# Patient Record
Sex: Male | Born: 1935 | Race: White | Hispanic: No | Marital: Married | Smoking: Current every day smoker
Health system: Southern US, Community
[De-identification: ages and names within clinical notes are randomized; demographics above are authoritative.]

## PROBLEM LIST (undated history)

## (undated) DIAGNOSIS — I219 Acute myocardial infarction, unspecified: Secondary | ICD-10-CM

## (undated) DIAGNOSIS — E039 Hypothyroidism, unspecified: Secondary | ICD-10-CM

## (undated) DIAGNOSIS — M549 Dorsalgia, unspecified: Secondary | ICD-10-CM

## (undated) DIAGNOSIS — A419 Sepsis, unspecified organism: Secondary | ICD-10-CM

## (undated) DIAGNOSIS — M779 Enthesopathy, unspecified: Secondary | ICD-10-CM

## (undated) DIAGNOSIS — G8929 Other chronic pain: Secondary | ICD-10-CM

## (undated) DIAGNOSIS — K219 Gastro-esophageal reflux disease without esophagitis: Secondary | ICD-10-CM

## (undated) DIAGNOSIS — L8990 Pressure ulcer of unspecified site, unspecified stage: Secondary | ICD-10-CM

## (undated) DIAGNOSIS — R1310 Dysphagia, unspecified: Secondary | ICD-10-CM

## (undated) DIAGNOSIS — M109 Gout, unspecified: Secondary | ICD-10-CM

## (undated) DIAGNOSIS — M898X9 Other specified disorders of bone, unspecified site: Secondary | ICD-10-CM

## (undated) DIAGNOSIS — F32A Depression, unspecified: Secondary | ICD-10-CM

## (undated) DIAGNOSIS — E119 Type 2 diabetes mellitus without complications: Secondary | ICD-10-CM

## (undated) DIAGNOSIS — R7989 Other specified abnormal findings of blood chemistry: Secondary | ICD-10-CM

## (undated) DIAGNOSIS — J189 Pneumonia, unspecified organism: Secondary | ICD-10-CM

## (undated) DIAGNOSIS — N179 Acute kidney failure, unspecified: Secondary | ICD-10-CM

## (undated) DIAGNOSIS — I10 Essential (primary) hypertension: Secondary | ICD-10-CM

## (undated) DIAGNOSIS — M21372 Foot drop, left foot: Secondary | ICD-10-CM

## (undated) DIAGNOSIS — G4700 Insomnia, unspecified: Secondary | ICD-10-CM

## (undated) DIAGNOSIS — D126 Benign neoplasm of colon, unspecified: Secondary | ICD-10-CM

## (undated) DIAGNOSIS — R296 Repeated falls: Secondary | ICD-10-CM

## (undated) DIAGNOSIS — R3129 Other microscopic hematuria: Secondary | ICD-10-CM

## (undated) DIAGNOSIS — G9340 Encephalopathy, unspecified: Secondary | ICD-10-CM

## (undated) DIAGNOSIS — F329 Major depressive disorder, single episode, unspecified: Secondary | ICD-10-CM

## (undated) DIAGNOSIS — B342 Coronavirus infection, unspecified: Secondary | ICD-10-CM

## (undated) DIAGNOSIS — E079 Disorder of thyroid, unspecified: Secondary | ICD-10-CM

## (undated) DIAGNOSIS — F419 Anxiety disorder, unspecified: Secondary | ICD-10-CM

## (undated) HISTORY — DX: Pressure ulcer of unspecified site, unspecified stage: L89.90

## (undated) HISTORY — PX: BACK SURGERY: SHX140

## (undated) HISTORY — PX: APPENDECTOMY: SHX54

## (undated) HISTORY — DX: Pneumonia, unspecified organism: J18.9

## (undated) HISTORY — DX: Encephalopathy, unspecified: G93.40

## (undated) HISTORY — DX: Acute kidney failure, unspecified: N17.9

## (undated) HISTORY — PX: CHOLECYSTECTOMY: SHX55

## (undated) HISTORY — DX: Sepsis, unspecified organism: A41.9

## (undated) HISTORY — PX: EYE SURGERY: SHX253

---

## 1997-09-09 ENCOUNTER — Encounter: Admission: RE | Admit: 1997-09-09 | Discharge: 1997-09-09 | Payer: Self-pay | Admitting: *Deleted

## 2003-11-11 ENCOUNTER — Ambulatory Visit: Payer: Self-pay | Admitting: Pain Medicine

## 2003-12-07 ENCOUNTER — Ambulatory Visit: Payer: Self-pay | Admitting: Physician Assistant

## 2004-01-07 ENCOUNTER — Ambulatory Visit: Payer: Self-pay | Admitting: Physician Assistant

## 2004-02-04 ENCOUNTER — Ambulatory Visit: Payer: Self-pay | Admitting: Physician Assistant

## 2004-03-08 ENCOUNTER — Ambulatory Visit: Payer: Self-pay | Admitting: Physician Assistant

## 2004-04-11 ENCOUNTER — Ambulatory Visit: Payer: Self-pay | Admitting: Physician Assistant

## 2004-05-09 ENCOUNTER — Ambulatory Visit: Payer: Self-pay | Admitting: Physician Assistant

## 2004-06-09 ENCOUNTER — Ambulatory Visit: Payer: Self-pay | Admitting: Physician Assistant

## 2004-07-07 ENCOUNTER — Ambulatory Visit: Payer: Self-pay | Admitting: Physician Assistant

## 2004-08-05 ENCOUNTER — Ambulatory Visit: Payer: Self-pay | Admitting: Physician Assistant

## 2004-09-06 ENCOUNTER — Ambulatory Visit: Payer: Self-pay | Admitting: Physician Assistant

## 2004-10-06 ENCOUNTER — Ambulatory Visit: Payer: Self-pay | Admitting: Physician Assistant

## 2004-11-08 ENCOUNTER — Ambulatory Visit: Payer: Self-pay | Admitting: Physician Assistant

## 2004-12-08 ENCOUNTER — Ambulatory Visit: Payer: Self-pay | Admitting: Physician Assistant

## 2005-01-02 ENCOUNTER — Ambulatory Visit: Payer: Self-pay | Admitting: Physician Assistant

## 2005-01-26 ENCOUNTER — Ambulatory Visit: Payer: Self-pay | Admitting: Physician Assistant

## 2005-03-07 ENCOUNTER — Ambulatory Visit: Payer: Self-pay | Admitting: Physician Assistant

## 2005-04-03 ENCOUNTER — Ambulatory Visit: Payer: Self-pay | Admitting: Physician Assistant

## 2005-05-02 ENCOUNTER — Ambulatory Visit: Payer: Self-pay | Admitting: Physician Assistant

## 2005-06-05 ENCOUNTER — Ambulatory Visit: Payer: Self-pay | Admitting: Physician Assistant

## 2005-07-04 ENCOUNTER — Ambulatory Visit: Payer: Self-pay | Admitting: Physician Assistant

## 2005-08-03 ENCOUNTER — Ambulatory Visit: Payer: Self-pay | Admitting: Physician Assistant

## 2005-08-31 ENCOUNTER — Ambulatory Visit: Payer: Self-pay | Admitting: Physician Assistant

## 2005-09-28 ENCOUNTER — Ambulatory Visit: Payer: Self-pay | Admitting: Physician Assistant

## 2005-10-30 ENCOUNTER — Ambulatory Visit: Payer: Self-pay | Admitting: Physician Assistant

## 2005-11-28 ENCOUNTER — Ambulatory Visit: Payer: Self-pay | Admitting: Physician Assistant

## 2005-12-25 ENCOUNTER — Ambulatory Visit: Payer: Self-pay | Admitting: Physician Assistant

## 2006-01-24 ENCOUNTER — Ambulatory Visit: Payer: Self-pay | Admitting: Physician Assistant

## 2006-02-27 ENCOUNTER — Ambulatory Visit: Payer: Self-pay | Admitting: Physician Assistant

## 2006-03-29 ENCOUNTER — Ambulatory Visit: Payer: Self-pay | Admitting: Physician Assistant

## 2006-04-26 ENCOUNTER — Ambulatory Visit: Payer: Self-pay | Admitting: Physician Assistant

## 2006-05-25 ENCOUNTER — Ambulatory Visit: Payer: Self-pay | Admitting: Internal Medicine

## 2006-05-25 ENCOUNTER — Other Ambulatory Visit: Payer: Self-pay

## 2006-05-28 ENCOUNTER — Ambulatory Visit: Payer: Self-pay | Admitting: Physician Assistant

## 2006-05-30 ENCOUNTER — Ambulatory Visit: Payer: Self-pay | Admitting: Unknown Physician Specialty

## 2006-06-28 ENCOUNTER — Ambulatory Visit: Payer: Self-pay | Admitting: Physician Assistant

## 2006-07-26 ENCOUNTER — Ambulatory Visit: Payer: Self-pay | Admitting: Physician Assistant

## 2006-08-27 ENCOUNTER — Ambulatory Visit: Payer: Self-pay | Admitting: Physician Assistant

## 2006-09-24 ENCOUNTER — Ambulatory Visit: Payer: Self-pay | Admitting: Physician Assistant

## 2006-10-17 ENCOUNTER — Ambulatory Visit: Payer: Self-pay | Admitting: Physician Assistant

## 2006-11-26 ENCOUNTER — Ambulatory Visit: Payer: Self-pay | Admitting: Physician Assistant

## 2006-12-24 ENCOUNTER — Ambulatory Visit: Payer: Self-pay | Admitting: Physician Assistant

## 2007-01-22 ENCOUNTER — Ambulatory Visit: Payer: Self-pay | Admitting: Physician Assistant

## 2007-04-23 ENCOUNTER — Ambulatory Visit: Payer: Self-pay | Admitting: Physician Assistant

## 2007-07-23 ENCOUNTER — Ambulatory Visit: Payer: Self-pay | Admitting: Physician Assistant

## 2007-10-22 ENCOUNTER — Ambulatory Visit: Payer: Self-pay | Admitting: Physician Assistant

## 2008-01-16 ENCOUNTER — Ambulatory Visit: Payer: Self-pay | Admitting: Physician Assistant

## 2008-04-15 ENCOUNTER — Ambulatory Visit: Payer: Self-pay | Admitting: Physician Assistant

## 2008-07-15 ENCOUNTER — Ambulatory Visit: Payer: Self-pay | Admitting: Physician Assistant

## 2008-09-07 ENCOUNTER — Emergency Department: Payer: Self-pay | Admitting: Internal Medicine

## 2008-10-13 ENCOUNTER — Ambulatory Visit: Payer: Self-pay | Admitting: Physician Assistant

## 2008-11-16 ENCOUNTER — Ambulatory Visit: Payer: Self-pay | Admitting: Pain Medicine

## 2008-11-25 ENCOUNTER — Ambulatory Visit: Payer: Self-pay | Admitting: Internal Medicine

## 2009-02-08 ENCOUNTER — Ambulatory Visit: Payer: Self-pay | Admitting: Pain Medicine

## 2009-02-15 ENCOUNTER — Ambulatory Visit: Payer: Self-pay | Admitting: Unknown Physician Specialty

## 2009-03-29 ENCOUNTER — Ambulatory Visit: Payer: Self-pay | Admitting: Unknown Physician Specialty

## 2009-04-12 ENCOUNTER — Ambulatory Visit: Payer: Self-pay | Admitting: Unknown Physician Specialty

## 2009-04-26 ENCOUNTER — Ambulatory Visit: Payer: Self-pay | Admitting: Unknown Physician Specialty

## 2009-05-10 ENCOUNTER — Ambulatory Visit: Payer: Self-pay | Admitting: Pain Medicine

## 2012-11-19 ENCOUNTER — Ambulatory Visit: Payer: Self-pay | Admitting: Internal Medicine

## 2012-12-07 ENCOUNTER — Ambulatory Visit: Payer: Self-pay | Admitting: Internal Medicine

## 2013-01-06 ENCOUNTER — Ambulatory Visit: Payer: Self-pay | Admitting: Internal Medicine

## 2014-02-06 DIAGNOSIS — R3129 Other microscopic hematuria: Secondary | ICD-10-CM

## 2014-02-06 HISTORY — DX: Other microscopic hematuria: R31.29

## 2014-12-07 ENCOUNTER — Emergency Department: Payer: Medicare Other

## 2014-12-07 ENCOUNTER — Inpatient Hospital Stay
Admission: EM | Admit: 2014-12-07 | Discharge: 2014-12-09 | DRG: 871 | Disposition: A | Discharge status: Home / Self Care | Payer: Medicare Other | Attending: Internal Medicine | Admitting: Internal Medicine

## 2014-12-07 DIAGNOSIS — Z79899 Other long term (current) drug therapy: Secondary | ICD-10-CM | POA: Diagnosis not present

## 2014-12-07 DIAGNOSIS — E860 Dehydration: Secondary | ICD-10-CM | POA: Diagnosis present

## 2014-12-07 DIAGNOSIS — Z882 Allergy status to sulfonamides status: Secondary | ICD-10-CM

## 2014-12-07 DIAGNOSIS — N179 Acute kidney failure, unspecified: Secondary | ICD-10-CM | POA: Diagnosis present

## 2014-12-07 DIAGNOSIS — M109 Gout, unspecified: Secondary | ICD-10-CM | POA: Diagnosis present

## 2014-12-07 DIAGNOSIS — E039 Hypothyroidism, unspecified: Secondary | ICD-10-CM | POA: Diagnosis present

## 2014-12-07 DIAGNOSIS — J189 Pneumonia, unspecified organism: Secondary | ICD-10-CM

## 2014-12-07 DIAGNOSIS — R4781 Slurred speech: Secondary | ICD-10-CM

## 2014-12-07 DIAGNOSIS — G9341 Metabolic encephalopathy: Secondary | ICD-10-CM | POA: Diagnosis present

## 2014-12-07 DIAGNOSIS — E119 Type 2 diabetes mellitus without complications: Secondary | ICD-10-CM | POA: Diagnosis present

## 2014-12-07 DIAGNOSIS — A419 Sepsis, unspecified organism: Principal | ICD-10-CM | POA: Diagnosis present

## 2014-12-07 DIAGNOSIS — Z8249 Family history of ischemic heart disease and other diseases of the circulatory system: Secondary | ICD-10-CM

## 2014-12-07 DIAGNOSIS — K219 Gastro-esophageal reflux disease without esophagitis: Secondary | ICD-10-CM | POA: Diagnosis present

## 2014-12-07 DIAGNOSIS — L8990 Pressure ulcer of unspecified site, unspecified stage: Secondary | ICD-10-CM | POA: Diagnosis present

## 2014-12-07 DIAGNOSIS — F17200 Nicotine dependence, unspecified, uncomplicated: Secondary | ICD-10-CM | POA: Diagnosis present

## 2014-12-07 DIAGNOSIS — Z833 Family history of diabetes mellitus: Secondary | ICD-10-CM | POA: Diagnosis not present

## 2014-12-07 DIAGNOSIS — I639 Cerebral infarction, unspecified: Secondary | ICD-10-CM | POA: Diagnosis not present

## 2014-12-07 DIAGNOSIS — G9340 Encephalopathy, unspecified: Secondary | ICD-10-CM | POA: Diagnosis present

## 2014-12-07 HISTORY — DX: Gout, unspecified: M10.9

## 2014-12-07 HISTORY — DX: Disorder of thyroid, unspecified: E07.9

## 2014-12-07 HISTORY — DX: Dorsalgia, unspecified: M54.9

## 2014-12-07 HISTORY — DX: Gastro-esophageal reflux disease without esophagitis: K21.9

## 2014-12-07 HISTORY — DX: Insomnia, unspecified: G47.00

## 2014-12-07 HISTORY — DX: Pneumonia, unspecified organism: J18.9

## 2014-12-07 HISTORY — DX: Type 2 diabetes mellitus without complications: E11.9

## 2014-12-07 HISTORY — DX: Anxiety disorder, unspecified: F41.9

## 2014-12-07 LAB — CBC WITH DIFFERENTIAL/PLATELET
Basophils Absolute: 0.1 10*3/uL (ref 0–0.1)
Basophils Relative: 0 %
Eosinophils Absolute: 0.3 10*3/uL (ref 0–0.7)
Eosinophils Relative: 2 %
HCT: 51.7 % (ref 40.0–52.0)
Hemoglobin: 17.6 g/dL (ref 13.0–18.0)
Lymphocytes Relative: 4 %
Lymphs Abs: 0.8 10*3/uL — ABNORMAL LOW (ref 1.0–3.6)
MCH: 30.9 pg (ref 26.0–34.0)
MCHC: 34 g/dL (ref 32.0–36.0)
MCV: 90.8 fL (ref 80.0–100.0)
MONO ABS: 0.8 10*3/uL (ref 0.2–1.0)
Monocytes Relative: 5 %
Neutro Abs: 16.5 10*3/uL — ABNORMAL HIGH (ref 1.4–6.5)
Neutrophils Relative %: 89 %
Platelets: 157 10*3/uL (ref 150–440)
RBC: 5.7 MIL/uL (ref 4.40–5.90)
RDW: 14.2 % (ref 11.5–14.5)
WBC: 18.5 10*3/uL — ABNORMAL HIGH (ref 3.8–10.6)

## 2014-12-07 LAB — COMPREHENSIVE METABOLIC PANEL
ALT: 43 U/L (ref 17–63)
AST: 60 U/L — AB (ref 15–41)
Albumin: 4.1 g/dL (ref 3.5–5.0)
Alkaline Phosphatase: 145 U/L — ABNORMAL HIGH (ref 38–126)
Anion gap: 10 (ref 5–15)
BUN: 30 mg/dL — AB (ref 6–20)
CHLORIDE: 101 mmol/L (ref 101–111)
CO2: 31 mmol/L (ref 22–32)
CREATININE: 1.38 mg/dL — AB (ref 0.61–1.24)
Calcium: 9.3 mg/dL (ref 8.9–10.3)
GFR calc Af Amer: 55 mL/min — ABNORMAL LOW (ref >60)
GFR calc non Af Amer: 47 mL/min — ABNORMAL LOW (ref >60)
GLUCOSE: 145 mg/dL — AB (ref 65–99)
POTASSIUM: 4.5 mmol/L (ref 3.5–5.1)
Sodium: 142 mmol/L (ref 135–145)
Total Bilirubin: 1.1 mg/dL (ref 0.3–1.2)
Total Protein: 7.8 g/dL (ref 6.5–8.1)

## 2014-12-07 LAB — TROPONIN I: Troponin I: 0.03 ng/mL (ref <0.031)

## 2014-12-07 MED ORDER — RISPERIDONE 0.25 MG PO TABS
0.2500 mg | ORAL_TABLET | Freq: Every day | ORAL | Status: DC
  Administered 2014-12-08 (×2): 0.25 mg via ORAL
  Filled 2014-12-07 (×3): qty 1

## 2014-12-07 MED ORDER — LEVOTHYROXINE SODIUM 75 MCG PO TABS
150.0000 ug | ORAL_TABLET | Freq: Every morning | ORAL | Status: DC
Start: 2014-12-08 — End: 2014-12-09
  Administered 2014-12-08 – 2014-12-09 (×2): 150 ug via ORAL
  Filled 2014-12-07 (×2): qty 2

## 2014-12-07 MED ORDER — LEVALBUTEROL HCL 1.25 MG/0.5ML IN NEBU
1.2500 mg | INHALATION_SOLUTION | Freq: Four times a day (QID) | RESPIRATORY_TRACT | Status: DC | PRN
  Filled 2014-12-07: qty 0.5

## 2014-12-07 MED ORDER — INSULIN ASPART 100 UNIT/ML ~~LOC~~ SOLN
0.0000 [IU] | Freq: Three times a day (TID) | SUBCUTANEOUS | Status: DC
  Administered 2014-12-08 – 2014-12-09 (×2): 1 [IU] via SUBCUTANEOUS
  Filled 2014-12-07 (×2): qty 1

## 2014-12-07 MED ORDER — GABAPENTIN 600 MG PO TABS
600.0000 mg | ORAL_TABLET | Freq: Four times a day (QID) | ORAL | Status: DC
  Administered 2014-12-08 – 2014-12-09 (×7): 600 mg via ORAL
  Filled 2014-12-07 (×8): qty 1

## 2014-12-07 MED ORDER — ALLOPURINOL 100 MG PO TABS
100.0000 mg | ORAL_TABLET | Freq: Every day | ORAL | Status: DC
  Administered 2014-12-08 – 2014-12-09 (×2): 100 mg via ORAL
  Filled 2014-12-07 (×2): qty 1

## 2014-12-07 MED ORDER — MIRTAZAPINE 15 MG PO TABS
45.0000 mg | ORAL_TABLET | Freq: Every day | ORAL | Status: DC
  Administered 2014-12-08 (×2): 45 mg via ORAL
  Filled 2014-12-07 (×2): qty 3

## 2014-12-07 MED ORDER — SODIUM CHLORIDE 0.9 % IV SOLN
INTRAVENOUS | Status: DC
  Administered 2014-12-08 – 2014-12-09 (×2): via INTRAVENOUS

## 2014-12-07 MED ORDER — ENOXAPARIN SODIUM 40 MG/0.4ML ~~LOC~~ SOLN
40.0000 mg | SUBCUTANEOUS | Status: DC
  Administered 2014-12-08 (×2): 40 mg via SUBCUTANEOUS
  Filled 2014-12-07 (×2): qty 0.4

## 2014-12-07 MED ORDER — INSULIN ASPART 100 UNIT/ML ~~LOC~~ SOLN: 0.0000 [IU] | Freq: Every day | SUBCUTANEOUS | Status: DC

## 2014-12-07 MED ORDER — SODIUM CHLORIDE 0.9 % IV BOLUS (SEPSIS)
1000.0000 mL | Freq: Once | INTRAVENOUS | Status: AC
  Administered 2014-12-08: 1000 mL via INTRAVENOUS

## 2014-12-07 MED ORDER — LEVOFLOXACIN IN D5W 750 MG/150ML IV SOLN
750.0000 mg | INTRAVENOUS | Status: DC
  Filled 2014-12-07: qty 150

## 2014-12-07 MED ORDER — METHADONE HCL 10 MG PO TABS
10.0000 mg | ORAL_TABLET | Freq: Three times a day (TID) | ORAL | Status: DC | PRN
  Administered 2014-12-08: 10 mg via ORAL
  Filled 2014-12-07: qty 1

## 2014-12-07 NOTE — ED Notes (Signed)
Tele neurologist consult now. Wife at bedside.

## 2014-12-07 NOTE — H&P (Addendum)
[place] [facility] [place] NAME: Chase Mendez    MR#:  [id number]  DATE OF BIRTH:  [birth year]  DATE OF ADMISSION:  12/07/2014  PRIMARY CARE PHYSICIAN: No primary care provider on file.   REQUESTING/REFERRING PHYSICIAN: Nance Pear, MD  CHIEF COMPLAINT:   Chief Complaint  Patient presents with  . Altered Mental Status   Confusion today. HISTORY OF PRESENT ILLNESS:  Chase Mendez  is a 80 y.o. male with a known history of DM, back pain, hypothyroidism. The patient was brought to the ED due to confusion one hour prior to the patient's arrival E ED this afternoon. The patient is confused and unable to provide detailed information. According to patient's wife, patient has had a cough for the past 2 weeks and a worsening for the past 2 days. The patient was found confusion, weakness and the mumbling speech this afternoon and then was sent to the ED for further evaluation. The patient denies any symptoms. WBC is 18.5. Chest x-ray show left sided pneumonia. He was treated with Levaquin in the ED.  PAST MEDICAL HISTORY:   Past Medical History  Diagnosis Date  . Gout   . Back pain   . Diabetes mellitus without complication ([place])   . Thyroid disease   . GERD (gastroesophageal reflux disease)   . Anxiety   . Insomnia     PAST SURGICAL HISTORY:   Past Surgical History  Procedure Laterality Date  . Cholecystectomy    . Appendectomy    . Back surgery    . Eye surgery      SOCIAL HISTORY:   Social History  Substance Use Topics  . Smoking status: Current Every Day Smoker    Types: Cigars  . Smokeless tobacco: Never Used  . Alcohol Use: No    FAMILY HISTORY:   Family History  Problem Relation Age of Onset  . Heart disease Mother   . Diabetes Father     DRUG ALLERGIES:   Allergies  Allergen Reactions  . Sulfa Antibiotics     REVIEW OF SYSTEMS:  CONSTITUTIONAL: No fever, fatigue or weakness.  EYES: No blurred or  double vision.  EARS, NOSE, AND THROAT: No tinnitus or ear pain.  RESPIRATORY: No cough, shortness of breath, wheezing or hemoptysis.  CARDIOVASCULAR: No chest pain, orthopnea, edema.  GASTROINTESTINAL: No nausea, vomiting, diarrhea or abdominal pain.  GENITOURINARY: No dysuria, hematuria.  ENDOCRINE: No polyuria, nocturia,  HEMATOLOGY: No anemia, easy bruising or bleeding SKIN: No rash or lesion. MUSCULOSKELETAL: No joint pain or arthritis.   NEUROLOGIC: No tingling, numbness, weakness.  PSYCHIATRY: No anxiety or depression.   MEDICATIONS AT HOME:   Prior to Admission medications   Medication Sig Start Date End Date Taking? Authorizing Provider  allopurinol (ZYLOPRIM) 100 MG tablet Take 100 mg by mouth daily. 10/26/14  Yes Historical Provider, MD  gabapentin (NEURONTIN) 600 MG tablet Take 600 mg by mouth 4 (four) times daily. 11/26/14  Yes Historical Provider, MD  levothyroxine (SYNTHROID, LEVOTHROID) 150 MCG tablet Take 150 mcg by mouth every morning. 09/12/14  Yes Historical Provider, MD  metFORMIN (GLUCOPHAGE) 500 MG tablet Take 500 mg by mouth every evening. 09/18/14  Yes Historical Provider, MD  methadone (DOLOPHINE) 10 MG tablet Take 1 tablet by mouth every 8 (eight) hours as needed. 11/28/14  Yes Historical Provider, MD  mirtazapine (REMERON) 45 MG tablet Take 1 tablet by mouth at bedtime. 11/26/14  Yes Historical Provider, MD  naproxen (NAPROSYN) 500 MG  tablet Take 1 tablet by mouth 2 (two) times daily. 11/24/14  Yes Historical Provider, MD  risperiDONE (RISPERDAL) 0.25 MG tablet Take 0.25 mg by mouth at bedtime. 11/26/14  Yes Historical Provider, MD      VITAL SIGNS:  Blood pressure 110/61, pulse 89, temperature 98 F (36.7 C), temperature source Oral, resp. rate 26, height 6\' 1"  (1.854 m), SpO2 91 %.  PHYSICAL EXAMINATION:  GENERAL:  80 y.o.-year-old patient lying in the bed with no acute distress.  EYES: Pupils equal, round, reactive to light and accommodation. No scleral  icterus. Extraocular muscles intact.  HEENT: Head atraumatic, normocephalic. Oropharynx and nasopharynx clear. Dry oral mucosa. NECK:  Supple, no jugular venous distention. No thyroid enlargement, no tenderness.  LUNGS: Coarse breath sounds bilaterally, bilateral basilar crackles. No use of accessory muscles of respiration.  CARDIOVASCULAR: S1, S2 normal. No murmurs, rubs, or gallops.  ABDOMEN: Soft, nontender, nondistended. Bowel sounds present. No organomegaly or mass.  EXTREMITIES: No pedal edema, cyanosis, or clubbing.  NEUROLOGIC: Cranial nerves II through XII are intact. Muscle strength 5/5 in all extremities. Sensation intact. Gait not checked.  PSYCHIATRIC: The patient is alert and oriented x 2-3.  SKIN: No obvious rash, lesion, or ulcer.   LABORATORY PANEL:   CBC  Recent Labs Lab 12/07/14 2117  WBC 18.5*  HGB 17.6  HCT 51.7  PLT 157   ------------------------------------------------------------------------------------------------------------------  Chemistries   Recent Labs Lab 12/07/14 2117  NA 142  K 4.5  CL 101  CO2 31  GLUCOSE 145*  BUN 30*  CREATININE 1.38*  CALCIUM 9.3  AST 60*  ALT 43  ALKPHOS 145*  BILITOT 1.1   ------------------------------------------------------------------------------------------------------------------  Cardiac Enzymes  Recent Labs Lab 12/07/14 2117  TROPONINI 0.03   ------------------------------------------------------------------------------------------------------------------  RADIOLOGY:  Dg Chest 1 View  12/07/2014  CLINICAL DATA:  Cough.  Altered mental status. EXAM: CHEST 1 VIEW COMPARISON:  CT scan of the chest dated 04/12/2009 and chest x-ray dated 11/25/2008 FINDINGS: There is a patchy area of infiltrate at the left base. The lungs are otherwise clear. Heart size and vascularity are normal. No significant osseous abnormality. IMPRESSION: Small patchy area of infiltrate at the left lung base. Electronically  Signed   By: Lorriane Shire M.D.   On: 12/07/2014 21:42   Ct Head Wo Contrast  12/07/2014  CLINICAL DATA:  Initial evaluation for acute onset confusion. EXAM: CT HEAD WITHOUT CONTRAST TECHNIQUE: Contiguous axial images were obtained from the base of the skull through the vertex without intravenous contrast. COMPARISON:  Prior study 09/07/2008. FINDINGS: Diffuse prominence of the CSF containing spaces compatible with generalized age-related cerebral atrophy. Mild chronic small vessel ischemic type changes present within the periventricular and deep white matter. These changes are mildly progressed relative to 2010. Scattered vascular calcifications present within the carotid siphons and distal vertebral arteries. No acute large vessel territory infarct. No intracranial hemorrhage. No mass lesion, midline shift, or mass effect. No hydrocephalus. No extra-axial fluid collection. Scalp soft tissues demonstrate no acute abnormality. Orbits within normal limits. Minimal opacity seen layering within the left maxillary sinus. Mild scattered opacity within the ethmoidal air cells. Paranasal sinuses are otherwise clear. No mastoid effusion. Calvarium intact. IMPRESSION: 1. No acute intracranial process. 2. Mild generalized cerebral atrophy with chronic small vessel ischemic disease, progressed relative to 2010. Electronically Signed   By: Jeannine Boga M.D.   On: 12/07/2014 21:51    EKG:   Orders placed or performed in visit on 09/07/08  . EKG 12-Lead  IMPRESSION AND PLAN:   Acute metabolic encephalopathy Sepsis Left lower lobe pneumonia (CAP) Acute renal failure with dehydration Diabetes 2 Tobacco abuse  The patient will be admitted to medical floor with telemetry monitor. I will continue Levaquin, follow-up CBC, sputum culture and blood cultures. Give nebulizer treatment when necessary and oxygen by nasal cannular. Acute renal failure with dehydration, hold metformin and start normal saline  IV, follow-up BMP. Acute encephalopathy, tele-neurologist doubted the patient has stroke or seizure but recommended an MRI of the brain, echocardiogram, MRA of the neck and EEG. I will get an MRI of the brain and echocardiograph. If I MRI of the brain is positive for stroke, we need to get an MRA of the neck, EEG and neurology consult. I will start neuro check and get speech study. For diabetes, I will start sliding scale and hold metformin. Smoking cessation was counseled for 3-4 minutes and will give nicotine patch.  All the records are reviewed and case discussed with ED provider. Management plans discussed with the patient, his wife and they are in agreement.  CODE STATUS: Full code.  TOTAL TIME TAKING CARE OF THIS PATIENT:62 minutes.    Demetrios Loll M.D on 12/07/2014 at 11:57 PM  Between 7am to 6pm - Pager - [phone]  After 6pm go to [email or web address] - password [email or web address]  [facility]  Office  [phone]  CC: Primary care physician; No primary care provider on file.

## 2014-12-07 NOTE — ED Notes (Signed)
Dr. Charlesetta Ivory has completed consult.

## 2014-12-07 NOTE — ED Notes (Signed)
Patient's wife states sudden onset of confusion about one hour ago.

## 2014-12-07 NOTE — ED Provider Notes (Signed)
[facility] Emergency Department Provider Note    ____________________________________________  Time seen: 2105  I have reviewed the triage vital signs and the nursing notes.   HISTORY  Chief Complaint Altered Mental Status   History limited by: Altered Mental Status   HPI Chase Mendez is a 80 y.o. male who presented to the emergency department today because of concerns for altered mental status. The wife states that roughly 1 hour prior to the patient's arrival she noted him to be Very confused. She states that for the past couple of days he has had increased cough and perhaps some respiratory distress. Earlier today he had been doing okay mentally however when he came back inside after being out of the house he was very confused. The patient himself is not able to give history.   Past Medical History  Diagnosis Date  . Gout   . Back pain   . Diabetes mellitus without complication ([place])   . Thyroid disease   . GERD (gastroesophageal reflux disease)   . Anxiety   . Insomnia     There are no active problems to display for this patient.   Past Surgical History  Procedure Laterality Date  . Cholecystectomy    . Appendectomy    . Back surgery    . Eye surgery      Current Outpatient Rx  Name  Route  Sig  Dispense  Refill  . allopurinol (ZYLOPRIM) 100 MG tablet   Oral   Take 100 mg by mouth daily.      0   . gabapentin (NEURONTIN) 600 MG tablet   Oral   Take 600 mg by mouth 4 (four) times daily.      0   . levothyroxine (SYNTHROID, LEVOTHROID) 150 MCG tablet   Oral   Take 150 mcg by mouth every morning.      0   . metFORMIN (GLUCOPHAGE) 500 MG tablet   Oral   Take 500 mg by mouth every evening.      0   . methadone (DOLOPHINE) 10 MG tablet   Oral   Take 1 tablet by mouth every 8 (eight) hours as needed.      0   . mirtazapine (REMERON) 45 MG tablet   Oral   Take 1 tablet by mouth at bedtime.      0   . naproxen  (NAPROSYN) 500 MG tablet   Oral   Take 1 tablet by mouth 2 (two) times daily.      0   . risperiDONE (RISPERDAL) 0.25 MG tablet   Oral   Take 0.25 mg by mouth at bedtime.      0     Allergies Sulfa antibiotics  No family history on file.  Social History Social History  Substance Use Topics  . Smoking status: Current Every Day Smoker  . Smokeless tobacco: Never Used  . Alcohol Use: No    Review of Systems Unable to obtain secondary to altered mental status  ____________________________________________   PHYSICAL EXAM:  VITAL SIGNS:   98 F (36.7 C)  --  18   172/71 mmHg   88 %     Constitutional: Awake and alert. Not oriented to year or events. Eyes: Conjunctivae are normal. PERRL. Normal extraocular movements. ENT   Head: Normocephalic and atraumatic.   Nose: No congestion/rhinnorhea.   Mouth/Throat: Mucous membranes are moist.   Neck: No stridor. Hematological/Lymphatic/Immunilogical: No cervical lymphadenopathy. Cardiovascular: Normal rate, regular rhythm.  No murmurs, rubs, or gallops. Respiratory: Normal respiratory effort without tachypnea nor retractions. Breath sounds are clear and equal bilaterally. No wheezes/rales/rhonchi. Gastrointestinal: Soft and nontender. No distention.  Genitourinary: Deferred Musculoskeletal: Normal range of motion in all extremities. No joint effusions.  No lower extremity tenderness nor edema. Neurologic:  Speech is somewhat slurred. Patient not completely oriented. Appears to be moving all extremities. Unable to appropriately answer many questions. Skin:  Skin is warm, dry and intact. No rash noted. Psychiatric: Mood and affect are normal. Speech and behavior are normal. Patient exhibits appropriate insight and judgment.  ____________________________________________    LABS (pertinent positives/negatives)  Labs Reviewed  CBC WITH DIFFERENTIAL/PLATELET - Abnormal; Notable for the following:    WBC 18.5  (*)    Neutro Abs 16.5 (*)    Lymphs Abs 0.8 (*)    All other components within normal limits  COMPREHENSIVE METABOLIC PANEL - Abnormal; Notable for the following:    Glucose, Bld 145 (*)    BUN 30 (*)    Creatinine, Ser 1.38 (*)    AST 60 (*)    Alkaline Phosphatase 145 (*)    GFR calc non Af Amer 47 (*)    GFR calc Af Amer 55 (*)    All other components within normal limits  CULTURE, BLOOD (ROUTINE X 2)  CULTURE, BLOOD (ROUTINE X 2)  CULTURE, EXPECTORATED SPUTUM-ASSESSMENT  GRAM STAIN  TROPONIN I  LACTIC ACID, PLASMA  LACTIC ACID, PLASMA  URINALYSIS COMPLETEWITH MICROSCOPIC ([facility] ONLY)  LEGIONELLA PNEUMOPHILA SEROGP 1 UR AG  STREP PNEUMONIAE URINARY ANTIGEN  HEMOGLOBIN A1C  CBC  BASIC METABOLIC PANEL    ____________________________________________   EKG  I, Nance Pear, attending physician, personally viewed and interpreted this EKG  EKG Time: 2100 Rate: 102 Rhythm: sinus tachycardia Axis: left axis deviation Intervals: qtc 433 QRS: narrow ST changes: no st elevation Impression: sinus tachycardia, abnormal ekg ____________________________________________    RADIOLOGY  CXR  IMPRESSION: Small patchy area of infiltrate at the left lung base.  CT Head IMPRESSION: 1. No acute intracranial process. 2. Mild generalized cerebral atrophy with chronic small vessel ischemic disease, progressed relative to 2010. ____________________________________________   PROCEDURES  Procedure(s) performed: None  Critical Care performed: Yes, see critical care note(s)  CRITICAL CARE Performed by: Nance Pear   Total critical care time: 30 minutes  Critical care time was exclusive of separately billable procedures and treating other patients.  Critical care was necessary to treat or prevent imminent or life-threatening deterioration.  Critical care was time spent personally by me on the following activities: development of treatment plan with patient  and/or surrogate as well as nursing, discussions with consultants, evaluation of patient's response to treatment, examination of patient, obtaining history from patient or surrogate, ordering and performing treatments and interventions, ordering and review of laboratory studies, ordering and review of radiographic studies, pulse oximetry and re-evaluation of patient's condition.  ____________________________________________   INITIAL IMPRESSION / ASSESSMENT AND PLAN / ED COURSE  Pertinent labs & imaging results that were available during my care of the patient were reviewed by me and considered in my medical decision making (see chart for details).  Patient presented to the emergency department today because of concerns of sudden onset altered mental status and difficulty with speech. On exam patient not oriented to time or place. He is unable to give any history. He does seem to be moving all extremities. Head CT without any acute findings. I did have neurology on call see the patient to  evaluate for possible acute stroke. He did not recommend TPA at this time after discussion with the patient and family. It is certainly possible that the altered mental status is secondary to infection. Patient has a leukocytosis and possible pneumonia on chest x-ray. He will be treated with antibiotics. We will admit to the hospitalist service for further workup and evaluation. ____________________________________________   FINAL CLINICAL IMPRESSION(S) / ED DIAGNOSES  Final diagnoses:  Community acquired pneumonia   altered mental status   Nance Pear, MD 12/07/14 2350

## 2014-12-08 ENCOUNTER — Inpatient Hospital Stay (HOSPITAL_COMMUNITY)
Admit: 2014-12-08 | Discharge: 2014-12-08 | Disposition: A | Discharge status: Home / Self Care | Payer: Medicare Other | Attending: Internal Medicine | Admitting: Internal Medicine

## 2014-12-08 DIAGNOSIS — N179 Acute kidney failure, unspecified: Secondary | ICD-10-CM | POA: Diagnosis present

## 2014-12-08 DIAGNOSIS — A419 Sepsis, unspecified organism: Secondary | ICD-10-CM | POA: Diagnosis present

## 2014-12-08 DIAGNOSIS — G9340 Encephalopathy, unspecified: Secondary | ICD-10-CM | POA: Diagnosis present

## 2014-12-08 DIAGNOSIS — I639 Cerebral infarction, unspecified: Secondary | ICD-10-CM

## 2014-12-08 DIAGNOSIS — Z79899 Other long term (current) drug therapy: Secondary | ICD-10-CM

## 2014-12-08 HISTORY — DX: Encephalopathy, unspecified: G93.40

## 2014-12-08 HISTORY — DX: Acute kidney failure, unspecified: N17.9

## 2014-12-08 HISTORY — DX: Sepsis, unspecified organism: A41.9

## 2014-12-08 LAB — BASIC METABOLIC PANEL
ANION GAP: 8 (ref 5–15)
BUN: 28 mg/dL — AB (ref 6–20)
CHLORIDE: 106 mmol/L (ref 101–111)
CO2: 26 mmol/L (ref 22–32)
Calcium: 8.7 mg/dL — ABNORMAL LOW (ref 8.9–10.3)
Creatinine, Ser: 1.34 mg/dL — ABNORMAL HIGH (ref 0.61–1.24)
GFR calc Af Amer: 57 mL/min — ABNORMAL LOW (ref >60)
GFR, EST NON AFRICAN AMERICAN: 49 mL/min — AB (ref >60)
GLUCOSE: 138 mg/dL — AB (ref 65–99)
POTASSIUM: 4.6 mmol/L (ref 3.5–5.1)
SODIUM: 140 mmol/L (ref 135–145)

## 2014-12-08 LAB — LACTIC ACID, PLASMA
LACTIC ACID, VENOUS: 2.2 mmol/L — AB (ref 0.5–2.0)
Lactic Acid, Venous: 2.3 mmol/L (ref 0.5–2.0)
Lactic Acid, Venous: 1.1 mmol/L (ref 0.5–2.0)

## 2014-12-08 LAB — CBC
HEMATOCRIT: 49.2 % (ref 40.0–52.0)
HEMOGLOBIN: 16.5 g/dL (ref 13.0–18.0)
MCH: 30.5 pg (ref 26.0–34.0)
MCHC: 33.5 g/dL (ref 32.0–36.0)
MCV: 91.2 fL (ref 80.0–100.0)
PLATELETS: 158 10*3/uL (ref 150–440)
RBC: 5.39 MIL/uL (ref 4.40–5.90)
RDW: 14 % (ref 11.5–14.5)
WBC: 30 10*3/uL — ABNORMAL HIGH (ref 3.8–10.6)

## 2014-12-08 LAB — URINALYSIS COMPLETE WITH MICROSCOPIC (ARMC ONLY)
BILIRUBIN URINE: NEGATIVE
Glucose, UA: NEGATIVE mg/dL
HGB URINE DIPSTICK: NEGATIVE
Ketones, ur: NEGATIVE mg/dL
LEUKOCYTES UA: NEGATIVE
Nitrite: NEGATIVE
PH: 5 (ref 5.0–8.0)
PROTEIN: NEGATIVE mg/dL
SQUAMOUS EPITHELIAL / LPF: NONE SEEN
Specific Gravity, Urine: 1.018 (ref 1.005–1.030)

## 2014-12-08 LAB — GLUCOSE, CAPILLARY
GLUCOSE-CAPILLARY: 148 mg/dL — AB (ref 65–99)
GLUCOSE-CAPILLARY: 116 mg/dL — AB (ref 65–99)
GLUCOSE-CAPILLARY: 117 mg/dL — AB (ref 65–99)
Glucose-Capillary: 139 mg/dL — ABNORMAL HIGH (ref 65–99)
Glucose-Capillary: 102 mg/dL — ABNORMAL HIGH (ref 65–99)

## 2014-12-08 LAB — HEMOGLOBIN A1C: HEMOGLOBIN A1C: 6.3 % — AB (ref 4.0–6.0)

## 2014-12-08 LAB — LIPID PANEL
CHOL/HDL RATIO: 3 ratio
CHOLESTEROL: 121 mg/dL (ref 0–200)
HDL: 40 mg/dL — AB (ref >40)
LDL Cholesterol: 58 mg/dL (ref 0–99)
Triglycerides: 115 mg/dL (ref <150)
VLDL: 23 mg/dL (ref 0–40)

## 2014-12-08 LAB — EXPECTORATED SPUTUM ASSESSMENT W REFEX TO RESP CULTURE

## 2014-12-08 LAB — EXPECTORATED SPUTUM ASSESSMENT W GRAM STAIN, RFLX TO RESP C

## 2014-12-08 MED ORDER — LEVOFLOXACIN IN D5W 750 MG/150ML IV SOLN
750.0000 mg | INTRAVENOUS | Status: DC
  Administered 2014-12-09: 750 mg via INTRAVENOUS
  Filled 2014-12-08 (×2): qty 150

## 2014-12-08 MED ORDER — LEVOFLOXACIN IN D5W 750 MG/150ML IV SOLN
750.0000 mg | INTRAVENOUS | Status: DC
  Administered 2014-12-08: 750 mg via INTRAVENOUS
  Filled 2014-12-08: qty 150

## 2014-12-08 MED ORDER — ATORVASTATIN CALCIUM 20 MG PO TABS
40.0000 mg | ORAL_TABLET | Freq: Every day | ORAL | Status: DC
  Administered 2014-12-08: 40 mg via ORAL
  Filled 2014-12-08: qty 2

## 2014-12-08 MED ORDER — ASPIRIN 81 MG PO CHEW
81.0000 mg | CHEWABLE_TABLET | Freq: Every day | ORAL | Status: DC
  Administered 2014-12-08 – 2014-12-09 (×2): 81 mg via ORAL
  Filled 2014-12-08 (×2): qty 1

## 2014-12-08 MED ORDER — METHADONE HCL 10 MG PO TABS
10.0000 mg | ORAL_TABLET | Freq: Three times a day (TID) | ORAL | Status: DC
  Administered 2014-12-08 – 2014-12-09 (×4): 10 mg via ORAL
  Filled 2014-12-08 (×4): qty 1

## 2014-12-08 MED ORDER — NICOTINE 14 MG/24HR TD PT24
14.0000 mg | MEDICATED_PATCH | Freq: Every day | TRANSDERMAL | Status: DC
  Administered 2014-12-08 – 2014-12-09 (×2): 14 mg via TRANSDERMAL
  Filled 2014-12-08 (×2): qty 1

## 2014-12-08 NOTE — Progress Notes (Signed)
ANTIBIOTIC CONSULT NOTE - INITIAL  Pharmacy Consult for Levaquin dosing Indication: CAP  Allergies  Allergen Reactions  . Sulfa Antibiotics     Patient Measurements: Height: 6\' 1"  (185.4 cm) Weight: 198 lb (89.812 kg) IBW/kg (Calculated) : 79.9 Adjusted Body Weight: n/a  Vital Signs: Temp: 99.9 F (37.7 C) (11/01 0444) Temp Source: Oral (11/01 0444) BP: 116/52 mmHg (11/01 0444) Pulse Rate: 74 (11/01 0444)  Labs:  Recent Labs  12/07/14 2117 12/08/14 0201  WBC 18.5* 30.0*  HGB 17.6 16.5  PLT 157 158  CREATININE 1.38* 1.34*   Estimated Creatinine Clearance: 51.3 mL/min (by C-G formula based on Cr of 1.34). No results for input(s): VANCOTROUGH, VANCOPEAK, VANCORANDOM, GENTTROUGH, GENTPEAK, GENTRANDOM, TOBRATROUGH, TOBRAPEAK, TOBRARND, AMIKACINPEAK, AMIKACINTROU, AMIKACIN in the last 72 hours.   Microbiology: Recent Results (from the past 720 hour(s))  Blood culture (routine x 2)     Status: None (Preliminary result)   Collection Time: 12/07/14  9:17 PM  Result Value Ref Range Status   Specimen Description BLOOD LEFT ANTECUBITAL  Final   Special Requests BOTTLES DRAWN AEROBIC AND ANAEROBIC 5CC  Final   Culture NO GROWTH < 12 HOURS  Final   Report Status PENDING  Incomplete  Blood culture (routine x 2)     Status: None (Preliminary result)   Collection Time: 12/07/14  9:19 PM  Result Value Ref Range Status   Specimen Description BLOOD RIGHT ANTECUBITAL  Final   Special Requests BOTTLES DRAWN AEROBIC AND ANAEROBIC 10CC  Final   Culture NO GROWTH < 12 HOURS  Final   Report Status PENDING  Incomplete    Assessment: Pharmacy consulted to dose levaquin for CAP in this 80 year old male.  Plan:  Current orders for levaquin 750mg  IV Q48H. Renal function improved, will change to levaquin 750mg  IV Q24H.   Pharmacy to follow per consult.  Rexene Edison, PharmD Clinical Pharmacist  12/08/2014 10:25 AM

## 2014-12-08 NOTE — Progress Notes (Signed)
Pharmacy Note - Medication Reconciliation  Patient with orders for methadone 10mg  PO Q8H PRN. Spoke with pharmacy, current prescription for methadone 10mg  PO Q8H scheduled. Confirmed with patient that he does take methadone 10mg  scheduled every 8 hours for chronic pain and has been stable on this regimen for years.  Per Dr. Posey Pronto, orders changed to methadone 10mg  PO Q8H, medication reconciliation updated accordingly.   Rexene Edison, PharmD Clinical Pharmacist  12/08/2014 3:11 PM

## 2014-12-08 NOTE — Progress Notes (Signed)
Patient ID: ABEDNEGO YEATES, male   DOB: 1934/06/11, 80 y.o.   MRN: [id number] [facility] [place] NAME: Elishah Ashmore    MR#:  [id number]  DATE OF BIRTH:  1935-01-03  SUBJECTIVE:   Cough with productive phlegm. Feels a lot better today. No focal weakness. REVIEW OF SYSTEMS:   Review of Systems  Constitutional: Negative for fever, chills and weight loss.  HENT: Negative for ear discharge, ear pain and nosebleeds.   Eyes: Negative for blurred vision, pain and discharge.  Respiratory: Positive for cough, sputum production and shortness of breath. Negative for wheezing and stridor.   Cardiovascular: Negative for chest pain, palpitations, orthopnea and PND.  Gastrointestinal: Negative for nausea, vomiting, abdominal pain and diarrhea.  Genitourinary: Negative for urgency and frequency.  Musculoskeletal: Negative for back pain and joint pain.  Neurological: Negative for sensory change, speech change, focal weakness and weakness.  Psychiatric/Behavioral: Negative for depression and hallucinations. The patient is not nervous/anxious.   All other systems reviewed and are negative.  Tolerating Diet: Yes Tolerating PT: Not needed  DRUG ALLERGIES:   Allergies  Allergen Reactions  . Sulfa Antibiotics     VITALS:  Blood pressure 121/59, pulse 72, temperature 98.5 F (36.9 C), temperature source Oral, resp. rate 17, height 6\' 1"  (1.854 m), weight 89.812 kg (198 lb), SpO2 93 %.  PHYSICAL EXAMINATION:   Physical Exam  GENERAL:  80 y.o.-year-old patient lying in the bed with no acute distress.  EYES: Pupils equal, round, reactive to light and accommodation. No scleral icterus. Extraocular muscles intact.  HEENT: Head atraumatic, normocephalic. Oropharynx and nasopharynx clear.  NECK:  Supple, no jugular venous distention. No thyroid enlargement, no tenderness.  LUNGS: Distant breath sounds bilaterally, no wheezing, rales, rhonchi. No use of  accessory muscles of respiration.  CARDIOVASCULAR: S1, S2 normal. No murmurs, rubs, or gallops.  ABDOMEN: Soft, nontender, nondistended. Bowel sounds present. No organomegaly or mass.  EXTREMITIES: No cyanosis, clubbing or edema b/l.    NEUROLOGIC: Cranial nerves II through XII are intact. No focal Motor or sensory deficits b/l.   PSYCHIATRIC: The patient is alert and oriented x 3.  SKIN: No obvious rash, lesion, or ulcer.   LABORATORY PANEL:   CBC  Recent Labs Lab 12/08/14 0201  WBC 30.0*  HGB 16.5  HCT 49.2  PLT 158    Chemistries   Recent Labs Lab 12/07/14 2117 12/08/14 0201  NA 142 140  K 4.5 4.6  CL 101 106  CO2 31 26  GLUCOSE 145* 138*  BUN 30* 28*  CREATININE 1.38* 1.34*  CALCIUM 9.3 8.7*  AST 60*  --   ALT 43  --   ALKPHOS 145*  --   BILITOT 1.1  --     Cardiac Enzymes  Recent Labs Lab 12/07/14 2117  TROPONINI 0.03    RADIOLOGY:  Dg Chest 1 View  12/07/2014  CLINICAL DATA:  Cough.  Altered mental status. EXAM: CHEST 1 VIEW COMPARISON:  CT scan of the chest dated 04/12/2009 and chest x-ray dated 11/25/2008 FINDINGS: There is a patchy area of infiltrate at the left base. The lungs are otherwise clear. Heart size and vascularity are normal. No significant osseous abnormality. IMPRESSION: Small patchy area of infiltrate at the left lung base. Electronically Signed   By: Lorriane Shire M.D.   On: 12/07/2014 21:42   Ct Head Wo Contrast  12/07/2014  CLINICAL DATA:  Initial evaluation for acute onset confusion. EXAM: CT  HEAD WITHOUT CONTRAST TECHNIQUE: Contiguous axial images were obtained from the base of the skull through the vertex without intravenous contrast. COMPARISON:  Prior study 09/07/2008. FINDINGS: Diffuse prominence of the CSF containing spaces compatible with generalized age-related cerebral atrophy. Mild chronic small vessel ischemic type changes present within the periventricular and deep white matter. These changes are mildly progressed  relative to 2010. Scattered vascular calcifications present within the carotid siphons and distal vertebral arteries. No acute large vessel territory infarct. No intracranial hemorrhage. No mass lesion, midline shift, or mass effect. No hydrocephalus. No extra-axial fluid collection. Scalp soft tissues demonstrate no acute abnormality. Orbits within normal limits. Minimal opacity seen layering within the left maxillary sinus. Mild scattered opacity within the ethmoidal air cells. Paranasal sinuses are otherwise clear. No mastoid effusion. Calvarium intact. IMPRESSION: 1. No acute intracranial process. 2. Mild generalized cerebral atrophy with chronic small vessel ischemic disease, progressed relative to 2010. Electronically Signed   By: Jeannine Boga M.D.   On: 12/07/2014 21:51   ASSESSMENT AND PLAN:  Mylo Choi is a 80 y.o. male with a known history of DM, back pain, hypothyroidism.  patient is confused and unable to provide detailed information. According to patient's wife, patient has had a cough for the past 2 weeks and a worsening for the past 2 days.  1. Acute metabolic encephalopathy Resolved likely due to infection. Patient's mentation is back to baseline  2. Sepsis due to Left lower lobe pneumonia (CAP Continue broad-spectrum antibiotics. Follow up WBC count. Follow-up sputum and blood cultures. Incentive spirometer when necessary nebs.  3. Acute renal failure with dehydration Creatinine improving. Good urine output. Encourage by mouth fluids. DC IV fluids.  4. Diabetes 2 Continue sliding scale and home meds  5. Tobacco abuse ongoing Recommended smoking cessation patient not motivated he reports smoking more than 60+ years  Case discussed with Care Management/Social Worker. Management plans discussed with the patient, family and they are in agreement.  CODE STATUS: Full  DVT Prophylaxis: Lovenox  TOTAL TIME TAKING CARE OF THIS PATIENT: 35 minutes.  >50% time spent on  counselling and coordination of care patient and family  POSSIBLE D/C IN one to 2 DAYS, DEPENDING ON CLINICAL CONDITION.   Cherylynn Liszewski M.D on 12/08/2014 at 3:30 PM  Between 7am to 6pm - Pager - [phone]  After 6pm go to [email or web address] - password [email or web address] [facility]  [facility]  Office  [phone]  CC: Primary care physician; No primary care provider on file.

## 2014-12-08 NOTE — Evaluation (Signed)
Clinical/Bedside Swallow Evaluation Patient Details  Name: Chase Mendez MRN: [id number] Date of Birth: Dec 18, 1934  Today's Date: 12/08/2014 Time: SLP Start Time (ACUTE ONLY): 1135 SLP Stop Time (ACUTE ONLY): 1200 SLP Time Calculation (min) (ACUTE ONLY): 25 min  Past Medical History:  Past Medical History  Diagnosis Date  . Gout   . Back pain   . Diabetes mellitus without complication ([place])   . Thyroid disease   . GERD (gastroesophageal reflux disease)   . Anxiety   . Insomnia    Past Surgical History:  Past Surgical History  Procedure Laterality Date  . Cholecystectomy    . Appendectomy    . Back surgery    . Eye surgery     HPI:  HPI: Chase Mendez is a 80 y.o. male with a known history of DM, back pain, hypothyroidism. The patient was brought to the ED due to confusion one hour prior to the patient's arrival E ED this afternoon. The patient is confused and unable to provide detailed information. According to patient's wife, patient has had a cough for the past 2 weeks and a worsening for the past 2 days. The patient was found confusion, weakness and the mumbling speech this afternoon and then was sent to the ED for further evaluation. The patient denies any symptoms. WBC is 18.5. Chest x-ray show left sided pneumonia. He was treated with Levaquin in the ED.   Assessment / Plan / Recommendation Clinical Impression  Pt presents with no oral-pharyngeal dysphagia and no overt or immediate s/s of aspiration noted by ST during consumption of lunch tray. Pt reports no concerns with his swallow and does not have questions at this time. Pt does not require ST services at this time, but ST is available should our services be required in the future. NSG updated.    Aspiration Risk   (reduced)    Diet Recommendation Age appropriate regular solids;Thin   Medication Administration: Whole meds with liquid Compensations:  (None required at this time)    Other  Recommendations Oral Care  Recommendations: Oral care BID;Patient independent with oral care   Follow Up Recommendations       Frequency and Duration        Pertinent Vitals/Pain None reported    SLP Swallow Goals  N/A   Swallow Study Prior Functional Status   Regular diet with thin liquids    General Date of Onset: 12/07/14 Other Pertinent Information: HPI: Chase Mendez is a 80 y.o. male with a known history of DM, back pain, hypothyroidism. The patient was brought to the ED due to confusion one hour prior to the patient's arrival E ED this afternoon. The patient is confused and unable to provide detailed information. According to patient's wife, patient has had a cough for the past 2 weeks and a worsening for the past 2 days. The patient was found confusion, weakness and the mumbling speech this afternoon and then was sent to the ED for further evaluation. The patient denies any symptoms. WBC is 18.5. Chest x-ray show left sided pneumonia. He was treated with Levaquin in the ED. Type of Study: Bedside swallow evaluation Diet Prior to this Study: Regular;Thin liquids Temperature Spikes Noted: No Respiratory Status: Room air History of Recent Intubation: No Behavior/Cognition: Alert;Cooperative;Pleasant mood Oral Cavity - Dentition: Adequate natural dentition/normal for age Self-Feeding Abilities: Able to feed self Patient Positioning: Upright in bed Baseline Vocal Quality: Normal Volitional Cough: Strong Volitional Swallow: Able to elicit    Oral/Motor/Sensory Function Overall  Oral Motor/Sensory Function: Appears within functional limits for tasks assessed Labial Symmetry: Within Functional Limits Lingual Symmetry: Within Functional Limits Facial Symmetry: Within Functional Limits Mandible: Within Functional Limits   Ice Chips Ice chips: Not tested   Thin Liquid Thin Liquid: Within functional limits Presentation: Cup;Straw    Nectar Thick Nectar Thick Liquid: Not tested   Honey Thick Honey Thick  Liquid: Not tested   Puree Puree: Not tested   Solid   GO    Solid: Within functional limits Presentation: Self Fed Other Comments: Pt consumed majority of his lunch tray (reg. diet -- roast beef sandwich, chips) and drank thin liquids with no overt or immediate s/s of aspiration.        Harper Smoker 12/08/2014,12:16 PM

## 2014-12-08 NOTE — Progress Notes (Signed)
Called Dr. Jannifer [facility] regarding patient's lactic acid level- 2.2.  Doctor ordered increase in normal saline solution to 135ml/hr and another lab draw of lactic acid in 4 hours.  Christene Slates  12/08/2014  3:47 AM

## 2014-12-08 NOTE — Progress Notes (Signed)
ANTIBIOTIC CONSULT NOTE - INITIAL  Pharmacy Consult for Levaquin dosing Indication: CAP  Allergies  Allergen Reactions  . Sulfa Antibiotics     Patient Measurements: Height: 6\' 1"  (185.4 cm) Weight: 198 lb (89.812 kg) IBW/kg (Calculated) : 79.9 Adjusted Body Weight: n/a  Vital Signs: Temp: 98 F (36.7 C) (10/31 2127) Temp Source: Oral (10/31 2127) BP: 110/61 mmHg (10/31 2330) Pulse Rate: 89 (10/31 2330) Intake/Output from previous day:   Intake/Output from this shift:    Labs:  Recent Labs  12/07/14 2117  WBC 18.5*  HGB 17.6  PLT 157  CREATININE 1.38*   Estimated Creatinine Clearance: 49.9 mL/min (by C-G formula based on Cr of 1.38). No results for input(s): VANCOTROUGH, VANCOPEAK, VANCORANDOM, GENTTROUGH, GENTPEAK, GENTRANDOM, TOBRATROUGH, TOBRAPEAK, TOBRARND, AMIKACINPEAK, AMIKACINTROU, AMIKACIN in the last 72 hours.   Microbiology: No results found for this or any previous visit (from the past 720 hour(s)).  Medical History: Past Medical History  Diagnosis Date  . Gout   . Back pain   . Diabetes mellitus without complication ([place])   . Thyroid disease   . GERD (gastroesophageal reflux disease)   . Anxiety   . Insomnia     Medications:   Assessment: Blood and sputum cx pending CXR: L lung base infiltrate UA: (-)  Goal of Therapy:  Resolution of infection  Plan:  750 mg IV q 48 hours ordered for CrCl <50.  Shmiel Morton S 12/08/2014,12:43 AM

## 2014-12-08 NOTE — Progress Notes (Signed)
*  PRELIMINARY RESULTS* Echocardiogram 2D Echocardiogram has been performed.  Laqueta Jean Hege 12/08/2014, 9:30 AM

## 2014-12-09 DIAGNOSIS — L8990 Pressure ulcer of unspecified site, unspecified stage: Secondary | ICD-10-CM | POA: Insufficient documentation

## 2014-12-09 HISTORY — DX: Pressure ulcer of unspecified site, unspecified stage: L89.90

## 2014-12-09 LAB — CBC
HEMATOCRIT: 43.4 % (ref 40.0–52.0)
Hemoglobin: 14.8 g/dL (ref 13.0–18.0)
MCH: 31.2 pg (ref 26.0–34.0)
MCHC: 34.1 g/dL (ref 32.0–36.0)
MCV: 91.7 fL (ref 80.0–100.0)
Platelets: 114 10*3/uL — ABNORMAL LOW (ref 150–440)
RBC: 4.74 MIL/uL (ref 4.40–5.90)
RDW: 14.2 % (ref 11.5–14.5)
WBC: 14.4 10*3/uL — AB (ref 3.8–10.6)

## 2014-12-09 LAB — GLUCOSE, CAPILLARY
Glucose-Capillary: 104 mg/dL — ABNORMAL HIGH (ref 65–99)
Glucose-Capillary: 149 mg/dL — ABNORMAL HIGH (ref 65–99)

## 2014-12-09 MED ORDER — LEVOFLOXACIN 500 MG PO TABS: 500.0000 mg | ORAL_TABLET | Freq: Every day | ORAL | Status: DC

## 2014-12-09 NOTE — Care Management Important Message (Signed)
Important Message  Patient Details  Name: Chase Mendez MRN: [id number] Date of Birth: [birth year]   Medicare Important Message Given:  Yes-second notification given    [name] 12/09/2014, 9:49 AM

## 2014-12-09 NOTE — Care Management Note (Signed)
Case Management Note  Patient Details  Name: Chase Mendez MRN: [id number] Date of Birth: [birth year]  Subjective/Objective:                   Spoke with patient spouse and daugher at time of discharge. Patient is independent from home with no CM needs identified.   Action/Plan:  Home with self care. Expected Discharge Date:                  Expected Discharge Plan:     In-House Referral:     Discharge planning Services     Post Acute Care Choice:    Choice offered to:     DME Arranged:    DME Agency:     HH Arranged:    [place] Agency:     Status of Service:  Completed, signed off  Medicare Important Message Given:  Yes-second notification given Date Medicare IM Given:    Medicare IM give by:    Date Additional Medicare IM Given:    Additional Medicare Important Message give by:     If discussed at [place] of Stay Meetings, dates discussed:    Additional Comments:  Alvie Heidelberg, RN 12/09/2014, 10:45 AM

## 2014-12-09 NOTE — Discharge Summary (Signed)
[facility] NAME: Chase Mendez    MR#:  [id number]  DATE OF BIRTH:  12/14/1934  DATE OF ADMISSION:  12/07/2014 ADMITTING PHYSICIAN: Demetrios Loll, MD  DATE OF DISCHARGE: 12/09/2014  3:14 PM  PRIMARY CARE PHYSICIAN: Jodi Marble MD   ADMISSION DIAGNOSIS:  Community acquired pneumonia [J18.9]  DISCHARGE DIAGNOSIS:  Principal Problem:   Pneumonia Active Problems:   Sepsis ([place])   ARF (acute renal failure) ([place])   Acute encephalopathy   Pressure ulcer  SECONDARY DIAGNOSIS:   Past Medical History  Diagnosis Date  . Gout   . Back pain   . Diabetes mellitus without complication ([place])   . Thyroid disease   . GERD (gastroesophageal reflux disease)   . Anxiety   . Insomnia    HOSPITAL COURSE:  80 y.o. male with a known history of DM, back pain, hypothyroidism, was admitted for acute metabolic encephalopathy.  Please see Dr. Lianne Moris dictated history and physical for further details.  Patient was found to have sepsis due to left lower lobe pneumonia and was improving with antibiotics.  His mental status also improved and was back to his baseline, but second of November and was discharged home in stable condition.   He is agreeable with the discharge planning. DISCHARGE CONDITIONS:  Good CONSULTS OBTAINED:    none DRUG ALLERGIES:   Allergies  Allergen Reactions  . Sulfa Antibiotics    DISCHARGE MEDICATIONS:   Discharge Medication List as of 12/09/2014  2:55 PM    START taking these medications   Details  levofloxacin (LEVAQUIN) 500 MG tablet Take 1 tablet (500 mg total) by mouth daily., Starting 12/09/2014, Until Discontinued, Normal      CONTINUE these medications which have NOT CHANGED   Details  allopurinol (ZYLOPRIM) 100 MG tablet Take 100 mg by mouth daily., Starting 10/26/2014, Until Discontinued, Historical Med    gabapentin (NEURONTIN) 600 MG tablet Take 600 mg by mouth 4 (four) times daily., Starting  11/26/2014, Until Discontinued, Historical Med    levothyroxine (SYNTHROID, LEVOTHROID) 150 MCG tablet Take 150 mcg by mouth every morning., Starting 09/12/2014, Until Discontinued, Historical Med    methadone (DOLOPHINE) 10 MG tablet Take 1 tablet by mouth every 8 (eight) hours. , Starting 11/28/2014, Until Discontinued, Historical Med    mirtazapine (REMERON) 45 MG tablet Take 1 tablet by mouth at bedtime., Starting 11/26/2014, Until Discontinued, Historical Med    risperiDONE (RISPERDAL) 0.25 MG tablet Take 0.25 mg by mouth at bedtime., Starting 11/26/2014, Until Discontinued, Historical Med      STOP taking these medications     metFORMIN (GLUCOPHAGE) 500 MG tablet      naproxen (NAPROSYN) 500 MG tablet        DISCHARGE INSTRUCTIONS:   DIET:  Regular diet DISCHARGE CONDITION:  Good ACTIVITY:  Activity as tolerated OXYGEN:  Home Oxygen: No.  Oxygen Delivery: room air DISCHARGE LOCATION:  home   If you experience worsening of your admission symptoms, develop shortness of breath, life threatening emergency, suicidal or homicidal thoughts you must seek medical attention immediately by calling 911 or calling your MD immediately  if symptoms less severe.  You Must read complete instructions/literature along with all the possible adverse reactions/side effects for all the Medicines you take and that have been prescribed to you. Take any new Medicines after you have completely understood and accpet all the possible adverse reactions/side effects.   Please note  You were cared for by a  hospitalist during your hospital stay. If you have any questions about your discharge medications or the care you received while you were in the hospital after you are discharged, you can call the unit and asked to speak with the hospitalist on call if the hospitalist that took care of you is not available. Once you are discharged, your primary care physician will handle any further medical issues.  Please note that NO REFILLS for any discharge medications will be authorized once you are discharged, as it is imperative that you return to your primary care physician (or establish a relationship with a primary care physician if you do not have one) for your aftercare needs so that they can reassess your need for medications and monitor your lab values.    On the day of Discharge:   VITAL SIGNS:  Blood pressure 140/79, pulse 74, temperature 98.4 F (36.9 C), temperature source Oral, resp. rate 22, height 6\' 1"  (1.854 m), weight 89.812 kg (198 lb), SpO2 93 %. PHYSICAL EXAMINATION:  GENERAL:  80 y.o.-year-old patient lying in the bed with no acute distress.  EYES: Pupils equal, round, reactive to light and accommodation. No scleral icterus. Extraocular muscles intact.  HEENT: Head atraumatic, normocephalic. Oropharynx and nasopharynx clear.  NECK:  Supple, no jugular venous distention. No thyroid enlargement, no tenderness.  LUNGS: Normal breath sounds bilaterally, no wheezing, rales,rhonchi or crepitation. No use of accessory muscles of respiration.  CARDIOVASCULAR: S1, S2 normal. No murmurs, rubs, or gallops.  ABDOMEN: Soft, non-tender, non-distended. Bowel sounds present. No organomegaly or mass.  EXTREMITIES: No pedal edema, cyanosis, or clubbing.  NEUROLOGIC: Cranial nerves II through XII are intact. Muscle strength 5/5 in all extremities. Sensation intact. Gait not checked.  PSYCHIATRIC: The patient is alert and oriented x 3.  SKIN: No obvious rash, lesion, or ulcer.  DATA REVIEW:   CBC  Recent Labs Lab 12/09/14 0522  WBC 14.4*  HGB 14.8  HCT 43.4  PLT 114*    Chemistries   Recent Labs Lab 12/07/14 2117 12/08/14 0201  NA 142 140  K 4.5 4.6  CL 101 106  CO2 31 26  GLUCOSE 145* 138*  BUN 30* 28*  CREATININE 1.38* 1.34*  CALCIUM 9.3 8.7*  AST 60*  --   ALT 43  --   ALKPHOS 145*  --   BILITOT 1.1  --     Cardiac Enzymes  Recent Labs Lab 12/07/14 2117   TROPONINI 0.03    Microbiology Results  Results for orders placed or performed during the hospital encounter of 12/07/14  Blood culture (routine x 2)     Status: None (Preliminary result)   Collection Time: 12/07/14  9:17 PM  Result Value Ref Range Status   Specimen Description BLOOD LEFT ANTECUBITAL  Final   Special Requests BOTTLES DRAWN AEROBIC AND ANAEROBIC 5CC  Final   Culture NO GROWTH 2 DAYS  Final   Report Status PENDING  Incomplete  Blood culture (routine x 2)     Status: None (Preliminary result)   Collection Time: 12/07/14  9:19 PM  Result Value Ref Range Status   Specimen Description BLOOD RIGHT ANTECUBITAL  Final   Special Requests BOTTLES DRAWN AEROBIC AND ANAEROBIC 10CC  Final   Culture NO GROWTH 1 DAY  Final   Report Status PENDING  Incomplete  Culture, sputum-assessment     Status: None   Collection Time: 12/08/14  8:44 AM  Result Value Ref Range Status   Specimen Description EXPECTORATED SPUTUM  Final  Special Requests NONE  Final   Sputum evaluation THIS SPECIMEN IS ACCEPTABLE FOR SPUTUM CULTURE  Final   Report Status 12/08/2014 FINAL  Final  Culture, respiratory (NON-Expectorated)     Status: None (Preliminary result)   Collection Time: 12/08/14  8:44 AM  Result Value Ref Range Status   Specimen Description EXPECTORATED SPUTUM  Final   Special Requests NONE Reflexed from [id number]  Final   Gram Stain   Final    MANY WBC SEEN MANY GRAM POSITIVE COCCI IN CLUSTERS MANY GRAM POSITIVE RODS EXCELLENT SPECIMEN - 90-100% WBCS    Culture   Final    HEAVY GROWTH STAPHYLOCOCCUS AUREUS SUSCEPTIBILITIES TO FOLLOW    Report Status PENDING  Incomplete    RADIOLOGY:  Dg Chest 1 View  12/07/2014  CLINICAL DATA:  Cough.  Altered mental status. EXAM: CHEST 1 VIEW COMPARISON:  CT scan of the chest dated 04/12/2009 and chest x-ray dated 11/25/2008 FINDINGS: There is a patchy area of infiltrate at the left base. The lungs are otherwise clear. Heart size and vascularity  are normal. No significant osseous abnormality. IMPRESSION: Small patchy area of infiltrate at the left lung base. Electronically Signed   By: Lorriane Shire M.D.   On: 12/07/2014 21:42   Ct Head Wo Contrast  12/07/2014  CLINICAL DATA:  Initial evaluation for acute onset confusion. EXAM: CT HEAD WITHOUT CONTRAST TECHNIQUE: Contiguous axial images were obtained from the base of the skull through the vertex without intravenous contrast. COMPARISON:  Prior study 09/07/2008. FINDINGS: Diffuse prominence of the CSF containing spaces compatible with generalized age-related cerebral atrophy. Mild chronic small vessel ischemic type changes present within the periventricular and deep white matter. These changes are mildly progressed relative to 2010. Scattered vascular calcifications present within the carotid siphons and distal vertebral arteries. No acute large vessel territory infarct. No intracranial hemorrhage. No mass lesion, midline shift, or mass effect. No hydrocephalus. No extra-axial fluid collection. Scalp soft tissues demonstrate no acute abnormality. Orbits within normal limits. Minimal opacity seen layering within the left maxillary sinus. Mild scattered opacity within the ethmoidal air cells. Paranasal sinuses are otherwise clear. No mastoid effusion. Calvarium intact. IMPRESSION: 1. No acute intracranial process. 2. Mild generalized cerebral atrophy with chronic small vessel ischemic disease, progressed relative to 2010. Electronically Signed   By: Jeannine Boga M.D.   On: 12/07/2014 21:51     Management plans discussed with the patient, family and they are in agreement.  CODE STATUS: Full code  TOTAL TIME TAKING CARE OF THIS PATIENT: 55 minutes.    [facility], Lorielle Boehning M.D on 12/09/2014 at 4:59 PM  Between 7am to 6pm - Pager - [phone]  After 6pm go to [email or web address] - password [email or web address] [facility]  Office  [phone]  CC: Primary care physician; Jodi Marble MD

## 2014-12-09 NOTE — Discharge Instructions (Signed)

## 2014-12-10 LAB — STREPTOCOCCUS PNEUMONIAE AG (CSF)
SOURCE OF SAMPLE SRC33: 183009
STREPTOCOCCUS PNEUMONIAE AG: NEGATIVE

## 2014-12-10 LAB — CULTURE, RESPIRATORY

## 2014-12-10 LAB — CULTURE, RESPIRATORY W GRAM STAIN

## 2014-12-10 LAB — LEGIONELLA PNEUMOPHILA SEROGP 1 UR AG: L. pneumophila Serogp 1 Ur Ag: NEGATIVE

## 2014-12-10 MED ORDER — LINEZOLID 600 MG PO TABS: 600.0000 mg | ORAL_TABLET | Freq: Two times a day (BID) | ORAL | Status: DC

## 2014-12-10 NOTE — Progress Notes (Signed)
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 Patient was D/C y'day.  I've e-prescribed zyvox and stopped levaquin and notified pharmacy ([facility])  I tried to reach patient but no luck on either number. Per pharmacist patient has still not picked up levaquin.

## 2014-12-11 NOTE — Progress Notes (Signed)
Zyvox cost [phone] - Prior Auth Approved. - Done by me

## 2014-12-12 LAB — CULTURE, BLOOD (ROUTINE X 2): CULTURE: NO GROWTH

## 2014-12-13 LAB — CULTURE, BLOOD (ROUTINE X 2): CULTURE: NO GROWTH

## 2016-05-09 ENCOUNTER — Ambulatory Visit (INDEPENDENT_AMBULATORY_CARE_PROVIDER_SITE_OTHER): Payer: Medicare Other | Admitting: Vascular Surgery

## 2016-05-09 ENCOUNTER — Encounter (INDEPENDENT_AMBULATORY_CARE_PROVIDER_SITE_OTHER): Payer: Self-pay | Admitting: Vascular Surgery

## 2016-05-09 VITALS — BP 148/74 | HR 66 | Resp 16 | Ht 73.5 in | Wt 184.0 lb

## 2016-05-09 DIAGNOSIS — M79605 Pain in left leg: Secondary | ICD-10-CM | POA: Diagnosis not present

## 2016-05-09 DIAGNOSIS — G528 Disorders of other specified cranial nerves: Secondary | ICD-10-CM

## 2016-05-09 DIAGNOSIS — I83813 Varicose veins of bilateral lower extremities with pain: Secondary | ICD-10-CM | POA: Insufficient documentation

## 2016-05-09 DIAGNOSIS — M79604 Pain in right leg: Secondary | ICD-10-CM

## 2016-05-09 NOTE — Progress Notes (Signed)
Subjective:    Patient ID: Chase Mendez, male    DOB: Jul 30, 1934, 82 y.o.   MRN: [id number] Chief Complaint  Patient presents with  . New Evaluation    Leg pain and weakness   Patient last seen in 2016. Seen with wife who provides most of the history. Patient presents with a chief complaint of "bilateral lower extremity pain". Pain is present with activity and at night. 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 Wife states the patient has fallen "a few times". Patient states with his knees bent as her the wife. Pain radiates down his legs. He has >1cm bilateral varicose veins as well. He denies any ulceration to the lower extremity. There is a discoloration to the left foot. Wife states bluish tint to feet after showers. Denies any fever, nausea and vomiting.    Review of Systems  Constitutional: Negative.   HENT: Negative.   Eyes: Negative.   Respiratory: Negative.   Cardiovascular: Positive for leg swelling.  Gastrointestinal: Negative.   Endocrine: Negative.   Genitourinary: Negative.   Musculoskeletal: Negative.   Skin: Positive for color change.       Bilateral Varicose Veins  Allergic/Immunologic: Negative.   Neurological: Negative.   Hematological: Negative.   Psychiatric/Behavioral: Negative.       Objective:   Physical Exam  Constitutional: He is oriented to person, place, and time. He appears well-developed and well-nourished. No distress.  HENT:  Head: Normocephalic and atraumatic.  Eyes: Pupils are equal, round, and reactive to light.  Neck: Normal range of motion.  Cardiovascular: Normal rate, regular rhythm, normal heart sounds and intact distal pulses.   Pulses:      Radial pulses are 2+ on the right side, and 2+ on the left side.       Dorsalis pedis pulses are 1+ on the right side, and 1+ on the left side.       Posterior tibial pulses are 1+ on the right side, and 1+ on the left side.  Pulmonary/Chest: Effort normal.  Musculoskeletal:  Normal range of motion. He exhibits no edema.  Neurological: He is alert and oriented to person, place, and time.  Skin: Skin is warm and dry. He is not diaphoretic.  >1cm varicose veins located bilaterally. Severe stasis dermatitis left foot. No ulceration noted.   Psychiatric: He has a normal mood and affect. His behavior is normal. Judgment and thought content normal.  Vitals reviewed.  BP (!) 148/74 (BP Location: Right Arm)   Pulse 66   Resp 16   Ht 6' 1.5" (1.867 m)   Wt 184 lb (83.5 kg)   BMI 23.95 kg/m   Past Medical History:  Diagnosis Date  . Anxiety   . Back pain   . Diabetes mellitus without complication ([place])   . GERD (gastroesophageal reflux disease)   . Gout   . Insomnia   . Thyroid disease    Social History   Social History  . Marital status: Married    Spouse name: N/A  . Number of children: N/A  . Years of education: N/A   Occupational History  . Not on file.   Social History Main Topics  . Smoking status: Current Every Day Smoker    Types: Cigars  . Smokeless tobacco: Never Used  . Alcohol use No  . Drug use: No  . Sexual activity: Not on file   Other Topics Concern  . Not on file   Social History Narrative  .  No narrative on file   Past Surgical History:  Procedure Laterality Date  . APPENDECTOMY    . BACK SURGERY    . CHOLECYSTECTOMY    . EYE SURGERY     Family History  Problem Relation Age of Onset  . Heart disease Mother   . Diabetes Father    Allergies  Allergen Reactions  . Ibuprofen Nausea Only  . Sulfa Antibiotics       Assessment & Plan:  Patient last seen in 2016. Seen with wife who provides most of the history. Patient presents with a chief complaint of "bilateral lower extremity pain". Pain is present with activity and at night. 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 Wife states the patient has fallen "a few times". Patient states with his knees bent as her the wife. Pain radiates down his legs.  He has >1cm bilateral varicose veins as well. He denies any ulceration to the lower extremity. There is a discoloration to the left foot. Wife states bluish tint to feet after showers. Denies any fever, nausea and vomiting.   1. Lower extremity pain, bilateral - New Seems to be more venous than arterial. Pedal pulses on exam. The patient was encouraged to wear graduated compression stockings (20-30 mmHg) on a daily basis. The patient was instructed to begin wearing the stockings first thing in the morning and removing them in the evening. The patient was instructed specifically not to sleep in the stockings. Prescription given. In addition, behavioral modification including elevation during the day will be initiated. Anti-inflammatories for pain. Patient to follow up with ABI to rule out PAD. Patient follow with venous duplex - rule out reflux.  - VAS [place] ABI WITH/WO TBI; Future - VAS [place] LOWER EXTREMITY VENOUS REFLUX; Future  2. Spinal accessory neuropathy - Stable Most likely a contributing factor to patient lower extremity pain.   3. Varicose veins of both lower extremities with pain - Stable Compression and elevation. As above.   Current Outpatient Prescriptions on File Prior to Visit  Medication Sig Dispense Refill  . allopurinol (ZYLOPRIM) 100 MG tablet Take 100 mg by mouth daily.  0  . gabapentin (NEURONTIN) 600 MG tablet Take 600 mg by mouth 4 (four) times daily.  0  . levothyroxine (SYNTHROID, LEVOTHROID) 150 MCG tablet Take 150 mcg by mouth every morning.  0  . linezolid (ZYVOX) 600 MG tablet Take 1 tablet (600 mg total) by mouth 2 (two) times daily. 14 tablet 0  . methadone (DOLOPHINE) 10 MG tablet Take 1 tablet by mouth every 8 (eight) hours.   0  . mirtazapine (REMERON) 45 MG tablet Take 1 tablet by mouth at bedtime.  0  . risperiDONE (RISPERDAL) 0.25 MG tablet Take 0.25 mg by mouth at bedtime.  0   No current facility-administered medications on file prior to visit.      There are no Patient Instructions on file for this visit. No Follow-up on file.   Marykatherine Sherwood A Mena Lienau, PA-C

## 2016-07-12 ENCOUNTER — Encounter (INDEPENDENT_AMBULATORY_CARE_PROVIDER_SITE_OTHER): Payer: Self-pay

## 2016-07-12 ENCOUNTER — Ambulatory Visit (INDEPENDENT_AMBULATORY_CARE_PROVIDER_SITE_OTHER): Payer: Medicare Other

## 2016-07-12 ENCOUNTER — Ambulatory Visit (INDEPENDENT_AMBULATORY_CARE_PROVIDER_SITE_OTHER): Payer: Medicare Other | Admitting: Vascular Surgery

## 2016-07-12 ENCOUNTER — Encounter (INDEPENDENT_AMBULATORY_CARE_PROVIDER_SITE_OTHER): Payer: Self-pay | Admitting: Vascular Surgery

## 2016-07-12 VITALS — BP 158/82 | HR 79 | Resp 15 | Ht 71.0 in | Wt 180.0 lb

## 2016-07-12 DIAGNOSIS — M79604 Pain in right leg: Secondary | ICD-10-CM | POA: Diagnosis not present

## 2016-07-12 DIAGNOSIS — I872 Venous insufficiency (chronic) (peripheral): Secondary | ICD-10-CM | POA: Insufficient documentation

## 2016-07-12 DIAGNOSIS — M79605 Pain in left leg: Secondary | ICD-10-CM

## 2016-07-12 DIAGNOSIS — I83813 Varicose veins of bilateral lower extremities with pain: Secondary | ICD-10-CM

## 2016-07-12 NOTE — Progress Notes (Signed)
Subjective:    Patient ID: Chase Mendez, male    DOB: 1934-08-10, 82 y.o.   MRN: [id number] Chief Complaint  Patient presents with  . Re-evaluation    Reflux ultrasound   The patient last seen on 05/19/2016. He presents today to review vascular studies. Since our last visit, the patient has been wearing medical grade 1 compression stockings, elevating his legs and remaining active with minimal improvement in his bilateral lower extremity. The patient underwent a bilateral lower extremity venous reflux exam which was notable for no deep vein thrombosis of the bilateral lower extremities, no superficial vein thrombophlebitis of the bilateral lower extremities, incompetence of what appears to be the great saphenous veins bilaterally and incompetence of the right distal great saphenous vein branch at the thigh level. The patient continues to experience bilateral lower extremity pain and swelling. Denies any fever or nausea or vomiting.   Review of Systems  Constitutional: Negative.   HENT: Negative.   Eyes: Negative.   Respiratory: Negative.   Cardiovascular: Positive for leg swelling.  Gastrointestinal: Negative.   Endocrine: Negative.   Genitourinary: Negative.   Musculoskeletal: Negative.   Skin: Negative.   Allergic/Immunologic: Negative.   Neurological: Negative.   Hematological: Negative.   Psychiatric/Behavioral: Negative.       Objective:   Physical Exam  Constitutional: He is oriented to person, place, and time. He appears well-developed and well-nourished. No distress.  HENT:  Head: Normocephalic and atraumatic.  Eyes: Conjunctivae are normal. Pupils are equal, round, and reactive to light.  Neck: Normal range of motion.  Cardiovascular: Normal rate, regular rhythm, normal heart sounds and intact distal pulses.   Pulses:      Radial pulses are 2+ on the right side, and 2+ on the left side.       Dorsalis pedis pulses are 1+ on the right side, and 1+ on the left side.         Posterior tibial pulses are 1+ on the right side, and 1+ on the left side.  Pulmonary/Chest: Effort normal.  Musculoskeletal: Normal range of motion. He exhibits edema (Moderate bilateral lower extremity edema).  Neurological: He is alert and oriented to person, place, and time.  Skin: Skin is warm and dry. He is not diaphoretic.  Psychiatric: He has a normal mood and affect. His behavior is normal. Judgment and thought content normal.  Vitals reviewed.   BP (!) 158/82 (BP Location: Right Arm)   Pulse 79   Resp 15   Ht 5\' 11"  (1.803 m)   Wt 180 lb (81.6 kg)   BMI 25.10 kg/m   Past Medical History:  Diagnosis Date  . Anxiety   . Back pain   . Diabetes mellitus without complication ([place])   . GERD (gastroesophageal reflux disease)   . Gout   . Insomnia   . Thyroid disease     Social History   Social History  . Marital status: Married    Spouse name: N/A  . Number of children: N/A  . Years of education: N/A   Occupational History  . Not on file.   Social History Main Topics  . Smoking status: Current Every Day Smoker    Types: Cigars  . Smokeless tobacco: Never Used  . Alcohol use No  . Drug use: No  . Sexual activity: Not on file   Other Topics Concern  . Not on file   Social History Narrative  . No narrative on file    Past  Surgical History:  Procedure Laterality Date  . APPENDECTOMY    . BACK SURGERY    . CHOLECYSTECTOMY    . EYE SURGERY      Family History  Problem Relation Age of Onset  . Heart disease Mother   . Diabetes Father     Allergies  Allergen Reactions  . Ibuprofen Nausea Only  . Lidocaine   . Sulfa Antibiotics        Assessment & Plan:  The patient last seen on 05/19/2016. He presents today to review vascular studies. Since our last visit, the patient has been wearing medical grade 1 compression stockings, elevating his legs and remaining active with minimal improvement in his bilateral lower extremity. The patient  underwent a bilateral lower extremity venous reflux exam which was notable for no deep vein thrombosis of the bilateral lower extremities, no superficial vein thrombophlebitis of the bilateral lower extremities, incompetence of what appears to be the great saphenous veins bilaterally and incompetence of the right distal great saphenous vein branch at the thigh level. The patient continues to experience bilateral lower extremity pain and swelling. Denies any fever or nausea or vomiting.  1. Varicose veins of both lower extremities with pain - stable As below  2. Venous reflux - New Bilateral greater saphenous vein reflux noted on duplex. Patient will continue trial of medical grade 1 compression stockings and elevation.  Spent over 20 minutes discussing laser ablation and lymphedema pump therapies. Patient will follow-up in 2 months to decide which therapy he would like to move forward with.  Seen with wife  Current Outpatient Prescriptions on File Prior to Visit  Medication Sig Dispense Refill  . allopurinol (ZYLOPRIM) 100 MG tablet Take 100 mg by mouth daily.  0  . atorvastatin (LIPITOR) 10 MG tablet Take 10 mg by mouth every evening.  0  . benzonatate (TESSALON) 100 MG capsule take 1 capsule by mouth three times a day  0  . gabapentin (NEURONTIN) 600 MG tablet Take 600 mg by mouth 4 (four) times daily.  0  . levothyroxine (SYNTHROID, LEVOTHROID) 150 MCG tablet Take 150 mcg by mouth every morning.  0  . metFORMIN (GLUCOPHAGE) 500 MG tablet Take 500 mg by mouth every evening.  0  . mirtazapine (REMERON) 45 MG tablet Take 1 tablet by mouth at bedtime.  0   No current facility-administered medications on file prior to visit.     There are no Patient Instructions on file for this visit. No Follow-up on file.   KIMBERLY A STEGMAYER, PA-C

## 2016-08-21 ENCOUNTER — Emergency Department
Admission: EM | Admit: 2016-08-21 | Discharge: 2016-08-21 | Disposition: A | Discharge status: Home / Self Care | Payer: Medicare Other | Attending: Emergency Medicine | Admitting: Emergency Medicine

## 2016-08-21 ENCOUNTER — Encounter: Payer: Self-pay | Admitting: Emergency Medicine

## 2016-08-21 ENCOUNTER — Emergency Department: Payer: Medicare Other

## 2016-08-21 DIAGNOSIS — Z79899 Other long term (current) drug therapy: Secondary | ICD-10-CM | POA: Insufficient documentation

## 2016-08-21 DIAGNOSIS — J449 Chronic obstructive pulmonary disease, unspecified: Secondary | ICD-10-CM | POA: Diagnosis not present

## 2016-08-21 DIAGNOSIS — R110 Nausea: Secondary | ICD-10-CM | POA: Diagnosis present

## 2016-08-21 DIAGNOSIS — Z7984 Long term (current) use of oral hypoglycemic drugs: Secondary | ICD-10-CM | POA: Diagnosis not present

## 2016-08-21 DIAGNOSIS — R350 Frequency of micturition: Secondary | ICD-10-CM | POA: Diagnosis not present

## 2016-08-21 DIAGNOSIS — F17290 Nicotine dependence, other tobacco product, uncomplicated: Secondary | ICD-10-CM | POA: Diagnosis not present

## 2016-08-21 DIAGNOSIS — E119 Type 2 diabetes mellitus without complications: Secondary | ICD-10-CM | POA: Diagnosis not present

## 2016-08-21 DIAGNOSIS — R0602 Shortness of breath: Secondary | ICD-10-CM

## 2016-08-21 DIAGNOSIS — E079 Disorder of thyroid, unspecified: Secondary | ICD-10-CM | POA: Insufficient documentation

## 2016-08-21 LAB — URINALYSIS, COMPLETE (UACMP) WITH MICROSCOPIC
BACTERIA UA: NONE SEEN
Bilirubin Urine: NEGATIVE
Glucose, UA: NEGATIVE mg/dL
Hgb urine dipstick: NEGATIVE
Ketones, ur: NEGATIVE mg/dL
Leukocytes, UA: NEGATIVE
Nitrite: NEGATIVE
Protein, ur: NEGATIVE mg/dL
SPECIFIC GRAVITY, URINE: 1.014 (ref 1.005–1.030)
pH: 7 (ref 5.0–8.0)

## 2016-08-21 LAB — CBC
HEMATOCRIT: 47.8 % (ref 40.0–52.0)
Hemoglobin: 16.8 g/dL (ref 13.0–18.0)
MCH: 30.4 pg (ref 26.0–34.0)
MCHC: 35.1 g/dL (ref 32.0–36.0)
MCV: 86.8 fL (ref 80.0–100.0)
Platelets: 158 10*3/uL (ref 150–440)
RBC: 5.51 MIL/uL (ref 4.40–5.90)
RDW: 12.8 % (ref 11.5–14.5)
WBC: 9.2 10*3/uL (ref 3.8–10.6)

## 2016-08-21 LAB — COMPREHENSIVE METABOLIC PANEL
ALBUMIN: 4.1 g/dL (ref 3.5–5.0)
ALT: 18 U/L (ref 17–63)
AST: 28 U/L (ref 15–41)
Alkaline Phosphatase: 112 U/L (ref 38–126)
Anion gap: 10 (ref 5–15)
BILIRUBIN TOTAL: 0.9 mg/dL (ref 0.3–1.2)
BUN: 11 mg/dL (ref 6–20)
CHLORIDE: 98 mmol/L — AB (ref 101–111)
CO2: 30 mmol/L (ref 22–32)
Calcium: 9.1 mg/dL (ref 8.9–10.3)
Creatinine, Ser: 0.84 mg/dL (ref 0.61–1.24)
GFR calc Af Amer: >60 mL/min (ref >60)
GFR calc non Af Amer: >60 mL/min (ref >60)
GLUCOSE: 146 mg/dL — AB (ref 65–99)
POTASSIUM: 3.8 mmol/L (ref 3.5–5.1)
Sodium: 138 mmol/L (ref 135–145)
TOTAL PROTEIN: 7.1 g/dL (ref 6.5–8.1)

## 2016-08-21 LAB — LIPASE, BLOOD: Lipase: 37 U/L (ref 11–51)

## 2016-08-21 MED ORDER — ONDANSETRON 4 MG PO TBDP
ORAL_TABLET | ORAL | Status: AC
  Filled 2016-08-21: qty 1

## 2016-08-21 MED ORDER — ALBUTEROL SULFATE HFA 108 (90 BASE) MCG/ACT IN AERS: 2.0000 | INHALATION_SPRAY | Freq: Four times a day (QID) | RESPIRATORY_TRACT | 2 refills | Status: DC | PRN

## 2016-08-21 MED ORDER — ONDANSETRON 4 MG PO TBDP: 4.0000 mg | ORAL_TABLET | Freq: Three times a day (TID) | ORAL | 0 refills | Status: DC | PRN

## 2016-08-21 MED ORDER — SODIUM CHLORIDE 0.9 % IV BOLUS (SEPSIS)
1000.0000 mL | Freq: Once | INTRAVENOUS | Status: AC
  Administered 2016-08-21: 1000 mL via INTRAVENOUS

## 2016-08-21 MED ORDER — ONDANSETRON 4 MG PO TBDP
4.0000 mg | ORAL_TABLET | Freq: Once | ORAL | Status: AC | PRN
  Administered 2016-08-21: 4 mg via ORAL

## 2016-08-21 NOTE — Discharge Instructions (Signed)
Please use your inhaler as needed for shortness of breath. Today I am not diagnosing you with COPD, however I suspect strongly that you may be developing it. Please stop smoking cigars. Please make an appointment to follow-up with her primary care physician in the next week for recheck and return to the emergency department sooner for any concerns.  It was a pleasure to take care of you today, and thank you for coming to our emergency department.  If you have any questions or concerns before leaving please ask the nurse to grab me and I'm more than happy to go through your aftercare instructions again.  If you were prescribed any opioid pain medication today such as Norco, Vicodin, Percocet, morphine, hydrocodone, or oxycodone please make sure you do not drive when you are taking this medication as it can alter your ability to drive safely.  If you have any concerns once you are home that you are not improving or are in fact getting worse before you can make it to your follow-up appointment, please do not hesitate to call 911 and come back for further evaluation.  Darel Hong MD  Results for orders placed or performed during the hospital encounter of 08/21/16  Lipase, blood  Result Value Ref Range   Lipase 37 11 - 51 U/L  Comprehensive metabolic panel  Result Value Ref Range   Sodium 138 135 - 145 mmol/L   Potassium 3.8 3.5 - 5.1 mmol/L   Chloride 98 (L) 101 - 111 mmol/L   CO2 30 22 - 32 mmol/L   Glucose, Bld 146 (H) 65 - 99 mg/dL   BUN 11 6 - 20 mg/dL   Creatinine, Ser 0.84 0.61 - 1.24 mg/dL   Calcium 9.1 8.9 - 10.3 mg/dL   Total Protein 7.1 6.5 - 8.1 g/dL   Albumin 4.1 3.5 - 5.0 g/dL   AST 28 15 - 41 U/L   ALT 18 17 - 63 U/L   Alkaline Phosphatase 112 38 - 126 U/L   Total Bilirubin 0.9 0.3 - 1.2 mg/dL   GFR calc non Af Amer >60 >60 mL/min   GFR calc Af Amer >60 >60 mL/min   Anion gap 10 5 - 15  CBC  Result Value Ref Range   WBC 9.2 3.8 - 10.6 K/uL   RBC 5.51 4.40 - 5.90  MIL/uL   Hemoglobin 16.8 13.0 - 18.0 g/dL   HCT 47.8 40.0 - 52.0 %   MCV 86.8 80.0 - 100.0 fL   MCH 30.4 26.0 - 34.0 pg   MCHC 35.1 32.0 - 36.0 g/dL   RDW 12.8 11.5 - 14.5 %   Platelets 158 150 - 440 K/uL  Urinalysis, Complete w Microscopic  Result Value Ref Range   Color, Urine YELLOW (A) YELLOW   APPearance CLEAR (A) CLEAR   Specific Gravity, Urine 1.014 1.005 - 1.030   pH 7.0 5.0 - 8.0   Glucose, UA NEGATIVE NEGATIVE mg/dL   Hgb urine dipstick NEGATIVE NEGATIVE   Bilirubin Urine NEGATIVE NEGATIVE   Ketones, ur NEGATIVE NEGATIVE mg/dL   Protein, ur NEGATIVE NEGATIVE mg/dL   Nitrite NEGATIVE NEGATIVE   Leukocytes, UA NEGATIVE NEGATIVE   RBC / HPF 0-5 0 - 5 RBC/hpf   WBC, UA 0-5 0 - 5 WBC/hpf   Bacteria, UA NONE SEEN NONE SEEN   Squamous Epithelial / LPF 0-5 (A) NONE SEEN   Mucous PRESENT    Dg Chest 2 View  Result Date: 08/21/2016 CLINICAL DATA:  Hypoxia  during physical therapy EXAM: CHEST  2 VIEW COMPARISON:  12/07/2014 FINDINGS: Cardiac shadow is stable. The lungs are well aerated bilaterally. No focal infiltrate or sizable effusion is seen. Mild aortic calcifications are noted. Degenerative changes of the thoracic spine are seen. IMPRESSION: No acute abnormality noted. Electronically Signed   By: Inez Catalina M.D.   On: 08/21/2016 15:28

## 2016-08-21 NOTE — ED Notes (Signed)
Patient transported to X-ray 

## 2016-08-21 NOTE — ED Notes (Signed)
Assisted pt to use urinal, standing at bedside. Pt tolerated well.

## 2016-08-21 NOTE — ED Triage Notes (Signed)
Pt reports nausea and urinary frequency for four days. Pt reports he is a pain clinic pt and takes suboxone. Has been taking zofran without relief of nausea. Denies pain. Pt reports PT was at house and told him his BP was elevated.

## 2016-08-21 NOTE — ED Provider Notes (Signed)
[facility] Emergency Department Provider Note  ____________________________________________   First MD Initiated Contact with Patient 08/21/16 1433     (approximate)  I have reviewed the triage vital signs and the nursing notes.   HISTORY  Chief Complaint Nausea and Urinary Frequency    HPI Chase Mendez is a 82 y.o. male who comes to the emergency department with multiple issues. First she reports nausea for the past4 days or so. He is a chronic pain patients taking Suboxone and his physician prescribed him Zofran which does help although he is running low. He also reports recent urinary frequency and dysuria. He denies fevers or chills. He denies abdominal pain. He denies back pain. He denies vomiting. Nothing seems to make his nausea better or worse. He also reports mild nonexertional shortness of breath. No chest pain. He does have somewhat of a dry cough.   Past Medical History:  Diagnosis Date  . Anxiety   . Back pain   . Diabetes mellitus without complication ([place])   . GERD (gastroesophageal reflux disease)   . Gout   . Insomnia   . Thyroid disease     Patient Active Problem List   Diagnosis Date Noted  . Venous reflux 07/12/2016  . 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 Varicose veins of both lower extremities with pain 05/09/2016  . Lower extremity pain, bilateral 05/09/2016  . Pressure ulcer 12/09/2014  . Sepsis ([place]) 12/08/2014  . ARF (acute renal failure) ([place]) 12/08/2014  . Acute encephalopathy 12/08/2014  . Pneumonia 12/07/2014    Past Surgical History:  Procedure Laterality Date  . APPENDECTOMY    . BACK SURGERY    . CHOLECYSTECTOMY    . EYE SURGERY      Prior to Admission medications   Medication Sig Start Date End Date Taking? Authorizing Provider  albuterol (PROVENTIL HFA;VENTOLIN HFA) 108 (90 Base) MCG/ACT inhaler Inhale 2 puffs into the lungs every 6 (six) hours as needed for wheezing or shortness of breath.  08/21/16   Darel Hong, MD  allopurinol (ZYLOPRIM) 100 MG tablet Take 100 mg by mouth daily. 10/26/14   [provider]  atorvastatin (LIPITOR) 10 MG tablet Take 10 mg by mouth every evening. 04/08/16   [provider]  benzonatate (TESSALON) 100 MG capsule take 1 capsule by mouth three times a day 03/27/16   [provider]  Buprenorphine HCl-Naloxone HCl (SUBOXONE) 8-2 MG FILM Place under the tongue 2 (two) times daily.    [provider]  DULoxetine (CYMBALTA) 20 MG capsule Take 20 mg by mouth daily.    [provider]  gabapentin (NEURONTIN) 600 MG tablet Take 600 mg by mouth 4 (four) times daily. 11/26/14   [provider]  levothyroxine (SYNTHROID, LEVOTHROID) 150 MCG tablet Take 150 mcg by mouth every morning. 09/12/14   [provider]  meloxicam (MOBIC) 15 MG tablet Take 15 mg by mouth daily.    [provider]  metFORMIN (GLUCOPHAGE) 500 MG tablet Take 500 mg by mouth every evening. 04/08/16   [provider]  mirtazapine (REMERON) 45 MG tablet Take 1 tablet by mouth at bedtime. 11/26/14   [provider]  omeprazole (PRILOSEC) 20 MG capsule Take 20 mg by mouth daily.    [provider]  ondansetron (ZOFRAN ODT) 4 MG disintegrating tablet Take 1 tablet (4 mg total) by mouth every 8 (eight) hours as needed for nausea or vomiting. 08/21/16   Darel Hong, MD    Allergies  Ibuprofen; Lidocaine; and Sulfa antibiotics  Family History  Problem Relation Age of Onset  . Heart disease Mother   . Diabetes Father     Social History Social History  Substance Use Topics  . Smoking status: Current Every Day Smoker    Types: Cigars  . Smokeless tobacco: Never Used  . Alcohol use No    Review of Systems Constitutional: No fever/chills Eyes: No visual changes. ENT: No sore throat. Cardiovascular: Denies chest pain. Respiratory: Positive shortness of breath. Gastrointestinal: No abdominal  pain.  Positive nausea, no vomiting.  No diarrhea.  No constipation. Genitourinary: Positive for dysuria. Musculoskeletal: Negative for back pain. Skin: Negative for rash. Neurological: Negative for headaches, focal weakness or numbness.   ____________________________________________   PHYSICAL EXAM:  VITAL SIGNS: ED Triage Vitals [08/21/16 1233]  Enc Vitals Group     BP (!) 145/78     Pulse Rate 89     Resp 18     Temp 98.3 F (36.8 C)     Temp Source Oral     SpO2 94 %     Weight 175 lb (79.4 kg)     Height 5\' 11"  (1.803 m)     Head Circumference      Peak Flow      Pain Score 3     Pain Loc      Pain Edu?      Excl. in [place]?     Constitutional: Alert and oriented 4 very well-appearing nontoxic no diaphoresis speaks in full clear sentences Eyes: PERRL EOMI. Head: Atraumatic. Nose: No congestion/rhinnorhea. Mouth/Throat: No trismus Neck: No stridor.   Cardiovascular: Normal rate, regular rhythm. Grossly normal heart sounds.  Good peripheral circulation. Respiratory: Normal respiratory effort.  No retractions. Lungs CTAB and moving good air Gastrointestinal: Soft nontender Musculoskeletal: No lower extremity edema   Neurologic:  Normal speech and language. No gross focal neurologic deficits are appreciated. Skin:  Skin is warm, dry and intact. No rash noted. Psychiatric: Mood and affect are normal. Speech and behavior are normal.    ____________________________________________   DIFFERENTIAL includes but not limited to  COPD, pneumothorax, pneumonia, urinary tract infection, pyelonephritis, prostatitis ____________________________________________   LABS (all labs ordered are listed, but only abnormal results are displayed)  Labs Reviewed  COMPREHENSIVE METABOLIC PANEL - Abnormal; Notable for the following:       Result Value   Chloride 98 (*)    Glucose, Bld 146 (*)    All other components within normal limits  URINALYSIS, COMPLETE (UACMP) WITH  MICROSCOPIC - Abnormal; Notable for the following:    Color, Urine YELLOW (*)    APPearance CLEAR (*)    Squamous Epithelial / LPF 0-5 (*)    All other components within normal limits  LIPASE, BLOOD  CBC    No evidence of urinary tract infection __________________________________________    ____________________________________________  RADIOLOGY  Chest x-ray with no acute disease ____________________________________________   PROCEDURES  Procedure(s) performed: no  Procedures  Critical Care performed: no  Observation: no ____________________________________________   INITIAL IMPRESSION / ASSESSMENT AND PLAN / ED COURSE  Pertinent labs & imaging results that were available during my care of the patient were reviewed by me and considered in my medical decision making (see chart for details).       ----------------------------------------- 4:05 PM on 08/21/2016 -----------------------------------------  The patient feels significantly improved and is saturating 95% on room air. While he has never been diagnosed with COPD before he has had an albuterol  inhaler which has helped him and he is run out. We suspect that he may actually have COPD but I will defer the diagnosis to his primary care physician. Regardless he is able to eat and drink his abdomen is benign and he does not warrant advanced imaging at this time. He is medically stable for outpatient management. ____________________________________________   FINAL CLINICAL IMPRESSION(S) / ED DIAGNOSES  Final diagnoses:  Nausea  Shortness of breath  Chronic obstructive pulmonary disease, unspecified COPD type ([place])      NEW MEDICATIONS STARTED DURING THIS VISIT:  Discharge Medication List as of 08/21/2016  4:05 PM    START taking these medications   Details  albuterol (PROVENTIL HFA;VENTOLIN HFA) 108 (90 Base) MCG/ACT inhaler Inhale 2 puffs into the lungs every 6 (six) hours as needed for wheezing or  shortness of breath., Starting Mon 08/21/2016, Print    ondansetron (ZOFRAN ODT) 4 MG disintegrating tablet Take 1 tablet (4 mg total) by mouth every 8 (eight) hours as needed for nausea or vomiting., Starting Mon 08/21/2016, Print         Note:  This document was prepared using Dragon voice recognition software and may include unintentional dictation errors.     Darel Hong, MD 08/22/16 [id number]

## 2016-09-14 ENCOUNTER — Ambulatory Visit (INDEPENDENT_AMBULATORY_CARE_PROVIDER_SITE_OTHER): Payer: Medicare Other | Admitting: Vascular Surgery

## 2016-09-14 ENCOUNTER — Encounter (INDEPENDENT_AMBULATORY_CARE_PROVIDER_SITE_OTHER): Payer: Self-pay | Admitting: Vascular Surgery

## 2016-09-14 VITALS — BP 117/65 | HR 79 | Resp 16 | Wt 181.0 lb

## 2016-09-14 DIAGNOSIS — I83813 Varicose veins of bilateral lower extremities with pain: Secondary | ICD-10-CM

## 2016-09-14 DIAGNOSIS — I872 Venous insufficiency (chronic) (peripheral): Secondary | ICD-10-CM | POA: Diagnosis not present

## 2016-09-17 NOTE — Progress Notes (Signed)
MRN : [id number]  Chase Mendez is a 82 y.o. ([birth year]) male who presents with chief complaint of  Chief Complaint  Patient presents with  . Follow-up    discuss laser  .  History of Present Illness: The patient returns to the office for followup evaluation regarding leg swelling.  The swelling has improved quite a bit and the pain associated with swelling has decreased substantially. There have not been any interval development of a ulcerations or wounds.  Since the previous visit the patient has been wearing graduated compression stockings and has noted little significant improvement in the lymphedema. The patient has been using compression routinely morning until night.  The patient also states elevation during the day and exercise is being done too.     Current Meds  Medication Sig  . albuterol (PROVENTIL HFA;VENTOLIN HFA) 108 (90 Base) MCG/ACT inhaler Inhale 2 puffs into the lungs every 6 (six) hours as needed for wheezing or shortness of breath.  . allopurinol (ZYLOPRIM) 100 MG tablet Take 100 mg by mouth daily.  [name] atorvastatin (LIPITOR) 10 MG tablet Take 10 mg by mouth every evening.  . Buprenorphine HCl-Naloxone HCl (SUBOXONE) 8-2 MG FILM Place under the tongue 2 (two) times daily.  . DULoxetine (CYMBALTA) 20 MG capsule Take 20 mg by mouth daily.  . fluticasone (FLONASE) 50 MCG/ACT nasal spray fluticasone propionate 50 mcg/act susp  . gabapentin (NEURONTIN) 600 MG tablet Take 600 mg by mouth 4 (four) times daily.  [name] levothyroxine (SYNTHROID, LEVOTHROID) 150 MCG tablet Take 150 mcg by mouth every morning.  . metFORMIN (GLUCOPHAGE) 500 MG tablet Take 500 mg by mouth every evening.  [name] omeprazole (PRILOSEC) 20 MG capsule Take 20 mg by mouth daily.  . ondansetron (ZOFRAN ODT) 4 MG disintegrating tablet Take 1 tablet (4 mg total) by mouth every 8 (eight) hours as needed for nausea or vomiting.  [name] testosterone (ANDROGEL) 50 MG/5GM (1%) GEL Place onto the skin daily.    Past  Medical History:  Diagnosis Date  . Anxiety   . Back pain   . Diabetes mellitus without complication ([place])   . GERD (gastroesophageal reflux disease)   . Gout   . Insomnia   . Thyroid disease     Past Surgical History:  Procedure Laterality Date  . APPENDECTOMY    . BACK SURGERY    . CHOLECYSTECTOMY    . EYE SURGERY      Social History Social History  Substance Use Topics  . Smoking status: Current Every Day Smoker    Types: Cigars  . Smokeless tobacco: Never Used  . Alcohol use No    Family History Family History  Problem Relation Age of Onset  . Heart disease Mother   . Diabetes Father     Allergies  Allergen Reactions  . Ibuprofen Nausea Only  . Lidocaine   . Sulfa Antibiotics      REVIEW OF SYSTEMS (Negative unless checked)  Constitutional: [] Weight loss  [] Fever  [] Chills Cardiac: [] Chest pain   [] Chest pressure   [] Palpitations   [] Shortness of breath when laying flat   [] Shortness of breath with exertion. Vascular:  [] Pain in legs with walking   [x] Pain in legs   [] History of DVT   [] Phlebitis   [x] Swelling in legs   [] Varicose veins   [] Non-healing ulcers Pulmonary:   [] Uses home oxygen   [] Productive cough   [] Hemoptysis   [] Wheeze  [] COPD   [] Asthma Neurologic:  [] Dizziness   [] Seizures   []   History of stroke   [] History of TIA  [] Aphasia   [] Vissual changes   [] Weakness or numbness in arm   [] Weakness or numbness in leg Musculoskeletal:   [] Joint swelling   [] Joint pain   [] Low back pain Hematologic:  [] Easy bruising  [] Easy bleeding   [] Hypercoagulable state   [] Anemic Gastrointestinal:  [] Diarrhea   [] Vomiting  [] Gastroesophageal reflux/heartburn   [] Difficulty swallowing. Genitourinary:  [] Chronic kidney disease   [] Difficult urination  [] Frequent urination   [] Blood in urine Skin:  [x] Rashes   [] Ulcers  Psychological:  [] History of anxiety   []  History of major depression.  Physical Examination  Vitals:   09/14/16 1333  BP: 117/65  Pulse: 79   Resp: 16  Weight: 181 lb (82.1 kg)   Body mass index is 25.24 kg/m. Gen: WD/WN, NAD Head: College/AT, No temporalis wasting.  Ear/Nose/Throat: Hearing grossly intact, nares w/o erythema or drainage Eyes: PER, EOMI, sclera nonicteric.  Neck: Supple, no large masses.   Pulmonary:  Good air movement, no audible wheezing bilaterally, no use of accessory muscles.  Cardiac: RRR, no JVD Vascular:  varicosities present extensively greater than 5 mm bilaterally.  moderate venous stasis changes to the legs bilaterally.  2+ soft pitting edema Vessel Right Left  Radial Palpable Palpable  Ulnar Palpable Palpable  Brachial Palpable Palpable  Carotid Palpable Palpable  Femoral Palpable Palpable  Popliteal Palpable Palpable  PT Palpable Palpable  DP Palpable Palpable  Gastrointestinal: Non-distended. No guarding/no peritoneal signs.  Musculoskeletal: M/S 5/5 throughout.  No deformity or atrophy.  Neurologic: CN 2-12 intact. Symmetrical.  Speech is fluent. Motor exam as listed above. Psychiatric: Judgment intact, Mood & affect appropriate for pt's clinical situation. Dermatologic: venous rashes no ulcers noted.  No changes consistent with cellulitis. Lymph : No lichenification or skin changes of chronic lymphedema.  CBC Lab Results  Component Value Date   WBC 9.2 08/21/2016   HGB 16.8 08/21/2016   HCT 47.8 08/21/2016   MCV 86.8 08/21/2016   PLT 158 08/21/2016    BMET    Component Value Date/Time   NA 138 08/21/2016 1232   K 3.8 08/21/2016 1232   CL 98 (L) 08/21/2016 1232   CO2 30 08/21/2016 1232   GLUCOSE 146 (H) 08/21/2016 1232   BUN 11 08/21/2016 1232   CREATININE 0.84 08/21/2016 1232   CALCIUM 9.1 08/21/2016 1232   GFRNONAA >60 08/21/2016 1232   GFRAA >60 08/21/2016 1232   CrCl cannot be calculated (Patient's most recent lab result is older than the maximum 21 days allowed.).  COAG No results found for: INR, PROTIME  Radiology Dg Chest 2 View  Result Date:  08/21/2016 CLINICAL DATA:  Hypoxia during physical therapy EXAM: CHEST  2 VIEW COMPARISON:  12/07/2014 FINDINGS: Cardiac shadow is stable. The lungs are well aerated bilaterally. No focal infiltrate or sizable effusion is seen. Mild aortic calcifications are noted. Degenerative changes of the thoracic spine are seen. IMPRESSION: No acute abnormality noted. Electronically Signed   By: Inez Catalina M.D.   On: 08/21/2016 15:28    Assessment/Plan 1. Varicose veins of both lower extremities with pain No surgery or intervention at this point in time.  I have reviewed my discussion with the patient regarding venous insufficiency and why it causes symptoms. I have discussed with the patient the chronic skin changes that accompany venous insufficiency and the long term sequela such as ulceration. Patient will contnue wearing graduated compression stockings on a daily basis, as this has provided excellent control of  his edema. The patient will put the stockings on first thing in the morning and removing them in the evening. The patient is reminded not to sleep in the stockings.  In addition, behavioral modification including elevation during the day will be initiated. Exercise is strongly encouraged.  Duplex ultrasound of the bilateral lower extremity shows mild reflux  Given the patient's good control and lack of any problems regarding the venous insufficiency and lymphedema a lymph pump in not need at this time.  The patient will follow up with me PRN should anything change.  The patient voices agreement with this plan.   2. Venous reflux See #1    Hortencia Pilar, MD  09/17/2016 12:41 PM

## 2017-01-25 ENCOUNTER — Encounter: Payer: Self-pay | Admitting: Emergency Medicine

## 2017-01-25 ENCOUNTER — Emergency Department: Payer: Medicare Other

## 2017-01-25 ENCOUNTER — Emergency Department
Admission: EM | Admit: 2017-01-25 | Discharge: 2017-01-25 | Disposition: A | Discharge status: Home / Self Care | Payer: Medicare Other | Attending: Emergency Medicine | Admitting: Emergency Medicine

## 2017-01-25 ENCOUNTER — Other Ambulatory Visit: Payer: Self-pay

## 2017-01-25 DIAGNOSIS — I10 Essential (primary) hypertension: Secondary | ICD-10-CM | POA: Diagnosis not present

## 2017-01-25 DIAGNOSIS — R110 Nausea: Secondary | ICD-10-CM | POA: Diagnosis not present

## 2017-01-25 DIAGNOSIS — E119 Type 2 diabetes mellitus without complications: Secondary | ICD-10-CM | POA: Diagnosis not present

## 2017-01-25 DIAGNOSIS — Z79899 Other long term (current) drug therapy: Secondary | ICD-10-CM | POA: Insufficient documentation

## 2017-01-25 DIAGNOSIS — R197 Diarrhea, unspecified: Secondary | ICD-10-CM | POA: Diagnosis not present

## 2017-01-25 DIAGNOSIS — R51 Headache: Secondary | ICD-10-CM | POA: Insufficient documentation

## 2017-01-25 DIAGNOSIS — F17210 Nicotine dependence, cigarettes, uncomplicated: Secondary | ICD-10-CM | POA: Diagnosis not present

## 2017-01-25 DIAGNOSIS — Z7984 Long term (current) use of oral hypoglycemic drugs: Secondary | ICD-10-CM | POA: Diagnosis not present

## 2017-01-25 LAB — CBC
HCT: 47 % (ref 40.0–52.0)
HEMOGLOBIN: 16.6 g/dL (ref 13.0–18.0)
MCH: 32.1 pg (ref 26.0–34.0)
MCHC: 35.4 g/dL (ref 32.0–36.0)
MCV: 90.7 fL (ref 80.0–100.0)
PLATELETS: 169 10*3/uL (ref 150–440)
RBC: 5.18 MIL/uL (ref 4.40–5.90)
RDW: 14.2 % (ref 11.5–14.5)
WBC: 10.5 10*3/uL (ref 3.8–10.6)

## 2017-01-25 LAB — BASIC METABOLIC PANEL
ANION GAP: 10 (ref 5–15)
BUN: 16 mg/dL (ref 6–20)
CALCIUM: 9.2 mg/dL (ref 8.9–10.3)
CO2: 28 mmol/L (ref 22–32)
CREATININE: 0.98 mg/dL (ref 0.61–1.24)
Chloride: 99 mmol/L — ABNORMAL LOW (ref 101–111)
Glucose, Bld: 155 mg/dL — ABNORMAL HIGH (ref 65–99)
Potassium: 3.2 mmol/L — ABNORMAL LOW (ref 3.5–5.1)
SODIUM: 137 mmol/L (ref 135–145)

## 2017-01-25 LAB — TROPONIN I

## 2017-01-25 MED ORDER — METOCLOPRAMIDE HCL 5 MG/ML IJ SOLN
10.0000 mg | Freq: Once | INTRAMUSCULAR | Status: AC
  Administered 2017-01-25: 10 mg via INTRAVENOUS
  Filled 2017-01-25: qty 2

## 2017-01-25 MED ORDER — CLONIDINE HCL 0.1 MG PO TABS
0.2000 mg | ORAL_TABLET | Freq: Once | ORAL | Status: AC
  Administered 2017-01-25: 0.2 mg via ORAL
  Filled 2017-01-25: qty 2

## 2017-01-25 MED ORDER — METOCLOPRAMIDE HCL 10 MG PO TABS: 10.0000 mg | ORAL_TABLET | Freq: Three times a day (TID) | ORAL | 1 refills | Status: DC | PRN

## 2017-01-25 MED ORDER — OXYCODONE HCL 5 MG PO TABS
10.0000 mg | ORAL_TABLET | Freq: Once | ORAL | Status: AC
  Administered 2017-01-25: 10 mg via ORAL
  Filled 2017-01-25: qty 2

## 2017-01-25 NOTE — Discharge Instructions (Signed)
Return to the emergency department for new, worsening, or persistent severely high blood pressures especially with a top number over 200 or a bottom number over 120, severe headache, chest pain, difficulty breathing, new leg swelling, decrease in your urination, worsening nausea or inability to tolerate anything by mouth, or any other new or worsening symptoms that concern you.  You can try the Reglan (metoclopramide) prescribed today as an alternative to the Zofran you have been taking, but do not take both of them at the same time.  You should continue your losartan as prescribed by your regular doctor.  Follow-up with your regular doctor, gastroenterologist, and cardiologist within the next 1-2 weeks.

## 2017-01-25 NOTE — ED Notes (Signed)

## 2017-01-25 NOTE — ED Notes (Signed)
Pt c/o pain 10/10 in back, legs and head and states did not take prescribed 10mg  OXY IR at 3pm today. Verbal from [facility]

## 2017-01-25 NOTE — ED Notes (Signed)
Patient transported to CT 

## 2017-01-25 NOTE — ED Notes (Signed)
Patient transported to X-ray 

## 2017-01-25 NOTE — ED Triage Notes (Signed)
Pt bib [facility] from home d/t persistent HA x2days, hypertension (started losartan yesterday),left sided jaw pain and constipation. EMS reports hypertension with all other VSS, 12L WNL

## 2017-01-25 NOTE — ED Notes (Signed)
ED Provider at bedside. 

## 2017-01-25 NOTE — ED Provider Notes (Signed)
[facility] Emergency Department Provider Note ____________________________________________   First MD Initiated Contact with Patient 01/25/17 1948     (approximate)  I have reviewed the triage vital signs and the nursing notes.   HISTORY  Chief Complaint Hypertension; Headache; and Constipation    HPI [name] is a 82 y.o. male with past medical history as noted below who presents with concern of hypertension, measured to 200s over 90s, and associated with frontal headache, and generalized weakness.  Per patient and his family members, he has been having increasing nausea over the last several weeks and recently was increased on his antacid medications.  They had noted some increased blood pressure readings over the last several days, and yesterday he was started on losartan by the PA working in his cardiologist's practice.  Initially last night and this morning patient had improved blood pressure, but then had higher readings and began to have relatively severe headache.  Patient reports persistent nausea although this is been unchanged for the last several weeks, and he denies chest pain or difficulty breathing.  No new leg pain or swelling.  Past Medical History:  Diagnosis Date  . Anxiety   . Back pain   . Diabetes mellitus without complication ([place])   . GERD (gastroesophageal reflux disease)   . Gout   . Insomnia   . Thyroid disease     Patient Active Problem List   Diagnosis Date Noted  . Venous reflux 07/12/2016  . 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 Varicose veins of both lower extremities with pain 05/09/2016  . Lower extremity pain, bilateral 05/09/2016  . Pressure ulcer 12/09/2014  . Sepsis ([place]) 12/08/2014  . ARF (acute renal failure) ([place]) 12/08/2014  . Acute encephalopathy 12/08/2014  . Pneumonia 12/07/2014    Past Surgical History:  Procedure Laterality Date  . APPENDECTOMY    . BACK SURGERY    . CHOLECYSTECTOMY      . EYE SURGERY      Prior to Admission medications   Medication Sig Start Date End Date Taking? Authorizing Provider  albuterol (PROVENTIL HFA;VENTOLIN HFA) 108 (90 Base) MCG/ACT inhaler Inhale 2 puffs into the lungs every 6 (six) hours as needed for wheezing or shortness of breath. 08/21/16   Darel Hong, MD  allopurinol (ZYLOPRIM) 100 MG tablet Take 100 mg by mouth daily. 10/26/14   [provider]  atorvastatin (LIPITOR) 10 MG tablet Take 10 mg by mouth every evening. 04/08/16   [provider]  benzonatate (TESSALON) 100 MG capsule take 1 capsule by mouth three times a day 03/27/16   [provider]  Buprenorphine HCl-Naloxone HCl (SUBOXONE) 8-2 MG FILM Place under the tongue 2 (two) times daily.    [provider]  DULoxetine (CYMBALTA) 20 MG capsule Take 20 mg by mouth daily.    [provider]  fluticasone (FLONASE) 50 MCG/ACT nasal spray fluticasone propionate 50 mcg/act susp    [provider]  gabapentin (NEURONTIN) 600 MG tablet Take 600 mg by mouth 4 (four) times daily. 11/26/14   [provider]  levothyroxine (SYNTHROID, LEVOTHROID) 150 MCG tablet Take 150 mcg by mouth every morning. 09/12/14   [provider]  meloxicam (MOBIC) 15 MG tablet Take 15 mg by mouth daily.    [provider]  metFORMIN (GLUCOPHAGE) 500 MG tablet Take 500 mg by mouth every evening. 04/08/16   [provider]  metoCLOPramide (REGLAN) 10 MG tablet Take 1 tablet (10 mg total) by mouth  every 8 (eight) hours as needed for up to 10 days for nausea or vomiting. 01/25/17 02/04/17  Arta Silence, MD  mirtazapine (REMERON) 45 MG tablet Take 1 tablet by mouth at bedtime. 11/26/14   [provider]  omeprazole (PRILOSEC) 20 MG capsule Take 20 mg by mouth daily.    [provider]  ondansetron (ZOFRAN ODT) 4 MG disintegrating tablet Take 1 tablet (4 mg total) by mouth every 8 (eight) hours as needed for nausea  or vomiting. 08/21/16   Darel Hong, MD  testosterone (ANDROGEL) 50 MG/5GM (1%) GEL Place onto the skin daily.    [provider]    Allergies Ibuprofen; Lidocaine; and Sulfa antibiotics  Family History  Problem Relation Age of Onset  . Heart disease Mother   . Diabetes Father     Social History Social History   Tobacco Use  . Smoking status: Current Every Day Smoker    Types: Cigars  . Smokeless tobacco: Never Used  Substance Use Topics  . Alcohol use: No  . Drug use: No    Review of Systems  Constitutional: No fever. Eyes: No visual changes. ENT: No sore throat. Cardiovascular: Denies chest pain. Respiratory: Denies shortness of breath. Gastrointestinal: No nausea, no vomiting.  No diarrhea.  Genitourinary: Negative for dysuria.  Musculoskeletal: Negative for back pain. Skin: Negative for rash. Neurological: Negative for headaches, focal weakness or numbness.   ____________________________________________   PHYSICAL EXAM:  VITAL SIGNS: ED Triage Vitals  Enc Vitals Group     BP 01/25/17 1900 (!) 182/91     Pulse Rate 01/25/17 1907 69     Resp 01/25/17 1907 19     Temp 01/25/17 1907 98.3 F (36.8 C)     Temp Source 01/25/17 1907 Oral     SpO2 01/25/17 1907 95 %     Weight 01/25/17 1908 181 lb (82.1 kg)     Height 01/25/17 1908 6' (1.829 m)     Head Circumference --      Peak Flow --      Pain Score 01/25/17 1906 10     Pain Loc --      Pain Edu? --      Excl. in [place]? --     Constitutional: Alert and oriented.  Somewhat uncomfortable appearing but in no acute distress. Eyes: Conjunctivae are normal.  EOMI.  PERRLA. Head: Atraumatic. Nose: No congestion/rhinnorhea. Mouth/Throat: Mucous membranes are moist.   Neck: Normal range of motion.  Cardiovascular: Normal rate, regular rhythm. Grossly normal heart sounds.  Good peripheral circulation. Respiratory: Normal respiratory effort.  No retractions. Lungs CTAB. Gastrointestinal: Soft and  nontender. No distention.  Genitourinary: No CVA tenderness. Musculoskeletal: No lower extremity edema.  Extremities warm and well perfused.  Neurologic:  Normal speech and language. No gross focal neurologic deficits are appreciated.  Motor and sensory intact in all extremities except for chronic left lower extremity weakness.  Cranial nerves III through XII intact. Skin:  Skin is warm and dry. No rash noted. Psychiatric: Mood and affect are normal. Speech and behavior are normal.  ____________________________________________   LABS (all labs ordered are listed, but only abnormal results are displayed)  Labs Reviewed  BASIC METABOLIC PANEL - Abnormal; Notable for the following components:      Result Value   Potassium 3.2 (*)    Chloride 99 (*)    Glucose, Bld 155 (*)    All other components within normal limits  CBC  TROPONIN I   ____________________________________________  EKG  ED ECG REPORT I, Arta Silence, the attending physician, personally viewed and interpreted this ECG.  Date: 01/25/2017 EKG Time: 1904 Rate: 67 Rhythm: normal sinus rhythm QRS Axis: normal Intervals: RBBB and LAFB ST/T Wave abnormalities: normal Narrative Interpretation: no evidence of acute ischemia; no recent prior EKG available for comparison  ____________________________________________  RADIOLOGY  CXR: No acute findings. CT head: No ICH or other acute findings  ____________________________________________   PROCEDURES  Procedure(s) performed: No    Critical Care performed: No ____________________________________________   INITIAL IMPRESSION / ASSESSMENT AND PLAN / ED COURSE  Pertinent labs & imaging results that were available during my care of the patient were reviewed by me and considered in my medical decision making (see chart for details).  82 year old male with past medical history as noted above presents with elevated blood pressure readings associated with  headache and malaise, as well as persistent nausea over the last several weeks.  Past medical records reviewed in Epic.  Patient was last seen in the emergency department approximately 6 months ago for nausea, and at that time he was on Suboxone for chronic pain.  Patient has been taken off the Suboxone and was on methadone until a few weeks ago but has been weaned off.  On exam, patient is slightly uncomfortable but not acutely ill-appearing, is hypertensive, but other vital signs are normal, and the remainder of the exam is unremarkable.  His EKG shows no acute findings.  Overall suspect that the nausea and the hypertension may be related to discontinuation of his chronic pain medications.  The blood pressure is improved here from the readings of the patient's family was obtaining at home, and I have low suspicion for hypertensive emergency or end organ damage, however given the patient's relatively severe headache which is not normal for him I will obtain a CT to rule out [facility] or other acute findings, and will obtain chest x-ray and labs including troponin to rule out ACS or other endorgan dysfunction related to elevated blood pressure.  I will also give Reglan for symptomatic treatment, and a dose of clonidine here.  Anticipate discharge home if the workup is negative, blood pressures improved, and the patient is feeling better.    ----------------------------------------- 11:14 PM on 01/25/2017 -----------------------------------------  Patient's blood pressure is now markedly improved.  He also states that his headache is mostly resolved, although he still has persistent nausea which is his chronic nausea that has been having for some time.  Patient's CT head, chest x-ray, and lab workup are within normal limits.  He states he feels better and feels comfortable to go home.  I instructed him to continue the losartan as prescribed by his doctor, and the family asked if we could have a prescription  for Reglan to try instead of the Zofran which has not been helping him.  I spent approximately 10 minutes discussing the results of the workup, and the discharge instructions and follow-up plan with the patient and his family members.  I explained the return precautions, and the patient and family expressed understanding. ____________________________________________   FINAL CLINICAL IMPRESSION(S) / ED DIAGNOSES  Final diagnoses:  Hypertension, unspecified type  Chronic nausea      NEW MEDICATIONS STARTED DURING THIS VISIT:  This SmartLink is deprecated. Use AVSMEDLIST instead to display the medication list for a patient.   Note:  This document was prepared using Dragon voice recognition software and may include unintentional dictation errors.    Arta Silence, MD 01/25/17 2316

## 2017-01-25 NOTE — ED Notes (Signed)
Family at bedside. 

## 2017-04-23 ENCOUNTER — Encounter: Payer: Self-pay | Admitting: Emergency Medicine

## 2017-04-24 ENCOUNTER — Ambulatory Visit: Payer: Medicare Other | Admitting: Anesthesiology

## 2017-04-24 ENCOUNTER — Ambulatory Visit
Admission: RE | Admit: 2017-04-24 | Discharge: 2017-04-24 | Disposition: A | Discharge status: Home / Self Care | Payer: Medicare Other | Source: Ambulatory Visit | Attending: Unknown Physician Specialty | Admitting: Unknown Physician Specialty

## 2017-04-24 ENCOUNTER — Encounter
Admission: RE | Disposition: A | Discharge status: Home / Self Care | Payer: Self-pay | Source: Ambulatory Visit | Attending: Unknown Physician Specialty

## 2017-04-24 ENCOUNTER — Encounter: Payer: Self-pay | Admitting: Anesthesiology

## 2017-04-24 ENCOUNTER — Other Ambulatory Visit: Payer: Self-pay

## 2017-04-24 DIAGNOSIS — F329 Major depressive disorder, single episode, unspecified: Secondary | ICD-10-CM | POA: Diagnosis not present

## 2017-04-24 DIAGNOSIS — K219 Gastro-esophageal reflux disease without esophagitis: Secondary | ICD-10-CM | POA: Insufficient documentation

## 2017-04-24 DIAGNOSIS — Z882 Allergy status to sulfonamides status: Secondary | ICD-10-CM | POA: Insufficient documentation

## 2017-04-24 DIAGNOSIS — M549 Dorsalgia, unspecified: Secondary | ICD-10-CM | POA: Insufficient documentation

## 2017-04-24 DIAGNOSIS — Z79899 Other long term (current) drug therapy: Secondary | ICD-10-CM | POA: Diagnosis not present

## 2017-04-24 DIAGNOSIS — Z886 Allergy status to analgesic agent status: Secondary | ICD-10-CM | POA: Insufficient documentation

## 2017-04-24 DIAGNOSIS — Z7951 Long term (current) use of inhaled steroids: Secondary | ICD-10-CM | POA: Insufficient documentation

## 2017-04-24 DIAGNOSIS — E039 Hypothyroidism, unspecified: Secondary | ICD-10-CM | POA: Insufficient documentation

## 2017-04-24 DIAGNOSIS — E1151 Type 2 diabetes mellitus with diabetic peripheral angiopathy without gangrene: Secondary | ICD-10-CM | POA: Diagnosis not present

## 2017-04-24 DIAGNOSIS — G4700 Insomnia, unspecified: Secondary | ICD-10-CM | POA: Insufficient documentation

## 2017-04-24 DIAGNOSIS — G8929 Other chronic pain: Secondary | ICD-10-CM | POA: Diagnosis not present

## 2017-04-24 DIAGNOSIS — K2970 Gastritis, unspecified, without bleeding: Secondary | ICD-10-CM | POA: Diagnosis not present

## 2017-04-24 DIAGNOSIS — K5900 Constipation, unspecified: Secondary | ICD-10-CM | POA: Insufficient documentation

## 2017-04-24 DIAGNOSIS — F419 Anxiety disorder, unspecified: Secondary | ICD-10-CM | POA: Diagnosis not present

## 2017-04-24 DIAGNOSIS — M109 Gout, unspecified: Secondary | ICD-10-CM | POA: Diagnosis not present

## 2017-04-24 DIAGNOSIS — Z79890 Hormone replacement therapy (postmenopausal): Secondary | ICD-10-CM | POA: Insufficient documentation

## 2017-04-24 DIAGNOSIS — Z7984 Long term (current) use of oral hypoglycemic drugs: Secondary | ICD-10-CM | POA: Diagnosis not present

## 2017-04-24 DIAGNOSIS — R110 Nausea: Secondary | ICD-10-CM | POA: Diagnosis present

## 2017-04-24 DIAGNOSIS — K30 Functional dyspepsia: Secondary | ICD-10-CM | POA: Diagnosis present

## 2017-04-24 DIAGNOSIS — Z79891 Long term (current) use of opiate analgesic: Secondary | ICD-10-CM | POA: Insufficient documentation

## 2017-04-24 DIAGNOSIS — F17200 Nicotine dependence, unspecified, uncomplicated: Secondary | ICD-10-CM | POA: Insufficient documentation

## 2017-04-24 HISTORY — DX: Enthesopathy, unspecified: M77.9

## 2017-04-24 HISTORY — DX: Hypothyroidism, unspecified: E03.9

## 2017-04-24 HISTORY — DX: Other specified disorders of bone, unspecified site: M89.8X9

## 2017-04-24 HISTORY — DX: Depression, unspecified: F32.A

## 2017-04-24 HISTORY — DX: Dysphagia, unspecified: R13.10

## 2017-04-24 HISTORY — DX: Benign neoplasm of colon, unspecified: D12.6

## 2017-04-24 HISTORY — PX: ESOPHAGOGASTRODUODENOSCOPY (EGD) WITH PROPOFOL: SHX5813

## 2017-04-24 HISTORY — DX: Other microscopic hematuria: R31.29

## 2017-04-24 HISTORY — DX: Other chronic pain: G89.29

## 2017-04-24 HISTORY — DX: Other specified abnormal findings of blood chemistry: R79.89

## 2017-04-24 HISTORY — DX: Major depressive disorder, single episode, unspecified: F32.9

## 2017-04-24 HISTORY — DX: Dorsalgia, unspecified: M54.9

## 2017-04-24 LAB — GLUCOSE, CAPILLARY: GLUCOSE-CAPILLARY: 100 mg/dL — AB (ref 65–99)

## 2017-04-24 SURGERY — ESOPHAGOGASTRODUODENOSCOPY (EGD) WITH PROPOFOL
Anesthesia: General

## 2017-04-24 MED ORDER — SODIUM CHLORIDE 0.9 % IV SOLN: INTRAVENOUS | Status: DC

## 2017-04-24 MED ORDER — PROPOFOL 500 MG/50ML IV EMUL
INTRAVENOUS | Status: AC
  Filled 2017-04-24: qty 50

## 2017-04-24 MED ORDER — PROPOFOL 10 MG/ML IV BOLUS
INTRAVENOUS | Status: DC | PRN
  Administered 2017-04-24: 70 mg via INTRAVENOUS

## 2017-04-24 MED ORDER — GLYCOPYRROLATE 0.2 MG/ML IJ SOLN
INTRAMUSCULAR | Status: DC | PRN
  Administered 2017-04-24: 0.2 mg via INTRAVENOUS

## 2017-04-24 MED ORDER — SODIUM CHLORIDE 0.9 % IV SOLN
INTRAVENOUS | Status: DC
  Administered 2017-04-24: 13:00:00 via INTRAVENOUS

## 2017-04-24 MED ORDER — PROPOFOL 500 MG/50ML IV EMUL
INTRAVENOUS | Status: DC | PRN
  Administered 2017-04-24: 150 ug/kg/min via INTRAVENOUS

## 2017-04-24 NOTE — Transfer of Care (Signed)
Immediate Anesthesia Transfer of Care Note  Patient: Chase Mendez  Procedure(s) Performed: ESOPHAGOGASTRODUODENOSCOPY (EGD) WITH PROPOFOL (N/A )  Patient Location: PACU  Anesthesia Type:General  Level of Consciousness: sedated  Airway & Oxygen Therapy: Patient Spontanous Breathing and Patient connected to nasal cannula oxygen  Post-op Assessment: Report given to RN and Post -op Vital signs reviewed and stable  Post vital signs: Reviewed and stable  Last Vitals:  Vitals:   04/24/17 1209  BP: 138/68  Pulse: 65  Resp: 16  Temp: (!) 36.4 C  SpO2: 95%    Last Pain:  Vitals:   04/24/17 1209  TempSrc: Tympanic         Complications: No apparent anesthesia complications

## 2017-04-24 NOTE — Anesthesia Post-op Follow-up Note (Signed)
Anesthesia QCDR form completed.        

## 2017-04-24 NOTE — H&P (Signed)
Primary Care Physician:  Jodi Marble, MD Primary Gastroenterologist:  Dr. Vira Agar  Pre-Procedure History & Physical: HPI:  Chase Mendez is a 83 y.o. male is here for an endoscopy.  This is for dyspepsia and chronic nausea   Past Medical History:  Diagnosis Date  . Anxiety   . Back pain   . Benign neoplasm of large bowel   . Capsulitis    fractured displaced metatarsal with capsulitis  . Chronic back pain   . Depression   . Diabetes mellitus without complication ([place])   . Dysphagia   . Exostosis    painful, right hallux  . GERD (gastroesophageal reflux disease)   . Gout   . Hypothyroidism   . Insomnia   . Low testosterone   . Microscopic hematuria   . Thyroid disease     Past Surgical History:  Procedure Laterality Date  . APPENDECTOMY    . BACK SURGERY    . CHOLECYSTECTOMY    . EYE SURGERY      Prior to Admission medications   Medication Sig Start Date End Date Taking? Authorizing Provider  albuterol (PROVENTIL HFA;VENTOLIN HFA) 108 (90 Base) MCG/ACT inhaler Inhale 2 puffs into the lungs every 6 (six) hours as needed for wheezing or shortness of breath. 08/21/16  Yes Darel Hong, MD  allopurinol (ZYLOPRIM) 100 MG tablet Take 100 mg by mouth daily. 10/26/14  Yes [provider]  atorvastatin (LIPITOR) 10 MG tablet Take 10 mg by mouth every evening. 04/08/16  Yes [provider]  citalopram (CELEXA) 20 MG tablet Take 20 mg by mouth daily.   Yes [provider]  Cyanocobalamin 1000 MCG/ML LIQD Take 1,000 mg by mouth daily.   Yes [provider]  docusate sodium (COLACE) 50 MG capsule Take 50 mg by mouth 2 (two) times daily.   Yes [provider]  fluticasone (FLONASE) 50 MCG/ACT nasal spray fluticasone propionate 50 mcg/act susp   Yes [provider]  gabapentin (NEURONTIN) 600 MG tablet Take 600 mg by mouth 4 (four) times daily. 11/26/14  Yes [provider]  isosorbide dinitrate (ISORDIL) 30 MG  tablet Take 30 mg by mouth 3 (three) times daily.   Yes [provider]  levothyroxine (SYNTHROID, LEVOTHROID) 150 MCG tablet Take 150 mcg by mouth every morning. 09/12/14  Yes [provider]  linaclotide (LINZESS) 290 MCG CAPS capsule Take 290 mcg by mouth daily before breakfast.   Yes [provider]  metFORMIN (GLUCOPHAGE) 500 MG tablet Take 500 mg by mouth every evening. 04/08/16  Yes [provider]  ondansetron (ZOFRAN ODT) 4 MG disintegrating tablet Take 1 tablet (4 mg total) by mouth every 8 (eight) hours as needed for nausea or vomiting. 08/21/16  Yes Darel Hong, MD  oxyCODONE (OXYCONTIN) 10 mg 12 hr tablet Take 15 mg by mouth every 12 (twelve) hours.   Yes [provider]  sucralfate (CARAFATE) 1 g tablet Take 1 g by mouth 4 (four) times daily -  with meals and at bedtime.   Yes [provider]  testosterone (ANDROGEL) 50 MG/5GM (1%) GEL Place onto the skin daily.   Yes [provider]  vitamin E 400 UNIT capsule Take 400 Units by mouth daily.   Yes [provider]  benzonatate (TESSALON) 100 MG capsule take 1 capsule by mouth three times a day 03/27/16   [provider]  Buprenorphine HCl-Naloxone HCl (SUBOXONE) 8-2 MG FILM Place under the tongue 2 (two) times daily.  [provider]  DULoxetine (CYMBALTA) 20 MG capsule Take 20 mg by mouth daily.    [provider]  meloxicam (MOBIC) 15 MG tablet Take 15 mg by mouth daily.    [provider]  metoCLOPramide (REGLAN) 10 MG tablet Take 1 tablet (10 mg total) by mouth every 8 (eight) hours as needed for up to 10 days for nausea or vomiting. 01/25/17 02/04/17  Arta Silence, MD  mirtazapine (REMERON) 45 MG tablet Take 1 tablet by mouth at bedtime. 11/26/14   [provider]  omeprazole (PRILOSEC) 20 MG capsule Take 20 mg by mouth daily.    [provider]  polyethylene glycol (MIRALAX / GLYCOLAX) packet Take 17  g by mouth daily.    [provider]  sildenafil (REVATIO) 20 MG tablet Take 20 mg by mouth 3 (three) times daily.    [provider]    Allergies as of 04/20/2017 - Review Complete 01/25/2017  Allergen Reaction Noted  . Ibuprofen Nausea Only 09/04/2013  . Lidocaine    . Sulfa antibiotics  12/07/2014    Family History  Problem Relation Age of Onset  . Heart disease Mother   . Diabetes Father     Social History   Socioeconomic History  . Marital status: Married    Spouse name: Not on file  . Number of children: Not on file  . Years of education: Not on file  . Highest education level: Not on file  Social Needs  . Financial resource strain: Not on file  . Food insecurity - worry: Not on file  . Food insecurity - inability: Not on file  . Transportation needs - medical: Not on file  . Transportation needs - non-medical: Not on file  Occupational History  . Not on file  Tobacco Use  . Smoking status: Current Every Day Smoker    Types: Cigars  . Smokeless tobacco: Never Used  Substance and Sexual Activity  . Alcohol use: No  . Drug use: No  . Sexual activity: Not on file  Other Topics Concern  . Not on file  Social History Narrative  . Not on file    Review of Systems: See HPI, otherwise negative ROS  Physical Exam: BP 138/68   Pulse 65   Temp (!) 97.5 F (36.4 C) (Tympanic)   Resp 16   Ht 6' (1.829 m)   Wt 78.9 kg (174 lb)   SpO2 95%   BMI 23.60 kg/m  General:   Alert,  pleasant and cooperative in NAD Head:  Normocephalic and atraumatic. Neck:  Supple; no masses or thyromegaly. Lungs:  Clear throughout to auscultation.    Heart:  Regular rate and rhythm. Abdomen:  Soft, nontender and nondistended. Normal bowel sounds, without guarding, and without rebound.   Neurologic:  Alert and  oriented x4;  grossly normal neurologically.  Impression/Plan: Chase Mendez is here for an endoscopy to be performed for dyspepsia and chronic  nausea.  Risks, benefits, limitations, and alternatives regarding  endoscopy have been reviewed with the patient.  Questions have been answered.  All parties agreeable.   Gaylyn Cheers, MD  04/24/2017, 12:49 PM

## 2017-04-24 NOTE — Anesthesia Procedure Notes (Signed)
Performed by: Wandalee Klang, CRNA Pre-anesthesia Checklist: Patient identified, Emergency Drugs available, Suction available, Patient being monitored and Timeout performed Patient Re-evaluated:Patient Re-evaluated prior to induction Oxygen Delivery Method: Nasal cannula Induction Type: IV induction       

## 2017-04-24 NOTE — Anesthesia Preprocedure Evaluation (Addendum)
Anesthesia Evaluation  Patient identified by MRN, date of birth, ID band Patient awake    Reviewed: Allergy & Precautions, NPO status , Patient's Chart, lab work & pertinent test results, reviewed documented beta blocker date and time   Airway Mallampati: II  TM Distance: >3 FB     Dental  (+) Upper Dentures, Lower Dentures   Pulmonary pneumonia, resolved, Current Smoker,           Cardiovascular + Peripheral Vascular Disease       Neuro/Psych PSYCHIATRIC DISORDERS Anxiety Depression    GI/Hepatic   Endo/Other  diabetes, Type 2Hypothyroidism   Renal/GU Renal disease     Musculoskeletal   Abdominal   Peds  Hematology   Anesthesia Other Findings   Reproductive/Obstetrics                            Anesthesia Physical Anesthesia Plan  ASA: III  Anesthesia Plan: General   Post-op Pain Management:    Induction: Intravenous  PONV Risk Score and Plan:   Airway Management Planned:   Additional Equipment:   Intra-op Plan:   Post-operative Plan:   Informed Consent: I have reviewed the patients History and Physical, chart, labs and discussed the procedure including the risks, benefits and alternatives for the proposed anesthesia with the patient or authorized representative who has indicated his/her understanding and acceptance.     Plan Discussed with: CRNA  Anesthesia Plan Comments:         Anesthesia Quick Evaluation

## 2017-04-24 NOTE — Op Note (Signed)
[facility] Patient Name: Chase Mendez Procedure Date: 04/24/2017 12:36 PM MRN: [id number] Account #: [id number] Date of Birth: 01/01/1935 Admit Type: Outpatient Age: 83 Room: [facility] ENDO ROOM 1 Gender: Male Note Status: Finalized Procedure:            Upper GI endoscopy Indications:          Dyspepsia Providers:            Manya Silvas, MD Referring MD:         Venetia Maxon. Elijio Miles, MD (Referring MD) Medicines:            Propofol per Anesthesia Complications:        No immediate complications. Procedure:            Pre-Anesthesia Assessment:                       - After reviewing the risks and benefits, the patient                        was deemed in satisfactory condition to undergo the                        procedure.                       After obtaining informed consent, the endoscope was                        passed under direct vision. Throughout the procedure,                        the patient's blood pressure, pulse, and oxygen                        saturations were monitored continuously. The Endoscope                        was introduced through the mouth, and advanced to the                        second part of duodenum. The upper GI endoscopy was                        accomplished without difficulty. The patient tolerated                        the procedure well. Findings:      There were esophageal mucosal changes suspicious for short-segment       Barrett's esophagus present at the gastroesophageal junction. The       maximum longitudinal extent of these mucosal changes was 1- 2 cm in       length. Mucosa was biopsied with a cold forceps for histology. One       specimen bottle was sent to pathology.      Diffuse mildly erythematous mucosa without bleeding was found in the       gastric body and in the gastric antrum. This is likely the source of his       nausea, vomiting and abdf pain. Biopsies were taken with a  cold forceps       for histology. Biopsies were taken with  a cold forceps for Helicobacter       pylori testing.      The examined duodenum was normal. Impression:           - Esophageal mucosal changes suspicious for                        short-segment Barrett's esophagus. Biopsied.                       - Erythematous mucosa in the gastric body and antrum.                        Biopsied.                       - Normal examined duodenum. Recommendation:       - Await pathology results. Manya Silvas, MD 04/24/2017 1:12:00 PM This report has been signed electronically. Number of Addenda: 0 Note Initiated On: 04/24/2017 12:36 PM      [facility]

## 2017-04-24 NOTE — Anesthesia Postprocedure Evaluation (Signed)
Anesthesia Post Note  Patient: Chase Mendez  Procedure(s) Performed: ESOPHAGOGASTRODUODENOSCOPY (EGD) WITH PROPOFOL (N/A )  Patient location during evaluation: Endoscopy Anesthesia Type: General Level of consciousness: awake and alert and oriented Pain management: pain level controlled Vital Signs Assessment: post-procedure vital signs reviewed and stable Respiratory status: spontaneous breathing, nonlabored ventilation and respiratory function stable Cardiovascular status: blood pressure returned to baseline and stable Postop Assessment: no signs of nausea or vomiting Anesthetic complications: no     Last Vitals:  Vitals:   04/24/17 1320 04/24/17 1330  BP: 111/66 135/69  Pulse: 67 66  Resp: (!) 23 20  Temp:    SpO2: 93% 93%    Last Pain:  Vitals:   04/24/17 1311  TempSrc: Tympanic                 Kala Gassmann

## 2017-04-25 ENCOUNTER — Encounter: Payer: Self-pay | Admitting: Unknown Physician Specialty

## 2017-04-26 LAB — SURGICAL PATHOLOGY

## 2017-05-14 ENCOUNTER — Ambulatory Visit: Payer: Medicare Other | Admitting: Pain Medicine

## 2017-05-16 ENCOUNTER — Encounter: Payer: Self-pay | Admitting: Pain Medicine

## 2017-05-16 ENCOUNTER — Ambulatory Visit: Payer: PRIVATE HEALTH INSURANCE | Attending: Pain Medicine | Admitting: Pain Medicine

## 2017-05-16 ENCOUNTER — Other Ambulatory Visit: Payer: Self-pay

## 2017-05-16 DIAGNOSIS — Z79899 Other long term (current) drug therapy: Secondary | ICD-10-CM | POA: Diagnosis not present

## 2017-05-16 DIAGNOSIS — M79605 Pain in left leg: Secondary | ICD-10-CM

## 2017-05-16 DIAGNOSIS — Z9049 Acquired absence of other specified parts of digestive tract: Secondary | ICD-10-CM | POA: Insufficient documentation

## 2017-05-16 DIAGNOSIS — M5442 Lumbago with sciatica, left side: Secondary | ICD-10-CM

## 2017-05-16 DIAGNOSIS — K5900 Constipation, unspecified: Secondary | ICD-10-CM | POA: Insufficient documentation

## 2017-05-16 DIAGNOSIS — J189 Pneumonia, unspecified organism: Secondary | ICD-10-CM | POA: Insufficient documentation

## 2017-05-16 DIAGNOSIS — Z886 Allergy status to analgesic agent status: Secondary | ICD-10-CM | POA: Diagnosis not present

## 2017-05-16 DIAGNOSIS — Z882 Allergy status to sulfonamides status: Secondary | ICD-10-CM | POA: Insufficient documentation

## 2017-05-16 DIAGNOSIS — M961 Postlaminectomy syndrome, not elsewhere classified: Secondary | ICD-10-CM | POA: Diagnosis not present

## 2017-05-16 DIAGNOSIS — Z7982 Long term (current) use of aspirin: Secondary | ICD-10-CM | POA: Insufficient documentation

## 2017-05-16 DIAGNOSIS — M5417 Radiculopathy, lumbosacral region: Secondary | ICD-10-CM | POA: Diagnosis not present

## 2017-05-16 DIAGNOSIS — F419 Anxiety disorder, unspecified: Secondary | ICD-10-CM | POA: Diagnosis not present

## 2017-05-16 DIAGNOSIS — E039 Hypothyroidism, unspecified: Secondary | ICD-10-CM | POA: Insufficient documentation

## 2017-05-16 DIAGNOSIS — G8929 Other chronic pain: Secondary | ICD-10-CM | POA: Diagnosis not present

## 2017-05-16 DIAGNOSIS — G629 Polyneuropathy, unspecified: Secondary | ICD-10-CM | POA: Diagnosis not present

## 2017-05-16 DIAGNOSIS — Z9889 Other specified postprocedural states: Secondary | ICD-10-CM | POA: Insufficient documentation

## 2017-05-16 DIAGNOSIS — N179 Acute kidney failure, unspecified: Secondary | ICD-10-CM | POA: Diagnosis not present

## 2017-05-16 DIAGNOSIS — M109 Gout, unspecified: Secondary | ICD-10-CM | POA: Diagnosis not present

## 2017-05-16 DIAGNOSIS — M21372 Foot drop, left foot: Secondary | ICD-10-CM | POA: Diagnosis not present

## 2017-05-16 DIAGNOSIS — F329 Major depressive disorder, single episode, unspecified: Secondary | ICD-10-CM | POA: Diagnosis not present

## 2017-05-16 DIAGNOSIS — Z789 Other specified health status: Secondary | ICD-10-CM

## 2017-05-16 DIAGNOSIS — K219 Gastro-esophageal reflux disease without esophagitis: Secondary | ICD-10-CM | POA: Diagnosis not present

## 2017-05-16 DIAGNOSIS — N200 Calculus of kidney: Secondary | ICD-10-CM | POA: Insufficient documentation

## 2017-05-16 DIAGNOSIS — G9340 Encephalopathy, unspecified: Secondary | ICD-10-CM | POA: Diagnosis not present

## 2017-05-16 DIAGNOSIS — M899 Disorder of bone, unspecified: Secondary | ICD-10-CM

## 2017-05-16 NOTE — Patient Instructions (Signed)

## 2017-05-16 NOTE — Progress Notes (Signed)
Patient's Name: Chase Mendez  MRN: [id number]  Referring Provider: Jodi Marble, MD  DOB: 07/29/1934  PCP: Jodi Marble, MD  DOS: 05/16/2017  Note by: Gaspar Cola, MD  Service setting: Ambulatory outpatient  Specialty: Interventional Pain Management  Location: [facility] (AMB) Pain Management Facility  Visit type: Initial Patient Evaluation  Patient type: New Patient   Primary Reason(s) for Visit: Encounter for initial evaluation of one or more chronic problems (new to examiner) potentially causing chronic pain, and posing a threat to normal musculoskeletal function. (Level of risk: High) CC: Back Pain (left side)  HPI  Mr. Meidinger is a 83 y.o. year old, male patient, who comes today to see [place] for the first time for an initial evaluation of his chronic pain. He has Pneumonia; Sepsis ([place]); ARF (acute renal failure) ([place]); Acute encephalopathy; Pressure ulcer; Spinal accessory neuropathy; Varicose veins of both lower extremities with pain; Lower extremity pain, bilateral; Venous reflux; Lower limb pain, inferior, left; Chronic bilateral low back pain with left-sided sciatica; Chronic pain of left lower extremity; Failed back surgical syndrome; Foot drop, left foot; Lumbosacral radiculopathy at L5; Disorder of skeletal system; Pharmacologic therapy; and Problems influencing health status on their problem list. Today he comes in for evaluation of his Back Pain (left side)  Pain Assessment: Location: Lower Back Radiating: Radiates down left leg to foot  Onset: More than a month ago Duration: Chronic pain Quality: Burning, Stabbing, Cramping, Discomfort, Constant Severity: 7 /10 (self-reported pain score)  Note: Reported level is inconsistent with clinical observations. Clinically the patient looks like a 2/10 A 2/10 is viewed as "Mild to Moderate" and described as noticeable and distracting. Impossible to hide from other people. More frequent flare-ups. Still possible to adapt and  function close to normal. It can be very annoying and may have occasional stronger flare-ups. With discipline, patients may get used to it and adapt. Information on the proper use of the pain scale provided to the patient today. When using our objective Pain Scale, levels between 6 and 10/10 are said to belong in an emergency room, as it progressively worsens from a 6/10, described as severely limiting, requiring emergency care not usually available at an outpatient pain management facility. At a 6/10 level, communication becomes difficult and requires great effort. Assistance to reach the emergency department may be required. Facial flushing and profuse sweating along with potentially dangerous increases in heart rate and blood pressure will be evident. Effect on ADL: not able to do household chores, not able to drive at times Timing: Constant Modifying factors: lay down most of the day  Onset and Duration: Present longer than 3 months Cause of pain: Work related accident or event Severity: NAS-11 at its worse: 9/10, NAS-11 at its best: 7/10 and NAS-11 on the average: 7/10 Timing: Morning, Night, During activity or exercise and After activity or exercise Aggravating Factors: Bending, Climbing, Lifiting, Nerve blocks, Prolonged sitting, Prolonged standing, Twisting, Walking uphill and Working Alleviating Factors: Lying down, Medications, Resting, Sleeping and TENS Associated Problems: Constipation, Day-time cramps, Night-time cramps, Depression, Erectile dysfunction, Nausea, Numbness, Spasms and Tingling Quality of Pain: Aching, Agonizing, Annoying, Burning, Constant, Cramping, Deep, Dull, Nagging, Sharp, Shooting, Stabbing, Throbbing and Tingling Previous Examinations or Tests: MRI scan and Nerve block Previous Treatments: Epidural steroid injections, Facet blocks, Narcotic medications, Physical Therapy, Radiofrequency and TENS  The patient comes into the clinics today for the first time for a  chronic pain management evaluation.  This patient is known to me  as he was a prior clinic patient.  His primary problem seems to be low back pain and lower extremity pain secondary to a failed back surgical syndrome.  He has a long-standing history of managing his pain with opioids.  It has been a long time since he took a "drug holiday".  Currently he is being managed by the [facility].  He was interested in going back on his methadone.  However, he is 83 years old and has began to experience cardiac problems which makes him a poor candidate to go back on a medication that can cause prolongation of the QT interval.  Today I took the time to provide the patient with information regarding my pain practice. The patient was informed that my practice is divided into two sections: an interventional pain management section, as well as a completely separate and distinct medication management section. I explained that I have procedure days for my interventional therapies, and evaluation days for follow-ups and medication management. Because of the amount of documentation required during both, they are kept separated. This means that there is the possibility that he may be scheduled for a procedure on one day, and medication management the next. I have also informed him that because of staffing and facility limitations, I no longer take patients for medication management only. To illustrate the reasons for this, I gave the patient the example of surgeons, and how inappropriate it would be to refer a patient to his/her care, just to write for the post-surgical antibiotics on a surgery done by a different surgeon.   Because interventional pain management is my board-certified specialty, the patient was informed that joining my practice means that they are open to any and all interventional therapies. I made it clear that this does not mean that they will be forced to have any procedures done. What this means is that  I believe interventional therapies to be essential part of the diagnosis and proper management of chronic pain conditions. Therefore, patients not interested in these interventional alternatives will be better served under the care of a different practitioner.  The patient was also made aware of my Comprehensive Pain Management Safety Guidelines where by joining my practice, they limit all of their nerve blocks and joint injections to those done by our practice, for as long as we are retained to manage their care.   Historic Controlled Substance Pharmacotherapy Review  PMP and historical list of controlled substances: Oxycodone IR 15 mg 1 tablet p.o. 4 times daily; buprenorphine/naloxone 8/2 mg sublingual; Suboxone 8/2 mg sublingual; methadone 10 mg 1 tablet p.o. 3 times daily; diazepam 10 mg PRN Highest opioid analgesic regimen found: Methadone 10 mg p.o. 3 times daily; oxycodone IR 15 mg p.o. 4 times daily. Most recent opioid analgesic: Oxycodone IR 15 mg p.o. 4 times daily Current opioid analgesics: Oxycodone IR 15 mg p.o. 4 times daily Highest recorded MME/day: 90 mg/day MME/day: 90 mg/day Medications: The patient did not bring the medication(s) to the appointment, as requested in our "New Patient Package" Pharmacodynamics: Desired effects: Analgesia: The patient reports >50% benefit. Reported improvement in function: The patient reports medication allows him to accomplish basic ADLs. Clinically meaningful improvement in function (CMIF): Sustained CMIF goals met Perceived effectiveness: Described as relatively effective, allowing for increase in activities of daily living (ADL) Undesirable effects: Side-effects or Adverse reactions: None reported Historical Monitoring: The patient  reports that he does not use drugs. List of all UDS Test(s): No results found for: MDMA,  COCAINSCRNUR, PCPSCRNUR, [place], CANNABQUANT, THCU, [place] List of other Serum/Urine Drug Screening Test(s):  No results  found for: AMPHSCRSER, BARBSCRSER, BENZOSCRSER, COCAINSCRSER, COCAINSCRNUR, PCPSCRSER, PCPQUANT, THCSCRSER, THCU, CANNABQUANT, OPIATESCRSER, OXYSCRSER, PROPOXSCRSER, ETH Historical Background Evaluation: [place] PMP: Six (6) year initial data search conducted.             PMP NARX Score Report:  Narcotic: 451 Sedative: 200 Stimulant: 000  Department of public safety, offender search: Editor, commissioning Information) Non-contributory Risk Assessment Profile: Aberrant behavior: None observed or detected today Risk factors for fatal opioid overdose: None identified today PMP NARX Overdose Risk Score: 490 Fatal overdose hazard ratio (HR): Calculation deferred Non-fatal overdose hazard ratio (HR): Calculation deferred Risk of opioid abuse or dependence: 0.7-3.0% with doses ? 36 MME/day and 6.1-26% with doses ? 120 MME/day. Substance use disorder (SUD) risk level: Pending results of Medical Psychology Evaluation for SUD Opioid risk tool (ORT) (Total Score):    ORT Scoring interpretation table:  Score <3 = Low Risk for SUD  Score between 4-7 = Moderate Risk for SUD  Score >8 = High Risk for Opioid Abuse   PHQ-2 Depression Scale:  Total score: 6  PHQ-2 Scoring interpretation table: (Score and probability of major depressive disorder)  Score 0 = No depression  Score 1 = 15.4% Probability  Score 2 = 21.1% Probability  Score 3 = 38.4% Probability  Score 4 = 45.5% Probability  Score 5 = 56.4% Probability  Score 6 = 78.6% Probability   PHQ-9 Depression Scale:  Total score: 7  PHQ-9 Scoring interpretation table:  Score 0-4 = No depression  Score 5-9 = Mild depression  Score 10-14 = Moderate depression  Score 15-19 = Moderately severe depression  Score 20-27 = Severe depression (2.4 times higher risk of SUD and 2.89 times higher risk of overuse)   Pharmacologic Plan: As per protocol, I have not taken over any controlled substance management, pending the results of ordered tests and/or consults.             Initial impression: Pending review of available data and ordered tests.  Meds   Current Outpatient Medications:  .  allopurinol (ZYLOPRIM) 100 MG tablet, Take 100 mg by mouth daily., Disp: , Rfl: 0 .  aspirin EC 81 MG tablet, Take 81 mg by mouth daily., Disp: , Rfl:  .  atorvastatin (LIPITOR) 10 MG tablet, Take 10 mg by mouth every evening., Disp: , Rfl: 0 .  benzonatate (TESSALON) 100 MG capsule, take 1 capsule by mouth three times a day, Disp: , Rfl: 0 .  citalopram (CELEXA) 20 MG tablet, Take 20 mg by mouth daily., Disp: , Rfl:  .  Cyanocobalamin 1000 MCG/ML LIQD, Take 1,000 mg by mouth daily., Disp: , Rfl:  .  docusate sodium (COLACE) 50 MG capsule, Take 50 mg by mouth 2 (two) times daily., Disp: , Rfl:  .  fluticasone (FLONASE) 50 MCG/ACT nasal spray, fluticasone propionate 50 mcg/act susp, Disp: , Rfl:  .  gabapentin (NEURONTIN) 600 MG tablet, Take 600 mg by mouth 4 (four) times daily., Disp: , Rfl: 0 .  isosorbide dinitrate (ISORDIL) 30 MG tablet, Take 30 mg by mouth 3 (three) times daily., Disp: , Rfl:  .  levothyroxine (SYNTHROID, LEVOTHROID) 150 MCG tablet, Take 150 mcg by mouth every morning., Disp: , Rfl: 0 .  linaclotide (LINZESS) 290 MCG CAPS capsule, Take 290 mcg by mouth daily before breakfast., Disp: , Rfl:  .  losartan (COZAAR) 50 MG tablet, Take 50 mg by mouth daily.,  Disp: , Rfl:  .  metFORMIN (GLUCOPHAGE) 500 MG tablet, Take 500 mg by mouth every evening., Disp: , Rfl: 0 .  mirabegron ER (MYRBETRIQ) 25 MG TB24 tablet, Take 25 mg by mouth daily., Disp: , Rfl:  .  omeprazole (PRILOSEC) 20 MG capsule, Take 20 mg by mouth daily., Disp: , Rfl:  .  ondansetron (ZOFRAN ODT) 4 MG disintegrating tablet, Take 1 tablet (4 mg total) by mouth every 8 (eight) hours as needed for nausea or vomiting., Disp: 20 tablet, Rfl: 0 .  oxyCODONE (OXYCONTIN) 10 mg 12 hr tablet, Take 15 mg by mouth every 12 (twelve) hours., Disp: , Rfl:  .  sucralfate (CARAFATE) 1 g tablet, Take 1 g by mouth 4  (four) times daily -  with meals and at bedtime., Disp: , Rfl:  .  testosterone (ANDROGEL) 50 MG/5GM (1%) GEL, Place onto the skin daily., Disp: , Rfl:  .  vitamin E 400 UNIT capsule, Take 400 Units by mouth daily., Disp: , Rfl:  .  diazepam (VALIUM) 5 MG tablet, Take 1 tablet (5 mg total) by mouth as needed for up to 2 doses for anxiety (Take one tab 45 minutes before MRI. Take second tablet just prior to MRI scan). Do not take medication within 4 hours of taking opioid pain medications. Must have a driver. Do not drive or operate machinery x 24 hours after taking this medication., Disp: 2 tablet, Rfl: 0 .  metoCLOPramide (REGLAN) 10 MG tablet, Take 1 tablet (10 mg total) by mouth every 8 (eight) hours as needed for up to 10 days for nausea or vomiting., Disp: 15 tablet, Rfl: 1  Imaging Review   Complexity Note: No results found under the [employer] electronic medical record.                         ROS  Cardiovascular: Daily Aspirin intake and Blood thinners:  Anticoagulant Pulmonary or Respiratory: Smoking Neurological: Curved spine Review of Past Neurological Studies:  Results for orders placed or performed during the hospital encounter of 01/25/17  CT Head Wo Contrast   Narrative   CLINICAL DATA:  83 year old male with severe headache for 2 days.  EXAM: CT HEAD WITHOUT CONTRAST  TECHNIQUE: Contiguous axial images were obtained from the base of the skull through the vertex without intravenous contrast.  COMPARISON:  12/07/2014 and prior CTs  FINDINGS: Brain: No evidence of acute infarction, hemorrhage, hydrocephalus, extra-axial collection or mass lesion/mass effect.  Atrophy and chronic small-vessel white matter ischemic changes are again noted.  Vascular: Atherosclerotic calcifications noted  Skull: Normal. Negative for fracture or focal lesion.  Sinuses/Orbits: No acute finding.  Other: None.  IMPRESSION: 1. No evidence of acute intracranial abnormality 2.  Atrophy and chronic small-vessel white matter ischemic changes.   Electronically Signed   By: Margarette Canada M.D.   On: 01/25/2017 20:57    Psychological-Psychiatric: Anxiousness and Depressed Gastrointestinal: Reflux or heatburn and Irregular, infrequent bowel movements (Constipation) Genitourinary: No reported renal or genitourinary signs or symptoms such as difficulty voiding or producing urine, peeing blood, non-functioning kidney, kidney stones, difficulty emptying the bladder, difficulty controlling the flow of urine, or chronic kidney disease Hematological: No reported hematological signs or symptoms such as prolonged bleeding, low or poor functioning platelets, bruising or bleeding easily, hereditary bleeding problems, low energy levels due to low hemoglobin or being anemic Endocrine: High blood sugar requiring insulin (IDDM) and Slow thyroid Rheumatologic: No reported rheumatological signs and symptoms  such as fatigue, joint pain, tenderness, swelling, redness, heat, stiffness, decreased range of motion, with or without associated rash Musculoskeletal: Negative for myasthenia gravis, muscular dystrophy, multiple sclerosis or malignant hyperthermia Work History: Retired  Allergies  Mr. Kamau is allergic to ibuprofen; lidocaine; sulfa antibiotics; and sulfasalazine.  Laboratory Chemistry  Inflammation Markers (CRP: Acute Phase) (ESR: Chronic Phase) Lab Results  Component Value Date   LATICACIDVEN 1.1 12/08/2014                         Renal Function Markers Lab Results  Component Value Date   BUN 16 01/25/2017   CREATININE 0.98 01/25/2017   GFRAA >60 01/25/2017   GFRNONAA >60 01/25/2017                              Hepatic Function Markers Lab Results  Component Value Date   AST 28 08/21/2016   ALT 18 08/21/2016   ALBUMIN 4.1 08/21/2016   ALKPHOS 112 08/21/2016   LIPASE 37 08/21/2016                        Electrolytes Lab Results  Component Value Date   NA 137  01/25/2017   K 3.2 (L) 01/25/2017   CL 99 (L) 01/25/2017   CALCIUM 9.2 01/25/2017                        Neuropathy Markers Lab Results  Component Value Date   HGBA1C 6.3 (H) 12/08/2014                        Coagulation Parameters Lab Results  Component Value Date   PLT 169 01/25/2017                        Cardiovascular Markers Lab Results  Component Value Date   TROPONINI <0.03 01/25/2017   HGB 16.6 01/25/2017   HCT 47.0 01/25/2017                         Note: Lab results reviewed.  [place]  Drug: Mr. Corkery  reports that he does not use drugs. Alcohol:  reports that he does not drink alcohol. Tobacco:  reports that he has been smoking cigars.  He has never used smokeless tobacco. Medical:  has a past medical history of Anxiety, Back pain, Benign neoplasm of large bowel, Capsulitis, Chronic back pain, Depression, Diabetes mellitus without complication ([place]), Dysphagia, Exostosis, GERD (gastroesophageal reflux disease), Gout, Hypothyroidism, Insomnia, Low testosterone, Microscopic hematuria, and Thyroid disease. Family: family history includes Diabetes in his father; Heart disease in his mother.  Past Surgical History:  Procedure Laterality Date  . APPENDECTOMY    . BACK SURGERY    . CHOLECYSTECTOMY    . ESOPHAGOGASTRODUODENOSCOPY (EGD) WITH PROPOFOL N/A 04/24/2017   Procedure: ESOPHAGOGASTRODUODENOSCOPY (EGD) WITH PROPOFOL;  Surgeon: Manya Silvas, MD;  Location: [facility] ENDOSCOPY;  Service: Endoscopy;  Laterality: N/A;  . EYE SURGERY     Active Ambulatory Problems    Diagnosis Date Noted  . Pneumonia 12/07/2014  . Sepsis ([place]) 12/08/2014  . ARF (acute renal failure) ([place]) 12/08/2014  . Acute encephalopathy 12/08/2014  . Pressure ulcer 12/09/2014  . 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 Varicose veins of both lower extremities with pain 05/09/2016  . Lower  extremity pain, bilateral 05/09/2016  . Venous reflux 07/12/2016  . Lower limb pain, inferior, left  05/16/2017  . Chronic bilateral low back pain with left-sided sciatica 05/16/2017  . Chronic pain of left lower extremity 05/16/2017  . 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 Foot drop, left foot 05/16/2017  . Lumbosacral radiculopathy at L5 05/16/2017  . Disorder of skeletal system 05/16/2017  . Pharmacologic therapy 05/16/2017  . Problems influencing health status 05/16/2017   Resolved Ambulatory Problems    Diagnosis Date Noted  . No Resolved Ambulatory Problems   Past Medical History:  Diagnosis Date  . Anxiety   . Back pain   . Benign neoplasm of large bowel   . Capsulitis   . Chronic back pain   . Depression   . Diabetes mellitus without complication ([place])   . Dysphagia   . Exostosis   . GERD (gastroesophageal reflux disease)   . Gout   . Hypothyroidism   . Insomnia   . Low testosterone   . Microscopic hematuria   . Thyroid disease    Constitutional Exam  General appearance: Well nourished, well developed, and well hydrated. In no apparent acute distress Vitals:   05/16/17 1107  BP: (!) 157/90  Pulse: 73  Temp: 98.2 F (36.8 C)  SpO2: 93%  Weight: 182 lb (82.6 kg)  Height: 6' (1.829 m)   BMI Assessment: Estimated body mass index is 24.68 kg/m as calculated from the following:   Height as of this encounter: 6' (1.829 m).   Weight as of this encounter: 182 lb (82.6 kg).  BMI interpretation table: BMI level Category Range association with higher incidence of chronic pain  <18 kg/m2 Underweight   18.5-24.9 kg/m2 Ideal body weight   25-29.9 kg/m2 Overweight Increased incidence by 20%  30-34.9 kg/m2 Obese (Class I) Increased incidence by 68%  35-39.9 kg/m2 Severe obesity (Class II) Increased incidence by 136%  >40 kg/m2 Extreme obesity (Class III) Increased incidence by 254%   BMI Readings from Last 4 Encounters:  05/16/17 24.68 kg/m  04/24/17 23.60 kg/m  01/25/17 24.55 kg/m  09/14/16 25.24 kg/m   Wt Readings from Last 4 Encounters:  05/16/17  182 lb (82.6 kg)  04/24/17 174 lb (78.9 kg)  01/25/17 181 lb (82.1 kg)  09/14/16 181 lb (82.1 kg)  Psych/Mental status: Alert, oriented x 3 (person, place, & time)       Eyes: PERLA Respiratory: No evidence of acute respiratory distress  Cervical Spine Area Exam  Skin & Axial Inspection: No masses, redness, edema, swelling, or associated skin lesions Alignment: Symmetrical Functional ROM: Unrestricted ROM      Stability: No instability detected Muscle Tone/Strength: Functionally intact. No obvious neuro-muscular anomalies detected. Sensory (Neurological): Unimpaired Palpation: No palpable anomalies              Upper Extremity (UE) Exam    Side: Right upper extremity  Side: Left upper extremity  Skin & Extremity Inspection: Skin color, temperature, and hair growth are WNL. No peripheral edema or cyanosis. No masses, redness, swelling, asymmetry, or associated skin lesions. No contractures.  Skin & Extremity Inspection: Skin color, temperature, and hair growth are WNL. No peripheral edema or cyanosis. No masses, redness, swelling, asymmetry, or associated skin lesions. No contractures.  Functional ROM: Unrestricted ROM          Functional ROM: Unrestricted ROM          Muscle Tone/Strength: Functionally intact. No obvious neuro-muscular anomalies detected.  Muscle Tone/Strength: Functionally intact.  No obvious neuro-muscular anomalies detected.  Sensory (Neurological): Unimpaired          Sensory (Neurological): Unimpaired          Palpation: No palpable anomalies              Palpation: No palpable anomalies              Specialized Test(s): Deferred         Specialized Test(s): Deferred          Thoracic Spine Area Exam  Skin & Axial Inspection: No masses, redness, or swelling Alignment: Symmetrical Functional ROM: Unrestricted ROM Stability: No instability detected Muscle Tone/Strength: Functionally intact. No obvious neuro-muscular anomalies detected. Sensory (Neurological):  Unimpaired Muscle strength & Tone: No palpable anomalies  Lumbar Spine Area Exam  Skin & Axial Inspection: No masses, redness, or swelling Alignment: Symmetrical Functional ROM: Unrestricted ROM      Stability: No instability detected Muscle Tone/Strength: Functionally intact. No obvious neuro-muscular anomalies detected. Sensory (Neurological): Unimpaired Palpation: No palpable anomalies       Provocative Tests: Lumbar Hyperextension and rotation test: evaluation deferred today       Lumbar Lateral bending test: evaluation deferred today       Patrick's Maneuver: evaluation deferred today                    Gait & Posture Assessment  Ambulation: Unassisted Gait: Relatively normal for age and body habitus Posture: WNL   Lower Extremity Exam    Side: Right lower extremity  Side: Left lower extremity  Skin & Extremity Inspection: Skin color, temperature, and hair growth are WNL. No peripheral edema or cyanosis. No masses, redness, swelling, asymmetry, or associated skin lesions. No contractures.  Skin & Extremity Inspection: Skin color, temperature, and hair growth are WNL. No peripheral edema or cyanosis. No masses, redness, swelling, asymmetry, or associated skin lesions. No contractures.  Functional ROM: Unrestricted ROM          Functional ROM: Unrestricted ROM          Muscle Tone/Strength: Functionally intact. No obvious neuro-muscular anomalies detected.  Muscle Tone/Strength: Functionally intact. No obvious neuro-muscular anomalies detected.  Sensory (Neurological): Unimpaired  Sensory (Neurological): Unimpaired  Palpation: No palpable anomalies  Palpation: No palpable anomalies   Assessment  Primary Diagnosis & Pertinent Problem List: Diagnoses of Chronic bilateral low back pain with left-sided sciatica, Chronic pain of left lower extremity, Failed back surgical syndrome, Foot drop, left foot, Lumbosacral radiculopathy at L5, Disorder of skeletal system, Pharmacologic therapy,  and Problems influencing health status were pertinent to this visit.  Visit Diagnosis (New problems to examiner): 1. Chronic bilateral low back pain with left-sided sciatica   2. Chronic pain of left lower extremity   3. Failed back surgical syndrome   4. Foot drop, left foot   5. Lumbosacral radiculopathy at L5   6. Disorder of skeletal system   7. Pharmacologic therapy   8. Problems influencing health status    Plan of Care (Initial workup plan)  Note: Mr. Klumpp was reminded that as per protocol, today's visit has been an evaluation only. We have not taken over the patient's controlled substance management.  Problem-specific plan: No problem-specific Assessment & Plan notes found for this encounter.  Lab Orders  No laboratory test(s) ordered today    Imaging Orders     MR LUMBAR SPINE WO CONTRAST Referral Orders  No referral(s) requested today   Procedure Orders    No procedure(s)  ordered today   Pharmacotherapy (current): Medications ordered:  No orders of the defined types were placed in this encounter.  Medications administered during this visit: Evert Kohl. Gibbs had no medications administered during this visit.   Pharmacological management options:  Opioid Analgesics: The patient was informed that there is no guarantee that he would be a candidate for opioid analgesics. The decision will be made following CDC guidelines. This decision will be based on the results of diagnostic studies, as well as Mr. [place] risk profile.   Membrane stabilizer: To be determined at a later time  Muscle relaxant: To be determined at a later time  NSAID: To be determined at a later time  Other analgesic(s): To be determined at a later time   Interventional management options: Mr. Litzau was informed that there is no guarantee that he would be a candidate for interventional therapies. The decision will be based on the results of diagnostic studies, as well as Mr. Mans risk  profile.  Procedure(s) under consideration:  Diagnostic caudal epidural steroid injection + epidurogram  Possible Racz procedure  Diagnostic bilateral lumbar facet block  Possible bilateral lumbar facet RFA  Possible candidate for intrathecal pump trial and implant  Possible candidate for spinal cord stimulator trial and implant    Provider-requested follow-up: Return for F/U eval (after test completion).  Future Appointments  Date Time Provider [place]  05/24/2017 11:00 AM [facility]  05/30/2017 10:30 AM Milinda Pointer, MD [facility] None    Primary Care Physician: Jodi Marble, MD Location: [facility] Outpatient Pain Management Facility Note by: Gaspar Cola, MD Date: 05/16/2017; Time: 12:18 PM

## 2017-05-17 ENCOUNTER — Telehealth: Payer: Self-pay | Admitting: Pain Medicine

## 2017-05-17 ENCOUNTER — Other Ambulatory Visit: Payer: Self-pay | Admitting: Nurse Practitioner

## 2017-05-17 MED ORDER — DIAZEPAM 5 MG PO TABS: 5.0000 mg | ORAL_TABLET | ORAL | 0 refills | Status: DC | PRN

## 2017-05-17 NOTE — Telephone Encounter (Signed)
Called patient to inform to pick up prescription for his MRi

## 2017-05-17 NOTE — Telephone Encounter (Signed)
Crystal, can you do this?

## 2017-05-17 NOTE — Telephone Encounter (Signed)
Diazepam order in

## 2017-05-17 NOTE — Telephone Encounter (Signed)
Patient is scheduled for MRI and will need something prescribed to help with claustrophobia to get this done. Please let patient know when he can pick up  MRI is scheduled 05-24-17

## 2017-05-24 ENCOUNTER — Ambulatory Visit
Admission: RE | Admit: 2017-05-24 | Discharge: 2017-05-24 | Disposition: A | Discharge status: Home / Self Care | Payer: PRIVATE HEALTH INSURANCE | Source: Ambulatory Visit | Attending: Pain Medicine | Admitting: Pain Medicine

## 2017-05-24 DIAGNOSIS — M21372 Foot drop, left foot: Secondary | ICD-10-CM | POA: Insufficient documentation

## 2017-05-24 DIAGNOSIS — G8929 Other chronic pain: Secondary | ICD-10-CM | POA: Diagnosis present

## 2017-05-24 DIAGNOSIS — M5442 Lumbago with sciatica, left side: Secondary | ICD-10-CM | POA: Insufficient documentation

## 2017-05-24 DIAGNOSIS — M5136 Other intervertebral disc degeneration, lumbar region: Secondary | ICD-10-CM | POA: Insufficient documentation

## 2017-05-24 DIAGNOSIS — M4807 Spinal stenosis, lumbosacral region: Secondary | ICD-10-CM | POA: Insufficient documentation

## 2017-05-24 DIAGNOSIS — M79605 Pain in left leg: Secondary | ICD-10-CM | POA: Insufficient documentation

## 2017-05-24 DIAGNOSIS — M5417 Radiculopathy, lumbosacral region: Secondary | ICD-10-CM | POA: Diagnosis not present

## 2017-05-24 DIAGNOSIS — M961 Postlaminectomy syndrome, not elsewhere classified: Secondary | ICD-10-CM | POA: Diagnosis not present

## 2017-05-29 NOTE — Progress Notes (Signed)
Patient's Name: Chase Mendez  MRN: [id number]  Referring Provider: Jodi Marble, MD  DOB: 1934-03-15  PCP: Jodi Marble, MD  DOS: 05/30/2017  Note by: Gaspar Cola, MD  Service setting: Ambulatory outpatient  Specialty: Interventional Pain Management  Location: [facility] (AMB) Pain Management Facility    Patient type: Established   Primary Reason(s) for Visit: Encounter for evaluation before starting new chronic pain management plan of care (Level of risk: moderate) CC: Back Pain (left leg)  HPI  Chase Mendez is a 83 y.o. year old, male patient, who comes today for a follow-up evaluation to review the test results and decide on a treatment plan. 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 His primarily concern today is the Back Pain (left leg)  Pain Assessment: Location: Lower Back Radiating: Radiates down left leg to foot and toes Onset: More than a month ago Duration: Chronic pain Quality: Cramping, Burning, Aching, Discomfort, Constant Severity: 7 /10 (self-reported pain score)  Note: Reported level is inconsistent with clinical observations. Clinically the patient looks like a 3/10             When using our objective Pain Scale, levels between 6 and 10/10 are said to belong in an emergency room, as it progressively worsens from a 6/10, described as severely limiting, requiring emergency care not usually available  at an outpatient pain management facility. At a 6/10 level, communication becomes difficult and requires great effort. Assistance to reach the emergency department may be required. Facial flushing and profuse sweating along with potentially dangerous increases in heart rate and blood pressure will be evident. Effect on ADL: not able to do household chores Timing: Constant Modifying factors: lay down, meds  Chase Mendez comes in today for a follow-up visit after his initial evaluation on 05/17/2017. Today we went over the results of his tests. These were explained in "Layman's terms". During today's appointment we went over my diagnostic impression, as well as the proposed treatment plan.  The patient comes into the clinics today for the first time for a chronic pain management evaluation.  This patient is known to me as he was a prior clinic patient.  His primary problem seems to be low back pain and lower extremity pain secondary to a failed back surgical syndrome.  He has a long-standing history of managing his pain with opioids.  It has been a long time since he took a "drug holiday".  Currently he is being managed by the [facility].  He was interested in going back on his methadone.  However, he is 83 years old and has began to experience cardiac problems which makes him a poor candidate to go back on a medication that can cause prolongation of the QT interval.  In considering the treatment plan options, Chase Mendez was reminded that I no longer take patients for medication management only. I asked him to let me know if he had no intention of taking advantage of the interventional therapies, so that we could make arrangements to provide  this space to someone interested. I also made it clear that undergoing interventional therapies for the purpose of getting pain medications is very inappropriate on the part of a patient, and it will not be tolerated in this practice. This type of behavior would  suggest true addiction and therefore it requires referral to an addiction specialist.   Further details on both, my assessment(s), as well as the proposed treatment plan, please see below.  Controlled Substance Pharmacotherapy Assessment REMS (Risk Evaluation and Mitigation Strategy)  Analgesic: Oxycodone IR 15 mg p.o. 4 times daily Highest recorded MME/day: 90 mg/day MME/day: 90 mg/day Pill Count: None expected due to no prior prescriptions written by our practice. Has a prescription from [facility] to be filled today (05/30/17), to last 30 days (06/29/17). No notes on file Pharmacokinetics: Liberation and absorption (onset of action): WNL Distribution (time to peak effect): WNL Metabolism and excretion (duration of action): WNL         Pharmacodynamics: Desired effects: Analgesia: Chase Mendez reports >50% benefit. Functional ability: Patient reports that medication allows him to accomplish basic ADLs Clinically meaningful improvement in function (CMIF): Sustained CMIF goals met Perceived effectiveness: Described as relatively effective, allowing for increase in activities of daily living (ADL) Undesirable effects: Side-effects or Adverse reactions: None reported Monitoring: [place] PMP: Online review of the past 50-monthperiod previously conducted. Not applicable at this point since we have not taken over the patient's medication management yet. Medication Assessment Form: Not applicable. Treatment compliance: Not applicable Risk Assessment Profile: Aberrant behavior: claims that "nothing else works", continued use despite claims of ineffective analgesia and significant opioid tolerance Comorbid factors increasing risk of overdose: Benzodiazepine use Medical Psychology Evaluation: Please see scanned results in medical record.  ORT Scoring interpretation table:  Score <3 = Low Risk for SUD  Score between 4-7 = Moderate Risk for SUD  Score >8 = High Risk for Opioid Abuse   Risk  Mitigation Strategies:  Patient opioid safety counseling: Not applicable. Patient-Prescriber Agreement (PPA): No agreement signed.  Controlled substance notification to other providers: Not applicable  Pharmacologic Plan: No change in therapy, at this time.             Laboratory Chemistry  Inflammation Markers (CRP: Acute Phase) (ESR: Chronic Phase) Lab Results  Component Value Date   LATICACIDVEN 1.1 12/08/2014                         Renal Function Markers Lab Results  Component Value Date   BUN 16 01/25/2017   CREATININE 0.98 01/25/2017   GFRAA >60 01/25/2017   GFRNONAA >60 01/25/2017                              Hepatic Function Markers Lab Results  Component Value Date   AST 28 08/21/2016   ALT 18 08/21/2016   ALBUMIN 4.1 08/21/2016   ALKPHOS 112 08/21/2016   LIPASE 37 08/21/2016                        Electrolytes Lab Results  Component Value Date   NA 137 01/25/2017   K 3.2 (L) 01/25/2017   CL 99 (L) 01/25/2017   CALCIUM 9.2 01/25/2017                        Neuropathy Markers Lab Results  Component Value Date  HGBA1C 6.3 (H) 12/08/2014                        Coagulation Parameters Lab Results  Component Value Date   PLT 169 01/25/2017                        Cardiovascular Markers Lab Results  Component Value Date   TROPONINI <0.03 01/25/2017   HGB 16.6 01/25/2017   HCT 47.0 01/25/2017                         Note: Lab results reviewed.  Recent Diagnostic Imaging Review  Lumbosacral Imaging: Lumbar MR wo contrast:  Results for orders placed during the hospital encounter of 05/24/17  MR LUMBAR SPINE WO CONTRAST   Narrative CLINICAL DATA:  Chronic left leg pain. Previous lumbar surgery. Chronic bilateral low back pain.  EXAM: MRI LUMBAR SPINE WITHOUT CONTRAST  TECHNIQUE: Multiplanar, multisequence MR imaging of the lumbar spine was performed. No intravenous contrast was administered.  COMPARISON:  CT  04/12/2009  FINDINGS: Segmentation:  5 lumbar type vertebral bodies.  Alignment: Straightening of the normal lumbar lordosis. Mild curvature convex towards the left.  Vertebrae: Benign appearing hemangioma within the left posterior inferior corner of the L1 vertebral body. Superior endplate Schmorl's node at L5.  Conus medullaris and cauda equina: Conus extends to the L1 level. Conus and cauda equina appear normal.  Paraspinal and other soft tissues: Negative  Disc levels:  T11-12 and T12-L1: Normal appearance of the discs. Minimal facet hypertrophy. No stenosis.  L1-2: Endplate osteophytes and bulging of the disc. Bilateral facet degeneration and hypertrophy. No compressive stenosis.  L2-3: Disc bulge.  Mild facet hypertrophy.  No stenosis.  L3-4: Disc bulge. Mild facet hypertrophy. Mild narrowing of the lateral recesses but without visible neural compression.  L4-5: Bulging of the disc more prominent towards the right. Facet and ligamentous hypertrophy. Stenosis of the right lateral recess that could compress the right L5 nerve. Right L4 nerve appears to exit without compression.  L5-S1: Previous left hemilaminectomy. Endplate osteophytes and bulging of the disc towards the right. Mild facet degeneration. Mild narrowing of the subarticular lateral recess on the right. Foraminal narrowing on the left that could affect the exiting L5 nerve.  IMPRESSION: Multilevel degenerative disc disease and degenerative facet disease as outlined above. No central canal stenosis. Right lateral recess stenosis at L4-5 that could affect the right L5 nerve. Left foraminal stenosis at L5-S1 that could affect the left L5 nerve. Previous left hemilaminectomy at L5-S1.   Electronically Signed   By: Nelson Chimes M.D.   On: 05/24/2017 12:59    Complexity Note: Imaging results reviewed. Results shared with Mr. Pottinger, using Layman's terms.                         Meds   Current  Outpatient Medications:  .  allopurinol (ZYLOPRIM) 100 MG tablet, Take 100 mg by mouth daily., Disp: , Rfl: 0 .  aspirin EC 81 MG tablet, Take 81 mg by mouth daily., Disp: , Rfl:  .  atorvastatin (LIPITOR) 10 MG tablet, Take 10 mg by mouth every evening., Disp: , Rfl: 0 .  benzonatate (TESSALON) 100 MG capsule, take 1 capsule by mouth three times a day, Disp: , Rfl: 0 .  citalopram (CELEXA) 20 MG tablet, Take 20 mg by mouth daily., Disp: , Rfl:  .  Cyanocobalamin 1000 MCG/ML LIQD, Take 1,000 mg by mouth daily., Disp: , Rfl:  .  diazepam (VALIUM) 5 MG tablet, Take 1 tablet (5 mg total) by mouth as needed for up to 2 doses for anxiety (Take one tab 45 minutes before MRI. Take second tablet just prior to MRI scan). Do not take medication within 4 hours of taking opioid pain medications. Must have a driver. Do not drive or operate machinery x 24 hours after taking this medication., Disp: 2 tablet, Rfl: 0 .  docusate sodium (COLACE) 50 MG capsule, Take 50 mg by mouth 2 (two) times daily., Disp: , Rfl:  .  fluticasone (FLONASE) 50 MCG/ACT nasal spray, fluticasone propionate 50 mcg/act susp, Disp: , Rfl:  .  gabapentin (NEURONTIN) 600 MG tablet, Take 600 mg by mouth 4 (four) times daily., Disp: , Rfl: 0 .  isosorbide dinitrate (ISORDIL) 30 MG tablet, Take 30 mg by mouth 3 (three) times daily., Disp: , Rfl:  .  levothyroxine (SYNTHROID, LEVOTHROID) 150 MCG tablet, Take 150 mcg by mouth every morning., Disp: , Rfl: 0 .  linaclotide (LINZESS) 290 MCG CAPS capsule, Take 290 mcg by mouth daily before breakfast., Disp: , Rfl:  .  losartan (COZAAR) 50 MG tablet, Take 50 mg by mouth daily., Disp: , Rfl:  .  metFORMIN (GLUCOPHAGE) 500 MG tablet, Take 500 mg by mouth every evening., Disp: , Rfl: 0 .  mirabegron ER (MYRBETRIQ) 25 MG TB24 tablet, Take 25 mg by mouth daily., Disp: , Rfl:  .  omeprazole (PRILOSEC) 20 MG capsule, Take 20 mg by mouth daily., Disp: , Rfl:  .  ondansetron (ZOFRAN ODT) 4 MG disintegrating  tablet, Take 1 tablet (4 mg total) by mouth every 8 (eight) hours as needed for nausea or vomiting., Disp: 20 tablet, Rfl: 0 .  oxyCODONE (ROXICODONE) 15 MG immediate release tablet, Take 15 mg by mouth every 6 (six) hours., Disp: , Rfl:  .  sucralfate (CARAFATE) 1 g tablet, Take 1 g by mouth 4 (four) times daily -  with meals and at bedtime., Disp: , Rfl:  .  testosterone (ANDROGEL) 50 MG/5GM (1%) GEL, Place onto the skin daily., Disp: , Rfl:  .  vitamin E 400 UNIT capsule, Take 400 Units by mouth daily., Disp: , Rfl:  .  metoCLOPramide (REGLAN) 10 MG tablet, Take 1 tablet (10 mg total) by mouth every 8 (eight) hours as needed for up to 10 days for nausea or vomiting., Disp: 15 tablet, Rfl: 1  ROS  Constitutional: Denies any fever or chills Gastrointestinal: No reported hemesis, hematochezia, vomiting, or acute GI distress Musculoskeletal: Denies any acute onset joint swelling, redness, loss of ROM, or weakness Neurological: No reported episodes of acute onset apraxia, aphasia, dysarthria, agnosia, amnesia, paralysis, loss of coordination, or loss of consciousness  Allergies  Mr. Kluth is allergic to ibuprofen; sulfa antibiotics; sulfasalazine; and lidocaine.  [place]  Drug: Mr. Wachsmuth  reports that he does not use drugs. Alcohol:  reports that he does not drink alcohol. Tobacco:  reports that he has been smoking cigars.  He has never used smokeless tobacco. Medical:  has a past medical history of Acute encephalopathy (12/08/2014), Anxiety, ARF (acute renal failure) ([place]) (12/08/2014), Back pain, Benign neoplasm of large bowel, Capsulitis, Chronic back pain, Depression, Diabetes mellitus without complication ([place]), Dysphagia, Exostosis, GERD (gastroesophageal reflux disease), Gout, Hypothyroidism, Insomnia, Low testosterone, Microscopic hematuria, Pneumonia (12/07/2014), Pressure ulcer (12/09/2014), Sepsis ([place]) (12/08/2014), and Thyroid disease. Surgical: Mr. Dauphine  has a past surgical history  that  includes Cholecystectomy; Appendectomy; Back surgery; Eye surgery; and Esophagogastroduodenoscopy (egd) with propofol (N/A, 04/24/2017). Family: family history includes Diabetes in his father; Heart disease in his mother.  Constitutional Exam  General appearance: Well nourished, well developed, and well hydrated. In no apparent acute distress Vitals:   05/30/17 1026  BP: (!) 146/79  Pulse: 74  Temp: 97.8 F (36.6 C)  SpO2: 96%  Weight: 189 lb (85.7 kg)  Height: 6' (1.829 m)   BMI Assessment: Estimated body mass index is 25.63 kg/m as calculated from the following:   Height as of this encounter: 6' (1.829 m).   Weight as of this encounter: 189 lb (85.7 kg).  BMI interpretation table: BMI level Category Range association with higher incidence of chronic pain  <18 kg/m2 Underweight   18.5-24.9 kg/m2 Ideal body weight   25-29.9 kg/m2 Overweight Increased incidence by 20%  30-34.9 kg/m2 Obese (Class I) Increased incidence by 68%  35-39.9 kg/m2 Severe obesity (Class II) Increased incidence by 136%  >40 kg/m2 Extreme obesity (Class III) Increased incidence by 254%   BMI Readings from Last 4 Encounters:  05/30/17 25.63 kg/m  05/16/17 24.68 kg/m  04/24/17 23.60 kg/m  01/25/17 24.55 kg/m   Wt Readings from Last 4 Encounters:  05/30/17 189 lb (85.7 kg)  05/16/17 182 lb (82.6 kg)  04/24/17 174 lb (78.9 kg)  01/25/17 181 lb (82.1 kg)  Psych/Mental status: Alert, oriented x 3 (person, place, & time)       Eyes: PERLA Respiratory: No evidence of acute respiratory distress  Cervical Spine Area Exam  Skin & Axial Inspection: No masses, redness, edema, swelling, or associated skin lesions Alignment: Symmetrical Functional ROM: Unrestricted ROM      Stability: No instability detected Muscle Tone/Strength: Functionally intact. No obvious neuro-muscular anomalies detected. Sensory (Neurological): Unimpaired Palpation: No palpable anomalies              Upper Extremity (UE) Exam     Side: Right upper extremity  Side: Left upper extremity  Skin & Extremity Inspection: Skin color, temperature, and hair growth are WNL. No peripheral edema or cyanosis. No masses, redness, swelling, asymmetry, or associated skin lesions. No contractures.  Skin & Extremity Inspection: Skin color, temperature, and hair growth are WNL. No peripheral edema or cyanosis. No masses, redness, swelling, asymmetry, or associated skin lesions. No contractures.  Functional ROM: Unrestricted ROM          Functional ROM: Unrestricted ROM          Muscle Tone/Strength: Functionally intact. No obvious neuro-muscular anomalies detected.  Muscle Tone/Strength: Functionally intact. No obvious neuro-muscular anomalies detected.  Sensory (Neurological): Unimpaired          Sensory (Neurological): Unimpaired          Palpation: No palpable anomalies              Palpation: No palpable anomalies              Specialized Test(s): Deferred         Specialized Test(s): Deferred          Thoracic Spine Area Exam  Skin & Axial Inspection: No masses, redness, or swelling Alignment: Symmetrical Functional ROM: Unrestricted ROM Stability: No instability detected Muscle Tone/Strength: Functionally intact. No obvious neuro-muscular anomalies detected. Sensory (Neurological): Unimpaired Muscle strength & Tone: No palpable anomalies  Lumbar Spine Area Exam  Skin & Axial Inspection: No masses, redness, or swelling Alignment: Symmetrical Functional ROM: Decreased ROM  Stability: No instability detected Muscle Tone/Strength: Functionally intact. No obvious neuro-muscular anomalies detected. Sensory (Neurological): Movement-associated pain Palpation: Complains of area being tender to palpation       Provocative Tests: Lumbar Hyperextension and rotation test: Positive bilaterally for facet joint pain. Lumbar Lateral bending test: evaluation deferred today       Patrick's Maneuver: evaluation deferred today                     Gait & Posture Assessment  Ambulation: Patient ambulates using a cane Gait: Very limited, using assistive device to ambulate Posture: Difficulty standing up straight, due to pain   Lower Extremity Exam    Side: Right lower extremity  Side: Left lower extremity  Skin & Extremity Inspection: Skin color, temperature, and hair growth are WNL. No peripheral edema or cyanosis. No masses, redness, swelling, asymmetry, or associated skin lesions. No contractures.  Skin & Extremity Inspection: Skin color, temperature, and hair growth are WNL. No peripheral edema or cyanosis. No masses, redness, swelling, asymmetry, or associated skin lesions. No contractures.  Functional ROM: Unrestricted ROM          Functional ROM: Decreased ROM for hip and knee joints  Muscle Tone/Strength: Functionally intact. No obvious neuro-muscular anomalies detected.  Muscle Tone/Strength: L5 myotomal (EHL) weakness (Toe Dorsiflexion)  Sensory (Neurological): Unimpaired  Sensory (Neurological): Dermatomal pain pattern  Palpation: No palpable anomalies  Palpation: No palpable anomalies   Assessment & Plan  Primary Diagnosis & Pertinent Problem List: The primary encounter diagnosis was Lumbosacral radiculopathy at L5. 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  Visit Diagnosis: 1. Lumbosacral radiculopathy at L5   2. Chronic lower extremity pain (Left)   3. Lower limb pain, inferior, left (L5) (Foot Drop)   4. 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 Foot drop (Left)   6. Failed back surgical syndrome   7. Opioid Drug tolerance   8. History of acute  renal failure (12/08/2014)   9. History of encephalopathy (12/08/2014)   10. History of sepsis (12/08/2014)    Problems updated and reviewed during this visit: Problem  Chronic low back pain (Bilateral) (ML) (L>R) with sciatica (Left)  Chronic lower extremity pain (Left)  Foot drop (Left)  Opioid Drug tolerance  History of acute renal failure (12/08/2014)  History of encephalopathy (12/08/2014)  History of sepsis (12/08/2014)  Sepsis ([facility]) (Resolved)  Acute Encephalopathy (Resolved)  Pressure Ulcer (Resolved)  Arf (Acute Renal Failure) ([facility]) (Resolved)  Pneumonia (Resolved)    Plan of Care  Pharmacotherapy (Medications Ordered): No orders of the defined types were placed in this encounter.   Procedure Orders     Lumbar Transforaminal Epidural Lab Orders  No laboratory test(s) ordered today   Imaging Orders  No imaging studies ordered today   Referral Orders  No referral(s) requested today    Pharmacological management options:  Opioid Analgesics: Patient still has some left, we'll take over on next visit Membrane stabilizer: We have discussed the possibility of optimizing this mode of therapy, if tolerated Muscle relaxant: We have discussed the possibility of a trial NSAID: We have discussed the possibility of a trial Other analgesic(s): To be determined at  a later time   Interventional management options: Planned, scheduled, and/or pending:    Diagnostic left-sided L4 and L5 TFESI #1 under fluoroscopic guidance and IV sedation   Considering:   Diagnostic caudal epidural steroid injection + epidurogram  Possible Racz procedure  Diagnostic bilateral lumbar facet block  Possible bilateral lumbar facet RFA  Possible candidate for intrathecal pump trial and implant  Possible candidate for spinal cord stimulator trial and implant    PRN Procedures:   None at this time   Provider-requested follow-up: Return for Procedure (w/ sedation): (L) L4 & L5 TFESI #1.  Future  Appointments  Date Time Provider [place]  06/19/2017  9:30 AM Milinda Pointer, MD [facility] None    Primary Care Physician: Jodi Marble, MD Location: [facility] Outpatient Pain Management Facility Note by: Gaspar Cola, MD Date: 05/30/2017; Time: 12:07 PM

## 2017-05-30 ENCOUNTER — Encounter: Payer: Self-pay | Admitting: Pain Medicine

## 2017-05-30 ENCOUNTER — Ambulatory Visit: Payer: Medicare Other | Attending: Pain Medicine | Admitting: Pain Medicine

## 2017-05-30 ENCOUNTER — Other Ambulatory Visit: Payer: Self-pay

## 2017-05-30 VITALS — BP 146/79 | HR 74 | Temp 97.8°F | Ht 72.0 in | Wt 189.0 lb

## 2017-05-30 DIAGNOSIS — K219 Gastro-esophageal reflux disease without esophagitis: Secondary | ICD-10-CM | POA: Insufficient documentation

## 2017-05-30 DIAGNOSIS — Z7984 Long term (current) use of oral hypoglycemic drugs: Secondary | ICD-10-CM | POA: Diagnosis not present

## 2017-05-30 DIAGNOSIS — G9340 Encephalopathy, unspecified: Secondary | ICD-10-CM | POA: Diagnosis not present

## 2017-05-30 DIAGNOSIS — M5442 Lumbago with sciatica, left side: Secondary | ICD-10-CM | POA: Diagnosis not present

## 2017-05-30 DIAGNOSIS — M79604 Pain in right leg: Secondary | ICD-10-CM | POA: Insufficient documentation

## 2017-05-30 DIAGNOSIS — N179 Acute kidney failure, unspecified: Secondary | ICD-10-CM | POA: Insufficient documentation

## 2017-05-30 DIAGNOSIS — Z8669 Personal history of other diseases of the nervous system and sense organs: Secondary | ICD-10-CM

## 2017-05-30 DIAGNOSIS — R1310 Dysphagia, unspecified: Secondary | ICD-10-CM | POA: Diagnosis not present

## 2017-05-30 DIAGNOSIS — M21372 Foot drop, left foot: Secondary | ICD-10-CM | POA: Diagnosis not present

## 2017-05-30 DIAGNOSIS — T50905S Adverse effect of unspecified drugs, medicaments and biological substances, sequela: Secondary | ICD-10-CM

## 2017-05-30 DIAGNOSIS — R3129 Other microscopic hematuria: Secondary | ICD-10-CM | POA: Insufficient documentation

## 2017-05-30 DIAGNOSIS — M79605 Pain in left leg: Secondary | ICD-10-CM | POA: Diagnosis present

## 2017-05-30 DIAGNOSIS — F329 Major depressive disorder, single episode, unspecified: Secondary | ICD-10-CM | POA: Diagnosis not present

## 2017-05-30 DIAGNOSIS — G8929 Other chronic pain: Secondary | ICD-10-CM | POA: Diagnosis not present

## 2017-05-30 DIAGNOSIS — M961 Postlaminectomy syndrome, not elsewhere classified: Secondary | ICD-10-CM

## 2017-05-30 DIAGNOSIS — G629 Polyneuropathy, unspecified: Secondary | ICD-10-CM | POA: Insufficient documentation

## 2017-05-30 DIAGNOSIS — E119 Type 2 diabetes mellitus without complications: Secondary | ICD-10-CM | POA: Diagnosis not present

## 2017-05-30 DIAGNOSIS — Z7982 Long term (current) use of aspirin: Secondary | ICD-10-CM | POA: Insufficient documentation

## 2017-05-30 DIAGNOSIS — Z8619 Personal history of other infectious and parasitic diseases: Secondary | ICD-10-CM | POA: Diagnosis not present

## 2017-05-30 DIAGNOSIS — E039 Hypothyroidism, unspecified: Secondary | ICD-10-CM | POA: Diagnosis not present

## 2017-05-30 DIAGNOSIS — M48061 Spinal stenosis, lumbar region without neurogenic claudication: Secondary | ICD-10-CM | POA: Insufficient documentation

## 2017-05-30 DIAGNOSIS — M5417 Radiculopathy, lumbosacral region: Secondary | ICD-10-CM

## 2017-05-30 DIAGNOSIS — I8393 Asymptomatic varicose veins of bilateral lower extremities: Secondary | ICD-10-CM | POA: Diagnosis not present

## 2017-05-30 DIAGNOSIS — M109 Gout, unspecified: Secondary | ICD-10-CM | POA: Diagnosis not present

## 2017-05-30 DIAGNOSIS — Z79899 Other long term (current) drug therapy: Secondary | ICD-10-CM | POA: Diagnosis not present

## 2017-05-30 DIAGNOSIS — M5136 Other intervertebral disc degeneration, lumbar region: Secondary | ICD-10-CM | POA: Insufficient documentation

## 2017-05-30 DIAGNOSIS — Z87448 Personal history of other diseases of urinary system: Secondary | ICD-10-CM | POA: Diagnosis not present

## 2017-05-30 DIAGNOSIS — M549 Dorsalgia, unspecified: Secondary | ICD-10-CM | POA: Insufficient documentation

## 2017-05-30 DIAGNOSIS — F419 Anxiety disorder, unspecified: Secondary | ICD-10-CM | POA: Insufficient documentation

## 2017-05-30 NOTE — Patient Instructions (Addendum)
____________________________________________________________________________________________  Preparing for Procedure with Sedation  Instructions: . Oral Intake: Do not eat or drink anything for at least 8 hours prior to your procedure. . Transportation: Public transportation is not allowed. Bring an adult driver. The driver must be physically present in our waiting room before any procedure can be started. [name] Physical Assistance: Bring an adult physically capable of assisting you, in the event you need help. This adult should keep you company at home for at least 6 hours after the procedure. . Blood Pressure Medicine: Take your blood pressure medicine with a sip of water the morning of the procedure. . Blood thinners:  . Diabetics on insulin: Notify the staff so that you can be scheduled 1st case in the morning. If your diabetes requires high dose insulin, take only  of your normal insulin dose the morning of the procedure and notify the staff that you have done so. . Preventing infections: Shower with an antibacterial soap the morning of your procedure. . Build-up your immune system: Take 1000 mg of Vitamin C with every meal (3 times a day) the day prior to your procedure. [name] Antibiotics: Inform the staff if you have a condition or reason that requires you to take antibiotics before dental procedures. . Pregnancy: If you are pregnant, call and cancel the procedure. . Sickness: If you have a cold, fever, or any active infections, call and cancel the procedure. . Arrival: You must be in the facility at least 30 minutes prior to your scheduled procedure. . Children: Do not bring children with you. . Dress appropriately: Bring dark clothing that you would not mind if they get stained. . Valuables: Do not bring any jewelry or valuables.  Procedure appointments are reserved for interventional treatments only. [name] No Prescription Refills. . No medication changes will be discussed during procedure  appointments. . No disability issues will be discussed.  Remember:  Regular Business hours are:  Monday to Thursday 8:00 AM to 4:00 PM  Provider's Schedule: Milinda Pointer, MD:  Procedure days: Tuesday and Thursday 7:30 AM to 4:00 PM  Gillis Santa, MD:  Procedure days: Monday and Wednesday 7:30 AM to 4:00 PM ____________________________________________________________________________________________   ____________________________________________________________________________________________  Preparing for Procedure with Sedation  Instructions: . Oral Intake: Do not eat or drink anything for at least 8 hours prior to your procedure. . Transportation: Public transportation is not allowed. Bring an adult driver. The driver must be physically present in our waiting room before any procedure can be started. [name] Physical Assistance: Bring an adult physically capable of assisting you, in the event you need help. This adult should keep you company at home for at least 6 hours after the procedure. . Blood Pressure Medicine: Take your blood pressure medicine with a sip of water the morning of the procedure. . Blood thinners:  . Diabetics on insulin: Notify the staff so that you can be scheduled 1st case in the morning. If your diabetes requires high dose insulin, take only  of your normal insulin dose the morning of the procedure and notify the staff that you have done so. . Preventing infections: Shower with an antibacterial soap the morning of your procedure. . Build-up your immune system: Take 1000 mg of Vitamin C with every meal (3 times a day) the day prior to your procedure. [name] Antibiotics: Inform the staff if you have a condition or reason that requires you to take antibiotics before dental procedures. . Pregnancy: If you are pregnant, call and cancel the procedure. [name]  Sickness: If you have a cold, fever, or any active infections, call and cancel the procedure. . Arrival: You must be  in the facility at least 30 minutes prior to your scheduled procedure. . Children: Do not bring children with you. . Dress appropriately: Bring dark clothing that you would not mind if they get stained. . Valuables: Do not bring any jewelry or valuables.  Procedure appointments are reserved for interventional treatments only. [name] No Prescription Refills. . No medication changes will be discussed during procedure appointments. . No disability issues will be discussed.  Remember:  Regular Business hours are:  Monday to Thursday 8:00 AM to 4:00 PM  Provider's Schedule: Milinda Pointer, MD:  Procedure days: Tuesday and Thursday 7:30 AM to 4:00 PM  Gillis Santa, MD:  Procedure days: Monday and Wednesday 7:30 AM to 4:00 PM ____________________________________________________________________________________________   ____________________________________________________________________________________________  Medication Rules  Applies to: All patients receiving prescriptions (written or electronic).  Pharmacy of record: Pharmacy where electronic prescriptions will be sent. If written prescriptions are taken to a different pharmacy, please inform the nursing staff. The pharmacy listed in the electronic medical record should be the one where you would like electronic prescriptions to be sent.  Prescription refills: Only during scheduled appointments. Applies to both, written and electronic prescriptions.  NOTE: The following applies primarily to controlled substances (Opioid* Pain Medications).   Patient's responsibilities: 1. Pain Pills: Bring all pain pills to every appointment (except for procedure appointments). 2. Pill Bottles: Bring pills in original pharmacy bottle. Always bring newest bottle. Bring bottle, even if empty. 3. Medication refills: You are responsible for knowing and keeping track of what medications you need refilled. The day before your appointment, write a list of  all prescriptions that need to be refilled. Bring that list to your appointment and give it to the admitting nurse. Prescriptions will be written only during appointments. If you forget a medication, it will not be "Called in", "Faxed", or "electronically sent". You will need to get another appointment to get these prescribed. 4. Prescription Accuracy: You are responsible for carefully inspecting your prescriptions before leaving our office. Have the discharge nurse carefully go over each prescription with you, before taking them home. Make sure that your name is accurately spelled, that your address is correct. Check the name and dose of your medication to make sure it is accurate. Check the number of pills, and the written instructions to make sure they are clear and accurate. Make sure that you are given enough medication to last until your next medication refill appointment. 5. Taking Medication: Take medication as prescribed. Never take more pills than instructed. Never take medication more frequently than prescribed. Taking less pills or less frequently is permitted and encouraged, when it comes to controlled substances (written prescriptions).  6. Inform other Doctors: Always inform, all of your healthcare providers, of all the medications you take. 7. Pain Medication from other Providers: You are not allowed to accept any additional pain medication from any other Doctor or Healthcare provider. There are two exceptions to this rule. (see below) In the event that you require additional pain medication, you are responsible for notifying us, as stated below. 8. Medication Agreement: You are responsible for carefully reading and following our Medication Agreement. This must be signed before receiving any prescriptions from our practice. Safely store a copy of your signed Agreement. Violations to the Agreement will result in no further prescriptions. (Additional copies of our Medication Agreement are  available upon request.) 9.  Laws, Rules, & Regulations: All patients are expected to follow all Federal and [employer], [employer], Rules, [employer]. Ignorance of the Laws does not constitute a valid excuse. The use of any illegal substances is prohibited. 10. Adopted CDC guidelines & recommendations: Target dosing levels will be at or below 60 MME/day. Use of benzodiazepines** is not recommended.  Exceptions: There are only two exceptions to the rule of not receiving pain medications from other Healthcare Providers. 1. Exception #1 (Emergencies): In the event of an emergency (i.e.: accident requiring emergency care), you are allowed to receive additional pain medication. However, you are responsible for: As soon as you are able, call our office [phone], at any time of the day or night, and leave a message stating your name, the date and nature of the emergency, and the name and dose of the medication prescribed. In the event that your call is answered by a member of our staff, make sure to document and save the date, time, and the name of the person that took your information.  2. Exception #2 (Planned Surgery): In the event that you are scheduled by another doctor or dentist to have any type of surgery or procedure, you are allowed (for a period no longer than 30 days), to receive additional pain medication, for the acute post-op pain. However, in this case, you are responsible for picking up a copy of our "Post-op Pain Management for Surgeons" handout, and giving it to your surgeon or dentist. This document is available at our office, and does not require an appointment to obtain it. Simply go to our office during business hours (Monday-Thursday from 8:00 AM to 4:00 PM) (Friday 8:00 AM to 12:00 Noon) or if you have a scheduled appointment with [place], prior to your surgery, and ask for it by name. In addition, you will need to provide [place] with your name, name of your surgeon, type of surgery, and date  of procedure or surgery.  *Opioid medications include: morphine, codeine, oxycodone, oxymorphone, hydrocodone, hydromorphone, meperidine, tramadol, tapentadol, buprenorphine, fentanyl, methadone. **Benzodiazepine medications include: diazepam (Valium), alprazolam (Xanax), clonazepam (Klonopine), lorazepam (Ativan), clorazepate (Tranxene), chlordiazepoxide (Librium), estazolam (Prosom), oxazepam (Serax), temazepam (Restoril), triazolam (Halcion) (Last updated: 04/05/2017) ____________________________________________________________________________________________   ____________________________________________________________________________________________  Medication Recommendations and Reminders  Applies to: All patients receiving prescriptions (written and/or electronic).  Medication Rules & Regulations: These rules and regulations exist for your safety and that of others. They are not flexible and neither are we. Dismissing or ignoring them will be considered "non-compliance" with medication therapy, resulting in complete and irreversible termination of such therapy. (See document titled "Medication Rules" for more details.) In all conscience, because of safety reasons, we cannot continue providing a therapy where the patient does not follow instructions.  Pharmacy of record:   Definition: This is the pharmacy where your electronic prescriptions will be sent.   We do not endorse any particular pharmacy.  You are not restricted in your choice of pharmacy.  The pharmacy listed in the electronic medical record should be the one where you want electronic prescriptions to be sent.  If you choose to change pharmacy, simply notify our nursing staff of your choice of new pharmacy.  Recommendations:  Keep all of your pain medications in a safe place, under lock and key, even if you live alone.   After you fill your prescription, take 1 week's worth of pills and put them away in a safe  place. You should keep a separate, properly labeled bottle for  this purpose. The remainder should be kept in the original bottle. Use this as your primary supply, until it runs out. Once it's gone, then you know that you have 1 week's worth of medicine, and it is time to come in for a prescription refill. If you do this correctly, it is unlikely that you will ever run out of medicine.  To make sure that the above recommendation works, it is very important that you make sure your medication refill appointments are scheduled at least 1 week before you run out of medicine. To do this in an effective manner, make sure that you do not leave the office without scheduling your next medication management appointment. Always ask the nursing staff to show you in your prescription , when your medication will be running out. Then arrange for the receptionist to get you a return appointment, at least 7 days before you run out of medicine. Do not wait until you have 1 or 2 pills left, to come in. This is very poor planning and does not take into consideration that we may need to cancel appointments due to bad weather, sickness, or emergencies affecting our staff.  Prescription refills and/or changes in medication(s):   Prescription refills, and/or changes in dose or medication, will be conducted only during scheduled medication management appointments. (Applies to both, written and electronic prescriptions.)  No refills on procedure days. No medication will be changed or started on procedure days. No changes, adjustments, and/or refills will be conducted on a procedure day. Doing so will interfere with the diagnostic portion of the procedure.  No phone refills. No medications will be "called into the pharmacy".  No Fax refills.  No weekend refills.  No Holliday refills.  No after hours refills.  Remember:  Business hours are:  Monday to Thursday 8:00 AM to 4:00 PM Provider's Schedule: Dionisio David, NP -  Appointments are:  Medication management: Monday to Thursday 8:00 AM to 4:00 PM Milinda Pointer, MD - Appointments are:  Medication management: Monday and Wednesday 8:00 AM to 4:00 PM Procedure day: Tuesday and Thursday 7:30 AM to 4:00 PM Gillis Santa, MD - Appointments are:  Medication management: Tuesday and Thursday 8:00 AM to 4:00 PM Procedure day: Monday and Wednesday 7:30 AM to 4:00 PM (Last update: 04/05/2017) ____________________________________________________________________________________________   ____________________________________________________________________________________________  CANNABIDIOL (AKA: CBD Oil or Pills)  Applies to: All patients receiving prescriptions of controlled substances (written and/or electronic).  General Information: Cannabidiol (CBD) was discovered in 63. It is one of some 113 identified cannabinoids in cannabis (Marijuana) plants, accounting for up to 40% of the plant's extract. As of 2018, preliminary clinical research on cannabidiol included studies of anxiety, cognition, movement disorders, and pain.  Cannabidiol is consummed in multiple ways, including inhalation of cannabis smoke or vapor, as an aerosol spray into the cheek, and by mouth. It may be supplied as CBD oil containing CBD as the active ingredient (no added tetrahydrocannabinol (THC) or terpenes), a full-plant CBD-dominant hemp extract oil, capsules, dried cannabis, or as a liquid solution. CBD is thought not have the same psychoactivity as THC, and may affect the actions of THC. Studies suggest that CBD may interact with different biological targets, including cannabinoid receptors and other neurotransmitter receptors. As of 2018 the mechanism of action for its biological effects has not been determined.  In the [place], cannabidiol has a limited approval by the Food and Drug Administration (FDA) for treatment of only two types of epilepsy disorders. The side effects of  long-term use of the drug include somnolence, decreased appetite, diarrhea, fatigue, malaise, weakness, sleeping problems, and others.  CBD remains a Schedule I drug prohibited for any use.  Legality: Some manufacturers ship CBD products nationally, an illegal action which the FDA has not enforced in 2018, with CBD remaining the subject of an FDA investigational new drug evaluation, and is not considered legal as a dietary supplement or food ingredient as of December 2018. Federal illegality has made it difficult historically to conduct research on CBD. CBD is openly sold in head shops and health food stores in some states where such sales have not been explicitly legalized.  Warning: Because it is not FDA approved for general use or treatment of pain, it is not required to undergo the same manufacturing controls as prescription drugs.  This means that the available cannabidiol (CBD) may be contaminated with THC.  If this is the case, it will trigger a positive urine drug screen (UDS) test for cannabinoids (Marijuana).  Because a positive UDS for illicit substances is a violation of our medication agreement, your opioid analgesics (pain medicine) may be permanently discontinued. (Last update: 04/26/2017) ____________________________________________________________________________________________

## 2017-05-31 ENCOUNTER — Telehealth: Payer: Self-pay | Admitting: Pain Medicine

## 2017-05-31 NOTE — Telephone Encounter (Signed)
Spoke with patient's wife, instructed to continue to get Oxycodone from physician who is currently prescribing.

## 2017-05-31 NOTE — Telephone Encounter (Signed)
Mr. Rollo wants to clarify with DR. Naveira. Does he need to keep his next appt with [facility] for med refills? Dr. Dossie Arbour has not taken over meds at this point and patient has appt on May 25 for med refills. Please check with Dr. Dossie Arbour and call patient so he will know if he needs to cancel appt.

## 2017-06-10 ENCOUNTER — Encounter: Payer: Self-pay | Admitting: Pain Medicine

## 2017-06-10 DIAGNOSIS — Z87448 Personal history of other diseases of urinary system: Secondary | ICD-10-CM | POA: Insufficient documentation

## 2017-06-10 DIAGNOSIS — Z8619 Personal history of other infectious and parasitic diseases: Secondary | ICD-10-CM | POA: Insufficient documentation

## 2017-06-10 DIAGNOSIS — Z8669 Personal history of other diseases of the nervous system and sense organs: Secondary | ICD-10-CM | POA: Insufficient documentation

## 2017-06-10 DIAGNOSIS — T50905S Adverse effect of unspecified drugs, medicaments and biological substances, sequela: Secondary | ICD-10-CM | POA: Insufficient documentation

## 2017-06-19 ENCOUNTER — Ambulatory Visit
Admission: RE | Admit: 2017-06-19 | Discharge: 2017-06-19 | Disposition: A | Discharge status: Home / Self Care | Payer: Worker's Compensation | Source: Ambulatory Visit | Attending: Pain Medicine | Admitting: Pain Medicine

## 2017-06-19 ENCOUNTER — Ambulatory Visit (HOSPITAL_BASED_OUTPATIENT_CLINIC_OR_DEPARTMENT_OTHER): Payer: Worker's Compensation | Admitting: Pain Medicine

## 2017-06-19 ENCOUNTER — Encounter: Payer: Self-pay | Admitting: Pain Medicine

## 2017-06-19 ENCOUNTER — Other Ambulatory Visit: Payer: Self-pay

## 2017-06-19 VITALS — BP 157/70 | HR 73 | Temp 98.4°F | Resp 16 | Ht 72.0 in | Wt 185.0 lb

## 2017-06-19 DIAGNOSIS — G8929 Other chronic pain: Secondary | ICD-10-CM | POA: Insufficient documentation

## 2017-06-19 DIAGNOSIS — M5442 Lumbago with sciatica, left side: Secondary | ICD-10-CM | POA: Insufficient documentation

## 2017-06-19 DIAGNOSIS — M5136 Other intervertebral disc degeneration, lumbar region: Secondary | ICD-10-CM | POA: Diagnosis not present

## 2017-06-19 DIAGNOSIS — M961 Postlaminectomy syndrome, not elsewhere classified: Secondary | ICD-10-CM | POA: Insufficient documentation

## 2017-06-19 DIAGNOSIS — M5417 Radiculopathy, lumbosacral region: Secondary | ICD-10-CM | POA: Diagnosis not present

## 2017-06-19 DIAGNOSIS — M79605 Pain in left leg: Secondary | ICD-10-CM

## 2017-06-19 MED ORDER — ROPIVACAINE HCL 2 MG/ML IJ SOLN
1.0000 mL | Freq: Once | INTRAMUSCULAR | Status: AC
  Administered 2017-06-19: 10 mL via EPIDURAL
  Filled 2017-06-19: qty 10

## 2017-06-19 MED ORDER — DEXAMETHASONE SODIUM PHOSPHATE 10 MG/ML IJ SOLN
10.0000 mg | Freq: Once | INTRAMUSCULAR | Status: AC
  Administered 2017-06-19: 10 mg
  Filled 2017-06-19: qty 1

## 2017-06-19 MED ORDER — FENTANYL CITRATE (PF) 100 MCG/2ML IJ SOLN
25.0000 ug | INTRAMUSCULAR | Status: DC | PRN
  Administered 2017-06-19: 50 ug via INTRAVENOUS
  Filled 2017-06-19: qty 2

## 2017-06-19 MED ORDER — MIDAZOLAM HCL 5 MG/5ML IJ SOLN
1.0000 mg | INTRAMUSCULAR | Status: DC | PRN
  Administered 2017-06-19: 2 mg via INTRAVENOUS
  Filled 2017-06-19: qty 5

## 2017-06-19 MED ORDER — LACTATED RINGERS IV SOLN
1000.0000 mL | Freq: Once | INTRAVENOUS | Status: AC
  Administered 2017-06-19: 1000 mL via INTRAVENOUS

## 2017-06-19 MED ORDER — LIDOCAINE HCL 2 % IJ SOLN
20.0000 mL | Freq: Once | INTRAMUSCULAR | Status: AC
  Administered 2017-06-19: 400 mg
  Filled 2017-06-19: qty 20

## 2017-06-19 MED ORDER — IOPAMIDOL (ISOVUE-M 200) INJECTION 41%
10.0000 mL | Freq: Once | INTRAMUSCULAR | Status: AC
  Administered 2017-06-19: 10 mL via EPIDURAL
  Filled 2017-06-19: qty 10

## 2017-06-19 MED ORDER — SODIUM CHLORIDE 0.9% FLUSH
1.0000 mL | Freq: Once | INTRAVENOUS | Status: AC
  Administered 2017-06-19: 10 mL

## 2017-06-19 NOTE — Progress Notes (Signed)
Patient's Name: Chase Mendez  MRN: [id number]  Referring Provider: Jodi Marble, MD  DOB: 1934/10/30  PCP: Jodi Marble, MD  DOS: 06/19/2017  Note by: Gaspar Cola, MD  Service setting: Ambulatory outpatient  Specialty: Interventional Pain Management  Patient type: Established  Location: [facility] (AMB) Pain Management Facility  Visit type: Interventional Procedure   Primary Reason for Visit: Interventional Pain Management Treatment. CC: Leg Pain (left, entire leg)  Procedure:  Anesthesia, Analgesia, Anxiolysis:  Type: Trans-Foraminal Epidural Steroid Injection Purpose: Diagnostic/Therapeutic Region: Posterolateral Lumbosacral Level: L4 & L5 Level Laterality: Left-Sided Paravertebral Target Area: The 6 o'clock position under the pedicle, on the affected side. Approach: Posterior Percutaneous Paravertebral approach. Position: Prone  Type: Moderate (Conscious) Sedation combined with Local Anesthesia Indication(s): Analgesia and Anxiety Route: Intravenous (IV) IV Access: Secured Sedation: Meaningful verbal contact was maintained at all times during the procedure  Local Anesthetic: Lidocaine 1-2%   Indications: 1. DDD (degenerative disc disease), lumbar   2. Chronic lower extremity pain (Left)   3. 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 Failed back surgical syndrome   5. Lower limb pain, inferior, left (L5) (Foot Drop)   6. Lumbosacral radiculopathy at L5    Pain Score: Pre-procedure: 5 /10 Post-procedure: 0-No pain/10  Pre-op Assessment:  Chase Mendez is a 83 y.o. (year old), male patient, seen today for interventional treatment. He  has a past surgical history that includes Cholecystectomy; Appendectomy; Back surgery; Eye surgery; and Esophagogastroduodenoscopy (egd) with propofol (N/A, 04/24/2017). Chase Mendez has a current medication list which includes the following prescription(s): allopurinol, aspirin ec, atorvastatin, benzonatate,  citalopram, cyanocobalamin, diazepam, docusate sodium, fluticasone, gabapentin, isosorbide dinitrate, levothyroxine, linaclotide, losartan, metformin, mirabegron er, omeprazole, ondansetron, oxycodone, sucralfate, testosterone, vitamin e, and metoclopramide, and the following Facility-Administered Medications: fentanyl, lactated ringers, and midazolam. His primarily concern today is the Leg Pain (left, entire leg)  The patient indicates having been to the [facility] yesterday where he got a new prescription that should last him another 30 days. The plan is for [place] to take over his medication management and consider doing a "Drug Holiday". He indicates that at the [facility] he was told that he may be a good candidate for an intrathecal pump.  Initial Vital Signs:  Pulse/HCG Rate: 73ECG Heart Rate: 80 Temp: 98.4 F (36.9 C) Resp: 16 BP: (!) 180/80 SpO2: 97 %  BMI: Estimated body mass index is 25.09 kg/m as calculated from the following:   Height as of this encounter: 6' (1.829 m).   Weight as of this encounter: 185 lb (83.9 kg).  Risk Assessment: Allergies: Reviewed. He is allergic to ibuprofen; sulfa antibiotics; sulfasalazine; and lidocaine.  Allergy Precautions: None required Coagulopathies: Reviewed. None identified.  Blood-thinner therapy: None at this time Active Infection(s): Reviewed. None identified. Chase Mendez is afebrile  Site Confirmation: Chase Mendez was asked to confirm the procedure and laterality before marking the site Procedure checklist: Completed Consent: Before the procedure and under the influence of no sedative(s), amnesic(s), or anxiolytics, the patient was informed of the treatment options, risks and possible complications. To fulfill our ethical and legal obligations, as recommended by the American Medical Association's Code of Ethics, I have informed the patient of my clinical impression; the nature and purpose of the treatment or procedure; the risks,  benefits, and possible complications of the intervention; the alternatives, including doing nothing; the risk(s) and benefit(s) of the alternative treatment(s) or procedure(s); and the risk(s) and  benefit(s) of doing nothing. The patient was provided information about the general risks and possible complications associated with the procedure. These may include, but are not limited to: failure to achieve desired goals, infection, bleeding, organ or nerve damage, allergic reactions, paralysis, and death. In addition, the patient was informed of those risks and complications associated to Spine-related procedures, such as failure to decrease pain; infection (i.e.: Meningitis, epidural or intraspinal abscess); bleeding (i.e.: epidural hematoma, subarachnoid hemorrhage, or any other type of intraspinal or peri-dural bleeding); organ or nerve damage (i.e.: Any type of peripheral nerve, nerve root, or spinal cord injury) with subsequent damage to sensory, motor, and/or autonomic systems, resulting in permanent pain, numbness, and/or weakness of one or several areas of the body; allergic reactions; (i.e.: anaphylactic reaction); and/or death. Furthermore, the patient was informed of those risks and complications associated with the medications. These include, but are not limited to: allergic reactions (i.e.: anaphylactic or anaphylactoid reaction(s)); adrenal axis suppression; blood sugar elevation that in diabetics may result in ketoacidosis or comma; water retention that in patients with history of congestive heart failure may result in shortness of breath, pulmonary edema, and decompensation with resultant heart failure; weight gain; swelling or edema; medication-induced neural toxicity; particulate matter embolism and blood vessel occlusion with resultant organ, and/or nervous system infarction; and/or aseptic necrosis of one or more joints. Finally, the patient was informed that Medicine is not an exact science;  therefore, there is also the possibility of unforeseen or unpredictable risks and/or possible complications that may result in a catastrophic outcome. The patient indicated having understood very clearly. We have given the patient no guarantees and we have made no promises. Enough time was given to the patient to ask questions, all of which were answered to the patient's satisfaction. Chase Mendez has indicated that he wanted to continue with the procedure. Attestation: I, the ordering provider, attest that I have discussed with the patient the benefits, risks, side-effects, alternatives, likelihood of achieving goals, and potential problems during recovery for the procedure that I have provided informed consent. Date  Time: 06/19/2017  9:08 AM  Pre-Procedure Preparation:  Monitoring: As per clinic protocol. Respiration, ETCO2, SpO2, BP, heart rate and rhythm monitor placed and checked for adequate function Safety Precautions: Patient was assessed for positional comfort and pressure points before starting the procedure. Time-out: I initiated and conducted the "Time-out" before starting the procedure, as per protocol. The patient was asked to participate by confirming the accuracy of the "Time Out" information. Verification of the correct person, site, and procedure were performed and confirmed by me, the nursing staff, and the patient. "Time-out" conducted as per Joint Commission's Universal Protocol (UP.01.01.01). Time: 1037  Description of Procedure:       Area Prepped: Entire Posterior Lumbosacral Area Prepping solution: ChloraPrep (2% chlorhexidine gluconate and 70% isopropyl alcohol) Safety Precautions: Aspiration looking for blood return was conducted prior to all injections. At no point did we inject any substances, as a needle was being advanced. No attempts were made at seeking any paresthesias. Safe injection practices and needle disposal techniques used. Medications properly checked for  expiration dates. SDV (single dose vial) medications used. Description of the Procedure: Protocol guidelines were followed. The patient was placed in position over the procedure table. The target area was identified and the area prepped in the usual manner. Skin desensitized using vapocoolant spray. Skin & deeper tissues infiltrated with local anesthetic. Appropriate amount of time allowed to pass for local anesthetics to take effect. The procedure  needles were then advanced to the target area. Proper needle placement secured. Negative aspiration confirmed. Solution injected in intermittent fashion, asking for systemic symptoms every 0.5cc of injectate. The needles were then removed and the area cleansed, making sure to leave some of the prepping solution back to take advantage of its long term bactericidal properties. Vitals:   06/19/17 1050 06/19/17 1055 06/19/17 1107 06/19/17 1116  BP: (!) 175/62 (!) 170/73 (!) 164/74 (!) 157/70  Pulse:      Resp: 20 (!) 22 (!) 21 16  Temp:      TempSrc:      SpO2: 96% 97% 94% 100%  Weight:      Height:        Start Time: 1037 hrs. End Time: 1048 hrs. Materials:  Needle(s) Type: Regular needle Gauge: 22G Length: 3.5-in Medication(s): Please see orders for medications and dosing details.  Imaging Guidance (Spinal):  Type of Imaging Technique: Fluoroscopy Guidance (Spinal) Indication(s): Assistance in needle guidance and placement for procedures requiring needle placement in or near specific anatomical locations not easily accessible without such assistance. Exposure Time: Please see nurses notes. Contrast: Before injecting any contrast, we confirmed that the patient did not have an allergy to iodine, shellfish, or radiological contrast. Once satisfactory needle placement was completed at the desired level, radiological contrast was injected. Contrast injected under live fluoroscopy. No contrast complications. See chart for type and volume of contrast  used. Fluoroscopic Guidance: I was personally present during the use of fluoroscopy. "Tunnel Vision Technique" used to obtain the best possible view of the target area. Parallax error corrected before commencing the procedure. "Direction-depth-direction" technique used to introduce the needle under continuous pulsed fluoroscopy. Once target was reached, antero-posterior, oblique, and lateral fluoroscopic projection used confirm needle placement in all planes. Images permanently stored in EMR. Interpretation: I personally interpreted the imaging intraoperatively. Adequate needle placement confirmed in multiple planes. Appropriate spread of contrast into desired area was observed. No evidence of afferent or efferent intravascular uptake. No intrathecal or subarachnoid spread observed. Permanent images saved into the patient's record.  Antibiotic Prophylaxis:   Anti-infectives (From admission, onward)   None     Indication(s): None identified  Post-operative Assessment:  Post-procedure Vital Signs:  Pulse/HCG Rate: 7368 Temp: 98.4 F (36.9 C) Resp: 16 BP: (!) 157/70 SpO2: 100 %  EBL: None  Complications: No immediate post-treatment complications observed by team, or reported by patient.  Note: The patient tolerated the entire procedure well. A repeat set of vitals were taken after the procedure and the patient was kept under observation following institutional policy, for this type of procedure. Post-procedural neurological assessment was performed, showing return to baseline, prior to discharge. The patient was provided with post-procedure discharge instructions, including a section on how to identify potential problems. Should any problems arise concerning this procedure, the patient was given instructions to immediately contact us, at any time, without hesitation. In any case, we plan to contact the patient by telephone for a follow-up status report regarding this interventional  procedure.  Comments:  No additional relevant information.  Plan of Care    Imaging Orders     DG C-Arm 1-60 Min-No Report  Procedure Orders     Lumbar Transforaminal Epidural  Medications ordered for procedure: Meds ordered this encounter  Medications  . iopamidol (ISOVUE-M) 41 % intrathecal injection 10 mL    Must be Myelogram-compatible. If not available, you may substitute with a water-soluble, non-ionic, hypoallergenic, myelogram-compatible radiological contrast medium.  [name] lidocaine (XYLOCAINE)  2 % (with pres) injection 400 mg  . midazolam (VERSED) 5 MG/5ML injection 1-2 mg    Make sure Flumazenil is available in the pyxis when using this medication. If oversedation occurs, administer 0.2 mg IV over 15 sec. If after 45 sec no response, administer 0.2 mg again over 1 min; may repeat at 1 min intervals; not to exceed 4 doses (1 mg)  . fentaNYL (SUBLIMAZE) injection 25-50 mcg    Make sure Narcan is available in the pyxis when using this medication. In the event of respiratory depression (RR< 8/min): Titrate NARCAN (naloxone) in increments of 0.1 to 0.2 mg IV at 2-3 minute intervals, until desired degree of reversal.  . lactated ringers infusion 1,000 mL  . sodium chloride flush (NS) 0.9 % injection 1 mL  . ropivacaine (PF) 2 mg/mL (0.2%) (NAROPIN) injection 1 mL  . dexamethasone (DECADRON) injection 10 mg  . sodium chloride flush (NS) 0.9 % injection 1 mL  . ropivacaine (PF) 2 mg/mL (0.2%) (NAROPIN) injection 1 mL  . dexamethasone (DECADRON) injection 10 mg   Medications administered: We administered iopamidol, lidocaine, midazolam, fentaNYL, lactated ringers, sodium chloride flush, ropivacaine (PF) 2 mg/mL (0.2%), dexamethasone, sodium chloride flush, ropivacaine (PF) 2 mg/mL (0.2%), and dexamethasone.  See the medical record for exact dosing, route, and time of administration.  New Prescriptions   No medications on file   Disposition: Discharge home  Discharge Date & Time:  06/19/2017; 1117 hrs.   Physician-requested Follow-up: Return for post-procedure eval (2 wks), w/ Dr. Dossie Arbour.  Future Appointments  Date Time Provider [place]  07/11/2017  1:45 PM Milinda Pointer, MD [facility] None   Primary Care Physician: Jodi Marble, MD Location: [facility] Out[facility] Management Facility Note by: Gaspar Cola, MD Date: 06/19/2017; Time: 11:18 AM  Disclaimer:  Medicine is not an Chief Strategy Officer. The only guarantee in medicine is that nothing is guaranteed. It is important to note that the decision to proceed with this intervention was based on the information collected from the patient. The Data and conclusions were drawn from the patient's questionnaire, the interview, and the physical examination. Because the information was provided in large part by the patient, it cannot be guaranteed that it has not been purposely or unconsciously manipulated. Every effort has been made to obtain as much relevant data as possible for this evaluation. It is important to note that the conclusions that lead to this procedure are derived in large part from the available data. Always take into account that the treatment will also be dependent on availability of resources and existing treatment guidelines, considered by other Pain Management Practitioners as being common knowledge and practice, at the time of the intervention. For Medico-Legal purposes, it is also important to point out that variation in procedural techniques and pharmacological choices are the acceptable norm. The indications, contraindications, technique, and results of the above procedure should only be interpreted and judged by a Board-Certified Interventional Pain Specialist with extensive familiarity and expertise in the same exact procedure and technique.

## 2017-06-19 NOTE — Patient Instructions (Signed)

## 2017-06-19 NOTE — Progress Notes (Signed)
Safety precautions to be maintained throughout the outpatient stay will include: orient to surroundings, keep bed in low position, maintain call bell within reach at all times, provide assistance with transfer out of bed and ambulation.  

## 2017-06-20 ENCOUNTER — Telehealth: Payer: Self-pay | Admitting: *Deleted

## 2017-06-20 NOTE — Telephone Encounter (Signed)
Denies complications post procedure. 

## 2017-07-11 ENCOUNTER — Encounter: Payer: Self-pay | Admitting: Pain Medicine

## 2017-07-11 ENCOUNTER — Ambulatory Visit: Payer: PRIVATE HEALTH INSURANCE | Attending: Pain Medicine | Admitting: Pain Medicine

## 2017-07-11 ENCOUNTER — Other Ambulatory Visit: Payer: Self-pay

## 2017-07-11 VITALS — BP 161/82 | HR 68 | Temp 98.6°F | Resp 18 | Ht 72.0 in | Wt 186.0 lb

## 2017-07-11 DIAGNOSIS — M21372 Foot drop, left foot: Secondary | ICD-10-CM | POA: Diagnosis not present

## 2017-07-11 DIAGNOSIS — G8929 Other chronic pain: Secondary | ICD-10-CM

## 2017-07-11 DIAGNOSIS — G894 Chronic pain syndrome: Secondary | ICD-10-CM | POA: Insufficient documentation

## 2017-07-11 DIAGNOSIS — M79605 Pain in left leg: Secondary | ICD-10-CM | POA: Diagnosis not present

## 2017-07-11 DIAGNOSIS — M5442 Lumbago with sciatica, left side: Secondary | ICD-10-CM

## 2017-07-11 DIAGNOSIS — M961 Postlaminectomy syndrome, not elsewhere classified: Secondary | ICD-10-CM

## 2017-07-11 MED ORDER — OXYCODONE HCL 15 MG PO TABS: 15.0000 mg | ORAL_TABLET | Freq: Four times a day (QID) | ORAL | 0 refills | Status: DC | PRN

## 2017-07-11 NOTE — Progress Notes (Signed)
Safety precautions to be maintained throughout the outpatient stay will include: orient to surroundings, keep bed in low position, maintain call bell within reach at all times, provide assistance with transfer out of bed and ambulation.  

## 2017-07-11 NOTE — Progress Notes (Signed)
Patient's Name: Chase Mendez  MRN: [id number]  Referring Provider: Jodi Marble, MD  DOB: 1934-04-04  PCP: Jodi Marble, MD  DOS: 07/11/2017  Note by: Gaspar Cola, MD  Service setting: Ambulatory outpatient  Specialty: Interventional Pain Management  Location: [facility] (AMB) Pain Management Facility    Patient type: Established   Primary Reason(s) for Visit: Encounter for post-procedure evaluation of chronic illness with mild to moderate exacerbation CC: Leg Pain (left) and Back Pain (left lower)  HPI  Chase Mendez is a 83 y.o. year old, male patient, who comes today for a post-procedure evaluation. He has Spinal accessory neuropathy; Varicose veins of both lower extremities with pain; Lower extremity pain, bilateral; Venous reflux; Lower limb pain, inferior, left (L5) (Foot Drop); Chronic low back pain (2) (Bilateral) (ML) (L>R) with sciatica (Left); Chronic lower extremity pain (1) (Left); Failed back surgical syndrome; Foot drop (Left); Lumbosacral radiculopathy at L5; Disorder of skeletal system; Pharmacologic therapy; Problems influencing health status; Opioid Drug tolerance; History of acute renal failure (12/08/2014); History of encephalopathy (12/08/2014); History of sepsis (12/08/2014); DDD (degenerative disc disease), lumbar; and Chronic pain syndrome on their problem list. His primarily concern today is the Leg Pain (left) and Back Pain (left lower)  Pain Assessment: Location: Left Leg Radiating: radiates from low back down left leg on the side to outside of foot Onset: More than a month ago Duration: Chronic pain Quality: Sharp, Cramping Severity: 6 /10 (subjective, self-reported pain score)  Note: Reported level is inconsistent with clinical observations. Clinically the patient looks like a 3/10 A 3/10 is viewed as "Moderate" and described as significantly interfering with activities of daily living (ADL). It becomes difficult to feed, bathe, get dressed, get on and off the  toilet or to perform personal hygiene functions. Difficult to get in and out of bed or a chair without assistance. Very distracting. With effort, it can be ignored when deeply involved in activities. Chase Mendez does not seem to understand the use of our objective pain scale When using our objective Pain Scale, levels between 6 and 10/10 are said to belong in an emergency room, as it progressively worsens from a 6/10, described as severely limiting, requiring emergency care not usually available at an outpatient pain management facility. At a 6/10 level, communication becomes difficult and requires great effort. Assistance to reach the emergency department may be required. Facial flushing and profuse sweating along with potentially dangerous increases in heart rate and blood pressure will be evident. Effect on ADL: not able to let him do household chores Timing: Constant Modifying factors: lying down BP: (!) 161/82  HR: 68  Chase Mendez comes in today for post-procedure evaluation after the treatment done on 06/20/2017.  Further details on both, my assessment(s), as well as the proposed treatment plan, please see below.  Post-Procedure Assessment  06/19/2017 Procedure: Diagnostic left-sided L4 and L5 TFESI #1 under fluoroscopic guidance and IV sedation Pre-procedure pain score:  5/10 Post-procedure pain score: 0/10 (100% relief) Influential Factors: BMI: 25.23 kg/m Intra-procedural challenges: None observed.         Assessment challenges: None detected.              Reported side-effects: None.        Post-procedural adverse reactions or complications: None reported         Sedation: Sedation provided. When no sedatives are used, the analgesic levels obtained are directly associated to the effectiveness of the local anesthetics. However, when sedation is provided, the  level of analgesia obtained during the initial 1 hour following the intervention, is believed to be the result of a combination of  factors. These factors may include, but are not limited to: 1. The effectiveness of the local anesthetics used. 2. The effects of the analgesic(s) and/or anxiolytic(s) used. 3. The degree of discomfort experienced by the patient at the time of the procedure. 4. The patients ability and reliability in recalling and recording the events. 5. The presence and influence of possible secondary gains and/or psychosocial factors. Reported result: Relief experienced during the 1st hour after the procedure: 100 % (Ultra-Short Term Relief)            Interpretative annotation: Clinically appropriate result. Analgesia during this period is likely to be Local Anesthetic and/or IV Sedative (Analgesic/Anxiolytic) related.          Effects of local anesthetic: The analgesic effects attained during this period are directly associated to the localized infiltration of local anesthetics and therefore cary significant diagnostic value as to the etiological location, or anatomical origin, of the pain. Expected duration of relief is directly dependent on the pharmacodynamics of the local anesthetic used. Long-acting (4-6 hours) anesthetics used.  Reported result: Relief during the next 4 to 6 hour after the procedure: 100 % (Short-Term Relief)            Interpretative annotation: Clinically appropriate result. Analgesia during this period is likely to be Local Anesthetic-related.          Long-term benefit: Defined as the period of time past the expected duration of local anesthetics (1 hour for short-acting and 4-6 hours for long-acting). With the possible exception of prolonged sympathetic blockade from the local anesthetics, benefits during this period are typically attributed to, or associated with, other factors such as analgesic sensory neuropraxia, antiinflammatory effects, or beneficial biochemical changes provided by agents other than the local anesthetics.  Reported result: Extended relief following procedure: 75 %  (Long-Term Relief)            Interpretative annotation: Clinically possible results. Good relief. Therapeutic success. Persistent algesic mechanism detected.          Current benefits: Defined as reported results that persistent at this point in time.   Analgesia: 75-100 % Mr. Meunier reports improvement of extremity symptoms. Function: Mr. [place] reports improvement in function ROM: Mr. Campione reports improvement in ROM Interpretative annotation: Ongoing benefit. Therapeutic benefit observed. Effective palliative intervention. Benefit could be steroid-related.  Interpretation: Results would suggest a successful diagnostic intervention.                  Plan:  Set up procedure as a PRN palliative treatment option for this patient.                Laboratory Chemistry  Inflammation Markers (CRP: Acute Phase) (ESR: Chronic Phase) Lab Results  Component Value Date   LATICACIDVEN 1.1 12/08/2014                         Renal Markers Lab Results  Component Value Date   BUN 16 01/25/2017   CREATININE 0.98 01/25/2017   GFRAA >60 01/25/2017   GFRNONAA >60 01/25/2017                             Hepatic Markers Lab Results  Component Value Date   AST 28 08/21/2016   ALT 18 08/21/2016   ALBUMIN  4.1 08/21/2016                        Neuropathy Markers Lab Results  Component Value Date   HGBA1C 6.3 (H) 12/08/2014                        Hematology Parameters Lab Results  Component Value Date   PLT 169 01/25/2017   HGB 16.6 01/25/2017   HCT 47.0 01/25/2017                        CV Markers Lab Results  Component Value Date   TROPONINI <0.03 01/25/2017                         Note: Lab results reviewed.  Recent Diagnostic Imaging Results  DG C-Arm 1-60 Min-No Report Fluoroscopy was utilized by the requesting physician.  No radiographic  interpretation.   Complexity Note: I personally reviewed the fluoroscopic imaging of the procedure.                        Meds    Current Outpatient Medications:  .  allopurinol (ZYLOPRIM) 100 MG tablet, Take 100 mg by mouth daily., Disp: , Rfl: 0 .  aspirin EC 81 MG tablet, Take 81 mg by mouth daily., Disp: , Rfl:  .  atorvastatin (LIPITOR) 10 MG tablet, Take 10 mg by mouth every evening., Disp: , Rfl: 0 .  benzonatate (TESSALON) 100 MG capsule, take 1 capsule by mouth three times a day, Disp: , Rfl: 0 .  citalopram (CELEXA) 20 MG tablet, Take 20 mg by mouth daily., Disp: , Rfl:  .  Cyanocobalamin 1000 MCG/ML LIQD, Take 1,000 mg by mouth daily., Disp: , Rfl:  .  docusate sodium (COLACE) 50 MG capsule, Take 50 mg by mouth 2 (two) times daily., Disp: , Rfl:  .  fluticasone (FLONASE) 50 MCG/ACT nasal spray, fluticasone propionate 50 mcg/act susp, Disp: , Rfl:  .  gabapentin (NEURONTIN) 600 MG tablet, Take 600 mg by mouth 4 (four) times daily., Disp: , Rfl: 0 .  isosorbide dinitrate (ISORDIL) 30 MG tablet, Take 30 mg by mouth 3 (three) times daily., Disp: , Rfl:  .  levothyroxine (SYNTHROID, LEVOTHROID) 150 MCG tablet, Take 150 mcg by mouth every morning., Disp: , Rfl: 0 .  linaclotide (LINZESS) 290 MCG CAPS capsule, Take 290 mcg by mouth daily before breakfast., Disp: , Rfl:  .  losartan (COZAAR) 50 MG tablet, Take 50 mg by mouth daily., Disp: , Rfl:  .  metFORMIN (GLUCOPHAGE) 500 MG tablet, Take 500 mg by mouth every evening., Disp: , Rfl: 0 .  mirabegron ER (MYRBETRIQ) 25 MG TB24 tablet, Take 25 mg by mouth daily., Disp: , Rfl:  .  omeprazole (PRILOSEC) 20 MG capsule, Take 20 mg by mouth daily., Disp: , Rfl:  .  ondansetron (ZOFRAN ODT) 4 MG disintegrating tablet, Take 1 tablet (4 mg total) by mouth every 8 (eight) hours as needed for nausea or vomiting., Disp: 20 tablet, Rfl: 0 .  [START ON 07/25/2017] oxyCODONE (ROXICODONE) 15 MG immediate release tablet, Take 1 tablet (15 mg total) by mouth every 6 (six) hours as needed for pain., Disp: 120 tablet, Rfl: 0 .  sucralfate (CARAFATE) 1 g tablet, Take 1 g by mouth 4 (four)  times daily -  with meals and at bedtime., Disp: ,  Rfl:  .  testosterone (ANDROGEL) 50 MG/5GM (1%) GEL, Place onto the skin daily., Disp: , Rfl:  .  vitamin E 400 UNIT capsule, Take 400 Units by mouth daily., Disp: , Rfl:   ROS  Constitutional: Denies any fever or chills Gastrointestinal: No reported hemesis, hematochezia, vomiting, or acute GI distress Musculoskeletal: Denies any acute onset joint swelling, redness, loss of ROM, or weakness Neurological: No reported episodes of acute onset apraxia, aphasia, dysarthria, agnosia, amnesia, paralysis, loss of coordination, or loss of consciousness  Allergies  Mr. Loeper is allergic to ibuprofen; sulfa antibiotics; sulfasalazine; and lidocaine.  [place]  Drug: Mr. Crace  reports that he does not use drugs. Alcohol:  reports that he does not drink alcohol. Tobacco:  reports that he has been smoking cigars.  He has never used smokeless tobacco. Medical:  has a past medical history of Acute encephalopathy (12/08/2014), Anxiety, ARF (acute renal failure) ([place]) (12/08/2014), Back pain, Benign neoplasm of large bowel, Capsulitis, Chronic back pain, Depression, Diabetes mellitus without complication ([place]), Dysphagia, Exostosis, GERD (gastroesophageal reflux disease), Gout, Hypothyroidism, Insomnia, Low testosterone, Microscopic hematuria, Pneumonia (12/07/2014), Pressure ulcer (12/09/2014), Sepsis ([place]) (12/08/2014), and Thyroid disease. Surgical: Mr. Whirley  has a past surgical history that includes Cholecystectomy; Appendectomy; Back surgery; Eye surgery; and Esophagogastroduodenoscopy (egd) with propofol (N/A, 04/24/2017). Family: family history includes Diabetes in his father; Heart disease in his mother.  Constitutional Exam  General appearance: Well nourished, well developed, and well hydrated. In no apparent acute distress Vitals:   07/11/17 1324  BP: (!) 161/82  Pulse: 68  Resp: 18  Temp: 98.6 F (37 C)  SpO2: 95%  Weight: 186 lb (84.4 kg)   Height: 6' (1.829 m)   BMI Assessment: Estimated body mass index is 25.23 kg/m as calculated from the following:   Height as of this encounter: 6' (1.829 m).   Weight as of this encounter: 186 lb (84.4 kg).  BMI interpretation table: BMI level Category Range association with higher incidence of chronic pain  <18 kg/m2 Underweight   18.5-24.9 kg/m2 Ideal body weight   25-29.9 kg/m2 Overweight Increased incidence by 20%  30-34.9 kg/m2 Obese (Class I) Increased incidence by 68%  35-39.9 kg/m2 Severe obesity (Class II) Increased incidence by 136%  >40 kg/m2 Extreme obesity (Class III) Increased incidence by 254%   Patient's current BMI Ideal Body weight  Body mass index is 25.23 kg/m. Ideal body weight: 77.6 kg (171 lb 1.2 oz) Adjusted ideal body weight: 80.3 kg (177 lb 0.7 oz)   BMI Readings from Last 4 Encounters:  07/11/17 25.23 kg/m  06/19/17 25.09 kg/m  05/30/17 25.63 kg/m  05/16/17 24.68 kg/m   Wt Readings from Last 4 Encounters:  07/11/17 186 lb (84.4 kg)  06/19/17 185 lb (83.9 kg)  05/30/17 189 lb (85.7 kg)  05/16/17 182 lb (82.6 kg)  Psych/Mental status: Alert, oriented x 3 (person, place, & time)       Eyes: PERLA Respiratory: No evidence of acute respiratory distress  Cervical Spine Area Exam  Skin & Axial Inspection: No masses, redness, edema, swelling, or associated skin lesions Alignment: Symmetrical Functional ROM: Unrestricted ROM      Stability: No instability detected Muscle Tone/Strength: Functionally intact. No obvious neuro-muscular anomalies detected. Sensory (Neurological): Unimpaired Palpation: No palpable anomalies              Upper Extremity (UE) Exam    Side: Right upper extremity  Side: Left upper extremity  Skin & Extremity Inspection: Skin color, temperature, and hair  growth are WNL. No peripheral edema or cyanosis. No masses, redness, swelling, asymmetry, or associated skin lesions. No contractures.  Skin & Extremity Inspection: Skin  color, temperature, and hair growth are WNL. No peripheral edema or cyanosis. No masses, redness, swelling, asymmetry, or associated skin lesions. No contractures.  Functional ROM: Unrestricted ROM          Functional ROM: Unrestricted ROM          Muscle Tone/Strength: Functionally intact. No obvious neuro-muscular anomalies detected.  Muscle Tone/Strength: Functionally intact. No obvious neuro-muscular anomalies detected.  Sensory (Neurological): Unimpaired          Sensory (Neurological): Unimpaired          Palpation: No palpable anomalies              Palpation: No palpable anomalies              Provocative Test(s):  Phalen's test: deferred Tinel's test: deferred Apley's scratch test (touch opposite shoulder):  Action 1 (Across chest): deferred Action 2 (Overhead): deferred Action 3 (LB reach): deferred   Provocative Test(s):  Phalen's test: deferred Tinel's test: deferred Apley's scratch test (touch opposite shoulder):  Action 1 (Across chest): deferred Action 2 (Overhead): deferred Action 3 (LB reach): deferred    Thoracic Spine Area Exam  Skin & Axial Inspection: No masses, redness, or swelling Alignment: Symmetrical Functional ROM: Unrestricted ROM Stability: No instability detected Muscle Tone/Strength: Functionally intact. No obvious neuro-muscular anomalies detected. Sensory (Neurological): Unimpaired Muscle strength & Tone: No palpable anomalies  Lumbar Spine Area Exam  Skin & Axial Inspection: No masses, redness, or swelling Alignment: Symmetrical Functional ROM: Improved after treatment       Stability: No instability detected Muscle Tone/Strength: Functionally intact. No obvious neuro-muscular anomalies detected. Sensory (Neurological): Unimpaired Palpation: No palpable anomalies       Provocative Tests: Lumbar Hyperextension/rotation test: deferred today       Lumbar quadrant test (Kemp's test): deferred today       Lumbar Lateral bending test: deferred  today       Patrick's Maneuver: deferred today                   FABER test: deferred today       Thigh-thrust test: deferred today       S-I compression test: deferred today       S-I distraction test: deferred today        Gait & Posture Assessment  Ambulation: Patient ambulates using a cane Gait: Limited. Using assistive device to ambulate Posture: Antalgic   Lower Extremity Exam    Side: Right lower extremity  Side: Left lower extremity  Stability: No instability observed          Stability: No instability observed          Skin & Extremity Inspection: Skin color, temperature, and hair growth are WNL. No peripheral edema or cyanosis. No masses, redness, swelling, asymmetry, or associated skin lesions. No contractures.  Skin & Extremity Inspection: Skin color, temperature, and hair growth are WNL. No peripheral edema or cyanosis. No masses, redness, swelling, asymmetry, or associated skin lesions. No contractures.  Functional ROM: Unrestricted ROM                  Functional ROM: Unrestricted ROM                  Muscle Tone/Strength: Functionally intact. No obvious neuro-muscular anomalies detected.  Muscle Tone/Strength: L5 myotomal (EHL) weakness (  Toe Dorsiflexion)  Sensory (Neurological): Unimpaired  Sensory (Neurological): Dermatomal pain pattern  Palpation: No palpable anomalies  Palpation: No palpable anomalies   Assessment  Primary Diagnosis & Pertinent Problem List: The primary encounter diagnosis was Chronic lower extremity pain (1) (Left). Diagnoses of Failed back surgical syndrome, Chronic low back pain (Bilateral) (ML) (L>R) with sciatica (Left), Foot drop (Left), and Chronic pain syndrome were also pertinent to this visit.  Status Diagnosis  Improved Controlled Controlled 1. Chronic lower extremity pain (1) (Left)   2. Failed back surgical syndrome   3. 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 Foot drop (Left)   5. Chronic pain syndrome      Problems updated and reviewed during this visit: No problems updated. Plan of Care  Pharmacotherapy (Medications Ordered): Meds ordered this encounter  Medications  . oxyCODONE (ROXICODONE) 15 MG immediate release tablet    Sig: Take 1 tablet (15 mg total) by mouth every 6 (six) hours as needed for pain.    Dispense:  120 tablet    Refill:  0    Do not place this medication, or any other prescription from our practice, on "Automatic Refill". Patient may have prescription filled one day early if pharmacy is closed on scheduled refill date. Do not fill until: 07/25/17 To last until: 08/24/17   Medications administered today: Evert Kohl. Alberg had no medications administered during this visit.  Procedure Orders    No procedure(s) ordered today   Lab Orders  No laboratory test(s) ordered today   Imaging Orders  No imaging studies ordered today    Referral Orders     Ambulatory referral to Psychology  Interventional management options: Planned, scheduled, and/or pending:   Today we have provided the patient with written information regarding the spinal cord stimulators.  We will be sending him to have a medical psychology evaluation for possible implant.  In addition, today we have taken over his medications and given him his first prescription for his oxycodone.  We have talked about the possibility of doing a "Drug Holiday" in order to decrease his tolerance    Considering:   Diagnostic caudal epidural steroid injection + epidurogram Possible Racz procedure Diagnostic bilateral lumbar facet block Possible bilateral lumbar facet RFA Possible candidate for intrathecal pump trial and implant Possible candidate for spinal cord stimulator trial and implant   Palliative PRN treatment(s):   Diagnostic left-sided L4 and L5 TFESI #2    Provider-requested follow-up: Return in about 1 month (around 08/10/2017) for Med-Mgmt, w/ Dionisio David, NP.  Future Appointments  Date  Time Provider [place]  08/07/2017  9:00 AM Vevelyn Francois, NP [facility] None   Primary Care Physician: Jodi Marble, MD Location: [facility] Note by: Gaspar Cola, MD Date: 07/11/2017; Time: 7:11 AM

## 2017-07-11 NOTE — Patient Instructions (Signed)
____________________________________________________________________________________________  Medication Rules  Applies to: All patients receiving prescriptions (written or electronic).  Pharmacy of record: Pharmacy where electronic prescriptions will be sent. If written prescriptions are taken to a different pharmacy, please inform the nursing staff. The pharmacy listed in the electronic medical record should be the one where you would like electronic prescriptions to be sent.  Prescription refills: Only during scheduled appointments. Applies to both, written and electronic prescriptions.  NOTE: The following applies primarily to controlled substances (Opioid* Pain Medications).   Patient's responsibilities: 1. Pain Pills: Bring all pain pills to every appointment (except for procedure appointments). 2. Pill Bottles: Bring pills in original pharmacy bottle. Always bring newest bottle. Bring bottle, even if empty. 3. Medication refills: You are responsible for knowing and keeping track of what medications you need refilled. The day before your appointment, write a list of all prescriptions that need to be refilled. Bring that list to your appointment and give it to the admitting nurse. Prescriptions will be written only during appointments. If you forget a medication, it will not be "Called in", "Faxed", or "electronically sent". You will need to get another appointment to get these prescribed. 4. Prescription Accuracy: You are responsible for carefully inspecting your prescriptions before leaving our office. Have the discharge nurse carefully go over each prescription with you, before taking them home. Make sure that your name is accurately spelled, that your address is correct. Check the name and dose of your medication to make sure it is accurate. Check the number of pills, and the written instructions to make sure they are clear and accurate. Make sure that you are given enough medication to last  until your next medication refill appointment. 5. Taking Medication: Take medication as prescribed. Never take more pills than instructed. Never take medication more frequently than prescribed. Taking less pills or less frequently is permitted and encouraged, when it comes to controlled substances (written prescriptions).  6. Inform other Doctors: Always inform, all of your healthcare providers, of all the medications you take. 7. Pain Medication from other Providers: You are not allowed to accept any additional pain medication from any other Doctor or Healthcare provider. There are two exceptions to this rule. (see below) In the event that you require additional pain medication, you are responsible for notifying us, as stated below. 8. Medication Agreement: You are responsible for carefully reading and following our Medication Agreement. This must be signed before receiving any prescriptions from our practice. Safely store a copy of your signed Agreement. Violations to the Agreement will result in no further prescriptions. (Additional copies of our Medication Agreement are available upon request.) 9. Laws, Rules, & Regulations: All patients are expected to follow all Federal and State Laws, Statutes, Rules, & Regulations. Ignorance of the Laws does not constitute a valid excuse. The use of any illegal substances is prohibited. 10. Adopted CDC guidelines & recommendations: Target dosing levels will be at or below 60 MME/day. Use of benzodiazepines** is not recommended.  Exceptions: There are only two exceptions to the rule of not receiving pain medications from other Healthcare Providers. 1. Exception #1 (Emergencies): In the event of an emergency (i.e.: accident requiring emergency care), you are allowed to receive additional pain medication. However, you are responsible for: As soon as you are able, call our office [phone], at any time of the day or night, and leave a message stating your name, the  date and nature of the emergency, and the name and dose of the medication   prescribed. In the event that your call is answered by a member of our staff, make sure to document and save the date, time, and the name of the person that took your information.  2. Exception #2 (Planned Surgery): In the event that you are scheduled by another doctor or dentist to have any type of surgery or procedure, you are allowed (for a period no longer than 30 days), to receive additional pain medication, for the acute post-op pain. However, in this case, you are responsible for picking up a copy of our "Post-op Pain Management for Surgeons" handout, and giving it to your surgeon or dentist. This document is available at our office, and does not require an appointment to obtain it. Simply go to our office during business hours (Monday-Thursday from 8:00 AM to 4:00 PM) (Friday 8:00 AM to 12:00 Noon) or if you have a scheduled appointment with us, prior to your surgery, and ask for it by name. In addition, you will need to provide us with your name, name of your surgeon, type of surgery, and date of procedure or surgery.  *Opioid medications include: morphine, codeine, oxycodone, oxymorphone, hydrocodone, hydromorphone, meperidine, tramadol, tapentadol, buprenorphine, fentanyl, methadone. **Benzodiazepine medications include: diazepam (Valium), alprazolam (Xanax), clonazepam (Klonopine), lorazepam (Ativan), clorazepate (Tranxene), chlordiazepoxide (Librium), estazolam (Prosom), oxazepam (Serax), temazepam (Restoril), triazolam (Halcion) (Last updated: 04/05/2017) ____________________________________________________________________________________________   ____________________________________________________________________________________________  Medication Recommendations and Reminders  Applies to: All patients receiving prescriptions (written and/or electronic).  Medication Rules & Regulations: These rules and  regulations exist for your safety and that of others. They are not flexible and neither are we. Dismissing or ignoring them will be considered "non-compliance" with medication therapy, resulting in complete and irreversible termination of such therapy. (See document titled "Medication Rules" for more details.) In all conscience, because of safety reasons, we cannot continue providing a therapy where the patient does not follow instructions.  Pharmacy of record:   Definition: This is the pharmacy where your electronic prescriptions will be sent.   We do not endorse any particular pharmacy.  You are not restricted in your choice of pharmacy.  The pharmacy listed in the electronic medical record should be the one where you want electronic prescriptions to be sent.  If you choose to change pharmacy, simply notify our nursing staff of your choice of new pharmacy.  Recommendations:  Keep all of your pain medications in a safe place, under lock and key, even if you live alone.   After you fill your prescription, take 1 week's worth of pills and put them away in a safe place. You should keep a separate, properly labeled bottle for this purpose. The remainder should be kept in the original bottle. Use this as your primary supply, until it runs out. Once it's gone, then you know that you have 1 week's worth of medicine, and it is time to come in for a prescription refill. If you do this correctly, it is unlikely that you will ever run out of medicine.  To make sure that the above recommendation works, it is very important that you make sure your medication refill appointments are scheduled at least 1 week before you run out of medicine. To do this in an effective manner, make sure that you do not leave the office without scheduling your next medication management appointment. Always ask the nursing staff to show you in your prescription , when your medication will be running out. Then arrange for the  receptionist to get you a return   appointment, at least 7 days before you run out of medicine. Do not wait until you have 1 or 2 pills left, to come in. This is very poor planning and does not take into consideration that we may need to cancel appointments due to bad weather, sickness, or emergencies affecting our staff.  Prescription refills and/or changes in medication(s):   Prescription refills, and/or changes in dose or medication, will be conducted only during scheduled medication management appointments. (Applies to both, written and electronic prescriptions.)  No refills on procedure days. No medication will be changed or started on procedure days. No changes, adjustments, and/or refills will be conducted on a procedure day. Doing so will interfere with the diagnostic portion of the procedure.  No phone refills. No medications will be "called into the pharmacy".  No Fax refills.  No weekend refills.  No Holliday refills.  No after hours refills.  Remember:  Business hours are:  Monday to Thursday 8:00 AM to 4:00 PM Provider's Schedule: Crystal King, NP - Appointments are:  Medication management: Monday to Thursday 8:00 AM to 4:00 PM Parnell Spieler, MD - Appointments are:  Medication management: Monday and Wednesday 8:00 AM to 4:00 PM Procedure day: Tuesday and Thursday 7:30 AM to 4:00 PM Bilal Lateef, MD - Appointments are:  Medication management: Tuesday and Thursday 8:00 AM to 4:00 PM Procedure day: Monday and Wednesday 7:30 AM to 4:00 PM (Last update: 04/05/2017) ____________________________________________________________________________________________   ____________________________________________________________________________________________  CANNABIDIOL (AKA: CBD Oil or Pills)  Applies to: All patients receiving prescriptions of controlled substances (written and/or electronic).  General Information: Cannabidiol (CBD) was discovered in 1940. It is one of  some 113 identified cannabinoids in cannabis (Marijuana) plants, accounting for up to 40% of the plant's extract. As of 2018, preliminary clinical research on cannabidiol included studies of anxiety, cognition, movement disorders, and pain.  Cannabidiol is consummed in multiple ways, including inhalation of cannabis smoke or vapor, as an aerosol spray into the cheek, and by mouth. It may be supplied as CBD oil containing CBD as the active ingredient (no added tetrahydrocannabinol (THC) or terpenes), a full-plant CBD-dominant hemp extract oil, capsules, dried cannabis, or as a liquid solution. CBD is thought not have the same psychoactivity as THC, and may affect the actions of THC. Studies suggest that CBD may interact with different biological targets, including cannabinoid receptors and other neurotransmitter receptors. As of 2018 the mechanism of action for its biological effects has not been determined.  In the [place], cannabidiol has a limited approval by the Food and Drug Administration (FDA) for treatment of only two types of epilepsy disorders. The side effects of long-term use of the drug include somnolence, decreased appetite, diarrhea, fatigue, malaise, weakness, sleeping problems, and others.  CBD remains a Schedule I drug prohibited for any use.  Legality: Some manufacturers ship CBD products nationally, an illegal action which the FDA has not enforced in 2018, with CBD remaining the subject of an FDA investigational new drug evaluation, and is not considered legal as a dietary supplement or food ingredient as of December 2018. Federal illegality has made it difficult historically to conduct research on CBD. CBD is openly sold in head shops and health food stores in some states where such sales have not been explicitly legalized.  Warning: Because it is not FDA approved for general use or treatment of pain, it is not required to undergo the same manufacturing controls as prescription  drugs.  This means that the available cannabidiol (CBD) may   be contaminated with THC.  If this is the case, it will trigger a positive urine drug screen (UDS) test for cannabinoids (Marijuana).  Because a positive UDS for illicit substances is a violation of our medication agreement, your opioid analgesics (pain medicine) may be permanently discontinued. (Last update: 04/26/2017) ____________________________________________________________________________________________   ____________________________________________________________________________________________  Pain Scale  Introduction: The pain score used by this practice is the Verbal Numerical Rating Scale (VNRS-11). This is an 11-point scale. It is for adults and children 10 years or older. There are significant differences in how the pain score is reported, used, and applied. Forget everything you learned in the past and learn this scoring system.  General Information: The scale should reflect your current level of pain. Unless you are specifically asked for the level of your worst pain, or your average pain. If you are asked for one of these two, then it should be understood that it is over the past 24 hours.  Basic Activities of Daily Living (ADL): Personal hygiene, dressing, eating, transferring, and using restroom.  Instructions: Most patients tend to report their level of pain as a combination of two factors, their physical pain and their psychosocial pain. This last one is also known as "suffering" and it is reflection of how physical pain affects you socially and psychologically. From now on, report them separately. From this point on, when asked to report your pain level, report only your physical pain. Use the following table for reference.  Pain Clinic Pain Levels (0-5/10)  Pain Level Score  Description  No Pain 0   Mild pain 1 Nagging, annoying, but does not interfere with basic activities of daily living (ADL). Patients are  able to eat, bathe, get dressed, toileting (being able to get on and off the toilet and perform personal hygiene functions), transfer (move in and out of bed or a chair without assistance), and maintain continence (able to control bladder and bowel functions). Blood pressure and heart rate are unaffected. A normal heart rate for a healthy adult ranges from 60 to 100 bpm (beats per minute).   Mild to moderate pain 2 Noticeable and distracting. Impossible to hide from other people. More frequent flare-ups. Still possible to adapt and function close to normal. It can be very annoying and may have occasional stronger flare-ups. With discipline, patients may get used to it and adapt.   Moderate pain 3 Interferes significantly with activities of daily living (ADL). It becomes difficult to feed, bathe, get dressed, get on and off the toilet or to perform personal hygiene functions. Difficult to get in and out of bed or a chair without assistance. Very distracting. With effort, it can be ignored when deeply involved in activities.   Moderately severe pain 4 Impossible to ignore for more than a few minutes. With effort, patients may still be able to manage work or participate in some social activities. Very difficult to concentrate. Signs of autonomic nervous system discharge are evident: dilated pupils (mydriasis); mild sweating (diaphoresis); sleep interference. Heart rate becomes elevated (>115 bpm). Diastolic blood pressure (lower number) rises above 100 mmHg. Patients find relief in laying down and not moving.   Severe pain 5 Intense and extremely unpleasant. Associated with frowning face and frequent crying. Pain overwhelms the senses.  Ability to do any activity or maintain social relationships becomes significantly limited. Conversation becomes difficult. Pacing back and forth is common, as getting into a comfortable position is nearly impossible. Pain wakes you up from deep sleep. Physical signs will   be  obvious: pupillary dilation; increased sweating; goosebumps; brisk reflexes; cold, clammy hands and feet; nausea, vomiting or dry heaves; loss of appetite; significant sleep disturbance with inability to fall asleep or to remain asleep. When persistent, significant weight loss is observed due to the complete loss of appetite and sleep deprivation.  Blood pressure and heart rate becomes significantly elevated. Caution: If elevated blood pressure triggers a pounding headache associated with blurred vision, then the patient should immediately seek attention at an urgent or emergency care unit, as these may be signs of an impending stroke.    Emergency Department Pain Levels (6-10/10)  Emergency Room Pain 6 Severely limiting. Requires emergency care and should not be seen or managed at an outpatient pain management facility. Communication becomes difficult and requires great effort. Assistance to reach the emergency department may be required. Facial flushing and profuse sweating along with potentially dangerous increases in heart rate and blood pressure will be evident.   Distressing pain 7 Self-care is very difficult. Assistance is required to transport, or use restroom. Assistance to reach the emergency department will be required. Tasks requiring coordination, such as bathing and getting dressed become very difficult.   Disabling pain 8 Self-care is no longer possible. At this level, pain is disabling. The individual is unable to do even the most "basic" activities such as walking, eating, bathing, dressing, transferring to a bed, or toileting. Fine motor skills are lost. It is difficult to think clearly.   Incapacitating pain 9 Pain becomes incapacitating. Thought processing is no longer possible. Difficult to remember your own name. Control of movement and coordination are lost.   The worst pain imaginable 10 At this level, most patients pass out from pain. When this level is reached, collapse of the  autonomic nervous system occurs, leading to a sudden drop in blood pressure and heart rate. This in turn results in a temporary and dramatic drop in blood flow to the brain, leading to a loss of consciousness. Fainting is one of the body's self defense mechanisms. Passing out puts the brain in a calmed state and causes it to shut down for a while, in order to begin the healing process.    Summary: 1. Refer to this scale when providing us with your pain level. 2. Be accurate and careful when reporting your pain level. This will help with your care. 3. Over-reporting your pain level will lead to loss of credibility. 4. Even a level of 1/10 means that there is pain and will be treated at our facility. 5. High, inaccurate reporting will be documented as "Symptom Exaggeration", leading to loss of credibility and suspicions of possible secondary gains such as obtaining more narcotics, or wanting to appear disabled, for fraudulent reasons. 6. Only pain levels of 5 or below will be seen at our facility. 7. Pain levels of 6 and above will be sent to the Emergency Department and the appointment cancelled. ____________________________________________________________________________________________    

## 2017-08-07 ENCOUNTER — Other Ambulatory Visit: Payer: Self-pay

## 2017-08-07 ENCOUNTER — Encounter: Payer: Self-pay | Admitting: Nurse Practitioner

## 2017-08-07 ENCOUNTER — Ambulatory Visit: Payer: Worker's Compensation | Attending: Nurse Practitioner | Admitting: Nurse Practitioner

## 2017-08-07 VITALS — BP 176/85 | HR 68 | Temp 98.0°F | Resp 16 | Ht 72.0 in | Wt 186.0 lb

## 2017-08-07 DIAGNOSIS — M79605 Pain in left leg: Secondary | ICD-10-CM

## 2017-08-07 DIAGNOSIS — E119 Type 2 diabetes mellitus without complications: Secondary | ICD-10-CM | POA: Insufficient documentation

## 2017-08-07 DIAGNOSIS — K219 Gastro-esophageal reflux disease without esophagitis: Secondary | ICD-10-CM | POA: Insufficient documentation

## 2017-08-07 DIAGNOSIS — Z79899 Other long term (current) drug therapy: Secondary | ICD-10-CM | POA: Insufficient documentation

## 2017-08-07 DIAGNOSIS — F419 Anxiety disorder, unspecified: Secondary | ICD-10-CM | POA: Insufficient documentation

## 2017-08-07 DIAGNOSIS — G8929 Other chronic pain: Secondary | ICD-10-CM

## 2017-08-07 DIAGNOSIS — Z5181 Encounter for therapeutic drug level monitoring: Secondary | ICD-10-CM | POA: Diagnosis present

## 2017-08-07 DIAGNOSIS — F17290 Nicotine dependence, other tobacco product, uncomplicated: Secondary | ICD-10-CM | POA: Insufficient documentation

## 2017-08-07 DIAGNOSIS — M5442 Lumbago with sciatica, left side: Secondary | ICD-10-CM | POA: Diagnosis not present

## 2017-08-07 DIAGNOSIS — M21372 Foot drop, left foot: Secondary | ICD-10-CM | POA: Diagnosis not present

## 2017-08-07 DIAGNOSIS — Z7982 Long term (current) use of aspirin: Secondary | ICD-10-CM | POA: Diagnosis not present

## 2017-08-07 DIAGNOSIS — F329 Major depressive disorder, single episode, unspecified: Secondary | ICD-10-CM | POA: Diagnosis not present

## 2017-08-07 DIAGNOSIS — M5136 Other intervertebral disc degeneration, lumbar region: Secondary | ICD-10-CM | POA: Diagnosis not present

## 2017-08-07 DIAGNOSIS — Z882 Allergy status to sulfonamides status: Secondary | ICD-10-CM | POA: Diagnosis not present

## 2017-08-07 DIAGNOSIS — G894 Chronic pain syndrome: Secondary | ICD-10-CM | POA: Insufficient documentation

## 2017-08-07 DIAGNOSIS — Z886 Allergy status to analgesic agent status: Secondary | ICD-10-CM | POA: Diagnosis not present

## 2017-08-07 DIAGNOSIS — E039 Hypothyroidism, unspecified: Secondary | ICD-10-CM | POA: Insufficient documentation

## 2017-08-07 DIAGNOSIS — M5417 Radiculopathy, lumbosacral region: Secondary | ICD-10-CM | POA: Diagnosis not present

## 2017-08-07 DIAGNOSIS — Z7984 Long term (current) use of oral hypoglycemic drugs: Secondary | ICD-10-CM | POA: Diagnosis not present

## 2017-08-07 MED ORDER — OXYCODONE HCL 15 MG PO TABS: 15.0000 mg | ORAL_TABLET | Freq: Four times a day (QID) | ORAL | 0 refills | Status: DC | PRN

## 2017-08-07 MED ORDER — NALOXONE HCL 4 MG/0.1ML NA LIQD: 0.4000 mg | Freq: Once | NASAL | 0 refills | Status: DC

## 2017-08-07 NOTE — Progress Notes (Signed)
Patient's Name: Chase Mendez  MRN: [id number]  Referring Provider: Jodi Marble, MD  DOB: 03-10-1934  PCP: Jodi Marble, MD  DOS: 08/07/2017  Note by: Vevelyn Francois NP  Service setting: Ambulatory outpatient  Specialty: Interventional Pain Management  Location: [facility] (AMB) Pain Management Facility    Patient type: Established    Primary Reason(s) for Visit: Encounter for prescription drug management. (Level of risk: moderate)  CC: Back Pain (lower)  HPI  Mr. Liebler is a 83 y.o. year old, male patient, who comes today for a medication management evaluation. He has Spinal accessory neuropathy; Varicose veins of both lower extremities with pain; Lower extremity pain, bilateral; Venous reflux; Lower limb pain, inferior, left (L5) (Foot Drop); Chronic low back pain (2) (Bilateral) (ML) (L>R) with sciatica (Left); Chronic lower extremity pain (1) (Left); Failed back surgical syndrome; Foot drop (Left); Lumbosacral radiculopathy at L5; Disorder of skeletal system; Pharmacologic therapy; Problems influencing health status; Opioid Drug tolerance; History of acute renal failure (12/08/2014); History of encephalopathy (12/08/2014); History of sepsis (12/08/2014); DDD (degenerative disc disease), lumbar; and Chronic pain syndrome on their problem list. His primarily concern today is the Back Pain (lower)  Pain Assessment: Location: Lower Back Radiating: radiates down back of left leg on the side to outside of foot Onset: More than a month ago Duration: Chronic pain Quality: Constant, Cramping, Sharp Severity: 4 /10 (subjective, self-reported pain score)  Note: Reported level is compatible with observation.                          Effect on ADL: prolong walking, standing Timing: Constant Modifying factors: PT, lying down BP: (!) 176/85  HR: 68  Mr. Dougal was last scheduled for an appointment on Visit date not found for medication management. During today's appointment we reviewed Mr.  Keadle chronic pain status, as well as his outpatient medication regimen. He continues to have balance problems. He states that he continues to have weakness. He has left foot drop. He admits that he has a brace. He admits that he he is currently in PT and that it has been extended for 3 weeks.  He states that he has completed the psych evaluation for the [facility].  He is going to talk with a friend and then he would like to know out of pocket cost. He is leaning to having this placed.    The patient  reports that he does not use drugs. His body mass index is 25.23 kg/m.  Further details on both, my assessment(s), as well as the proposed treatment plan, please see below.  Controlled Substance Pharmacotherapy Assessment REMS (Risk Evaluation and Mitigation Strategy)  Analgesic: Oxycodone IR 15 mg p.o. 4 times daily MME/day:36m/day  GIgnatius Specking RN  08/07/2017  9:07 AM  Sign at close encounter Nursing Pain Medication Assessment:  Safety precautions to be maintained throughout the outpatient stay will include: orient to surroundings, keep bed in low position, maintain call bell within reach at all times, provide assistance with transfer out of bed and ambulation.  Medication Inspection Compliance: Pill count conducted under aseptic conditions, in front of the patient. Neither the pills nor the bottle was removed from the patient's sight at any time. Once count was completed pills were immediately returned to the patient in their original bottle.  Medication: See above Pill/Patch Count: 70 of 120 pills remain Pill/Patch Appearance: Markings consistent with prescribed medication Bottle Appearance: Standard pharmacy container. Clearly labeled. Filled  Date: 6 / 71 / 2019 Last Medication intake:  Today   Pharmacokinetics: Liberation and absorption (onset of action): WNL Distribution (time to peak effect): WNL Metabolism and excretion (duration of action): WNL          Pharmacodynamics: Desired effects: Analgesia: Mr. Symonette reports >50% benefit. Functional ability: Patient reports that medication allows him to accomplish basic ADLs Clinically meaningful improvement in function (CMIF): Sustained CMIF goals met Perceived effectiveness: Described as relatively effective, allowing for increase in activities of daily living (ADL) Undesirable effects: Side-effects or Adverse reactions: None reported Monitoring: [place] PMP: Online review of the past 59-monthperiod conducted. Compliant with practice rules and regulations Last UDS on record: No results found for: SUMMARY UDS interpretation: Compliant          Medication Assessment Form: Reviewed. Patient indicates being compliant with therapy Treatment compliance: Compliant Risk Assessment Profile: Aberrant behavior: See prior evaluations. None observed or detected today Comorbid factors increasing risk of overdose: See prior notes. No additional risks detected today Risk of substance use disorder (SUD): Low Opioid Risk Tool - 08/07/17 0903      Family History of Substance Abuse   Alcohol  Negative    Illegal Drugs  Negative    Rx Drugs  Negative      Personal History of Substance Abuse   Alcohol  Positive Male or Male    Illegal Drugs  Negative    Rx Drugs  Negative      Age   Age between 121-45years   Yes      History of Preadolescent Sexual Abuse   History of Preadolescent Sexual Abuse  Negative or Male      Psychological Disease   Psychological Disease  Negative    Depression  Negative      Total Score   Opioid Risk Tool Scoring  4    Opioid Risk Interpretation  Moderate Risk      ORT Scoring interpretation table:  Score <3 = Low Risk for SUD  Score between 4-7 = Moderate Risk for SUD  Score >8 = High Risk for Opioid Abuse   Risk Mitigation Strategies:  Patient Counseling: Covered Patient-Prescriber Agreement (PPA): Present and active  Notification to other healthcare providers:  Done  Pharmacologic Plan: No change in therapy, at this time.             Laboratory Chemistry  Inflammation Markers (CRP: Acute Phase) (ESR: Chronic Phase) Lab Results  Component Value Date   LATICACIDVEN 1.1 12/08/2014                         Rheumatology Markers No results found for: RF, ANA, LABURIC, URICUR, LYMEIGGIGMAB, LYMEABIGMQN, HLAB27                      Renal Function Markers Lab Results  Component Value Date   BUN 16 01/25/2017   CREATININE 0.98 01/25/2017   GFRAA >60 01/25/2017   GFRNONAA >60 01/25/2017                             Hepatic Function Markers Lab Results  Component Value Date   AST 28 08/21/2016   ALT 18 08/21/2016   ALBUMIN 4.1 08/21/2016   ALKPHOS 112 08/21/2016   LIPASE 37 08/21/2016  Electrolytes Lab Results  Component Value Date   NA 137 01/25/2017   K 3.2 (L) 01/25/2017   CL 99 (L) 01/25/2017   CALCIUM 9.2 01/25/2017                        Neuropathy Markers Lab Results  Component Value Date   HGBA1C 6.3 (H) 12/08/2014                        Bone Pathology Markers No results found for: [place], [id number], [id number], [id number], 25OHVITD1, 25OHVITD2, 25OHVITD3, TESTOFREE, TESTOSTERONE                       Coagulation Parameters Lab Results  Component Value Date   PLT 169 01/25/2017                        Cardiovascular Markers Lab Results  Component Value Date   TROPONINI <0.03 01/25/2017   HGB 16.6 01/25/2017   HCT 47.0 01/25/2017                         CA Markers No results found for: CEA, CA125, LABCA2                      Note: Lab results reviewed.  Recent Diagnostic Imaging Results  DG C-Arm 1-60 Min-No Report Fluoroscopy was utilized by the requesting physician.  No radiographic  interpretation.   Complexity Note: Imaging results reviewed. Results shared with Mr. Lafauci, using Layman's terms.                         Meds   Current Outpatient Medications:  .  allopurinol  (ZYLOPRIM) 100 MG tablet, Take 100 mg by mouth daily., Disp: , Rfl: 0 .  aspirin EC 81 MG tablet, Take 81 mg by mouth daily., Disp: , Rfl:  .  atorvastatin (LIPITOR) 10 MG tablet, Take 10 mg by mouth every evening., Disp: , Rfl: 0 .  benzonatate (TESSALON) 100 MG capsule, take 1 capsule by mouth three times a day, Disp: , Rfl: 0 .  citalopram (CELEXA) 20 MG tablet, Take 20 mg by mouth daily., Disp: , Rfl:  .  Cyanocobalamin 1000 MCG/ML LIQD, Take 1,000 mg by mouth daily., Disp: , Rfl:  .  diphenhydrAMINE (BENADRYL) 25 MG tablet, Take 25 mg by mouth every 6 (six) hours as needed., Disp: , Rfl:  .  docusate sodium (COLACE) 50 MG capsule, Take 50 mg by mouth 2 (two) times daily., Disp: , Rfl:  .  fluticasone (FLONASE) 50 MCG/ACT nasal spray, fluticasone propionate 50 mcg/act susp, Disp: , Rfl:  .  gabapentin (NEURONTIN) 600 MG tablet, Take 600 mg by mouth 4 (four) times daily., Disp: , Rfl: 0 .  isosorbide dinitrate (ISORDIL) 30 MG tablet, Take 30 mg by mouth 3 (three) times daily., Disp: , Rfl:  .  levothyroxine (SYNTHROID, LEVOTHROID) 150 MCG tablet, Take 150 mcg by mouth every morning., Disp: , Rfl: 0 .  linaclotide (LINZESS) 290 MCG CAPS capsule, Take 290 mcg by mouth daily before breakfast., Disp: , Rfl:  .  losartan (COZAAR) 50 MG tablet, Take 50 mg by mouth daily., Disp: , Rfl:  .  metFORMIN (GLUCOPHAGE) 500 MG tablet, Take 500 mg by mouth every evening., Disp: , Rfl: 0 .  mirabegron ER (MYRBETRIQ) 25  MG TB24 tablet, Take 25 mg by mouth daily., Disp: , Rfl:  .  omeprazole (PRILOSEC) 20 MG capsule, Take 20 mg by mouth daily., Disp: , Rfl:  .  ondansetron (ZOFRAN ODT) 4 MG disintegrating tablet, Take 1 tablet (4 mg total) by mouth every 8 (eight) hours as needed for nausea or vomiting., Disp: 20 tablet, Rfl: 0 .  [START ON 08/24/2017] oxyCODONE (ROXICODONE) 15 MG immediate release tablet, Take 1 tablet (15 mg total) by mouth every 6 (six) hours as needed for pain., Disp: 120 tablet, Rfl: 0 .   sucralfate (CARAFATE) 1 g tablet, Take 1 g by mouth 4 (four) times daily -  with meals and at bedtime., Disp: , Rfl:  .  testosterone (ANDROGEL) 50 MG/5GM (1%) GEL, Place onto the skin daily., Disp: , Rfl:  .  vitamin E 400 UNIT capsule, Take 400 Units by mouth daily., Disp: , Rfl:  .  naloxone (NARCAN) nasal spray 4 mg/0.1 mL, Place 0.1 sprays (0.4 mg total) into the nose once for 1 dose. Spray into one nostril. Repeat with second device into other nostril after 2-3 minutes if no or minimal response. Use in case of opioid overdose., Disp: 0.4 mg, Rfl: 0  ROS  Constitutional: Denies any fever or chills Gastrointestinal: No reported hemesis, hematochezia, vomiting, or acute GI distress Musculoskeletal: Denies any acute onset joint swelling, redness, loss of ROM, or weakness Neurological: No reported episodes of acute onset apraxia, aphasia, dysarthria, agnosia, amnesia, paralysis, loss of coordination, or loss of consciousness  Allergies  Mr. Splawn is allergic to ibuprofen; sulfa antibiotics; sulfasalazine; and lidocaine.  [place]  Drug: Mr. Lumpkin  reports that he does not use drugs. Alcohol:  reports that he does not drink alcohol. Tobacco:  reports that he has been smoking cigars.  He has never used smokeless tobacco. Medical:  has a past medical history of Acute encephalopathy (12/08/2014), Anxiety, ARF (acute renal failure) ([place]) (12/08/2014), Back pain, Benign neoplasm of large bowel, Capsulitis, Chronic back pain, Depression, Diabetes mellitus without complication ([employer]), Dysphagia, Exostosis, GERD (gastroesophageal reflux disease), Gout, Hypothyroidism, Insomnia, Low testosterone, Microscopic hematuria, Pneumonia (12/07/2014), Pressure ulcer (12/09/2014), Sepsis ([place]) (12/08/2014), and Thyroid disease. Surgical: Mr. Kohrs  has a past surgical history that includes Cholecystectomy; Appendectomy; Back surgery; Eye surgery; and Esophagogastroduodenoscopy (egd) with propofol (N/A,  04/24/2017). Family: family history includes Diabetes in his father; Heart disease in his mother.  Constitutional Exam  General appearance: Well nourished, well developed, and well hydrated. In no apparent acute distress Vitals:   08/07/17 0852  BP: (!) 176/85  Pulse: 68  Resp: 16  Temp: 98 F (36.7 C)  SpO2: 97%  Weight: 186 lb (84.4 kg)  Height: 6' (1.829 m)   BMI Assessment: Estimated body mass index is 25.23 kg/m as calculated from the following:   Height as of this encounter: 6' (1.829 m).   Weight as of this encounter: 186 lb (84.4 kg). Psych/Mental status: Alert, oriented x 3 (person, place, & time)       Eyes: PERLA Respiratory: No evidence of acute respiratory distress   Lumbar Spine Area Exam  Skin & Axial Inspection: Well healed scar from previous spine surgery detected Alignment: Symmetrical Functional ROM: Unrestricted ROM       Stability: No instability detected Muscle Tone/Strength: Functionally intact. No obvious neuro-muscular anomalies detected. Sensory (Neurological): Unimpaired Palpation: No palpable anomalies       Provocative Tests: Lumbar Hyperextension/rotation test: deferred today      balance issues Lumbar quadrant test (  Kemp's test): deferred today       Lumbar Lateral bending test: deferred today       Patrick's Maneuver: deferred today                   FABER test: deferred today       Thigh-thrust test: deferred today       S-I compression test: deferred today       S-I distraction test: deferred today        Gait & Posture Assessment  Ambulation: Patient came in today in a wheel chair Gait: Cautious Posture: WNL   Lower Extremity Exam    Side: Right lower extremity  Side: Left lower extremity  Stability: No instability observed          Stability: No instability observed          Skin & Extremity Inspection: Pitting edema  Skin & Extremity Inspection: Pitting edema  Functional ROM: Unrestricted ROM                  Functional ROM:  Unrestricted ROM                  Muscle Tone/Strength: Functionally intact. No obvious neuro-muscular anomalies detected.  Muscle Tone/Strength: Functionally intact. No obvious neuro-muscular anomalies detected.  Sensory (Neurological): Unimpaired  Sensory (Neurological): Unimpaired  Palpation: Non-tender  Palpation: Non-tender   Assessment  Primary Diagnosis & Pertinent Problem List: The primary encounter diagnosis was Chronic low back pain (2) (Bilateral) (ML) (L>R) with sciatica (Left). Diagnoses of Lumbosacral radiculopathy at L5, Lower limb pain, inferior, left (L5) (Foot Drop), and Chronic pain syndrome were also pertinent to this visit.  Status Diagnosis  Controlled Controlled Controlled 1. Chronic low back pain (2) (Bilateral) (ML) (L>R) with sciatica (Left)   2. Lumbosacral radiculopathy at L5   3. Lower limb pain, inferior, left (L5) (Foot Drop)   4. Chronic pain syndrome     Problems updated and reviewed during this visit: No problems updated. Plan of Care  Pharmacotherapy (Medications Ordered): Meds ordered this encounter  Medications  . oxyCODONE (ROXICODONE) 15 MG immediate release tablet    Sig: Take 1 tablet (15 mg total) by mouth every 6 (six) hours as needed for pain.    Dispense:  120 tablet    Refill:  0    Do not place this medication, or any other prescription from our practice, on "Automatic Refill". Patient may have prescription filled one day early if pharmacy is closed on scheduled refill date. Do not fill until: 08/24/2017 To last until: 09/23/2017    Order Specific Question:   Supervising Provider    Answer:   Milinda Pointer [phone]  . naloxone (NARCAN) nasal spray 4 mg/0.1 mL    Sig: Place 0.1 sprays (0.4 mg total) into the nose once for 1 dose. Spray into one nostril. Repeat with second device into other nostril after 2-3 minutes if no or minimal response. Use in case of opioid overdose.    Dispense:  0.4 mg    Refill:  0    Narcan Nasal Spray.  (2 pack) Please provide the patient with clear instructions on the use of this device/medication.    Order Specific Question:   Supervising Provider    Answer:   Milinda Pointer [[id number]]   New Prescriptions   NALOXONE (NARCAN) NASAL SPRAY 4 MG/0.1 ML    Place 0.1 sprays (0.4 mg total) into the nose once for 1 dose. Spray into one  nostril. Repeat with second device into other nostril after 2-3 minutes if no or minimal response. Use in case of opioid overdose.   Medications administered today: Evert Kohl. [place] had no medications administered during this visit. Lab-work, procedure(s), and/or referral(s): No orders of the defined types were placed in this encounter.  Imaging and/or referral(s): None  Interventional management options: Planned, scheduled, and/or pending:   Not at this time.    Considering:   Diagnostic caudal epidural steroid injection + epidurogram Possible Racz procedure Diagnostic bilateral lumbar facet block Possible bilateral lumbar facet RFA Possible candidate for intrathecal pump trial and implant Possible candidate for spinal cord stimulator trial and implant   Palliative PRN treatment(s):   Diagnostic left-sided L4 and [id number]       Provider-requested follow-up: Return in about 1 month (around 09/04/2017) for MedMgmt with Me Donella Stade Edison Pace).  Future Appointments  Date Time Provider [place]  09/04/2017  9:30 AM Vevelyn Francois, NP [facility] None   Primary Care Physician: Jodi Marble, MD Location: [facility] Management Facility Note by: Vevelyn Francois NP Date: 08/07/2017; Time: 12:57 PM  Pain Score Disclaimer: We use the NRS-11 scale. This is a self-reported, subjective measurement of pain severity with only modest accuracy. It is used primarily to identify changes within a particular patient. It must be understood that outpatient pain scales are significantly less accurate that those used for research, where they  can be applied under ideal controlled circumstances with minimal exposure to variables. In reality, the score is likely to be a combination of pain intensity and pain affect, where pain affect describes the degree of emotional arousal or changes in action readiness caused by the sensory experience of pain. Factors such as social and work situation, setting, emotional state, anxiety levels, expectation, and prior pain experience may influence pain perception and show large inter-individual differences that may also be affected by time variables.  Patient instructions provided during this appointment: Patient Instructions  ____________________________________________________________________________________________  Medication Rules  Applies to: All patients receiving prescriptions (written or electronic).  Pharmacy of record: Pharmacy where electronic prescriptions will be sent. If written prescriptions are taken to a different pharmacy, please inform the nursing staff. The pharmacy listed in the electronic medical record should be the one where you would like electronic prescriptions to be sent.  Prescription refills: Only during scheduled appointments. Applies to both, written and electronic prescriptions.  NOTE: The following applies primarily to controlled substances (Opioid* Pain Medications).   Patient's responsibilities: 1. Pain Pills: Bring all pain pills to every appointment (except for procedure appointments). 2. Pill Bottles: Bring pills in original pharmacy bottle. Always bring newest bottle. Bring bottle, even if empty. 3. Medication refills: You are responsible for knowing and keeping track of what medications you need refilled. The day before your appointment, write a list of all prescriptions that need to be refilled. Bring that list to your appointment and give it to the admitting nurse. Prescriptions will be written only during appointments. If you forget a medication, it will not  be "Called in", "Faxed", or "electronically sent". You will need to get another appointment to get these prescribed. 4. Prescription Accuracy: You are responsible for carefully inspecting your prescriptions before leaving our office. Have the discharge nurse carefully go over each prescription with you, before taking them home. Make sure that your name is accurately spelled, that your address is correct. Check the name and dose of your medication to make sure it is accurate. Check the  number of pills, and the written instructions to make sure they are clear and accurate. Make sure that you are given enough medication to last until your next medication refill appointment. 5. Taking Medication: Take medication as prescribed. Never take more pills than instructed. Never take medication more frequently than prescribed. Taking less pills or less frequently is permitted and encouraged, when it comes to controlled substances (written prescriptions).  6. Inform other Doctors: Always inform, all of your healthcare providers, of all the medications you take. 7. Pain Medication from other Providers: You are not allowed to accept any additional pain medication from any other Doctor or Healthcare provider. There are two exceptions to this rule. (see below) In the event that you require additional pain medication, you are responsible for notifying us, as stated below. 8. Medication Agreement: You are responsible for carefully reading and following our Medication Agreement. This must be signed before receiving any prescriptions from our practice. Safely store a copy of your signed Agreement. Violations to the Agreement will result in no further prescriptions. (Additional copies of our Medication Agreement are available upon request.) 9. Laws, Rules, & Regulations: All patients are expected to follow all Federal and [employer], Rules, [employer]. Ignorance of the Laws does not constitute a valid excuse. The use  of any illegal substances is prohibited. 10. Adopted CDC guidelines & recommendations: Target dosing levels will be at or below 60 MME/day. Use of benzodiazepines** is not recommended.  Exceptions: There are only two exceptions to the rule of not receiving pain medications from other Healthcare Providers. 1. Exception #1 (Emergencies): In the event of an emergency (i.e.: accident requiring emergency care), you are allowed to receive additional pain medication. However, you are responsible for: As soon as you are able, call our office [phone], at any time of the day or night, and leave a message stating your name, the date and nature of the emergency, and the name and dose of the medication prescribed. In the event that your call is answered by a member of our staff, make sure to document and save the date, time, and the name of the person that took your information.  2. Exception #2 (Planned Surgery): In the event that you are scheduled by another doctor or dentist to have any type of surgery or procedure, you are allowed (for a period no longer than 30 days), to receive additional pain medication, for the acute post-op pain. However, in this case, you are responsible for picking up a copy of our "Post-op Pain Management for Surgeons" handout, and giving it to your surgeon or dentist. This document is available at our office, and does not require an appointment to obtain it. Simply go to our office during business hours (Monday-Thursday from 8:00 AM to 4:00 PM) (Friday 8:00 AM to 12:00 Noon) or if you have a scheduled appointment with [place], prior to your surgery, and ask for it by name. In addition, you will need to provide [place] with your name, name of your surgeon, type of surgery, and date of procedure or surgery.  *Opioid medications include: morphine, codeine, oxycodone, oxymorphone, hydrocodone, hydromorphone, meperidine, tramadol, tapentadol, buprenorphine, fentanyl, methadone. **Benzodiazepine  medications include: diazepam (Valium), alprazolam (Xanax), clonazepam (Klonopine), lorazepam (Ativan), clorazepate (Tranxene), chlordiazepoxide (Librium), estazolam (Prosom), oxazepam (Serax), temazepam (Restoril), triazolam (Halcion) (Last updated: 04/05/2017) ____________________________________________________________________________________________

## 2017-08-07 NOTE — Progress Notes (Signed)
Nursing Pain Medication Assessment:  Safety precautions to be maintained throughout the outpatient stay will include: orient to surroundings, keep bed in low position, maintain call bell within reach at all times, provide assistance with transfer out of bed and ambulation.  Medication Inspection Compliance: Pill count conducted under aseptic conditions, in front of the patient. Neither the pills nor the bottle was removed from the patient's sight at any time. Once count was completed pills were immediately returned to the patient in their original bottle.  Medication: See above Pill/Patch Count: 70 of 120 pills remain Pill/Patch Appearance: Markings consistent with prescribed medication Bottle Appearance: Standard pharmacy container. Clearly labeled. Filled Date: 6 / 54 / 2019 Last Medication intake:  Today

## 2017-08-07 NOTE — Patient Instructions (Signed)
____________________________________________________________________________________________  Medication Rules  Applies to: All patients receiving prescriptions (written or electronic).  Pharmacy of record: Pharmacy where electronic prescriptions will be sent. If written prescriptions are taken to a different pharmacy, please inform the nursing staff. The pharmacy listed in the electronic medical record should be the one where you would like electronic prescriptions to be sent.  Prescription refills: Only during scheduled appointments. Applies to both, written and electronic prescriptions.  NOTE: The following applies primarily to controlled substances (Opioid* Pain Medications).   Patient's responsibilities: 1. Pain Pills: Bring all pain pills to every appointment (except for procedure appointments). 2. Pill Bottles: Bring pills in original pharmacy bottle. Always bring newest bottle. Bring bottle, even if empty. 3. Medication refills: You are responsible for knowing and keeping track of what medications you need refilled. The day before your appointment, write a list of all prescriptions that need to be refilled. Bring that list to your appointment and give it to the admitting nurse. Prescriptions will be written only during appointments. If you forget a medication, it will not be "Called in", "Faxed", or "electronically sent". You will need to get another appointment to get these prescribed. 4. Prescription Accuracy: You are responsible for carefully inspecting your prescriptions before leaving our office. Have the discharge nurse carefully go over each prescription with you, before taking them home. Make sure that your name is accurately spelled, that your address is correct. Check the name and dose of your medication to make sure it is accurate. Check the number of pills, and the written instructions to make sure they are clear and accurate. Make sure that you are given enough medication to last  until your next medication refill appointment. 5. Taking Medication: Take medication as prescribed. Never take more pills than instructed. Never take medication more frequently than prescribed. Taking less pills or less frequently is permitted and encouraged, when it comes to controlled substances (written prescriptions).  6. Inform other Doctors: Always inform, all of your healthcare providers, of all the medications you take. 7. Pain Medication from other Providers: You are not allowed to accept any additional pain medication from any other Doctor or Healthcare provider. There are two exceptions to this rule. (see below) In the event that you require additional pain medication, you are responsible for notifying us, as stated below. 8. Medication Agreement: You are responsible for carefully reading and following our Medication Agreement. This must be signed before receiving any prescriptions from our practice. Safely store a copy of your signed Agreement. Violations to the Agreement will result in no further prescriptions. (Additional copies of our Medication Agreement are available upon request.) 9. Laws, Rules, & Regulations: All patients are expected to follow all Federal and State Laws, Statutes, Rules, & Regulations. Ignorance of the Laws does not constitute a valid excuse. The use of any illegal substances is prohibited. 10. Adopted CDC guidelines & recommendations: Target dosing levels will be at or below 60 MME/day. Use of benzodiazepines** is not recommended.  Exceptions: There are only two exceptions to the rule of not receiving pain medications from other Healthcare Providers. 1. Exception #1 (Emergencies): In the event of an emergency (i.e.: accident requiring emergency care), you are allowed to receive additional pain medication. However, you are responsible for: As soon as you are able, call our office [phone], at any time of the day or night, and leave a message stating your name, the  date and nature of the emergency, and the name and dose of the medication   prescribed. In the event that your call is answered by a member of our staff, make sure to document and save the date, time, and the name of the person that took your information.  2. Exception #2 (Planned Surgery): In the event that you are scheduled by another doctor or dentist to have any type of surgery or procedure, you are allowed (for a period no longer than 30 days), to receive additional pain medication, for the acute post-op pain. However, in this case, you are responsible for picking up a copy of our "Post-op Pain Management for Surgeons" handout, and giving it to your surgeon or dentist. This document is available at our office, and does not require an appointment to obtain it. Simply go to our office during business hours (Monday-Thursday from 8:00 AM to 4:00 PM) (Friday 8:00 AM to 12:00 Noon) or if you have a scheduled appointment with us, prior to your surgery, and ask for it by name. In addition, you will need to provide us with your name, name of your surgeon, type of surgery, and date of procedure or surgery.  *Opioid medications include: morphine, codeine, oxycodone, oxymorphone, hydrocodone, hydromorphone, meperidine, tramadol, tapentadol, buprenorphine, fentanyl, methadone. **Benzodiazepine medications include: diazepam (Valium), alprazolam (Xanax), clonazepam (Klonopine), lorazepam (Ativan), clorazepate (Tranxene), chlordiazepoxide (Librium), estazolam (Prosom), oxazepam (Serax), temazepam (Restoril), triazolam (Halcion) (Last updated: 04/05/2017) ____________________________________________________________________________________________    

## 2017-09-04 ENCOUNTER — Other Ambulatory Visit: Payer: Self-pay

## 2017-09-04 ENCOUNTER — Ambulatory Visit: Payer: PRIVATE HEALTH INSURANCE | Attending: Nurse Practitioner | Admitting: Nurse Practitioner

## 2017-09-04 ENCOUNTER — Encounter: Payer: Self-pay | Admitting: Nurse Practitioner

## 2017-09-04 VITALS — BP 143/76 | HR 73 | Temp 97.8°F | Resp 16 | Ht 71.0 in | Wt 186.0 lb

## 2017-09-04 DIAGNOSIS — F329 Major depressive disorder, single episode, unspecified: Secondary | ICD-10-CM | POA: Insufficient documentation

## 2017-09-04 DIAGNOSIS — M5116 Intervertebral disc disorders with radiculopathy, lumbar region: Secondary | ICD-10-CM | POA: Diagnosis not present

## 2017-09-04 DIAGNOSIS — G894 Chronic pain syndrome: Secondary | ICD-10-CM

## 2017-09-04 DIAGNOSIS — Z886 Allergy status to analgesic agent status: Secondary | ICD-10-CM | POA: Diagnosis not present

## 2017-09-04 DIAGNOSIS — M79605 Pain in left leg: Secondary | ICD-10-CM | POA: Diagnosis not present

## 2017-09-04 DIAGNOSIS — K219 Gastro-esophageal reflux disease without esophagitis: Secondary | ICD-10-CM | POA: Insufficient documentation

## 2017-09-04 DIAGNOSIS — M961 Postlaminectomy syndrome, not elsewhere classified: Secondary | ICD-10-CM | POA: Insufficient documentation

## 2017-09-04 DIAGNOSIS — Z9049 Acquired absence of other specified parts of digestive tract: Secondary | ICD-10-CM | POA: Diagnosis not present

## 2017-09-04 DIAGNOSIS — M21372 Foot drop, left foot: Secondary | ICD-10-CM | POA: Diagnosis not present

## 2017-09-04 DIAGNOSIS — Z79890 Hormone replacement therapy (postmenopausal): Secondary | ICD-10-CM | POA: Insufficient documentation

## 2017-09-04 DIAGNOSIS — Z7951 Long term (current) use of inhaled steroids: Secondary | ICD-10-CM | POA: Insufficient documentation

## 2017-09-04 DIAGNOSIS — M545 Low back pain: Secondary | ICD-10-CM | POA: Diagnosis present

## 2017-09-04 DIAGNOSIS — F17290 Nicotine dependence, other tobacco product, uncomplicated: Secondary | ICD-10-CM | POA: Diagnosis not present

## 2017-09-04 DIAGNOSIS — Z79899 Other long term (current) drug therapy: Secondary | ICD-10-CM | POA: Insufficient documentation

## 2017-09-04 DIAGNOSIS — Z79891 Long term (current) use of opiate analgesic: Secondary | ICD-10-CM | POA: Insufficient documentation

## 2017-09-04 DIAGNOSIS — M109 Gout, unspecified: Secondary | ICD-10-CM | POA: Diagnosis not present

## 2017-09-04 DIAGNOSIS — G4700 Insomnia, unspecified: Secondary | ICD-10-CM | POA: Insufficient documentation

## 2017-09-04 DIAGNOSIS — F419 Anxiety disorder, unspecified: Secondary | ICD-10-CM | POA: Diagnosis not present

## 2017-09-04 DIAGNOSIS — Z884 Allergy status to anesthetic agent status: Secondary | ICD-10-CM | POA: Insufficient documentation

## 2017-09-04 DIAGNOSIS — E119 Type 2 diabetes mellitus without complications: Secondary | ICD-10-CM | POA: Insufficient documentation

## 2017-09-04 DIAGNOSIS — Z882 Allergy status to sulfonamides status: Secondary | ICD-10-CM | POA: Insufficient documentation

## 2017-09-04 DIAGNOSIS — M5417 Radiculopathy, lumbosacral region: Secondary | ICD-10-CM

## 2017-09-04 DIAGNOSIS — Z7984 Long term (current) use of oral hypoglycemic drugs: Secondary | ICD-10-CM | POA: Insufficient documentation

## 2017-09-04 DIAGNOSIS — E039 Hypothyroidism, unspecified: Secondary | ICD-10-CM | POA: Diagnosis not present

## 2017-09-04 DIAGNOSIS — M5442 Lumbago with sciatica, left side: Secondary | ICD-10-CM

## 2017-09-04 DIAGNOSIS — Z8249 Family history of ischemic heart disease and other diseases of the circulatory system: Secondary | ICD-10-CM | POA: Diagnosis not present

## 2017-09-04 DIAGNOSIS — Z833 Family history of diabetes mellitus: Secondary | ICD-10-CM | POA: Insufficient documentation

## 2017-09-04 DIAGNOSIS — G8929 Other chronic pain: Secondary | ICD-10-CM

## 2017-09-04 DIAGNOSIS — Z7982 Long term (current) use of aspirin: Secondary | ICD-10-CM | POA: Insufficient documentation

## 2017-09-04 MED ORDER — NALOXONE HCL 4 MG/0.1ML NA LIQD: NASAL | 0 refills | Status: DC

## 2017-09-04 MED ORDER — OXYCODONE HCL 15 MG PO TABS: 15.0000 mg | ORAL_TABLET | Freq: Four times a day (QID) | ORAL | 0 refills | Status: DC | PRN

## 2017-09-04 NOTE — Progress Notes (Signed)
Patient's Name: Chase Mendez  MRN: [id number]  Referring Provider: Jodi Marble, MD  DOB: [birth year]  PCP: Jodi Marble, MD  DOS: 09/04/2017  Note by: Vevelyn Francois NP  Service setting: Ambulatory outpatient  Specialty: Interventional Pain Management  Location: [facility] (AMB) Pain Management Facility    Patient type: Established    Primary Reason(s) for Visit: Encounter for prescription drug management. (Level of risk: moderate)  CC: Back Pain (lower)  HPI  Chase Mendez is a 83 y.o. year old, male patient, who comes today for a medication management evaluation. He has Spinal accessory neuropathy; Varicose veins of both lower extremities with pain; Lower extremity pain, bilateral; Venous reflux; Lower limb pain, inferior, left (L5) (Foot Drop); Chronic low back pain (2) (Bilateral) (ML) (L>R) with sciatica (Left); Chronic lower extremity pain (1) (Left); Failed back surgical syndrome; Foot drop (Left); Lumbosacral radiculopathy at L5; Disorder of skeletal system; Pharmacologic therapy; Problems influencing health status; Opioid Drug tolerance; History of acute renal failure (12/08/2014); History of encephalopathy (12/08/2014); History of sepsis (12/08/2014); DDD (degenerative disc disease), lumbar; and Chronic pain syndrome on their problem list. His primarily concern today is the Back Pain (lower)  Pain Assessment: Location: Lower Back Radiating: radiates down back of left leg on the side to the outside of foot Onset: More than a month ago Duration: Chronic pain Quality: Aching, Constant, Sharp Severity: 6 /10 (subjective, self-reported pain score)  Note: Reported level is compatible with observation. Clinically the patient looks like a 2/10 A 2/10 is viewed as "Mild to Moderate" and described as noticeable and distracting. Impossible to hide from other people. More frequent flare-ups. Still possible to adapt and function close to normal. It can be very annoying and may have occasional  stronger flare-ups. With discipline, patients may get used to it and adapt.       When using our objective Pain Scale, levels between 6 and 10/10 are said to belong in an emergency room, as it progressively worsens from a 6/10, described as severely limiting, requiring emergency care not usually available at an outpatient pain management facility. At a 6/10 level, communication becomes difficult and requires great effort. Assistance to reach the emergency department may be required. Facial flushing and profuse sweating along with potentially dangerous increases in heart rate and blood pressure will be evident. Effect on ADL: Prolonged walking or standing Timing:   Modifying factors: PT, lying down BP: (!) 143/76  HR: 73  Chase Mendez was last scheduled for an appointment on 08/07/2017 for medication management. During today's appointment we reviewed Chase Mendez chronic pain status, as well as his outpatient medication regimen. He suffered a fall one month ago. He is stumbling a lot however he is PT. He admits that this was the worse fall that he has had. He admits that he was evaluated by PCP and urology. He has some bruised ribs on the left. He did have his Gabapentin decreased from 600 mg to 472m.  He admits that he continues to have the left leg pain. He does not feel ike the fall made the back pain any worse despite hitting the left flank area.  His wife admits that he was active this weekend. He was able to go to church and sing in the choir. He admits that his left leg will grab. He admits that this is about twice daily. He admits that he can " walk it off" ; the pain.  He admits that he has not made up his  mine about the [place].  He does have constipation. He states that he uses the Linzess QOD and it is effective His wife asked about going to every three months follow up however the were going to [facility] in [place] monthly.    The patient  reports that he does not use drugs. His body mass index is  25.94 kg/m.  Further details on both, my assessment(s), as well as the proposed treatment plan, please see below.  Controlled Substance Pharmacotherapy Assessment REMS (Risk Evaluation and Mitigation Strategy)  Analgesic:Oxycodone IR 15 mg p.o. 4 times daily MME/day:88m/day   GIgnatius Specking RN  09/04/2017  9:57 AM  Sign at close encounter Nursing Pain Medication Assessment:  Safety precautions to be maintained throughout the outpatient stay will include: orient to surroundings, keep bed in low position, maintain call bell within reach at all times, provide assistance with transfer out of bed and ambulation.  Medication Inspection Compliance: Pill count conducted under aseptic conditions, in front of the patient. Neither the pills nor the bottle was removed from the patient's sight at any time. Once count was completed pills were immediately returned to the patient in their original bottle.  Medication: See above Pill/Patch Count: 77 of 120 pills remain Pill/Patch Appearance: Markings consistent with prescribed medication Bottle Appearance: Standard pharmacy container. Clearly labeled. Filled Date: 7 / 143/ 2019 Last Medication intake:  Today   Pharmacokinetics: Liberation and absorption (onset of action): WNL Distribution (time to peak effect): WNL Metabolism and excretion (duration of action): WNL         Pharmacodynamics: Desired effects: Analgesia: Mr. RSiegmannreports >50% benefit. Functional ability: Patient reports that medication allows him to accomplish basic ADLs Clinically meaningful improvement in function (CMIF): Sustained CMIF goals met Perceived effectiveness: Described as relatively effective, allowing for increase in activities of daily living (ADL) Undesirable effects: Side-effects or Adverse reactions: None reported Monitoring: [place] PMP: Online review of the past 129-montheriod conducted. Compliant with practice rules and regulations Last UDS on record: No  results found for: SUMMARY UDS interpretation: Compliant          Medication Assessment Form: Reviewed. Patient indicates being compliant with therapy Treatment compliance: Compliant Risk Assessment Profile: Aberrant behavior: See prior evaluations. None observed or detected today Comorbid factors increasing risk of overdose: See prior notes. No additional risks detected today Risk of substance use disorder (SUD): Moderate Opioid Risk Tool - 09/04/17 0954      Family History of Substance Abuse   Alcohol  Negative    Illegal Drugs  Negative    Rx Drugs  Negative      Personal History of Substance Abuse   Alcohol  Positive Male or Male    Illegal Drugs  Negative    Rx Drugs  Negative      Age   Age between 1633-45ears   No      History of Preadolescent Sexual Abuse   History of Preadolescent Sexual Abuse  Negative or Male      Psychological Disease   Psychological Disease  Positive    ADD  Negative    OCD  Negative    Bipolar  Negative    Schizophrenia  Negative    Depression  Positive      Total Score   Opioid Risk Tool Scoring  6    Opioid Risk Interpretation  Moderate Risk      ORT Scoring interpretation table:  Score <3 = Low Risk for SUD  Score between  4-7 = Moderate Risk for SUD  Score >8 = High Risk for Opioid Abuse   Risk Mitigation Strategies:  Patient Counseling: Covered Patient-Prescriber Agreement (PPA): Present and active  Notification to other healthcare providers: Done  Pharmacologic Plan: No change in therapy, at this time.             Laboratory Chemistry  Inflammation Markers (CRP: Acute Phase) (ESR: Chronic Phase) Lab Results  Component Value Date   LATICACIDVEN 1.1 12/08/2014                         Rheumatology Markers No results found for: RF, ANA, LABURIC, URICUR, LYMEIGGIGMAB, LYMEABIGMQN, HLAB27                      Renal Function Markers Lab Results  Component Value Date   BUN 16 01/25/2017   CREATININE 0.98 01/25/2017    GFRAA >60 01/25/2017   GFRNONAA >60 01/25/2017                             Hepatic Function Markers Lab Results  Component Value Date   AST 28 08/21/2016   ALT 18 08/21/2016   ALBUMIN 4.1 08/21/2016   ALKPHOS 112 08/21/2016   LIPASE 37 08/21/2016                        Electrolytes Lab Results  Component Value Date   NA 137 01/25/2017   K 3.2 (L) 01/25/2017   CL 99 (L) 01/25/2017   CALCIUM 9.2 01/25/2017                        Neuropathy Markers Lab Results  Component Value Date   HGBA1C 6.3 (H) 12/08/2014                        Bone Pathology Markers No results found for: Benson, [id number], [id number], [id number], 25OHVITD1, 25OHVITD2, 25OHVITD3, TESTOFREE, TESTOSTERONE                       Coagulation Parameters Lab Results  Component Value Date   PLT 169 01/25/2017                        Cardiovascular Markers Lab Results  Component Value Date   TROPONINI <0.03 01/25/2017   HGB 16.6 01/25/2017   HCT 47.0 01/25/2017                         CA Markers No results found for: CEA, CA125, LABCA2                      Note: Lab results reviewed.  Recent Diagnostic Imaging Results  DG C-Arm 1-60 Min-No Report Fluoroscopy was utilized by the requesting physician.  No radiographic  interpretation.   Complexity Note: Imaging results reviewed. Results shared with Mr. Rogerson, using Layman's terms.                         Meds   Current Outpatient Medications:  .  allopurinol (ZYLOPRIM) 100 MG tablet, Take 100 mg by mouth daily., Disp: , Rfl: 0 .  aspirin EC 81 MG tablet, Take 81 mg by mouth daily., Disp: , Rfl:  .  atorvastatin (LIPITOR) 10 MG tablet, Take 10 mg by mouth every evening., Disp: , Rfl: 0 .  benzonatate (TESSALON) 100 MG capsule, take 1 capsule by mouth three times a day, Disp: , Rfl: 0 .  citalopram (CELEXA) 20 MG tablet, Take 20 mg by mouth daily., Disp: , Rfl:  .  Cyanocobalamin 1000 MCG/ML LIQD, Take 1,000 mg by mouth daily., Disp: , Rfl:  .   diphenhydrAMINE (BENADRYL) 25 MG tablet, Take 25 mg by mouth every 6 (six) hours as needed., Disp: , Rfl:  .  fluticasone (FLONASE) 50 MCG/ACT nasal spray, fluticasone propionate 50 mcg/act susp, Disp: , Rfl:  .  gabapentin (NEURONTIN) 600 MG tablet, Take 400 mg by mouth 3 (three) times daily. , Disp: , Rfl: 0 .  isosorbide dinitrate (ISORDIL) 30 MG tablet, Take 30 mg by mouth 3 (three) times daily., Disp: , Rfl:  .  levothyroxine (SYNTHROID, LEVOTHROID) 150 MCG tablet, Take 150 mcg by mouth every morning., Disp: , Rfl: 0 .  linaclotide (LINZESS) 290 MCG CAPS capsule, Take 290 mcg by mouth daily before breakfast., Disp: , Rfl:  .  losartan (COZAAR) 50 MG tablet, Take 50 mg by mouth daily., Disp: , Rfl:  .  metFORMIN (GLUCOPHAGE) 500 MG tablet, Take 500 mg by mouth every evening., Disp: , Rfl: 0 .  mirabegron ER (MYRBETRIQ) 25 MG TB24 tablet, Take 25 mg by mouth daily., Disp: , Rfl:  .  omeprazole (PRILOSEC) 20 MG capsule, Take 20 mg by mouth daily., Disp: , Rfl:  .  ondansetron (ZOFRAN ODT) 4 MG disintegrating tablet, Take 1 tablet (4 mg total) by mouth every 8 (eight) hours as needed for nausea or vomiting., Disp: 20 tablet, Rfl: 0 .  [START ON 09/23/2017] oxyCODONE (ROXICODONE) 15 MG immediate release tablet, Take 1 tablet (15 mg total) by mouth every 6 (six) hours as needed for pain., Disp: 120 tablet, Rfl: 0 .  testosterone (ANDROGEL) 50 MG/5GM (1%) GEL, Place onto the skin daily., Disp: , Rfl:  .  vitamin E 400 UNIT capsule, Take 400 Units by mouth daily., Disp: , Rfl:  .  naloxone (NARCAN) nasal spray 4 mg/0.1 mL, Spray into one nostril. Repeat with second device into other nostril after 2-3 minutes if no or minimal response. Use in case of opioid overdose., Disp: 1 kit, Rfl: 0 .  [START ON 10/23/2017] oxyCODONE (ROXICODONE) 15 MG immediate release tablet, Take 1 tablet (15 mg total) by mouth every 6 (six) hours as needed for pain., Disp: 120 tablet, Rfl: 0  ROS  Constitutional: Denies any  fever or chills Gastrointestinal: No reported hemesis, hematochezia, vomiting, or acute GI distress Musculoskeletal: Denies any acute onset joint swelling, redness, loss of ROM, or weakness Neurological: No reported episodes of acute onset apraxia, aphasia, dysarthria, agnosia, amnesia, paralysis, loss of coordination, or loss of consciousness  Allergies  Mr. Mallozzi is allergic to ibuprofen; sulfa antibiotics; sulfasalazine; and lidocaine.  [place]  Drug: Mr. Kuhnert  reports that he does not use drugs. Alcohol:  reports that he does not drink alcohol. Tobacco:  reports that he has been smoking cigars.  He has never used smokeless tobacco. Medical:  has a past medical history of Acute encephalopathy (12/08/2014), Anxiety, ARF (acute renal failure) ([place]) (12/08/2014), Back pain, Benign neoplasm of large bowel, Capsulitis, Chronic back pain, Depression, Diabetes mellitus without complication ([place]), Dysphagia, Exostosis, GERD (gastroesophageal reflux disease), Gout, Hypothyroidism, Insomnia, Low testosterone, Microscopic hematuria, Pneumonia (12/07/2014), Pressure ulcer (12/09/2014), Sepsis ([place]) (12/08/2014), and Thyroid disease. Surgical: Mr. Mahany  has a past surgical history that includes Cholecystectomy; Appendectomy; Back surgery; Eye surgery; and Esophagogastroduodenoscopy (egd) with propofol (N/A, 04/24/2017). Family: family history includes Diabetes in his father; Heart disease in his mother.  Constitutional Exam  General appearance: Well nourished, well developed, and well hydrated. In no apparent acute distress Vitals:   09/04/17 0943  BP: (!) 143/76  Pulse: 73  Resp: 16  Temp: 97.8 F (36.6 C)  SpO2: 97%  Weight: 186 lb (84.4 kg)  Height: '5\' 11"'  (1.803 m)   BMI Assessment: Estimated body mass index is 25.94 kg/m as calculated from the following:   Height as of this encounter: '5\' 11"'  (1.803 m).   Weight as of this encounter: 186 lb (84.4 kg). Psych/Mental status: Alert, oriented x 3  (person, place, & time)       Eyes: PERLA Respiratory: No evidence of acute respiratory distress  Lumbar Spine Area Exam  Skin & Axial Inspection: No masses, redness, or swelling Alignment: Symmetrical Functional ROM: Unrestricted ROM       Stability: No instability detected Muscle Tone/Strength: Functionally intact. No obvious neuro-muscular anomalies detected. Sensory (Neurological): Unimpaired Palpation: Complains of area being tender to palpation       Provocative Tests: Lumbar Hyperextension/rotation test: deferred today       Lumbar quadrant test (Kemp's test): deferred today       Lumbar Lateral bending test: deferred today       Patrick's Maneuver: deferred today                   FABER test: deferred today                   Thigh-thrust test: deferred today       S-I compression test: deferred today       S-I distraction test: deferred today        Gait & Posture Assessment  Ambulation: Patient ambulates using a walker Gait: High stepping gait (foot drop) Posture: WNL   Lower Extremity Exam    Side: Right lower extremity  Side: Left lower extremity  Stability: No instability observed          Stability: No instability observed          Skin & Extremity Inspection: Skin color, temperature, and hair growth are WNL. No peripheral edema or cyanosis. No masses, redness, swelling, asymmetry, or associated skin lesions. No contractures.  Skin & Extremity Inspection: brace worn  Functional ROM: Unrestricted ROM                  Functional ROM: Decreased ROM                  Muscle Tone/Strength: Functionally intact. No obvious neuro-muscular anomalies detected.  Muscle Tone/Strength: Functionally intact. No obvious neuro-muscular anomalies detected.  Sensory (Neurological): Unimpaired  Sensory (Neurological): Unimpaired  Palpation: No palpable anomalies  Palpation: No palpable anomalies   Assessment  Primary Diagnosis & Pertinent Problem List: The primary encounter diagnosis  was Lumbosacral radiculopathy at L5. Diagnoses of Chronic lower extremity pain (1) (Left), Chronic pain syndrome, Foot drop (Left), Long term prescription opiate use, and Chronic low back pain (2) (Bilateral) (ML) (L>R) with sciatica (Left) were also pertinent to this visit.  Status Diagnosis  Persistent Persistent Persistent 1. Lumbosacral radiculopathy at L5   2. Chronic lower extremity pain (1) (Left)   3. Chronic pain syndrome   4. Foot drop (Left)   5. Long term prescription opiate use  6. Chronic low back pain (2) (Bilateral) (ML) (L>R) with sciatica (Left)     Problems updated and reviewed during this visit: No problems updated. Plan of Care  Pharmacotherapy (Medications Ordered): Meds ordered this encounter  Medications  . oxyCODONE (ROXICODONE) 15 MG immediate release tablet    Sig: Take 1 tablet (15 mg total) by mouth every 6 (six) hours as needed for pain.    Dispense:  120 tablet    Refill:  0    Do not place this medication, or any other prescription from our practice, on "Automatic Refill". Patient may have prescription filled one day early if pharmacy is closed on scheduled refill date. Do not fill until: 09/23/2017 To last until:10/23/2017    Order Specific Question:   Supervising Provider    Answer:   Milinda Pointer [phone]  . oxyCODONE (ROXICODONE) 15 MG immediate release tablet    Sig: Take 1 tablet (15 mg total) by mouth every 6 (six) hours as needed for pain.    Dispense:  120 tablet    Refill:  0    Do not place this medication, or any other prescription from our practice, on "Automatic Refill". Patient may have prescription filled one day early if pharmacy is closed on scheduled refill date. Do not fill until: 10/23/2017 To last until:11/22/2017    Order Specific Question:   Supervising Provider    Answer:   Milinda Pointer [phone]  . naloxone (NARCAN) nasal spray 4 mg/0.1 mL    Sig: Spray into one nostril. Repeat with second device into other  nostril after 2-3 minutes if no or minimal response. Use in case of opioid overdose.    Dispense:  1 kit    Refill:  0    Narcan Nasal Spray. (2 pack) Please provide the patient with clear instructions on the use of this device/medication.    Order Specific Question:   Supervising Provider    Answer:   Milinda Pointer [phone]   New Prescriptions   NALOXONE (NARCAN) NASAL SPRAY 4 MG/0.1 ML    Spray into one nostril. Repeat with second device into other nostril after 2-3 minutes if no or minimal response. Use in case of opioid overdose.   OXYCODONE (ROXICODONE) 15 MG IMMEDIATE RELEASE TABLET    Take 1 tablet (15 mg total) by mouth every 6 (six) hours as needed for pain.   Medications administered today: Evert Kohl. Pilar had no medications administered during this visit. Lab-work, procedure(s), and/or referral(s): Orders Placed This Encounter  Procedures  . ToxASSURE Select 13 (MW), Urine   Imaging and/or referral(s): None  Interventional management options: Planned, scheduled, and/or pending: Not at this time.    Considering: Diagnostic caudal epidural steroid injection + epidurogram Possible Racz procedure Diagnostic bilateral lumbar facet block Possible bilateral lumbar facet RFA Possible candidate for intrathecal pump trial and implant Possible candidate for spinal cord stimulator trial and implant   Palliative PRN treatment(s): Diagnostic left-sided L4 and [id number]#2    Provider-requested follow-up: Return in about 2 months (around 11/05/2017) for MedMgmt with Me Donella Stade Edison Pace).  Future Appointments  Date Time Provider [place]  11/14/2017  9:30 AM Vevelyn Francois, NP [facility] None   Primary Care Physician: Jodi Marble, MD Location: [facility] Note by: Vevelyn Francois NP Date: 09/04/2017; Time: 1:14 PM  Pain Score Disclaimer: We use the NRS-11 scale. This is a self-reported, subjective measurement of  pain severity with only modest accuracy. It is used primarily to identify  changes within a particular patient. It must be understood that outpatient pain scales are significantly less accurate that those used for research, where they can be applied under ideal controlled circumstances with minimal exposure to variables. In reality, the score is likely to be a combination of pain intensity and pain affect, where pain affect describes the degree of emotional arousal or changes in action readiness caused by the sensory experience of pain. Factors such as social and work situation, setting, emotional state, anxiety levels, expectation, and prior pain experience may influence pain perception and show large inter-individual differences that may also be affected by time variables.  Patient instructions provided during this appointment: Patient Instructions  ____________________________________________________________________________________________  Medication Rules  Applies to: All patients receiving prescriptions (written or electronic).  Pharmacy of record: Pharmacy where electronic prescriptions will be sent. If written prescriptions are taken to a different pharmacy, please inform the nursing staff. The pharmacy listed in the electronic medical record should be the one where you would like electronic prescriptions to be sent.  Prescription refills: Only during scheduled appointments. Applies to both, written and electronic prescriptions.  NOTE: The following applies primarily to controlled substances (Opioid* Pain Medications).   Patient's responsibilities: 1. Pain Pills: Bring all pain pills to every appointment (except for procedure appointments). 2. Pill Bottles: Bring pills in original pharmacy bottle. Always bring newest bottle. Bring bottle, even if empty. 3. Medication refills: You are responsible for knowing and keeping track of what medications you need refilled. The day before your  appointment, write a list of all prescriptions that need to be refilled. Bring that list to your appointment and give it to the admitting nurse. Prescriptions will be written only during appointments. If you forget a medication, it will not be "Called in", "Faxed", or "electronically sent". You will need to get another appointment to get these prescribed. 4. Prescription Accuracy: You are responsible for carefully inspecting your prescriptions before leaving our office. Have the discharge nurse carefully go over each prescription with you, before taking them home. Make sure that your name is accurately spelled, that your address is correct. Check the name and dose of your medication to make sure it is accurate. Check the number of pills, and the written instructions to make sure they are clear and accurate. Make sure that you are given enough medication to last until your next medication refill appointment. 5. Taking Medication: Take medication as prescribed. Never take more pills than instructed. Never take medication more frequently than prescribed. Taking less pills or less frequently is permitted and encouraged, when it comes to controlled substances (written prescriptions).  6. Inform other Doctors: Always inform, all of your healthcare providers, of all the medications you take. 7. Pain Medication from other Providers: You are not allowed to accept any additional pain medication from any other Doctor or Healthcare provider. There are two exceptions to this rule. (see below) In the event that you require additional pain medication, you are responsible for notifying us, as stated below. 8. Medication Agreement: You are responsible for carefully reading and following our Medication Agreement. This must be signed before receiving any prescriptions from our practice. Safely store a copy of your signed Agreement. Violations to the Agreement will result in no further prescriptions. (Additional copies of our  Medication Agreement are available upon request.) 9. Laws, Rules, & Regulations: All patients are expected to follow all Federal and [employer], [employer], Rules, [employer]. Ignorance of the Laws does not constitute a valid excuse. The  use of any illegal substances is prohibited. 10. Adopted CDC guidelines & recommendations: Target dosing levels will be at or below 60 MME/day. Use of benzodiazepines** is not recommended.  Exceptions: There are only two exceptions to the rule of not receiving pain medications from other Healthcare Providers. 1. Exception #1 (Emergencies): In the event of an emergency (i.e.: accident requiring emergency care), you are allowed to receive additional pain medication. However, you are responsible for: As soon as you are able, call our office [phone], at any time of the day or night, and leave a message stating your name, the date and nature of the emergency, and the name and dose of the medication prescribed. In the event that your call is answered by a member of our staff, make sure to document and save the date, time, and the name of the person that took your information.  2. Exception #2 (Planned Surgery): In the event that you are scheduled by another doctor or dentist to have any type of surgery or procedure, you are allowed (for a period no longer than 30 days), to receive additional pain medication, for the acute post-op pain. However, in this case, you are responsible for picking up a copy of our "Post-op Pain Management for Surgeons" handout, and giving it to your surgeon or dentist. This document is available at our office, and does not require an appointment to obtain it. Simply go to our office during business hours (Monday-Thursday from 8:00 AM to 4:00 PM) (Friday 8:00 AM to 12:00 Noon) or if you have a scheduled appointment with [place], prior to your surgery, and ask for it by name. In addition, you will need to provide [place] with your name, name of your surgeon,  type of surgery, and date of procedure or surgery.  *Opioid medications include: morphine, codeine, oxycodone, oxymorphone, hydrocodone, hydromorphone, meperidine, tramadol, tapentadol, buprenorphine, fentanyl, methadone. **Benzodiazepine medications include: diazepam (Valium), alprazolam (Xanax), clonazepam (Klonopine), lorazepam (Ativan), clorazepate (Tranxene), chlordiazepoxide (Librium), estazolam (Prosom), oxazepam (Serax), temazepam (Restoril), triazolam (Halcion) (Last updated: 04/05/2017) ____________________________________________________________________________________________

## 2017-09-04 NOTE — Patient Instructions (Addendum)
____________________________________________________________________________________________  Medication Rules  Applies to: All patients receiving prescriptions (written or electronic).  Pharmacy of record: Pharmacy where electronic prescriptions will be sent. If written prescriptions are taken to a different pharmacy, please inform the nursing staff. The pharmacy listed in the electronic medical record should be the one where you would like electronic prescriptions to be sent.  Prescription refills: Only during scheduled appointments. Applies to both, written and electronic prescriptions.  NOTE: The following applies primarily to controlled substances (Opioid* Pain Medications).   Patient's responsibilities: 1. Pain Pills: Bring all pain pills to every appointment (except for procedure appointments). 2. Pill Bottles: Bring pills in original pharmacy bottle. Always bring newest bottle. Bring bottle, even if empty. 3. Medication refills: You are responsible for knowing and keeping track of what medications you need refilled. The day before your appointment, write a list of all prescriptions that need to be refilled. Bring that list to your appointment and give it to the admitting nurse. Prescriptions will be written only during appointments. If you forget a medication, it will not be "Called in", "Faxed", or "electronically sent". You will need to get another appointment to get these prescribed. 4. Prescription Accuracy: You are responsible for carefully inspecting your prescriptions before leaving our office. Have the discharge nurse carefully go over each prescription with you, before taking them home. Make sure that your name is accurately spelled, that your address is correct. Check the name and dose of your medication to make sure it is accurate. Check the number of pills, and the written instructions to make sure they are clear and accurate. Make sure that you are given enough medication to last  until your next medication refill appointment. 5. Taking Medication: Take medication as prescribed. Never take more pills than instructed. Never take medication more frequently than prescribed. Taking less pills or less frequently is permitted and encouraged, when it comes to controlled substances (written prescriptions).  6. Inform other Doctors: Always inform, all of your healthcare providers, of all the medications you take. 7. Pain Medication from other Providers: You are not allowed to accept any additional pain medication from any other Doctor or Healthcare provider. There are two exceptions to this rule. (see below) In the event that you require additional pain medication, you are responsible for notifying us, as stated below. 8. Medication Agreement: You are responsible for carefully reading and following our Medication Agreement. This must be signed before receiving any prescriptions from our practice. Safely store a copy of your signed Agreement. Violations to the Agreement will result in no further prescriptions. (Additional copies of our Medication Agreement are available upon request.) 9. Laws, Rules, & Regulations: All patients are expected to follow all Federal and State Laws, Statutes, Rules, & Regulations. Ignorance of the Laws does not constitute a valid excuse. The use of any illegal substances is prohibited. 10. Adopted CDC guidelines & recommendations: Target dosing levels will be at or below 60 MME/day. Use of benzodiazepines** is not recommended.  Exceptions: There are only two exceptions to the rule of not receiving pain medications from other Healthcare Providers. 1. Exception #1 (Emergencies): In the event of an emergency (i.e.: accident requiring emergency care), you are allowed to receive additional pain medication. However, you are responsible for: As soon as you are able, call our office [phone], at any time of the day or night, and leave a message stating your name, the  date and nature of the emergency, and the name and dose of the medication   prescribed. In the event that your call is answered by a member of our staff, make sure to document and save the date, time, and the name of the person that took your information.  2. Exception #2 (Planned Surgery): In the event that you are scheduled by another doctor or dentist to have any type of surgery or procedure, you are allowed (for a period no longer than 30 days), to receive additional pain medication, for the acute post-op pain. However, in this case, you are responsible for picking up a copy of our "Post-op Pain Management for Surgeons" handout, and giving it to your surgeon or dentist. This document is available at our office, and does not require an appointment to obtain it. Simply go to our office during business hours (Monday-Thursday from 8:00 AM to 4:00 PM) (Friday 8:00 AM to 12:00 Noon) or if you have a scheduled appointment with us, prior to your surgery, and ask for it by name. In addition, you will need to provide us with your name, name of your surgeon, type of surgery, and date of procedure or surgery.  *Opioid medications include: morphine, codeine, oxycodone, oxymorphone, hydrocodone, hydromorphone, meperidine, tramadol, tapentadol, buprenorphine, fentanyl, methadone. **Benzodiazepine medications include: diazepam (Valium), alprazolam (Xanax), clonazepam (Klonopine), lorazepam (Ativan), clorazepate (Tranxene), chlordiazepoxide (Librium), estazolam (Prosom), oxazepam (Serax), temazepam (Restoril), triazolam (Halcion) (Last updated: 04/05/2017) ____________________________________________________________________________________________    

## 2017-09-04 NOTE — Progress Notes (Signed)
Nursing Pain Medication Assessment:  Safety precautions to be maintained throughout the outpatient stay will include: orient to surroundings, keep bed in low position, maintain call bell within reach at all times, provide assistance with transfer out of bed and ambulation.  Medication Inspection Compliance: Pill count conducted under aseptic conditions, in front of the patient. Neither the pills nor the bottle was removed from the patient's sight at any time. Once count was completed pills were immediately returned to the patient in their original bottle.  Medication: See above Pill/Patch Count: 77 of 120 pills remain Pill/Patch Appearance: Markings consistent with prescribed medication Bottle Appearance: Standard pharmacy container. Clearly labeled. Filled Date: 7 / 54 / 2019 Last Medication intake:  Today

## 2017-10-18 ENCOUNTER — Other Ambulatory Visit: Payer: Self-pay | Admitting: Student

## 2017-10-18 DIAGNOSIS — R110 Nausea: Secondary | ICD-10-CM

## 2017-10-18 DIAGNOSIS — R6881 Early satiety: Secondary | ICD-10-CM

## 2017-11-01 ENCOUNTER — Encounter: Payer: Self-pay | Admitting: Nurse Practitioner

## 2017-11-08 ENCOUNTER — Emergency Department
Admission: EM | Admit: 2017-11-08 | Discharge: 2017-11-08 | Disposition: A | Discharge status: Home / Self Care | Payer: Medicare Other | Attending: Emergency Medicine | Admitting: Emergency Medicine

## 2017-11-08 ENCOUNTER — Emergency Department: Payer: Medicare Other

## 2017-11-08 ENCOUNTER — Encounter: Payer: Self-pay | Admitting: Emergency Medicine

## 2017-11-08 DIAGNOSIS — Z79899 Other long term (current) drug therapy: Secondary | ICD-10-CM | POA: Diagnosis not present

## 2017-11-08 DIAGNOSIS — F17200 Nicotine dependence, unspecified, uncomplicated: Secondary | ICD-10-CM | POA: Insufficient documentation

## 2017-11-08 DIAGNOSIS — Y939 Activity, unspecified: Secondary | ICD-10-CM | POA: Diagnosis not present

## 2017-11-08 DIAGNOSIS — Z23 Encounter for immunization: Secondary | ICD-10-CM | POA: Insufficient documentation

## 2017-11-08 DIAGNOSIS — Y999 Unspecified external cause status: Secondary | ICD-10-CM | POA: Insufficient documentation

## 2017-11-08 DIAGNOSIS — Y929 Unspecified place or not applicable: Secondary | ICD-10-CM | POA: Diagnosis not present

## 2017-11-08 DIAGNOSIS — E039 Hypothyroidism, unspecified: Secondary | ICD-10-CM | POA: Insufficient documentation

## 2017-11-08 DIAGNOSIS — S91115A Laceration without foreign body of left lesser toe(s) without damage to nail, initial encounter: Secondary | ICD-10-CM

## 2017-11-08 DIAGNOSIS — S99922A Unspecified injury of left foot, initial encounter: Secondary | ICD-10-CM | POA: Diagnosis present

## 2017-11-08 DIAGNOSIS — X58XXXA Exposure to other specified factors, initial encounter: Secondary | ICD-10-CM | POA: Insufficient documentation

## 2017-11-08 DIAGNOSIS — E119 Type 2 diabetes mellitus without complications: Secondary | ICD-10-CM | POA: Insufficient documentation

## 2017-11-08 DIAGNOSIS — Z7982 Long term (current) use of aspirin: Secondary | ICD-10-CM | POA: Diagnosis not present

## 2017-11-08 MED ORDER — TETANUS-DIPHTH-ACELL PERTUSSIS 5-2.5-18.5 LF-MCG/0.5 IM SUSP
0.5000 mL | Freq: Once | INTRAMUSCULAR | Status: AC
  Administered 2017-11-08: 0.5 mL via INTRAMUSCULAR
  Filled 2017-11-08: qty 0.5

## 2017-11-08 MED ORDER — ONDANSETRON 4 MG PO TBDP
4.0000 mg | ORAL_TABLET | Freq: Once | ORAL | Status: AC
  Administered 2017-11-08: 4 mg via ORAL
  Filled 2017-11-08: qty 1

## 2017-11-08 MED ORDER — LIDOCAINE HCL (PF) 1 % IJ SOLN
5.0000 mL | Freq: Once | INTRAMUSCULAR | Status: AC
  Administered 2017-11-08: 5 mL
  Filled 2017-11-08: qty 5

## 2017-11-08 MED ORDER — BACITRACIN-NEOMYCIN-POLYMYXIN 400-5-5000 EX OINT
TOPICAL_OINTMENT | Freq: Once | CUTANEOUS | Status: AC
  Administered 2017-11-08: 1 via TOPICAL
  Filled 2017-11-08: qty 1

## 2017-11-08 NOTE — ED Triage Notes (Signed)
Pt second toe still bleeding slightly, area wrapped.

## 2017-11-08 NOTE — Discharge Instructions (Addendum)
Follow-up with your regular doctor if any sign of infection.  Have the sutures removed in 10 days.  Keep the areas clean and dry as possible.  He still may shower and wash the area with soap and water daily.  Return to the emergency department if needed.

## 2017-11-08 NOTE — ED Provider Notes (Signed)
[facility] Emergency Department Provider Note  ____________________________________________   First MD Initiated Contact with Patient 11/08/17 1041     (approximate)  I have reviewed the triage vital signs and the nursing notes.   HISTORY  Chief Complaint Toe Injury and Laceration    HPI Chase Mendez is a 83 y.o. male presents emergency department with a laceration to the left second toe.  He states he has foot drop in his foot caught underneath him and now he has a cut on the foot.  He denies any other injuries.  He did not fall or hit his head.    Past Medical History:  Diagnosis Date  . Acute encephalopathy 12/08/2014  . Anxiety   . ARF (acute renal failure) ([place]) 12/08/2014  . Back pain   . Benign neoplasm of large bowel   . Capsulitis    fractured displaced metatarsal with capsulitis  . Chronic back pain   . Depression   . Diabetes mellitus without complication ([place])   . Dysphagia   . Exostosis    painful, right hallux  . GERD (gastroesophageal reflux disease)   . Gout   . Hypothyroidism   . Insomnia   . Low testosterone   . Microscopic hematuria   . Pneumonia 12/07/2014  . Pressure ulcer 12/09/2014  . Sepsis ([place]) 12/08/2014  . Thyroid disease     Patient Active Problem List   Diagnosis Date Noted  . Chronic pain syndrome 07/11/2017  . DDD (degenerative disc disease), lumbar 06/19/2017  . Opioid Drug tolerance 06/10/2017  . History of acute renal failure (12/08/2014) 06/10/2017  . History of encephalopathy (12/08/2014) 06/10/2017  . History of sepsis (12/08/2014) 06/10/2017  . Lower limb pain, inferior, left (L5) (Foot Drop) 05/16/2017  . Chronic low back pain (2) (Bilateral) (ML) (L>R) with sciatica (Left) 05/16/2017  . Chronic lower extremity pain (1) (Left) 05/16/2017  . 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 Foot drop (Left) 05/16/2017  . Lumbosacral radiculopathy at L5 05/16/2017  . Disorder of skeletal system  05/16/2017  . Pharmacologic therapy 05/16/2017  . Problems influencing health status 05/16/2017  . Venous reflux 07/12/2016  . 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 Varicose veins of both lower extremities with pain 05/09/2016  . Lower extremity pain, bilateral 05/09/2016    Past Surgical History:  Procedure Laterality Date  . APPENDECTOMY    . BACK SURGERY    . CHOLECYSTECTOMY    . ESOPHAGOGASTRODUODENOSCOPY (EGD) WITH PROPOFOL N/A 04/24/2017   Procedure: ESOPHAGOGASTRODUODENOSCOPY (EGD) WITH PROPOFOL;  Surgeon: Manya Silvas, MD;  Location: [facility] ENDOSCOPY;  Service: Endoscopy;  Laterality: N/A;  . EYE SURGERY      Prior to Admission medications   Medication Sig Start Date End Date Taking? Authorizing Provider  allopurinol (ZYLOPRIM) 100 MG tablet Take 100 mg by mouth daily. 10/26/14   [provider]  aspirin EC 81 MG tablet Take 81 mg by mouth daily.    [provider]  atorvastatin (LIPITOR) 10 MG tablet Take 10 mg by mouth every evening. 04/08/16   [provider]  benzonatate (TESSALON) 100 MG capsule take 1 capsule by mouth three times a day 03/27/16   [provider]  citalopram (CELEXA) 20 MG tablet Take 20 mg by mouth daily.    [provider]  Cyanocobalamin 1000 MCG/ML LIQD Take 1,000 mg by mouth daily.    [provider]  diphenhydrAMINE (BENADRYL) 25 MG tablet Take 25 mg by mouth every 6 (  six) hours as needed.    [provider]  fluticasone (FLONASE) 50 MCG/ACT nasal spray fluticasone propionate 50 mcg/act susp    [provider]  gabapentin (NEURONTIN) 600 MG tablet Take 400 mg by mouth 3 (three) times daily.  11/26/14   [provider]  isosorbide dinitrate (ISORDIL) 30 MG tablet Take 30 mg by mouth 3 (three) times daily.    [provider]  levothyroxine (SYNTHROID, LEVOTHROID) 150 MCG tablet Take 150 mcg by mouth every morning. 09/12/14   [provider]  linaclotide  (LINZESS) 290 MCG CAPS capsule Take 290 mcg by mouth daily before breakfast.    [provider]  losartan (COZAAR) 50 MG tablet Take 50 mg by mouth daily.    [provider]  metFORMIN (GLUCOPHAGE) 500 MG tablet Take 500 mg by mouth every evening. 04/08/16   [provider]  mirabegron ER (MYRBETRIQ) 25 MG TB24 tablet Take 25 mg by mouth daily.    [provider]  naloxone [facility]) nasal spray 4 mg/0.1 mL Spray into one nostril. Repeat with second device into other nostril after 2-3 minutes if no or minimal response. Use in case of opioid overdose. 09/04/17   Vevelyn Francois, NP  omeprazole (PRILOSEC) 20 MG capsule Take 20 mg by mouth daily.    [provider]  ondansetron (ZOFRAN ODT) 4 MG disintegrating tablet Take 1 tablet (4 mg total) by mouth every 8 (eight) hours as needed for nausea or vomiting. 08/21/16   Darel Hong, MD  oxyCODONE (ROXICODONE) 15 MG immediate release tablet Take 1 tablet (15 mg total) by mouth every 6 (six) hours as needed for pain. 09/23/17 10/23/17  Vevelyn Francois, NP  oxyCODONE (ROXICODONE) 15 MG immediate release tablet Take 1 tablet (15 mg total) by mouth every 6 (six) hours as needed for pain. 10/23/17 11/22/17  Vevelyn Francois, NP  testosterone (ANDROGEL) 50 MG/5GM (1%) GEL Place onto the skin daily.    [provider]  vitamin E 400 UNIT capsule Take 400 Units by mouth daily.    [provider]    Allergies Ibuprofen; Sulfa antibiotics; Sulfasalazine; and Lidocaine  Family History  Problem Relation Age of Onset  . Heart disease Mother   . Diabetes Father     Social History Social History   Tobacco Use  . Smoking status: Current Every Day Smoker    Types: Cigars  . Smokeless tobacco: Never Used  Substance Use Topics  . Alcohol use: No  . Drug use: No    Review of Systems  Constitutional: No fever/chills Eyes: No visual changes. ENT: No sore throat. Respiratory: Denies  cough Genitourinary: Negative for dysuria. Musculoskeletal: Negative for back pain. Skin: Negative for rash.  Positive laceration to left second    ____________________________________________   PHYSICAL EXAM:  VITAL SIGNS: ED Triage Vitals  Enc Vitals Group     BP 11/08/17 0953 (!) 142/68     Pulse Rate 11/08/17 0953 63     Resp 11/08/17 0953 18     Temp 11/08/17 0953 98.4 F (36.9 C)     Temp Source 11/08/17 0953 Oral     SpO2 11/08/17 0953 95 %     Weight 11/08/17 0951 189 lb (85.7 kg)     Height 11/08/17 0951 5\' 11"  (1.803 m)     Head Circumference --      Peak Flow --      Pain Score 11/08/17 0950 0     Pain Loc --  Pain Edu? --      Excl. in [place]? --     Constitutional: Alert and oriented. Well appearing and in no acute distress. Eyes: Conjunctivae are normal.  Head: Atraumatic. Nose: No congestion/rhinnorhea. Mouth/Throat: Mucous membranes are moist.   Neck:  supple no lymphadenopathy noted Cardiovascular: Normal rate, regular rhythm. Heart sounds are normal Respiratory: Normal respiratory effort.  No retractions, lungs c t a  GU: deferred Musculoskeletal: FROM all extremities, warm and well perfused.  Positive laceration to the left second toe.  There is a small Julli tender and bruised. Neurologic:  Normal speech and language.  Skin:  Skin is warm, dry .  Positive laceration left second toe.  no rash noted. Psychiatric: Mood and affect are normal. Speech and behavior are normal.  ____________________________________________   LABS (all labs ordered are listed, but only abnormal results are displayed)  Labs Reviewed - No data to display ____________________________________________   ____________________________________________  RADIOLOGY  X-ray left foot is negative  ____________________________________________   PROCEDURES  Procedure(s) performed:   Marland KitchenMarland KitchenLaceration Repair Date/Time: 11/08/2017 12:01 PM Performed by: Versie Starks,  PA-C Authorized by: Versie Starks, PA-C   Consent:    Consent obtained:  Verbal   Consent given by:  Patient   Risks discussed:  Infection, pain and poor wound healing   Alternatives discussed:  Delayed treatment Anesthesia (see MAR for exact dosages):    Anesthesia method:  Local infiltration   Local anesthetic:  Lidocaine 1% w/o epi Laceration details:    Location:  Toe   Toe location:  L second toe   Length (cm):  1.5   Depth (mm):  2 Repair type:    Repair type:  Simple Pre-procedure details:    Preparation:  Patient was prepped and draped in usual sterile fashion Exploration:    Wound exploration: wound explored through full range of motion     Wound extent: no foreign bodies/material noted and no underlying fracture noted     Contaminated: no   Treatment:    Area cleansed with:  Betadine and saline   Amount of cleaning:  Standard   Irrigation solution:  Sterile saline   Irrigation method:  Syringe and tap Skin repair:    Repair method:  Sutures   Suture size:  5-0   Suture material:  Nylon   Suture technique:  Simple interrupted   Number of sutures:  5 Approximation:    Approximation:  Close Post-procedure details:    Dressing:  Antibiotic ointment and non-adherent dressing   Patient tolerance of procedure:  Tolerated well, no immediate complications      ____________________________________________   INITIAL IMPRESSION / ASSESSMENT AND PLAN / ED COURSE  Pertinent labs & imaging results that were available during my care of the patient were reviewed by me and considered in my medical decision making (see chart for details).   Patient is a 83 year old male presents emergency department with laceration to the left second toe.  On physical exam left second toe has a 1.5 cm laceration.  X-ray of the left foot is negative  See procedure note for repair.  Patient tolerated procedure well.  Dressing was applied by nursing staff.  His wife was given  instructions on how to care for the wound.  The patient is to have sutures removed in approximately 10 days.  Return to the emergency department or see his regular doctor if there is any sign of infection.  He and his wife both state they understand will  comply.  He was given Tdap while here in the emergency department.  He was discharged in stable condition.     As part of my medical decision making, I reviewed the following data within the [facility] notes reviewed and incorporated, Old chart reviewed, Radiograph reviewed x-ray left foot is negative, Notes from prior ED visits and [place] Controlled Substance Database  ____________________________________________   FINAL CLINICAL IMPRESSION(S) / ED DIAGNOSES  Final diagnoses:  Laceration of second toe of left foot, initial encounter      NEW MEDICATIONS STARTED DURING THIS VISIT:  Discharge Medication List as of 11/08/2017 12:01 PM       Note:  This document was prepared using Dragon voice recognition software and may include unintentional dictation errors.    Versie Starks, PA-C 11/08/17 1530    Lavonia Drafts, MD 11/08/17 [phone]

## 2017-11-08 NOTE — ED Triage Notes (Signed)
Pt reports has foot drop in his left foot and hit it on the carpet this am and now his second toe is cut and will not stop bleeding.

## 2017-11-14 ENCOUNTER — Other Ambulatory Visit: Payer: Self-pay

## 2017-11-14 ENCOUNTER — Encounter: Payer: Self-pay | Admitting: Nurse Practitioner

## 2017-11-14 ENCOUNTER — Ambulatory Visit: Payer: Worker's Compensation | Attending: Nurse Practitioner | Admitting: Nurse Practitioner

## 2017-11-14 VITALS — BP 146/73 | HR 68 | Temp 98.2°F | Resp 18 | Ht 71.0 in | Wt 189.0 lb

## 2017-11-14 DIAGNOSIS — M21372 Foot drop, left foot: Secondary | ICD-10-CM | POA: Insufficient documentation

## 2017-11-14 DIAGNOSIS — Z7951 Long term (current) use of inhaled steroids: Secondary | ICD-10-CM | POA: Diagnosis not present

## 2017-11-14 DIAGNOSIS — F419 Anxiety disorder, unspecified: Secondary | ICD-10-CM | POA: Insufficient documentation

## 2017-11-14 DIAGNOSIS — R1310 Dysphagia, unspecified: Secondary | ICD-10-CM | POA: Insufficient documentation

## 2017-11-14 DIAGNOSIS — N179 Acute kidney failure, unspecified: Secondary | ICD-10-CM | POA: Insufficient documentation

## 2017-11-14 DIAGNOSIS — M5417 Radiculopathy, lumbosacral region: Secondary | ICD-10-CM | POA: Diagnosis not present

## 2017-11-14 DIAGNOSIS — Z79899 Other long term (current) drug therapy: Secondary | ICD-10-CM | POA: Insufficient documentation

## 2017-11-14 DIAGNOSIS — G894 Chronic pain syndrome: Secondary | ICD-10-CM | POA: Insufficient documentation

## 2017-11-14 DIAGNOSIS — E039 Hypothyroidism, unspecified: Secondary | ICD-10-CM | POA: Insufficient documentation

## 2017-11-14 DIAGNOSIS — F17290 Nicotine dependence, other tobacco product, uncomplicated: Secondary | ICD-10-CM | POA: Diagnosis not present

## 2017-11-14 DIAGNOSIS — G8929 Other chronic pain: Secondary | ICD-10-CM

## 2017-11-14 DIAGNOSIS — E1140 Type 2 diabetes mellitus with diabetic neuropathy, unspecified: Secondary | ICD-10-CM | POA: Insufficient documentation

## 2017-11-14 DIAGNOSIS — Z7982 Long term (current) use of aspirin: Secondary | ICD-10-CM | POA: Diagnosis not present

## 2017-11-14 DIAGNOSIS — G4700 Insomnia, unspecified: Secondary | ICD-10-CM | POA: Insufficient documentation

## 2017-11-14 DIAGNOSIS — Z79890 Hormone replacement therapy (postmenopausal): Secondary | ICD-10-CM | POA: Diagnosis not present

## 2017-11-14 DIAGNOSIS — J189 Pneumonia, unspecified organism: Secondary | ICD-10-CM | POA: Diagnosis not present

## 2017-11-14 DIAGNOSIS — F329 Major depressive disorder, single episode, unspecified: Secondary | ICD-10-CM | POA: Diagnosis not present

## 2017-11-14 DIAGNOSIS — M5442 Lumbago with sciatica, left side: Secondary | ICD-10-CM | POA: Diagnosis not present

## 2017-11-14 DIAGNOSIS — M5136 Other intervertebral disc degeneration, lumbar region: Secondary | ICD-10-CM | POA: Insufficient documentation

## 2017-11-14 DIAGNOSIS — Z79891 Long term (current) use of opiate analgesic: Secondary | ICD-10-CM | POA: Diagnosis not present

## 2017-11-14 DIAGNOSIS — Z8249 Family history of ischemic heart disease and other diseases of the circulatory system: Secondary | ICD-10-CM | POA: Insufficient documentation

## 2017-11-14 DIAGNOSIS — K219 Gastro-esophageal reflux disease without esophagitis: Secondary | ICD-10-CM | POA: Insufficient documentation

## 2017-11-14 MED ORDER — OXYCODONE HCL 15 MG PO TABS: 15.0000 mg | ORAL_TABLET | Freq: Four times a day (QID) | ORAL | 0 refills | Status: DC | PRN

## 2017-11-14 NOTE — Progress Notes (Signed)
Nursing Pain Medication Assessment:  Safety precautions to be maintained throughout the outpatient stay will include: orient to surroundings, keep bed in low position, maintain call bell within reach at all times, provide assistance with transfer out of bed and ambulation.   Medication Inspection Compliance: Pill count conducted under aseptic conditions, in front of the patient. Neither the pills nor the bottle was removed from the patient's sight at any time. Once count was completed pills were immediately returned to the patient in their original bottle.  Medication: Oxycodone HCL Pill/Patch Count: 37 of 120 pills remain Pill/Patch Appearance: Markings consistent with prescribed medication Bottle Appearance: Standard pharmacy container. Clearly labeled. Filled Date: 09 / 17 / 2019 Last Medication intake:  Today

## 2017-11-14 NOTE — Progress Notes (Signed)
Patient's Name: Chase Chase Mendez  MRN: [id number]  Referring Provider: Jodi Marble, MD  DOB: [birth year]  PCP: Jodi Marble, MD  DOS: 11/14/2017  Note by: Vevelyn Francois NP  Service setting: Ambulatory outpatient  Specialty: Interventional Pain Management  Location: [facility] (AMB) Pain Management Facility    Patient type: Established    Primary Reason(s) for Visit: Encounter for prescription drug management. (Level of risk: moderate)  CC: No chief complaint on file.  HPI  Mr. Chase Chase Mendez is a 83 y.o. year old, male patient, who comes today for a medication management evaluation. He has Pneumonia; ARF (acute renal failure) ([place]); Spinal accessory neuropathy; Varicose veins of both lower extremities with pain; Lower extremity pain, bilateral; Venous reflux; Lower limb pain, inferior, left (L5) ([place]); Chronic low back pain (2) (Bilateral) (ML) (L>R) with sciatica (Left); Chronic lower extremity pain (1) (Left); Failed back surgical syndrome; Foot drop (Left); Lumbosacral radiculopathy at L5; Disorder of skeletal system; Pharmacologic therapy; Problems influencing health status; Opioid Drug tolerance; History of acute renal failure (12/08/2014); History of encephalopathy (12/08/2014); History of sepsis (12/08/2014); DDD (degenerative disc disease), lumbar; and Chronic pain syndrome on their problem list. His primarily concern today is the No chief complaint on file.  Pain Assessment: Location: Left Buttocks Radiating: Radiates from left buttocks down to back of enitre leg on left side to feet.  Onset: More than a month ago Duration: Chronic pain Quality: Sharp, Shooting Severity: 7 /10 (subjective, self-reported pain score)  Note: Reported level is compatible with observation. Clinically the patient looks like a 2/10 A 2/10 is viewed as "Mild to Moderate" and described as noticeable and distracting. Impossible to hide from other people. More frequent flare-ups. Still possible to adapt and function  close to normal. It can be very annoying and may have occasional stronger flare-ups. With discipline, patients may get used to it and adapt. Information on the proper use of the pain scale provided to the patient today. When using our objective Pain Scale, levels between 6 and 10/10 are said to belong in an emergency room, as it progressively worsens from a 6/10, described as severely limiting, requiring emergency care not usually available at an outpatient pain management facility. At a 6/10 level, communication becomes difficult and requires great effort. Assistance to reach the emergency department may be required. Facial flushing and profuse sweating along with potentially dangerous increases in heart rate and blood pressure will be evident. Effect on ADL: "Unable to do much. Difficult to walk"  Timing: Intermittent Modifying factors: Medications (Oxycodone), resting while having leg elevated BP: (!) 146/73  HR: 68  Mr. Chase Chase Mendez was last scheduled for an appointment on 09/04/2017 for medication management. During today's appointment we reviewed [name] chronic pain status, as well as his outpatient medication regimen.  He has agreed that he wants to try the spinal cord stimulator.  He admits that his current medication is not effective for him and he spends much of his day lying down.  He admits that he was on methadone in the past which seemed to be very effective.  His wife states that he was having increased falls and this is why the methadone was discontinued.  He admits that he just wants a better life.  He is concerned now with a spinal cord stimulator is the out-of-pocket cost.  The patient  reports that he does not use drugs. His body mass index is 26.36 kg/m.  Further details on both, my assessment(s), as well as the  proposed treatment plan, please see below.  Controlled Substance Pharmacotherapy Assessment REMS (Risk Evaluation and Mitigation Strategy)  Analgesic:Oxycodone IR 15 mg  p.o. 4 times daily MME/day:'90mg'$ /day  Janne Napoleon, RN  11/14/2017  9:44 AM  Sign at close encounter Nursing Pain Medication Assessment:  Safety precautions to be maintained throughout the outpatient stay will include: orient to surroundings, keep bed in low position, maintain call bell within reach at all times, provide assistance with transfer out of bed and ambulation.   Medication Inspection Compliance: Pill count conducted under aseptic conditions, in front of the patient. Neither the pills nor the bottle was removed from the patient's sight at any time. Once count was completed pills were immediately returned to the patient in their original bottle.  Medication: Oxycodone HCL Pill/Patch Count: 37 of 120 pills remain Pill/Patch Appearance: Markings consistent with prescribed medication Bottle Appearance: Standard pharmacy container. Clearly labeled. Filled Date: 09 / 17 / 2019 Last Medication intake:  Today   Pharmacokinetics: Liberation and absorption (onset of action): WNL Distribution (time to peak effect): WNL Metabolism and excretion (duration of action): WNL         Pharmacodynamics: Desired effects: Analgesia: [name] reports >50% benefit. Functional ability: Patient reports that medication allows him to accomplish basic ADLs Clinically meaningful improvement in function (CMIF): Sustained CMIF goals met Perceived effectiveness: Described as relatively effective, allowing for increase in activities of daily living (ADL) Undesirable effects: Side-effects or Adverse reactions: None reported Monitoring: [place] PMP: Online review of the past 24-monthperiod conducted. Compliant with practice rules and regulations Last UDS on record: No results found for: SUMMARY UDS interpretation: Compliant          Medication Assessment Form: Reviewed. Patient indicates being compliant with therapy Treatment compliance: Compliant Risk Assessment Profile: Aberrant behavior: See prior  evaluations. None observed or detected today Comorbid factors increasing risk of overdose: See prior notes. No additional risks detected today Opioid risk tool (ORT) (Total Score): 3 Personal History of Substance Abuse (SUD-Substance use disorder):  Alcohol: Negative  Illegal Drugs: Negative  Rx Drugs: Negative  ORT Risk Level calculation: Low Risk Risk of substance use disorder (SUD): Low Opioid Risk Tool - 11/14/17 0941      Family History of Substance Abuse   Alcohol  Positive Male    Illegal Drugs  Negative    Rx Drugs  Negative      Personal History of Substance Abuse   Alcohol  Negative    Illegal Drugs  Negative    Rx Drugs  Negative      Age   Age between 151-45years   No      History of Preadolescent Sexual Abuse   History of Preadolescent Sexual Abuse  Negative or Male      Psychological Disease   Psychological Disease  Negative    Depression  Negative      Total Score   Opioid Risk Tool Scoring  3    Opioid Risk Interpretation  Low Risk      ORT Scoring interpretation table:  Score <3 = Low Risk for SUD  Score between 4-7 = Moderate Risk for SUD  Score >8 = High Risk for Opioid Abuse   Risk Mitigation Strategies:  Patient Counseling: Covered Patient-Prescriber Agreement (PPA): Present and active  Notification to other healthcare providers: Done  Pharmacologic Plan: No change in therapy, at this time.             Laboratory Chemistry  Inflammation Markers (CRP:  Acute Phase) (ESR: Chronic Phase) Lab Results  Component Value Date   LATICACIDVEN 1.1 12/08/2014                         Rheumatology Markers No results found for: RF, ANA, LABURIC, URICUR, LYMEIGGIGMAB, LYMEABIGMQN, HLAB27                      Renal Function Markers Lab Results  Component Value Date   BUN 16 01/25/2017   CREATININE 0.98 01/25/2017   GFRAA >60 01/25/2017   GFRNONAA >60 01/25/2017                             Hepatic Function Markers Lab Results  Component Value  Date   AST 28 08/21/2016   ALT 18 08/21/2016   ALBUMIN 4.1 08/21/2016   ALKPHOS 112 08/21/2016   LIPASE 37 08/21/2016                        Electrolytes Lab Results  Component Value Date   NA 137 01/25/2017   K 3.2 (L) 01/25/2017   CL 99 (L) 01/25/2017   CALCIUM 9.2 01/25/2017                        Neuropathy Markers Lab Results  Component Value Date   HGBA1C 6.3 (H) 12/08/2014                        CNS Tests No results found for: COLORCSF, APPEARCSF, RBCCOUNTCSF, WBCCSF, POLYSCSF, LYMPHSCSF, EOSCSF, PROTEINCSF, GLUCCSF, JCVIRUS, CSFOLI, IGGCSF                      Bone Pathology Markers No results found for: VD25OH, [id number], [id number], [id number], 25OHVITD1, 25OHVITD2, 25OHVITD3, TESTOFREE, TESTOSTERONE                       Coagulation Parameters Lab Results  Component Value Date   PLT 169 01/25/2017                        Cardiovascular Markers Lab Results  Component Value Date   TROPONINI <0.03 01/25/2017   HGB 16.6 01/25/2017   HCT 47.0 01/25/2017                         CA Markers No results found for: CEA, CA125, LABCA2                      Note: Lab results reviewed.  Recent Diagnostic Imaging Results  DG Foot Complete Left CLINICAL DATA:  Injury.  Pain second toe  EXAM: LEFT FOOT - COMPLETE 3+ VIEW  COMPARISON:  None.  FINDINGS: Negative for fracture. Mild degenerative change first MTP and interphalangeal joint. No erosion.  IMPRESSION: No acute abnormality.  Electronically Signed   By: Franchot Gallo M.D.   On: 11/08/2017 10:34  Complexity Note: Imaging results reviewed. Results shared with Mr. Stordahl, using Layman's terms.                         Meds   Current Outpatient Medications:  .  allopurinol (ZYLOPRIM) 100 MG tablet, Take 100 mg by mouth daily., Disp: , Rfl: 0 .  aspirin EC 81 MG tablet, Take 81 mg by mouth daily., Disp: , Rfl:  .  atorvastatin (LIPITOR) 10 MG tablet, Take 10 mg by mouth every evening., Disp: , Rfl:  0 .  citalopram (CELEXA) 20 MG tablet, Take 20 mg by mouth daily., Disp: , Rfl:  .  Cyanocobalamin 1000 MCG/ML LIQD, Take 1,000 mg by mouth daily., Disp: , Rfl:  .  diphenhydrAMINE (BENADRYL) 25 MG tablet, Take 25 mg by mouth every 6 (six) hours as needed., Disp: , Rfl:  .  fluticasone (FLONASE) 50 MCG/ACT nasal spray, fluticasone propionate 50 mcg/act susp, Disp: , Rfl:  .  gabapentin (NEURONTIN) 400 MG capsule, Take 400 mg by mouth 3 (three) times daily., Disp: , Rfl:  .  isosorbide dinitrate (ISORDIL) 30 MG tablet, Take 30 mg by mouth 3 (three) times daily., Disp: , Rfl:  .  levothyroxine (SYNTHROID, LEVOTHROID) 150 MCG tablet, Take 150 mcg by mouth every morning., Disp: , Rfl: 0 .  linaclotide (LINZESS) 290 MCG CAPS capsule, Take 290 mcg by mouth daily before breakfast., Disp: , Rfl:  .  losartan (COZAAR) 50 MG tablet, Take 50 mg by mouth daily., Disp: , Rfl:  .  mirabegron ER (MYRBETRIQ) 25 MG TB24 tablet, Take 25 mg by mouth daily., Disp: , Rfl:  .  naloxone (NARCAN) nasal spray 4 mg/0.1 mL, Spray into one nostril. Repeat with second device into other nostril after 2-3 minutes if no or minimal response. Use in case of opioid overdose., Disp: 1 kit, Rfl: 0 .  omeprazole (PRILOSEC) 20 MG capsule, Take 20 mg by mouth daily., Disp: , Rfl:  .  ondansetron (ZOFRAN ODT) 4 MG disintegrating tablet, Take 1 tablet (4 mg total) by mouth every 8 (eight) hours as needed for nausea or vomiting., Disp: 20 tablet, Rfl: 0 .  [START ON 12/22/2017] oxyCODONE (ROXICODONE) 15 MG immediate release tablet, Take 1 tablet (15 mg total) by mouth every 6 (six) hours as needed for pain., Disp: 120 tablet, Rfl: 0 .  testosterone (ANDROGEL) 50 MG/5GM (1%) GEL, Place onto the skin daily., Disp: , Rfl:  .  vitamin E 400 UNIT capsule, Take 400 Units by mouth daily., Disp: , Rfl:  .  [START ON 11/22/2017] oxyCODONE (ROXICODONE) 15 MG immediate release tablet, Take 1 tablet (15 mg total) by mouth every 6 (six) hours as needed  for pain., Disp: 120 tablet, Rfl: 0  ROS  Constitutional: Denies any fever or chills Gastrointestinal: No reported hemesis, hematochezia, vomiting, or acute GI distress Musculoskeletal: Denies any acute onset joint swelling, redness, loss of ROM, or weakness Neurological: No reported episodes of acute onset apraxia, aphasia, dysarthria, agnosia, amnesia, paralysis, loss of coordination, or loss of consciousness  Allergies  Mr. Dollens is allergic to ibuprofen; sulfa antibiotics; sulfasalazine; and lidocaine.  [place]  Drug: Mr. Reason  reports that he does not use drugs. Alcohol:  reports that he does not drink alcohol. Tobacco:  reports that he has been smoking cigars. He has never used smokeless tobacco. Medical:  has a past medical history of Acute encephalopathy (12/08/2014), Anxiety, ARF (acute renal failure) ([place]) (12/08/2014), Back pain, Benign neoplasm of large bowel, Capsulitis, Chronic back pain, Depression, Diabetes mellitus without complication ([place]), Dysphagia, Exostosis, GERD (gastroesophageal reflux disease), Gout, Hypothyroidism, Insomnia, Low testosterone, Microscopic hematuria, Pneumonia (12/07/2014), Pressure ulcer (12/09/2014), Sepsis ([place]) (12/08/2014), and Thyroid disease. Surgical: Mr. Carn  has a past surgical history that includes Cholecystectomy; Appendectomy; Back surgery; Eye surgery; and Esophagogastroduodenoscopy (egd) with propofol (N/A, 04/24/2017). Family: family  history includes Diabetes in his father; Heart disease in his mother.  Constitutional Exam  General appearance: Well nourished, well developed, and well hydrated. In no apparent acute distress Vitals:   11/14/17 0932  BP: (!) 146/73  Pulse: 68  Resp: 18  Temp: 98.2 F (36.8 C)  SpO2: 95%  Weight: 189 lb (85.7 kg)  Height: _0  (1.803 m)   BMI Assessment: Estimated body mass index is 26.36 kg/m as calculated from the following:   Height as of this encounter: _1  (1.803 m).   Weight as of this  encounter: 189 lb (85.7 kg). Psych/Mental status: Alert, oriented x 3 (person, place, & time)       Eyes: PERLA Respiratory: No evidence of acute respiratory distress   Lumbar Spine Area Exam  Skin & Axial Inspection: No masses, redness, or swelling Alignment: Symmetrical Functional ROM: Unrestricted ROM       Stability: No instability detected Muscle Tone/Strength: Functionally intact. No obvious neuro-muscular anomalies detected. Sensory (Neurological): Unimpaired Palpation: Tender         Gait & Posture Assessment  Ambulation: Patient ambulates using a walker Gait: Relatively normal for age and body habitus Posture: WNL   Lower Extremity Exam    Side: Right lower extremity  Side: Left lower extremity  Stability: No instability observed          Stability: No instability observed          Skin & Extremity Inspection: Skin color, temperature, and hair growth are WNL. No peripheral edema or cyanosis. No masses, redness, swelling, asymmetry, or associated skin lesions. No contractures.  Skin & Extremity Inspection: Skin color, temperature, and hair growth are WNL. No peripheral edema or cyanosis. No masses, redness, swelling, asymmetry, or associated skin lesions. No contractures.  Functional ROM: Unrestricted ROM                  Functional ROM: Unrestricted ROM                  Muscle Tone/Strength: Functionally intact. No obvious neuro-muscular anomalies detected.  Muscle Tone/Strength: Functionally intact. No obvious neuro-muscular anomalies detected.  Sensory (Neurological): Unimpaired  Sensory (Neurological): Dermatomal pain pattern  Palpation: No palpable anomalies  Palpation: No palpable anomalies   Assessment  Primary Diagnosis & Pertinent Problem List: The primary encounter diagnosis was Lumbosacral radiculopathy at L5. Diagnoses of DDD (degenerative disc disease), lumbar, Chronic low back pain (2) (Bilateral) (ML) (L>R) with sciatica (Left), Chronic pain syndrome, and Long  term prescription opiate use were also pertinent to this visit.  Status Diagnosis  Controlled Controlled Controlled 1. Lumbosacral radiculopathy at L5   2. DDD (degenerative disc disease), lumbar   3. Chronic low back pain (2) (Bilateral) (ML) (L>R) with sciatica (Left)   4. Chronic pain syndrome   5. Long term prescription opiate use     Problems updated and reviewed during this visit: Problem  Arf (Acute Renal Failure) ([facility])  Pneumonia   Plan of Care  Pharmacotherapy (Medications Ordered): Meds ordered this encounter  Medications  . oxyCODONE (ROXICODONE) 15 MG immediate release tablet    Sig: Take 1 tablet (15 mg total) by mouth every 6 (six) hours as needed for pain.    Dispense:  120 tablet    Refill:  0    Do not place this medication, or any other prescription from our practice, on "Automatic Refill". Patient may have prescription filled one day early if pharmacy is closed on scheduled refill date.  Order Specific Question:   Supervising Provider    Answer:   Milinda Pointer [phone]  . oxyCODONE (ROXICODONE) 15 MG immediate release tablet    Sig: Take 1 tablet (15 mg total) by mouth every 6 (six) hours as needed for pain.    Dispense:  120 tablet    Refill:  0    Do not place this medication, or any other prescription from our practice, on "Automatic Refill". Patient may have prescription filled one day early if pharmacy is closed on scheduled refill date.    Order Specific Question:   Supervising Provider    Answer:   Milinda Pointer [[id number]]   New Prescriptions   No medications on file   Medications administered today: Evert Kohl. Tomaro had no medications administered during this visit. Lab-work, procedure(s), and/or referral(s): Orders Placed This Encounter  Procedures  . ToxASSURE Select 13 (MW), Urine  . Neurostimulator Trial   Imaging and/or referral(s): None  Interventional therapies: Planned, scheduled, and/or pending:   Not at this time.   Spinal cord stimulator approval only requested however appointment not placed.  Psychiatry referral ordered.  Patient was to try a drug holiday to taper down on dose current MME 90 once per day "Drug Holiday" in order to decrease his tolerance    Considering:   Diagnostic caudal epidural steroid injection + epidurogram Possible Racz procedure Diagnostic bilateral lumbar facet block Possible bilateral lumbar facet RFA Possible candidate for intrathecal pump trial and implant Possible candidate for spinal cord stimulator trial and implant   Palliative PRN treatment(s):   Diagnostic left-sided L4 and [id number]#2     Provider-requested follow-up: Return in about 2 months (around 01/14/2018) for MedMgmt.  Future Appointments  Date Time Provider [place]  11/29/2017  8:30 AM [facility]  01/16/2018  9:45 AM Vevelyn Francois, NP [facility] None   Primary Care Physician: Jodi Marble, MD Location: [facility] Outpatient Pain Management Facility Note by: Vevelyn Francois NP Date: 11/14/2017; Time: 4:28 PM  Pain Score Disclaimer: We use the NRS-11 scale. This is a self-reported, subjective measurement of pain severity with only modest accuracy. It is used primarily to identify changes within a particular patient. It must be understood that outpatient pain scales are significantly less accurate that those used for research, where they can be applied under ideal controlled circumstances with minimal exposure to variables. In reality, the score is likely to be a combination of pain intensity and pain affect, where pain affect describes the degree of emotional arousal or changes in action readiness caused by the sensory experience of pain. Factors such as social and work situation, setting, emotional state, anxiety levels, expectation, and prior pain experience may influence pain perception and show large inter-individual differences that may also be affected by time  variables.  Patient instructions provided during this appointment: Patient Instructions  _You have been given 2 electronic prescriptions to last until 01/21/18. ___________________________________________________________________________________________  Medication Rules  Applies to: All patients receiving prescriptions (written or electronic).  Pharmacy of record: Pharmacy where electronic prescriptions will be sent. If written prescriptions are taken to a different pharmacy, please inform the nursing staff. The pharmacy listed in the electronic medical record should be the one where you would like electronic prescriptions to be sent.  Prescription refills: Only during scheduled appointments. Applies to both, written and electronic prescriptions.  NOTE: The following applies primarily to controlled substances (Opioid* Pain Medications).   Patient's responsibilities: 1. Pain Pills: Bring all pain pills to every  appointment (except for procedure appointments). 2. Pill Bottles: Bring pills in original pharmacy bottle. Always bring newest bottle. Bring bottle, even if empty. 3. Medication refills: You are responsible for knowing and keeping track of what medications you need refilled. The day before your appointment, write a list of all prescriptions that need to be refilled. Bring that list to your appointment and give it to the admitting nurse. Prescriptions will be written only during appointments. If you forget a medication, it will not be "Called in", "Faxed", or "electronically sent". You will need to get another appointment to get these prescribed. 4. Prescription Accuracy: You are responsible for carefully inspecting your prescriptions before leaving our office. Have the discharge nurse carefully go over each prescription with you, before taking them home. Make sure that your name is accurately spelled, that your address is correct. Check the name and dose of your medication to make sure it  is accurate. Check the number of pills, and the written instructions to make sure they are clear and accurate. Make sure that you are given enough medication to last until your next medication refill appointment. 5. Taking Medication: Take medication as prescribed. Never take more pills than instructed. Never take medication more frequently than prescribed. Taking less pills or less frequently is permitted and encouraged, when it comes to controlled substances (written prescriptions).  6. Inform other Doctors: Always inform, all of your healthcare providers, of all the medications you take. 7. Pain Medication from other Providers: You are not allowed to accept any additional pain medication from any other Doctor or Healthcare provider. There are two exceptions to this rule. (see below) In the event that you require additional pain medication, you are responsible for notifying us, as stated below. 8. Medication Agreement: You are responsible for carefully reading and following our Medication Agreement. This must be signed before receiving any prescriptions from our practice. Safely store a copy of your signed Agreement. Violations to the Agreement will result in no further prescriptions. (Additional copies of our Medication Agreement are available upon request.) 9. Laws, Rules, & Regulations: All patients are expected to follow all Federal and [employer], [employer], Rules, [employer]. Ignorance of the Laws does not constitute a valid excuse. The use of any illegal substances is prohibited. 10. Adopted CDC guidelines & recommendations: Target dosing levels will be at or below 60 MME/day. Use of benzodiazepines** is not recommended.  Exceptions: There are only two exceptions to the rule of not receiving pain medications from other Healthcare Providers. 1. Exception #1 (Emergencies): In the event of an emergency (i.e.: accident requiring emergency care), you are allowed to receive additional pain medication.  However, you are responsible for: As soon as you are able, call our office [phone], at any time of the day or night, and leave a message stating your name, the date and nature of the emergency, and the name and dose of the medication prescribed. In the event that your call is answered by a member of our staff, make sure to document and save the date, time, and the name of the person that took your information.  2. Exception #2 (Planned Surgery): In the event that you are scheduled by another doctor or dentist to have any type of surgery or procedure, you are allowed (for a period no longer than 30 days), to receive additional pain medication, for the acute post-op pain. However, in this case, you are responsible for picking up a copy of our "Post-op Pain Management for  Surgeons" handout, and giving it to your surgeon or dentist. This document is available at our office, and does not require an appointment to obtain it. Simply go to our office during business hours (Monday-Thursday from 8:00 AM to 4:00 PM) (Friday 8:00 AM to 12:00 Noon) or if you have a scheduled appointment with [place], prior to your surgery, and ask for it by name. In addition, you will need to provide [place] with your name, name of your surgeon, type of surgery, and date of procedure or surgery.  *Opioid medications include: morphine, codeine, oxycodone, oxymorphone, hydrocodone, hydromorphone, meperidine, tramadol, tapentadol, buprenorphine, fentanyl, methadone. **Benzodiazepine medications include: diazepam (Valium), alprazolam (Xanax), clonazepam (Klonopine), lorazepam (Ativan), clorazepate (Tranxene), chlordiazepoxide (Librium), estazolam (Prosom), oxazepam (Serax), temazepam (Restoril), triazolam (Halcion) (Last updated: 04/05/2017) ____________________________________________________________________________________________

## 2017-11-14 NOTE — Patient Instructions (Addendum)
_You have been given 2 electronic prescriptions to last until 01/21/18. ___________________________________________________________________________________________  Medication Rules  Applies to: All patients receiving prescriptions (written or electronic).  Pharmacy of record: Pharmacy where electronic prescriptions will be sent. If written prescriptions are taken to a different pharmacy, please inform the nursing staff. The pharmacy listed in the electronic medical record should be the one where you would like electronic prescriptions to be sent.  Prescription refills: Only during scheduled appointments. Applies to both, written and electronic prescriptions.  NOTE: The following applies primarily to controlled substances (Opioid* Pain Medications).   Patient's responsibilities: 1. Pain Pills: Bring all pain pills to every appointment (except for procedure appointments). 2. Pill Bottles: Bring pills in original pharmacy bottle. Always bring newest bottle. Bring bottle, even if empty. 3. Medication refills: You are responsible for knowing and keeping track of what medications you need refilled. The day before your appointment, write a list of all prescriptions that need to be refilled. Bring that list to your appointment and give it to the admitting nurse. Prescriptions will be written only during appointments. If you forget a medication, it will not be "Called in", "Faxed", or "electronically sent". You will need to get another appointment to get these prescribed. 4. Prescription Accuracy: You are responsible for carefully inspecting your prescriptions before leaving our office. Have the discharge nurse carefully go over each prescription with you, before taking them home. Make sure that your name is accurately spelled, that your address is correct. Check the name and dose of your medication to make sure it is accurate. Check the number of pills, and the written instructions to make sure they are  clear and accurate. Make sure that you are given enough medication to last until your next medication refill appointment. 5. Taking Medication: Take medication as prescribed. Never take more pills than instructed. Never take medication more frequently than prescribed. Taking less pills or less frequently is permitted and encouraged, when it comes to controlled substances (written prescriptions).  6. Inform other Doctors: Always inform, all of your healthcare providers, of all the medications you take. 7. Pain Medication from other Providers: You are not allowed to accept any additional pain medication from any other Doctor or Healthcare provider. There are two exceptions to this rule. (see below) In the event that you require additional pain medication, you are responsible for notifying us, as stated below. 8. Medication Agreement: You are responsible for carefully reading and following our Medication Agreement. This must be signed before receiving any prescriptions from our practice. Safely store a copy of your signed Agreement. Violations to the Agreement will result in no further prescriptions. (Additional copies of our Medication Agreement are available upon request.) 9. Laws, Rules, & Regulations: All patients are expected to follow all Federal and [employer], Rules, [employer]. Ignorance of the Laws does not constitute a valid excuse. The use of any illegal substances is prohibited. 10. Adopted CDC guidelines & recommendations: Target dosing levels will be at or below 60 MME/day. Use of benzodiazepines** is not recommended.  Exceptions: There are only two exceptions to the rule of not receiving pain medications from other Healthcare Providers. 1. Exception #1 (Emergencies): In the event of an emergency (i.e.: accident requiring emergency care), you are allowed to receive additional pain medication. However, you are responsible for: As soon as you are able, call our office [phone],  at any time of the day or night, and leave a message stating your name, the date and nature  of the emergency, and the name and dose of the medication prescribed. In the event that your call is answered by a member of our staff, make sure to document and save the date, time, and the name of the person that took your information.  2. Exception #2 (Planned Surgery): In the event that you are scheduled by another doctor or dentist to have any type of surgery or procedure, you are allowed (for a period no longer than 30 days), to receive additional pain medication, for the acute post-op pain. However, in this case, you are responsible for picking up a copy of our "Post-op Pain Management for Surgeons" handout, and giving it to your surgeon or dentist. This document is available at our office, and does not require an appointment to obtain it. Simply go to our office during business hours (Monday-Thursday from 8:00 AM to 4:00 PM) (Friday 8:00 AM to 12:00 Noon) or if you have a scheduled appointment with [place], prior to your surgery, and ask for it by name. In addition, you will need to provide [place] with your name, name of your surgeon, type of surgery, and date of procedure or surgery.  *Opioid medications include: morphine, codeine, oxycodone, oxymorphone, hydrocodone, hydromorphone, meperidine, tramadol, tapentadol, buprenorphine, fentanyl, methadone. **Benzodiazepine medications include: diazepam (Valium), alprazolam (Xanax), clonazepam (Klonopine), lorazepam (Ativan), clorazepate (Tranxene), chlordiazepoxide (Librium), estazolam (Prosom), oxazepam (Serax), temazepam (Restoril), triazolam (Halcion) (Last updated: 04/05/2017) ____________________________________________________________________________________________

## 2017-11-18 LAB — TOXASSURE SELECT 13 (MW), URINE

## 2017-11-26 ENCOUNTER — Other Ambulatory Visit: Payer: Self-pay

## 2017-11-26 ENCOUNTER — Encounter: Payer: Self-pay | Admitting: Emergency Medicine

## 2017-11-26 ENCOUNTER — Emergency Department
Admission: EM | Admit: 2017-11-26 | Discharge: 2017-11-26 | Disposition: A | Discharge status: Home / Self Care | Payer: Medicare Other | Attending: Emergency Medicine | Admitting: Emergency Medicine

## 2017-11-26 ENCOUNTER — Emergency Department: Payer: Medicare Other

## 2017-11-26 DIAGNOSIS — F17290 Nicotine dependence, other tobacco product, uncomplicated: Secondary | ICD-10-CM | POA: Insufficient documentation

## 2017-11-26 DIAGNOSIS — S92502A Displaced unspecified fracture of left lesser toe(s), initial encounter for closed fracture: Secondary | ICD-10-CM

## 2017-11-26 DIAGNOSIS — Y92018 Other place in single-family (private) house as the place of occurrence of the external cause: Secondary | ICD-10-CM | POA: Insufficient documentation

## 2017-11-26 DIAGNOSIS — E119 Type 2 diabetes mellitus without complications: Secondary | ICD-10-CM | POA: Insufficient documentation

## 2017-11-26 DIAGNOSIS — L03032 Cellulitis of left toe: Secondary | ICD-10-CM | POA: Diagnosis not present

## 2017-11-26 DIAGNOSIS — M21372 Foot drop, left foot: Secondary | ICD-10-CM | POA: Diagnosis not present

## 2017-11-26 DIAGNOSIS — Z7982 Long term (current) use of aspirin: Secondary | ICD-10-CM | POA: Insufficient documentation

## 2017-11-26 DIAGNOSIS — Z79899 Other long term (current) drug therapy: Secondary | ICD-10-CM | POA: Insufficient documentation

## 2017-11-26 DIAGNOSIS — S61511D Laceration without foreign body of right wrist, subsequent encounter: Secondary | ICD-10-CM | POA: Insufficient documentation

## 2017-11-26 DIAGNOSIS — D126 Benign neoplasm of colon, unspecified: Secondary | ICD-10-CM | POA: Diagnosis not present

## 2017-11-26 DIAGNOSIS — Y999 Unspecified external cause status: Secondary | ICD-10-CM | POA: Diagnosis not present

## 2017-11-26 DIAGNOSIS — S41109A Unspecified open wound of unspecified upper arm, initial encounter: Secondary | ICD-10-CM

## 2017-11-26 DIAGNOSIS — E039 Hypothyroidism, unspecified: Secondary | ICD-10-CM | POA: Insufficient documentation

## 2017-11-26 DIAGNOSIS — S99922A Unspecified injury of left foot, initial encounter: Secondary | ICD-10-CM | POA: Diagnosis present

## 2017-11-26 DIAGNOSIS — X58XXXA Exposure to other specified factors, initial encounter: Secondary | ICD-10-CM | POA: Insufficient documentation

## 2017-11-26 DIAGNOSIS — Y9301 Activity, walking, marching and hiking: Secondary | ICD-10-CM | POA: Insufficient documentation

## 2017-11-26 LAB — GLUCOSE, CAPILLARY: Glucose-Capillary: 136 mg/dL — ABNORMAL HIGH (ref 70–99)

## 2017-11-26 MED ORDER — CLINDAMYCIN HCL 300 MG PO CAPS: 300.0000 mg | ORAL_CAPSULE | Freq: Three times a day (TID) | ORAL | 0 refills | Status: AC

## 2017-11-26 NOTE — ED Notes (Signed)
See triage note.  Patient in nad. Has fam member at bedside.

## 2017-11-26 NOTE — ED Triage Notes (Signed)
States cut R wrist on walker on 10/9, small reddened wound not healed yet. 2nd toe pain, originally injured 10/3, has injured twice since,

## 2017-11-26 NOTE — ED Provider Notes (Signed)
[facility] Emergency Department Provider Note  ____________________________________________   First MD Initiated Contact with Patient 11/26/17 1149     (approximate)  I have reviewed the triage vital signs and the nursing notes.   HISTORY  Chief Complaint Laceration and Foot Pain   HPI Chase Mendez is a 83 y.o. male presents to the ED with complaint of a cut to his right wrist that occurred on 10/9 which is not healed.  He also had an injury to his second toe on his left foot on 10/3 and was sutured.  Wife states that he recently reinjured his left foot by walking barefoot on the porch 2 days ago.  Wife states that he saw his PCP at which time Dermabond was used on his right hand but has not healed.  She denies any drainage from the area.  Patient has not had any fever.  He states that his PCP took him off of his metformin and that his fingersticks have been well within normal at home.  Patient attributes his multiple injuries to his left foot due to his dropfoot.  Patient denies any actual fall.  Wife states that he frequently walks in the house with no shoes on.  Patient rates his pain as 6/10.   Past Medical History:  Diagnosis Date  . Acute encephalopathy 12/08/2014  . Anxiety   . ARF (acute renal failure) ([place]) 12/08/2014  . Back pain   . Benign neoplasm of large bowel   . Capsulitis    fractured displaced metatarsal with capsulitis  . Chronic back pain   . Depression   . Diabetes mellitus without complication ([place])   . Dysphagia   . Exostosis    painful, right hallux  . GERD (gastroesophageal reflux disease)   . Gout   . Hypothyroidism   . Insomnia   . Low testosterone   . Microscopic hematuria   . Pneumonia 12/07/2014  . Pressure ulcer 12/09/2014  . Sepsis ([place]) 12/08/2014  . Thyroid disease     Patient Active Problem List   Diagnosis Date Noted  . Chronic pain syndrome 07/11/2017  . DDD (degenerative disc disease), lumbar 06/19/2017    . Opioid Drug tolerance 06/10/2017  . History of acute renal failure (12/08/2014) 06/10/2017  . History of encephalopathy (12/08/2014) 06/10/2017  . History of sepsis (12/08/2014) 06/10/2017  . Lower limb pain, inferior, left (L5) ([place]) 05/16/2017  . Chronic low back pain (2) (Bilateral) (ML) (L>R) with sciatica (Left) 05/16/2017  . Chronic lower extremity pain (1) (Left) 05/16/2017  . 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 Foot drop (Left) 05/16/2017  . Lumbosacral radiculopathy at L5 05/16/2017  . Disorder of skeletal system 05/16/2017  . Pharmacologic therapy 05/16/2017  . Problems influencing health status 05/16/2017  . Venous reflux 07/12/2016  . 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 Varicose veins of both lower extremities with pain 05/09/2016  . Lower extremity pain, bilateral 05/09/2016  . ARF (acute renal failure) ([place]) 12/08/2014  . Pneumonia 12/07/2014    Past Surgical History:  Procedure Laterality Date  . APPENDECTOMY    . BACK SURGERY    . CHOLECYSTECTOMY    . ESOPHAGOGASTRODUODENOSCOPY (EGD) WITH PROPOFOL N/A 04/24/2017   Procedure: ESOPHAGOGASTRODUODENOSCOPY (EGD) WITH PROPOFOL;  Surgeon: Manya Silvas, MD;  Location: [facility] ENDOSCOPY;  Service: Endoscopy;  Laterality: N/A;  . EYE SURGERY      Prior to Admission medications   Medication Sig Start Date End Date Taking? Authorizing Provider  allopurinol (ZYLOPRIM) 100 MG tablet Take 100 mg by mouth daily. 10/26/14   [provider]  aspirin EC 81 MG tablet Take 81 mg by mouth daily.    [provider]  atorvastatin (LIPITOR) 10 MG tablet Take 10 mg by mouth every evening. 04/08/16   [provider]  citalopram (CELEXA) 20 MG tablet Take 20 mg by mouth daily.    [provider]  clindamycin (CLEOCIN) 300 MG capsule Take 1 capsule (300 mg total) by mouth 3 (three) times daily for 10 days. 11/26/17 12/06/17  Johnn Hai, PA-C  Cyanocobalamin 1000 MCG/ML LIQD Take  1,000 mg by mouth daily.    [provider]  diphenhydrAMINE (BENADRYL) 25 MG tablet Take 25 mg by mouth every 6 (six) hours as needed.    [provider]  fluticasone (FLONASE) 50 MCG/ACT nasal spray fluticasone propionate 50 mcg/act susp    [provider]  gabapentin (NEURONTIN) 400 MG capsule Take 400 mg by mouth 3 (three) times daily.    [provider]  isosorbide dinitrate (ISORDIL) 30 MG tablet Take 30 mg by mouth 3 (three) times daily.    [provider]  levothyroxine (SYNTHROID, LEVOTHROID) 150 MCG tablet Take 150 mcg by mouth every morning. 09/12/14   [provider]  linaclotide (LINZESS) 290 MCG CAPS capsule Take 290 mcg by mouth daily before breakfast.    [provider]  losartan (COZAAR) 50 MG tablet Take 50 mg by mouth daily.    [provider]  mirabegron ER (MYRBETRIQ) 25 MG TB24 tablet Take 25 mg by mouth daily.    [provider]  naloxone [facility]) nasal spray 4 mg/0.1 mL Spray into one nostril. Repeat with second device into other nostril after 2-3 minutes if no or minimal response. Use in case of opioid overdose. 09/04/17   Vevelyn Francois, NP  omeprazole (PRILOSEC) 20 MG capsule Take 20 mg by mouth daily.    [provider]  ondansetron (ZOFRAN ODT) 4 MG disintegrating tablet Take 1 tablet (4 mg total) by mouth every 8 (eight) hours as needed for nausea or vomiting. 08/21/16   Darel Hong, MD  oxyCODONE (ROXICODONE) 15 MG immediate release tablet Take 1 tablet (15 mg total) by mouth every 6 (six) hours as needed for pain. 12/22/17 01/21/18  Vevelyn Francois, NP  oxyCODONE (ROXICODONE) 15 MG immediate release tablet Take 1 tablet (15 mg total) by mouth every 6 (six) hours as needed for pain. 11/22/17 12/22/17  Vevelyn Francois, NP  testosterone (ANDROGEL) 50 MG/5GM (1%) GEL Place onto the skin daily.    [provider]  vitamin E 400 UNIT capsule Take 400 Units by mouth daily.     [provider]    Allergies Ibuprofen; Sulfa antibiotics; Sulfasalazine; and Lidocaine  Family History  Problem Relation Age of Onset  . Heart disease Mother   . Diabetes Father     Social History Social History   Tobacco Use  . Smoking status: Current Every Day Smoker    Types: Cigars  . Smokeless tobacco: Never Used  Substance Use Topics  . Alcohol use: No  . Drug use: No    Review of Systems Constitutional: No fever/chills Cardiovascular: Denies chest pain. Respiratory: Denies shortness of breath. Musculoskeletal: Left second toe pain. Skin: Nonhealing wound right wrist and left second toe. Neurological: Negative for headaches, focal weakness or numbness. ____________________________________________   PHYSICAL EXAM:  VITAL SIGNS: ED Triage Vitals  Enc Vitals Group  BP 11/26/17 1059 (!) 156/73     Pulse Rate 11/26/17 1059 71     Resp 11/26/17 1059 18     Temp 11/26/17 1059 98.4 F (36.9 C)     Temp Source 11/26/17 1059 Oral     SpO2 11/26/17 1059 94 %     Weight 11/26/17 1100 193 lb (87.5 kg)     Height 11/26/17 1100 6' (1.829 m)     Head Circumference --      Peak Flow --      Pain Score 11/26/17 1100 6     Pain Loc --      Pain Edu? --      Excl. in [place]? --     Constitutional: Alert and oriented. Well appearing and in no acute distress. Eyes: Conjunctivae are normal. PERRL. EOMI. Head: Atraumatic. Neck: No stridor.   Cardiovascular: Normal rate, regular rhythm. Grossly normal heart sounds.  Good peripheral circulation. Respiratory: Normal respiratory effort.  No retractions. Lungs CTAB. Musculoskeletal: Examination of the left foot there is moderate edema of the second digit with erythema and tenderness.  Skin feels warm to touch.  There is also an open area with purulent drainage.  Area is superficial on examination.  No active bleeding at this time.  Also on examination of the right wrist volar aspect there is a old laceration and a  circular shape without evidence of infection.  Wound dehiscence in the area.  Patient is able to move wrist without any difficulty or restrictions.  No drainage seen. Neurologic:  Normal speech and language. No gross focal neurologic deficits are appreciated. No gait instability. Skin:  Skin is warm, dry.  As noted above. Psychiatric: Mood and affect are normal. Speech and behavior are normal.  ____________________________________________   LABS (all labs ordered are listed, but only abnormal results are displayed)  Labs Reviewed  GLUCOSE, CAPILLARY - Abnormal; Notable for the following components:      Result Value   Glucose-Capillary 136 (*)    All other components within normal limits  CBG MONITORING, ED    RADIOLOGY  ED MD interpretation:  X-ray left foot with reported nondisplaced fracture of the  DIP joint second digit.  Official radiology report(s): Dg Foot Complete Left  Result Date: 11/26/2017 CLINICAL DATA:  83 year old male with 2nd toe pain after injury last night and earlier this month. EXAM: LEFT FOOT - COMPLETE 3+ VIEW COMPARISON:  11/08/2017 right foot series FINDINGS: Diastasis of the left 2nd DIP joint. Possible nondisplaced intra-articular fracture along the lateral aspect of the base of the 2nd distal phalanx. No definite fracture of the 2nd middle phalanx. Elsewhere stable radiographic appearance of the left foot tarsal bones appear intact and normally aligned. IMPRESSION: 1. Diastasis of the left 2nd DIP joint with possible nondisplaced intra-articular fracture at the lateral base the distal phalanx. 2. No other acute fracture or dislocation identified about the left foot. Electronically Signed   By: Genevie Ann M.D.   On: 11/26/2017 12:38    ____________________________________________   PROCEDURES  Procedure(s) performed: None  Procedures  Critical Care performed: No  ____________________________________________   INITIAL IMPRESSION / ASSESSMENT AND  PLAN / ED COURSE  As part of my medical decision making, I reviewed the following data within the electronic MEDICAL RECORD NUMBER Notes from prior ED visits and [place] Controlled Substance Database  Patient presents to the ED with complaint of new injury to his left foot with history of multiple injuries to the same area in  the past.  He also has an injury to his right wrist/palm that was Dermabond at his doctor's office which is now dehisced.  Right wrist without any evidence of infection.  There is an injury to the left second toe with x-ray revealing a nondisplaced fracture of the DIP joint.  Area also has an open wound that appears to be superficial.  Erythema and swelling is secondary to fracture versus cellulitis.  Patient was placed on clindamycin to cover infection on his left second toe.  Restore dressing was applied to the right wrist.  Patient was given information about the wound care center with instructions to call today to make an appointment.  He is to leave a restore dressing on for 7 days.  Patient and spouse are aware that he will need to return to the emergency department if any severe worsening of his foot or fever and chills.  Patient was strongly advised to wear shoes instead of going barefoot in his house.  ____________________________________________   FINAL CLINICAL IMPRESSION(S) / ED DIAGNOSES  Final diagnoses:  Closed fracture of phalanx of left second toe, initial encounter  Non-healing wound of upper extremity, initial encounter  Cellulitis of second toe of left foot     ED Discharge Orders         Ordered    clindamycin (CLEOCIN) 300 MG capsule  3 times daily     11/26/17 1313           Note:  This document was prepared using Dragon voice recognition software and may include unintentional dictation errors.    Johnn Hai, PA-C 11/26/17 1809    Harvest Dark, MD 11/27/17 [phone]

## 2017-11-26 NOTE — ED Notes (Signed)
Dry gauze to toe wounds.

## 2017-11-26 NOTE — Discharge Instructions (Signed)
Call make an appointment at the wound care center.  Their information is listed on your discharge papers along with the telephone number.  Leave the restore dressing on your right hand for 7 days.  Do not remove.  Also clean your left second toe daily with mild soap and water.  You will need to wear your postop shoe anytime you are up walking as this will provide protection and support for your second toe.  Begin taking clindamycin 300 mg 3 times per day for infection.  Return to the emergency department if any severe worsening or fever.  You should plan on following up with the wound care center with their first appointment.

## 2017-11-28 ENCOUNTER — Encounter: Payer: Medicare Other | Attending: Internal Medicine | Admitting: Internal Medicine

## 2017-11-28 DIAGNOSIS — M21372 Foot drop, left foot: Secondary | ICD-10-CM | POA: Diagnosis not present

## 2017-11-28 DIAGNOSIS — M5416 Radiculopathy, lumbar region: Secondary | ICD-10-CM | POA: Insufficient documentation

## 2017-11-28 DIAGNOSIS — E1140 Type 2 diabetes mellitus with diabetic neuropathy, unspecified: Secondary | ICD-10-CM | POA: Insufficient documentation

## 2017-11-28 DIAGNOSIS — Z8249 Family history of ischemic heart disease and other diseases of the circulatory system: Secondary | ICD-10-CM | POA: Diagnosis not present

## 2017-11-28 DIAGNOSIS — L03116 Cellulitis of left lower limb: Secondary | ICD-10-CM | POA: Diagnosis not present

## 2017-11-28 DIAGNOSIS — Z886 Allergy status to analgesic agent status: Secondary | ICD-10-CM | POA: Insufficient documentation

## 2017-11-28 DIAGNOSIS — S91115A Laceration without foreign body of left lesser toe(s) without damage to nail, initial encounter: Secondary | ICD-10-CM | POA: Insufficient documentation

## 2017-11-28 DIAGNOSIS — E039 Hypothyroidism, unspecified: Secondary | ICD-10-CM | POA: Diagnosis not present

## 2017-11-28 DIAGNOSIS — Z882 Allergy status to sulfonamides status: Secondary | ICD-10-CM | POA: Insufficient documentation

## 2017-11-28 DIAGNOSIS — F17200 Nicotine dependence, unspecified, uncomplicated: Secondary | ICD-10-CM | POA: Insufficient documentation

## 2017-11-28 DIAGNOSIS — W19XXXA Unspecified fall, initial encounter: Secondary | ICD-10-CM | POA: Diagnosis not present

## 2017-11-28 DIAGNOSIS — S61401A Unspecified open wound of right hand, initial encounter: Secondary | ICD-10-CM | POA: Diagnosis not present

## 2017-11-29 ENCOUNTER — Ambulatory Visit
Admission: RE | Admit: 2017-11-29 | Discharge: 2017-11-29 | Disposition: A | Discharge status: Home / Self Care | Payer: Medicare Other | Source: Ambulatory Visit | Attending: Student | Admitting: Student

## 2017-11-29 DIAGNOSIS — R110 Nausea: Secondary | ICD-10-CM | POA: Diagnosis present

## 2017-11-29 DIAGNOSIS — R6881 Early satiety: Secondary | ICD-10-CM | POA: Diagnosis not present

## 2017-11-29 MED ORDER — TECHNETIUM TC 99M SULFUR COLLOID
2.0000 | Freq: Once | INTRAVENOUS | Status: AC | PRN
  Administered 2017-11-29: 2.58 via INTRAVENOUS

## 2017-11-29 NOTE — Progress Notes (Signed)
Chase, Mendez ([id number]) Visit Report for 11/28/2017 Abuse/Suicide Risk Screen Details Patient Name: Chase Mendez, Chase Mendez Date of Service: 11/28/2017 10:30 AM Medical Record Number: [id number] Patient Account Number: [id number] Date of Birth/Sex: 1934/04/10 (83 y.o. M) Treating RN: Secundino Ginger Primary Care Karmyn Lowman: Pernell Dupre Other Clinician: Referring Alaisa Moffitt: Harvest Dark Treating Todrick Siedschlag/Extender: Tito Dine in Treatment: 0 Abuse/Suicide Risk Screen Items Answer ABUSE/SUICIDE RISK SCREEN: Has anyone close to you tried to hurt or harm you recentlyo No Do you feel uncomfortable with anyone in your familyo No Has anyone forced you do things that you didnot want to doo No Do you have any thoughts of harming yourselfo No Patient displays signs or symptoms of abuse and/or neglect. No Electronic Signature(s) Signed: 11/28/2017 4:05:05 PM By: Secundino Ginger Entered By: Secundino Ginger on 11/28/2017 11:13:47 Chase Mendez ([id number]) -------------------------------------------------------------------------------- Activities of Daily Living Details Patient Name: Chase Mendez Date of Service: 11/28/2017 10:30 AM Medical Record Number: [id number] Patient Account Number: [id number] Date of Birth/Sex: 1934/11/24 (83 y.o. M) Treating RN: Secundino Ginger Primary Care Juventino Pavone: Pernell Dupre Other Clinician: Referring Lastacia Solum: Harvest Dark Treating Shields Pautz/Extender: Tito Dine in Treatment: 0 Activities of Daily Living Items Answer Activities of Daily Living (Please select one for each item) Drive Automobile Completely Able Take Medications Completely Able Use Telephone Completely Able Care for Appearance Completely Able Use Toilet Completely Able Bath / Shower Completely Able Dress Self Completely Able Feed Self Completely Able Walk Completely Able Get In / Out Bed Completely Able Housework Completely Able Prepare Meals Completely Able Handle  Money Completely Able Shop for Self Completely Able Electronic Signature(s) Signed: 11/28/2017 4:05:05 PM By: Secundino Ginger Entered By: Secundino Ginger on 11/28/2017 11:14:59 Chase Mendez ([id number]) -------------------------------------------------------------------------------- Education Assessment Details Patient Name: Chase Mendez Date of Service: 11/28/2017 10:30 AM Medical Record Number: [id number] Patient Account Number: [id number] Date of Birth/Sex: 11-14-1934 (83 y.o. M) Treating RN: Secundino Ginger Primary Care Shernita Rabinovich: Pernell Dupre Other Clinician: Referring Aariel Ems: Harvest Dark Treating Antwain Caliendo/Extender: Tito Dine in Treatment: 0 Primary Learner Assessed: Patient Learning Preferences/Education Level/Primary Language Learning Preference: Explanation, Demonstration Highest Education Level: High School Cognitive Barrier Assessment/Beliefs Language Barrier: No Translator Needed: No Memory Deficit: No Emotional Barrier: No Cultural/Religious Beliefs Affecting Medical Care: No Physical Barrier Assessment Impaired Vision: No Impaired Hearing: No Decreased Hand dexterity: No Knowledge/Comprehension Assessment Knowledge Level: High Comprehension Level: High Ability to understand written High instructions: Ability to understand verbal High instructions: Motivation Assessment Anxiety Level: Calm Cooperation: Cooperative Education Importance: Acknowledges Need Interest in Health Problems: Asks Questions Perception: Coherent Willingness to Engage in Self- High Management Activities: Readiness to Engage in Self- High Management Activities: Electronic Signature(s) Signed: 11/28/2017 4:05:05 PM By: Secundino Ginger Entered By: Secundino Ginger on 11/28/2017 11:16:29 ARHAAN, CHESNUT ([id number]) -------------------------------------------------------------------------------- Fall Risk Assessment Details Patient Name: Chase Mendez Date of Service:  11/28/2017 10:30 AM Medical Record Number: [id number] Patient Account Number: [id number] Date of Birth/Sex: 11-Jan-1935 (83 y.o. M) Treating RN: Secundino Ginger Primary Care Zhaire Locker: Pernell Dupre Other Clinician: Referring Jamail Cullers: Harvest Dark Treating Aven Cegielski/Extender: Tito Dine in Treatment: 0 Fall Risk Assessment Items Have you had 2 or more falls in the last 12 monthso 0 Yes Have you had any fall that resulted in injury in the last 12 monthso 0 Yes FALL RISK ASSESSMENT: History of falling - immediate or within 3 months 25 Yes Secondary diagnosis 0 No Ambulatory aid None/bed rest/wheelchair/nurse 0 No Crutches/cane/walker 15 Yes Furniture 30 Yes IV Access/Saline  Lock 0 No Gait/Training Normal/bed rest/immobile 0 Yes Weak 10 Yes Impaired 20 Yes Mental Status Oriented to own ability 0 Yes Electronic Signature(s) Signed: 11/28/2017 4:05:05 PM By: Secundino Ginger Entered By: Secundino Ginger on 11/28/2017 11:17:27 Chase Mendez ([id number]) -------------------------------------------------------------------------------- Foot Assessment Details Patient Name: Chase Mendez Date of Service: 11/28/2017 10:30 AM Medical Record Number: [id number] Patient Account Number: [id number] Date of Birth/Sex: 06-23-1934 (83 y.o. M) Treating RN: Secundino Ginger Primary Care Jerriann Schrom: Pernell Dupre Other Clinician: Referring Adamari Frede: Harvest Dark Treating Keison Glendinning/Extender: Tito Dine in Treatment: 0 Foot Assessment Items Site Locations + = Sensation present, - = Sensation absent, C = Callus, U = Ulcer R = Redness, W = Warmth, M = Maceration, PU = Pre-ulcerative lesion F = Fissure, S = Swelling, D = Dryness Assessment Right: Left: Other Deformity: No No Prior Foot Ulcer: No No Prior Amputation: No No Charcot Joint: No No Ambulatory Status: Ambulatory With Help Assistance Device: Walker Gait: Steady Electronic Signature(s) Signed: 11/28/2017 4:05:05 PM  By: Secundino Ginger Entered By: Secundino Ginger on 11/28/2017 11:21:20 Chase Mendez ([id number]) -------------------------------------------------------------------------------- Nutrition Risk Assessment Details Patient Name: Chase Mendez Date of Service: 11/28/2017 10:30 AM Medical Record Number: [id number] Patient Account Number: [id number] Date of Birth/Sex: 1934/05/23 (83 y.o. M) Treating RN: Secundino Ginger Primary Care Tiffney Haughton: Pernell Dupre Other Clinician: Referring Bill Yohn: Harvest Dark Treating Dysen Edmondson/Extender: Tito Dine in Treatment: 0 Height (in): 72 Weight (lbs): 192.7 Body Mass Index (BMI): 26.1 Nutrition Risk Assessment Items NUTRITION RISK SCREEN: I have an illness or condition that made me change the kind and/or amount of 0 No food I eat I eat fewer than two meals per day 0 No I eat few fruits and vegetables, or milk products 0 No I have three or more drinks of beer, liquor or wine almost every day 0 No I have tooth or mouth problems that make it hard for me to eat 0 No I don't always have enough money to buy the food I need 0 No I eat alone most of the time 0 No I take three or more different prescribed or over-the-counter drugs a day 0 No Without wanting to, I have lost or gained 10 pounds in the last six months 0 No I am not always physically able to shop, cook and/or feed myself 0 No Nutrition Protocols Good Risk Protocol Moderate Risk Protocol Electronic Signature(s) Signed: 11/28/2017 4:05:05 PM By: Secundino Ginger Entered By: Secundino Ginger on 11/28/2017 11:17:52

## 2017-11-30 NOTE — Progress Notes (Signed)
ELRIDGE, STEMM ([id number]) Visit Report for 11/28/2017 Allergy List Details Patient Name: NEMIAH, KISSNER Date of Service: 11/28/2017 10:30 AM Medical Record Number: [id number] Patient Account Number: [id number] Date of Birth/Sex: 1934/05/21 (83 y.o. M) Treating RN: Secundino Ginger Primary Care Domitila Stetler: Pernell Dupre Other Clinician: Referring Dacoda Finlay: Harvest Dark Treating Jazmin Vensel/Extender: Ricard Dillon Weeks in Treatment: 0 Allergies Active Allergies Sulfa (Sulfonamide Antibiotics) ibuprofen Allergy Notes Electronic Signature(s) Signed: 11/28/2017 4:05:05 PM By: Secundino Ginger Entered By: Secundino Ginger on 11/28/2017 10:55:57 [name] ([id number]) -------------------------------------------------------------------------------- Arrival Information Details Patient Name: [name] Date of Service: 11/28/2017 10:30 AM Medical Record Number: [id number] Patient Account Number: [id number] Date of Birth/Sex: 04-22-1934 (83 y.o. M) Treating RN: Secundino Ginger Primary Care Kattia Selley: Pernell Dupre Other Clinician: Referring Murline Weigel: Harvest Dark Treating Davionte Lusby/Extender: Tito Dine in Treatment: 0 Visit Information Patient Arrived: Walker Arrival Time: 10:30 Accompanied By: wife Transfer Assistance: None Patient Identification Verified: Yes Secondary Verification Process Completed: Yes Electronic Signature(s) Signed: 11/28/2017 4:05:05 PM By: Secundino Ginger Entered By: Secundino Ginger on 11/28/2017 10:31:17 [name] ([id number]) -------------------------------------------------------------------------------- Clinic Level of Care Assessment Details Patient Name: [name] Date of Service: 11/28/2017 10:30 AM Medical Record Number: [id number] Patient Account Number: [id number] Date of Birth/Sex: 15-Jan-1935 (83 y.o. M) Treating RN: Cornell Barman Primary Care Deepa Barthel: Pernell Dupre Other Clinician: Referring Necola Bluestein: Harvest Dark Treating Hadasah Brugger/Extender: Tito Dine in Treatment: 0 Clinic Level of Care Assessment Items TOOL 2 Quantity Score []  - Use when only an EandM is performed on the INITIAL visit 0 ASSESSMENTS - Nursing Assessment / Reassessment X - General Physical Exam (combine w/ comprehensive assessment (listed just below) when 1 20 performed on new pt. evals) X- 1 25 Comprehensive Assessment (HX, ROS, Risk Assessments, Wounds Hx, etc.) ASSESSMENTS - Wound and Skin Assessment / Reassessment []  - Simple Wound Assessment / Reassessment - one wound 0 X- 2 5 Complex Wound Assessment / Reassessment - multiple wounds []  - 0 Dermatologic / Skin Assessment (not related to wound area) ASSESSMENTS - Ostomy and/or Continence Assessment and Care []  - Incontinence Assessment and Management 0 []  - 0 Ostomy Care Assessment and Management (repouching, etc.) PROCESS - Coordination of Care X - Simple Patient / Family Education for ongoing care 1 15 []  - 0 Complex (extensive) Patient / Family Education for ongoing care X- 1 10 Staff obtains Programmer, systems, Records, Test Results / Process Orders []  - 0 Staff telephones HHA, Nursing Homes / Clarify orders / etc []  - 0 Routine Transfer to another Facility (non-emergent condition) []  - 0 Routine Hospital Admission (non-emergent condition) X- 1 15 New Admissions / Biomedical engineer / Ordering NPWT, Apligraf, etc. []  - 0 Emergency Hospital Admission (emergent condition) []  - 0 Simple Discharge Coordination []  - 0 Complex (extensive) Discharge Coordination PROCESS - Special Needs []  - Pediatric / Minor Patient Management 0 []  - 0 Isolation Patient Management MARTISE, WADDELL. ([id number]) []  - 0 Hearing / Language / Visual special needs []  - 0 Assessment of Community assistance (transportation, D/C planning, etc.) []  - 0 Additional assistance / Altered mentation []  - 0 Support Surface(s) Assessment (bed, cushion, seat,  etc.) INTERVENTIONS - Wound Cleansing / Measurement X - Wound Imaging (photographs - any number of wounds) 1 5 []  - 0 Wound Tracing (instead of photographs) []  - 0 Simple Wound Measurement - one wound X- 2 5 Complex Wound Measurement - multiple wounds []  - 0 Simple Wound Cleansing - one wound X- 2 5 Complex Wound  Cleansing - multiple wounds INTERVENTIONS - Wound Dressings X - Small Wound Dressing one or multiple wounds 1 10 X- 1 15 Medium Wound Dressing one or multiple wounds []  - 0 Large Wound Dressing one or multiple wounds []  - 0 Application of Medications - injection INTERVENTIONS - Miscellaneous []  - External ear exam 0 []  - 0 Specimen Collection (cultures, biopsies, blood, body fluids, etc.) []  - 0 Specimen(s) / Culture(s) sent or taken to Lab for analysis []  - 0 Patient Transfer (multiple staff / Harrel Lemon Lift / Similar devices) []  - 0 Simple Staple / Suture removal (25 or less) []  - 0 Complex Staple / Suture removal (26 or more) []  - 0 Hypo / Hyperglycemic Management (close monitor of Blood Glucose) X- 1 15 Ankle / Brachial Index (ABI) - do not check if billed separately Has the patient been seen at the hospital within the last three years: Yes Total Score: 160 Level Of Care: New/Established - Level 5 Electronic Signature(s) Signed: 11/28/2017 4:45:51 PM By: Gretta Cool, BSN, RN, CWS, Kim RN, BSN Entered By: Gretta Cool, BSN, RN, CWS, Kim on 11/28/2017 12:54:20 JEMUEL, LAURSEN ([id number]) -------------------------------------------------------------------------------- Encounter Discharge Information Details Patient Name: [name] Date of Service: 11/28/2017 10:30 AM Medical Record Number: [id number] Patient Account Number: [id number] Date of Birth/Sex: September 27, 1934 (83 y.o. M) Treating RN: Cornell Barman Primary Care Brysten Reister: Pernell Dupre Other Clinician: Referring Darlina Mccaughey: Harvest Dark Treating Mohini Heathcock/Extender: Tito Dine in Treatment:  0 Encounter Discharge Information Items Discharge Condition: Stable Ambulatory Status: Ambulatory Discharge Destination: Home Health Telephoned: No Orders Sent: Yes Transportation: Private Auto Accompanied By: wife Schedule Follow-up Appointment: Yes Clinical Summary of Care: Electronic Signature(s) Signed: 11/28/2017 12:51:20 PM By: Gretta Cool, BSN, RN, CWS, Kim RN, BSN Entered By: Gretta Cool, BSN, RN, CWS, Kim on 11/28/2017 12:51:20 [name] ([id number]) -------------------------------------------------------------------------------- Lower Extremity Assessment Details Patient Name: [name] Date of Service: 11/28/2017 10:30 AM Medical Record Number: [id number] Patient Account Number: [id number] Date of Birth/Sex: Oct 15, 1934 (83 y.o. M) Treating RN: Secundino Ginger Primary Care Theresa Wedel: Pernell Dupre Other Clinician: Referring Majesty Oehlert: Harvest Dark Treating Earnesteen Birnie/Extender: Tito Dine in Treatment: 0 Edema Assessment Assessed: [Left: No] [Right: No] Edema: [Left: No] [Right: No] Calf Left: Right: Point of Measurement: 32 cm From Medial Instep 30 cm 30 cm Ankle Left: Right: Point of Measurement: 10 cm From Medial Instep 19.5 cm 19 cm Vascular Assessment Claudication: Claudication Assessment [Left:None] [Right:None] Pulses: Dorsalis Pedis Palpable: [Left:Yes] [Right:Yes] Posterior Tibial Extremity colors, hair growth, and conditions: Extremity Color: [Left:Normal] [Right:Normal] Temperature of Extremity: [Left:Warm] [Right:Warm] Capillary Refill: [Left:< 3 seconds] [Right:< 3 seconds] Blood Pressure: Brachial: [Right:160] Dorsalis Pedis: 170 [Left:Dorsalis Pedis: 150] Ankle: Posterior Tibial: 180 [Left:Posterior Tibial: 180 1.13] [Right:1.13] Toe Nail Assessment Left: Right: Thick: Yes Yes Discolored: Yes Yes Deformed: Yes Yes Improper Length and Hygiene: No No Electronic Signature(s) Signed: 11/28/2017 4:05:05 PM By: Secundino Ginger Entered By: Secundino Ginger on 11/28/2017 11:27:02 [name] ([id number]) -------------------------------------------------------------------------------- Multi Wound Chart Details Patient Name: [name] Date of Service: 11/28/2017 10:30 AM Medical Record Number: [id number] Patient Account Number: [id number] Date of Birth/Sex: 04/24/1934 (83 y.o. M) Treating RN: Cornell Barman Primary Care Roselyne Stalnaker: Pernell Dupre Other Clinician: Referring Akiva Brassfield: Harvest Dark Treating Keyarah Mcroy/Extender: Tito Dine in Treatment: 0 Vital Signs Height(in): 72 Pulse(bpm): 38 Weight(lbs): 192.7 Blood Pressure(mmHg): 169/63 Body Mass Index(BMI): 26 Temperature(F): 98.2 Respiratory Rate 16 (breaths/min): Photos: [N/A:N/A] Wound Location: Left Toe Second - Right Hand - Palm - Dorsal N/A Circumfernential Wounding Event:  Trauma Trauma N/A Primary Etiology: Trauma, Other Trauma, Other N/A Comorbid History: N/A Chronic Obstructive N/A Pulmonary Disease (COPD), Type II Diabetes, Neuropathy Date Acquired: 11/08/2017 11/14/2017 N/A Weeks of Treatment: 0 0 N/A Wound Status: Open Open N/A Measurements L x W x D 0.5x2.5x0.1 2x0.8x0.6 N/A (cm) Area (cm) : 0.982 1.257 N/A Volume (cm) : 0.098 0.754 N/A % Reduction in Area: 0.00% 0.00% N/A % Reduction in Volume: 0.00% 0.00% N/A Classification: Full Thickness Without Full Thickness Without N/A Exposed Support Structures Exposed Support Structures Exudate Amount: Small Medium N/A Exudate Type: Serosanguineous Serosanguineous N/A Exudate Color: red, brown red, brown N/A Granulation Amount: Small (1-33%) Small (1-33%) N/A Necrotic Amount: Medium (34-66%) Medium (34-66%) N/A Exposed Structures: Fascia: No Fascia: No N/A Fat Layer (Subcutaneous Fat Layer (Subcutaneous Tissue) Exposed: No Tissue) Exposed: No Tendon: No Tendon: No Muscle: No Muscle: No SHAY, BARTOLI ([id number]) Joint: No Joint: No Bone:  No Bone: No Epithelialization: Small (1-33%) N/A N/A Periwound Skin Texture: Excoriation: No Excoriation: No N/A Induration: No Induration: No Callus: No Callus: No Crepitus: No Crepitus: No Rash: No Rash: No Scarring: No Scarring: No Periwound Skin Moisture: Maceration: Yes Maceration: Yes N/A Dry/Scaly: No Dry/Scaly: No Periwound Skin Color: Atrophie Blanche: No Atrophie Blanche: No N/A Cyanosis: No Cyanosis: No Ecchymosis: No Ecchymosis: No Erythema: No Erythema: No Hemosiderin Staining: No Hemosiderin Staining: No Mottled: No Mottled: No Pallor: No Pallor: No Rubor: No Rubor: No Tenderness on Palpation: No No N/A Wound Preparation: Ulcer Cleansing: Ulcer Cleansing: N/A Rinsed/Irrigated with Saline Rinsed/Irrigated with Saline Topical Anesthetic Applied: Topical Anesthetic Applied: Other: lidocaine 4% Other: lidocaine 4% Treatment Notes Electronic Signature(s) Signed: 11/28/2017 4:09:45 PM By: Linton Ham MD Entered By: Linton Ham on 11/28/2017 12:25:23 EQUAN, COGBILL ([id number]) -------------------------------------------------------------------------------- [place] Details Patient Name: [name] Date of Service: 11/28/2017 10:30 AM Medical Record Number: [id number] Patient Account Number: [id number] Date of Birth/Sex: 10/30/1934 (83 y.o. M) Treating RN: Cornell Barman Primary Care Karl Knarr: Pernell Dupre Other Clinician: Referring Jamekia Gannett: Harvest Dark Treating Alleyah Twombly/Extender: Tito Dine in Treatment: 0 Active Inactive ` Abuse / Safety / Falls / Self Care Management Nursing Diagnoses: History of Falls Impaired physical mobility Potential for falls Goals: Patient will remain injury free related to falls Date Initiated: 11/28/2017 Target Resolution Date: 12/12/2017 Goal Status: Active Patient/caregiver will verbalize understanding of skin care regimen Date Initiated:  11/28/2017 Target Resolution Date: 12/12/2017 Goal Status: Active Interventions: Assess fall risk on admission and as needed Notes: ` Medication Nursing Diagnoses: Knowledge deficit related to medication safety: actual or potential Goals: Patient/caregiver will demonstrate understanding of all current medications Date Initiated: 11/28/2017 Target Resolution Date: 12/12/2017 Goal Status: Active Patient/caregiver will demonstrate understanding of new oral/IV medications prescribed at the [facility] (topical prescriptions are covered under the skin breakdown problem) Date Initiated: 11/28/2017 Target Resolution Date: 12/12/2017 Goal Status: Active Interventions: Assess for medication contraindications each visit where new medications are prescribed Notes: ` Orientation to the [facility] LARUE, LIGHTNER [place]. ([id number]) Nursing Diagnoses: Knowledge deficit related to the wound healing center program Goals: Patient/caregiver will verbalize understanding of the [place] Program Date Initiated: 11/28/2017 Target Resolution Date: 12/12/2017 Goal Status: Active Interventions: Provide education on orientation to the wound center Notes: ` Wound/Skin Impairment Nursing Diagnoses: Impaired tissue integrity Goals: Ulcer/skin breakdown will have a volume reduction of 30% by week 4 Date Initiated: 11/28/2017 Target Resolution Date: 12/12/2017 Goal Status: Active Interventions: Assess ulceration(s) every visit Treatment Activities: Referred to DME Sausha Raymond for dressing supplies :  11/28/2017 Skin care regimen initiated : 11/28/2017 Notes: Electronic Signature(s) Signed: 11/28/2017 4:45:51 PM By: Gretta Cool, BSN, RN, CWS, Kim RN, BSN Entered By: Gretta Cool, BSN, RN, CWS, Kim on 11/28/2017 11:43:13 MARIS, BENA ([id number]) -------------------------------------------------------------------------------- Pain Assessment Details Patient Name: [name] Date of Service:  11/28/2017 10:30 AM Medical Record Number: [id number] Patient Account Number: [id number] Date of Birth/Sex: Mar 13, 1934 (83 y.o. M) Treating RN: Secundino Ginger Primary Care Prescott Truex: Pernell Dupre Other Clinician: Referring Nicolena Schurman: Harvest Dark Treating Loma [place]/Extender: Tito Dine in Treatment: 0 Active Problems Location of Pain Severity and Description of Pain Patient Has Paino Yes Site Locations Rate the pain. Current Pain Level: 5 Pain Management and Medication Current Pain Management: Notes Patient reports pain in his toe. Electronic Signature(s) Signed: 11/28/2017 4:05:05 PM By: Secundino Ginger Entered By: Secundino Ginger on 11/28/2017 10:32:00 [name] ([id number]) -------------------------------------------------------------------------------- Patient/Caregiver Education Details Patient Name: [name] Date of Service: 11/28/2017 10:30 AM Medical Record Number: [id number] Patient Account Number: [id number] Date of Birth/Gender: 10/22/1934 (83 y.o. M) Treating RN: Cornell Barman Primary Care Physician: Pernell Dupre Other Clinician: Referring Physician: Harvest Dark Treating Physician/Extender: Tito Dine in Treatment: 0 Education Assessment Education Provided To: Patient Education Topics Provided Infection: Handouts: Infection Prevention and Management Methods: Explain/Verbal Responses: State content correctly Safety: Handouts: Personal Safety, Other: wear shoes at all times when walking Methods: Demonstration Responses: State content correctly Electronic Signature(s) Signed: 11/28/2017 4:45:51 PM By: Gretta Cool, BSN, RN, CWS, Kim RN, BSN Entered By: Gretta Cool, BSN, RN, CWS, Kim on 11/28/2017 12:52:57 JAVONN, GAUGER ([id number]) -------------------------------------------------------------------------------- Wound Assessment Details Patient Name: [name] Date of Service: 11/28/2017 10:30 AM Medical Record Number:  [id number] Patient Account Number: [id number] Date of Birth/Sex: January 12, 1935 (83 y.o. M) Treating RN: Secundino Ginger Primary Care Jonah Nestle: Pernell Dupre Other Clinician: Referring Suheily Birks: Harvest Dark Treating Carmelite Violet/Extender: Tito Dine in Treatment: 0 Wound Status Wound Number: 1 Primary Etiology: Trauma, Other Wound Location: Left Toe Second - Circumfernential Wound Status: Open Wounding Event: Trauma Date Acquired: 11/08/2017 Weeks Of Treatment: 0 Clustered Wound: No Photos Photo Uploaded By: Secundino Ginger on 11/28/2017 11:32:00 Wound Measurements Length: (cm) 0.5 Width: (cm) 2.5 Depth: (cm) 0.1 Area: (cm) 0.982 Volume: (cm) 0.098 % Reduction in Area: 0% % Reduction in Volume: 0% Epithelialization: Small (1-33%) Tunneling: No Undermining: No Wound Description Full Thickness Without Exposed Support Foul Classification: Structures Slou Exudate Small Amount: Exudate Type: Serosanguineous Exudate Color: red, brown Odor After Cleansing: No gh/Fibrino Yes Wound Bed Granulation Amount: Small (1-33%) Exposed Structure Necrotic Amount: Medium (34-66%) Fascia Exposed: No Necrotic Quality: Adherent Slough Fat Layer (Subcutaneous Tissue) Exposed: No Tendon Exposed: No Muscle Exposed: No Joint Exposed: No Bone Exposed: No Periwound Skin Texture JOHNTAE, BROXTERMAN. ([id number]) Texture Color No Abnormalities Noted: No No Abnormalities Noted: No Callus: No Atrophie Blanche: No Crepitus: No Cyanosis: No Excoriation: No Ecchymosis: No Induration: No Erythema: No Rash: No Hemosiderin Staining: No Scarring: No Mottled: No Pallor: No Moisture Rubor: No No Abnormalities Noted: No Dry / Scaly: No Maceration: Yes Wound Preparation Ulcer Cleansing: Rinsed/Irrigated with Saline Topical Anesthetic Applied: Other: lidocaine 4%, Treatment Notes Wound #1 (Left, Circumferential Toe Second) 1. Cleansed with: Clean wound with Normal Saline 2.  Anesthetic Topical Lidocaine 4% cream to wound bed prior to debridement 4. Dressing Applied: Iodoflex Notes Hand: Bolster gauze, coban to snuggly secure; Toe: gauze lightly. Electronic Signature(s) Signed: 11/28/2017 4:05:05 PM By: Secundino Ginger Entered By: Secundino Ginger on 11/28/2017 10:50:06 [name] ([id number]) -------------------------------------------------------------------------------- Wound  Assessment Details Patient Name: JAISON, PETRAGLIA Date of Service: 11/28/2017 10:30 AM Medical Record Number: [id number] Patient Account Number: [id number] Date of Birth/Sex: 1934/05/22 (83 y.o. M) Treating RN: Secundino Ginger Primary Care Desiree Fleming: Pernell Dupre Other Clinician: Referring Caleyah Jr: Harvest Dark Treating Rhayne Chatwin/Extender: Tito Dine in Treatment: 0 Wound Status Wound Number: 2 Primary Trauma, Other Etiology: Wound Location: Right Hand - Palm - Dorsal Wound Open Wounding Event: Trauma Status: Date Acquired: 11/14/2017 Comorbid Chronic Obstructive Pulmonary Disease Weeks Of Treatment: 0 History: (COPD), Type II Diabetes, Neuropathy Clustered Wound: No Photos Photo Uploaded By: Secundino Ginger on 11/28/2017 11:32:01 Wound Measurements Length: (cm) 2 Width: (cm) 0.8 Depth: (cm) 0.6 Area: (cm) 1.257 Volume: (cm) 0.754 % Reduction in Area: 0% % Reduction in Volume: 0% Tunneling: No Undermining: No Wound Description Full Thickness Without Exposed Support Foul Classification: Structures Slou Exudate Medium Amount: Exudate Type: Serosanguineous Exudate Color: red, brown Odor After Cleansing: No gh/Fibrino No Wound Bed Granulation Amount: Small (1-33%) Exposed Structure Necrotic Amount: Medium (34-66%) Fascia Exposed: No Fat Layer (Subcutaneous Tissue) Exposed: No Tendon Exposed: No Muscle Exposed: No Joint Exposed: No Bone Exposed: No Periwound Skin Texture AJIT, ERRICO. ([id number]) Texture Color No Abnormalities Noted: No No  Abnormalities Noted: No Callus: No Atrophie Blanche: No Crepitus: No Cyanosis: No Excoriation: No Ecchymosis: No Induration: No Erythema: No Rash: No Hemosiderin Staining: No Scarring: No Mottled: No Pallor: No Moisture Rubor: No No Abnormalities Noted: No Dry / Scaly: No Maceration: Yes Wound Preparation Ulcer Cleansing: Rinsed/Irrigated with Saline Topical Anesthetic Applied: Other: lidocaine 4%, Treatment Notes Wound #2 (Right, Dorsal Hand - Palm) 1. Cleansed with: Clean wound with Normal Saline 2. Anesthetic Topical Lidocaine 4% cream to wound bed prior to debridement 4. Dressing Applied: Iodoflex Notes Hand: Bolster gauze, coban to snuggly secure; Toe: gauze lightly. Electronic Signature(s) Signed: 11/28/2017 4:05:05 PM By: Secundino Ginger Entered By: Secundino Ginger on 11/28/2017 11:23:03 [name] ([id number]) -------------------------------------------------------------------------------- [place] Details Patient Name: [name] Date of Service: 11/28/2017 10:30 AM Medical Record Number: [id number] Patient Account Number: [id number] Date of Birth/Sex: 1934/09/18 (83 y.o. M) Treating RN: Secundino Ginger Primary Care Kerra Guilfoil: Pernell Dupre Other Clinician: Referring Neesa Knapik: Harvest Dark Treating Sivan Cuello/Extender: Tito Dine in Treatment: 0 Vital Signs Time Taken: 10:30 Temperature (F): 98.2 Height (in): 72 Pulse (bpm): 69 Source: Stated Respiratory Rate (breaths/min): 16 Weight (lbs): 192.7 Blood Pressure (mmHg): 169/63 Source: Measured Reference Range: 80 - 120 mg / dl Body Mass Index (BMI): 26.1 Electronic Signature(s) Signed: 11/28/2017 4:05:05 PM By: Secundino Ginger Entered BySecundino Ginger on 11/28/2017 10:34:26

## 2017-11-30 NOTE — Progress Notes (Signed)
CEDAR, DITULLIO ([id number]) Visit Report for 11/28/2017 Chief Complaint Document Details Patient Name: Chase Mendez, Chase Mendez Date of Service: 11/28/2017 10:30 AM Medical Record Number: [id number] Patient Account Number: [id number] Date of Birth/Sex: 1934-09-03 (83 y.o. M) Treating RN: Cornell Barman Primary Care Provider: Pernell Dupre Other Clinician: Referring Provider: Harvest Dark Treating Provider/Extender: Tito Dine in Treatment: 0 Information Obtained from: Patient Chief Complaint 11/28/17; patient is here for 2 wounds both of which initially started from falls and lacerations one on the hypothenar eminence of the right hand and one on the left second toe Electronic Signature(s) Signed: 11/28/2017 4:09:45 PM By: Linton Ham MD Entered By: Linton Ham on 11/28/2017 12:27:22 Chase Mendez ([id number]) -------------------------------------------------------------------------------- HPI Details Patient Name: Chase Mendez Date of Service: 11/28/2017 10:30 AM Medical Record Number: [id number] Patient Account Number: [id number] Date of Birth/Sex: Oct 02, 1934 (83 y.o. M) Treating RN: Cornell Barman Primary Care Provider: Pernell Dupre Other Clinician: Referring Provider: Harvest Dark Treating Provider/Extender: Tito Dine in Treatment: 0 History of Present Illness HPI Description: ADMISSION 11/28/17 This is an 83 year old man who had an initial fall on October 3. Apparently he suffered a laceration of the left second toe that required 5 sutures. The sutures were removed in mid October. The patient traumatized the toe again 4 days ago while walking on a carpet. He apparently has left foot drop and he catches the forefoot when he walks. This reopened the toe. He was seen in the ER yesterday. An x-ray done of the toes suggested a fracture at the lateral base of the distal phalanx that was apparently well approximated. He was also noted to  have an infection in the toe/cellulitis and was started on clindamycin. With regards to his right hand he had another fall on October 9. He suffered a laceration with a flap dehiscence over the Hypo-thenar eminence just distal to the wrist. He did not get this sutured however sometime after this he saw his primary care physician who tried to use Dermabond on this but it did not adhere. He is a diabetic but is on diet alone. His metformin was stopped when his blood sugars were in the normal range and his hemoglobin A1c was in the mid fives. He has foot drop secondary to apparently back issues. He has had a prior history of L5 radiculopathy. Beside this he has chronic gastritis hypothyroidism gastroesophageal reflux disease ABIs in our clinic were 1.13 bilaterally Electronic Signature(s) Signed: 11/28/2017 4:09:45 PM By: Linton Ham MD Entered By: Linton Ham on 11/28/2017 12:31:47 FREDERIC, TONES ([id number]) -------------------------------------------------------------------------------- Physical Exam Details Patient Name: Chase Mendez Date of Service: 11/28/2017 10:30 AM Medical Record Number: [id number] Patient Account Number: [id number] Date of Birth/Sex: 1934-03-01 (83 y.o. M) Treating RN: Cornell Barman Primary Care Provider: Pernell Dupre Other Clinician: Referring Provider: Harvest Dark Treating Provider/Extender: Tito Dine in Treatment: 0 Constitutional Patient is hypertensive.. Pulse regular and within target range for patient.[name] Respirations regular, non-labored and within target range.. Temperature is normal and within the target range for the patient.[name] appears in no distress. Eyes Conjunctivae clear. No discharge. Respiratory Respiratory effort is easy and symmetric bilaterally. Rate is normal at rest and on room air.. Cardiovascular Heart rhythm and rate regular, without murmur or gallop.. Femoral arteries without bruits and pulses strong..  Pedal pulses palpable and strong bilaterally.. Musculoskeletal The condition of the left second toe is concerning. There is swelling over the PIP with tenderness. Erythema extends over the entire toe into the dorsal  base of the toe. Marked tenderness.. Integumentary (Hair, Skin) No primary skin issue is seen. Neurological He has a left foot drop. Psychiatric No evidence of depression, anxiety, or agitation. Calm, cooperative, and communicative. Appropriate interactions and affect.. Notes Wound exam #1 right hypo-thenar eminence. This as a flap of skin subcutaneous tissue that is still viable coming from the superior surface. The underlying tissue also looks viable. However there is absolutely no adherence of the 2 surfaces. No evidence of infection #2 left second toe is swollen and erythematous. The open area is over the DIP and this probes to bone but not into the DIP joint itself. He has swelling of the PIP with erythema going from all the way from the DIP down to the base of the second toe. There is tenderness here. Electronic Signature(s) Signed: 11/28/2017 4:09:45 PM By: Linton Ham MD Entered By: Linton Ham on 11/28/2017 12:36:15 Chase Mendez ([id number]) -------------------------------------------------------------------------------- Physician Orders Details Patient Name: Chase Mendez Date of Service: 11/28/2017 10:30 AM Medical Record Number: [id number] Patient Account Number: [id number] Date of Birth/Sex: Jun 27, 1934 (83 y.o. M) Treating RN: Cornell Barman Primary Care Provider: Pernell Dupre Other Clinician: Referring Provider: Harvest Dark Treating Provider/Extender: Tito Dine in Treatment: 0 Verbal / Phone Orders: No Diagnosis Coding Wound Cleansing Wound #1 Left,Circumferential Toe Second o Clean wound with Normal Saline. Wound #2 Right,Dorsal Hand - Palm o Clean wound with Normal Saline. Anesthetic (add to Medication  List) Wound #1 Left,Circumferential Toe Second o Topical Lidocaine 4% cream applied to wound bed prior to debridement (In Clinic Only). Wound #2 Right,Dorsal Hand - Palm o Topical Lidocaine 4% cream applied to wound bed prior to debridement (In Clinic Only). Primary Wound Dressing Wound #1 Left,Circumferential Toe Second o Iodoflex Wound #2 Right,Dorsal Hand - Palm o Iodoflex Secondary Dressing Wound #1 Left,Circumferential Toe Second o Conform/Kerlix - Lightly on toe. Wound #2 Right,Dorsal Hand - Palm o Other - gauze, conform and coban compression dressing on hand. Make sure it is snug. Dressing Change Frequency Wound #1 Left,Circumferential Toe Second o Change Dressing Monday, Wednesday, Friday Wound #2 Right,Dorsal Hand - Palm o Change Dressing Monday, Wednesday, Friday Follow-up Appointments Wound #1 Left,Circumferential Toe Second o Return Appointment in 1 week. Wound #2 Right,Dorsal Hand - Palm o Return Appointment in 1 week. Additional Orders / Instructions LARREN, COPES. ([id number]) Wound #1 Left,Circumferential Toe Second o Other: - Watch redness on toe. If it spreads, go straight to the Emergency Department for assessment. Home Health Wound #1 Left,Circumferential [place] for [place] Nurse may visit PRN to address patientos wound care needs. o FACE TO FACE ENCOUNTER: MEDICARE and MEDICAID PATIENTS: I certify that this patient is under my care and that I had a face-to-face encounter that meets the physician face-to-face encounter requirements with this patient on this date. The encounter with the patient was in whole or in part for the following MEDICAL CONDITION: (primary reason for [place]) MEDICAL NECESSITY: I certify, that based on my findings, NURSING services are a medically necessary home health service. HOME BOUND STATUS: I certify that my clinical findings support that this  patient is homebound (i.e., Due to illness or injury, pt requires aid of supportive devices such as crutches, cane, wheelchairs, walkers, the use of special transportation or the assistance of another person to leave their place of residence. There is a normal inability to leave the home and doing so requires considerable and taxing effort. Other absences  are for medical reasons / religious services and are infrequent or of short duration when for other reasons). o If current dressing causes regression in wound condition, may D/C ordered dressing product/s and apply Normal Saline Moist Dressing daily until next [place] / Other MD appointment. [place] of regression in wound condition at [phone]. o Please direct any NON-WOUND related issues/requests for orders to patient's Primary Care Physician Wound #2 [place] for [place] Nurse may visit PRN to address patientos wound care needs. o FACE TO FACE ENCOUNTER: MEDICARE and MEDICAID PATIENTS: I certify that this patient is under my care and that I had a face-to-face encounter that meets the physician face-to-face encounter requirements with this patient on this date. The encounter with the patient was in whole or in part for the following MEDICAL CONDITION: (primary reason for [place]) MEDICAL NECESSITY: I certify, that based on my findings, NURSING services are a medically necessary home health service. HOME BOUND STATUS: I certify that my clinical findings support that this patient is homebound (i.e., Due to illness or injury, pt requires aid of supportive devices such as crutches, cane, wheelchairs, walkers, the use of special transportation or the assistance of another person to leave their place of residence. There is a normal inability to leave the home and doing so requires considerable and taxing effort. Other absences are for  medical reasons / religious services and are infrequent or of short duration when for other reasons). o If current dressing causes regression in wound condition, may D/C ordered dressing product/s and apply Normal Saline Moist Dressing daily until next [place] / Other MD appointment. [place] of regression in wound condition at [phone]. o Please direct any NON-WOUND related issues/requests for orders to patient's Primary Care Physician Medications-please add to medication list. Wound #1 Left,Circumferential Toe Second o P.O. Antibiotics - Continue Antibiotics Wound #2 Right,Dorsal Hand - Palm o P.O. Antibiotics - Continue Antibiotics Electronic Signature(s) Signed: 11/28/2017 4:09:45 PM By: Linton Ham MD Signed: 11/28/2017 4:45:51 PM By: Gretta Cool, BSN, RN, CWS, Kim RN, BSN Entered By: Gretta Cool, BSN, RN, CWS, Kim on 11/28/2017 12:49:16 CAMRIN, GEARHEART ([id number]) -------------------------------------------------------------------------------- Problem List Details Patient Name: Chase Mendez Date of Service: 11/28/2017 10:30 AM Medical Record Number: [id number] Patient Account Number: [id number] Date of Birth/Sex: 1934-10-01 (83 y.o. 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 M) Treating RN: Cornell Barman Primary Care Provider: Pernell Dupre Other Clinician: Referring Provider: Harvest Dark Treating Provider/Extender: Tito Dine in Treatment: 0 Subjective Chief Complaint Information obtained from Patient 11/28/17; patient is here for 2 wounds both of which initially started from falls and lacerations  one on the hypothenar eminence of the right hand and one on the left second toe History of Present Illness (HPI) ADMISSION 11/28/17 This is an 83 year old man who had an initial fall on October 3. Apparently he suffered a laceration of the left second toe that required 5 sutures. The sutures were removed in mid October. The patient traumatized the toe again 4 days ago while walking on a carpet. He apparently has left foot drop and he catches the forefoot when he walks. This reopened the toe. He was seen in the ER yesterday. An x-ray done of the toes suggested a fracture at the lateral base of the distal phalanx that was apparently well approximated. He was also noted to have an infection in the toe/cellulitis and was started on clindamycin. With regards to his right hand he had another fall on October 9. He suffered a laceration with a flap dehiscence over the Hypo-thenar eminence just distal to the wrist. He did not get this sutured however sometime after this he saw his primary care physician who tried to use Dermabond on this but it did not adhere. He is a diabetic but is on diet alone. His metformin was stopped when his blood sugars were in the normal range and his hemoglobin A1c was in the mid  fives. He has foot drop secondary to apparently back issues. He has had a prior history of L5 radiculopathy. Beside this he has chronic gastritis hypothyroidism gastroesophageal reflux disease ABIs in our clinic were 1.13 bilaterally Wound History Patient presents with 2 open wounds that have been present for approximately 2 weeks. Patient has been treating wounds in the following manner: soap and H2O. The wounds have been healed in the past but have re-opened. Laboratory tests have not been performed in the last month. Patient reportedly has not tested positive for an antibiotic resistant organism. Patient reportedly has not tested positive for osteomyelitis. Patient reportedly has not had testing performed to evaluate circulation in the legs. Patient History Information obtained from Patient, Caregiver. Allergies Sulfa (Sulfonamide Antibiotics), ibuprofen Family History Cancer - Siblings, Diabetes - Father, Heart Disease - Mother,Father,Siblings, No family history of Hereditary Spherocytosis, Hypertension, Stroke, Thyroid Problems, Tuberculosis. Social History Light tobacco smoker, Alcohol Use - Never, Drug Use - No History, Caffeine Use - Daily. NGAI, PARCELL ([id number]) Medical History Ear/Nose/Mouth/Throat Denies history of Chronic sinus problems/congestion, Middle ear problems Hematologic/Lymphatic Denies history of Anemia, Hemophilia, Human Immunodeficiency Virus, Lymphedema, Sickle Cell Disease Respiratory Patient has history of Chronic Obstructive Pulmonary Disease (COPD) - 8 months Denies history of Aspiration, Asthma, Pneumothorax, Sleep Apnea, Tuberculosis Cardiovascular Denies history of Angina, Arrhythmia, Congestive Heart Failure, Coronary Artery Disease, Deep Vein Thrombosis, Hypertension, Hypotension, Myocardial Infarction, Peripheral Arterial Disease, Peripheral Venous Disease, Phlebitis, Vasculitis Gastrointestinal Denies history of Cirrhosis , Colitis, Crohn s,  Hepatitis A, Hepatitis B, Hepatitis C Endocrine Patient has history of Type II Diabetes - 7 years ago Denies history of Type I Diabetes Genitourinary Denies history of End Stage Renal Disease Immunological Denies history of Lupus Erythematosus, Raynaud s, Scleroderma Integumentary (Skin) Denies history of History of Burn, History of pressure wounds Musculoskeletal Denies history of Gout, Rheumatoid Arthritis, Osteoarthritis, Osteomyelitis Neurologic Patient has history of Neuropathy - LEB Denies history of Dementia, Quadriplegia, Paraplegia, Seizure Disorder Oncologic Denies history of Received Chemotherapy, Received Radiation Patient is treated with Controlled Diet. Medical And Surgical History Notes Ear/Nose/Mouth/Throat c/o ear wax Review of Systems (ROS) Constitutional Symptoms (General Health) Denies complaints or symptoms of Fatigue, Fever, Chills, Marked Weight  Change. Eyes Complains or has symptoms of Glasses / Contacts - glasses. Denies complaints or symptoms of Dry Eyes, Vision Changes. Ear/Nose/Mouth/Throat Denies complaints or symptoms of Difficult clearing ears, Sinusitis. Hematologic/Lymphatic Denies complaints or symptoms of Bleeding / Clotting Disorders, Human Immunodeficiency Virus. Respiratory Denies complaints or symptoms of Chronic or frequent coughs, Shortness of Breath. Cardiovascular The patient has no complaints or symptoms. Gastrointestinal Complains or has symptoms of Nausea - emptying test tomorrow. Denies complaints or symptoms of Frequent diarrhea, Vomiting. Endocrine Denies complaints or symptoms of Hepatitis, Thyroid disease, Polydypsia (Excessive Thirst). Genitourinary Denies complaints or symptoms of Kidney failure/ Dialysis, Incontinence/dribbling. [place] ([id number]) Denies complaints or symptoms of Hives, Itching. Integumentary (Skin) Complains or has symptoms of Wounds, Bleeding or bruising tendency,  Breakdown. Denies complaints or symptoms of Swelling. Musculoskeletal Complains or has symptoms of Muscle Weakness - lt leg due to back injury in 95'. Denies complaints or symptoms of Muscle Pain. Neurologic Complains or has symptoms of Numbness/parasthesias - left leg. Denies complaints or symptoms of Focal/Weakness. Objective Constitutional Patient is hypertensive.. Pulse regular and within target range for patient.[name] Respirations regular, non-labored and within target range.. Temperature is normal and within the target range for the patient.[name] appears in no distress. Vitals Time Taken: 10:30 AM, Height: 72 in, Source: Stated, Weight: 192.7 lbs, Source: Measured, BMI: 26.1, Temperature: 98.2 F, Pulse: 69 bpm, Respiratory Rate: 16 breaths/min, Blood Pressure: 169/63 mmHg. Eyes Conjunctivae clear. No discharge. Respiratory Respiratory effort is easy and symmetric bilaterally. Rate is normal at rest and on room air.. Cardiovascular Heart rhythm and rate regular, without murmur or gallop.. Femoral arteries without bruits and pulses strong.. Pedal pulses palpable and strong bilaterally.. Musculoskeletal The condition of the left second toe is concerning. There is swelling over the PIP with tenderness. Erythema extends over the entire toe into the dorsal base of the toe. Marked tenderness.. Neurological He has a left foot drop. Psychiatric No evidence of depression, anxiety, or agitation. Calm, cooperative, and communicative. Appropriate interactions and affect.. General Notes: Wound exam #1 right hypo-thenar eminence. This as a flap of skin subcutaneous tissue that is still viable coming from the superior surface. The underlying tissue also looks viable. However there is absolutely no adherence of the 2 surfaces. No evidence of infection #2 left second toe is swollen and erythematous. The open area is over the DIP and this probes to bone but not into the DIP joint itself. He has swelling  of the PIP with erythema going from all the way from the DIP down to the base of the second toe. There is tenderness here. Integumentary (Hair, Skin) No primary skin issue is seen. SENON, NIXON. ([id number]) Wound #1 status is Open. Original cause of wound was Trauma. The wound is located on the Left,Circumferential Toe Second. The wound measures 0.5cm length x 2.5cm width x 0.1cm depth; 0.982cm^2 area and 0.098cm^3 volume. There is no tunneling or undermining noted. There is a small amount of serosanguineous drainage noted. There is small (1-33%) granulation within the wound bed. There is a medium (34-66%) amount of necrotic tissue within the wound bed including Adherent Slough. The periwound skin appearance exhibited: Maceration. The periwound skin appearance did not exhibit: Callus, Crepitus, Excoriation, Induration, Rash, Scarring, Dry/Scaly, Atrophie [place], Cyanosis, Ecchymosis, Hemosiderin Staining, Mottled, Pallor, Rubor, Erythema. Wound #2 status is Open. Original cause of wound was Trauma. The wound is located on the Right,Dorsal Hand - Palm. The wound measures 2cm length x 0.8cm width x 0.6cm depth; 1.257cm^2 area  and 0.754cm^3 volume. There is no tunneling or undermining noted. There is a medium amount of serosanguineous drainage noted. There is small (1-33%) granulation within the wound bed. There is a medium (34-66%) amount of necrotic tissue within the wound bed. The periwound skin appearance exhibited: Maceration. The periwound skin appearance did not exhibit: Callus, Crepitus, Excoriation, Induration, Rash, Scarring, Dry/Scaly, Atrophie [place], Cyanosis, Ecchymosis, Hemosiderin Staining, Mottled, Pallor, Rubor, Erythema. Assessment Active Problems ICD-10 Unspecified open wound of right hand, subsequent encounter Type 2 diabetes mellitus with foot ulcer Non-pressure chronic ulcer of other part of left foot with necrosis of bone Cellulitis of left lower limb Plan Wound  Cleansing: Wound #1 Left,Circumferential Toe Second: Clean wound with Normal Saline. Wound #2 Right,Dorsal Hand - Palm: Clean wound with Normal Saline. Anesthetic (add to Medication List): Wound #1 Left,Circumferential Toe Second: Topical Lidocaine 4% cream applied to wound bed prior to debridement (In Clinic Only). Wound #2 Right,Dorsal Hand - Palm: Topical Lidocaine 4% cream applied to wound bed prior to debridement (In Clinic Only). Primary Wound Dressing: Wound #1 Left,Circumferential Toe Second: Iodoflex Wound #2 Right,Dorsal Hand - Palm: Iodoflex Secondary Dressing: Wound #1 Left,Circumferential Toe Second: Conform/Kerlix - Lightly on toe. Wound #2 Right,Dorsal Hand - Palm: Other - gauze, conform and coban compression dressing on hand Dressing Change Frequency: Wound #1 Left,Circumferential Toe Second: Change Dressing Monday, Wednesday, Friday WALLACE, GAPPA ([id number]) Wound #2 Right,Dorsal Hand - Palm: Change Dressing Monday, Wednesday, Friday Follow-up Appointments: Wound #1 Left,Circumferential Toe Second: Return Appointment in 1 week. Wound #2 Right,Dorsal Hand - Palm: Return Appointment in 1 week. Additional Orders / Instructions: Wound #1 Left,Circumferential Toe Second: Other: - Watch redness on toe. If it spreads, go straight to the Emergency Department for assessment. Wound #2 Right,Dorsal Hand - Palm: Other: - Watch redness on toe. If it spreads, go straight to the Emergency Department for assessment. Home Health: Wound #1 Left,Circumferential Toe Second: [place] for [facility] Nurse may visit PRN to address patient s wound care needs. FACE TO FACE ENCOUNTER: MEDICARE and MEDICAID PATIENTS: I certify that this patient is under my care and that I had a face-to-face encounter that meets the physician face-to-face encounter requirements with this patient on this date. The encounter with the patient was in whole or in part for the  following MEDICAL CONDITION: (primary reason for [place]) MEDICAL NECESSITY: I certify, that based on my findings, NURSING services are a medically necessary home health service. HOME BOUND STATUS: I certify that my clinical findings support that this patient is homebound (i.e., Due to illness or injury, pt requires aid of supportive devices such as crutches, cane, wheelchairs, walkers, the use of special transportation or the assistance of another person to leave their place of residence. There is a normal inability to leave the home and doing so requires considerable and taxing effort. Other absences are for medical reasons / religious services and are infrequent or of short duration when for other reasons). If current dressing causes regression in wound condition, may D/C ordered dressing product/s and apply Normal Saline Moist Dressing daily until next [place] / Other MD appointment. [place] of regression in wound condition at [phone]. Please direct any NON-WOUND related issues/requests for orders to patient's Primary Care Physician Wound #2 Right,Dorsal Hand - Palm: [place] for [place] Nurse may visit PRN to address patient s wound care needs. FACE TO FACE ENCOUNTER: MEDICARE and MEDICAID PATIENTS: I certify that  this patient is under my care and that I had a face-to-face encounter that meets the physician face-to-face encounter requirements with this patient on this date. The encounter with the patient was in whole or in part for the following MEDICAL CONDITION: (primary reason for [place] MEDICAL NECESSITY: I certify, that based on my findings, NURSING services are a medically necessary home health service. HOME BOUND STATUS: I certify that my clinical findings support that this patient is homebound (i.e., Due to illness or injury, pt requires aid of supportive devices such as crutches, cane,  wheelchairs, walkers, the use of special transportation or the assistance of another person to leave their place of residence. There is a normal inability to leave the home and doing so requires considerable and taxing effort. Other absences are for medical reasons / religious services and are infrequent or of short duration when for other reasons). If current dressing causes regression in wound condition, may D/C ordered dressing product/s and apply Normal Saline Moist Dressing daily until next [place] / Other MD appointment. [place] of regression in wound condition at [phone]. Please direct any NON-WOUND related issues/requests for orders to patient's Primary Care Physician Medications-please add to medication list.: Wound #1 Left,Circumferential Toe Second: P.O. Antibiotics - Continue Antibiotics Wound #2 Right,Dorsal Hand - Palm: P.O. Antibiotics - Continue Antibiotics #1 right hypo-thenar eminence laceration injury after failed Dermabond attempt. Miraculously now 2 weeks plus later the flap. Still viable with skin and subcutaneous tissue. We put I out of flex at the base of the wound to see if we can promote some adherence. We put this under felt and [place] and it will need to be snugged to see if we can get this appearance we are looking for. I found myself wondering about advanced treatment options to promote adherence as well. If the flap does not remain viable little need to be removed and there'll be a fairly deep wound to attempt to get closure here. TRENTEN, WATCHMAN. ([id number]) #2 left second toe with a wound that's has exposed bone. Also coexistent cellulitis. X-ray done in the emergency room yesterday showed a distal phalanx fracture but no other findings. We use Iodoflex to the wound here. #3 left second toe cellulitis I have marked the area in the proximal toe dorsally. If this area extends they will need to seek more urgent medical  attention. Clindamycin of course only covers gram positives and anaerobes #4 I think the left second toe is at some risk of requiring amputation. #5 the patient does not have a vascular issue based on bedside exam at our clinic ABIs Electronic Signature(s) Signed: 11/28/2017 4:09:45 PM By: Linton Ham MD Entered By: Linton Ham on 11/28/2017 12:47:19 ASHAAD, GAERTNER ([id number]) -------------------------------------------------------------------------------- ROS/PFSH Details Patient Name: Chase Mendez Date of Service: 11/28/2017 10:30 AM Medical Record Number: [id number] Patient Account Number: [id number] Date of Birth/Sex: [birth year] (83 y.o. M) Treating RN: Secundino Ginger Primary Care Provider: Pernell Dupre Other Clinician: Referring Provider: Harvest Dark Treating Provider/Extender: Tito Dine in Treatment: 0 Information Obtained From Patient Caregiver Wound History Do you currently have one or more open woundso Yes How many open wounds do you currently haveo 2 Approximately how long have you had your woundso 2 weeks How have you been treating your wound(s) until nowo soap and H2O Has your wound(s) ever healed and then re-openedo Yes Have you had any lab work done in the past montho No Have you tested positive for an  antibiotic resistant organism (MRSA, VRE)o No Have you tested positive for osteomyelitis (bone infection)o No Have you had any tests for circulation on your legso No Constitutional Symptoms (General Health) Complaints and Symptoms: Negative for: Fatigue; Fever; Chills; Marked Weight Change Eyes Complaints and Symptoms: Positive for: Glasses / Contacts - glasses Negative for: Dry Eyes; Vision Changes Ear/Nose/Mouth/Throat Complaints and Symptoms: Negative for: Difficult clearing ears; Sinusitis Medical History: Negative for: Chronic sinus problems/congestion; Middle ear problems Past Medical History Notes: c/o ear  wax Hematologic/Lymphatic Complaints and Symptoms: Negative for: Bleeding / Clotting Disorders; Human Immunodeficiency Virus Medical History: Negative for: Anemia; Hemophilia; Human Immunodeficiency Virus; Lymphedema; Sickle Cell Disease Respiratory Complaints and Symptoms: Negative for: Chronic or frequent coughs; Shortness of Breath Medical History: Positive for: Chronic Obstructive Pulmonary Disease (COPD) - 8 months MOUHAMADOU, GITTLEMAN. ([id number]) Negative for: Aspiration; Asthma; Pneumothorax; Sleep Apnea; Tuberculosis Cardiovascular Complaints and Symptoms: No Complaints or Symptoms Complaints and Symptoms: Negative for: Chest pain; LE edema Medical History: Negative for: Angina; Arrhythmia; Congestive Heart Failure; Coronary Artery Disease; Deep Vein Thrombosis; Hypertension; Hypotension; Myocardial Infarction; Peripheral Arterial Disease; Peripheral Venous Disease; Phlebitis; Vasculitis Gastrointestinal Complaints and Symptoms: Positive for: Nausea - emptying test tomorrow Negative for: Frequent diarrhea; Vomiting Medical History: Negative for: Cirrhosis ; Colitis; Crohnos; Hepatitis A; Hepatitis B; Hepatitis C Endocrine Complaints and Symptoms: Negative for: Hepatitis; Thyroid disease; Polydypsia (Excessive Thirst) Medical History: Positive for: Type II Diabetes - 7 years ago Negative for: Type I Diabetes Treated with: Diet Genitourinary Complaints and Symptoms: Negative for: Kidney failure/ Dialysis; Incontinence/dribbling Medical History: Negative for: End Stage Renal Disease Immunological Complaints and Symptoms: Negative for: Hives; Itching Medical History: Negative for: Lupus Erythematosus; Raynaudos; Scleroderma Integumentary (Skin) Complaints and Symptoms: Positive for: Wounds; Bleeding or bruising tendency; Breakdown Negative for: Swelling Medical History: Negative for: History of Burn; History of pressure wounds NGAI, PARCELL  ([id number]) Musculoskeletal Complaints and Symptoms: Positive for: Muscle Weakness - lt leg due to back injury in 95' Negative for: Muscle Pain Medical History: Negative for: Gout; Rheumatoid Arthritis; Osteoarthritis; Osteomyelitis Neurologic Complaints and Symptoms: Positive for: Numbness/parasthesias - left leg Negative for: Focal/Weakness Medical History: Positive for: Neuropathy - LEB Negative for: Dementia; Quadriplegia; Paraplegia; Seizure Disorder Oncologic Medical History: Negative for: Received Chemotherapy; Received Radiation Immunizations Pneumococcal Vaccine: Received Pneumococcal Vaccination: No Tetanus Vaccine: Last tetanus shot: 03/09/2016 Implantable Devices Family and Social History Cancer: Yes - Siblings; Diabetes: Yes - Father; Heart Disease: Yes - Mother,Father,Siblings; Hereditary Spherocytosis: No; Hypertension: No; Stroke: No; Thyroid Problems: No; Tuberculosis: No; Light tobacco smoker; Alcohol Use: Never; Drug Use: No History; Caffeine Use: Daily; Financial Concerns: No; Food, Clothing or Shelter Needs: No; Support System Lacking: No; Transportation Concerns: No; Advanced Directives: Yes - dorothy wife (Not Provided); Do not resuscitate: Yes (Not Provided) Electronic Signature(s) Signed: 11/28/2017 4:05:05 PM By: Secundino Ginger Signed: 11/28/2017 4:09:45 PM By: Linton Ham MD Entered By: Secundino Ginger on 11/28/2017 11:13:06 ROYE, GUSTAFSON ([id number]) -------------------------------------------------------------------------------- [place] Details Patient Name: Chase Mendez Date of Service: 11/28/2017 Medical Record Number: [id number] Patient Account Number: [id number] Date of Birth/Sex: [birth year] (83 y.o. M) Treating RN: Cornell Barman Primary Care Provider: Pernell Dupre Other Clinician: Referring Provider: Harvest Dark Treating Provider/Extender: Tito Dine in Treatment: 0 Diagnosis Coding ICD-10 Codes Code  Description S61.401D Unspecified open wound of right hand, subsequent encounter E11.621 Type 2 diabetes mellitus with foot ulcer L97.524 Non-pressure chronic ulcer of other part of left foot with necrosis of bone L03.116 Cellulitis of left lower limb Facility Procedures CPT4 Code:  [id number] Description: [phone] - WOUND CARE VISIT-LEV 5 EST PT Modifier: Quantity: 1 Physician Procedures CPT4 Code: 8768115 Description: WC PHYS LEVEL 3 o NEW PT ICD-10 Diagnosis Description B26.203T Unspecified open wound of right hand, subsequent encounter E11.621 Type 2 diabetes mellitus with foot ulcer L97.524 Non-pressure chronic ulcer of other part of left foot with  L03.116 Cellulitis of left lower limb Modifier: necrosis of bone Quantity: 1 Electronic Signature(s) Signed: 11/28/2017 12:54:43 PM By: Gretta Cool, BSN, RN, CWS, Kim RN, BSN Signed: 11/28/2017 4:09:45 PM By: Linton Ham MD Entered By: Gretta Cool, BSN, RN, CWS, Kim on 11/28/2017 12:54:43

## 2017-12-05 ENCOUNTER — Encounter: Payer: Medicare Other | Admitting: Internal Medicine

## 2017-12-05 DIAGNOSIS — S91115A Laceration without foreign body of left lesser toe(s) without damage to nail, initial encounter: Secondary | ICD-10-CM | POA: Diagnosis not present

## 2017-12-06 ENCOUNTER — Ambulatory Visit (HOSPITAL_BASED_OUTPATIENT_CLINIC_OR_DEPARTMENT_OTHER): Payer: Medicare Other | Admitting: Pain Medicine

## 2017-12-06 ENCOUNTER — Encounter: Payer: Self-pay | Admitting: Pain Medicine

## 2017-12-06 ENCOUNTER — Other Ambulatory Visit: Payer: Self-pay

## 2017-12-06 VITALS — BP 164/82 | HR 88 | Temp 97.9°F | Resp 16 | Ht 72.0 in | Wt 194.0 lb

## 2017-12-06 DIAGNOSIS — M79605 Pain in left leg: Secondary | ICD-10-CM | POA: Diagnosis not present

## 2017-12-06 DIAGNOSIS — M5442 Lumbago with sciatica, left side: Secondary | ICD-10-CM

## 2017-12-06 DIAGNOSIS — M961 Postlaminectomy syndrome, not elsewhere classified: Secondary | ICD-10-CM

## 2017-12-06 DIAGNOSIS — M5136 Other intervertebral disc degeneration, lumbar region: Secondary | ICD-10-CM

## 2017-12-06 DIAGNOSIS — G894 Chronic pain syndrome: Secondary | ICD-10-CM | POA: Diagnosis not present

## 2017-12-06 DIAGNOSIS — L03113 Cellulitis of right upper limb: Secondary | ICD-10-CM

## 2017-12-06 DIAGNOSIS — M21372 Foot drop, left foot: Secondary | ICD-10-CM

## 2017-12-06 DIAGNOSIS — M5417 Radiculopathy, lumbosacral region: Secondary | ICD-10-CM

## 2017-12-06 DIAGNOSIS — G8929 Other chronic pain: Secondary | ICD-10-CM

## 2017-12-06 NOTE — Progress Notes (Signed)
Patient's Name: Chase Mendez  MRN: [id number]  Referring Provider: Jodi Marble, MD  DOB: September 24, 1934  PCP: Jodi Marble, MD  DOS: 12/06/2017  Note by: Gaspar Cola, MD  Service setting: Ambulatory outpatient  Specialty: Interventional Pain Management  Location: [facility] (AMB) Pain Management Facility    Patient type: Established   Primary Reason(s) for Visit: Evaluation of chronic illnesses with exacerbation, or progression (Level of risk: moderate) CC: Back Pain (low)  HPI  Mr. Kelly is a 83 y.o. year old, male patient, who comes today for a follow-up evaluation. He has Pneumonia; ARF (acute renal failure) ([place]); Spinal accessory neuropathy; Varicose veins of both lower extremities with pain; Venous reflux; Lower limb pain, inferior, left (L5) (Foot Drop); Chronic low back pain (Secondary Area of Pain) (Bilateral) (ML) (L>R) with sciatica (Left); Chronic lower extremity pain (Primary Area of Pain) (Left); Failed back surgical syndrome; Foot drop (Left); Lumbosacral radiculopathy at L5; Disorder of skeletal system; Pharmacologic therapy; Problems influencing health status; Opioid Drug tolerance; History of acute renal failure (12/08/2014); History of encephalopathy (12/08/2014); History of sepsis (12/08/2014); DDD (degenerative disc disease), lumbar; Chronic pain syndrome; and Cellulitis of right hand on their problem list. Mr. Proch was last seen on 07/11/2017. His primarily concern today is the Back Pain (low)  Pain Assessment: Location:   Back Radiating: radiates down left hip and leg Onset: More than a month ago Duration: Chronic pain Quality: Shooting, Sharp Severity: 5 /10 (subjective, self-reported pain score)  Note: Reported level is inconsistent with clinical observations. Clinically the patient looks like a 3/10 A 3/10 is viewed as "Moderate" and described as significantly interfering with activities of daily living (ADL). It becomes difficult to feed, bathe, get dressed,  get on and off the toilet or to perform personal hygiene functions. Difficult to get in and out of bed or a chair without assistance. Very distracting. With effort, it can be ignored when deeply involved in activities. Mr. Dittmar does not seem to understand the use of our objective pain scale When using our objective Pain Scale, levels between 6 and 10/10 are said to belong in an emergency room, as it progressively worsens from a 6/10, described as severely limiting, requiring emergency care not usually available at an outpatient pain management facility. At a 6/10 level, communication becomes difficult and requires great effort. Assistance to reach the emergency department may be required. Facial flushing and profuse sweating along with potentially dangerous increases in heart rate and blood pressure will be evident. Effect on ADL: Limits activities Timing: Intermittent Modifying factors: medications help BP: (!) 164/82  HR: 88  The patient came into the clinic today ready to have a bilateral lumbar spinal cord stimulator trial done.  Unfortunately, he had recently fallen and cut his hand, and later developing a cellulitis.  He is currently on antibiotics for this and in view of the fact that this is an elective procedure and he is having an active infection, we have decided to postpone it to a later time.  Today we took the opportunity to talk to the patient about the spinal cord stimulator implant, and we went over the risk and possible complications into including the reason why we would be canceling today's procedure  Further details on both, my assessment(s), as well as the proposed treatment plan, please see below.  Laboratory Chemistry  Inflammation Markers (CRP: Acute Phase) (ESR: Chronic Phase) Lab Results  Component Value Date   LATICACIDVEN 1.1 12/08/2014  Rheumatology Markers No results found.  Renal Function Markers Lab Results  Component Value Date   BUN  16 01/25/2017   CREATININE 0.98 01/25/2017   GFRAA >60 01/25/2017   GFRNONAA >60 01/25/2017                             Hepatic Function Markers Lab Results  Component Value Date   AST 28 08/21/2016   ALT 18 08/21/2016   ALBUMIN 4.1 08/21/2016   ALKPHOS 112 08/21/2016   LIPASE 37 08/21/2016                        Electrolytes Lab Results  Component Value Date   NA 137 01/25/2017   K 3.2 (L) 01/25/2017   CL 99 (L) 01/25/2017   CALCIUM 9.2 01/25/2017                        Neuropathy Markers Lab Results  Component Value Date   HGBA1C 6.3 (H) 12/08/2014                        CNS Tests No results found.  Bone Pathology Markers No results found.  Coagulation Parameters Lab Results  Component Value Date   PLT 169 01/25/2017                        Cardiovascular Markers Lab Results  Component Value Date   TROPONINI <0.03 01/25/2017   HGB 16.6 01/25/2017   HCT 47.0 01/25/2017                         CA Markers No results found.  Note: Lab results reviewed.  Imaging Review  Lumbosacral Imaging: Lumbar MR wo contrast:  Results for orders placed during the hospital encounter of 05/24/17  MR LUMBAR SPINE WO CONTRAST   Narrative CLINICAL DATA:  Chronic left leg pain. Previous lumbar surgery. Chronic bilateral low back pain.  EXAM: MRI LUMBAR SPINE WITHOUT CONTRAST  TECHNIQUE: Multiplanar, multisequence MR imaging of the lumbar spine was performed. No intravenous contrast was administered.  COMPARISON:  CT 04/12/2009  FINDINGS: Segmentation:  5 lumbar type vertebral bodies.  Alignment: Straightening of the normal lumbar lordosis. Mild curvature convex towards the left.  Vertebrae: Benign appearing hemangioma within the left posterior inferior corner of the L1 vertebral body. Superior endplate Schmorl's node at L5.  Conus medullaris and cauda equina: Conus extends to the L1 level. Conus and cauda equina appear normal.  Paraspinal and other soft  tissues: Negative  Disc levels:  T11-12 and T12-L1: Normal appearance of the discs. Minimal facet hypertrophy. No stenosis.  L1-2: Endplate osteophytes and bulging of the disc. Bilateral facet degeneration and hypertrophy. No compressive stenosis.  L2-3: Disc bulge.  Mild facet hypertrophy.  No stenosis.  L3-4: Disc bulge. Mild facet hypertrophy. Mild narrowing of the lateral recesses but without visible neural compression.  L4-5: Bulging of the disc more prominent towards the right. Facet and ligamentous hypertrophy. Stenosis of the right lateral recess that could compress the right L5 nerve. Right L4 nerve appears to exit without compression.  L5-S1: Previous left hemilaminectomy. Endplate osteophytes and bulging of the disc towards the right. Mild facet degeneration. Mild narrowing of the subarticular lateral recess on the right. Foraminal narrowing on the left that could affect the exiting L5 nerve.  IMPRESSION: Multilevel degenerative disc disease and degenerative facet disease as outlined above. No central canal stenosis. Right lateral recess stenosis at L4-5 that could affect the right L5 nerve. Left foraminal stenosis at L5-S1 that could affect the left L5 nerve. Previous left hemilaminectomy at L5-S1.  Electronically Signed   By: Nelson Chimes M.D.   On: 05/24/2017 12:59    Foot Imaging: Foot-L DG Complete:  Results for orders placed during the hospital encounter of 11/26/17  DG Foot Complete Left   Narrative CLINICAL DATA:  83 year old male with 2nd toe pain after injury last night and earlier this month.  EXAM: LEFT FOOT - COMPLETE 3+ VIEW  COMPARISON:  11/08/2017 right foot series  FINDINGS: Diastasis of the left 2nd DIP joint. Possible nondisplaced intra-articular fracture along the lateral aspect of the base of the 2nd distal phalanx. No definite fracture of the 2nd middle phalanx.  Elsewhere stable radiographic appearance of the left foot  tarsal bones appear intact and normally aligned.  IMPRESSION: 1. Diastasis of the left 2nd DIP joint with possible nondisplaced intra-articular fracture at the lateral base the distal phalanx. 2. No other acute fracture or dislocation identified about the left foot.  Electronically Signed   By: Genevie Ann M.D.   On: 11/26/2017 12:38    Complexity Note: Imaging results reviewed. Results shared with Mr. Turgeon, using Layman's terms.                         Meds   Current Outpatient Medications:  .  allopurinol (ZYLOPRIM) 100 MG tablet, Take 100 mg by mouth daily., Disp: , Rfl: 0 .  aspirin EC 81 MG tablet, Take 81 mg by mouth daily., Disp: , Rfl:  .  atorvastatin (LIPITOR) 10 MG tablet, Take 10 mg by mouth every evening., Disp: , Rfl: 0 .  citalopram (CELEXA) 20 MG tablet, Take 20 mg by mouth daily., Disp: , Rfl:  .  clindamycin (CLEOCIN) 300 MG capsule, Take 1 capsule (300 mg total) by mouth 3 (three) times daily for 10 days., Disp: 30 capsule, Rfl: 0 .  Cyanocobalamin 1000 MCG/ML LIQD, Take 1,000 mg by mouth daily., Disp: , Rfl:  .  diphenhydrAMINE (BENADRYL) 25 MG tablet, Take 25 mg by mouth every 6 (six) hours as needed., Disp: , Rfl:  .  fluticasone (FLONASE) 50 MCG/ACT nasal spray, fluticasone propionate 50 mcg/act susp, Disp: , Rfl:  .  gabapentin (NEURONTIN) 400 MG capsule, Take 400 mg by mouth 3 (three) times daily., Disp: , Rfl:  .  isosorbide dinitrate (ISORDIL) 30 MG tablet, Take 30 mg by mouth 3 (three) times daily., Disp: , Rfl:  .  levothyroxine (SYNTHROID, LEVOTHROID) 150 MCG tablet, Take 150 mcg by mouth every morning., Disp: , Rfl: 0 .  linaclotide (LINZESS) 290 MCG CAPS capsule, Take 290 mcg by mouth daily before breakfast., Disp: , Rfl:  .  losartan (COZAAR) 50 MG tablet, Take 50 mg by mouth daily., Disp: , Rfl:  .  mirabegron ER (MYRBETRIQ) 25 MG TB24 tablet, Take 25 mg by mouth daily., Disp: , Rfl:  .  naloxone (NARCAN) nasal spray 4 mg/0.1 mL, Spray into one  nostril. Repeat with second device into other nostril after 2-3 minutes if no or minimal response. Use in case of opioid overdose., Disp: 1 kit, Rfl: 0 .  omeprazole (PRILOSEC) 20 MG capsule, Take 20 mg by mouth daily., Disp: , Rfl:  .  ondansetron (ZOFRAN ODT) 4 MG disintegrating tablet, Take  1 tablet (4 mg total) by mouth every 8 (eight) hours as needed for nausea or vomiting., Disp: 20 tablet, Rfl: 0 .  [START ON 12/22/2017] oxyCODONE (ROXICODONE) 15 MG immediate release tablet, Take 1 tablet (15 mg total) by mouth every 6 (six) hours as needed for pain., Disp: 120 tablet, Rfl: 0 .  oxyCODONE (ROXICODONE) 15 MG immediate release tablet, Take 1 tablet (15 mg total) by mouth every 6 (six) hours as needed for pain., Disp: 120 tablet, Rfl: 0 .  testosterone (ANDROGEL) 50 MG/5GM (1%) GEL, Place onto the skin daily., Disp: , Rfl:  .  vitamin E 400 UNIT capsule, Take 400 Units by mouth daily., Disp: , Rfl:   ROS  Constitutional: Denies any fever or chills Gastrointestinal: No reported hemesis, hematochezia, vomiting, or acute GI distress Musculoskeletal: Denies any acute onset joint swelling, redness, loss of ROM, or weakness Neurological: No reported episodes of acute onset apraxia, aphasia, dysarthria, agnosia, amnesia, paralysis, loss of coordination, or loss of consciousness  Allergies  Mr. Zukas is allergic to ibuprofen; sulfa antibiotics; sulfasalazine; and lidocaine.  [place]  Drug: Mr. Kirksey  reports that he does not use drugs. Alcohol:  reports that he does not drink alcohol. Tobacco:  reports that he has been smoking cigars. He has never used smokeless tobacco. Medical:  has a past medical history of Acute encephalopathy (12/08/2014), Anxiety, ARF (acute renal failure) ([place]) (12/08/2014), Back pain, Benign neoplasm of large bowel, Capsulitis, Chronic back pain, Depression, Diabetes mellitus without complication ([place]), Dysphagia, Exostosis, GERD (gastroesophageal reflux disease), Gout,  Hypothyroidism, Insomnia, Low testosterone, Microscopic hematuria, Pneumonia (12/07/2014), Pressure ulcer (12/09/2014), Sepsis ([place]) (12/08/2014), and Thyroid disease. Surgical: Mr. Knebel  has a past surgical history that includes Cholecystectomy; Appendectomy; Back surgery; Eye surgery; and Esophagogastroduodenoscopy (egd) with propofol (N/A, 04/24/2017). Family: family history includes Diabetes in his father; Heart disease in his mother.  Constitutional Exam  General appearance: Well nourished, well developed, and well hydrated. In no apparent acute distress Vitals:   12/06/17 0924 12/06/17 0928  BP:  (!) 164/82  Pulse: 88   Resp: 16   Temp: 97.9 F (36.6 C)   SpO2: 99%   Weight: 194 lb (88 kg)   Height: 6' (1.829 m)    BMI Assessment: Estimated body mass index is 26.31 kg/m as calculated from the following:   Height as of this encounter: 6' (1.829 m).   Weight as of this encounter: 194 lb (88 kg).  Assessment  Primary Diagnosis & Pertinent Problem List: The primary encounter diagnosis was Chronic pain syndrome. Diagnoses of Failed back surgical syndrome, Chronic lower extremity pain (Primary Area of Pain) (Left), Chronic low back pain (Secondary Area of Pain) (Bilateral) (ML) (L>R) with sciatica (Left), Foot drop (Left), Lower limb pain, inferior, left (L5) (Foot Drop), DDD (degenerative disc disease), lumbar, Lumbosacral radiculopathy at L5, and Cellulitis of right hand were also pertinent to this visit.  Status Diagnosis  Controlled Persistent Persistent 1. Chronic pain syndrome   2. Failed back surgical syndrome   3. Chronic lower extremity pain (Primary Area of Pain) (Left)   4. Chronic low back pain (Secondary Area of Pain) (Bilateral) (ML) (L>R) with sciatica (Left)   5. Foot drop (Left)   6. Lower limb pain, inferior, left (L5) (Foot Drop)   7. DDD (degenerative disc disease), lumbar   8. Lumbosacral radiculopathy at L5   9. Cellulitis of right hand     Problems updated  and reviewed during this visit: Problem  Chronic low back pain (  Secondary Area of Pain) (Bilateral) (ML) (L>R) with sciatica (Left)  Chronic lower extremity pain (Primary Area of Pain) (Left)  Disorder of Skeletal System  Pharmacologic Therapy  Problems Influencing Health Status  Cellulitis of Right Hand   Plan of Care  Pharmacotherapy (Medications Ordered): No orders of the defined types were placed in this encounter.  Medications administered today: Evert Kohl. Oestreich had no medications administered during this visit.   Procedure Orders     [facility] TRIAL Lab Orders  No laboratory test(s) ordered today   Imaging Orders  No imaging studies ordered today   Referral Orders  No referral(s) requested today   Interventional management options: Planned, scheduled, and/or pending:   Reschedule Lumbar spinal cord stimulator trial to be done after his cellulitis has completely cleared.   Considering:   Diagnostic caudal epidural steroid injection + epidurogram Possible Racz procedure Diagnostic bilateral lumbar facet block Possible bilateral lumbar facet RFA Possible candidate for intrathecal pump trial and implant Possible candidate for spinal cord stimulator trial and implant   Palliative PRN treatment(s):   Diagnostic left-sided L4 and [id number]   Provider-requested follow-up: Return for reschedule.  Future Appointments  Date Time Provider [place]  12/12/2017  8:15 AM Ricard Dillon, MD [facility] None  12/19/2017  8:15 AM Ricard Dillon, MD [facility] None  01/16/2018  9:45 AM Vevelyn Francois, NP [facility] None   Primary Care Physician: Jodi Marble, MD Location: [facility] Note by: Gaspar Cola, MD Date: 12/06/2017; Time: 10:28 AM

## 2017-12-06 NOTE — Patient Instructions (Addendum)
____________________________________________________________________________________________  Preparing for Procedure with Sedation  Instructions: . Oral Intake: Do not eat or drink anything for at least 8 hours prior to your procedure. . Transportation: Public transportation is not allowed. Bring an adult driver. The driver must be physically present in our waiting room before any procedure can be started. . Physical Assistance: Bring an adult physically capable of assisting you, in the event you need help. This adult should keep you company at home for at least 6 hours after the procedure. . Blood Pressure Medicine: Take your blood pressure medicine with a sip of water the morning of the procedure. . Blood thinners: Notify our staff if you are taking any blood thinners. Depending on which one you take, there will be specific instructions on how and when to stop it. . Diabetics on insulin: Notify the staff so that you can be scheduled 1st case in the morning. If your diabetes requires high dose insulin, take only  of your normal insulin dose the morning of the procedure and notify the staff that you have done so. . Preventing infections: Shower with an antibacterial soap the morning of your procedure. . Build-up your immune system: Take 1000 mg of Vitamin C with every meal (3 times a day) the day prior to your procedure. . Antibiotics: Inform the staff if you have a condition or reason that requires you to take antibiotics before dental procedures. . Pregnancy: If you are pregnant, call and cancel the procedure. . Sickness: If you have a cold, fever, or any active infections, call and cancel the procedure. . Arrival: You must be in the facility at least 30 minutes prior to your scheduled procedure. . Children: Do not bring children with you. . Dress appropriately: Bring dark clothing that you would not mind if they get stained. . Valuables: Do not bring any jewelry or valuables.  Procedure  appointments are reserved for interventional treatments only. . No Prescription Refills. . No medication changes will be discussed during procedure appointments. . No disability issues will be discussed.  Reasons to call and reschedule or cancel your procedure: (Following these recommendations will minimize the risk of a serious complication.) . Surgeries: Avoid having procedures within 2 weeks of any surgery. (Avoid for 2 weeks before or after any surgery). . Flu Shots: Avoid having procedures within 2 weeks of a flu shots or . (Avoid for 2 weeks before or after immunizations). . Barium: Avoid having a procedure within 7-10 days after having had a radiological study involving the use of radiological contrast. (Myelograms, Barium swallow or enema study). . Heart attacks: Avoid any elective procedures or surgeries for the initial 6 months after a "Myocardial Infarction" (Heart Attack). . Blood thinners: It is imperative that you stop these medications before procedures. Let us know if you if you take any blood thinner.  . Infection: Avoid procedures during or within two weeks of an infection (including chest colds or gastrointestinal problems). Symptoms associated with infections include: Localized redness, fever, chills, night sweats or profuse sweating, burning sensation when voiding, cough, congestion, stuffiness, runny nose, sore throat, diarrhea, nausea, vomiting, cold or Flu symptoms, recent or current infections. It is specially important if the infection is over the area that we intend to treat. . Heart and lung problems: Symptoms that may suggest an active cardiopulmonary problem include: cough, chest pain, breathing difficulties or shortness of breath, dizziness, ankle swelling, uncontrolled high or unusually low blood pressure, and/or palpitations. If you are experiencing any of these symptoms, cancel   your procedure and contact your primary care physician for an evaluation.  Remember:   Regular Business hours are:  Monday to Thursday 8:00 AM to 4:00 PM  Provider's Schedule: Elinda Bunten, MD:  Procedure days: Tuesday and Thursday 7:30 AM to 4:00 PM  Bilal Lateef, MD:  Procedure days: Monday and Wednesday 7:30 AM to 4:00 PM ____________________________________________________________________________________________    

## 2017-12-06 NOTE — Progress Notes (Signed)
Safety precautions to be maintained throughout the outpatient stay will include: orient to surroundings, keep bed in low position, maintain call bell within reach at all times, provide assistance with transfer out of bed and ambulation.  

## 2017-12-08 NOTE — Progress Notes (Signed)
[name], [name] ([id number]) Visit Report for 12/05/2017 Arrival Information Details Patient Name: Chase Mendez, Chase Mendez Date of Service: 12/05/2017 8:15 AM Medical Record Number: [id number] Patient Account Number: [id number] Date of Birth/Sex: 08-Sep-1934 (83 y.o. M) Treating RN: Cornell Barman Primary Care Jenniferlynn Saad: Pernell Dupre Other Clinician: Referring Tagen Brethauer: Pernell Dupre Treating Masa Lubin/Extender: Tito Dine in Treatment: 1 Visit Information History Since Last Visit Added or deleted any medications: No Patient Arrived: [place] Any new allergies or adverse reactions: No Arrival Time: 08:10 Had a fall or experienced change in No Accompanied By: wife activities of daily living that may affect Transfer Assistance: None risk of falls: Patient Identification Verified: Yes Signs or symptoms of abuse/neglect since last visito No Secondary Verification Process Completed: Yes Hospitalized since last visit: No Implantable device outside of the clinic excluding No cellular tissue based products placed in the center since last visit: Has Dressing in Place as Prescribed: Yes Pain Present Now: No Electronic Signature(s) Signed: 12/05/2017 4:40:44 PM By: Lorine Bears RCP, RRT, CHT Entered By: Lorine Bears on 12/05/2017 08:12:03 Chase Mendez ([id number]) -------------------------------------------------------------------------------- Lower Extremity Assessment Details Patient Name: Chase Mendez Date of Service: 12/05/2017 8:15 AM Medical Record Number: [id number] Patient Account Number: [id number] Date of Birth/Sex: 1934-11-05 (83 y.o. M) Treating RN: Cornell Barman Primary Care Paysen Goza: Pernell Dupre Other Clinician: Referring Edmund Rick: Pernell Dupre Treating Clevon Khader/Extender: Tito Dine in Treatment: 1 Edema Assessment Assessed: [Left: No] [Right: No] Edema: [Left: N] [Right: o] Vascular  Assessment Pulses: Dorsalis Pedis Palpable: [Left:Yes] Posterior Tibial Extremity colors, hair growth, and conditions: Extremity Color: [Left:Normal] Hair Growth on Extremity: [Left:No] Temperature of Extremity: [Left:Warm] Capillary Refill: [Left:< 3 seconds] Toe Nail Assessment Left: Right: Thick: Yes Discolored: Yes Deformed: Yes Improper Length and Hygiene: No Electronic Signature(s) Signed: 12/06/2017 7:26:08 AM By: Gretta Cool, BSN, RN, CWS, Kim RN, BSN Entered By: Gretta Cool, BSN, RN, CWS, Kim on 12/05/2017 08:17:50 Chase Mendez ([id number]) -------------------------------------------------------------------------------- Multi Wound Chart Details Patient Name: Chase Mendez Date of Service: 12/05/2017 8:15 AM Medical Record Number: [id number] Patient Account Number: [id number] Date of Birth/Sex: [birth year] (83 y.o. M) Treating RN: Cornell Barman Primary Care Oziah Vitanza: Pernell Dupre Other Clinician: Referring Gailene Youkhana: Pernell Dupre Treating Saniyah Mondesir/Extender: Tito Dine in Treatment: 1 Vital Signs Height(in): 72 Pulse(bpm): 55 Weight(lbs): 192.7 Blood Pressure(mmHg): 146/64 Body Mass Index(BMI): 26 Temperature(F): 98.0 Respiratory Rate 16 (breaths/min): Photos: [1:No Photos] [2:No Photos] [N/A:N/A] Wound Location: [1:Left, Circumferential Toe Second] [2:Right, Dorsal Hand - Palm] [N/A:N/A] Wounding Event: [1:Trauma] [2:Trauma] [N/A:N/A] Primary Etiology: [1:Trauma, Other] [2:Trauma, Other] [N/A:N/A] Date Acquired: [1:11/08/2017] [2:11/14/2017] [N/A:N/A] Weeks of Treatment: [1:1] [2:1] [N/A:N/A] Wound Status: [1:Open] [2:Open] [N/A:N/A] Measurements L x W x D [1:0.2x2x0.1] [2:1x2x0.2] [N/A:N/A] (cm) Area (cm) : [1:0.314] [2:1.571] [N/A:N/A] Volume (cm) : [1:0.031] [2:0.314] [N/A:N/A] % Reduction in Area: [1:68.00%] [2:-25.00%] [N/A:N/A] % Reduction in Volume: [1:68.40%] [2:58.40%] [N/A:N/A] Classification: [1:Full Thickness Without Exposed  Support Structures] [2:Full Thickness Without Exposed Support Structures] [N/A:N/A] Debridement: [1:Debridement - Excisional] [2:Debridement - Selective/Open Wound] [N/A:N/A] Pre-procedure [1:08:46] [2:08:46] [N/A:N/A] Verification/Time Out Taken: Pain Control: [1:Lidocaine] [2:Lidocaine] [N/A:N/A] Tissue Debrided: [1:Subcutaneous, Slough] [2:Callus] [N/A:N/A] Level: [1:Skin/Subcutaneous Tissue] [2:Skin/Dermis] [N/A:N/A] Debridement Area (sq cm): [1:0.4] [2:2] [N/A:N/A] Instrument: [1:Curette] [2:Curette] [N/A:N/A] Bleeding: [1:Minimum] [2:Minimum] [N/A:N/A] Hemostasis Achieved: [1:Pressure] [2:Pressure] [N/A:N/A] Procedural Pain: [1:1] [2:1] [N/A:N/A] Debridement Treatment [1:Procedure was tolerated well] [2:Procedure was tolerated well] [N/A:N/A] Response: Post Debridement [1:0.2x2x0.2] [2:1x2x0.1] [N/A:N/A] Measurements L x W x D (cm) Post Debridement Volume: [1:0.063] [2:0.157] [N/A:N/A] (cm) Periwound Skin Texture: [1:No Abnormalities Noted] [2:No  Abnormalities Noted] [N/A:N/A] Periwound Skin Moisture: [1:No Abnormalities Noted] [2:No Abnormalities Noted] [N/A:N/A] Periwound Skin Color: No Abnormalities Noted No Abnormalities Noted N/A Tenderness on Palpation: No No N/A Procedures Performed: Debridement Debridement N/A Treatment Notes Electronic Signature(s) Signed: 12/05/2017 5:32:05 PM By: Linton Ham MD Entered By: Linton Ham on 12/05/2017 09:00:05 Chase Mendez ([id number]) -------------------------------------------------------------------------------- [place] Details Patient Name: Chase Mendez Date of Service: 12/05/2017 8:15 AM Medical Record Number: [id number] Patient Account Number: [id number] Date of Birth/Sex: 1934-11-29 (83 y.o. M) Treating RN: Cornell Barman Primary Care Colbi Staubs: Pernell Dupre Other Clinician: Referring Arlen Legendre: Pernell Dupre Treating Permelia Bamba/Extender: Tito Dine in Treatment: 1 Active  Inactive ` Abuse / Safety / Falls / Self Care Management Nursing Diagnoses: History of Falls Impaired physical mobility Potential for falls Goals: Patient will remain injury free related to falls Date Initiated: 11/28/2017 Target Resolution Date: 12/12/2017 Goal Status: Active Patient/caregiver will verbalize understanding of skin care regimen Date Initiated: 11/28/2017 Target Resolution Date: 12/12/2017 Goal Status: Active Interventions: Assess fall risk on admission and as needed Notes: ` Medication Nursing Diagnoses: Knowledge deficit related to medication safety: actual or potential Goals: Patient/caregiver will demonstrate understanding of all current medications Date Initiated: 11/28/2017 Target Resolution Date: 12/12/2017 Goal Status: Active Patient/caregiver will demonstrate understanding of new oral/IV medications prescribed at the [facility] (topical prescriptions are covered under the skin breakdown problem) Date Initiated: 11/28/2017 Target Resolution Date: 12/12/2017 Goal Status: Active Interventions: Assess for medication contraindications each visit where new medications are prescribed Notes: ` Orientation to the [facility] JOHANAN, [place]ORU[place]. ([id number]) Nursing Diagnoses: Knowledge deficit related to the wound healing center program Goals: Patient/caregiver will verbalize understanding of the [place] Program Date Initiated: 11/28/2017 Target Resolution Date: 12/12/2017 Goal Status: Active Interventions: Provide education on orientation to the wound center Notes: ` Wound/Skin Impairment Nursing Diagnoses: Impaired tissue integrity Goals: Ulcer/skin breakdown will have a volume reduction of 30% by week 4 Date Initiated: 11/28/2017 Target Resolution Date: 12/12/2017 Goal Status: Active Interventions: Assess ulceration(s) every visit Treatment Activities: Referred to DME Kristiann Noyce for dressing supplies : 11/28/2017 Skin care regimen  initiated : 11/28/2017 Notes: Electronic Signature(s) Signed: 12/06/2017 7:26:08 AM By: Gretta Cool, BSN, RN, CWS, Kim RN, BSN Entered By: Gretta Cool, BSN, RN, CWS, Kim on 12/05/2017 08:17:55 Chase Mendez ([id number]) -------------------------------------------------------------------------------- Pain Assessment Details Patient Name: Chase Mendez Date of Service: 12/05/2017 8:15 AM Medical Record Number: [id number] Patient Account Number: [id number] Date of Birth/Sex: Jun 29, 1934 (83 y.o. M) Treating RN: Cornell Barman Primary Care Larayne Baxley: Pernell Dupre Other Clinician: Referring Ranger Petrich: Pernell Dupre Treating Keontae Levingston/Extender: Tito Dine in Treatment: 1 Active Problems Location of Pain Severity and Description of Pain Patient Has Paino No Site Locations Pain Management and Medication Current Pain Management: Electronic Signature(s) Signed: 12/05/2017 4:40:44 PM By: Lorine Bears RCP, RRT, CHT Signed: 12/06/2017 7:26:08 AM By: Gretta Cool, BSN, RN, CWS, Kim RN, BSN Entered By: Lorine Bears on 12/05/2017 08:12:12 DAGO, JUNGWIRTH ([id number]) -------------------------------------------------------------------------------- Patient/Caregiver Education Details Patient Name: Chase Mendez Date of Service: 12/05/2017 8:15 AM Medical Record Number: [id number] Patient Account Number: [id number] Date of Birth/Gender: 05-Nov-1934 (83 y.o. M) Treating RN: Cornell Barman Primary Care Physician: Pernell Dupre Other Clinician: Referring Physician: Pernell Dupre Treating Physician/Extender: Tito Dine in Treatment: 1 Education Assessment Education Provided To: Patient Education Topics Provided Wound/Skin Impairment: Handouts: Caring for Your Ulcer, Other: Continue wound care as prescribed Methods: Demonstration Responses: State content correctly Electronic Signature(s) Signed: 12/06/2017 7:26:08 AM By: Gretta Cool, BSN,  RN, CWS, Kim  RN, BSN Entered By: Gretta Cool, BSN, RN, CWS, Kim on 12/05/2017 08:56:12 Chase Mendez ([id number]) -------------------------------------------------------------------------------- Wound Assessment Details Patient Name: BRAILON, DON Date of Service: 12/05/2017 8:15 AM Medical Record Number: [id number] Patient Account Number: [id number] Date of Birth/Sex: 11-06-1934 (83 y.o. M) Treating RN: Cornell Barman Primary Care Toma Erichsen: Pernell Dupre Other Clinician: Referring [place]: Pernell Dupre Treating Sherron Mapp/Extender: Tito Dine in Treatment: 1 Wound Status Wound Number: 1 Primary Etiology: Trauma, Other Wound Location: Left, Circumferential Toe Second Wound Status: Open Wounding Event: Trauma Date Acquired: 11/08/2017 Weeks Of Treatment: 1 Clustered Wound: No Photos Photo Uploaded By: Gretta Cool, BSN, RN, CWS, Kim on 12/05/2017 16:41:47 Wound Measurements Length: (cm) 0.2 Width: (cm) 2 Depth: (cm) 0.1 Area: (cm) 0.314 Volume: (cm) 0.031 % Reduction in Area: 68% % Reduction in Volume: 68.4% Wound Description Full Thickness Without Exposed Support Classification: Structures Periwound Skin Texture Texture Color No Abnormalities Noted: No No Abnormalities Noted: No Moisture No Abnormalities Noted: No Electronic Signature(s) Signed: 12/06/2017 7:26:08 AM By: Gretta Cool, BSN, RN, CWS, Kim RN, BSN Entered By: Gretta Cool, BSN, RN, CWS, Kim on 12/05/2017 08:17:01 Chase Mendez ([id number]) -------------------------------------------------------------------------------- Wound Assessment Details Patient Name: Chase Mendez Date of Service: 12/05/2017 8:15 AM Medical Record Number: [id number] Patient Account Number: [id number] Date of Birth/Sex: 02-Jun-1934 (83 y.o. M) Treating RN: Cornell Barman Primary Care Alycen Mack: Pernell Dupre Other Clinician: Referring Jerick Khachatryan: Pernell Dupre Treating Franki Alcaide/Extender: Tito Dine in Treatment: 1 Wound  Status Wound Number: 2 Primary Etiology: Trauma, Other Wound Location: Right, Dorsal Hand - Palm Wound Status: Open Wounding Event: Trauma Date Acquired: 11/14/2017 Weeks Of Treatment: 1 Clustered Wound: No Photos Photo Uploaded By: Gretta Cool, BSN, RN, CWS, Kim on 12/05/2017 16:41:48 Wound Measurements Length: (cm) 1 Width: (cm) 2 Depth: (cm) 0.2 Area: (cm) 1.571 Volume: (cm) 0.314 % Reduction in Area: -25% % Reduction in Volume: 58.4% Wound Description Full Thickness Without Exposed Support Classification: Structures Periwound Skin Texture Texture Color No Abnormalities Noted: No No Abnormalities Noted: No Moisture No Abnormalities Noted: No Electronic Signature(s) Signed: 12/06/2017 7:26:08 AM By: Gretta Cool, BSN, RN, CWS, Kim RN, BSN Entered By: Gretta Cool, BSN, RN, CWS, Kim on 12/05/2017 08:17:01 Chase Mendez ([id number]) -------------------------------------------------------------------------------- Vitals Details Patient Name: Chase Mendez Date of Service: 12/05/2017 8:15 AM Medical Record Number: [id number] Patient Account Number: [id number] Date of Birth/Sex: [birth year] (83 y.o. M) Treating RN: Cornell Barman Primary Care Raynie Steinhaus: Pernell Dupre Other Clinician: Referring Javad Salva: Pernell Dupre Treating Delman Goshorn/Extender: Tito Dine in Treatment: 1 Vital Signs Time Taken: 08:10 Temperature (F): 98.0 Height (in): 72 Pulse (bpm): 55 Weight (lbs): 192.7 Respiratory Rate (breaths/min): 16 Body Mass Index (BMI): 26.1 Blood Pressure (mmHg): 146/64 Reference Range: 80 - 120 mg / dl Electronic Signature(s) Signed: 12/05/2017 4:40:44 PM By: Lorine Bears RCP, RRT, CHT Entered By: Lorine Bears on 12/05/2017 08:14:49

## 2017-12-08 NOTE — Progress Notes (Signed)
Chase Mendez ([id number]) Visit Report for 12/05/2017 Debridement Details Patient Name: Chase Mendez, Chase Mendez Date of Service: 12/05/2017 8:15 AM Medical Record Number: [id number] Patient Account Number: [id number] Date of Birth/Sex: 13-Feb-1934 (83 y.o. M) Treating RN: Cornell Barman Primary Care Provider: Pernell Dupre Other Clinician: Referring Provider: Pernell Dupre Treating Provider/Extender: Tito Dine in Treatment: 1 Debridement Performed for Wound #1 Left,Circumferential Toe Second Assessment: Performed By: Physician Ricard Dillon, MD Debridement Type: Debridement Level of Consciousness (Pre- Awake and Alert procedure): Pre-procedure Verification/Time Yes - 08:46 Out Taken: Start Time: 08:46 Pain Control: Lidocaine Total Area Debrided (L x W): 0.2 (cm) x 2 (cm) = 0.4 (cm) Tissue and other material Viable, Slough, Subcutaneous, Slough debrided: Level: Skin/Subcutaneous Tissue Debridement Description: Excisional Instrument: Curette Bleeding: Minimum Hemostasis Achieved: Pressure End Time: 08:50 Procedural Pain: 1 Response to Treatment: Procedure was tolerated well Level of Consciousness Awake and Alert (Post-procedure): Post Debridement Measurements of Total Wound Length: (cm) 0.2 Width: (cm) 2 Depth: (cm) 0.2 Volume: (cm) 0.063 Character of Wound/Ulcer Post Debridement: Stable Post Procedure Diagnosis Same as Pre-procedure Electronic Signature(s) Signed: 12/05/2017 5:32:05 PM By: Linton Ham MD Signed: 12/06/2017 7:26:08 AM By: Gretta Cool, BSN, RN, CWS, Kim RN, BSN Entered By: Linton Ham on 12/05/2017 09:00:18 Chase Mendez ([id number]) -------------------------------------------------------------------------------- Debridement Details Patient Name: Chase Mendez Date of Service: 12/05/2017 8:15 AM Medical Record Number: [id number] Patient Account Number: [id number] Date of Birth/Sex: 1934-02-29 (83 y.o. M) Treating RN:  Cornell Barman Primary Care Provider: Pernell Dupre Other Clinician: Referring Provider: Pernell Dupre Treating Provider/Extender: Tito Dine in Treatment: 1 Debridement Performed for Wound #2 Right,Dorsal Hand - Palm Assessment: Performed By: Physician Ricard Dillon, MD Debridement Type: Debridement Level of Consciousness (Pre- Awake and Alert procedure): Pre-procedure Verification/Time Yes - 08:46 Out Taken: Start Time: 08:46 Pain Control: Lidocaine Total Area Debrided (L x W): 1 (cm) x 2 (cm) = 2 (cm) Tissue and other material Non-Viable, Callus, Skin: Dermis debrided: Level: Skin/Dermis Debridement Description: Selective/Open Wound Instrument: Curette Bleeding: Minimum Hemostasis Achieved: Pressure End Time: 08:50 Procedural Pain: 1 Response to Treatment: Procedure was tolerated well Level of Consciousness Awake and Alert (Post-procedure): Post Debridement Measurements of Total Wound Length: (cm) 1 Width: (cm) 2 Depth: (cm) 0.1 Volume: (cm) 0.157 Character of Wound/Ulcer Post Debridement: Stable Post Procedure Diagnosis Same as Pre-procedure Electronic Signature(s) Signed: 12/05/2017 5:32:05 PM By: Linton Ham MD Signed: 12/06/2017 7:26:08 AM By: Gretta Cool, BSN, RN, CWS, Kim RN, BSN Entered By: Linton Ham on 12/05/2017 09:00:34 Chase Mendez ([id number]) -------------------------------------------------------------------------------- HPI Details Patient Name: Chase Mendez Date of Service: 12/05/2017 8:15 AM Medical Record Number: [id number] Patient Account Number: [id number] Date of Birth/Sex: 08-29-1934 (83 y.o. M) Treating RN: Cornell Barman Primary Care Provider: Pernell Dupre Other Clinician: Referring Provider: Pernell Dupre Treating Provider/Extender: Tito Dine in Treatment: 1 History of Present Illness HPI Description: ADMISSION 11/28/17 This is an 83 year old man who had an initial fall on  October 3. Apparently he suffered a laceration of the left second toe that required 5 sutures. The sutures were removed in mid October. The patient traumatized the toe again 4 days ago while walking on a carpet. He apparently has left foot drop and he catches the forefoot when he walks. This reopened the toe. He was seen in the ER yesterday. An x-ray done of the toes suggested a fracture at the lateral base of the distal phalanx that was apparently well approximated. He was also noted to have an infection in the  toe/cellulitis and was started on clindamycin. With regards to his right hand he had another fall on October 9. He suffered a laceration with a flap dehiscence over the Hypo-thenar eminence just distal to the wrist. He did not get this sutured however sometime after this he saw his primary care physician who tried to use Dermabond on this but it did not adhere. He is a diabetic but is on diet alone. His metformin was stopped when his blood sugars were in the normal range and his hemoglobin A1c was in the mid fives. He has foot drop secondary to apparently back issues. He has had a prior history of L5 radiculopathy. Beside this he has chronic gastritis hypothyroidism gastroesophageal reflux disease ABIs in our clinic were 1.13 bilaterally 12/05/17; follow-up for a patient that we admitted last week who had 2 fall and laceration injuries 1 on the hypo-thenar eminence of the right hand and the second on the left second toe. The left second toe required sutures which healed but then he retraumatized it.[name] He also had been started on clindamycin in the emergency room when we saw him last week for cellulitis of the second toe that extended into the proximal dorsal foot. I don't believe there was an actual culture. We used Iodoflex last week The area on the hypo-thenar eminence of the right hand is an interesting flap of tissue that failed Dermabond adhesion in his primary care office. Normally  flaps of skin and subcutaneous tissue like this become ischemic however for some reason this is actually remaining viable. We put Iodoflex under it last week And compression to hopefully form adhesion. I had some thoughts that the flap of tissue would have to be removed and the wound would have to be healed from the bottom this in terms of promoting granulation Electronic Signature(s) Signed: 12/05/2017 5:32:05 PM By: Linton Ham MD Entered By: Linton Ham on 12/05/2017 09:03:56 Chase Mendez ([id number]) -------------------------------------------------------------------------------- Physical Exam Details Patient Name: Chase Mendez Date of Service: 12/05/2017 8:15 AM Medical Record Number: [id number] Patient Account Number: [id number] Date of Birth/Sex: 03-27-1934 (83 y.o. M) Treating RN: Cornell Barman Primary Care Provider: Pernell Dupre Other Clinician: Referring Provider: Pernell Dupre Treating Provider/Extender: Tito Dine in Treatment: 1 Constitutional Patient is hypertensive.. Pulse regular and within target range for patient.[name] Respirations regular, non-labored and within target range.. Temperature is normal and within the target range for the patient.[name] appears in no distress. Notes Wound exam #1 right hyperthenar eminence. Miraculously this flap of tissue as adhered to the base of the wound however is also extending above the tissue plane of the skin around it. This continues to be a somewhat odd occurrence nevertheless the tissue itself looks completely viable there is no evidence of ischemia of the flap and at this point since the patient is not concerned about the cosmetics of what this looks like it would be difficult to recommend removing it. Using a #3 curet I removed callus and nonviable skin from the circumference of this. It is only open superiorly with the rest of this area adhering nicely. He is going to leave him almost with a raised edge  of tissue around the surrounding skin however he doesn't seem concerned about that #2 left second toe. Much less swollen and much less erythematous. The area I marked at the base of the toe dorsally as receded nicely. He still has a linear slitlike wound over the DIP. Using a #5 curet I remove necrotic debris from the  wound base. This really looks quite healthy and is titrating up nicely. Electronic Signature(s) Signed: 12/05/2017 5:32:05 PM By: Linton Ham MD Entered By: Linton Ham on 12/05/2017 09:07:00 Chase Mendez ([id number]) -------------------------------------------------------------------------------- Physician Orders Details Patient Name: Chase Mendez Date of Service: 12/05/2017 8:15 AM Medical Record Number: [id number] Patient Account Number: [id number] Date of Birth/Sex: [birth year] (83 y.o. M) Treating RN: Cornell Barman Primary Care Provider: Pernell Dupre Other Clinician: Referring Provider: Pernell Dupre Treating Provider/Extender: Tito Dine in Treatment: 1 Verbal / Phone Orders: No Diagnosis Coding Wound Cleansing Wound #1 Left,Circumferential Toe Second o Clean wound with Normal Saline. Wound #2 Right,Dorsal Hand - Palm o Clean wound with Normal Saline. Anesthetic (add to Medication List) Wound #1 Left,Circumferential Toe Second o Topical Lidocaine 4% cream applied to wound bed prior to debridement (In Clinic Only). Wound #2 Right,Dorsal Hand - Palm o Topical Lidocaine 4% cream applied to wound bed prior to debridement (In Clinic Only). Primary Wound Dressing Wound #1 Left,Circumferential Toe Second o Iodoflex Wound #2 Right,Dorsal Hand - Palm o Iodoflex Secondary Dressing Wound #1 Left,Circumferential Toe Second o Conform/Kerlix - Lightly on toe. Wound #2 Right,Dorsal Hand - Palm o Conform/Kerlix - Lightly on toe. Dressing Change Frequency Wound #1 Left,Circumferential Toe Second o Change Dressing Monday,  Wednesday, Friday Wound #2 Right,Dorsal Hand - Palm o Change Dressing Monday, Wednesday, Friday Follow-up Appointments Wound #1 Left,Circumferential Toe Second o Return Appointment in 1 week. Wound #2 Right,Dorsal Hand - Palm o Return Appointment in 1 week. Additional Orders / Instructions RANEN, DOOLIN. ([id number]) Wound #1 Left,Circumferential Toe Second o Other: - Watch redness on toe. If it spreads, go straight to the Emergency Department for assessment. Wound #2 Right,Dorsal Hand - Palm o Other: - Watch redness on toe. If it spreads, go straight to the Emergency Department for assessment. Home Health Wound #2 [place] Visits - [facility] [place] Nurse may visit PRN to address patientos wound care needs. o FACE TO FACE ENCOUNTER: MEDICARE and MEDICAID PATIENTS: I certify that this patient is under my care and that I had a face-to-face encounter that meets the physician face-to-face encounter requirements with this patient on this date. The encounter with the patient was in whole or in part for the following MEDICAL CONDITION: (primary reason for [place]) MEDICAL NECESSITY: I certify, that based on my findings, NURSING services are a medically necessary home health service. HOME BOUND STATUS: I certify that my clinical findings support that this patient is homebound (i.e., Due to illness or injury, pt requires aid of supportive devices such as crutches, cane, wheelchairs, walkers, the use of special transportation or the assistance of another person to leave their place of residence. There is a normal inability to leave the home and doing so requires considerable and taxing effort. Other absences are for medical reasons / religious services and are infrequent or of short duration when for other reasons). o If current dressing causes regression in wound condition, may D/C ordered dressing product/s and  apply Normal Saline Moist Dressing daily until next [place] / Other MD appointment. [place] of regression in wound condition at [phone]. o Please direct any NON-WOUND related issues/requests for orders to patient's Primary Care Physician Medications-please add to medication list. Wound #1 Left,Circumferential Toe Second o P.O. Antibiotics - Complete Antibiotics Electronic Signature(s) Signed: 12/05/2017 5:32:05 PM By: Linton Ham MD Signed: 12/06/2017 7:26:08 AM By: Gretta Cool, BSN, RN, CWS, Kim  RN, BSN Entered By: Gretta Cool, BSN, RN, CWS, Kim on 12/05/2017 08:53:46 Chase Mendez, Chase Mendez ([id number]) -------------------------------------------------------------------------------- Problem List Details Patient Name: Chase Mendez Date of Service: 12/05/2017 8:15 AM Medical Record Number: [id number] Patient Account Number: [id number] Date of Birth/Sex: Feb 17, 1934 (83 y.o. 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 M) Treating RN: Cornell Barman Primary Care Provider: Pernell Dupre Other Clinician: Referring Provider: Pernell Dupre Treating Provider/Extender: Tito Dine in Treatment: 1 Subjective History of Present Illness (HPI) ADMISSION 11/28/17 This is an 83 year old man who had an initial fall on October 3. Apparently he suffered a laceration of the left second toe that required 5 sutures. The sutures were removed in mid October. The patient traumatized the toe again 4 days ago while walking on a carpet. He apparently has left foot drop and he catches the forefoot when he walks. This reopened the toe. He was seen in the ER yesterday. An x-ray done of the toes suggested a fracture at the lateral base of the distal phalanx that was apparently well approximated. He was also noted to have an infection in the toe/cellulitis and was started on clindamycin. With regards to his right hand he had another fall on October 9. He suffered a laceration with a flap dehiscence over the Hypo-thenar eminence just distal to the wrist. He did not get this sutured however sometime after this he saw his primary care physician who tried to use Dermabond on this but it did not adhere. He is a diabetic but is on diet alone. His metformin was stopped when his blood sugars were in the normal range and his hemoglobin A1c was in the mid fives. He has foot drop secondary to apparently back issues. He has had a prior history of L5 radiculopathy. Beside this he has chronic gastritis hypothyroidism gastroesophageal reflux disease ABIs in our clinic were 1.13 bilaterally 12/05/17; follow-up for a patient that we admitted last week who had 2 fall and laceration injuries 1 on the hypo-thenar eminence of the right hand and the second on the left second toe. The left second toe required sutures which healed but then he retraumatized it.[name]  He also had been started on clindamycin in the emergency room when we saw him last week for cellulitis of the second toe that extended into the proximal dorsal foot. I don't believe there was an actual culture. We used Iodoflex last week The area on the hypo-thenar eminence of the right hand is an interesting flap of tissue that failed Dermabond adhesion in his primary care office. Normally flaps of skin and subcutaneous tissue like this become ischemic however for some reason this is actually remaining viable. We put Iodoflex under it last week And compression to hopefully  form adhesion. I had some thoughts that the flap of tissue would have to be removed and the wound would have to be healed from the bottom this in terms of promoting granulation Objective Constitutional Patient is hypertensive.. Pulse regular and within target range for patient.[name] Respirations regular, non-labored and within target range.. Temperature is normal and within the target range for the patient.[name] appears in no distress. Vitals Time Taken: 8:10 AM, Height: 72 in, Weight: 192.7 lbs, BMI: 26.1, Temperature: 98.0 F, Pulse: 55 bpm, Respiratory Rate: 16 breaths/min, Blood Pressure: 146/64 mmHg. Chase Mendez, Chase Mendez ([id number]) General Notes: Wound exam #1 right hyperthenar eminence. Miraculously this flap of tissue as adhered to the base of the wound however is also extending above the tissue plane of the skin around it. This continues to be a somewhat odd occurrence nevertheless the tissue itself looks completely viable there is no evidence of ischemia of the flap and at this point since the patient is not concerned about the cosmetics of what this looks like it would be difficult to recommend removing it. Using a #3 curet I removed callus and nonviable skin from the circumference of this. It is only open superiorly with the rest of this area adhering nicely. He is going to leave him almost with a raised edge of tissue around  the surrounding skin however he doesn't seem concerned about that #2 left second toe. Much less swollen and much less erythematous. The area I marked at the base of the toe dorsally as receded nicely. He still has a linear slitlike wound over the DIP. Using a #5 curet I remove necrotic debris from the wound base. This really looks quite healthy and is titrating up nicely. Integumentary (Hair, Skin) Wound #1 status is Open. Original cause of wound was Trauma. The wound is located on the Left,Circumferential Toe Second. The wound measures 0.2cm length x 2cm width x 0.1cm depth; 0.314cm^2 area and 0.031cm^3 volume. Wound #2 status is Open. Original cause of wound was Trauma. The wound is located on the Right,Dorsal Hand - Palm. The wound measures 1cm length x 2cm width x 0.2cm depth; 1.571cm^2 area and 0.314cm^3 volume. Assessment Active Problems ICD-10 Unspecified open wound of right hand, subsequent encounter Type 2 diabetes mellitus with foot ulcer Non-pressure chronic ulcer of other part of left foot with necrosis of bone Cellulitis of left lower limb Procedures Wound #1 Pre-procedure diagnosis of Wound #1 is a Trauma, Other located on the Left,Circumferential Toe Second . There was a Excisional Skin/Subcutaneous Tissue Debridement with a total area of 0.4 sq cm performed by Ricard Dillon, MD. With the following instrument(s): Curette to remove Viable tissue/material. Material removed includes Subcutaneous Tissue and Slough and after achieving pain control using Lidocaine. No specimens were taken. A time out was conducted at 08:46, prior to the start of the procedure. A Minimum amount of bleeding was controlled with Pressure. The procedure was tolerated well with a pain level of 1 throughout. Post Debridement Measurements: 0.2cm length x 2cm width x 0.2cm depth; 0.063cm^3 volume. Character of Wound/Ulcer Post Debridement is stable. Post procedure Diagnosis Wound #1: Same as  Pre-Procedure Wound #2 Pre-procedure diagnosis of Wound #2 is a Trauma, Other located on the Right,Dorsal Hand - Palm . There was a Selective/Open Wound Skin/Dermis Debridement with a total area of 2 sq cm performed by Ricard Dillon, MD. With the following instrument(s): Curette to remove Non-Viable tissue/material. Material removed includes Callus and Skin: Dermis and after achieving pain control using Lidocaine.  No specimens were taken. A time out was conducted at 08:46, prior to the start of the procedure. A Minimum amount of bleeding was controlled with Pressure. The procedure was tolerated well with a Chase Mendez, Chase Mendez. ([id number]) pain level of 1 throughout. Post Debridement Measurements: 1cm length x 2cm width x 0.1cm depth; 0.157cm^3 volume. Character of Wound/Ulcer Post Debridement is stable. Post procedure Diagnosis Wound #2: Same as Pre-Procedure Plan Wound Cleansing: Wound #1 Left,Circumferential Toe Second: Clean wound with Normal Saline. Wound #2 Right,Dorsal Hand - Palm: Clean wound with Normal Saline. Anesthetic (add to Medication List): Wound #1 Left,Circumferential Toe Second: Topical Lidocaine 4% cream applied to wound bed prior to debridement (In Clinic Only). Wound #2 Right,Dorsal Hand - Palm: Topical Lidocaine 4% cream applied to wound bed prior to debridement (In Clinic Only). Primary Wound Dressing: Wound #1 Left,Circumferential Toe Second: Iodoflex Wound #2 Right,Dorsal Hand - Palm: Iodoflex Secondary Dressing: Wound #1 Left,Circumferential Toe Second: Conform/Kerlix - Lightly on toe. Wound #2 Right,Dorsal Hand - Palm: Conform/Kerlix - Lightly on toe. Dressing Change Frequency: Wound #1 Left,Circumferential Toe Second: Change Dressing Monday, Wednesday, Friday Wound #2 Right,Dorsal Hand - Palm: Change Dressing Monday, Wednesday, Friday Follow-up Appointments: Wound #1 Left,Circumferential Toe Second: Return Appointment in 1 week. Wound #2  Right,Dorsal Hand - Palm: Return Appointment in 1 week. Additional Orders / Instructions: Wound #1 Left,Circumferential Toe Second: Other: - Watch redness on toe. If it spreads, go straight to the Emergency Department for assessment. Wound #2 Right,Dorsal Hand - Palm: Other: - Watch redness on toe. If it spreads, go straight to the Emergency Department for assessment. Home Health: Wound #2 Right,Dorsal Hand - Palm: [place] Visits - [facility] [place] Nurse may visit PRN to address patient s wound care needs. FACE TO FACE ENCOUNTER: MEDICARE and MEDICAID PATIENTS: I certify that this patient is under my care and that I had a face-to-face encounter that meets the physician face-to-face encounter requirements with this patient on this date. The encounter with the patient was in whole or in part for the following MEDICAL CONDITION: (primary reason for [place]) MEDICAL NECESSITY: I certify, that based on my findings, NURSING services are a medically necessary home health service. HOME BOUND STATUS: I certify that my clinical findings support that this patient is homebound (i.e., Due to illness or injury, pt requires aid of supportive devices such as crutches, cane, wheelchairs, walkers, the use of special transportation or the assistance of another person to leave their place of residence. There is a normal inability to leave the home and doing so requires considerable and taxing effort. Other absences are for medical reasons / religious services and are infrequent or of short duration when for other reasons). Chase Mendez, Chase Mendez. ([id number]) If current dressing causes regression in wound condition, may D/C ordered dressing product/s and apply Normal Saline Moist Dressing daily until next [place] / Other MD appointment. [place] of regression in wound condition at [phone]. Please direct any NON-WOUND related issues/requests for orders to  patient's Primary Care Physician Medications-please add to medication list.: Wound #1 Left,Circumferential Toe Second: P.O. Antibiotics - Complete Antibiotics #1 surgical debridement of the left second toe and selective debridement of the tissue around the right hand #2 the area on the hand is only open on the superior aspect. I think we can still T's Iodosorb ointment and that this area which might promote more adhesion. #3 left second toe looks a lot better he can complete his antibiotics.  We will continue to use Iodoflex may be able to change to an alternative dressing next week. Electronic Signature(s) Signed: 12/05/2017 5:32:05 PM By: Linton Ham MD Entered By: Linton Ham on 12/05/2017 09:09:53 Chase Mendez, Chase Mendez ([id number]) -------------------------------------------------------------------------------- [place] Details Patient Name: Chase Mendez Date of Service: 12/05/2017 Medical Record Number: [id number] Patient Account Number: [id number] Date of Birth/Sex: Mar 29, 1934 (83 y.o. M) Treating RN: Cornell Barman Primary Care Provider: Pernell Dupre Other Clinician: Referring Provider: Pernell Dupre Treating Provider/Extender: Tito Dine in Treatment: 1 Diagnosis Coding ICD-10 Codes Code Description S61.401D Unspecified open wound of right hand, subsequent encounter E11.621 Type 2 diabetes mellitus with foot ulcer L97.524 Non-pressure chronic ulcer of other part of left foot with necrosis of bone L03.116 Cellulitis of left lower limb Facility Procedures CPT4 Code: [id number] Description: 11886 - DEB SUBQ TISSUE 20 SQ CM/< ICD-10 Diagnosis Description L97.524 Non-pressure chronic ulcer of other part of left foot with necr Modifier: osis of bone Quantity: 1 CPT4 Code: [id number] Description: 15947 - DEBRIDE WOUND 1ST 20 SQ CM OR < ICD-10 Diagnosis Description S61.401D Unspecified open wound of right hand, subsequent encounter Modifier: 59 Quantity:  1 Physician Procedures CPT4 Code: [id number] Description: 34373 - WC PHYS SUBQ TISS 20 SQ CM ICD-10 Diagnosis Description L97.524 Non-pressure chronic ulcer of other part of left foot with necr Modifier: osis of bone Quantity: 1 CPT4 Code: [id number] Description: 78412 - WC PHYS DEBR WO ANESTH 20 SQ CM ICD-10 Diagnosis Description S61.401D Unspecified open wound of right hand, subsequent encounter Modifier: 59 Quantity: 1 Electronic Signature(s) Signed: 12/05/2017 5:32:05 PM By: Linton Ham MD Entered By: Linton Ham on 12/05/2017 09:11:26

## 2017-12-12 ENCOUNTER — Encounter: Payer: Medicare Other | Attending: Internal Medicine | Admitting: Internal Medicine

## 2017-12-12 DIAGNOSIS — S61401A Unspecified open wound of right hand, initial encounter: Secondary | ICD-10-CM | POA: Diagnosis not present

## 2017-12-12 DIAGNOSIS — E11621 Type 2 diabetes mellitus with foot ulcer: Secondary | ICD-10-CM | POA: Diagnosis present

## 2017-12-12 DIAGNOSIS — L03116 Cellulitis of left lower limb: Secondary | ICD-10-CM | POA: Insufficient documentation

## 2017-12-12 DIAGNOSIS — X58XXXA Exposure to other specified factors, initial encounter: Secondary | ICD-10-CM | POA: Diagnosis not present

## 2017-12-12 DIAGNOSIS — L97524 Non-pressure chronic ulcer of other part of left foot with necrosis of bone: Secondary | ICD-10-CM | POA: Diagnosis not present

## 2017-12-15 NOTE — Progress Notes (Signed)
Chase Mendez, Chase Mendez ([id number]) Visit Report for 12/12/2017 Debridement Details Patient Name: Chase Mendez, Chase Mendez Date of Service: 12/12/2017 8:15 AM Medical Record Number: [id number] Patient Account Number: [id number] Date of Birth/Sex: 06/08/1934 (83 y.o. Male) Treating RN: Cornell Barman Primary Care Provider: Pernell Dupre Other Clinician: Referring Provider: Pernell Dupre Treating Provider/Extender: Tito Dine in Treatment: 2 Debridement Performed for Wound #1 Left,Circumferential Toe Second Assessment: Performed By: Physician Ricard Dillon, MD Debridement Type: Debridement Level of Consciousness (Pre- Awake and Alert procedure): Pre-procedure Verification/Time Yes - 08:28 Out Taken: Start Time: 08:28 Pain Control: Lidocaine Total Area Debrided (L x W): 0.2 (cm) x 2.2 (cm) = 0.44 (cm) Tissue and other material Non-Viable, Eschar, Skin: Dermis debrided: Level: Skin/Dermis Debridement Description: Selective/Open Wound Instrument: Curette Bleeding: None End Time: 08:32 Response to Treatment: Procedure was tolerated well Level of Consciousness Awake and Alert (Post-procedure): Post Debridement Measurements of Total Wound Length: (cm) 0.2 Width: (cm) 2.2 Depth: (cm) 0.1 Volume: (cm) 0.035 Character of Wound/Ulcer Post Debridement: Requires Further Debridement Post Procedure Diagnosis Same as Pre-procedure Electronic Signature(s) Signed: 12/12/2017 4:33:49 PM By: Linton Ham MD Signed: 12/13/2017 10:02:59 AM By: Gretta Cool, BSN, RN, CWS, Kim RN, BSN Entered By: Linton Ham on 12/12/2017 08:41:41 Chase Mendez, Chase Mendez ([id number]) -------------------------------------------------------------------------------- HPI Details Patient Name: Chase Mendez Date of Service: 12/12/2017 8:15 AM Medical Record Number: [id number] Patient Account Number: [id number] Date of Birth/Sex: Feb 03, 1935 (83 y.o. Male) Treating RN: Cornell Barman Primary Care Provider:  Pernell Dupre Other Clinician: Referring Provider: Pernell Dupre Treating Provider/Extender: Ricard Dillon Weeks in Treatment: 2 History of Present Illness HPI Description: ADMISSION 11/28/17 This is an 83 year old man who had an initial fall on October 3. Apparently he suffered a laceration of the left second toe that required 5 sutures. The sutures were removed in mid October. The patient traumatized the toe again 4 days ago while walking on a carpet. He apparently has left foot drop and he catches the forefoot when he walks. This reopened the toe. He was seen in the ER yesterday. An x-ray done of the toes suggested a fracture at the lateral base of the distal phalanx that was apparently well approximated. He was also noted to have an infection in the toe/cellulitis and was started on clindamycin. With regards to his right hand he had another fall on October 9. He suffered a laceration with a flap dehiscence over the Hypo-thenar eminence just distal to the wrist. He did not get this sutured however sometime after this he saw his primary care physician who tried to use Dermabond on this but it did not adhere. He is a diabetic but is on diet alone. His metformin was stopped when his blood sugars were in the normal range and his hemoglobin A1c was in the mid fives. He has foot drop secondary to apparently back issues. He has had a prior history of L5 radiculopathy. Beside this he has chronic gastritis hypothyroidism gastroesophageal reflux disease ABIs in our clinic were 1.13 bilaterally 12/05/17; follow-up for a patient that we admitted last week who had 2 fall and laceration injuries 1 on the hypo-thenar eminence of the right hand and the second on the left second toe. The left second toe required sutures which healed but then he retraumatized it.[name] He also had been started on clindamycin in the emergency room when we saw him last week for cellulitis of the second toe that extended  into the proximal dorsal foot. I don't believe there was an actual culture. We used  Iodoflex last week The area on the hypo-thenar eminence of the right hand is an interesting flap of tissue that failed Dermabond adhesion in his primary care office. Normally flaps of skin and subcutaneous tissue like this become ischemic however for some reason this is actually remaining viable. We put Iodoflex under it last week And compression to hopefully form adhesion. I had some thoughts that the flap of tissue would have to be removed and the wound would have to be healed from the bottom this in terms of promoting granulation 12/12/17; the area on the left second toe required debridement today however this looks a lot better. We've been using Iodoflex. The area on the hypo-thenar eminence is actually at curing we've been using Iodosorb. The patient had a epidermoid cysto Removed from the right lateral part of his face just below his ear. He is on doxycycline for this I did not look at this. Electronic Signature(s) Signed: 12/12/2017 4:33:49 PM By: Linton Ham MD Entered By: Linton Ham on 12/12/2017 08:43:14 Chase Mendez ([id number]) -------------------------------------------------------------------------------- Physical Exam Details Patient Name: Chase Mendez Date of Service: 12/12/2017 8:15 AM Medical Record Number: [id number] Patient Account Number: [id number] Date of Birth/Sex: Apr 24, 1934 (83 y.o. Male) Treating RN: Cornell Barman Primary Care Provider: Pernell Dupre Other Clinician: Referring Provider: Pernell Dupre Treating Provider/Extender: Tito Dine in Treatment: 2 Constitutional Patient is hypertensive.. Pulse regular and within target range for patient.[name] Respirations regular, non-labored and within target range.. Temperature is normal and within the target range for the patient.[name] appears in no distress. Notes Wound exam; #1 right hypo-thenar eminence. The  flap of skin is still intact it is not totally adherent superiorly and we've been using a resort to this area. There is no question of my mind that this is becoming more adherent each week we see this #2 left second toe the toe is less swollen and less erythematous. He has completed our antibiotics. The wound was a linear slitlike area over the DIP this is now fully closed which is a good observation. Necrotic debris dried skin removed from the surface of the wound with a #3 curet. There was no bleeding Electronic Signature(s) Signed: 12/12/2017 4:33:49 PM By: Linton Ham MD Entered By: Linton Ham on 12/12/2017 08:45:06 Chase Mendez ([id number]) -------------------------------------------------------------------------------- Physician Orders Details Patient Name: Chase Mendez Date of Service: 12/12/2017 8:15 AM Medical Record Number: [id number] Patient Account Number: [id number] Date of Birth/Sex: Apr 16, 1934 (83 y.o. Male) Treating RN: Cornell Barman Primary Care Provider: Pernell Dupre Other Clinician: Referring Provider: Pernell Dupre Treating Provider/Extender: Tito Dine in Treatment: 2 Verbal / Phone Orders: No Diagnosis Coding Wound Cleansing Wound #1 Left,Circumferential Toe Second o Clean wound with Normal Saline. Wound #2 Right,Dorsal Hand - Palm o Clean wound with Normal Saline. Anesthetic (add to Medication List) Wound #1 Left,Circumferential Toe Second o Topical Lidocaine 4% cream applied to wound bed prior to debridement (In Clinic Only). Wound #2 Right,Dorsal Hand - Palm o Topical Lidocaine 4% cream applied to wound bed prior to debridement (In Clinic Only). Primary Wound Dressing Wound #1 Left,Circumferential Toe Second o Silver Alginate Wound #2 Right,Dorsal Hand - Palm o Iodoflex Secondary Dressing Wound #1 Left,Circumferential Toe Second o Conform/Kerlix - Lightly on toe. Wound #2 Right,Dorsal Hand - Palm o  Conform/Kerlix Dressing Change Frequency Wound #1 Left,Circumferential Toe Second o Change Dressing Monday, Wednesday, Friday Wound #2 Right,Dorsal Hand - Palm o Change Dressing Monday, Wednesday, Friday Follow-up Appointments Wound #1 Left,Circumferential Toe Second o Return  Appointment in 1 week. Wound #2 Right,Dorsal Hand - Palm o Return Appointment in 1 week. Additional Orders / Instructions Chase Mendez, TIGGS. ([id number]) Wound #1 Left,Circumferential Toe Second o Other: - Watch redness on toe. If it spreads, go straight to the Emergency Department for assessment. Home Health Wound #2 [place] Visits - [facility] [place] Nurse may visit PRN to address patientos wound care needs. o FACE TO FACE ENCOUNTER: MEDICARE and MEDICAID PATIENTS: I certify that this patient is under my care and that I had a face-to-face encounter that meets the physician face-to-face encounter requirements with this patient on this date. The encounter with the patient was in whole or in part for the following MEDICAL CONDITION: (primary reason for [place]) MEDICAL NECESSITY: I certify, that based on my findings, NURSING services are a medically necessary home health service. HOME BOUND STATUS: I certify that my clinical findings support that this patient is homebound (i.e., Due to illness or injury, pt requires aid of supportive devices such as crutches, cane, wheelchairs, walkers, the use of special transportation or the assistance of another person to leave their place of residence. There is a normal inability to leave the home and doing so requires considerable and taxing effort. Other absences are for medical reasons / religious services and are infrequent or of short duration when for other reasons). o If current dressing causes regression in wound condition, may D/C ordered dressing product/s and apply Normal Saline Moist Dressing  daily until next [place] / Other MD appointment. [place] of regression in wound condition at [phone]. o Please direct any NON-WOUND related issues/requests for orders to patient's Primary Care Physician Electronic Signature(s) Signed: 12/12/2017 4:33:49 PM By: Linton Ham MD Signed: 12/13/2017 10:02:59 AM By: Gretta Cool, BSN, RN, CWS, Kim RN, BSN Entered By: Gretta Cool, BSN, RN, CWS, Kim on 12/12/2017 08:33:06 Chase Mendez, Chase Mendez ([id number]) -------------------------------------------------------------------------------- Problem List Details Patient Name: Chase Mendez Date of Service: 12/12/2017 8:15 AM Medical Record Number: [id number] Patient Account Number: [id number] Date of Birth/Sex: Aug 25, 1934 (83 y.o. 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 Male) Treating RN: Cornell Barman Primary Care Provider: Pernell Dupre Other Clinician: Referring  Provider: Pernell Dupre Treating Provider/Extender: Ricard Dillon Weeks in Treatment: 2 Subjective History of Present Illness (HPI) ADMISSION 11/28/17 This is an 83 year old man who had an initial fall on October 3. Apparently he suffered a laceration of the left second toe that required 5 sutures. The sutures were removed in mid October. The patient traumatized the toe again 4 days ago while walking on a carpet. He apparently has left foot drop and he catches the forefoot when he walks. This reopened the toe.  He was seen in the ER yesterday. An x-ray done of the toes suggested a fracture at the lateral base of the distal phalanx that was apparently well approximated. He was also noted to have an infection in the toe/cellulitis and was started on clindamycin. With regards to his right hand he had another fall on October 9. He suffered a laceration with a flap dehiscence over the Hypo-thenar eminence just distal to the wrist. He did not get this sutured however sometime after this he saw his primary care physician who tried to use Dermabond on this but it did not adhere. He is a diabetic but is on diet alone. His metformin was stopped when his blood sugars were in the normal range and his hemoglobin A1c was in the mid fives. He has foot drop secondary to apparently back issues. He has had a prior history of L5 radiculopathy. Beside this he has chronic gastritis hypothyroidism gastroesophageal reflux disease ABIs in our clinic were 1.13 bilaterally 12/05/17; follow-up for a patient that we admitted last week who had 2 fall and laceration injuries 1 on the hypo-thenar eminence of the right hand and the second on the left second toe. The left second toe required sutures which healed but then he retraumatized it.[name] He also had been started on clindamycin in the emergency room when we saw him last week for cellulitis of the second toe that extended into the proximal dorsal foot. I don't  believe there was an actual culture. We used Iodoflex last week The area on the hypo-thenar eminence of the right hand is an interesting flap of tissue that failed Dermabond adhesion in his primary care office. Normally flaps of skin and subcutaneous tissue like this become ischemic however for some reason this is actually remaining viable. We put Iodoflex under it last week And compression to hopefully form adhesion. I had some thoughts that the flap of tissue would have to be removed and the wound would have to be healed from the bottom this in terms of promoting granulation 12/12/17; the area on the left second toe required debridement today however this looks a lot better. We've been using Iodoflex. The area on the hypo-thenar eminence is actually at curing we've been using Iodosorb. The patient had a epidermoid cysto Removed from the right lateral part of his face just below his ear. He is on doxycycline for this I did not look at this. Objective Constitutional Chase Mendez, Chase Mendez ([id number]) Patient is hypertensive.. Pulse regular and within target range for patient.[name] Respirations regular, non-labored and within target range.. Temperature is normal and within the target range for the patient.[name] appears in no distress. Vitals Time Taken: 8:07 AM, Height: 72 in, Weight: 192.7 lbs, BMI: 26.1, Temperature: 98.3 F, Pulse: 55 bpm, Respiratory Rate: 16 breaths/min, Blood Pressure: 162/65 mmHg. General Notes: Wound exam; #1 right hypo-thenar eminence. The flap of skin is still intact it is not totally adherent superiorly and we've been using a resort to this area. There is no question of my mind that this is becoming more adherent each week we see this #2 left second toe the toe is less swollen and less erythematous. He has completed our antibiotics. The wound was a linear slitlike area over the DIP this is now fully closed which is a good observation. Necrotic debris dried skin removed from the  surface of the wound with a #3 curet. There was no bleeding Integumentary (Hair, Skin) Wound #1 status is Open. Original cause of wound was Trauma.  The wound is located on the Left,Circumferential Toe Second. The wound measures 0.2cm length x 2.2cm width x 0.1cm depth; 0.346cm^2 area and 0.035cm^3 volume. There is Fat Layer (Subcutaneous Tissue) Exposed exposed. There is no tunneling or undermining noted. There is a medium amount of serous drainage noted. The wound margin is flat and intact. There is no granulation within the wound bed. There is a large (67-100%) amount of necrotic tissue within the wound bed including Eschar. The periwound skin appearance did not exhibit: Callus, Crepitus, Excoriation, Induration, Rash, Scarring, Dry/Scaly, Maceration, Atrophie Blanche, Cyanosis, Ecchymosis, Hemosiderin Staining, Mottled, Pallor, Rubor, Erythema. Wound #2 status is Open. Original cause of wound was Trauma. The wound is located on the Right,Dorsal Hand - Palm. The wound measures 1.2cm length x 0.5cm width x 0.1cm depth; 0.471cm^2 area and 0.047cm^3 volume. There is Fat Layer (Subcutaneous Tissue) Exposed exposed. There is no tunneling noted, however, there is undermining starting at 5:00 and ending at 7:00 with a maximum distance of 0.4cm. There is a medium amount of serous drainage noted. The wound margin is flat and intact. There is medium (34-66%) pink granulation within the wound bed. There is a medium (34-66%) amount of necrotic tissue within the wound bed including Adherent Slough. The periwound skin appearance did not exhibit: Callus, Crepitus, Excoriation, Induration, Rash, Scarring, Dry/Scaly, Maceration, Atrophie Blanche, Cyanosis, Ecchymosis, Hemosiderin Staining, Mottled, Pallor, Rubor, Erythema. The periwound has tenderness on palpation. Assessment Active Problems ICD-10 Unspecified open wound of right hand, subsequent encounter Type 2 diabetes mellitus with foot ulcer Non-pressure  chronic ulcer of other part of left foot with necrosis of bone Cellulitis of left lower limb Procedures Wound #1 Pre-procedure diagnosis of Wound #1 is a Trauma, Other located on the Left,Circumferential Toe Second . There was a Selective/Open Wound Skin/Dermis Debridement with a total area of 0.44 sq cm performed by Ricard Dillon, MD. With the following instrument(s): Curette to remove Non-Viable tissue/material. Material removed includes Eschar and Skin: Dermis and after achieving pain control using Lidocaine. No specimens were taken. A time out was conducted at 08:28, prior to the start of the procedure. There was no bleeding. The procedure was tolerated well. Post Debridement Measurements: Chase Mendez, ADORNO. ([id number]) 0.2cm length x 2.2cm width x 0.1cm depth; 0.035cm^3 volume. Character of Wound/Ulcer Post Debridement requires further debridement. Post procedure Diagnosis Wound #1: Same as Pre-Procedure Plan Wound Cleansing: Wound #1 Left,Circumferential Toe Second: Clean wound with Normal Saline. Wound #2 Right,Dorsal Hand - Palm: Clean wound with Normal Saline. Anesthetic (add to Medication List): Wound #1 Left,Circumferential Toe Second: Topical Lidocaine 4% cream applied to wound bed prior to debridement (In Clinic Only). Wound #2 Right,Dorsal Hand - Palm: Topical Lidocaine 4% cream applied to wound bed prior to debridement (In Clinic Only). Primary Wound Dressing: Wound #1 Left,Circumferential Toe Second: Silver Alginate Wound #2 Right,Dorsal Hand - Palm: Iodoflex Secondary Dressing: Wound #1 Left,Circumferential Toe Second: Conform/Kerlix - Lightly on toe. Wound #2 Right,Dorsal Hand - Palm: Conform/Kerlix Dressing Change Frequency: Wound #1 Left,Circumferential Toe Second: Change Dressing Monday, Wednesday, Friday Wound #2 Right,Dorsal Hand - Palm: Change Dressing Monday, Wednesday, Friday Follow-up Appointments: Wound #1 Left,Circumferential Toe Second: Return  Appointment in 1 week. Wound #2 Right,Dorsal Hand - Palm: Return Appointment in 1 week. Additional Orders / Instructions: Wound #1 Left,Circumferential Toe Second: Other: - Watch redness on toe. If it spreads, go straight to the Emergency Department for assessment. Home Health: Wound #2 Right,Dorsal Hand - Palm: [place] Visits - [place]  Nurse may visit PRN to address patient s wound care needs. FACE TO FACE ENCOUNTER: MEDICARE and MEDICAID PATIENTS: I certify that this patient is under my care and that I had a face-to-face encounter that meets the physician face-to-face encounter requirements with this patient on this date. The encounter with the patient was in whole or in part for the following MEDICAL CONDITION: (primary reason for [place]) MEDICAL NECESSITY: I certify, that based on my findings, NURSING services are a medically necessary home health service. HOME BOUND STATUS: I certify that my clinical findings support that this patient is homebound (i.e., Due to illness or injury, pt requires aid of supportive devices such as crutches, cane, wheelchairs, walkers, the use of special transportation or the assistance of another person to leave their place of residence. There is a normal inability to leave the home and doing so requires considerable and taxing effort. Other absences are for medical reasons / religious services and are infrequent or of short duration when for other reasons). If current dressing causes regression in wound condition, may D/C ordered dressing product/s and apply Normal Saline Moist Dressing daily until next [place] / Other MD appointment. [place] of regression in [place] ([id number]) wound condition at [phone]. Please direct any NON-WOUND related issues/requests for orders to patient's Primary Care Physician #1 I'm going to continue with the Iodosorb ointment to the hypo-thenar  eminence on the right hand this looks like it's becoming more adherent #2 the left second toe looks a lot better this is closed down still not totally epithelialized but is only a surface injury now. New dressing is silver alginate #3 the infection that was present in the left second toe extending into the dorsal aspect is resolved. He has now off the antibiotics we gave him however he is on doxycycline for a cyst removed on his right face just below the year. This was apparently done by ENT. I did not look at this today Electronic Signature(s) Signed: 12/12/2017 4:33:49 PM By: Linton Ham MD Entered By: Linton Ham on 12/12/2017 08:46:21 Chase Mendez, Chase Mendez ([id number]) -------------------------------------------------------------------------------- [place] Details Patient Name: Chase Mendez Date of Service: 12/12/2017 Medical Record Number: [id number] Patient Account Number: [id number] Date of Birth/Sex: [birth year] (83 y.o. Male) Treating RN: Cornell Barman Primary Care Provider: Pernell Dupre Other Clinician: Referring Provider: Pernell Dupre Treating Provider/Extender: Tito Dine in Treatment: 2 Diagnosis Coding ICD-10 Codes Code Description S61.401D Unspecified open wound of right hand, subsequent encounter E11.621 Type 2 diabetes mellitus with foot ulcer L97.524 Non-pressure chronic ulcer of other part of left foot with necrosis of bone L03.116 Cellulitis of left lower limb Facility Procedures CPT4 Code: [id number] Description: [id number] - DEBRIDE WOUND 1ST 20 SQ CM OR < ICD-10 Diagnosis Description L97.524 Non-pressure chronic ulcer of other part of left foot with necr Modifier: osis of bone Quantity: 1 Physician Procedures CPT4 Code: [id number] Description: 27517 - WC PHYS DEBR WO ANESTH 20 SQ CM ICD-10 Diagnosis Description L97.524 Non-pressure chronic ulcer of other part of left foot with necr Modifier: osis of bone Quantity: 1 Electronic  Signature(s) Signed: 12/12/2017 4:33:49 PM By: Linton Ham MD Entered By: Linton Ham on 12/12/2017 08:46:50

## 2017-12-15 NOTE — Progress Notes (Signed)
Chase Mendez ([id number]) Visit Report for 12/12/2017 Arrival Information Details Patient Name: Chase Mendez, Chase Mendez Date of Service: 12/12/2017 8:15 AM Medical Record Number: [id number] Patient Account Number: [id number] Date of Birth/Sex: 1935/01/03 (83 y.o. Male) Treating RN: Cornell Barman Primary Care Joanie Duprey: Pernell Dupre Other Clinician: Referring Duong Haydel: Pernell Dupre Treating Arval Brandstetter/Extender: Tito Dine in Treatment: 2 Visit Information History Since Last Visit Added or deleted any medications: Yes Patient Arrived: Wheel Chair Any new allergies or adverse reactions: No Arrival Time: 08:07 Had a fall or experienced change in No Accompanied By: wife activities of daily living that may affect Transfer Assistance: None risk of falls: Patient Identification Verified: Yes Signs or symptoms of abuse/neglect since last visito No Secondary Verification Process Completed: Yes Hospitalized since last visit: No Implantable device outside of the clinic excluding No cellular tissue based products placed in the center since last visit: Has Dressing in Place as Prescribed: Yes Pain Present Now: No Electronic Signature(s) Signed: 12/12/2017 12:16:48 PM By: Lorine Bears RCP, RRT, CHT Entered By: Becky Sax, Amado Nash on 12/12/2017 08:08:55 Chase Mendez ([id number]) -------------------------------------------------------------------------------- Encounter Discharge Information Details Patient Name: Chase Mendez Date of Service: 12/12/2017 8:15 AM Medical Record Number: [id number] Patient Account Number: [id number] Date of Birth/Sex: 1934-05-06 (83 y.o. Male) Treating RN: Cornell Barman Primary Care Weiland Tomich: Pernell Dupre Other Clinician: Referring Cheryel Kyte: Pernell Dupre Treating Anab Vivar/Extender: Tito Dine in Treatment: 2 Encounter Discharge Information Items Post Procedure Vitals Discharge Condition:  Stable Temperature (F): 98.3 Ambulatory Status: Walker Pulse (bpm): 55 Discharge Destination: Home Respiratory Rate (breaths/min): 16 Transportation: Private Auto Blood Pressure (mmHg): 162/65 Accompanied By: spouse Schedule Follow-up Appointment: Yes Clinical Summary of Care: Electronic Signature(s) Signed: 12/13/2017 10:02:59 AM By: Gretta Cool, BSN, RN, CWS, Kim RN, BSN Entered By: Gretta Cool, BSN, RN, CWS, Kim on 12/12/2017 08:34:54 Chase Mendez ([id number]) -------------------------------------------------------------------------------- Lower Extremity Assessment Details Patient Name: Chase Mendez Date of Service: 12/12/2017 8:15 AM Medical Record Number: [id number] Patient Account Number: [id number] Date of Birth/Sex: 1934/10/15 (83 y.o. Male) Treating RN: Montey Hora Primary Care Monique Hefty: Pernell Dupre Other Clinician: Referring Jerime Arif: Pernell Dupre Treating Delphia Kaylor/Extender: Ricard Dillon Weeks in Treatment: 2 Vascular Assessment Pulses: Dorsalis Pedis Palpable: [Left:Yes] Posterior Tibial Extremity colors, hair growth, and conditions: Extremity Color: [Left:Hyperpigmented] Hair Growth on Extremity: [Left:No] Temperature of Extremity: [Left:Warm] Capillary Refill: [Left:< 3 seconds] Toe Nail Assessment Left: Right: Thick: Yes Discolored: Yes Deformed: Yes Improper Length and Hygiene: No Electronic Signature(s) Signed: 12/13/2017 5:01:53 PM By: Montey Hora Entered By: Montey Hora on 12/12/2017 08:17:59 Chase Mendez ([id number]) -------------------------------------------------------------------------------- Multi Wound Chart Details Patient Name: Chase Mendez Date of Service: 12/12/2017 8:15 AM Medical Record Number: [id number] Patient Account Number: [id number] Date of Birth/Sex: 11/24/1934 (83 y.o. Male) Treating RN: Cornell Barman Primary Care Jomari Bartnik: Pernell Dupre Other Clinician: Referring Markel Mergenthaler: Pernell Dupre Treating  Yarexi Pawlicki/Extender: Ricard Dillon Weeks in Treatment: 2 Vital Signs Height(in): 72 Pulse(bpm): 29 Weight(lbs): 192.7 Blood Pressure(mmHg): 162/65 Body Mass Index(BMI): 26 Temperature(F): 98.3 Respiratory Rate 16 (breaths/min): Photos: [1:No Photos] [2:No Photos] [N/A:N/A] Wound Location: [1:Left Toe Second - Circumfernential] [2:Right Hand - Palm - Dorsal] [N/A:N/A] Wounding Event: [1:Trauma] [2:Trauma] [N/A:N/A] Primary Etiology: [1:Trauma, Other] [2:Trauma, Other] [N/A:N/A] Comorbid History: [1:Chronic Obstructive Pulmonary Disease (COPD), Type II Diabetes, Neuropathy] [2:Chronic Obstructive Pulmonary Disease (COPD), Type II Diabetes, Neuropathy] [N/A:N/A] Date Acquired: [1:11/08/2017] [2:11/14/2017] [N/A:N/A] Weeks of Treatment: [1:2] [2:2] [N/A:N/A] Wound Status: [1:Open] [2:Open] [N/A:N/A] Measurements L x W x D [1:0.2x2.2x0.1] [2:1.2x0.5x0.1] [N/A:N/A] (cm) Area (cm) : [1:0.346] [  2:0.471] [N/A:N/A] Volume (cm) : [1:0.035] [2:0.047] [N/A:N/A] % Reduction in Area: [1:64.80%] [2:62.50%] [N/A:N/A] % Reduction in Volume: [1:64.30%] [2:93.80%] [N/A:N/A] Starting Position 1 [2:5] (o'clock): Ending Position 1 [2:7] (o'clock): Maximum Distance 1 (cm): [2:0.4] Undermining: [1:No] [2:Yes] [N/A:N/A] Classification: [1:Full Thickness Without Exposed Support Structures] [2:Full Thickness Without Exposed Support Structures] [N/A:N/A] Exudate Amount: [1:Medium] [2:Medium] [N/A:N/A] Exudate Type: [1:Serous] [2:Serous] [N/A:N/A] Exudate Color: [1:amber] [2:amber] [N/A:N/A] Wound Margin: [1:Flat and Intact] [2:Flat and Intact] [N/A:N/A] Granulation Amount: [1:None Present (0%)] [2:Medium (34-66%)] [N/A:N/A] Granulation Quality: [1:N/A] [2:Pink] [N/A:N/A] Necrotic Amount: [1:Large (67-100%)] [2:Medium (34-66%)] [N/A:N/A] Necrotic Tissue: [1:Eschar] [2:Adherent Slough] [N/A:N/A] Exposed Structures: [1:Fat Layer (Subcutaneous Tissue) Exposed: Yes Fascia: No Tendon: No] [2:Fat Layer  (Subcutaneous Tissue) Exposed: Yes Fascia: No Tendon: No] [N/A:N/A] Muscle: No Muscle: No Joint: No Joint: No Bone: No Bone: No Epithelialization: None None N/A Debridement: Debridement - Selective/Open N/A N/A Wound Pre-procedure 08:28 N/A N/A Verification/Time Out Taken: Pain Control: Lidocaine N/A N/A Tissue Debrided: Necrotic/Eschar N/A N/A Level: Skin/Dermis N/A N/A Debridement Area (sq cm): 0.44 N/A N/A Instrument: Curette N/A N/A Bleeding: None N/A N/A Debridement Treatment Procedure was tolerated well N/A N/A Response: Post Debridement 0.2x2.2x0.1 N/A N/A Measurements L x W x D (cm) Post Debridement Volume: 0.035 N/A N/A (cm) Periwound Skin Texture: Excoriation: No Excoriation: No N/A Induration: No Induration: No Callus: No Callus: No Crepitus: No Crepitus: No Rash: No Rash: No Scarring: No Scarring: No Periwound Skin Moisture: Maceration: No Maceration: No N/A Dry/Scaly: No Dry/Scaly: No Periwound Skin Color: Atrophie Blanche: No Atrophie Blanche: No N/A Cyanosis: No Cyanosis: No Ecchymosis: No Ecchymosis: No Erythema: No Erythema: No Hemosiderin Staining: No Hemosiderin Staining: No Mottled: No Mottled: No Pallor: No Pallor: No Rubor: No Rubor: No Tenderness on Palpation: No Yes N/A Wound Preparation: Ulcer Cleansing: Ulcer Cleansing: N/A Rinsed/Irrigated with Saline Rinsed/Irrigated with Saline Topical Anesthetic Applied: Topical Anesthetic Applied: Other: lidocaine 4% Other: lidocaine 4% Procedures Performed: Debridement N/A N/A Treatment Notes Wound #1 (Left, Circumferential Toe Second) Notes Iodosorb on hand sivercell on toe Wound #2 (Right, Dorsal Hand - Palm) Notes Iodosorb on hand sivercell on toe PINCHOS, TOPEL ([id number]) Electronic Signature(s) Signed: 12/12/2017 4:33:49 PM By: Linton Ham MD Entered By: Linton Ham on 12/12/2017 08:41:32 CHAYSON, CHARTERS  ([id number]) -------------------------------------------------------------------------------- Multi-Disciplinary Care Plan Details Patient Name: Chase Mendez Date of Service: 12/12/2017 8:15 AM Medical Record Number: [id number] Patient Account Number: [id number] Date of Birth/Sex: [birth year] (83 y.o. Male) Treating RN: Cornell Barman Primary Care Zianne Schubring: Pernell Dupre Other Clinician: Referring Navada Osterhout: Pernell Dupre Treating Trequan Marsolek/Extender: Tito Dine in Treatment: 2 Active Inactive ` Abuse / Safety / Falls / Self Care Management Nursing Diagnoses: History of Falls Impaired physical mobility Potential for falls Goals: Patient will remain injury free related to falls Date Initiated: 11/28/2017 Target Resolution Date: 12/12/2017 Goal Status: Active Patient/caregiver will verbalize understanding of skin care regimen Date Initiated: 11/28/2017 Target Resolution Date: 12/12/2017 Goal Status: Active Interventions: Assess fall risk on admission and as needed Notes: ` Medication Nursing Diagnoses: Knowledge deficit related to medication safety: actual or potential Goals: Patient/caregiver will demonstrate understanding of all current medications Date Initiated: 11/28/2017 Target Resolution Date: 12/12/2017 Goal Status: Active Patient/caregiver will demonstrate understanding of new oral/IV medications prescribed at the [facility] (topical prescriptions are covered under the skin breakdown problem) Date Initiated: 11/28/2017 Target Resolution Date: 12/12/2017 Goal Status: Active Interventions: Assess for medication contraindications each visit where new medications are prescribed Notes: ` Orientation to the [facility], [place]. ([id number]) Nursing  Diagnoses: Knowledge deficit related to the wound healing center program Goals: Patient/caregiver will verbalize understanding of the [place] Date Initiated: 11/28/2017 Target  Resolution Date: 12/12/2017 Goal Status: Active Interventions: Provide education on orientation to the wound center Notes: ` Wound/Skin Impairment Nursing Diagnoses: Impaired tissue integrity Goals: Ulcer/skin breakdown will have a volume reduction of 30% by week 4 Date Initiated: 11/28/2017 Target Resolution Date: 12/12/2017 Goal Status: Active Interventions: Assess ulceration(s) every visit Treatment Activities: Referred to DME Mayumi Summerson for dressing supplies : 11/28/2017 Skin care regimen initiated : 11/28/2017 Notes: Electronic Signature(s) Signed: 12/13/2017 10:02:59 AM By: Gretta Cool, BSN, RN, CWS, Kim RN, BSN Entered By: Gretta Cool, BSN, RN, CWS, Kim on 12/12/2017 08:27:38 Chase Mendez ([id number]) -------------------------------------------------------------------------------- Pain Assessment Details Patient Name: Chase Mendez Date of Service: 12/12/2017 8:15 AM Medical Record Number: [id number] Patient Account Number: [id number] Date of Birth/Sex: 1934-09-18 (83 y.o. Male) Treating RN: Cornell Barman Primary Care Krystofer Hevener: Pernell Dupre Other Clinician: Referring Mckyla Deckman: Pernell Dupre Treating Kennon Encinas/Extender: Ricard Dillon Weeks in Treatment: 2 Active Problems Location of Pain Severity and Description of Pain Patient Has Paino No Site Locations Pain Management and Medication Current Pain Management: Electronic Signature(s) Signed: 12/12/2017 12:16:48 PM By: Lorine Bears RCP, RRT, CHT Signed: 12/13/2017 10:02:59 AM By: Gretta Cool, BSN, RN, CWS, Kim RN, BSN Entered By: Lorine Bears on 12/12/2017 08:09:00 Chase Mendez ([id number]) -------------------------------------------------------------------------------- Patient/Caregiver Education Details Patient Name: Chase Mendez Date of Service: 12/12/2017 8:15 AM Medical Record Number: [id number] Patient Account Number: [id number] Date of Birth/Gender: July 11, 1934 (83 y.o.  Male) Treating RN: Cornell Barman Primary Care Physician: Pernell Dupre Other Clinician: Referring Physician: Pernell Dupre Treating Physician/Extender: Tito Dine in Treatment: 2 Education Assessment Education Provided To: Patient Education Topics Provided Wound/Skin Impairment: Handouts: Caring for Your Ulcer, Other: COntinue wound care as prescriebd Methods: Demonstration Responses: State content correctly Electronic Signature(s) Signed: 12/13/2017 10:02:59 AM By: Gretta Cool, BSN, RN, CWS, Kim RN, BSN Entered By: Gretta Cool, BSN, RN, CWS, Kim on 12/12/2017 08:33:50 Chase Mendez ([id number]) -------------------------------------------------------------------------------- Wound Assessment Details Patient Name: Chase Mendez Date of Service: 12/12/2017 8:15 AM Medical Record Number: [id number] Patient Account Number: [id number] Date of Birth/Sex: [birth year] (83 y.o. Male) Treating RN: Montey Hora Primary Care Montie Gelardi: Pernell Dupre Other Clinician: Referring Barney Gertsch: Pernell Dupre Treating Mainor Hellmann/Extender: Ricard Dillon Weeks in Treatment: 2 Wound Status Wound Number: 1 Primary Trauma, Other Etiology: Wound Location: Left Toe Second - Circumfernential Wound Open Wounding Event: Trauma Status: Date Acquired: 11/08/2017 Comorbid Chronic Obstructive Pulmonary Disease Weeks Of Treatment: 2 History: (COPD), Type II Diabetes, Neuropathy Clustered Wound: No Photos Photo Uploaded By: Montey Hora on 12/12/2017 10:18:18 Wound Measurements Length: (cm) 0.2 Width: (cm) 2.2 Depth: (cm) 0.1 Area: (cm) 0.346 Volume: (cm) 0.035 % Reduction in Area: 64.8% % Reduction in Volume: 64.3% Epithelialization: None Tunneling: No Undermining: No Wound Description Full Thickness Without Exposed Support Classification: Structures Wound Margin: Flat and Intact Exudate Medium Amount: Exudate Type: Serous Exudate Color: amber Foul Odor After  Cleansing: No Slough/Fibrino No Wound Bed Granulation Amount: None Present (0%) Exposed Structure Necrotic Amount: Large (67-100%) Fascia Exposed: No Necrotic Quality: Eschar Fat Layer (Subcutaneous Tissue) Exposed: Yes Tendon Exposed: No Muscle Exposed: No Joint Exposed: No Bone Exposed: No ERWIN, NISHIYAMA. ([id number]) Periwound Skin Texture Texture Color No Abnormalities Noted: No No Abnormalities Noted: No Callus: No Atrophie Blanche: No Crepitus: No Cyanosis: No Excoriation: No Ecchymosis: No Induration: No Erythema: No Rash: No Hemosiderin Staining: No Scarring: No Mottled: No Pallor:  No Moisture Rubor: No No Abnormalities Noted: No Dry / Scaly: No Maceration: No Wound Preparation Ulcer Cleansing: Rinsed/Irrigated with Saline Topical Anesthetic Applied: Other: lidocaine 4%, Treatment Notes Wound #1 (Left, Circumferential Toe Second) Notes Iodosorb on hand sivercell on toe Electronic Signature(s) Signed: 12/13/2017 5:01:53 PM By: Montey Hora Entered By: Montey Hora on 12/12/2017 08:15:15 Chase Mendez ([id number]) -------------------------------------------------------------------------------- Wound Assessment Details Patient Name: Chase Mendez Date of Service: 12/12/2017 8:15 AM Medical Record Number: [id number] Patient Account Number: [id number] Date of Birth/Sex: [birth year] (83 y.o. Male) Treating RN: Montey Hora Primary Care Jacobi Nile: Pernell Dupre Other Clinician: Referring Coal Nearhood: Pernell Dupre Treating Derrek Puff/Extender: Ricard Dillon Weeks in Treatment: 2 Wound Status Wound Number: 2 Primary Trauma, Other Etiology: Wound Location: Right Hand - Palm - Dorsal Wound Open Wounding Event: Trauma Status: Date Acquired: 11/14/2017 Comorbid Chronic Obstructive Pulmonary Disease Weeks Of Treatment: 2 History: (COPD), Type II Diabetes, Neuropathy Clustered Wound: No Photos Photo Uploaded By: Montey Hora on 12/12/2017  10:18:44 Wound Measurements Length: (cm) 1.2 Width: (cm) 0.5 Depth: (cm) 0.1 Area: (cm) 0.471 Volume: (cm) 0.047 % Reduction in Area: 62.5% % Reduction in Volume: 93.8% Epithelialization: None Tunneling: No Undermining: Yes Starting Position (o'clock): 5 Ending Position (o'clock): 7 Maximum Distance: (cm) 0.4 Wound Description Full Thickness Without Exposed Support Classification: Structures Wound Margin: Flat and Intact Exudate Medium Amount: Exudate Type: Serous Exudate Color: amber Foul Odor After Cleansing: No Slough/Fibrino Yes Wound Bed Granulation Amount: Medium (34-66%) Exposed Structure Granulation Quality: Pink Fascia Exposed: No Necrotic Amount: Medium (34-66%) Fat Layer (Subcutaneous Tissue) Exposed: Yes MARCAS, BOWSHER ([id number]) Necrotic Quality: Adherent Slough Tendon Exposed: No Muscle Exposed: No Joint Exposed: No Bone Exposed: No Periwound Skin Texture Texture Color No Abnormalities Noted: No No Abnormalities Noted: No Callus: No Atrophie Blanche: No Crepitus: No Cyanosis: No Excoriation: No Ecchymosis: No Induration: No Erythema: No Rash: No Hemosiderin Staining: No Scarring: No Mottled: No Pallor: No Moisture Rubor: No No Abnormalities Noted: No Dry / Scaly: No Temperature / Pain Maceration: No Tenderness on Palpation: Yes Wound Preparation Ulcer Cleansing: Rinsed/Irrigated with Saline Topical Anesthetic Applied: Other: lidocaine 4%, Treatment Notes Wound #2 (Right, Dorsal Hand - Palm) Notes Iodosorb on hand sivercell on toe Electronic Signature(s) Signed: 12/13/2017 5:01:53 PM By: Montey Hora Entered By: Montey Hora on 12/12/2017 08:20:05 Chase Mendez ([id number]) -------------------------------------------------------------------------------- Vitals Details Patient Name: Chase Mendez Date of Service: 12/12/2017 8:15 AM Medical Record Number: [id number] Patient Account Number: [id number] Date of  Birth/Sex: [birth year] (83 y.o. Male) Treating RN: Cornell Barman Primary Care Aniela Caniglia: Pernell Dupre Other Clinician: Referring Sharanda Shinault: Pernell Dupre Treating Arlett Goold/Extender: Tito Dine in Treatment: 2 Vital Signs Time Taken: 08:07 Temperature (F): 98.3 Height (in): 72 Pulse (bpm): 55 Weight (lbs): 192.7 Respiratory Rate (breaths/min): 16 Body Mass Index (BMI): 26.1 Blood Pressure (mmHg): 162/65 Reference Range: 80 - 120 mg / dl Electronic Signature(s) Signed: 12/12/2017 12:16:48 PM By: Lorine Bears RCP, RRT, CHT Entered By: Becky Sax, Amado Nash on 12/12/2017 08:11:12

## 2017-12-19 ENCOUNTER — Encounter: Payer: Medicare Other | Admitting: Internal Medicine

## 2017-12-19 DIAGNOSIS — E11621 Type 2 diabetes mellitus with foot ulcer: Secondary | ICD-10-CM | POA: Diagnosis not present

## 2017-12-21 NOTE — Progress Notes (Signed)
JAVAD, SALVA ([id number]) Visit Report for 12/19/2017 Debridement Details Patient Name: Chase Mendez, Chase Mendez Date of Service: 12/19/2017 8:15 AM Medical Record Number: [id number] Patient Account Number: [id number] Date of Birth/Sex: 1934/12/15 (83 y.o. M) Treating RN: Cornell Barman Primary Care Provider: Pernell Dupre Other Clinician: Referring Provider: Pernell Dupre Treating Provider/Extender: Tito Dine in Treatment: 3 Debridement Performed for Wound #2 Right,Dorsal Hand - Palm Assessment: Performed By: Physician Ricard Dillon, MD Debridement Type: Debridement Level of Consciousness (Pre- Awake and Alert procedure): Pre-procedure Verification/Time Yes - 08:40 Out Taken: Start Time: 08:40 Pain Control: Lidocaine Total Area Debrided (L x W): 1 (cm) x 0.5 (cm) = 0.5 (cm) Tissue and other material Viable, Subcutaneous debrided: Level: Skin/Subcutaneous Tissue Debridement Description: Excisional Instrument: Curette Bleeding: Minimum Hemostasis Achieved: Pressure End Time: 08:42 Procedural Pain: 0 Response to Treatment: Procedure was tolerated well Level of Consciousness Awake and Alert (Post-procedure): Post Debridement Measurements of Total Wound Length: (cm) 1 Width: (cm) 0.5 Depth: (cm) 0.1 Volume: (cm) 0.039 Character of Wound/Ulcer Post Debridement: Stable Post Procedure Diagnosis Same as Pre-procedure Electronic Signature(s) Signed: 12/19/2017 5:24:02 PM By: Linton Ham MD Signed: 12/19/2017 5:34:01 PM By: Gretta Cool, BSN, RN, CWS, Kim RN, BSN Entered By: Linton Ham on 12/19/2017 09:02:26 Chase Mendez, Chase Mendez ([id number]) -------------------------------------------------------------------------------- HPI Details Patient Name: Chase Mendez Date of Service: 12/19/2017 8:15 AM Medical Record Number: [id number] Patient Account Number: [id number] Date of Birth/Sex: 01/03/1935 (83 y.o. M) Treating RN: Cornell Barman Primary Care Provider:  Pernell Dupre Other Clinician: Referring Provider: Pernell Dupre Treating Provider/Extender: Tito Dine in Treatment: 3 History of Present Illness HPI Description: ADMISSION 11/28/17 This is an 83 year old man who had an initial fall on October 3. Apparently he suffered a laceration of the left second toe that required 5 sutures. The sutures were removed in mid October. The patient traumatized the toe again 4 days ago while walking on a carpet. He apparently has left foot drop and he catches the forefoot when he walks. This reopened the toe. He was seen in the ER yesterday. An x-ray done of the toes suggested a fracture at the lateral base of the distal phalanx that was apparently well approximated. He was also noted to have an infection in the toe/cellulitis and was started on clindamycin. With regards to his right hand he had another fall on October 9. He suffered a laceration with a flap dehiscence over the Hypo-thenar eminence just distal to the wrist. He did not get this sutured however sometime after this he saw his primary care physician who tried to use Dermabond on this but it did not adhere. He is a diabetic but is on diet alone. His metformin was stopped when his blood sugars were in the normal range and his hemoglobin A1c was in the mid fives. He has foot drop secondary to apparently back issues. He has had a prior history of L5 radiculopathy. Beside this he has chronic gastritis hypothyroidism gastroesophageal reflux disease ABIs in our clinic were 1.13 bilaterally 12/05/17; follow-up for a patient that we admitted last week who had 2 fall and laceration injuries 1 on the hypo-thenar eminence of the right hand and the second on the left second toe. The left second toe required sutures which healed but then he retraumatized it.[name] He also had been started on clindamycin in the emergency room when we saw him last week for cellulitis of the second toe that extended  into the proximal dorsal foot. I don't believe there was an actual  culture. We used Iodoflex last week The area on the hypo-thenar eminence of the right hand is an interesting flap of tissue that failed Dermabond adhesion in his primary care office. Normally flaps of skin and subcutaneous tissue like this become ischemic however for some reason this is actually remaining viable. We put Iodoflex under it last week And compression to hopefully form adhesion. I had some thoughts that the flap of tissue would have to be removed and the wound would have to be healed from the bottom this in terms of promoting granulation 12/12/17; the area on the left second toe required debridement today however this looks a lot better. We've been using Iodoflex. The area on the hypo-thenar eminence is actually adhering we've been using Iodosorb. The patient had a epidermoid cysto Removed from the right lateral part of his face just below his ear. He is on doxycycline for this I did not look at this. 12/19/17; the left second toe is healed. There is nothing open here. oThe area on his hypo-thenar eminence Still is open at the tip. We've been using Iodosorb ointment Electronic Signature(s) Signed: 12/19/2017 5:24:02 PM By: Linton Ham MD Entered By: Linton Ham on 12/19/2017 09:03:21 Chase Mendez, Chase Mendez ([id number]) -------------------------------------------------------------------------------- Physical Exam Details Patient Name: Chase Mendez Date of Service: 12/19/2017 8:15 AM Medical Record Number: [id number] Patient Account Number: [id number] Date of Birth/Sex: 15-Aug-1934 (83 y.o. M) Treating RN: Cornell Barman Primary Care Provider: Pernell Dupre Other Clinician: Referring Provider: Pernell Dupre Treating Provider/Extender: Tito Dine in Treatment: 3 Constitutional Patient is hypertensive.. Pulse regular and within target range for patient.[name] Respirations regular, non-labored and  within target range.. Temperature is normal and within the target range for the patient.[name] appears in no distress. Notes Wound exam #1 right hypo-thenar eminence. The flap of skin is totally adherent inferiorly but still open at the tip I wonder if the underlying tissue was going to epithelialize. Using a #3 curet I removed debris and subcutaneous tissue from both surfaces to see if we can get more adherence hemostasis with direct pressure. #2 the area on the left second toe is healed. Scar tissue where the laceration was. He is to keep this protected with a thick Band-Aid or gauze Electronic Signature(s) Signed: 12/19/2017 5:24:02 PM By: Linton Ham MD Entered By: Linton Ham on 12/19/2017 09:04:45 Chase Mendez ([id number]) -------------------------------------------------------------------------------- Physician Orders Details Patient Name: Chase Mendez Date of Service: 12/19/2017 8:15 AM Medical Record Number: [id number] Patient Account Number: [id number] Date of Birth/Sex: 1934/12/04 (83 y.o. M) Treating RN: Cornell Barman Primary Care Provider: Pernell Dupre Other Clinician: Referring Provider: Pernell Dupre Treating Provider/Extender: Tito Dine in Treatment: 3 Verbal / Phone Orders: No Diagnosis Coding Wound Cleansing Wound #2 Right,Dorsal Hand - Palm o Clean wound with Normal Saline. Anesthetic (add to Medication List) Wound #2 Right,Dorsal Hand - Palm o Topical Lidocaine 4% cream applied to wound bed prior to debridement (In Clinic Only). Primary Wound Dressing Wound #2 Right,Dorsal Hand - Palm o Iodoflex Secondary Dressing Wound #2 Right,Dorsal Hand - Palm o Conform/Kerlix Dressing Change Frequency Wound #2 Right,Dorsal Hand - Palm o Change Dressing Monday, Wednesday, Friday Follow-up Appointments Wound #2 Right,Dorsal Hand - Palm o Return Appointment in 1 week. Home Health Wound #2 Echo Visits - [facility] Nurse may visit PRN to address patientos wound care needs. o FACE TO FACE ENCOUNTER: MEDICARE and MEDICAID PATIENTS: I certify that this patient is  under my care and that I had a face-to-face encounter that meets the physician face-to-face encounter requirements with this patient on this date. The encounter with the patient was in whole or in part for the following MEDICAL CONDITION: (primary reason for [place]) MEDICAL NECESSITY: I certify, that based on my findings, NURSING services are a medically necessary home health service. HOME BOUND STATUS: I certify that my clinical findings support that this patient is homebound (i.e., Due to illness or injury, pt requires aid of supportive devices such as crutches, cane, wheelchairs, walkers, the use of special transportation or the assistance of another person to leave their place of residence. There is a normal inability to leave the home and doing so requires considerable and taxing effort. Other absences are for medical reasons / religious services and are infrequent or of short duration when for other reasons). o If current dressing causes regression in wound condition, may D/C ordered dressing product/s and apply Normal Saline Moist Dressing daily until next [place] / Other MD appointment. [place] of regression in wound condition at [phone]. o Please direct any NON-WOUND related issues/requests for orders to patient's Primary Care Physician Chase Mendez, Chase Mendez ([id number]) Electronic Signature(s) Signed: 12/19/2017 5:24:02 PM By: Linton Ham MD Signed: 12/19/2017 5:34:01 PM By: Gretta Cool, BSN, RN, CWS, Kim RN, BSN Entered By: Gretta Cool, BSN, RN, CWS, Kim on 12/19/2017 08:42:43 Chase Mendez, Chase Mendez ([id number]) -------------------------------------------------------------------------------- Problem List Details Patient Name: Chase Mendez Date of  Service: 12/19/2017 8:15 AM Medical Record Number: [id number] Patient Account Number: [id number] Date of Birth/Sex: 1934-12-03 (83 y.o. 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 M) Treating RN: Cornell Barman Primary Care Provider: Pernell Dupre Other Clinician: Referring Provider: Pernell Dupre Treating Provider/Extender: Tito Dine in Treatment: 3 Subjective History of Present Illness (HPI) ADMISSION 11/28/17 This is an 83 year old man who had an initial fall on October 3. Apparently he suffered a laceration of the left second toe that required 5 sutures. The sutures were removed in mid October. The patient traumatized the toe again 4 days ago while walking on a carpet. He apparently has left foot drop and he catches the forefoot when he walks. This reopened the toe. He was seen in the ER yesterday. An x-ray done of the toes suggested a fracture at the lateral  base of the distal phalanx that was apparently well approximated. He was also noted to have an infection in the toe/cellulitis and was started on clindamycin. With regards to his right hand he had another fall on October 9. He suffered a laceration with a flap dehiscence over the Hypo-thenar eminence just distal to the wrist. He did not get this sutured however sometime after this he saw  his primary care physician who tried to use Dermabond on this but it did not adhere. He is a diabetic but is on diet alone. His metformin was stopped when his blood sugars were in the normal range and his hemoglobin A1c was in the mid fives. He has foot drop secondary to apparently back issues. He has had a prior history of L5 radiculopathy. Beside this he has chronic gastritis hypothyroidism gastroesophageal reflux disease ABIs in our clinic were 1.13 bilaterally 12/05/17; follow-up for a patient that we admitted last week who had 2 fall and laceration injuries 1 on the hypo-thenar eminence of the right hand and the second on the left second toe. The left second toe required sutures which healed but then he retraumatized it.[name] He also had been started on clindamycin in the emergency room when we saw him last week for cellulitis of the second toe that extended into the proximal dorsal foot. I don't believe there was an actual culture. We used Iodoflex last week The area on the hypo-thenar eminence of the right hand is an interesting flap of tissue that failed Dermabond adhesion in his primary care office. Normally flaps of skin and subcutaneous tissue like this become ischemic however for some reason this is actually remaining viable. We put Iodoflex under it last week And compression to hopefully form adhesion. I had some thoughts that the flap of tissue would have to be removed and the wound would have to be healed from the bottom this in terms of promoting granulation 12/12/17; the area on the left second toe  required debridement today however this looks a lot better. We've been using Iodoflex. The area on the hypo-thenar eminence is actually adhering we've been using Iodosorb. The patient had a epidermoid cysto Removed from the right lateral part of his face just below his ear. He is on doxycycline for this I did not look at this. 12/19/17; the left second toe is healed. There is nothing open here. The area on his hypo-thenar eminence Still is open at the tip. We've been using Iodosorb ointment Objective Chase Mendez, Chase Mendez ([id number]) Constitutional Patient is hypertensive.. Pulse regular and within target range for patient.[name] Respirations regular, non-labored and within target range.. Temperature is normal and within the target range for the patient.[name] appears in no distress. Vitals Time Taken: 8:08 AM, Height: 72 in, Weight: 192.7 lbs, BMI: 26.1, Temperature: 97.9 F, Pulse: 58 bpm, Respiratory Rate: 16 breaths/min, Blood Pressure: 155/66 mmHg. General Notes: Wound exam #1 right hypo-thenar eminence. The flap of skin is totally adherent inferiorly but still open at the tip I wonder if the underlying tissue was going to epithelialize. Using a #3 curet I removed debris and subcutaneous tissue from both surfaces to see if we can get more adherence hemostasis with direct pressure. #2 the area on the left second toe is healed. Scar tissue where the laceration was. He is to keep this protected with a thick Band-Aid or gauze Integumentary (Hair, Skin) Wound #1 status is Healed - Epithelialized. Original cause of wound was Trauma. The wound is located on the Left,Circumferential Toe Second. The wound measures 0cm length x 0cm width x 0cm depth; 0cm^2 area and 0cm^3 volume. There is Fat Layer (Subcutaneous Tissue) Exposed exposed. There is no tunneling or undermining noted. There is a medium amount of serous drainage noted. The wound margin is flat and intact. There is no granulation within the wound bed.  There is a large (67-100%) amount of necrotic tissue within the wound  bed including Eschar. The periwound skin appearance did not exhibit: Callus, Crepitus, Excoriation, Induration, Rash, Scarring, Dry/Scaly, Maceration, Atrophie Blanche, Cyanosis, Ecchymosis, Hemosiderin Staining, Mottled, Pallor, Rubor, Erythema. Wound #2 status is Open. Original cause of wound was Trauma. The wound is located on the Right,Dorsal Hand - Palm. The wound measures 1cm length x 0.5cm width x 0.1cm depth; 0.393cm^2 area and 0.039cm^3 volume. There is Fat Layer (Subcutaneous Tissue) Exposed exposed. There is no tunneling noted, however, there is undermining starting at 5:00 and ending at 7:00 with a maximum distance of 0.3cm. There is a medium amount of serous drainage noted. The wound margin is flat and intact. There is medium (34-66%) pink granulation within the wound bed. There is a medium (34-66%) amount of necrotic tissue within the wound bed including Adherent Slough. The periwound skin appearance did not exhibit: Callus, Crepitus, Excoriation, Induration, Rash, Scarring, Dry/Scaly, Maceration, Atrophie Blanche, Cyanosis, Ecchymosis, Hemosiderin Staining, Mottled, Pallor, Rubor, Erythema. The periwound has tenderness on palpation. Assessment Active Problems ICD-10 Unspecified open wound of right hand, subsequent encounter Type 2 diabetes mellitus with foot ulcer Non-pressure chronic ulcer of other part of left foot with necrosis of bone Cellulitis of left lower limb Procedures Wound #2 Pre-procedure diagnosis of Wound #2 is a Trauma, Other located on the Right,Dorsal Hand - Palm . There was a Excisional Skin/Subcutaneous Tissue Debridement with a total area of 0.5 sq cm performed by Ricard Dillon, MD. With the following instrument(s): Curette to remove Viable tissue/material. Material removed includes Subcutaneous Tissue after achieving pain control using Lidocaine. No specimens were taken. A time out  was conducted at 08:40, prior to the start of the Chase Mendez, Chase Mendez. ([id number]) procedure. A Minimum amount of bleeding was controlled with Pressure. The procedure was tolerated well with a pain level of 0 throughout. Post Debridement Measurements: 1cm length x 0.5cm width x 0.1cm depth; 0.039cm^3 volume. Character of Wound/Ulcer Post Debridement is stable. Post procedure Diagnosis Wound #2: Same as Pre-Procedure Plan Wound Cleansing: Wound #2 Right,Dorsal Hand - Palm: Clean wound with Normal Saline. Anesthetic (add to Medication List): Wound #2 Right,Dorsal Hand - Palm: Topical Lidocaine 4% cream applied to wound bed prior to debridement (In Clinic Only). Primary Wound Dressing: Wound #2 Right,Dorsal Hand - Palm: Iodoflex Secondary Dressing: Wound #2 Right,Dorsal Hand - Palm: Conform/Kerlix Dressing Change Frequency: Wound #2 Right,Dorsal Hand - Palm: Change Dressing Monday, Wednesday, Friday Follow-up Appointments: Wound #2 Right,Dorsal Hand - Palm: Return Appointment in 1 week. Home Health: Wound #2 Right,Dorsal Hand - Palm: [place] Visits - [facility] Nurse may visit PRN to address patient s wound care needs. FACE TO FACE ENCOUNTER: MEDICARE and MEDICAID PATIENTS: I certify that this patient is under my care and that I had a face-to-face encounter that meets the physician face-to-face encounter requirements with this patient on this date. The encounter with the patient was in whole or in part for the following MEDICAL CONDITION: (primary reason for [place]) MEDICAL NECESSITY: I certify, that based on my findings, NURSING services are a medically necessary home health service. HOME BOUND STATUS: I certify that my clinical findings support that this patient is homebound (i.e., Due to illness or injury, pt requires aid of supportive devices such as crutches, cane, wheelchairs, walkers, the use of special transportation or the assistance of another  person to leave their place of residence. There is a normal inability to leave the home and doing so requires considerable and taxing effort. Other absences are for medical  reasons / religious services and are infrequent or of short duration when for other reasons). If current dressing causes regression in wound condition, may D/C ordered dressing product/s and apply Normal Saline Moist Dressing daily until next [place] / Other MD appointment. [place] of regression in wound condition at [phone]. Please direct any NON-WOUND related issues/requests for orders to patient's Primary Care Physician #1 I'm continuing Iodoflex to the right palmar hand #2 debridement today to see if we can get more tissue adherence. I also wonder whether the area underneath his epithelializing Electronic Signature(s) Chase Mendez, Chase Mendez ([id number]) Signed: 12/19/2017 5:24:02 PM By: Linton Ham MD Entered By: Linton Ham on 12/19/2017 09:08:02 Chase Mendez, Chase Mendez ([id number]) -------------------------------------------------------------------------------- SuperBill Details Patient Name: Chase Mendez Date of Service: 12/19/2017 Medical Record Number: [id number] Patient Account Number: [id number] Date of Birth/Sex: 1934/04/25 (83 y.o. M) Treating RN: Cornell Barman Primary Care Provider: Pernell Dupre Other Clinician: Referring Provider: Pernell Dupre Treating Provider/Extender: Tito Dine in Treatment: 3 Diagnosis Coding ICD-10 Codes Code Description S61.401D Unspecified open wound of right hand, subsequent encounter E11.621 Type 2 diabetes mellitus with foot ulcer L97.524 Non-pressure chronic ulcer of other part of left foot with necrosis of bone L03.116 Cellulitis of left lower limb Facility Procedures CPT4 Code: [id number] Description: 32919 - DEB SUBQ TISSUE 20 SQ CM/< ICD-10 Diagnosis Description S61.401D Unspecified open wound of right hand,  subsequent encounte Modifier: r Quantity: 1 Physician Procedures CPT4 Code: [id number] Description: 45997 - WC PHYS SUBQ TISS 20 SQ CM ICD-10 Diagnosis Description S61.401D Unspecified open wound of right hand, subsequent encounte Modifier: r Quantity: 1 Electronic Signature(s) Signed: 12/19/2017 5:24:02 PM By: Linton Ham MD Entered By: Linton Ham on 12/19/2017 09:08:20

## 2017-12-21 NOTE — Progress Notes (Signed)
Chase Mendez, Chase Mendez ([id number]) Visit Report for 12/19/2017 Arrival Information Details Patient Name: Chase Mendez, Chase Mendez Date of Service: 12/19/2017 8:15 AM Medical Record Number: [id number] Patient Account Number: [id number] Date of Birth/Sex: Jun 20, 1934 (83 y.o. M) Treating RN: Cornell Barman Primary Care Josemaria Brining: Pernell Dupre Other Clinician: Referring Kriss Ishler: Pernell Dupre Treating Kayline Sheer/Extender: Tito Dine in Treatment: 3 Visit Information History Since Last Visit Added or deleted any medications: No Patient Arrived: Walker Any new allergies or adverse reactions: No Arrival Time: 08:09 Had a fall or experienced change in No Accompanied By: wife activities of daily living that may affect Transfer Assistance: None risk of falls: Patient Identification Verified: Yes Signs or symptoms of abuse/neglect since last visito No Secondary Verification Process Completed: Yes Hospitalized since last visit: No Implantable device outside of the clinic excluding No cellular tissue based products placed in the center since last visit: Has Dressing in Place as Prescribed: Yes Pain Present Now: No Electronic Signature(s) Signed: 12/19/2017 2:42:47 PM By: Lorine Bears RCP, RRT, CHT Entered By: Lorine Bears on 12/19/2017 08:09:59 CATALDO, COSGRIFF ([id number]) -------------------------------------------------------------------------------- Encounter Discharge Information Details Patient Name: Chase Mendez Date of Service: 12/19/2017 8:15 AM Medical Record Number: [id number] Patient Account Number: [id number] Date of Birth/Sex: Oct 26, 1934 (83 y.o. M) Treating RN: Cornell Barman Primary Care Jaidin Ugarte: Pernell Dupre Other Clinician: Referring Ommie Degeorge: Pernell Dupre Treating Algis Lehenbauer/Extender: Tito Dine in Treatment: 3 Encounter Discharge Information Items Post Procedure Vitals Discharge Condition: Stable Temperature  (F): 97.9 Ambulatory Status: Walker Pulse (bpm): 58 Discharge Destination: Home Respiratory Rate (breaths/min): 16 Transportation: Other Blood Pressure (mmHg): 155/66 Accompanied By: self Schedule Follow-up Appointment: Yes Clinical Summary of Care: Electronic Signature(s) Signed: 12/19/2017 10:14:09 AM By: Gretta Cool, BSN, RN, CWS, Kim RN, BSN Entered By: Gretta Cool, BSN, RN, CWS, Kim on 12/19/2017 10:14:08 Chase Mendez ([id number]) -------------------------------------------------------------------------------- Lower Extremity Assessment Details Patient Name: Chase Mendez Date of Service: 12/19/2017 8:15 AM Medical Record Number: [id number] Patient Account Number: [id number] Date of Birth/Sex: 1935/01/16 (83 y.o. M) Treating RN: Montey Hora Primary Care David Rodriquez: Pernell Dupre Other Clinician: Referring Bobbijo Holst: Pernell Dupre Treating Jamey Harman/Extender: Tito Dine in Treatment: 3 Vascular Assessment Pulses: Dorsalis Pedis Palpable: [Left:Yes] Posterior Tibial Extremity colors, hair growth, and conditions: Extremity Color: [Left:Hyperpigmented] Hair Growth on Extremity: [Left:No] Temperature of Extremity: [Left:Warm] Capillary Refill: [Left:< 3 seconds] Toe Nail Assessment Left: Right: Thick: Yes Discolored: Yes Deformed: Yes Improper Length and Hygiene: No Electronic Signature(s) Signed: 12/19/2017 5:11:22 PM By: Montey Hora Entered By: Montey Hora on 12/19/2017 08:20:24 Chase Mendez ([id number]) -------------------------------------------------------------------------------- Multi Wound Chart Details Patient Name: Chase Mendez Date of Service: 12/19/2017 8:15 AM Medical Record Number: [id number] Patient Account Number: [id number] Date of Birth/Sex: [birth year] (83 y.o. M) Treating RN: Cornell Barman Primary Care Saniah Schroeter: Pernell Dupre Other Clinician: Referring Rahkeem Senft: Pernell Dupre Treating Spruha Weight/Extender: Tito Dine in Treatment: 3 Vital Signs Height(in): 72 Pulse(bpm): 110 Weight(lbs): 192.7 Blood Pressure(mmHg): 155/66 Body Mass Index(BMI): 26 Temperature(F): 97.9 Respiratory Rate 16 (breaths/min): Photos: [1:No Photos] [2:No Photos] [N/A:N/A] Wound Location: [1:Left, Circumferential Toe Second] [2:Right Hand - Palm - Dorsal] [N/A:N/A] Wounding Event: [1:Trauma] [2:Trauma] [N/A:N/A] Primary Etiology: [1:Trauma, Other] [2:Trauma, Other] [N/A:N/A] Comorbid History: [1:Chronic Obstructive Pulmonary Disease (COPD), Type II Diabetes, Neuropathy] [2:Chronic Obstructive Pulmonary Disease (COPD), Type II Diabetes, Neuropathy] [N/A:N/A] Date Acquired: [1:11/08/2017] [2:11/14/2017] [N/A:N/A] Weeks of Treatment: [1:3] [2:3] [N/A:N/A] Wound Status: [1:Healed - Epithelialized] [2:Open] [N/A:N/A] Measurements L x W x D [1:0x0x0] [2:1x0.5x0.1] [N/A:N/A] (cm) Area (cm) : [1:0] [2:7.062] [  N/A:N/A] Volume (cm) : [1:0] [2:0.039] [N/A:N/A] % Reduction in Area: [1:100.00%] [2:68.70%] [N/A:N/A] % Reduction in Volume: [1:100.00%] [2:94.80%] [N/A:N/A] Starting Position 1 [2:5] (o'clock): Ending Position 1 [2:7] (o'clock): Maximum Distance 1 (cm): [2:0.3] Undermining: [1:No] [2:Yes] [N/A:N/A] Classification: [1:Full Thickness Without Exposed Support Structures] [2:Full Thickness Without Exposed Support Structures] [N/A:N/A] Exudate Amount: [1:Medium] [2:Medium] [N/A:N/A] Exudate Type: [1:Serous] [2:Serous] [N/A:N/A] Exudate Color: [1:amber] [2:amber] [N/A:N/A] Wound Margin: [1:Flat and Intact] [2:Flat and Intact] [N/A:N/A] Granulation Amount: [1:None Present (0%)] [2:Medium (34-66%)] [N/A:N/A] Granulation Quality: [1:N/A] [2:Pink] [N/A:N/A] Necrotic Amount: [1:Large (67-100%)] [2:Medium (34-66%)] [N/A:N/A] Necrotic Tissue: [1:Eschar] [2:Adherent Slough] [N/A:N/A] Exposed Structures: [1:Fat Layer (Subcutaneous Tissue) Exposed: Yes Fascia: No Tendon: No] [2:Fat Layer (Subcutaneous Tissue)  Exposed: Yes Fascia: No Tendon: No] [N/A:N/A] Muscle: No Muscle: No Joint: No Joint: No Bone: No Bone: No Epithelialization: None None N/A Debridement: N/A Debridement - Excisional N/A Pre-procedure N/A 08:40 N/A Verification/Time Out Taken: Pain Control: N/A Lidocaine N/A Tissue Debrided: N/A Subcutaneous N/A Level: N/A Skin/Subcutaneous Tissue N/A Debridement Area (sq cm): N/A 0.5 N/A Instrument: N/A Curette N/A Bleeding: N/A Minimum N/A Hemostasis Achieved: N/A Pressure N/A Procedural Pain: N/A 0 N/A Debridement Treatment N/A Procedure was tolerated well N/A Response: Post Debridement N/A 1x0.5x0.1 N/A Measurements L x W x D (cm) Post Debridement Volume: N/A 0.039 N/A (cm) Periwound Skin Texture: Excoriation: No Excoriation: No N/A Induration: No Induration: No Callus: No Callus: No Crepitus: No Crepitus: No Rash: No Rash: No Scarring: No Scarring: No Periwound Skin Moisture: Maceration: No Maceration: No N/A Dry/Scaly: No Dry/Scaly: No Periwound Skin Color: Atrophie Blanche: No Atrophie Blanche: No N/A Cyanosis: No Cyanosis: No Ecchymosis: No Ecchymosis: No Erythema: No Erythema: No Hemosiderin Staining: No Hemosiderin Staining: No Mottled: No Mottled: No Pallor: No Pallor: No Rubor: No Rubor: No Tenderness on Palpation: No Yes N/A Wound Preparation: Ulcer Cleansing: Ulcer Cleansing: N/A Rinsed/Irrigated with Saline Rinsed/Irrigated with Saline Topical Anesthetic Applied: Topical Anesthetic Applied: Other: lidocaine 4% Other: lidocaine 4% Procedures Performed: N/A Debridement N/A Treatment Notes Electronic Signature(s) Signed: 12/19/2017 5:24:02 PM By: Linton Ham MD Entered By: Linton Ham on 12/19/2017 09:00:57 Chase Mendez ([id number]) -------------------------------------------------------------------------------- Multi-Disciplinary Care Plan Details Patient Name: Chase Mendez Date of Service: 12/19/2017 8:15  AM Medical Record Number: [id number] Patient Account Number: [id number] Date of Birth/Sex: 09/19/1934 (83 y.o. M) Treating RN: Montey Hora Primary Care Rynlee Lisbon: Pernell Dupre Other Clinician: Referring Aydden Cumpian: Pernell Dupre Treating Antino Mayabb/Extender: Tito Dine in Treatment: 3 Active Inactive ` Abuse / Safety / Falls / Self Care Management Nursing Diagnoses: History of Falls Impaired physical mobility Potential for falls Goals: Patient will remain injury free related to falls Date Initiated: 11/28/2017 Target Resolution Date: 12/12/2017 Goal Status: Active Patient/caregiver will verbalize understanding of skin care regimen Date Initiated: 11/28/2017 Target Resolution Date: 12/12/2017 Goal Status: Active Interventions: Assess fall risk on admission and as needed Notes: ` Medication Nursing Diagnoses: Knowledge deficit related to medication safety: actual or potential Goals: Patient/caregiver will demonstrate understanding of all current medications Date Initiated: 11/28/2017 Target Resolution Date: 12/12/2017 Goal Status: Active Patient/caregiver will demonstrate understanding of new oral/IV medications prescribed at the [facility] (topical prescriptions are covered under the skin breakdown problem) Date Initiated: 11/28/2017 Target Resolution Date: 12/12/2017 Goal Status: Active Interventions: Assess for medication contraindications each visit where new medications are prescribed Notes: ` Orientation to the [facility] Chase Mendez, Chase Mendez [place]. ([id number]) Nursing Diagnoses: Knowledge deficit related to the wound healing center program Goals: Patient/caregiver will verbalize understanding of the [place] [facility] Date  Initiated: 11/28/2017 Target Resolution Date: 12/12/2017 Goal Status: Active Interventions: Provide education on orientation to the wound center Notes: ` Wound/Skin Impairment Nursing Diagnoses: Impaired tissue  integrity Goals: Ulcer/skin breakdown will have a volume reduction of 30% by week 4 Date Initiated: 11/28/2017 Target Resolution Date: 12/12/2017 Goal Status: Active Interventions: Assess ulceration(s) every visit Treatment Activities: Referred to DME Trenna Kiely for dressing supplies : 11/28/2017 Skin care regimen initiated : 11/28/2017 Notes: Electronic Signature(s) Signed: 12/19/2017 5:11:22 PM By: Montey Hora Signed: 12/19/2017 5:34:01 PM By: Gretta Cool, BSN, RN, CWS, Kim RN, BSN Entered By: Gretta Cool, BSN, RN, CWS, Kim on 12/19/2017 08:35:29 Chase Mendez, Chase Mendez ([id number]) -------------------------------------------------------------------------------- Pain Assessment Details Patient Name: Chase Mendez Date of Service: 12/19/2017 8:15 AM Medical Record Number: [id number] Patient Account Number: [id number] Date of Birth/Sex: Apr 14, 1934 (83 y.o. M) Treating RN: Cornell Barman Primary Care Elika Godar: Pernell Dupre Other Clinician: Referring Koraima Albertsen: Pernell Dupre Treating Brigitt Mcclish/Extender: Tito Dine in Treatment: 3 Active Problems Location of Pain Severity and Description of Pain Patient Has Paino No Site Locations Pain Management and Medication Current Pain Management: Electronic Signature(s) Signed: 12/19/2017 2:42:47 PM By: Lorine Bears RCP, RRT, CHT Signed: 12/19/2017 5:34:01 PM By: Gretta Cool, BSN, RN, CWS, Kim RN, BSN Entered By: Lorine Bears on 12/19/2017 08:10:07 Chase Mendez ([id number]) -------------------------------------------------------------------------------- Patient/Caregiver Education Details Patient Name: Chase Mendez Date of Service: 12/19/2017 8:15 AM Medical Record Number: [id number] Patient Account Number: [id number] Date of Birth/Gender: 21-Dec-1934 (83 y.o. M) Treating RN: Cornell Barman Primary Care Physician: Pernell Dupre Other Clinician: Referring Physician: Pernell Dupre Treating  Physician/Extender: Tito Dine in Treatment: 3 Education Assessment Education Provided To: Patient Education Topics Provided Wound/Skin Impairment: Handouts: Caring for Your Ulcer Methods: Demonstration, Explain/Verbal Responses: State content correctly Electronic Signature(s) Signed: 12/19/2017 5:34:01 PM By: Gretta Cool, BSN, RN, CWS, Kim RN, BSN Entered By: Gretta Cool, BSN, RN, CWS, Kim on 12/19/2017 10:14:20 Chase Mendez, Chase Mendez ([id number]) -------------------------------------------------------------------------------- Wound Assessment Details Patient Name: Chase Mendez Date of Service: 12/19/2017 8:15 AM Medical Record Number: [id number] Patient Account Number: [id number] Date of Birth/Sex: Feb 10, 1934 (83 y.o. M) Treating RN: Cornell Barman Primary Care Deziah Renwick: Pernell Dupre Other Clinician: Referring Regla Fitzgibbon: Pernell Dupre Treating Doneen Ollinger/Extender: Tito Dine in Treatment: 3 Wound Status Wound Number: 1 Primary Trauma, Other Etiology: Wound Location: Left, Circumferential Toe Second Wound Healed - Epithelialized Wounding Event: Trauma Status: Date Acquired: 11/08/2017 Comorbid Chronic Obstructive Pulmonary Disease Weeks Of Treatment: 3 History: (COPD), Type II Diabetes, Neuropathy Clustered Wound: No Photos Photo Uploaded By: Secundino Ginger on 12/19/2017 13:34:42 Wound Measurements Length: (cm) 0 % R Width: (cm) 0 % R Depth: (cm) 0 Epi Area: (cm) 0 Tu Volume: (cm) 0 Un eduction in Area: 100% eduction in Volume: 100% thelialization: None nneling: No dermining: No Wound Description Full Thickness Without Exposed Support Classification: Structures Wound Margin: Flat and Intact Exudate Medium Amount: Exudate Type: Serous Exudate Color: amber Foul Odor After Cleansing: No Slough/Fibrino No Wound Bed Granulation Amount: None Present (0%) Exposed Structure Necrotic Amount: Large (67-100%) Fascia Exposed: No Necrotic Quality:  Eschar Fat Layer (Subcutaneous Tissue) Exposed: Yes Tendon Exposed: No Muscle Exposed: No Joint Exposed: No Bone Exposed: No Chase Mendez, Chase Mendez. ([id number]) Periwound Skin Texture Texture Color No Abnormalities Noted: No No Abnormalities Noted: No Callus: No Atrophie Blanche: No Crepitus: No Cyanosis: No Excoriation: No Ecchymosis: No Induration: No Erythema: No Rash: No Hemosiderin Staining: No Scarring: No Mottled: No Pallor: No Moisture Rubor: No No Abnormalities Noted: No Dry / Scaly: No Maceration: No  Wound Preparation Ulcer Cleansing: Rinsed/Irrigated with Saline Topical Anesthetic Applied: Other: lidocaine 4%, Electronic Signature(s) Signed: 12/19/2017 5:34:01 PM By: Gretta Cool, BSN, RN, CWS, Kim RN, BSN Entered By: Gretta Cool, BSN, RN, CWS, Kim on 12/19/2017 08:39:19 Chase Mendez, Chase Mendez ([id number]) -------------------------------------------------------------------------------- Wound Assessment Details Patient Name: Chase Mendez Date of Service: 12/19/2017 8:15 AM Medical Record Number: [id number] Patient Account Number: [id number] Date of Birth/Sex: 11-23-1934 (83 y.o. M) Treating RN: Montey Hora Primary Care Jairy Angulo: Pernell Dupre Other Clinician: Referring Letrice Pollok: Pernell Dupre Treating Karista Aispuro/Extender: Tito Dine in Treatment: 3 Wound Status Wound Number: 2 Primary Trauma, Other Etiology: Wound Location: Right Hand - Palm - Dorsal Wound Open Wounding Event: Trauma Status: Date Acquired: 11/14/2017 Comorbid Chronic Obstructive Pulmonary Disease Weeks Of Treatment: 3 History: (COPD), Type II Diabetes, Neuropathy Clustered Wound: No Photos Photo Uploaded By: Secundino Ginger on 12/19/2017 13:34:42 Wound Measurements Length: (cm) 1 Width: (cm) 0.5 Depth: (cm) 0.1 Area: (cm) 0.393 Volume: (cm) 0.039 % Reduction in Area: 68.7% % Reduction in Volume: 94.8% Epithelialization: None Tunneling: No Undermining: Yes Starting Position  (o'clock): 5 Ending Position (o'clock): 7 Maximum Distance: (cm) 0.3 Wound Description Full Thickness Without Exposed Support Classification: Structures Wound Margin: Flat and Intact Exudate Medium Amount: Exudate Type: Serous Exudate Color: amber Foul Odor After Cleansing: No Slough/Fibrino Yes Wound Bed Granulation Amount: Medium (34-66%) Exposed Structure Granulation Quality: Pink Fascia Exposed: No Necrotic Amount: Medium (34-66%) Fat Layer (Subcutaneous Tissue) Exposed: Yes Chase Mendez, Chase Mendez ([id number]) Necrotic Quality: Adherent Slough Tendon Exposed: No Muscle Exposed: No Joint Exposed: No Bone Exposed: No Periwound Skin Texture Texture Color No Abnormalities Noted: No No Abnormalities Noted: No Callus: No Atrophie Blanche: No Crepitus: No Cyanosis: No Excoriation: No Ecchymosis: No Induration: No Erythema: No Rash: No Hemosiderin Staining: No Scarring: No Mottled: No Pallor: No Moisture Rubor: No No Abnormalities Noted: No Dry / Scaly: No Temperature / Pain Maceration: No Tenderness on Palpation: Yes Wound Preparation Ulcer Cleansing: Rinsed/Irrigated with Saline Topical Anesthetic Applied: Other: lidocaine 4%, Treatment Notes Wound #2 (Right, Dorsal Hand - Palm) Notes Iodosorb on hand, coverlet on toe Electronic Signature(s) Signed: 12/19/2017 5:11:22 PM By: Montey Hora Entered By: Montey Hora on 12/19/2017 08:19:47 Chase Mendez ([id number]) -------------------------------------------------------------------------------- Vitals Details Patient Name: Chase Mendez Date of Service: 12/19/2017 8:15 AM Medical Record Number: [id number] Patient Account Number: [id number] Date of Birth/Sex: Jan 03, 1935 (83 y.o. M) Treating RN: Cornell Barman Primary Care Keayra Graham: Pernell Dupre Other Clinician: Referring Rayvn Rickerson: Pernell Dupre Treating Lacara Dunsworth/Extender: Tito Dine in Treatment: 3 Vital Signs Time Taken:  08:08 Temperature (F): 97.9 Height (in): 72 Pulse (bpm): 58 Weight (lbs): 192.7 Respiratory Rate (breaths/min): 16 Body Mass Index (BMI): 26.1 Blood Pressure (mmHg): 155/66 Reference Range: 80 - 120 mg / dl Electronic Signature(s) Signed: 12/19/2017 2:42:47 PM By: Lorine Bears RCP, RRT, CHT Entered By: Lorine Bears on 12/19/2017 08:15:41

## 2017-12-26 ENCOUNTER — Encounter: Payer: Medicare Other | Admitting: Internal Medicine

## 2017-12-26 DIAGNOSIS — E11621 Type 2 diabetes mellitus with foot ulcer: Secondary | ICD-10-CM | POA: Diagnosis not present

## 2017-12-28 NOTE — Progress Notes (Signed)
TRAYE, BATES ([id number]) Visit Report for 12/26/2017 Arrival Information Details Patient Name: Chase Mendez, Chase Mendez Date of Service: 12/26/2017 8:15 AM Medical Record Number: [id number] Patient Account Number: [id number] Date of Birth/Sex: 08-Nov-1934 (83 y.o. M) Treating RN: Cornell Barman Primary Care Osamah Schmader: Pernell Dupre Other Clinician: Referring Uchenna Seufert: Pernell Dupre Treating Sally-Ann Cutbirth/Extender: Tito Dine in Treatment: 4 Visit Information History Since Last Visit Added or deleted any medications: No Patient Arrived: Walker Any new allergies or adverse reactions: No Arrival Time: 08:14 Had a fall or experienced change in No Accompanied By: wife activities of daily living that may affect Transfer Assistance: None risk of falls: Patient Identification Verified: Yes Signs or symptoms of abuse/neglect since last visito No Secondary Verification Process Completed: Yes Hospitalized since last visit: No Implantable device outside of the clinic excluding No cellular tissue based products placed in the center since last visit: Has Dressing in Place as Prescribed: Yes Pain Present Now: No Electronic Signature(s) Signed: 12/26/2017 11:20:12 AM By: Lorine Bears RCP, RRT, CHT Entered By: Lorine Bears on 12/26/2017 08:14:41 Chase Mendez ([id number]) -------------------------------------------------------------------------------- Clinic Level of Care Assessment Details Patient Name: Chase Mendez Date of Service: 12/26/2017 8:15 AM Medical Record Number: [id number] Patient Account Number: [id number] Date of Birth/Sex: 07-May-1934 (83 y.o. M) Treating RN: Cornell Barman Primary Care Honour Schwieger: Pernell Dupre Other Clinician: Referring Muslima Toppins: Pernell Dupre Treating Mercie Balsley/Extender: Tito Dine in Treatment: 4 Clinic Level of Care Assessment Items TOOL 4 Quantity Score []  - Use when only an EandM is performed  on FOLLOW-UP visit 0 ASSESSMENTS - Nursing Assessment / Reassessment []  - Reassessment of Co-morbidities (includes updates in patient status) 0 X- 1 5 Reassessment of Adherence to Treatment Plan ASSESSMENTS - Wound and Skin Assessment / Reassessment X - Simple Wound Assessment / Reassessment - one wound 1 5 []  - 0 Complex Wound Assessment / Reassessment - multiple wounds []  - 0 Dermatologic / Skin Assessment (not related to wound area) ASSESSMENTS - Focused Assessment []  - Circumferential Edema Measurements - multi extremities 0 []  - 0 Nutritional Assessment / Counseling / Intervention []  - 0 Lower Extremity Assessment (monofilament, tuning fork, pulses) []  - 0 Peripheral Arterial Disease Assessment (using hand held doppler) ASSESSMENTS - Ostomy and/or Continence Assessment and Care []  - Incontinence Assessment and Management 0 []  - 0 Ostomy Care Assessment and Management (repouching, etc.) PROCESS - Coordination of Care X - Simple Patient / Family Education for ongoing care 1 15 []  - 0 Complex (extensive) Patient / Family Education for ongoing care []  - 0 Staff obtains Programmer, systems, Records, Test Results / Process Orders []  - 0 Staff telephones HHA, Nursing Homes / Clarify orders / etc []  - 0 Routine Transfer to another Facility (non-emergent condition) []  - 0 Routine Hospital Admission (non-emergent condition) []  - 0 New Admissions / Biomedical engineer / Ordering NPWT, Apligraf, etc. []  - 0 Emergency Hospital Admission (emergent condition) X- 1 10 Simple Discharge Coordination CONTRELL, BALLENTINE ([id number]) []  - 0 Complex (extensive) Discharge Coordination PROCESS - Special Needs []  - Pediatric / Minor Patient Management 0 []  - 0 Isolation Patient Management []  - 0 Hearing / Language / Visual special needs []  - 0 Assessment of Community assistance (transportation, D/C planning, etc.) []  - 0 Additional assistance / Altered mentation []  - 0 Support Surface(s)  Assessment (bed, cushion, seat, etc.) INTERVENTIONS - Wound Cleansing / Measurement X - Simple Wound Cleansing - one wound 1 5 []  - 0 Complex Wound Cleansing - multiple wounds X-  1 5 Wound Imaging (photographs - any number of wounds) []  - 0 Wound Tracing (instead of photographs) X- 1 5 Simple Wound Measurement - one wound []  - 0 Complex Wound Measurement - multiple wounds INTERVENTIONS - Wound Dressings []  - Small Wound Dressing one or multiple wounds 0 X- 1 15 Medium Wound Dressing one or multiple wounds []  - 0 Large Wound Dressing one or multiple wounds []  - 0 Application of Medications - topical []  - 0 Application of Medications - injection INTERVENTIONS - Miscellaneous []  - External ear exam 0 []  - 0 Specimen Collection (cultures, biopsies, blood, body fluids, etc.) []  - 0 Specimen(s) / Culture(s) sent or taken to Lab for analysis []  - 0 Patient Transfer (multiple staff / Civil Service fast streamer / Similar devices) []  - 0 Simple Staple / Suture removal (25 or less) []  - 0 Complex Staple / Suture removal (26 or more) []  - 0 Hypo / Hyperglycemic Management (close monitor of Blood Glucose) []  - 0 Ankle / Brachial Index (ABI) - do not check if billed separately X- 1 5 Vital Signs NICHOLSON, STARACE ([id number]) Has the patient been seen at the hospital within the last three years: Yes Total Score: 70 Level Of Care: New/Established - Level 2 Electronic Signature(s) Signed: 12/26/2017 5:21:06 PM By: Gretta Cool, BSN, RN, CWS, Kim RN, BSN Entered By: Gretta Cool, BSN, RN, CWS, Kim on 12/26/2017 08:39:49 KANNEN, MOXEY ([id number]) -------------------------------------------------------------------------------- Encounter Discharge Information Details Patient Name: Chase Mendez Date of Service: 12/26/2017 8:15 AM Medical Record Number: [id number] Patient Account Number: [id number] Date of Birth/Sex: 1935/01/03 (83 y.o. M) Treating RN: Cornell Barman Primary Care Chesnee Floren: Pernell Dupre Other  Clinician: Referring Jamarious Febo: Pernell Dupre Treating Tawni Melkonian/Extender: Tito Dine in Treatment: 4 Encounter Discharge Information Items Discharge Condition: Stable Ambulatory Status: Walker Discharge Destination: Home Transportation: Private Auto Accompanied By: wife Schedule Follow-up Appointment: Yes Clinical Summary of Care: Patient Declined Electronic Signature(s) Signed: 12/26/2017 5:21:06 PM By: Gretta Cool, BSN, RN, CWS, Kim RN, BSN Entered By: Gretta Cool, BSN, RN, CWS, Kim on 12/26/2017 08:45:15 Chase Mendez ([id number]) -------------------------------------------------------------------------------- Lower Extremity Assessment Details Patient Name: Chase Mendez Date of Service: 12/26/2017 8:15 AM Medical Record Number: [id number] Patient Account Number: [id number] Date of Birth/Sex: 1934/05/24 (83 y.o. M) Treating RN: Cornell Barman Primary Care Reynaldo Rossman: Pernell Dupre Other Clinician: Referring Vasiliki Smaldone: Pernell Dupre Treating Alric Geise/Extender: Tito Dine in Treatment: 4 Electronic Signature(s) Signed: 12/26/2017 5:21:06 PM By: Gretta Cool, BSN, RN, CWS, Kim RN, BSN Entered By: Gretta Cool, BSN, RN, CWS, Kim on 12/26/2017 08:22:08 TAYTE, MCWHERTER ([id number]) -------------------------------------------------------------------------------- Multi Wound Chart Details Patient Name: Chase Mendez Date of Service: 12/26/2017 8:15 AM Medical Record Number: [id number] Patient Account Number: [id number] Date of Birth/Sex: Dec 08, 1934 (83 y.o. M) Treating RN: Cornell Barman Primary Care Jabarri Stefanelli: Pernell Dupre Other Clinician: Referring Shantoya Geurts: Pernell Dupre Treating Mayar Whittier/Extender: Tito Dine in Treatment: 4 Vital Signs Height(in): 72 Pulse(bpm): 51 Weight(lbs): 192.7 Blood Pressure(mmHg): 139/64 Body Mass Index(BMI): 26 Temperature(F): 98.2 Respiratory Rate 16 (breaths/min): Photos: [2:No Photos] [N/A:N/A] Wound  Location: [2:Right Hand - Palm - Dorsal] [N/A:N/A] Wounding Event: [2:Trauma] [N/A:N/A] Primary Etiology: [2:Trauma, Other] [N/A:N/A] Comorbid History: [2:Chronic Obstructive Pulmonary Disease (COPD), Type II Diabetes, Neuropathy] [N/A:N/A] Date Acquired: [2:11/14/2017] [N/A:N/A] Weeks of Treatment: [2:4] [N/A:N/A] Wound Status: [2:Open] [N/A:N/A] Measurements L x W x D [2:0.1x0.1x0.1] [N/A:N/A] (cm) Area (cm) : [2:0.008] [N/A:N/A] Volume (cm) : [2:0.001] [N/A:N/A] % Reduction in Area: [2:99.40%] [N/A:N/A] % Reduction in Volume: [2:99.90%] [N/A:N/A] Classification: [2:Full Thickness Without Exposed Support Structures] [  N/A:N/A] Exudate Amount: [2:Medium] [N/A:N/A] Exudate Type: [2:Serous] [N/A:N/A] Exudate Color: [2:amber] [N/A:N/A] Wound Margin: [2:Flat and Intact] [N/A:N/A] Granulation Amount: [2:Small (1-33%)] [N/A:N/A] Granulation Quality: [2:Pink] [N/A:N/A] Necrotic Amount: [2:None Present (0%)] [N/A:N/A] Exposed Structures: [2:Fat Layer (Subcutaneous Tissue) Exposed: Yes Fascia: No Tendon: No Muscle: No Joint: No Bone: No] [N/A:N/A] Epithelialization: [2:Large (67-100%)] [N/A:N/A] Periwound Skin Texture: [2:Excoriation: No Induration: No Callus: No Crepitus: No] [N/A:N/A] Rash: No Scarring: No Periwound Skin Moisture: Maceration: No N/A N/A Dry/Scaly: No Periwound Skin Color: Atrophie Blanche: No N/A N/A Cyanosis: No Ecchymosis: No Erythema: No Hemosiderin Staining: No Mottled: No Pallor: No Rubor: No Tenderness on Palpation: Yes N/A N/A Wound Preparation: Ulcer Cleansing: N/A N/A Rinsed/Irrigated with Saline Treatment Notes Wound #2 (Right, Dorsal Hand - Palm) Notes Iodosorb on hand, coverlet on toe Electronic Signature(s) Signed: 12/26/2017 5:31:58 PM By: Linton Ham MD Entered By: Linton Ham on 12/26/2017 08:49:27 Chase Mendez ([id number]) -------------------------------------------------------------------------------- Multi-Disciplinary  Care Plan Details Patient Name: Chase Mendez Date of Service: 12/26/2017 8:15 AM Medical Record Number: [id number] Patient Account Number: [id number] Date of Birth/Sex: 07-01-1934 (83 y.o. M) Treating RN: Cornell Barman Primary Care Jaylise Peek: Pernell Dupre Other Clinician: Referring Malahki Gasaway: Pernell Dupre Treating Gene Glazebrook/Extender: Tito Dine in Treatment: 4 Active Inactive ` Abuse / Safety / Falls / Self Care Management Nursing Diagnoses: History of Falls Impaired physical mobility Potential for falls Goals: Patient will remain injury free related to falls Date Initiated: 11/28/2017 Target Resolution Date: 12/12/2017 Goal Status: Active Patient/caregiver will verbalize understanding of skin care regimen Date Initiated: 11/28/2017 Target Resolution Date: 12/12/2017 Goal Status: Active Interventions: Assess fall risk on admission and as needed Notes: ` Medication Nursing Diagnoses: Knowledge deficit related to medication safety: actual or potential Goals: Patient/caregiver will demonstrate understanding of all current medications Date Initiated: 11/28/2017 Target Resolution Date: 12/12/2017 Goal Status: Active Patient/caregiver will demonstrate understanding of new oral/IV medications prescribed at the [facility] (topical prescriptions are covered under the skin breakdown problem) Date Initiated: 11/28/2017 Target Resolution Date: 12/12/2017 Goal Status: Active Interventions: Assess for medication contraindications each visit where new medications are prescribed Notes: ` Orientation to the [facility] ELIGE, SHOUSE Cortez. ([id number]) Nursing Diagnoses: Knowledge deficit related to the wound healing center program Goals: Patient/caregiver will verbalize understanding of the [facility] Date Initiated: 11/28/2017 Target Resolution Date: 12/12/2017 Goal Status: Active Interventions: Provide education on orientation to the wound  center Notes: ` Wound/Skin Impairment Nursing Diagnoses: Impaired tissue integrity Goals: Ulcer/skin breakdown will have a volume reduction of 30% by week 4 Date Initiated: 11/28/2017 Target Resolution Date: 12/12/2017 Goal Status: Active Interventions: Assess ulceration(s) every visit Treatment Activities: Referred to DME Falisa Lamora for dressing supplies : 11/28/2017 Skin care regimen initiated : 11/28/2017 Notes: Electronic Signature(s) Signed: 12/26/2017 5:21:06 PM By: Gretta Cool, BSN, RN, CWS, Kim RN, BSN Entered By: Gretta Cool, BSN, RN, CWS, Kim on 12/26/2017 08:36:16 JETER, TOMEY ([id number]) -------------------------------------------------------------------------------- Pain Assessment Details Patient Name: Chase Mendez Date of Service: 12/26/2017 8:15 AM Medical Record Number: [id number] Patient Account Number: [id number] Date of Birth/Sex: 11-11-1934 (83 y.o. M) Treating RN: Cornell Barman Primary Care Satoru Milich: Pernell Dupre Other Clinician: Referring Liley Rake: Pernell Dupre Treating Sevanna Ballengee/Extender: Tito Dine in Treatment: 4 Active Problems Location of Pain Severity and Description of Pain Patient Has Paino No Site Locations Pain Management and Medication Current Pain Management: Electronic Signature(s) Signed: 12/26/2017 11:20:12 AM By: Lorine Bears RCP, RRT, CHT Signed: 12/26/2017 5:21:06 PM By: Gretta Cool, BSN, RN, CWS, Kim RN, BSN Entered By: Juleen China,  Rolene Arbour on 12/26/2017 08:14:48 COLLIS, THEDE ([id number]) -------------------------------------------------------------------------------- Patient/Caregiver Education Details Patient Name: CLOVIS, MANKINS Date of Service: 12/26/2017 8:15 AM Medical Record Number: [id number] Patient Account Number: [id number] Date of Birth/Gender: 10-07-1934 (83 y.o. M) Treating RN: Cornell Barman Primary Care Physician: Pernell Dupre Other Clinician: Referring Physician: Pernell Dupre Treating Physician/Extender: Tito Dine in Treatment: 4 Education Assessment Education Provided To: Patient Education Topics Provided Wound/Skin Impairment: Handouts: Caring for Your Ulcer Methods: Demonstration, Explain/Verbal Responses: State content correctly Electronic Signature(s) Signed: 12/26/2017 5:21:06 PM By: Gretta Cool, BSN, RN, CWS, Kim RN, BSN Entered By: Gretta Cool, BSN, RN, CWS, Kim on 12/26/2017 08:45:20 KADIN, CANIPE ([id number]) -------------------------------------------------------------------------------- Wound Assessment Details Patient Name: Chase Mendez Date of Service: 12/26/2017 8:15 AM Medical Record Number: [id number] Patient Account Number: [id number] Date of Birth/Sex: Feb 12, 1934 (83 y.o. M) Treating RN: Cornell Barman Primary Care Trajon Rosete: Pernell Dupre Other Clinician: Referring Clorene Nerio: Pernell Dupre Treating Taurean Ju/Extender: Tito Dine in Treatment: 4 Wound Status Wound Number: 2 Primary Trauma, Other Etiology: Wound Location: Right Hand - Palm - Dorsal Wound Open Wounding Event: Trauma Status: Date Acquired: 11/14/2017 Comorbid Chronic Obstructive Pulmonary Disease Weeks Of Treatment: 4 History: (COPD), Type II Diabetes, Neuropathy Clustered Wound: No Photos Photo Uploaded By: Secundino Ginger on 12/26/2017 16:12:11 Wound Measurements Length: (cm) 0.1 Width: (cm) 0.1 Depth: (cm) 0.1 Area: (cm) 0.008 Volume: (cm) 0.001 % Reduction in Area: 99.4% % Reduction in Volume: 99.9% Epithelialization: Large (67-100%) Tunneling: No Undermining: No Wound Description Full Thickness Without Exposed Support Classification: Structures Wound Margin: Flat and Intact Exudate Medium Amount: Exudate Type: Serous Exudate Color: amber Foul Odor After Cleansing: No Slough/Fibrino Yes Wound Bed Granulation Amount: Small (1-33%) Exposed Structure Granulation Quality: Pink Fascia Exposed: No Necrotic Amount:  None Present (0%) Fat Layer (Subcutaneous Tissue) Exposed: Yes Tendon Exposed: No Muscle Exposed: No Joint Exposed: No Bone Exposed: No OKLEY, MAGNUSSEN ([id number]) Periwound Skin Texture Texture Color No Abnormalities Noted: No No Abnormalities Noted: No Callus: No Atrophie Blanche: No Crepitus: No Cyanosis: No Excoriation: No Ecchymosis: No Induration: No Erythema: No Rash: No Hemosiderin Staining: No Scarring: No Mottled: No Pallor: No Moisture Rubor: No No Abnormalities Noted: No Dry / Scaly: No Temperature / Pain Maceration: No Tenderness on Palpation: Yes Wound Preparation Ulcer Cleansing: Rinsed/Irrigated with Saline Treatment Notes Wound #2 (Right, Dorsal Hand - Palm) Notes Iodosorb on hand, coverlet on toe Electronic Signature(s) Signed: 12/26/2017 5:21:06 PM By: Gretta Cool, BSN, RN, CWS, Kim RN, BSN Entered By: Gretta Cool, BSN, RN, CWS, Kim on 12/26/2017 08:22:01 ESSAM, LOWDERMILK ([id number]) -------------------------------------------------------------------------------- Vitals Details Patient Name: Chase Mendez Date of Service: 12/26/2017 8:15 AM Medical Record Number: [id number] Patient Account Number: [id number] Date of Birth/Sex: 1934-02-17 (83 y.o. M) Treating RN: Cornell Barman Primary Care Diontay Rosencrans: Pernell Dupre Other Clinician: Referring Fate Galanti: Pernell Dupre Treating Verenise Moulin/Extender: Tito Dine in Treatment: 4 Vital Signs Time Taken: 08:15 Temperature (F): 98.2 Height (in): 72 Pulse (bpm): 62 Weight (lbs): 192.7 Respiratory Rate (breaths/min): 16 Body Mass Index (BMI): 26.1 Blood Pressure (mmHg): 139/64 Reference Range: 80 - 120 mg / dl Electronic Signature(s) Signed: 12/26/2017 11:20:12 AM By: Lorine Bears RCP, RRT, CHT Entered By: Lorine Bears on 12/26/2017 08:17:33

## 2017-12-28 NOTE — Progress Notes (Signed)
NEKHI, LIWANAG ([id number]) Visit Report for 12/26/2017 HPI Details Patient Name: Chase Mendez, Chase Mendez Date of Service: 12/26/2017 8:15 AM Medical Record Number: [id number] Patient Account Number: [id number] Date of Birth/Sex: Feb 06, 1935 (83 y.o. M) Treating RN: Cornell Barman Primary Care Provider: Pernell Dupre Other Clinician: Referring Provider: Pernell Dupre Treating Provider/Extender: Tito Dine in Treatment: 4 History of Present Illness HPI Description: ADMISSION 11/28/17 This is an 83 year old man who had an initial fall on October 3. Apparently he suffered a laceration of the left second toe that required 5 sutures. The sutures were removed in mid October. The patient traumatized the toe again 4 days ago while walking on a carpet. He apparently has left foot drop and he catches the forefoot when he walks. This reopened the toe. He was seen in the ER yesterday. An x-ray done of the toes suggested a fracture at the lateral base of the distal phalanx that was apparently well approximated. He was also noted to have an infection in the toe/cellulitis and was started on clindamycin. With regards to his right hand he had another fall on October 9. He suffered a laceration with a flap dehiscence over the Hypo-thenar eminence just distal to the wrist. He did not get this sutured however sometime after this he saw his primary care physician who tried to use Dermabond on this but it did not adhere. He is a diabetic but is on diet alone. His metformin was stopped when his blood sugars were in the normal range and his hemoglobin A1c was in the mid fives. He has foot drop secondary to apparently back issues. He has had a prior history of L5 radiculopathy. Beside this he has chronic gastritis hypothyroidism gastroesophageal reflux disease ABIs in our clinic were 1.13 bilaterally 12/05/17; follow-up for a patient that we admitted last week who had 2 fall and laceration injuries 1 on  the hypo-thenar eminence of the right hand and the second on the left second toe. The left second toe required sutures which healed but then he retraumatized it.[name] He also had been started on clindamycin in the emergency room when we saw him last week for cellulitis of the second toe that extended into the proximal dorsal foot. I don't believe there was an actual culture. We used Iodoflex last week The area on the hypo-thenar eminence of the right hand is an interesting flap of tissue that failed Dermabond adhesion in his primary care office. Normally flaps of skin and subcutaneous tissue like this become ischemic however for some reason this is actually remaining viable. We put Iodoflex under it last week And compression to hopefully form adhesion. I had some thoughts that the flap of tissue would have to be removed and the wound would have to be healed from the bottom this in terms of promoting granulation 12/12/17; the area on the left second toe required debridement today however this looks a lot better. We've been using Iodoflex. The area on the hypo-thenar eminence is actually adhering we've been using Iodosorb. The patient had a epidermoid cysto Removed from the right lateral part of his face just below his ear. He is on doxycycline for this I did not look at this. 12/19/17; the left second toe is healed. There is nothing open here. oThe area on his hypo-thenar eminence Still is open at the tip. We've been using Iodosorb ointment 12/26/17; left second toe remains healed there is nothing open. oThe area on his hypo-thenar eminence again has no visible open area. We've  been using Iodosorb. Last week I roughed up both surfaces C here Electronic Signature(s) Signed: 12/26/2017 5:31:58 PM By: Linton Ham MD DACIAN, ORRICO ([id number]) Entered By: Linton Ham on 12/26/2017 08:50:23 SAID, RUEB  ([id number]) -------------------------------------------------------------------------------- Physical Exam Details Patient Name: Chase Mendez Date of Service: 12/26/2017 8:15 AM Medical Record Number: [id number] Patient Account Number: [id number] Date of Birth/Sex: [birth year] (83 y.o. M) Treating RN: Cornell Barman Primary Care Provider: Pernell Dupre Other Clinician: Referring Provider: Pernell Dupre Treating Provider/Extender: Tito Dine in Treatment: 4 Constitutional Sitting or standing Blood Pressure is within target range for patient.. Pulse regular and within target range for patient.[name] Respirations regular, non-labored and within target range.. Temperature is normal and within the target range for the patient.[name] appears in no distress. Notes Wound exam #1 right hypo-thenar eminence. The flap of skin and subcutaneous tissue is totally adherent today. I cannot visually see an open area. No debridement is required o#2 the area on the left second toe remains healed scar tissue where the laceration was remains closed there is some dry skin here but no open area Electronic Signature(s) Signed: 12/26/2017 5:31:58 PM By: Linton Ham MD Entered By: Linton Ham on 12/26/2017 08:51:34 Chase Mendez ([id number]) -------------------------------------------------------------------------------- Physician Orders Details Patient Name: Chase Mendez Date of Service: 12/26/2017 8:15 AM Medical Record Number: [id number] Patient Account Number: [id number] Date of Birth/Sex: [birth year] (83 y.o. M) Treating RN: Cornell Barman Primary Care Provider: Pernell Dupre Other Clinician: Referring Provider: Pernell Dupre Treating Provider/Extender: Tito Dine in Treatment: 4 Verbal / Phone Orders: No Diagnosis Coding Wound Cleansing Wound #2 Right,Dorsal Hand - Palm o Clean wound with Normal Saline. Anesthetic (add to Medication List) Wound #2  Right,Dorsal Hand - Palm o Topical Lidocaine 4% cream applied to wound bed prior to debridement (In Clinic Only). Primary Wound Dressing Wound #2 Right,Dorsal Hand - Palm o Iodoflex Secondary Dressing Wound #2 Right,Dorsal Hand - Palm o Other - Coverlet Dressing Change Frequency Wound #2 Right,Dorsal Hand - Palm o Change Dressing Monday, Wednesday, Friday Follow-up Appointments Wound #2 Right,Dorsal Hand - Palm o Return Appointment in 2 weeks. Home Health Wound #2 Right,Dorsal Hand - Elizebeth Brooking [facility] Electronic Signature(s) Signed: 12/26/2017 5:21:06 PM By: Gretta Cool, BSN, RN, CWS, Kim RN, BSN Signed: 12/26/2017 5:31:58 PM By: Linton Ham MD Entered By: Gretta Cool, BSN, RN, CWS, Kim on 12/26/2017 08:43:41 Chase Mendez, Chase Mendez ([id number]) -------------------------------------------------------------------------------- Problem List Details Patient Name: Chase Mendez, Chase Mendez Date of Service: 12/26/2017 8:15 AM Medical Record Number: [id number] Patient Account Number: [id number] Date of Birth/Sex: May 25, 1934 (83 y.o. 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 M) Treating RN: Cornell Barman Primary Care Provider: Pernell Dupre Other Clinician: Referring Provider: Pernell Dupre  Treating Provider/Extender: Ricard Dillon Weeks in Treatment: 4 Subjective History of Present Illness (HPI) ADMISSION 11/28/17 This is an 83 year old man who had an initial fall on October 3. Apparently he suffered a laceration of the left second toe that required 5 sutures. The sutures were removed in mid October. The patient traumatized the toe again 4 days ago while walking on a carpet. He apparently has left foot drop and he catches the forefoot when he walks. This reopened the toe. He was seen in the ER yesterday. An x-ray done of the toes suggested a fracture at the lateral base of the distal phalanx that was apparently well approximated. He was also noted to have an infection in the toe/cellulitis and was started on clindamycin. With regards to his right hand he had another fall on October 9. He suffered a laceration with a flap dehiscence over the Hypo-thenar eminence just distal to the wrist. He did not get this sutured however sometime after this he saw his primary care physician who tried to use Dermabond on this but it did not adhere. He is a diabetic but is on diet alone. His metformin was stopped when his blood sugars were in the normal range and his hemoglobin A1c was in the mid fives. He has foot drop secondary to apparently back issues. He has had a prior history of L5 radiculopathy. Beside this he has chronic gastritis hypothyroidism gastroesophageal reflux disease ABIs in our clinic were 1.13 bilaterally 12/05/17; follow-up for a patient that we admitted last  week who had 2 fall and laceration injuries 1 on the hypo-thenar eminence of the right hand and the second on the left second toe. The left second toe required sutures which healed but then he retraumatized it.[name] He also had been started on clindamycin in the emergency room when we saw him last week for cellulitis of the second toe that extended into the proximal dorsal foot. I don't believe there was an actual culture. We used Iodoflex last week The area on the hypo-thenar eminence of the right hand is an interesting flap of tissue that failed Dermabond adhesion in his primary care office. Normally flaps of skin and subcutaneous tissue like this become ischemic however for some reason this is actually remaining viable. We put Iodoflex under it last week And compression to hopefully form adhesion. I had some thoughts that the flap of tissue would have to be removed and the wound would have to be healed from the bottom this in terms of promoting granulation 12/12/17; the area on the left second toe required debridement today however this looks a lot better. We've been using Iodoflex. The area on the hypo-thenar eminence is actually adhering we've been using Iodosorb. The patient had a epidermoid cysto Removed from the right lateral part of his face just below his ear. He is on doxycycline for this I did not look at this. 12/19/17; the left second toe is healed. There is nothing open here. The area on his hypo-thenar eminence Still is open at the tip. We've been using Iodosorb ointment 12/26/17; left second toe remains healed there is nothing open. The area on his hypo-thenar eminence again has no visible open area. We've been using Iodosorb. Last week I roughed up both surfaces C here Chase Mendez, Chase Mendez. ([id number]) Objective Constitutional Sitting or standing Blood Pressure is within target range for patient.. Pulse regular and within target range for patient.[name] Respirations regular, non-labored and  within target range.. Temperature is normal and within the  target range for the patient.[name] appears in no distress. Vitals Time Taken: 8:15 AM, Height: 72 in, Weight: 192.7 lbs, BMI: 26.1, Temperature: 98.2 F, Pulse: 62 bpm, Respiratory Rate: 16 breaths/min, Blood Pressure: 139/64 mmHg. General Notes: Wound exam #1 right hypo-thenar eminence. The flap of skin and subcutaneous tissue is totally adherent today. I cannot visually see an open area. No debridement is required #2 the area on the left second toe remains healed scar tissue where the laceration was remains closed there is some dry skin here but no open area Integumentary (Hair, Skin) Wound #2 status is Open. Original cause of wound was Trauma. The wound is located on the Right,Dorsal Hand - Palm. The wound measures 0.1cm length x 0.1cm width x 0.1cm depth; 0.008cm^2 area and 0.001cm^3 volume. There is Fat Layer (Subcutaneous Tissue) Exposed exposed. There is no tunneling or undermining noted. There is a medium amount of serous drainage noted. The wound margin is flat and intact. There is small (1-33%) pink granulation within the wound bed. There is no necrotic tissue within the wound bed. The periwound skin appearance did not exhibit: Callus, Crepitus, Excoriation, Induration, Rash, Scarring, Dry/Scaly, Maceration, Atrophie Blanche, Cyanosis, Ecchymosis, Hemosiderin Staining, Mottled, Pallor, Rubor, Erythema. The periwound has tenderness on palpation. Assessment Active Problems ICD-10 Unspecified open wound of right hand, subsequent encounter Type 2 diabetes mellitus with foot ulcer Non-pressure chronic ulcer of other part of left foot with necrosis of bone Cellulitis of left lower limb Plan Wound Cleansing: Wound #2 Right,Dorsal Hand - Palm: Clean wound with Normal Saline. Anesthetic (add to Medication List): Wound #2 Right,Dorsal Hand - Palm: Topical Lidocaine 4% cream applied to wound bed prior to debridement (In Clinic  Only). Primary Wound Dressing: Wound #2 Right,Dorsal Hand - Palm: Iodoflex Secondary Dressing: Chase Mendez, Chase Mendez ([id number]) Wound #2 Right,Dorsal Hand - Palm: Other - Coverlet Dressing Change Frequency: Wound #2 Right,Dorsal Hand - Palm: Change Dressing Monday, Wednesday, Friday Follow-up Appointments: Wound #2 Right,Dorsal Hand - Palm: Return Appointment in 2 weeks. Home Health: Wound #2 Right,Dorsal Hand - Palm: D/C Home Health Services 1 Left second toe remains closed #2 of asked him to continue the eye was or appointment to the threatened superior area of the area on his right hand however I can't see an open area. It is truly amazing that this actually remained viable #3 I'm hopeful with be able to discharge in 2 weeks Electronic Signature(s) Signed: 12/26/2017 5:31:58 PM By: Linton Ham MD Entered By: Linton Ham on 12/26/2017 08:54:57 Chase Mendez, Chase Mendez ([id number]) -------------------------------------------------------------------------------- [place] Details Patient Name: Chase Mendez Date of Service: 12/26/2017 Medical Record Number: [id number] Patient Account Number: [id number] Date of Birth/Sex: 13-Sep-1934 (83 y.o. M) Treating RN: Cornell Barman Primary Care Provider: Pernell Dupre Other Clinician: Referring Provider: Pernell Dupre Treating Provider/Extender: Tito Dine in Treatment: 4 Diagnosis Coding ICD-10 Codes Code Description S61.401D Unspecified open wound of right hand, subsequent encounter E11.621 Type 2 diabetes mellitus with foot ulcer L97.524 Non-pressure chronic ulcer of other part of left foot with necrosis of bone L03.116 Cellulitis of left lower limb Facility Procedures CPT4 Code: [id number] Description: [phone] - WOUND CARE VISIT-LEV 2 EST PT Modifier: Quantity: 1 Physician Procedures CPT4 Code: [id number] Description: 09233 - WC PHYS LEVEL 2 - EST PT ICD-10 Diagnosis Description S61.401D Unspecified open wound of  right hand, subsequent encounter L97.524 Non-pressure chronic ulcer of other part of left foot with ne Modifier: crosis of bone Quantity: 1 Electronic Signature(s) Signed: 12/26/2017 5:31:58 PM By: Linton Ham MD  Entered By: Linton Ham on 12/26/2017 08:55:16

## 2018-01-01 ENCOUNTER — Telehealth: Payer: Self-pay | Admitting: *Deleted

## 2018-01-01 NOTE — Telephone Encounter (Signed)
Dr. Merilyn Baba ordered and drew the CBC .Per patient's wife, their office is supposed to fax the results to Dr. Dossie Arbour to clear him for SCS trial.

## 2018-01-07 NOTE — Telephone Encounter (Signed)
CBC results placed on Dr Adalberto Cole desk.

## 2018-01-08 ENCOUNTER — Other Ambulatory Visit: Payer: Self-pay

## 2018-01-09 ENCOUNTER — Encounter: Payer: Medicare Other | Attending: Internal Medicine | Admitting: Internal Medicine

## 2018-01-09 DIAGNOSIS — E876 Hypokalemia: Secondary | ICD-10-CM | POA: Diagnosis not present

## 2018-01-09 DIAGNOSIS — I10 Essential (primary) hypertension: Secondary | ICD-10-CM | POA: Diagnosis not present

## 2018-01-09 DIAGNOSIS — E039 Hypothyroidism, unspecified: Secondary | ICD-10-CM | POA: Insufficient documentation

## 2018-01-09 DIAGNOSIS — E11621 Type 2 diabetes mellitus with foot ulcer: Secondary | ICD-10-CM | POA: Diagnosis present

## 2018-01-09 DIAGNOSIS — K219 Gastro-esophageal reflux disease without esophagitis: Secondary | ICD-10-CM | POA: Diagnosis not present

## 2018-01-09 DIAGNOSIS — Z79899 Other long term (current) drug therapy: Secondary | ICD-10-CM | POA: Insufficient documentation

## 2018-01-09 DIAGNOSIS — L03116 Cellulitis of left lower limb: Secondary | ICD-10-CM | POA: Insufficient documentation

## 2018-01-09 DIAGNOSIS — L97524 Non-pressure chronic ulcer of other part of left foot with necrosis of bone: Secondary | ICD-10-CM | POA: Diagnosis not present

## 2018-01-12 NOTE — Progress Notes (Signed)
CAMDYN, BESKE ([id number]) Visit Report for 01/09/2018 Arrival Information Details Patient Name: Chase Mendez, Chase Mendez Date of Service: 01/09/2018 8:15 AM Medical Record Number: [id number] Patient Account Number: [id number] Date of Birth/Sex: 1934-10-10 (83 y.o. M) Treating RN: Cornell Barman Primary Care Demarquis Osley: Pernell Dupre Other Clinician: Referring Elyas Villamor: Pernell Dupre Treating Tristin Vandeusen/Extender: Tito Dine in Treatment: 6 Visit Information History Since Last Visit Added or deleted any medications: No Patient Arrived: Walker Any new allergies or adverse reactions: No Arrival Time: 08:15 Had a fall or experienced change in No Accompanied By: wife activities of daily living that may affect Transfer Assistance: None risk of falls: Patient Identification Verified: Yes Signs or symptoms of abuse/neglect since last visito No Secondary Verification Process Completed: Yes Hospitalized since last visit: No Implantable device outside of the clinic excluding No cellular tissue based products placed in the center since last visit: Has Dressing in Place as Prescribed: Yes Pain Present Now: No Electronic Signature(s) Signed: 01/09/2018 3:56:23 PM By: Lorine Bears RCP, RRT, CHT Entered By: Lorine Bears on 01/09/2018 08:16:30 Chase Mendez ([id number]) -------------------------------------------------------------------------------- Clinic Level of Care Assessment Details Patient Name: Chase Mendez Date of Service: 01/09/2018 8:15 AM Medical Record Number: [id number] Patient Account Number: [id number] Date of Birth/Sex: Jul 27, 1934 (83 y.o. M) Treating RN: Cornell Barman Primary Care Maimuna Leaman: Pernell Dupre Other Clinician: Referring Avangelina Flight: Pernell Dupre Treating Adelaide Pfefferkorn/Extender: Tito Dine in Treatment: 6 Clinic Level of Care Assessment Items TOOL 4 Quantity Score []  - Use when only an EandM is performed on  FOLLOW-UP visit 0 ASSESSMENTS - Nursing Assessment / Reassessment []  - Reassessment of Co-morbidities (includes updates in patient status) 0 X- 1 5 Reassessment of Adherence to Treatment Plan ASSESSMENTS - Wound and Skin Assessment / Reassessment X - Simple Wound Assessment / Reassessment - one wound 1 5 []  - 0 Complex Wound Assessment / Reassessment - multiple wounds []  - 0 Dermatologic / Skin Assessment (not related to wound area) ASSESSMENTS - Focused Assessment []  - Circumferential Edema Measurements - multi extremities 0 []  - 0 Nutritional Assessment / Counseling / Intervention []  - 0 Lower Extremity Assessment (monofilament, tuning fork, pulses) []  - 0 Peripheral Arterial Disease Assessment (using hand held doppler) ASSESSMENTS - Ostomy and/or Continence Assessment and Care []  - Incontinence Assessment and Management 0 []  - 0 Ostomy Care Assessment and Management (repouching, etc.) PROCESS - Coordination of Care X - Simple Patient / Family Education for ongoing care 1 15 []  - 0 Complex (extensive) Patient / Family Education for ongoing care []  - 0 Staff obtains Programmer, systems, Records, Test Results / Process Orders []  - 0 Staff telephones HHA, Nursing Homes / Clarify orders / etc []  - 0 Routine Transfer to another Facility (non-emergent condition) []  - 0 Routine Hospital Admission (non-emergent condition) []  - 0 New Admissions / Biomedical engineer / Ordering NPWT, Apligraf, etc. []  - 0 Emergency Hospital Admission (emergent condition) X- 1 10 Simple Discharge Coordination JAMIEN, CASANOVA. ([id number]) []  - 0 Complex (extensive) Discharge Coordination PROCESS - Special Needs []  - Pediatric / Minor Patient Management 0 []  - 0 Isolation Patient Management []  - 0 Hearing / Language / Visual special needs []  - 0 Assessment of Community assistance (transportation, D/C planning, etc.) []  - 0 Additional assistance / Altered mentation []  - 0 Support Surface(s)  Assessment (bed, cushion, seat, etc.) INTERVENTIONS - Wound Cleansing / Measurement X - Simple Wound Cleansing - one wound 1 5 []  - 0 Complex Wound Cleansing - multiple wounds X-  1 5 Wound Imaging (photographs - any number of wounds) []  - 0 Wound Tracing (instead of photographs) X- 1 5 Simple Wound Measurement - one wound []  - 0 Complex Wound Measurement - multiple wounds INTERVENTIONS - Wound Dressings []  - Small Wound Dressing one or multiple wounds 0 []  - 0 Medium Wound Dressing one or multiple wounds []  - 0 Large Wound Dressing one or multiple wounds []  - 0 Application of Medications - topical []  - 0 Application of Medications - injection INTERVENTIONS - Miscellaneous []  - External ear exam 0 []  - 0 Specimen Collection (cultures, biopsies, blood, body fluids, etc.) []  - 0 Specimen(s) / Culture(s) sent or taken to Lab for analysis []  - 0 Patient Transfer (multiple staff / Civil Service fast streamer / Similar devices) []  - 0 Simple Staple / Suture removal (25 or less) []  - 0 Complex Staple / Suture removal (26 or more) []  - 0 Hypo / Hyperglycemic Management (close monitor of Blood Glucose) []  - 0 Ankle / Brachial Index (ABI) - do not check if billed separately X- 1 5 Vital Signs ARIS, EVEN ([id number]) Has the patient been seen at the hospital within the last three years: Yes Total Score: 55 Level Of Care: New/Established - Level 2 Electronic Signature(s) Signed: 01/09/2018 6:02:29 PM By: Gretta Cool, BSN, RN, CWS, Kim RN, BSN Entered By: Gretta Cool, BSN, RN, CWS, Kim on 01/09/2018 08:35:05 MARRIO, SCRIBNER ([id number]) -------------------------------------------------------------------------------- Encounter Discharge Information Details Patient Name: Chase Mendez Date of Service: 01/09/2018 8:15 AM Medical Record Number: [id number] Patient Account Number: [id number] Date of Birth/Sex: April 24, 1934 (83 y.o. M) Treating RN: Cornell Barman Primary Care Nica Friske: Pernell Dupre Other  Clinician: Referring Creg Gilmer: Pernell Dupre Treating Zamyah Wiesman/Extender: Tito Dine in Treatment: 6 Encounter Discharge Information Items Discharge Condition: Stable Ambulatory Status: Walker Discharge Destination: Home Transportation: Private Auto Accompanied By: wife Schedule Follow-up Appointment: Yes Clinical Summary of Care: Electronic Signature(s) Signed: 01/09/2018 6:02:29 PM By: Gretta Cool, BSN, RN, CWS, Kim RN, BSN Entered By: Gretta Cool, BSN, RN, CWS, Kim on 01/09/2018 08:35:58 Chase Mendez ([id number]) -------------------------------------------------------------------------------- Lower Extremity Assessment Details Patient Name: Chase Mendez Date of Service: 01/09/2018 8:15 AM Medical Record Number: [id number] Patient Account Number: [id number] Date of Birth/Sex: 05-24-1934 (83 y.o. M) Treating RN: Montey Hora Primary Care Emmert Roethler: Pernell Dupre Other Clinician: Referring Kaytelyn Glore: Pernell Dupre Treating Efstathios Sawin/Extender: Tito Dine in Treatment: 6 Electronic Signature(s) Signed: 01/09/2018 5:09:45 PM By: Montey Hora Entered By: Montey Hora on 01/09/2018 08:21:42 ORION, MOLE ([id number]) -------------------------------------------------------------------------------- Multi Wound Chart Details Patient Name: Chase Mendez Date of Service: 01/09/2018 8:15 AM Medical Record Number: [id number] Patient Account Number: [id number] Date of Birth/Sex: Feb 15, 1934 (83 y.o. M) Treating RN: Cornell Barman Primary Care Emyah Roznowski: Pernell Dupre Other Clinician: Referring Tyriek Hofman: Pernell Dupre Treating Aquan Kope/Extender: Tito Dine in Treatment: 6 Vital Signs Height(in): 72 Pulse(bpm): 21 Weight(lbs): 192.7 Blood Pressure(mmHg): 147/92 Body Mass Index(BMI): 26 Temperature(F): 98.1 Respiratory Rate 16 (breaths/min): Photos: [2:No Photos] [N/A:N/A] Wound Location: [2:Right, Dorsal Hand - Palm]  [N/A:N/A] Wounding Event: [2:Trauma] [N/A:N/A] Primary Etiology: [2:Trauma, Other] [N/A:N/A] Comorbid History: [2:Chronic Obstructive Pulmonary Disease (COPD), Type II Diabetes, Neuropathy] [N/A:N/A] Date Acquired: [2:11/14/2017] [N/A:N/A] Weeks of Treatment: [2:6] [N/A:N/A] Wound Status: [2:Healed - Epithelialized] [N/A:N/A] Measurements L x W x D [2:0x0x0] [N/A:N/A] (cm) Area (cm) : [2:0] [N/A:N/A] Volume (cm) : [2:0] [N/A:N/A] % Reduction in Area: [2:100.00%] [N/A:N/A] % Reduction in Volume: [2:100.00%] [N/A:N/A] Classification: [2:Full Thickness Without Exposed Support Structures] [N/A:N/A] Exudate Amount: [2:None Present] [N/A:N/A] Wound Margin: [2:Flat  and Intact] [N/A:N/A] Granulation Amount: [2:None Present (0%)] [N/A:N/A] Necrotic Amount: [2:None Present (0%)] [N/A:N/A] Exposed Structures: [2:Fat Layer (Subcutaneous Tissue) Exposed: Yes Fascia: No Tendon: No Muscle: No Joint: No Bone: No] [N/A:N/A] Epithelialization: [2:Large (67-100%)] [N/A:N/A] Periwound Skin Texture: [2:Excoriation: No Induration: No Callus: No Crepitus: No Rash: No Scarring: No] [N/A:N/A] Periwound Skin Moisture: [N/A:N/A] Maceration: No Dry/Scaly: No Periwound Skin Color: Atrophie Blanche: No N/A N/A Cyanosis: No Ecchymosis: No Erythema: No Hemosiderin Staining: No Mottled: No Pallor: No Rubor: No Tenderness on Palpation: Yes N/A N/A Wound Preparation: Ulcer Cleansing: N/A N/A Rinsed/Irrigated with Saline Topical Anesthetic Applied: None Treatment Notes Electronic Signature(s) Signed: 01/09/2018 4:50:38 PM By: Linton Ham MD Entered By: Linton Ham on 01/09/2018 08:38:46 Chase Mendez ([id number]) -------------------------------------------------------------------------------- Multi-Disciplinary Care Plan Details Patient Name: Chase Mendez Date of Service: 01/09/2018 8:15 AM Medical Record Number: [id number] Patient Account Number: [id number] Date of Birth/Sex: 1934-08-11  (83 y.o. M) Treating RN: Cornell Barman Primary Care Kaipo Ardis: Pernell Dupre Other Clinician: Referring Christalynn Boise: Pernell Dupre Treating Albena Comes/Extender: Tito Dine in Treatment: 6 Active Inactive Electronic Signature(s) Signed: 01/09/2018 6:02:29 PM By: Gretta Cool, BSN, RN, CWS, Kim RN, BSN Entered By: Gretta Cool, BSN, RN, CWS, Kim on 01/09/2018 08:33:46 WILMER, SANTILLO ([id number]) -------------------------------------------------------------------------------- Pain Assessment Details Patient Name: Chase Mendez Date of Service: 01/09/2018 8:15 AM Medical Record Number: [id number] Patient Account Number: [id number] Date of Birth/Sex: 1934/05/19 (83 y.o. M) Treating RN: Cornell Barman Primary Care Junita Kubota: Pernell Dupre Other Clinician: Referring Bilaal Leib: Pernell Dupre Treating Ryane Canavan/Extender: Tito Dine in Treatment: 6 Active Problems Location of Pain Severity and Description of Pain Patient Has Paino No Site Locations Pain Management and Medication Current Pain Management: Electronic Signature(s) Signed: 01/09/2018 3:56:23 PM By: Lorine Bears RCP, RRT, CHT Signed: 01/09/2018 6:02:29 PM By: Gretta Cool, BSN, RN, CWS, Kim RN, BSN Entered By: Lorine Bears on 01/09/2018 08:16:41 ABDUL, BEIRNE ([id number]) -------------------------------------------------------------------------------- Wound Assessment Details Patient Name: Chase Mendez Date of Service: 01/09/2018 8:15 AM Medical Record Number: [id number] Patient Account Number: [id number] Date of Birth/Sex: 1934-05-26 (83 y.o. M) Treating RN: Cornell Barman Primary Care Eugune Sine: Pernell Dupre Other Clinician: Referring Ivery Nanney: Pernell Dupre Treating Dragan Tamburrino/Extender: Tito Dine in Treatment: 6 Wound Status Wound Number: 2 Primary Trauma, Other Etiology: Wound Location: Right, Dorsal Hand - Palm Wound Healed - Epithelialized Wounding Event:  Trauma Status: Date Acquired: 11/14/2017 Comorbid Chronic Obstructive Pulmonary Disease Weeks Of Treatment: 6 History: (COPD), Type II Diabetes, Neuropathy Clustered Wound: No Wound Measurements Length: (cm) 0 % Redu Width: (cm) 0 % Redu Depth: (cm) 0 Epithe Area: (cm) 0 Tunne Volume: (cm) 0 Under ction in Area: 100% ction in Volume: 100% lialization: Large (67-100%) ling: No mining: No Wound Description Full Thickness Without Exposed Support Foul O Classification: Structures Slough Wound Margin: Flat and Intact Exudate None Present Amount: dor After Cleansing: No /Fibrino No Wound Bed Granulation Amount: None Present (0%) Exposed Structure Necrotic Amount: None Present (0%) Fascia Exposed: No Fat Layer (Subcutaneous Tissue) Exposed: Yes Tendon Exposed: No Muscle Exposed: No Joint Exposed: No Bone Exposed: No Periwound Skin Texture Texture Color No Abnormalities Noted: No No Abnormalities Noted: No Callus: No Atrophie Blanche: No Crepitus: No Cyanosis: No Excoriation: No Ecchymosis: No Induration: No Erythema: No Rash: No Hemosiderin Staining: No Scarring: No Mottled: No Pallor: No Moisture Rubor: No No Abnormalities Noted: No Dry / Scaly: No Temperature / Pain Maceration: No Tenderness on Palpation: Yes OLEN, EAVES ([id number]) Wound Preparation Ulcer Cleansing: Rinsed/Irrigated with Saline  Topical Anesthetic Applied: None Electronic Signature(s) Signed: 01/09/2018 6:02:29 PM By: Gretta Cool, BSN, RN, CWS, Kim RN, BSN Entered By: Gretta Cool, BSN, RN, CWS, Kim on 01/09/2018 08:33:26 JAMARCO, ZALDIVAR ([id number]) -------------------------------------------------------------------------------- Vitals Details Patient Name: Chase Mendez Date of Service: 01/09/2018 8:15 AM Medical Record Number: [id number] Patient Account Number: [id number] Date of Birth/Sex: 1934/06/23 (83 y.o. M) Treating RN: Cornell Barman Primary Care Jasime Westergren: Pernell Dupre Other  Clinician: Referring Omauri Boeve: Pernell Dupre Treating Geanette Buonocore/Extender: Tito Dine in Treatment: 6 Vital Signs Time Taken: 08:15 Temperature (F): 98.1 Height (in): 72 Pulse (bpm): 62 Weight (lbs): 192.7 Respiratory Rate (breaths/min): 16 Body Mass Index (BMI): 26.1 Blood Pressure (mmHg): 147/92 Reference Range: 80 - 120 mg / dl Electronic Signature(s) Signed: 01/09/2018 3:56:23 PM By: Lorine Bears RCP, RRT, CHT Entered By: Lorine Bears on 01/09/2018 08:19:18

## 2018-01-12 NOTE — Progress Notes (Signed)
TC, KAPUSTA ([id number]) Visit Report for 01/09/2018 HPI Details Patient Name: Chase Mendez, Chase Mendez Date of Service: 01/09/2018 8:15 AM Medical Record Number: [id number] Patient Account Number: [id number] Date of Birth/Sex: April 07, 1934 (83 y.o. M) Treating RN: Cornell Barman Primary Care Provider: Pernell Dupre Other Clinician: Referring Provider: Pernell Dupre Treating Provider/Extender: Tito Dine in Treatment: 6 History of Present Illness HPI Description: ADMISSION 11/28/17 This is an 83 year old man who had an initial fall on October 3. Apparently he suffered a laceration of the left second toe that required 5 sutures. The sutures were removed in mid October. The patient traumatized the toe again 4 days ago while walking on a carpet. He apparently has left foot drop and he catches the forefoot when he walks. This reopened the toe. He was seen in the ER yesterday. An x-ray done of the toes suggested a fracture at the lateral base of the distal phalanx that was apparently well approximated. He was also noted to have an infection in the toe/cellulitis and was started on clindamycin. With regards to his right hand he had another fall on October 9. He suffered a laceration with a flap dehiscence over the Hypo-thenar eminence just distal to the wrist. He did not get this sutured however sometime after this he saw his primary care physician who tried to use Dermabond on this but it did not adhere. He is a diabetic but is on diet alone. His metformin was stopped when his blood sugars were in the normal range and his hemoglobin A1c was in the mid fives. He has foot drop secondary to apparently back issues. He has had a prior history of L5 radiculopathy. Beside this he has chronic gastritis hypothyroidism gastroesophageal reflux disease ABIs in our clinic were 1.13 bilaterally 12/05/17; follow-up for a patient that we admitted last week who had 2 fall and laceration injuries 1 on the  hypo-thenar eminence of the right hand and the second on the left second toe. The left second toe required sutures which healed but then he retraumatized it.[name] He also had been started on clindamycin in the emergency room when we saw him last week for cellulitis of the second toe that extended into the proximal dorsal foot. I don't believe there was an actual culture. We used Iodoflex last week The area on the hypo-thenar eminence of the right hand is an interesting flap of tissue that failed Dermabond adhesion in his primary care office. Normally flaps of skin and subcutaneous tissue like this become ischemic however for some reason this is actually remaining viable. We put Iodoflex under it last week And compression to hopefully form adhesion. I had some thoughts that the flap of tissue would have to be removed and the wound would have to be healed from the bottom this in terms of promoting granulation 12/12/17; the area on the left second toe required debridement today however this looks a lot better. We've been using Iodoflex. The area on the hypo-thenar eminence is actually adhering we've been using Iodosorb. The patient had a epidermoid cysto Removed from the right lateral part of his face just below his ear. He is on doxycycline for this I did not look at this. 12/19/17; the left second toe is healed. There is nothing open here. oThe area on his hypo-thenar eminence Still is open at the tip. We've been using Iodosorb ointment 12/26/17; left second toe remains healed there is nothing open. oThe area on his hypo-thenar eminence again has no visible open area. We've  been using Iodosorb. Last week I roughed up both surfaces here 01/09/18; left second toe remains close there is nothing open oThe area on the hypotension or eminence on the right hand is totally closed albeit with a raised area of normal skin and subcutaneous tissue CLEMENCE, STILLINGS ([id number]) Electronic Signature(s) Signed:  01/09/2018 4:50:38 PM By: Linton Ham MD Entered By: Linton Ham on 01/09/2018 08:39:52 TYELER, GOEDKEN ([id number]) -------------------------------------------------------------------------------- Physical Exam Details Patient Name: Chase Mendez Date of Service: 01/09/2018 8:15 AM Medical Record Number: [id number] Patient Account Number: [id number] Date of Birth/Sex: 1934-06-22 (83 y.o. M) Treating RN: Cornell Barman Primary Care Provider: Pernell Dupre Other Clinician: Referring Provider: Pernell Dupre Treating Provider/Extender: Tito Dine in Treatment: 6 Constitutional Patient is hypertensive.. Pulse regular and within target range for patient.[name] Respirations regular, non-labored and within target range.. Temperature is normal and within the target range for the patient.[name] appears in no distress. Notes Wound exam oRight hypokalemia or eminence. The flap of skin and subcutaneous tissue is totally adherent and closed. There is no open area here that's visible. Surrounding skin looks healthy there is no erythema and no drainage. The area that was the flap he removed in the fall extends above the level of the surrounding skin which in some cases would be a cosmetic issue but this is not one of them. oWe will look at the left second toe this is healed Electronic Signature(s) Signed: 01/09/2018 4:50:38 PM By: Linton Ham MD Entered By: Linton Ham on 01/09/2018 08:41:04 HOMER, MILLER ([id number]) -------------------------------------------------------------------------------- Physician Orders Details Patient Name: Chase Mendez Date of Service: 01/09/2018 8:15 AM Medical Record Number: [id number] Patient Account Number: [id number] Date of Birth/Sex: [birth year] (83 y.o. M) Treating RN: Cornell Barman Primary Care Provider: Pernell Dupre Other Clinician: Referring Provider: Pernell Dupre Treating Provider/Extender: Tito Dine in  Treatment: 6 Verbal / Phone Orders: No Diagnosis Coding Discharge From [facility] o Discharge from [facility] complete Electronic Signature(s) Signed: 01/09/2018 4:50:38 PM By: Linton Ham MD Signed: 01/09/2018 6:02:29 PM By: Gretta Cool, BSN, RN, CWS, Kim RN, BSN Entered By: Gretta Cool, BSN, RN, CWS, Kim on 01/09/2018 08:34:34 AHMAUD, DUTHIE ([id number]) -------------------------------------------------------------------------------- Problem List Details Patient Name: Chase Mendez Date of Service: 01/09/2018 8:15 AM Medical Record Number: [id number] Patient Account Number: [id number] Date of Birth/Sex: [birth year] (83 y.o. 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 M) Treating RN: Cornell Barman Primary Care Provider: Pernell Dupre Other Clinician: Referring Provider: Pernell Dupre Treating Provider/Extender: Tito Dine in Treatment:  6 Subjective History of Present Illness (HPI) ADMISSION 11/28/17 This is an 83 year old man who had an initial fall on October 3. Apparently he suffered a laceration of the left second  toe that required 5 sutures. The sutures were removed in mid October. The patient traumatized the toe again 4 days ago while walking on a carpet. He apparently has left foot drop and he catches the forefoot when he walks. This reopened the toe. He was seen in the ER yesterday. An x-ray done of the toes suggested a fracture at the lateral base of the distal phalanx that was apparently well approximated. He was also noted to have an infection in the toe/cellulitis and was started on clindamycin. With regards to his right hand he had another fall on October 9. He suffered a laceration with a flap dehiscence over the Hypo-thenar eminence just distal to the wrist. He did not get this sutured however sometime after this he saw his primary care physician who tried to use Dermabond on this but it did not adhere. He is a diabetic but is on diet alone. His metformin was stopped when his blood sugars were in the normal range and his hemoglobin A1c was in the mid fives. He has foot drop secondary to apparently back issues. He has had a prior history of L5 radiculopathy. Beside this he has chronic gastritis hypothyroidism gastroesophageal reflux disease ABIs in our clinic were 1.13 bilaterally 12/05/17; follow-up for a patient that we admitted last week who had 2 fall and laceration injuries 1 on the hypo-thenar eminence of the right hand and the second on the left second toe. The left second toe required sutures which healed but then he retraumatized it.[name] He also had been started on clindamycin in the emergency room when we saw him last week for cellulitis of the second toe that extended into the proximal dorsal foot. I don't believe there was an actual culture. We used Iodoflex last week The area on the hypo-thenar eminence  of the right hand is an interesting flap of tissue that failed Dermabond adhesion in his primary care office. Normally flaps of skin and subcutaneous tissue like this become ischemic however for some reason this is actually remaining viable. We put Iodoflex under it last week And compression to hopefully form adhesion. I had some thoughts that the flap of tissue would have to be removed and the wound would have to be healed from the bottom this in terms of promoting granulation 12/12/17; the area on the left second toe required debridement today however this looks a lot better. We've been using Iodoflex. The area on the hypo-thenar eminence is actually adhering we've been using Iodosorb. The patient had a epidermoid cysto Removed from the right lateral part of his face just below his ear. He is on doxycycline for this I did not look at this. 12/19/17; the left second toe is healed. There is nothing open here. The area on his hypo-thenar eminence Still is open at the tip. We've been using Iodosorb ointment 12/26/17; left second toe remains healed there is nothing open. The area on his hypo-thenar eminence again has no visible open area. We've been using Iodosorb. Last week I roughed up both surfaces here 01/09/18; left second toe remains close there is nothing open The area on the hypotension or eminence on the right hand is totally closed albeit with a raised area of normal skin and subcutaneous tissue THEODEN, MAUCH ([id number]) Objective Constitutional Patient is hypertensive.. Pulse regular and within target range for patient.[name] Respirations regular, non-labored and within target range.. Temperature is normal and within the target range for the patient.[name] appears in no distress. Vitals Time Taken: 8:15 AM,  Height: 72 in, Weight: 192.7 lbs, BMI: 26.1, Temperature: 98.1 F, Pulse: 62 bpm, Respiratory Rate: 16 breaths/min, Blood Pressure: 147/92 mmHg. General Notes: Wound exam Right hypokalemia  or eminence. The flap of skin and subcutaneous tissue is totally adherent and closed. There is no open area here that's visible. Surrounding skin looks healthy there is no erythema and no drainage. The area that was the flap he removed in the fall extends above the level of the surrounding skin which in some cases would be a cosmetic issue but this is not one of them. We will look at the left second toe this is healed Integumentary (Hair, Skin) Wound #2 status is Healed - Epithelialized. Original cause of wound was Trauma. The wound is located on the Right,Dorsal Hand - Palm. The wound measures 0cm length x 0cm width x 0cm depth; 0cm^2 area and 0cm^3 volume. There is Fat Layer (Subcutaneous Tissue) Exposed exposed. There is no tunneling or undermining noted. There is a none present amount of drainage noted. The wound margin is flat and intact. There is no granulation within the wound bed. There is no necrotic tissue within the wound bed. The periwound skin appearance did not exhibit: Callus, Crepitus, Excoriation, Induration, Rash, Scarring, Dry/Scaly, Maceration, Atrophie Blanche, Cyanosis, Ecchymosis, Hemosiderin Staining, Mottled, Pallor, Rubor, Erythema. The periwound has tenderness on palpation. Assessment Active Problems ICD-10 Unspecified open wound of right hand, subsequent encounter Type 2 diabetes mellitus with foot ulcer Non-pressure chronic ulcer of other part of left foot with necrosis of bone Cellulitis of left lower limb Diagnoses ICD-10 S61.401D: Unspecified open wound of right hand, subsequent encounter E11.621: Type 2 diabetes mellitus with foot ulcer L97.524: Non-pressure chronic ulcer of other part of left foot with necrosis of bone L03.116: Cellulitis of left lower limb Plan HOSEA, HANAWALT ([id number]) Discharge From [facility]: Discharge from [facility] complete #1 the patient to be discharged from the wound care center. #2 both of his wound  areas are closed this includes a left dorsal second toe and the right hypo-thenar eminence of his hand Electronic Signature(s) Signed: 01/09/2018 4:50:38 PM By: Linton Ham MD Entered By: Linton Ham on 01/09/2018 08:41:40 ONEILL, BAIS ([id number]) -------------------------------------------------------------------------------- SuperBill Details Patient Name: Chase Mendez Date of Service: 01/09/2018 Medical Record Number: [id number] Patient Account Number: [id number] Date of Birth/Sex: May 08, 1934 (83 y.o. M) Treating RN: Cornell Barman Primary Care Provider: Pernell Dupre Other Clinician: Referring Provider: Pernell Dupre Treating Provider/Extender: Tito Dine in Treatment: 6 Diagnosis Coding ICD-10 Codes Code Description S61.401D Unspecified open wound of right hand, subsequent encounter E11.621 Type 2 diabetes mellitus with foot ulcer L97.524 Non-pressure chronic ulcer of other part of left foot with necrosis of bone L03.116 Cellulitis of left lower limb Facility Procedures CPT4 Code: [id number] Description: [phone] - WOUND CARE VISIT-LEV 2 EST PT Modifier: Quantity: 1 Physician Procedures CPT4 Code: [id number] Description: 17915 - WC PHYS LEVEL 2 - EST PT ICD-10 Diagnosis Description S61.401D Unspecified open wound of right hand, subsequent encounter L97.524 Non-pressure chronic ulcer of other part of left foot with ne Modifier: crosis of bone Quantity: 1 Electronic Signature(s) Signed: 01/09/2018 4:50:38 PM By: Linton Ham MD Entered By: Linton Ham on 01/09/2018 08:41:59

## 2018-01-16 ENCOUNTER — Encounter: Payer: Self-pay | Admitting: Nurse Practitioner

## 2018-01-16 ENCOUNTER — Ambulatory Visit: Payer: Medicare Other | Attending: Nurse Practitioner | Admitting: Nurse Practitioner

## 2018-01-16 VITALS — BP 134/72 | HR 68 | Temp 97.6°F | Resp 16 | Ht 72.0 in | Wt 194.0 lb

## 2018-01-16 DIAGNOSIS — Z886 Allergy status to analgesic agent status: Secondary | ICD-10-CM | POA: Insufficient documentation

## 2018-01-16 DIAGNOSIS — G8929 Other chronic pain: Secondary | ICD-10-CM

## 2018-01-16 DIAGNOSIS — Z7982 Long term (current) use of aspirin: Secondary | ICD-10-CM | POA: Diagnosis not present

## 2018-01-16 DIAGNOSIS — Z9889 Other specified postprocedural states: Secondary | ICD-10-CM | POA: Insufficient documentation

## 2018-01-16 DIAGNOSIS — M21372 Foot drop, left foot: Secondary | ICD-10-CM | POA: Diagnosis not present

## 2018-01-16 DIAGNOSIS — L03113 Cellulitis of right upper limb: Secondary | ICD-10-CM | POA: Insufficient documentation

## 2018-01-16 DIAGNOSIS — F419 Anxiety disorder, unspecified: Secondary | ICD-10-CM | POA: Diagnosis not present

## 2018-01-16 DIAGNOSIS — F17290 Nicotine dependence, other tobacco product, uncomplicated: Secondary | ICD-10-CM | POA: Insufficient documentation

## 2018-01-16 DIAGNOSIS — M5136 Other intervertebral disc degeneration, lumbar region: Secondary | ICD-10-CM | POA: Diagnosis not present

## 2018-01-16 DIAGNOSIS — Z882 Allergy status to sulfonamides status: Secondary | ICD-10-CM | POA: Diagnosis not present

## 2018-01-16 DIAGNOSIS — Z79890 Hormone replacement therapy (postmenopausal): Secondary | ICD-10-CM | POA: Insufficient documentation

## 2018-01-16 DIAGNOSIS — G4700 Insomnia, unspecified: Secondary | ICD-10-CM | POA: Insufficient documentation

## 2018-01-16 DIAGNOSIS — M5417 Radiculopathy, lumbosacral region: Secondary | ICD-10-CM | POA: Diagnosis not present

## 2018-01-16 DIAGNOSIS — F329 Major depressive disorder, single episode, unspecified: Secondary | ICD-10-CM | POA: Diagnosis not present

## 2018-01-16 DIAGNOSIS — K219 Gastro-esophageal reflux disease without esophagitis: Secondary | ICD-10-CM | POA: Diagnosis not present

## 2018-01-16 DIAGNOSIS — Z8249 Family history of ischemic heart disease and other diseases of the circulatory system: Secondary | ICD-10-CM | POA: Insufficient documentation

## 2018-01-16 DIAGNOSIS — E039 Hypothyroidism, unspecified: Secondary | ICD-10-CM | POA: Diagnosis not present

## 2018-01-16 DIAGNOSIS — Z9049 Acquired absence of other specified parts of digestive tract: Secondary | ICD-10-CM | POA: Diagnosis not present

## 2018-01-16 DIAGNOSIS — G894 Chronic pain syndrome: Secondary | ICD-10-CM | POA: Diagnosis not present

## 2018-01-16 DIAGNOSIS — M79605 Pain in left leg: Secondary | ICD-10-CM | POA: Insufficient documentation

## 2018-01-16 DIAGNOSIS — E1140 Type 2 diabetes mellitus with diabetic neuropathy, unspecified: Secondary | ICD-10-CM | POA: Diagnosis not present

## 2018-01-16 DIAGNOSIS — Z833 Family history of diabetes mellitus: Secondary | ICD-10-CM | POA: Diagnosis not present

## 2018-01-16 DIAGNOSIS — Z79899 Other long term (current) drug therapy: Secondary | ICD-10-CM | POA: Insufficient documentation

## 2018-01-16 MED ORDER — OXYCODONE HCL 15 MG PO TABS: 15.0000 mg | ORAL_TABLET | Freq: Four times a day (QID) | ORAL | 0 refills | Status: DC | PRN

## 2018-01-16 NOTE — Patient Instructions (Addendum)
____________________________________________________________________________________________  Medication Rules  Purpose: To inform patients, and their family members, of our rules and regulations.  Applies to: All patients receiving prescriptions (written or electronic).  Pharmacy of record: Pharmacy where electronic prescriptions will be sent. If written prescriptions are taken to a different pharmacy, please inform the nursing staff. The pharmacy listed in the electronic medical record should be the one where you would like electronic prescriptions to be sent.  Electronic prescriptions: In compliance with the [place] Strengthen Opioid Misuse Prevention (STOP) Act of 2017 (Session Law 2017-74/H243), effective February 06, 2018, all controlled substances must be electronically prescribed. Calling prescriptions to the pharmacy will cease to exist.  Prescription refills: Only during scheduled appointments. Applies to all prescriptions.  NOTE: The following applies primarily to controlled substances (Opioid* Pain Medications).   Patient's responsibilities: 1. Pain Pills: Bring all pain pills to every appointment (except for procedure appointments). 2. Pill Bottles: Bring pills in original pharmacy bottle. Always bring the newest bottle. Bring bottle, even if empty. 3. Medication refills: You are responsible for knowing and keeping track of what medications you take and those you need refilled. The day before your appointment: write a list of all prescriptions that need to be refilled. The day of the appointment: give the list to the admitting nurse. Prescriptions will be written only during appointments. If you forget a medication: it will not be "Called in", "Faxed", or "electronically sent". You will need to get another appointment to get these prescribed. No early refills. Do not call asking to have your prescription filled early. 4. Prescription Accuracy: You are responsible for  carefully inspecting your prescriptions before leaving our office. Have the discharge nurse carefully go over each prescription with you, before taking them home. Make sure that your name is accurately spelled, that your address is correct. Check the name and dose of your medication to make sure it is accurate. Check the number of pills, and the written instructions to make sure they are clear and accurate. Make sure that you are given enough medication to last until your next medication refill appointment. 5. Taking Medication: Take medication as prescribed. When it comes to controlled substances, taking less pills or less frequently than prescribed is permitted and encouraged. Never take more pills than instructed. Never take medication more frequently than prescribed.  6. Inform other Doctors: Always inform, all of your healthcare providers, of all the medications you take. 7. Pain Medication from other Providers: You are not allowed to accept any additional pain medication from any other Doctor or Healthcare provider. There are two exceptions to this rule. (see below) In the event that you require additional pain medication, you are responsible for notifying us, as stated below. 8. Medication Agreement: You are responsible for carefully reading and following our Medication Agreement. This must be signed before receiving any prescriptions from our practice. Safely store a copy of your signed Agreement. Violations to the Agreement will result in no further prescriptions. (Additional copies of our Medication Agreement are available upon request.) 9. Laws, Rules, & Regulations: All patients are expected to follow all Federal and State Laws, Statutes, Rules, & Regulations. Ignorance of the Laws does not constitute a valid excuse. The use of any illegal substances is prohibited. 10. Adopted CDC guidelines & recommendations: Target dosing levels will be at or below 60 MME/day. Use of benzodiazepines** is not  recommended.  Exceptions: There are only two exceptions to the rule of not receiving pain medications from other Healthcare Providers. 1.   Exception #1 (Emergencies): In the event of an emergency (i.e.: accident requiring emergency care), you are allowed to receive additional pain medication. However, you are responsible for: As soon as you are able, call our office [phone], at any time of the day or night, and leave a message stating your name, the date and nature of the emergency, and the name and dose of the medication prescribed. In the event that your call is answered by a member of our staff, make sure to document and save the date, time, and the name of the person that took your information.  2. Exception #2 (Planned Surgery): In the event that you are scheduled by another doctor or dentist to have any type of surgery or procedure, you are allowed (for a period no longer than 30 days), to receive additional pain medication, for the acute post-op pain. However, in this case, you are responsible for picking up a copy of our "Post-op Pain Management for Surgeons" handout, and giving it to your surgeon or dentist. This document is available at our office, and does not require an appointment to obtain it. Simply go to our office during business hours (Monday-Thursday from 8:00 AM to 4:00 PM) (Friday 8:00 AM to 12:00 Noon) or if you have a scheduled appointment with [place], prior to your surgery, and ask for it by name. In addition, you will need to provide [place] with your name, name of your surgeon, type of surgery, and date of procedure or surgery.  *Opioid medications include: morphine, codeine, oxycodone, oxymorphone, hydrocodone, hydromorphone, meperidine, tramadol, tapentadol, buprenorphine, fentanyl, methadone. **Benzodiazepine medications include: diazepam (Valium), alprazolam (Xanax), clonazepam (Klonopine), lorazepam (Ativan), clorazepate (Tranxene), chlordiazepoxide (Librium), estazolam (Prosom),  oxazepam (Serax), temazepam (Restoril), triazolam (Halcion) (Last updated: 04/05/2017) ____________________________________________________________________________________________   Oxycodone x 2 months fill dates 01/20/18, 02/19/18 escribed to your pharmacy

## 2018-01-16 NOTE — Progress Notes (Signed)
Patient's Name: Chase Mendez  MRN: [id number]  Referring Provider: Jodi Marble, MD  DOB: 08/21/1934  PCP: Jodi Marble, MD  DOS: 01/16/2018  Note by: Vevelyn Francois NP  Service setting: Ambulatory outpatient  Specialty: Interventional Pain Management  Location: [facility] (AMB) Pain Management Facility    Patient type: Established    Primary Reason(s) for Visit: Encounter for prescription drug management. (Level of risk: moderate)  CC: Back Pain (lower, left is worse )  HPI  Chase Mendez is a 83 y.o. year old, male patient, who comes today for a medication management evaluation. He has Pneumonia; ARF (acute renal failure) ([place]); Spinal accessory neuropathy; Varicose veins of both lower extremities with pain; Venous reflux; Lower limb pain, inferior, left (L5) (Foot Drop); Chronic low back pain (Secondary Area of Pain) (Bilateral) (ML) (L>R) with sciatica (Left); Chronic lower extremity pain (Primary Area of Pain) (Left); Failed back surgical syndrome; Foot drop (Left); Lumbosacral radiculopathy at L5; Disorder of skeletal system; Pharmacologic therapy; Problems influencing health status; Opioid Drug tolerance; History of acute renal failure (12/08/2014); History of encephalopathy (12/08/2014); History of sepsis (12/08/2014); DDD (degenerative disc disease), lumbar; Chronic pain syndrome; and Cellulitis of right hand on their problem list. His primarily concern today is the Back Pain (lower, left is worse )  Pain Assessment: Location: Lower, Left Back Radiating: into the left leg into the foot  Onset: More than a month ago Duration: Chronic pain Quality: Discomfort, Shooting, Sharp, Constant Severity: 8 /10 (subjective, self-reported pain score)  Note: Reported level is compatible with observation. Clinically the patient looks like a 2/10 A 2/10 is viewed as "Mild to Moderate" and described as noticeable and distracting. Impossible to hide from other people. More frequent flare-ups. Still  possible to adapt and function close to normal. It can be very annoying and may have occasional stronger flare-ups. With discipline, patients may get used to it and adapt.       When using our objective Pain Scale, levels between 6 and 10/10 are said to belong in an emergency room, as it progressively worsens from a 6/10, described as severely limiting, requiring emergency care not usually available at an outpatient pain management facility. At a 6/10 level, communication becomes difficult and requires great effort. Assistance to reach the emergency department may be required. Facial flushing and profuse sweating along with potentially dangerous increases in heart rate and blood pressure will be evident. Effect on ADL: activities are limited  Timing: Constant Modifying factors: medications help  BP: 134/72  HR: 68  Chase Mendez was last scheduled for an appointment on 11/14/2017 for medication management. During today's appointment we reviewed Chase Mendez chronic pain status, as well as his outpatient medication regimen. He is scheduled to have a Spinal Cord Stimulator Trial tomorrow. He has had multiple falls. He has not had any recent PT. He denies any additional concerns today.   The patient  reports no history of drug use. His body mass index is 26.31 kg/m.  Further details on both, my assessment(s), as well as the proposed treatment plan, please see below.  Controlled Substance Pharmacotherapy Assessment REMS (Risk Evaluation and Mitigation Strategy)  Analgesic:Oxycodone IR 15 mg p.o. 4 times daily MME/day:61m/day PJanett Billow RN  01/16/2018 10:06 AM  Sign when Signing Visit Nursing Pain Medication Assessment:  Safety precautions to be maintained throughout the outpatient stay will include: orient to surroundings, keep bed in low position, maintain call bell within reach at all times, provide assistance with transfer  out of bed and ambulation.  Medication Inspection Compliance:  Pill count conducted under aseptic conditions, in front of the patient. Neither the pills nor the bottle was removed from the patient's sight at any time. Once count was completed pills were immediately returned to the patient in their original bottle.  Medication: Oxycodone IR Pill/Patch Count: 26 of 120 pills remain Pill/Patch Appearance: Markings consistent with prescribed medication Bottle Appearance: Standard pharmacy container. Clearly labeled. Filled Date: 63 / 15 / 2019 Last Medication intake:  Today   Pharmacokinetics: Liberation and absorption (onset of action): WNL Distribution (time to peak effect): WNL Metabolism and excretion (duration of action): WNL         Pharmacodynamics: Desired effects: Analgesia: [name] reports >50% benefit. Functional ability: Patient reports that medication allows him to accomplish basic ADLs Clinically meaningful improvement in function (CMIF): Sustained CMIF goals met Perceived effectiveness: Described as relatively effective, allowing for increase in activities of daily living (ADL) Undesirable effects: Side-effects or Adverse reactions: None reported Monitoring: [place] PMP: Online review of the past 48-monthperiod conducted. Compliant with practice rules and regulations Last UDS on record: Summary  Date Value Ref Range Status  11/14/2017 FINAL  Final    Comment:    ==================================================================== TOXASSURE SELECT 13 (MW) ==================================================================== Test                             Result       Flag       Units Drug Present and Declared for Prescription Verification   Oxycodone                      4937         EXPECTED   ng/mg creat   Oxymorphone                    1650         EXPECTED   ng/mg creat   Noroxycodone                   6664         EXPECTED   ng/mg creat   Noroxymorphone                 520          EXPECTED   ng/mg creat    Sources of  oxycodone are scheduled prescription medications.    Oxymorphone, noroxycodone, and noroxymorphone are expected    metabolites of oxycodone. Oxymorphone is also available as a    scheduled prescription medication. ==================================================================== Test                      Result    Flag   Units      Ref Range   Creatinine              139              mg/dL      >=20 ==================================================================== Declared Medications:  The flagging and interpretation on this report are based on the  following declared medications.  Unexpected results may arise from  inaccuracies in the declared medications.  **Note: The testing scope of this panel includes these medications:  Oxycodone  **Note: The testing scope of this panel does not include following  reported medications:  Allopurinol  Aspirin (Aspirin 81)  Atorvastatin  Citalopram  Cyanocobalamin  Diphenhydramine  Fluticasone  Gabapentin  Isosorbide  Levothyroxine  Linaclotide  Losartan (Losartan Potassium)  Mirabegron  Naloxone  Omeprazole  Ondansetron  Testosterone  Vitamin E ==================================================================== For clinical consultation, please call [phone]. ====================================================================    UDS interpretation: Compliant          Medication Assessment Form: Reviewed. Patient indicates being compliant with therapy Treatment compliance: Compliant Risk Assessment Profile: Aberrant behavior: See prior evaluations. None observed or detected today Comorbid factors increasing risk of overdose: See prior notes. No additional risks detected today Opioid risk tool (ORT) (Total Score): 6 Personal History of Substance Abuse (SUD-Substance use disorder):  Alcohol: Positive Male or Male  Illegal Drugs: Negative  Rx Drugs: Negative  ORT Risk Level calculation: Moderate Risk Risk of  substance use disorder (SUD): Low Opioid Risk Tool - 01/16/18 1038      Family History of Substance Abuse   Alcohol  Negative    Illegal Drugs  Negative    Rx Drugs  Negative      Personal History of Substance Abuse   Alcohol  Positive Male or Male    Illegal Drugs  Negative    Rx Drugs  Negative      Age   Age between 44-45 years   No      History of Preadolescent Sexual Abuse   History of Preadolescent Sexual Abuse  Negative or Male      Psychological Disease   Psychological Disease  Positive    ADD  Negative    OCD  Negative    Bipolar  Negative    Schizophrenia  Negative    Depression  Positive      Total Score   Opioid Risk Tool Scoring  6    Opioid Risk Interpretation  Moderate Risk      ORT Scoring interpretation table:  Score <3 = Low Risk for SUD  Score between 4-7 = Moderate Risk for SUD  Score >8 = High Risk for Opioid Abuse   Risk Mitigation Strategies:  Patient Counseling: Covered Patient-Prescriber Agreement (PPA): Present and active  Notification to other healthcare providers: Done  Pharmacologic Plan: No change in therapy, at this time.             Laboratory Chemistry  Inflammation Markers (CRP: Acute Phase) (ESR: Chronic Phase) Lab Results  Component Value Date   LATICACIDVEN 1.1 12/08/2014                         Rheumatology Markers No results found for: RF, ANA, LABURIC, URICUR, LYMEIGGIGMAB, LYMEABIGMQN, HLAB27                      Renal Function Markers Lab Results  Component Value Date   BUN 16 01/25/2017   CREATININE 0.98 01/25/2017   GFRAA >60 01/25/2017   GFRNONAA >60 01/25/2017                             Hepatic Function Markers Lab Results  Component Value Date   AST 28 08/21/2016   ALT 18 08/21/2016   ALBUMIN 4.1 08/21/2016   ALKPHOS 112 08/21/2016   LIPASE 37 08/21/2016                        Electrolytes Lab Results  Component Value Date   NA 137 01/25/2017   K 3.2 (L) 01/25/2017   CL 99 (L)  01/25/2017    CALCIUM 9.2 01/25/2017                        Neuropathy Markers Lab Results  Component Value Date   HGBA1C 6.3 (H) 12/08/2014                        CNS Tests No results found for: COLORCSF, APPEARCSF, RBCCOUNTCSF, WBCCSF, POLYSCSF, LYMPHSCSF, EOSCSF, PROTEINCSF, GLUCCSF, JCVIRUS, CSFOLI, IGGCSF                      Bone Pathology Markers No results found for: VD25OH, KG881JS3PRX, YV8592TW4, MQ2863OT7, 25OHVITD1, 25OHVITD2, 25OHVITD3, TESTOFREE, TESTOSTERONE                       Coagulation Parameters Lab Results  Component Value Date   PLT 169 01/25/2017                        Cardiovascular Markers Lab Results  Component Value Date   TROPONINI <0.03 01/25/2017   HGB 16.6 01/25/2017   HCT 47.0 01/25/2017                         CA Markers No results found for: CEA, CA125, LABCA2                      Note: Lab results reviewed.  Recent Diagnostic Imaging Results  DG C-Arm 1-60 Min-No Report Fluoroscopy was utilized by the requesting physician.  No radiographic  interpretation.   Complexity Note: Imaging results reviewed. Results shared with Mr. Deckard, using Layman's terms.                         Meds   Current Outpatient Medications:  .  allopurinol (ZYLOPRIM) 100 MG tablet, Take 100 mg by mouth daily., Disp: , Rfl: 0 .  aspirin EC 81 MG tablet, Take 81 mg by mouth daily., Disp: , Rfl:  .  atorvastatin (LIPITOR) 10 MG tablet, Take 10 mg by mouth every evening., Disp: , Rfl: 0 .  citalopram (CELEXA) 20 MG tablet, Take 20 mg by mouth daily., Disp: , Rfl:  .  Cyanocobalamin 1000 MCG/ML LIQD, Take 1,000 mg by mouth daily., Disp: , Rfl:  .  diphenhydrAMINE (BENADRYL) 25 MG tablet, Take 25 mg by mouth every 6 (six) hours as needed., Disp: , Rfl:  .  fluticasone (FLONASE) 50 MCG/ACT nasal spray, fluticasone propionate 50 mcg/act susp, Disp: , Rfl:  .  gabapentin (NEURONTIN) 400 MG capsule, Take 400 mg by mouth 3 (three) times daily., Disp: , Rfl:  .  isosorbide  dinitrate (ISORDIL) 30 MG tablet, Take 30 mg by mouth 3 (three) times daily., Disp: , Rfl:  .  levothyroxine (SYNTHROID, LEVOTHROID) 150 MCG tablet, Take 150 mcg by mouth every morning., Disp: , Rfl: 0 .  linaclotide (LINZESS) 290 MCG CAPS capsule, Take 290 mcg by mouth daily before breakfast., Disp: , Rfl:  .  losartan (COZAAR) 50 MG tablet, Take 50 mg by mouth daily., Disp: , Rfl:  .  mirabegron ER (MYRBETRIQ) 25 MG TB24 tablet, Take 25 mg by mouth daily., Disp: , Rfl:  .  naloxone (NARCAN) nasal spray 4 mg/0.1 mL, Spray into one nostril. Repeat with second device into other nostril after 2-3 minutes if no or minimal response. Use  in case of opioid overdose., Disp: 1 kit, Rfl: 0 .  omeprazole (PRILOSEC) 20 MG capsule, Take 20 mg by mouth daily., Disp: , Rfl:  .  ondansetron (ZOFRAN ODT) 4 MG disintegrating tablet, Take 1 tablet (4 mg total) by mouth every 8 (eight) hours as needed for nausea or vomiting., Disp: 20 tablet, Rfl: 0 .  [START ON 02/19/2018] oxyCODONE (ROXICODONE) 15 MG immediate release tablet, Take 1 tablet (15 mg total) by mouth every 6 (six) hours as needed for pain., Disp: 120 tablet, Rfl: 0 .  testosterone (ANDROGEL) 50 MG/5GM (1%) GEL, Place onto the skin daily., Disp: , Rfl:  .  vitamin E 400 UNIT capsule, Take 400 Units by mouth daily., Disp: , Rfl:  .  cephALEXin (KEFLEX) 500 MG capsule, Take 1 capsule (500 mg total) by mouth 3 (three) times daily for 7 days., Disp: 21 capsule, Rfl: 0 .  [START ON 01/20/2018] oxyCODONE (ROXICODONE) 15 MG immediate release tablet, Take 1 tablet (15 mg total) by mouth every 6 (six) hours as needed for pain., Disp: 120 tablet, Rfl: 0 No current facility-administered medications for this visit.   Facility-Administered Medications Ordered in Other Visits:  .  fentaNYL (SUBLIMAZE) injection 25-50 mcg, 25-50 mcg, Intravenous, Q5 min PRN, Milinda Pointer, MD, 50 mcg at 01/17/18 8850 .  midazolam (VERSED) 5 MG/5ML injection 1-2 mg, 1-2 mg,  Intravenous, Q5 min PRN, Milinda Pointer, MD, 1 mg at 01/17/18 2774  ROS  Constitutional: Denies any fever or chills Gastrointestinal: No reported hemesis, hematochezia, vomiting, or acute GI distress Musculoskeletal: Denies any acute onset joint swelling, redness, loss of ROM, or weakness Neurological: No reported episodes of acute onset apraxia, aphasia, dysarthria, agnosia, amnesia, paralysis, loss of coordination, or loss of consciousness  Allergies  Mr. Langdon is allergic to ibuprofen; sulfa antibiotics; sulfasalazine; and lidocaine.  [place]  Drug: Mr. Gum  reports no history of drug use. Alcohol:  reports no history of alcohol use. Tobacco:  reports that he has been smoking cigars. He has never used smokeless tobacco. Medical:  has a past medical history of Acute encephalopathy (12/08/2014), Anxiety, ARF (acute renal failure) ([place]) (12/08/2014), Back pain, Benign neoplasm of large bowel, Capsulitis, Chronic back pain, Depression, Diabetes mellitus without complication ([place]), Dysphagia, Exostosis, GERD (gastroesophageal reflux disease), Gout, Hypothyroidism, Insomnia, Low testosterone, Microscopic hematuria, Pneumonia (12/07/2014), Pressure ulcer (12/09/2014), Sepsis ([place]) (12/08/2014), and Thyroid disease. Surgical: Mr. Silvestro  has a past surgical history that includes Cholecystectomy; Appendectomy; Back surgery; Eye surgery; and Esophagogastroduodenoscopy (egd) with propofol (N/A, 04/24/2017). Family: family history includes Diabetes in his father; Heart disease in his mother.  Constitutional Exam  General appearance: Well nourished, well developed, and well hydrated. In no apparent acute distress Vitals:   01/16/18 0954  BP: 134/72  Pulse: 68  Resp: 16  Temp: 97.6 F (36.4 C)  TempSrc: Oral  SpO2: 96%  Weight: 194 lb (88 kg)  Height: 6' (1.829 m)  Psych/Mental status: Alert, oriented x 3 (person, place, & time)       Eyes: PERLA Respiratory: No evidence of acute respiratory  distress  Cervical Spine Area Exam  Skin & Axial Inspection: No masses, redness, edema, swelling, or associated skin lesions Alignment: Symmetrical Functional ROM: Unrestricted ROM      Stability: No instability detected Muscle Tone/Strength: Functionally intact. No obvious neuro-muscular anomalies detected. Sensory (Neurological): Unimpaired Palpation: No palpable anomalies              Upper Extremity (UE) Exam    Side: Right upper  extremity  Side: Left upper extremity  Skin & Extremity Inspection: Skin color, temperature, and hair growth are WNL. No peripheral edema or cyanosis. No masses, redness, swelling, asymmetry, or associated skin lesions. No contractures.  Skin & Extremity Inspection: Skin color, temperature, and hair growth are WNL. No peripheral edema or cyanosis. No masses, redness, swelling, asymmetry, or associated skin lesions. No contractures.  Functional ROM: Unrestricted ROM          Functional ROM: Unrestricted ROM          Muscle Tone/Strength: Functionally intact. No obvious neuro-muscular anomalies detected.  Muscle Tone/Strength: Functionally intact. No obvious neuro-muscular anomalies detected.  Sensory (Neurological): Unimpaired          Sensory (Neurological): Unimpaired          Palpation: No palpable anomalies              Palpation: No palpable anomalies                   Thoracic Spine Area Exam  Skin & Axial Inspection: No masses, redness, or swelling Alignment: Symmetrical Functional ROM: Unrestricted ROM Stability: No instability detected Muscle Tone/Strength: Functionally intact. No obvious neuro-muscular anomalies detected. Sensory (Neurological): Unimpaired Muscle strength & Tone: No palpable anomalies  Lumbar Spine Area Exam  Skin & Axial Inspection: No masses, redness, or swelling Alignment: Symmetrical Functional ROM: Unrestricted ROM       Stability: No instability detected Muscle Tone/Strength: Functionally intact. No obvious neuro-muscular  anomalies detected. Sensory (Neurological): Unimpaired Palpation: Tender       Provocative Tests: Hyperextension/rotation test: deferred today       Lumbar quadrant test (Kemp's test): deferred today       Lateral bending test: deferred today       Patrick's Maneuver: deferred today                    Gait & Posture Assessment  Ambulation: Unassisted Gait: Relatively normal for age and body habitus Posture: WNL   Lower Extremity Exam    Side: Right lower extremity  Side: Left lower extremity  Stability: No instability observed          Stability: No instability observed          Skin & Extremity Inspection: Skin color, temperature, and hair growth are WNL. No peripheral edema or cyanosis. No masses, redness, swelling, asymmetry, or associated skin lesions. No contractures.  Skin & Extremity Inspection: Skin color, temperature, and hair growth are WNL. No peripheral edema or cyanosis. No masses, redness, swelling, asymmetry, or associated skin lesions. No contractures.  Functional ROM: Unrestricted ROM                  Functional ROM: Unrestricted ROM                  Muscle Tone/Strength: Functionally intact. No obvious neuro-muscular anomalies detected.  Muscle Tone/Strength: Functionally intact. No obvious neuro-muscular anomalies detected.  Sensory (Neurological): Unimpaired        Sensory (Neurological): Dermatomal pain pattern            Palpation: No palpable anomalies  Palpation: No palpable anomalies   Assessment  Primary Diagnosis & Pertinent Problem List: The primary encounter diagnosis was Lumbosacral radiculopathy at L5. Diagnoses of Chronic lower extremity pain (Primary Area of Pain) (Left), Foot drop (Left), Lower limb pain, inferior, left (L5) (Foot Drop), and Chronic pain syndrome were also pertinent to this visit.  Status Diagnosis  Persistent  Persistent Persistent 1. Lumbosacral radiculopathy at L5   2. Chronic lower extremity pain (Primary Area of Pain) (Left)    3. Foot drop (Left)   4. Lower limb pain, inferior, left (L5) (Foot Drop)   5. Chronic pain syndrome     Problems updated and reviewed during this visit: No problems updated. Plan of Care  Pharmacotherapy (Medications Ordered): Meds ordered this encounter  Medications  . oxyCODONE (ROXICODONE) 15 MG immediate release tablet    Sig: Take 1 tablet (15 mg total) by mouth every 6 (six) hours as needed for pain.    Dispense:  120 tablet    Refill:  0    Do not place this medication, or any other prescription from our practice, on "Automatic Refill". Patient may have prescription filled one day early if pharmacy is closed on scheduled refill date.    Order Specific Question:   Supervising Provider    Answer:   Milinda Pointer [phone]  . oxyCODONE (ROXICODONE) 15 MG immediate release tablet    Sig: Take 1 tablet (15 mg total) by mouth every 6 (six) hours as needed for pain.    Dispense:  120 tablet    Refill:  0    Do not place this medication, or any other prescription from our practice, on "Automatic Refill". Patient may have prescription filled one day early if pharmacy is closed on scheduled refill date.    Order Specific Question:   Supervising Provider    Answer:   Milinda Pointer [[id number]]   New Prescriptions   CEPHALEXIN (KEFLEX) 500 MG CAPSULE    Take 1 capsule (500 mg total) by mouth 3 (three) times daily for 7 days.   Medications administered today: Evert Kohl. Zelman had no medications administered during this visit. Lab-work, procedure(s), and/or referral(s): No orders of the defined types were placed in this encounter.  Imaging and/or referral(s): None  Interventional management options: Planned, scheduled, and/or pending:   Lumbar spinal cord stimulator trial    Considering:   Diagnostic caudal epidural steroid injection + epidurogram Possible Racz procedure Diagnostic bilateral lumbar facet block Possible bilateral lumbar facet RFA Possible candidate  for intrathecal pump trial and implant Possible candidate for spinal cord stimulator trial and implant   Palliative PRN treatment(s):   Diagnostic left-sided L4 and [id number]#2    Provider-requested follow-up: Return in about 2 months (around 03/19/2018) for MedMgmt, Appointment As Scheduled.  Future Appointments  Date Time Provider [place]  01/23/2018 10:45 AM Milinda Pointer, MD [facility] None  03/13/2018 11:00 AM Vevelyn Francois, NP [facility] None   Primary Care Physician: Jodi Marble, MD Location: [facility] Outpatient Pain Management Facility Note by: Vevelyn Francois NP Date: 01/16/2018; Time: 9:48 PM  Pain Score Disclaimer: We use the NRS-11 scale. This is a self-reported, subjective measurement of pain severity with only modest accuracy. It is used primarily to identify changes within a particular patient. It must be understood that outpatient pain scales are significantly less accurate that those used for research, where they can be applied under ideal controlled circumstances with minimal exposure to variables. In reality, the score is likely to be a combination of pain intensity and pain affect, where pain affect describes the degree of emotional arousal or changes in action readiness caused by the sensory experience of pain. Factors such as social and work situation, setting, emotional state, anxiety levels, expectation, and prior pain experience may influence pain perception and show large inter-individual differences that may also be affected by time  variables.  Patient instructions provided during this appointment: Patient Instructions  ____________________________________________________________________________________________  Medication Rules  Purpose: To inform patients, and their family members, of our rules and regulations.  Applies to: All patients receiving prescriptions (written or electronic).  Pharmacy of record: Pharmacy where electronic  prescriptions will be sent. If written prescriptions are taken to a different pharmacy, please inform the nursing staff. The pharmacy listed in the electronic medical record should be the one where you would like electronic prescriptions to be sent.  Electronic prescriptions: In compliance with the [place] (STOP) Act of 2017 (Session Lanny Cramp [phone]), effective February 06, 2018, all controlled substances must be electronically prescribed. Calling prescriptions to the pharmacy will cease to exist.  Prescription refills: Only during scheduled appointments. Applies to all prescriptions.  NOTE: The following applies primarily to controlled substances (Opioid* Pain Medications).   Patient's responsibilities: 1. Pain Pills: Bring all pain pills to every appointment (except for procedure appointments). 2. Pill Bottles: Bring pills in original pharmacy bottle. Always bring the newest bottle. Bring bottle, even if empty. 3. Medication refills: You are responsible for knowing and keeping track of what medications you take and those you need refilled. The day before your appointment: write a list of all prescriptions that need to be refilled. The day of the appointment: give the list to the admitting nurse. Prescriptions will be written only during appointments. If you forget a medication: it will not be "Called in", "Faxed", or "electronically sent". You will need to get another appointment to get these prescribed. No early refills. Do not call asking to have your prescription filled early. 4. Prescription Accuracy: You are responsible for carefully inspecting your prescriptions before leaving our office. Have the discharge nurse carefully go over each prescription with you, before taking them home. Make sure that your name is accurately spelled, that your address is correct. Check the name and dose of your medication to make sure it is accurate. Check the number of  pills, and the written instructions to make sure they are clear and accurate. Make sure that you are given enough medication to last until your next medication refill appointment. 5. Taking Medication: Take medication as prescribed. When it comes to controlled substances, taking less pills or less frequently than prescribed is permitted and encouraged. Never take more pills than instructed. Never take medication more frequently than prescribed.  6. Inform other Doctors: Always inform, all of your healthcare providers, of all the medications you take. 7. Pain Medication from other Providers: You are not allowed to accept any additional pain medication from any other Doctor or Healthcare provider. There are two exceptions to this rule. (see below) In the event that you require additional pain medication, you are responsible for notifying us, as stated below. 8. Medication Agreement: You are responsible for carefully reading and following our Medication Agreement. This must be signed before receiving any prescriptions from our practice. Safely store a copy of your signed Agreement. Violations to the Agreement will result in no further prescriptions. (Additional copies of our Medication Agreement are available upon request.) 9. Laws, Rules, & Regulations: All patients are expected to follow all Federal and [employer], [employer], Rules, [employer]. Ignorance of the Laws does not constitute a valid excuse. The use of any illegal substances is prohibited. 10. Adopted CDC guidelines & recommendations: Target dosing levels will be at or below 60 MME/day. Use of benzodiazepines** is not recommended.  Exceptions: There are only two exceptions to the  rule of not receiving pain medications from other Healthcare Providers. 1. Exception #1 (Emergencies): In the event of an emergency (i.e.: accident requiring emergency care), you are allowed to receive additional pain medication. However, you are responsible for: As  soon as you are able, call our office [phone], at any time of the day or night, and leave a message stating your name, the date and nature of the emergency, and the name and dose of the medication prescribed. In the event that your call is answered by a member of our staff, make sure to document and save the date, time, and the name of the person that took your information.  2. Exception #2 (Planned Surgery): In the event that you are scheduled by another doctor or dentist to have any type of surgery or procedure, you are allowed (for a period no longer than 30 days), to receive additional pain medication, for the acute post-op pain. However, in this case, you are responsible for picking up a copy of our "Post-op Pain Management for Surgeons" handout, and giving it to your surgeon or dentist. This document is available at our office, and does not require an appointment to obtain it. Simply go to our office during business hours (Monday-Thursday from 8:00 AM to 4:00 PM) (Friday 8:00 AM to 12:00 Noon) or if you have a scheduled appointment with [place], prior to your surgery, and ask for it by name. In addition, you will need to provide [place] with your name, name of your surgeon, type of surgery, and date of procedure or surgery.  *Opioid medications include: morphine, codeine, oxycodone, oxymorphone, hydrocodone, hydromorphone, meperidine, tramadol, tapentadol, buprenorphine, fentanyl, methadone. **Benzodiazepine medications include: diazepam (Valium), alprazolam (Xanax), clonazepam (Klonopine), lorazepam (Ativan), clorazepate (Tranxene), chlordiazepoxide (Librium), estazolam (Prosom), oxazepam (Serax), temazepam (Restoril), triazolam (Halcion) (Last updated: 04/05/2017) ____________________________________________________________________________________________   Oxycodone x 2 months fill dates 01/20/18, 02/19/18 escribed to your pharmacy

## 2018-01-16 NOTE — Progress Notes (Signed)
Nursing Pain Medication Assessment:  Safety precautions to be maintained throughout the outpatient stay will include: orient to surroundings, keep bed in low position, maintain call bell within reach at all times, provide assistance with transfer out of bed and ambulation.  Medication Inspection Compliance: Pill count conducted under aseptic conditions, in front of the patient. Neither the pills nor the bottle was removed from the patient's sight at any time. Once count was completed pills were immediately returned to the patient in their original bottle.  Medication: Oxycodone IR Pill/Patch Count: 26 of 120 pills remain Pill/Patch Appearance: Markings consistent with prescribed medication Bottle Appearance: Standard pharmacy container. Clearly labeled. Filled Date: 84 / 15 / 2019 Last Medication intake:  Today

## 2018-01-17 ENCOUNTER — Encounter: Payer: Self-pay | Admitting: Pain Medicine

## 2018-01-17 ENCOUNTER — Ambulatory Visit (HOSPITAL_BASED_OUTPATIENT_CLINIC_OR_DEPARTMENT_OTHER): Payer: Medicare Other | Admitting: Pain Medicine

## 2018-01-17 ENCOUNTER — Other Ambulatory Visit: Payer: Self-pay

## 2018-01-17 ENCOUNTER — Ambulatory Visit
Admission: RE | Admit: 2018-01-17 | Discharge: 2018-01-17 | Disposition: A | Discharge status: Home / Self Care | Payer: Medicare Other | Source: Ambulatory Visit | Attending: Pain Medicine | Admitting: Pain Medicine

## 2018-01-17 VITALS — BP 132/98 | HR 62 | Temp 97.5°F | Resp 16 | Ht 72.0 in | Wt 195.0 lb

## 2018-01-17 DIAGNOSIS — G894 Chronic pain syndrome: Secondary | ICD-10-CM | POA: Diagnosis present

## 2018-01-17 DIAGNOSIS — M961 Postlaminectomy syndrome, not elsewhere classified: Secondary | ICD-10-CM

## 2018-01-17 DIAGNOSIS — M5417 Radiculopathy, lumbosacral region: Secondary | ICD-10-CM | POA: Insufficient documentation

## 2018-01-17 DIAGNOSIS — M5442 Lumbago with sciatica, left side: Secondary | ICD-10-CM | POA: Insufficient documentation

## 2018-01-17 DIAGNOSIS — M21372 Foot drop, left foot: Secondary | ICD-10-CM | POA: Diagnosis present

## 2018-01-17 DIAGNOSIS — M5136 Other intervertebral disc degeneration, lumbar region: Secondary | ICD-10-CM | POA: Diagnosis present

## 2018-01-17 DIAGNOSIS — G8929 Other chronic pain: Secondary | ICD-10-CM | POA: Insufficient documentation

## 2018-01-17 DIAGNOSIS — M79605 Pain in left leg: Secondary | ICD-10-CM | POA: Diagnosis present

## 2018-01-17 DIAGNOSIS — M51369 Other intervertebral disc degeneration, lumbar region without mention of lumbar back pain or lower extremity pain: Secondary | ICD-10-CM

## 2018-01-17 MED ORDER — ROPIVACAINE HCL 2 MG/ML IJ SOLN
INTRAMUSCULAR | Status: AC
  Filled 2018-01-17: qty 10

## 2018-01-17 MED ORDER — SODIUM CHLORIDE (PF) 0.9 % IJ SOLN
INTRAMUSCULAR | Status: AC
  Filled 2018-01-17: qty 10

## 2018-01-17 MED ORDER — ROPIVACAINE HCL 2 MG/ML IJ SOLN
20.0000 mL | Freq: Once | INTRAMUSCULAR | Status: AC
  Administered 2018-01-17: 20 mL

## 2018-01-17 MED ORDER — CEFAZOLIN SODIUM 1 G IJ SOLR
INTRAMUSCULAR | Status: AC
  Filled 2018-01-17: qty 10

## 2018-01-17 MED ORDER — FENTANYL CITRATE (PF) 100 MCG/2ML IJ SOLN
25.0000 ug | INTRAMUSCULAR | Status: DC | PRN
  Administered 2018-01-17: 50 ug via INTRAVENOUS

## 2018-01-17 MED ORDER — MIDAZOLAM HCL 5 MG/5ML IJ SOLN
INTRAMUSCULAR | Status: AC
  Filled 2018-01-17: qty 5

## 2018-01-17 MED ORDER — CEPHALEXIN 500 MG PO CAPS: 500.0000 mg | ORAL_CAPSULE | Freq: Three times a day (TID) | ORAL | 0 refills | Status: AC

## 2018-01-17 MED ORDER — FENTANYL CITRATE (PF) 100 MCG/2ML IJ SOLN
INTRAMUSCULAR | Status: AC
  Filled 2018-01-17: qty 2

## 2018-01-17 MED ORDER — MIDAZOLAM HCL 5 MG/5ML IJ SOLN
1.0000 mg | INTRAMUSCULAR | Status: DC | PRN
  Administered 2018-01-17: 1 mg via INTRAVENOUS

## 2018-01-17 MED ORDER — CEFAZOLIN SODIUM-DEXTROSE 1-4 GM/50ML-% IV SOLN
1.0000 g | Freq: Once | INTRAVENOUS | Status: AC
  Administered 2018-01-17: 1 g via INTRAVENOUS

## 2018-01-17 MED ORDER — LIDOCAINE HCL 2 % IJ SOLN
20.0000 mL | Freq: Once | INTRAMUSCULAR | Status: AC
  Administered 2018-01-17: 400 mg

## 2018-01-17 MED ORDER — LIDOCAINE HCL 2 % IJ SOLN
INTRAMUSCULAR | Status: AC
  Filled 2018-01-17: qty 20

## 2018-01-17 MED ORDER — LACTATED RINGERS IV SOLN
1000.0000 mL | Freq: Once | INTRAVENOUS | Status: AC
  Administered 2018-01-17: 1000 mL via INTRAVENOUS

## 2018-01-17 NOTE — Progress Notes (Signed)
Patient's Name: Chase Mendez  MRN: [id number]  Referring Provider: Milinda Pointer, MD  DOB: March 03, 1934  PCP: Jodi Marble, MD  DOS: 01/17/2018  Note by: Gaspar Cola, MD  Service setting: Ambulatory outpatient  Specialty: Interventional Pain Management  Patient type: Established  Location: [facility] (AMB) Pain Management Facility  Visit type: Interventional Procedure   Primary Reason for Admission: Surgical management of chronic pain condition.  Procedure:  Anesthesia, Analgesia, Anxiolysis:  Type: Trial Spinal Cord Neurostimulator Implant (Percutaneous, interlaminar, posterior epidural placement) Purpose: To determine if a permanent implant may be effective in controlling some or all of Chase Mendez chronic pain symptoms.  Region: Lumbar Level: L1-2 Laterality: Bilateral Paramedial  Type: Moderate (Conscious) Sedation combined with Local Anesthesia Indication(s): Analgesia and Anxiety Route: Intravenous (IV) IV Access: Secured Sedation: Meaningful verbal contact was maintained at all times during the procedure  Local Anesthetic: Lidocaine 1-2%   Indications: 1. Chronic pain syndrome   2. Chronic lower extremity pain (Primary Area of Pain) (Left)   3. Chronic low back pain (Secondary Area of Pain) (Bilateral) (ML) (L>R) with sciatica (Left)   4. Failed back surgical syndrome   5. Foot drop (Left)   6. Lower limb pain, inferior, left (L5) (Foot Drop)   7. Lumbosacral radiculopathy at L5    Pain Score: Pre-procedure: 8 /10 Post-procedure: 1 /10  Pre-op Assessment:  Chase Mendez is a 83 y.o. (year old), male patient, seen today for interventional treatment. He  has a past surgical history that includes Cholecystectomy; Appendectomy; Back surgery; Eye surgery; and Esophagogastroduodenoscopy (egd) with propofol (N/A, 04/24/2017).  Initial Vital Signs:  Pulse/EKG Rate: 64ECG Heart Rate: (!) 58 Temp: 98 F (36.7 C) Resp: 18 BP: (!) 141/81 SpO2: 96 %  BMI: Estimated body  mass index is 26.45 kg/m as calculated from the following:   Height as of this encounter: 6' (1.829 m).   Weight as of this encounter: 195 lb (88.5 kg).  Risk Assessment: Allergies: Reviewed. He is allergic to ibuprofen; sulfa antibiotics; sulfasalazine; and lidocaine.  Allergy Precautions: None required Coagulopathies: Reviewed. None identified.  Blood-thinner therapy: None at this time Active Infection(s): Reviewed. None identified. Chase Mendez is afebrile  Site Confirmation: Chase Mendez was asked to confirm the procedure and laterality before marking the site, which he did. Procedure checklist: Completed Consent: Before the procedure and under the influence of no sedative(s), amnesic(s), or anxiolytics, the patient was informed of the treatment options, risks and possible complications. To fulfill our ethical and legal obligations, as recommended by the American Medical Association's Code of Ethics, I have informed the patient of my clinical impression; the nature and purpose of the treatment or procedure; the risks, benefits, and possible complications of the intervention; the alternatives, including doing nothing; the risk(s) and benefit(s) of the alternative treatment(s) or procedure(s); and the risk(s) and benefit(s) of doing nothing.  Chase Mendez was provided with information about the general risks and possible complications associated with most interventional procedures. These include, but are not limited to: failure to achieve desired goals, infection, bleeding, organ or nerve damage, allergic reactions, paralysis, and/or death.  In addition, he was informed of those risks and possible complications associated to this particular procedure, which include, but are not limited to: damage to the implant; failure to decrease pain; local, systemic, or serious CNS infections, intraspinal abscess with possible cord compression and paralysis, or life-threatening such as meningitis; intrathecal  and/or epidural bleeding with formation of hematoma with possible spinal cord compression and permanent paralysis;  organ damage; nerve injury or damage with subsequent sensory, motor, and/or autonomic system dysfunction, resulting in transient or permanent pain, numbness, and/or weakness of one or several areas of the body; allergic reactions, either minor or major life-threatening, such as anaphylactic or anaphylactoid reactions.  Furthermore, Chase Mendez was informed of those risks and complications associated with the medications. These include, but are not limited to: allergic reactions (i.e.: anaphylactic or anaphylactoid reactions); arrhythmia;  Hypotension/hypertension; cardiovascular collapse; respiratory depression and/or shortness of breath; swelling or edema; medication-induced neural toxicity; particulate matter embolism and blood vessel occlusion with resultant organ, and/or nervous system infarction and permanent paralysis.  Finally, he was informed that Medicine is not an exact science; therefore, there is also the possibility of unforeseen or unpredictable risks and/or possible complications that may result in a catastrophic outcome. The patient indicated having understood very clearly. We have given the patient no guarantees and we have made no promises. Enough time was given to the patient to ask questions, all of which were answered to the patient's satisfaction. Chase Mendez has indicated that he wanted to continue with the procedure. Attestation: I, the ordering provider, attest that I have discussed with the patient the benefits, risks, side-effects, alternatives, likelihood of achieving goals, and potential problems during recovery for the procedure that I have provided informed consent. Date  Time: 01/17/2018  7:51 AM  Pre-Procedure Preparation:  Monitoring: As per clinic protocol. Respiration, ETCO2, SpO2, BP, heart rate and rhythm monitor placed and checked for adequate  function Safety Precautions: Patient was assessed for positional comfort and pressure points before starting the procedure. Time-out: I initiated and conducted the "Time-out" before starting the procedure, as per protocol. The patient was asked to participate by confirming the accuracy of the "Time Out" information. Verification of the correct person, site, and procedure were performed and confirmed by me, the nursing staff, and the patient. "Time-out" conducted as per Joint Commission's Universal Protocol (UP.01.01.01). Time: 0841  Description of Procedure Process:   Position: Prone Target Area: Posterior epidural space Approach: Posterior percutaneous, paramedial, interlaminar approach Area Prepped: Bilateral thoraco-lumbar Region Prepping solution: ChloraPrep (2% chlorhexidine gluconate and 70% isopropyl alcohol) Safety Precautions: Safe injection practices and needle disposal techniques used. Medications properly checked for expiration dates. SDV (single dose vial) medications used. Aspiration looking for blood return and/or CSF was conducted prior to all injections. At no point did I inject any substances, as a needle was being advanced. No attempts were made at seeking any paresthesias.  Description of the Procedure: Availability of a responsible, adult driver, and NPO status confirmed. Informed consent was obtained after having discussed risks and possible complications. An IV was started. The patient was then taken to the fluoroscopy suite, where the patient was placed in position for the procedure, over the fluoroscopy table. The patient was then monitored in the usual manner. Fluoroscopy was manipulated to obtain the best possible view of the target. Parallex error was corrected before commencing the procedure. Once a clear view of the target had been obtained, the skin and deeper tissues over the procedure site were infiltrated using lidocaine, loaded in a 10 cc luer-loc syringe with a 0.5  inch, 25-G needle. The introducer needle(s) was/were then inserted through the skin and deeper tissues. A paramidline approach was used to enter the posterior epidural space at a 30 angle, using "Loss-of-resistance Technique" with 3 ml of PF-NaCl (0.9% NSS). Correct needle placement was confirmed in the antero-posterior and lateral fluoroscopic views. The lead was gently introduced and  manipulated under real-time fluoroscopy, constantly assessing for pain, discomfort, or paresthesias, until the tip rested at the desired level.  I was unable to place the right side electrode secondary to obstructions to the introduction of the electrode at the level of T12. Electrode placement was tested until appropriate coverage was attained. Once the patient confirmed that the stimulation was over the desired area, the lead(s) was/were secured in place and the introducer needles removed. This was done under real-time fluoroscopy while observing the electrode tip to avoid unintended migration. The area was covered with a non-occlusive dressing and the patient transported to recovery for further programming.  Vitals:   01/17/18 0945 01/17/18 0955 01/17/18 1005 01/17/18 1015  BP: (!) 158/65 (!) 141/78 136/67 (!) 132/98  Pulse:      Resp: 12 17 19 16   Temp: 97.8 F (36.6 C)   (!) 97.5 F (36.4 C)  SpO2: 97% 96% 95% 97%  Weight:      Height:       Start Time: 0841 hrs. End Time: 0933 hrs.  Neurostimulator Details:   Lead(s):  Brand: Medtronic Epidural Access Level:  T12-L1 T12-L1  Lead implant:  Left   No. of Electrodes/Lead:  8           Laterality:  Left           Top electrode location:  T8         Bottom electrode location:  T10         MRI compatibility:  Yes           External Neurostimulator     Imaging Guidance (Spinal):          Type of Imaging Technique: Fluoroscopy Guidance (Spinal) Indication(s): Assistance in needle guidance and placement for procedures requiring needle placement in or  near specific anatomical locations not easily accessible without such assistance. Exposure Time: Please see nurses notes. Contrast: None used. Fluoroscopic Guidance: I was personally present during the use of fluoroscopy. "Tunnel Vision Technique" used to obtain the best possible view of the target area. Parallax error corrected before commencing the procedure. "Direction-depth-direction" technique used to introduce the needle under continuous pulsed fluoroscopy. Once target was reached, antero-posterior, oblique, and lateral fluoroscopic projection used confirm needle placement in all planes. Images permanently stored in EMR.          Interpretation: No contrast injected. I personally interpreted the imaging intraoperatively. Adequate needle placement confirmed in multiple planes. Permanent images saved into the patient's record.  Antibiotic Prophylaxis:   Anti-infectives (From admission, onward)   Start     Dose/Rate Route Frequency Ordered Stop   01/17/18 0830  ceFAZolin (ANCEF) IVPB 1 g/50 mL premix     1 g 100 mL/hr over 30 Minutes Intravenous  Once 01/17/18 0821 01/17/18 0928   01/17/18 0000  cephALEXin (KEFLEX) 500 MG capsule    Note to Pharmacy:  Do not place medication on "Automatic Refill".   500 mg Oral 3 times daily 01/17/18 0814 01/24/18 2359     Indication(s): None identified  Post-operative Assessment:  Post-procedure Vital Signs:  Pulse/HCG Rate: 6260 Temp: (!) 97.5 F (36.4 C) Resp: 16 BP: (!) 132/98 SpO2: 97 %  Complications: No immediate post-treatment complications observed by team, or reported by patient.  Note: The patient tolerated the entire procedure well. A repeat set of vitals were taken after the procedure and the patient was kept under observation following institutional policy, for this type of procedure. Post-procedural neurological assessment was performed, showing  return to baseline, prior to discharge. The patient was provided with post-procedure  discharge instructions, including a section on how to identify potential problems. Should any problems arise concerning this procedure, the patient was given instructions to immediately contact us, at any time, without hesitation. In any case, we plan to contact the patient by telephone for a follow-up status report regarding this interventional procedure.  Comments:  No additional relevant information.  Plan of Care   Imaging Orders     DG C-Arm 1-60 Min-No Report Procedure Orders    No procedure(s) ordered today    Medications ordered for procedure: Meds ordered this encounter  Medications  . lactated ringers infusion 1,000 mL  . ceFAZolin (ANCEF) IVPB 1 g/50 mL premix    Order Specific Question:   Antibiotic Indication:    Answer:   Surgical Prophylaxis    Order Specific Question:   Other Indication:    Answer:   Surgical Prophylaxis  . DISCONTD: midazolam (VERSED) 5 MG/5ML injection 1-2 mg    Make sure Flumazenil is available in the pyxis when using this medication. If oversedation occurs, administer 0.2 mg IV over 15 sec. If after 45 sec no response, administer 0.2 mg again over 1 min; may repeat at 1 min intervals; not to exceed 4 doses (1 mg)  . DISCONTD: fentaNYL (SUBLIMAZE) injection 25-50 mcg    Make sure Narcan is available in the pyxis when using this medication. In the event of respiratory depression (RR< 8/min): Titrate NARCAN (naloxone) in increments of 0.1 to 0.2 mg IV at 2-3 minute intervals, until desired degree of reversal.  . lidocaine (XYLOCAINE) 2 % (with pres) injection 400 mg  . ropivacaine (PF) 2 mg/mL (0.2%) (NAROPIN) injection 20 mL  . cephALEXin (KEFLEX) 500 MG capsule    Sig: Take 1 capsule (500 mg total) by mouth 3 (three) times daily for 7 days.    Dispense:  21 capsule    Refill:  0    Do not place medication on "Automatic Refill".   Medications administered: We administered lactated ringers, ceFAZolin, midazolam, fentaNYL, lidocaine, and ropivacaine  (PF) 2 mg/mL (0.2%).  See the medical record for exact dosing, route, and time of administration.  Disposition: Discharge home  Discharge Date & Time: 01/17/2018; 1017 hrs.   Physician-requested Follow-up: Return in about 1 week (around 01/24/2018) for post-implant eval (6-7 days).  Future Appointments  Date Time Provider [place]  03/13/2018 11:00 AM Vevelyn Francois, NP [facility] None   Primary Care Physician: Jodi Marble, MD Location: [facility] Note by: Gaspar Cola, MD Date: 01/17/2018; Time: 11:08 AM  Disclaimer:  Medicine is not an Chief Strategy Officer. The only guarantee in medicine is that nothing is guaranteed. It is important to note that the decision to proceed with this intervention was based on the information collected from the patient. The Data and conclusions were drawn from the patient's questionnaire, the interview, and the physical examination. Because the information was provided in large part by the patient, it cannot be guaranteed that it has not been purposely or unconsciously manipulated. Every effort has been made to obtain as much relevant data as possible for this evaluation. It is important to note that the conclusions that lead to this procedure are derived in large part from the available data. Always take into account that the treatment will also be dependent on availability of resources and existing treatment guidelines, considered by other Pain Management Practitioners as being common knowledge and  practice, at the time of the intervention. For Medico-Legal purposes, it is also important to point out that variation in procedural techniques and pharmacological choices are the acceptable norm. The indications, contraindications, technique, and results of the above procedure should only be interpreted and judged by a Board-Certified Interventional Pain Specialist with extensive familiarity and expertise in the same exact  procedure and technique.

## 2018-01-17 NOTE — Patient Instructions (Signed)
___________________________________________________________________________________________ ? ?PATIENT DISCHARGE INSTRUCTIONS FOLLOWING SPINAL CORD STIMULATOR TRIAL IMPLANT ? ?You will be discharged from the clinic on the same day as your surgery, after you are fully recovered. Make ?arrangements to be driven home. DO NOT DRIVE YOURSELF!! ? ?The bandage over the side incision may drain a small amount of blood. This is normal; don?t be alarmed. ?Be certain to review all warnings/precautions in the patient brochure accompanying your stimulator. ?Please call the Pain Clinic to make an appointment for follow?up care. ? ?Instructions: ?Food intake: Start with clear liquids (like water) and advance to regular food, as tolerated.  ?Physical activities:  ?No restrictions during the trial period except for submerging in water above the implant area. ?Driving: You can start to drive again when you are fully recovered from the effects of the sedation (24 hours). ?Blood thinner: If you take a blood thinner, restart it 6 hours after your procedure. (Only for those taking blood thinners) ?Insulin: If you use insulin, as soon as you can eat, you may resume your normal dosing schedule. (Only for those taking insulin) ?Wound dressing care: Keep procedure site clean and dry. Clean wound with alcohol, once a day for the first 7 days, starting 48 hours after the surgery.  ?Keep incisions clean and dry. Cover with plastic wrap and tape when showering. Do not submerge incisions in a bathtub, pool or spa. ?Follow-up appointment: Keep your follow-up appointment after the procedure. Usually 6-7 days to remove trial leads.  ? ?Expect: ?From numbing medicine (AKA: Local Anesthetics): Numbness or decrease in pain. ?Duration: On the average, 1 to 8 hours.  ?From procedure: Some discomfort is to be expected once the numbing medicine wears off. This should be minimal. Use your regular pain medicines.  ? ?Call if: ?You experience numbness and  weakness that gets worse with time, as opposed to wearing off. ?New onset bowel or bladder incontinence. ?develop a temperature over 101.5? ?are unable to urinate ?have increasing difficulty walking or using your legs ?have incisional pain that continues to increase rather than stabilize or improve ?have any drainage of pus from your incisions ?have drainage which completely saturates the bandage ? ?Emergency Numbers: ?Durning business hours (Monday - Thursday, 8:00 AM - 4:00 PM) (Friday, 9:00 AM - 12:00 Noon): [phone] ?After hours: [phone] ?____________________________________________________________________________________________ ?  ?

## 2018-01-17 NOTE — Progress Notes (Signed)
Safety precautions to be maintained throughout the outpatient stay will include: orient to surroundings, keep bed in low position, maintain call bell within reach at all times, provide assistance with transfer out of bed and ambulation.  

## 2018-01-18 ENCOUNTER — Telehealth: Payer: Self-pay | Admitting: *Deleted

## 2018-01-18 NOTE — Telephone Encounter (Signed)
I spoke with [name] about patient c/o increased pain in the left leg.  Dr Lowella Dandy would like me to call [employer] Representative to address.   Called Manuela Schwartz and she is going to call Mr Quezada today and stay in close touch with them to try and improve his pain.

## 2018-01-18 NOTE — Telephone Encounter (Signed)
Spoke with patient and his wife together.  States he had a really rough night last evening that his leg is hurting worse than before the trial.  States that he has episodes of extreme pain that makes him feel as if his leg is going to out from under him.  This is not new but is also a little worse.  C/o soreness in the back which I told him was to be expected from the manipulation.  Dressing has a small amount of residual bleeding but is no more than what it was on yesterday.  Manuela Schwartz, [employer] representative has not called them yet this morning but should be touching base with them shortly.  Stimulator is on, he does not feel any tingling right now and does not have pain relief.  He is taking pain medication as prescribed.  Used ice to site on yesterday which helped but did not seem to make a difference this morning.

## 2018-01-22 ENCOUNTER — Ambulatory Visit: Payer: Self-pay | Admitting: Pain Medicine

## 2018-01-22 DIAGNOSIS — M4804 Spinal stenosis, thoracic region: Secondary | ICD-10-CM | POA: Insufficient documentation

## 2018-01-22 NOTE — Progress Notes (Signed)
Patient's Name: Chase Mendez  MRN: [id number]  Referring Provider: Jodi Marble, MD  DOB: 03/05/1934  PCP: Jodi Marble, MD  DOS: 01/23/2018  Note by: Gaspar Cola, MD  Service setting: Ambulatory outpatient  Specialty: Interventional Pain Management  Patient type: Established  Location: [facility] (AMB) Pain Management Facility  Visit type: Post-operative Evaluation (SCS Trial)   Primary Reason(s) for Visit: Encounter for removal of temporary spinal cord stimulator lead(s) and evaluation of trial implant. CC: Back Pain  HPI  Chase Mendez is a 83 y.o. year old, male patient, who comes today for a post-procedure evaluation. He has Pneumonia; ARF (acute renal failure) ([place]); Spinal accessory neuropathy; Varicose veins of both lower extremities with pain; Venous reflux; Lower limb pain, inferior, left (L5) ([place]); Chronic low back pain (Secondary Area of Pain) (Bilateral) (ML) (L>R) with sciatica (Left); Chronic lower extremity pain (Primary Area of Pain) (Left); Failed back surgical syndrome; Foot drop (Left); Lumbosacral radiculopathy at L5; Disorder of skeletal system; Pharmacologic therapy; Problems influencing health status; Opioid Drug tolerance; History of acute renal failure (12/08/2014); History of encephalopathy (12/08/2014); History of sepsis (12/08/2014); DDD (degenerative disc disease), lumbar; Chronic pain syndrome; Cellulitis of right hand; Thoracic central spinal stenosis; and Claustrophobia on their problem list. His primarily concern today is the Back Pain  Pain Assessment: Location: Left, Lower Back Radiating: radiaties down back of left leg Onset: More than a month ago Duration: Chronic pain Quality: Discomfort, Constant, Sore, Dull Severity: 4 /10 (subjective, self-reported pain score)  Note: Reported level is inconsistent with clinical observations. Clinically the patient looks like a 3/10 A 3/10 is viewed as "Moderate" and described as significantly interfering  with activities of daily living (ADL). It becomes difficult to feed, bathe, get dressed, get on and off the toilet or to perform personal hygiene functions. Difficult to get in and out of bed or a chair without assistance. Very distracting. With effort, it can be ignored when deeply involved in activities. Chase Mendez does not seem to understand the use of our objective pain scale When using our objective Pain Scale, levels between 6 and 10/10 are said to belong in an emergency room, as it progressively worsens from a 6/10, described as severely limiting, requiring emergency care not usually available at an outpatient pain management facility. At a 6/10 level, communication becomes difficult and requires great effort. Assistance to reach the emergency department may be required. Facial flushing and profuse sweating along with potentially dangerous increases in heart rate and blood pressure will be evident. Effect on ADL: limits my daily activities Timing: Constant Modifying factors: medications  BP: (!) 144/70  HR: 71  Chase Mendez comes in today, after a SCS (Spinal Cord Stimulator) Trial Implant on 01/18/2018, to have his percutaneous, temporary neurostimulator lead(s) removed and to evaluate the trial experience to determine if a permanent implant may be effective in controlling some or all of his chronic pain symptoms.  The patient indicates having had excellent result with final cord stimulator trial.  The first day or 2 after the trial, he had a little bit of a flareup of the pain secondary to the manipulation of the epidural leads.  However, this subsided and he was able to enjoy the benefit of the trial.  He describes having more than 50% relief of the pain with the device and he also indicated having been able to do more activity and take less medication.  This would be considered to be a successful trial and therefore  we will be referring him to neurosurgery for permanent implant.  However, because  during the initial placement of the leads I encountered some obstruction to the placement of the second lead, I have decided to order an MRI of the thoracic spine to evaluate the area.  Reviewing his records, I cannot find an MRI of the area or even a CT scan.  It is my impression, based on the findings that he may be having some degree of spinal stenosis in the region.  In preparation for the possible implant surgery, I will order this MRI to evaluate the area where the implant will be placed.  Further details on both, my assessment(s), as well as the proposed treatment plan, please see below.  Post-operative Assessment  Intra-procedural problems/complications: Unable to thread blood 1-lead in thoracic region.  The second lead encountered an obstruction to the past suggesting the possibility of some spinal stenosis.         Reported side-effects: Procedure-associated transient increase in pain. Post-surgical adverse reactions or complications: None reported         Laboratory Chemistry  Hematology Lab Results  Component Value Date   PLT 169 01/25/2017   HGB 16.6 01/25/2017   HCT 47.0 01/25/2017                         Note: No apparent post-operative complications.  Recent Imaging Results   Results for orders placed in visit on 01/17/18  DG C-Arm 1-60 Min-No Report   Narrative Fluoroscopy was utilized by the requesting physician.  No radiographic  interpretation.        Interpretation Report: Fluoroscopy was used during the procedure to assist with needle guidance. The images were interpreted intraoperatively by the requesting physician.  Meds   Current Outpatient Medications:  .  allopurinol (ZYLOPRIM) 100 MG tablet, Take 100 mg by mouth daily., Disp: , Rfl: 0 .  aspirin EC 81 MG tablet, Take 81 mg by mouth daily., Disp: , Rfl:  .  atorvastatin (LIPITOR) 10 MG tablet, Take 10 mg by mouth every evening., Disp: , Rfl: 0 .  cephALEXin (KEFLEX) 500 MG capsule, Take 1 capsule (500  mg total) by mouth 3 (three) times daily for 7 days., Disp: 21 capsule, Rfl: 0 .  citalopram (CELEXA) 20 MG tablet, Take 20 mg by mouth daily., Disp: , Rfl:  .  Cyanocobalamin 1000 MCG/ML LIQD, Take 1,000 mg by mouth daily., Disp: , Rfl:  .  diphenhydrAMINE (BENADRYL) 25 MG tablet, Take 25 mg by mouth every 6 (six) hours as needed., Disp: , Rfl:  .  fluticasone (FLONASE) 50 MCG/ACT nasal spray, fluticasone propionate 50 mcg/act susp, Disp: , Rfl:  .  gabapentin (NEURONTIN) 400 MG capsule, Take 400 mg by mouth 3 (three) times daily., Disp: , Rfl:  .  isosorbide dinitrate (ISORDIL) 30 MG tablet, Take 30 mg by mouth 3 (three) times daily., Disp: , Rfl:  .  levothyroxine (SYNTHROID, LEVOTHROID) 150 MCG tablet, Take 150 mcg by mouth every morning., Disp: , Rfl: 0 .  linaclotide (LINZESS) 290 MCG CAPS capsule, Take 290 mcg by mouth daily before breakfast., Disp: , Rfl:  .  losartan (COZAAR) 50 MG tablet, Take 50 mg by mouth daily., Disp: , Rfl:  .  mirabegron ER (MYRBETRIQ) 25 MG TB24 tablet, Take 25 mg by mouth daily., Disp: , Rfl:  .  naloxone (NARCAN) nasal spray 4 mg/0.1 mL, Spray into one nostril. Repeat with second device into other  nostril after 2-3 minutes if no or minimal response. Use in case of opioid overdose., Disp: 1 kit, Rfl: 0 .  omeprazole (PRILOSEC) 20 MG capsule, Take 20 mg by mouth daily., Disp: , Rfl:  .  ondansetron (ZOFRAN ODT) 4 MG disintegrating tablet, Take 1 tablet (4 mg total) by mouth every 8 (eight) hours as needed for nausea or vomiting., Disp: 20 tablet, Rfl: 0 .  [START ON 02/19/2018] oxyCODONE (ROXICODONE) 15 MG immediate release tablet, Take 1 tablet (15 mg total) by mouth every 6 (six) hours as needed for pain., Disp: 120 tablet, Rfl: 0 .  oxyCODONE (ROXICODONE) 15 MG immediate release tablet, Take 1 tablet (15 mg total) by mouth every 6 (six) hours as needed for pain., Disp: 120 tablet, Rfl: 0 .  testosterone (ANDROGEL) 50 MG/5GM (1%) GEL, Place onto the skin daily.,  Disp: , Rfl:  .  vitamin E 400 UNIT capsule, Take 400 Units by mouth daily., Disp: , Rfl:  .  diazepam (VALIUM) 5 MG tablet, Take 1 tablet (5 mg total) by mouth as needed for up to 2 doses for anxiety (Take one tab 45 minutes before MRI. Take second tablet just prior to MRI scan). Do not take medication within 4 hours of taking opioid pain medications. Must have a driver. Do not drive or operate machinery x 24 hours after taking this medication., Disp: 2 tablet, Rfl: 0  ROS  Constitutional: Denies any fever or chills Gastrointestinal: No reported hemesis, hematochezia, vomiting, or acute GI distress Musculoskeletal: Denies any acute onset joint swelling, redness, loss of ROM, or weakness Neurological: No reported episodes of acute onset apraxia, aphasia, dysarthria, agnosia, amnesia, paralysis, loss of coordination, or loss of consciousness  Allergies  Chase Mendez is allergic to ibuprofen; sulfa antibiotics; sulfasalazine; and lidocaine.  [place]  Drug: Chase Mendez  reports no history of drug use. Alcohol:  reports no history of alcohol use. Tobacco:  reports that he has been smoking cigars. He has never used smokeless tobacco. Medical:  has a past medical history of Acute encephalopathy (12/08/2014), Anxiety, ARF (acute renal failure) ([place]) (12/08/2014), Back pain, Benign neoplasm of large bowel, Capsulitis, Chronic back pain, Depression, Diabetes mellitus without complication ([place]), Dysphagia, Exostosis, GERD (gastroesophageal reflux disease), Gout, Hypothyroidism, Insomnia, Low testosterone, Microscopic hematuria, Pneumonia (12/07/2014), Pressure ulcer (12/09/2014), Sepsis ([place]) (12/08/2014), and Thyroid disease. Surgical: Chase Mendez  has a past surgical history that includes Cholecystectomy; Appendectomy; Back surgery; Eye surgery; and Esophagogastroduodenoscopy (egd) with propofol (N/A, 04/24/2017). Family: family history includes Diabetes in his father; Heart disease in his mother.  Postop Exam    General appearance: Afebrile. Well nourished, well developed, and well hydrated. In no apparent acute distress. Vitals:   01/23/18 0954  BP: (!) 144/70  Pulse: 71  Temp: 98.3 F (36.8 C)  SpO2: 96%  Weight: 195 lb (88.5 kg)  Height: 6' (1.829 m)   BMI Assessment: Estimated body mass index is 26.45 kg/m as calculated from the following:   Height as of this encounter: 6' (1.829 m).   Weight as of this encounter: 195 lb (88.5 kg).  Surgical site: Wound is healing well. No redness, tenderness, discharge, abnormal odors, or any other evidence of infection or complications.  Assessment  Primary Diagnosis & Pertinent Problem List: The primary encounter diagnosis was Chronic lower extremity pain (Primary Area of Pain) (Left). Diagnoses of Chronic low back pain (Secondary Area of Pain) (Bilateral) (ML) (L>R) with sciatica (Left), Failed back surgical syndrome, Thoracic central spinal stenosis, Spinal stenosis of thoracic  region, Chronic pain syndrome, and Claustrophobia were also pertinent to this visit.  Status Diagnosis  Improving Improving Stable 1. Chronic lower extremity pain (Primary Area of Pain) (Left)   2. Chronic low back pain (Secondary Area of Pain) (Bilateral) (ML) (L>R) with sciatica (Left)   3. Failed back surgical syndrome   4. Thoracic central spinal stenosis   5. Spinal stenosis of thoracic region   6. Chronic pain syndrome   7. Claustrophobia      Plan of Care   Procedure Orders    No procedure(s) ordered today    Referral Orders     Ambulatory referral to Neurosurgery  Interventional management options: Planned, scheduled, and/or pending:   Removal of spinal cord stimulator trial lead, today. Referral to Neurosurgery for permanent implant. MRI of the thoracic spine to evaluate the area preoperatively.   Considering:   Diagnostic caudal epidural steroid injection + epidurogram Possible Racz procedure Diagnostic bilateral lumbar facet block Possible  bilateral lumbar facet RFA Possible candidate for intrathecal pump trial and implant Possible candidate for spinal cord stimulator trial and implant   Palliative PRN treatment(s):   Diagnostic left-sided L4 and [id number]   Provider-requested follow-up: Return for Med-Mgmt, w/ Dionisio David, NP.  Future Appointments  Date Time Provider [place]  02/12/2018  9:00 AM [facility]  02/18/2018 10:30 AM Milinda Pointer, MD [facility] None  03/13/2018 11:00 AM Vevelyn Francois, NP [facility] None   Primary Care Physician: Jodi Marble, MD Location: [facility] Outpatient Pain Management Facility Note by: Gaspar Cola, MD Date: 01/23/2018; Time: 11:31 AM

## 2018-01-23 ENCOUNTER — Other Ambulatory Visit: Payer: Self-pay

## 2018-01-23 ENCOUNTER — Encounter: Payer: Self-pay | Admitting: Pain Medicine

## 2018-01-23 ENCOUNTER — Ambulatory Visit: Payer: Medicare Other | Attending: Pain Medicine | Admitting: Pain Medicine

## 2018-01-23 VITALS — BP 144/70 | HR 71 | Temp 98.3°F | Ht 72.0 in | Wt 195.0 lb

## 2018-01-23 DIAGNOSIS — Z7982 Long term (current) use of aspirin: Secondary | ICD-10-CM | POA: Diagnosis not present

## 2018-01-23 DIAGNOSIS — Z7951 Long term (current) use of inhaled steroids: Secondary | ICD-10-CM | POA: Diagnosis not present

## 2018-01-23 DIAGNOSIS — I83813 Varicose veins of bilateral lower extremities with pain: Secondary | ICD-10-CM | POA: Diagnosis not present

## 2018-01-23 DIAGNOSIS — F40240 Claustrophobia: Secondary | ICD-10-CM | POA: Diagnosis not present

## 2018-01-23 DIAGNOSIS — M961 Postlaminectomy syndrome, not elsewhere classified: Secondary | ICD-10-CM

## 2018-01-23 DIAGNOSIS — G8929 Other chronic pain: Secondary | ICD-10-CM

## 2018-01-23 DIAGNOSIS — L03113 Cellulitis of right upper limb: Secondary | ICD-10-CM | POA: Insufficient documentation

## 2018-01-23 DIAGNOSIS — M109 Gout, unspecified: Secondary | ICD-10-CM | POA: Insufficient documentation

## 2018-01-23 DIAGNOSIS — J189 Pneumonia, unspecified organism: Secondary | ICD-10-CM | POA: Diagnosis not present

## 2018-01-23 DIAGNOSIS — F329 Major depressive disorder, single episode, unspecified: Secondary | ICD-10-CM | POA: Diagnosis not present

## 2018-01-23 DIAGNOSIS — G894 Chronic pain syndrome: Secondary | ICD-10-CM | POA: Diagnosis not present

## 2018-01-23 DIAGNOSIS — E039 Hypothyroidism, unspecified: Secondary | ICD-10-CM | POA: Insufficient documentation

## 2018-01-23 DIAGNOSIS — M5442 Lumbago with sciatica, left side: Secondary | ICD-10-CM | POA: Diagnosis not present

## 2018-01-23 DIAGNOSIS — M79605 Pain in left leg: Secondary | ICD-10-CM | POA: Diagnosis not present

## 2018-01-23 DIAGNOSIS — N179 Acute kidney failure, unspecified: Secondary | ICD-10-CM | POA: Diagnosis not present

## 2018-01-23 DIAGNOSIS — K219 Gastro-esophageal reflux disease without esophagitis: Secondary | ICD-10-CM | POA: Diagnosis not present

## 2018-01-23 DIAGNOSIS — M21379 Foot drop, unspecified foot: Secondary | ICD-10-CM | POA: Diagnosis not present

## 2018-01-23 DIAGNOSIS — M549 Dorsalgia, unspecified: Secondary | ICD-10-CM | POA: Diagnosis present

## 2018-01-23 DIAGNOSIS — L03119 Cellulitis of unspecified part of limb: Secondary | ICD-10-CM | POA: Insufficient documentation

## 2018-01-23 DIAGNOSIS — Z8661 Personal history of infections of the central nervous system: Secondary | ICD-10-CM | POA: Insufficient documentation

## 2018-01-23 DIAGNOSIS — M5416 Radiculopathy, lumbar region: Secondary | ICD-10-CM | POA: Diagnosis not present

## 2018-01-23 DIAGNOSIS — Z79899 Other long term (current) drug therapy: Secondary | ICD-10-CM | POA: Diagnosis not present

## 2018-01-23 DIAGNOSIS — M4804 Spinal stenosis, thoracic region: Secondary | ICD-10-CM | POA: Insufficient documentation

## 2018-01-23 MED ORDER — DIAZEPAM 5 MG PO TABS: 5.0000 mg | ORAL_TABLET | ORAL | 0 refills | Status: DC | PRN

## 2018-01-23 NOTE — Patient Instructions (Signed)
Patient requests [facility] neurology for permanent Spinal Cord Stimulator implant. To return for medication refill. MRI ordered. Pain Management Discharge Instructions  General Discharge Instructions :  If you need to reach your doctor call: Monday-Friday 8:00 am - 4:00 pm at [phone] or toll free [phone].  After clinic hours [phone] to have operator reach doctor.  Bring all of your medication bottles to all your appointments in the pain clinic.  To cancel or reschedule your appointment with Pain Management please remember to call 24 hours in advance to avoid a fee.  Refer to the educational materials which you have been given on: General Risks, I had my Procedure. Discharge Instructions, Post Sedation.  Post Procedure Instructions:  The drugs you were given will stay in your system until tomorrow, so for the next 24 hours you should not drive, make any legal decisions or drink any alcoholic beverages.  You may eat anything you prefer, but it is better to start with liquids then soups and crackers, and gradually work up to solid foods.  Please notify your doctor immediately if you have any unusual bleeding, trouble breathing or pain that is not related to your normal pain.  Depending on the type of procedure that was done, some parts of your body may feel week and/or numb.  This usually clears up by tonight or the next day.  Walk with the use of an assistive device or accompanied by an adult for the 24 hours.  You may use ice on the affected area for the first 24 hours.  Put ice in a Ziploc bag and cover with a towel and place against area 15 minutes on 15 minutes off.  You may switch to heat after 24 hours.

## 2018-01-25 ENCOUNTER — Emergency Department
Admission: EM | Admit: 2018-01-25 | Discharge: 2018-01-25 | Disposition: A | Discharge status: Home / Self Care | Payer: Medicare Other | Attending: Emergency Medicine | Admitting: Emergency Medicine

## 2018-01-25 ENCOUNTER — Encounter: Payer: Self-pay | Admitting: Emergency Medicine

## 2018-01-25 ENCOUNTER — Other Ambulatory Visit: Payer: Self-pay

## 2018-01-25 ENCOUNTER — Emergency Department: Payer: Medicare Other

## 2018-01-25 DIAGNOSIS — Z79899 Other long term (current) drug therapy: Secondary | ICD-10-CM | POA: Diagnosis not present

## 2018-01-25 DIAGNOSIS — S91115A Laceration without foreign body of left lesser toe(s) without damage to nail, initial encounter: Secondary | ICD-10-CM | POA: Insufficient documentation

## 2018-01-25 DIAGNOSIS — F17290 Nicotine dependence, other tobacco product, uncomplicated: Secondary | ICD-10-CM | POA: Insufficient documentation

## 2018-01-25 DIAGNOSIS — Y998 Other external cause status: Secondary | ICD-10-CM | POA: Diagnosis not present

## 2018-01-25 DIAGNOSIS — Y929 Unspecified place or not applicable: Secondary | ICD-10-CM | POA: Diagnosis not present

## 2018-01-25 DIAGNOSIS — Y9301 Activity, walking, marching and hiking: Secondary | ICD-10-CM | POA: Insufficient documentation

## 2018-01-25 DIAGNOSIS — Z7982 Long term (current) use of aspirin: Secondary | ICD-10-CM | POA: Insufficient documentation

## 2018-01-25 DIAGNOSIS — W231XXA Caught, crushed, jammed, or pinched between stationary objects, initial encounter: Secondary | ICD-10-CM | POA: Insufficient documentation

## 2018-01-25 DIAGNOSIS — E119 Type 2 diabetes mellitus without complications: Secondary | ICD-10-CM | POA: Diagnosis not present

## 2018-01-25 DIAGNOSIS — E039 Hypothyroidism, unspecified: Secondary | ICD-10-CM | POA: Diagnosis not present

## 2018-01-25 DIAGNOSIS — S99922A Unspecified injury of left foot, initial encounter: Secondary | ICD-10-CM | POA: Diagnosis present

## 2018-01-25 MED ORDER — PROMETHAZINE HCL 25 MG PO TABS
12.5000 mg | ORAL_TABLET | Freq: Once | ORAL | Status: AC
  Administered 2018-01-25: 12.5 mg via ORAL
  Filled 2018-01-25: qty 1

## 2018-01-25 MED ORDER — ONDANSETRON 4 MG PO TBDP
4.0000 mg | ORAL_TABLET | Freq: Once | ORAL | Status: DC
  Filled 2018-01-25: qty 1

## 2018-01-25 MED ORDER — LIDOCAINE HCL (PF) 1 % IJ SOLN
5.0000 mL | Freq: Once | INTRAMUSCULAR | Status: AC
  Administered 2018-01-25: 5 mL
  Filled 2018-01-25: qty 5

## 2018-01-25 NOTE — ED Triage Notes (Signed)
Pt to ed with c/o left foot second toe pain after getting it caught in his carpet at home.

## 2018-01-25 NOTE — Discharge Instructions (Addendum)
Follow-up with your regular doctor in 14 days for suture removal.  Return emergency department if any concerns or signs of infection.  Clean the area with soap and water daily.

## 2018-01-25 NOTE — ED Provider Notes (Signed)
[facility] Emergency Department Provider Note  ____________________________________________   First MD Initiated Contact with Patient 01/25/18 [phone]     (approximate)  I have reviewed the triage vital signs and the nursing notes.   HISTORY  Chief Complaint Toe Pain    HPI Chase Mendez is a 83 y.o. male presents emergency department complaining of left second toe pain.  He has dropfoot and it got caught in the carpet resulting in a laceration to the toe.  He has had a previous laceration in the same area.  No other injuries noted.    Past Medical History:  Diagnosis Date  . Acute encephalopathy 12/08/2014  . Anxiety   . ARF (acute renal failure) ([place]) 12/08/2014  . Back pain   . Benign neoplasm of large bowel   . Capsulitis    fractured displaced metatarsal with capsulitis  . Chronic back pain   . Depression   . Diabetes mellitus without complication ([place])   . Dysphagia   . Exostosis    painful, right hallux  . GERD (gastroesophageal reflux disease)   . Gout   . Hypothyroidism   . Insomnia   . Low testosterone   . Microscopic hematuria   . Pneumonia 12/07/2014  . Pressure ulcer 12/09/2014  . Sepsis ([place]) 12/08/2014  . Thyroid disease     Patient Active Problem List   Diagnosis Date Noted  . Claustrophobia 01/23/2018  . Thoracic central spinal stenosis 01/22/2018  . Cellulitis of right hand 12/06/2017  . Chronic pain syndrome 07/11/2017  . DDD (degenerative disc disease), lumbar 06/19/2017  . Opioid Drug tolerance 06/10/2017  . History of acute renal failure (12/08/2014) 06/10/2017  . History of encephalopathy (12/08/2014) 06/10/2017  . History of sepsis (12/08/2014) 06/10/2017  . Lower limb pain, inferior, left (L5) ([place]) 05/16/2017  . Chronic low back pain (Secondary Area of Pain) (Bilateral) (ML) (L>R) with sciatica (Left) 05/16/2017  . Chronic lower extremity pain (Primary Area of Pain) (Left) 05/16/2017  . Failed back  surgical syndrome 05/16/2017  . Foot drop (Left) 05/16/2017  . Lumbosacral radiculopathy at L5 05/16/2017  . Disorder of skeletal system 05/16/2017  . Pharmacologic therapy 05/16/2017  . Problems influencing health status 05/16/2017  . Venous reflux 07/12/2016  . 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 Varicose veins of both lower extremities with pain 05/09/2016  . ARF (acute renal failure) ([place]) 12/08/2014  . Pneumonia 12/07/2014    Past Surgical History:  Procedure Laterality Date  . APPENDECTOMY    . BACK SURGERY    . CHOLECYSTECTOMY    . ESOPHAGOGASTRODUODENOSCOPY (EGD) WITH PROPOFOL N/A 04/24/2017   Procedure: ESOPHAGOGASTRODUODENOSCOPY (EGD) WITH PROPOFOL;  Surgeon: Manya Silvas, MD;  Location: [facility] ENDOSCOPY;  Service: Endoscopy;  Laterality: N/A;  . EYE SURGERY      Prior to Admission medications   Medication Sig Start Date End Date Taking? Authorizing Provider  allopurinol (ZYLOPRIM) 100 MG tablet Take 100 mg by mouth daily. 10/26/14   [provider]  aspirin EC 81 MG tablet Take 81 mg by mouth daily.    [provider]  atorvastatin (LIPITOR) 10 MG tablet Take 10 mg by mouth every evening. 04/08/16   [provider]  citalopram (CELEXA) 20 MG tablet Take 20 mg by mouth daily.    [provider]  Cyanocobalamin 1000 MCG/ML LIQD Take 1,000 mg by mouth daily.    [provider]  diazepam (VALIUM) 5 MG tablet Take 1 tablet (5 mg total)  by mouth as needed for up to 2 doses for anxiety (Take one tab 45 minutes before MRI. Take second tablet just prior to MRI scan). Do not take medication within 4 hours of taking opioid pain medications. Must have a driver. Do not drive or operate machinery x 24 hours after taking this medication. 01/23/18   Milinda Pointer, MD  diphenhydrAMINE (BENADRYL) 25 MG tablet Take 25 mg by mouth every 6 (six) hours as needed.    [provider]  fluticasone (FLONASE) 50 MCG/ACT nasal spray  fluticasone propionate 50 mcg/act susp    [provider]  gabapentin (NEURONTIN) 400 MG capsule Take 400 mg by mouth 3 (three) times daily.    [provider]  isosorbide dinitrate (ISORDIL) 30 MG tablet Take 30 mg by mouth 3 (three) times daily.    [provider]  levothyroxine (SYNTHROID, LEVOTHROID) 150 MCG tablet Take 150 mcg by mouth every morning. 09/12/14   [provider]  linaclotide (LINZESS) 290 MCG CAPS capsule Take 290 mcg by mouth daily before breakfast.    [provider]  losartan (COZAAR) 50 MG tablet Take 50 mg by mouth daily.    [provider]  mirabegron ER (MYRBETRIQ) 25 MG TB24 tablet Take 25 mg by mouth daily.    [provider]  naloxone [facility]) nasal spray 4 mg/0.1 mL Spray into one nostril. Repeat with second device into other nostril after 2-3 minutes if no or minimal response. Use in case of opioid overdose. 09/04/17   Vevelyn Francois, NP  omeprazole (PRILOSEC) 20 MG capsule Take 20 mg by mouth daily.    [provider]  ondansetron (ZOFRAN ODT) 4 MG disintegrating tablet Take 1 tablet (4 mg total) by mouth every 8 (eight) hours as needed for nausea or vomiting. 08/21/16   Darel Hong, MD  oxyCODONE (ROXICODONE) 15 MG immediate release tablet Take 1 tablet (15 mg total) by mouth every 6 (six) hours as needed for pain. 02/19/18 03/21/18  Vevelyn Francois, NP  oxyCODONE (ROXICODONE) 15 MG immediate release tablet Take 1 tablet (15 mg total) by mouth every 6 (six) hours as needed for pain. 01/20/18 02/19/18  Vevelyn Francois, NP  testosterone (ANDROGEL) 50 MG/5GM (1%) GEL Place onto the skin daily.    [provider]  vitamin E 400 UNIT capsule Take 400 Units by mouth daily.    [provider]    Allergies Ibuprofen; Sulfa antibiotics; Sulfasalazine; and Lidocaine  Family History  Problem Relation Age of Onset  . Heart disease Mother   . Diabetes Father     Social History Social  History   Tobacco Use  . Smoking status: Current Every Day Smoker    Types: Cigars  . Smokeless tobacco: Never Used  Substance Use Topics  . Alcohol use: No  . Drug use: No    Review of Systems  Constitutional: No fever/chills Eyes: No visual changes. ENT: No sore throat. Respiratory: Denies cough Genitourinary: Negative for dysuria. Musculoskeletal: Negative for back pain. Skin: Negative for rash.  Laceration to the left second toe    ____________________________________________   PHYSICAL EXAM:  VITAL SIGNS: ED Triage Vitals [01/25/18 0931]  Enc Vitals Group     BP (!) 141/65     Pulse Rate 66     Resp 16     Temp 98.2 F (36.8 C)     Temp Source Oral     SpO2 100 %     Weight 195 lb (88.5  kg)     Height 6' (1.829 m)     Head Circumference      Peak Flow      Pain Score 2     Pain Loc      Pain Edu?      Excl. in [place]?     Constitutional: Alert and oriented. Well appearing and in no acute distress. Eyes: Conjunctivae are normal.  Head: Atraumatic. Nose: No congestion/rhinnorhea. Mouth/Throat: Mucous membranes are moist.   Neck:  supple no lymphadenopathy noted Cardiovascular: Normal rate, regular rhythm Respiratory: Normal respiratory effort.  No retractions GU: deferred Musculoskeletal: FROM all extremities, warm and well perfused, left second toe is mildly tender, neurovascular is intact, a 1cm laceration is noted Neurologic:  Normal speech and language.  Skin:  Skin is warm, dry, laceration to the left second toe. No rash noted. Psychiatric: Mood and affect are normal. Speech and behavior are normal.  ____________________________________________   LABS (all labs ordered are listed, but only abnormal results are displayed)  Labs Reviewed - No data to display ____________________________________________   ____________________________________________  RADIOLOGY  X-ray left second toe is negative for  fracture  ____________________________________________   PROCEDURES  Procedure(s) performed:   Marland KitchenMarland KitchenLaceration Repair Date/Time: 01/25/2018 11:34 AM Performed by: Versie Starks, PA-C Authorized by: Versie Starks, PA-C   Consent:    Consent obtained:  Verbal   Consent given by:  Patient   Risks discussed:  Infection, pain, poor cosmetic result and poor wound healing Anesthesia (see MAR for exact dosages):    Anesthesia method:  Nerve block   Block anesthetic:  Lidocaine 1% w/o epi   Block injection procedure:  Anatomic landmarks identified, introduced needle, incremental injection, anatomic landmarks palpated and negative aspiration for blood   Block outcome:  Anesthesia achieved Laceration details:    Location:  Toe   Toe location:  L second toe   Length (cm):  1   Depth (mm):  2 Repair type:    Repair type:  Simple Pre-procedure details:    Preparation:  Patient was prepped and draped in usual sterile fashion Exploration:    Hemostasis achieved with:  Direct pressure   Wound exploration: wound explored through full range of motion     Wound extent: no foreign bodies/material noted, no muscle damage noted, no tendon damage noted and no underlying fracture noted   Treatment:    Area cleansed with:  Betadine and saline   Amount of cleaning:  Standard   Irrigation solution:  Sterile saline   Irrigation method:  Syringe and tap Skin repair:    Repair method:  Sutures   Suture size:  5-0   Suture material:  Nylon   Suture technique:  Simple interrupted   Number of sutures:  7 Approximation:    Approximation:  Close Post-procedure details:    Dressing:  Non-adherent dressing   Patient tolerance of procedure:  Tolerated well, no immediate complications      ____________________________________________   INITIAL IMPRESSION / ASSESSMENT AND PLAN / ED COURSE  Pertinent labs & imaging results that were available during my care of the patient were reviewed by me and  considered in my medical decision making (see chart for details).   Patient is an 83 year old male presents emergency department complaint of a laceration to the left second toe  Physical exam shows a laceration along the distal phalanx that the area below the nail.  X-ray of the left second toe is negative for fracture  See procedure  note for repair  Patient was instructed to follow-up with his regular doctor in 14 days for suture removal.  The last wound took 14 days to heal.  He states he understands.  They were given care instructions for suture care.  He was discharged in stable condition in the care of his family.     As part of my medical decision making, I reviewed the following data within the [place] History obtained from family, Nursing notes reviewed and incorporated, Old chart reviewed, Radiograph reviewed x-ray of the left second toe is negative for fracture, Notes from prior ED visits and [place] Controlled Substance Database  ____________________________________________   FINAL CLINICAL IMPRESSION(S) / ED DIAGNOSES  Final diagnoses:  Laceration of second toe, left, initial encounter      NEW MEDICATIONS STARTED DURING THIS VISIT:  New Prescriptions   No medications on file     Note:  This document was prepared using Dragon voice recognition software and may include unintentional dictation errors.    Versie Starks, PA-C 01/25/18 1137    Lavonia Drafts, MD 01/25/18 [phone]

## 2018-02-06 DIAGNOSIS — R296 Repeated falls: Secondary | ICD-10-CM

## 2018-02-06 HISTORY — DX: Repeated falls: R29.6

## 2018-02-12 ENCOUNTER — Ambulatory Visit
Admission: RE | Admit: 2018-02-12 | Discharge: 2018-02-12 | Disposition: A | Discharge status: Home / Self Care | Payer: Medicare Other | Source: Ambulatory Visit | Attending: Pain Medicine | Admitting: Pain Medicine

## 2018-02-12 DIAGNOSIS — M4804 Spinal stenosis, thoracic region: Secondary | ICD-10-CM | POA: Insufficient documentation

## 2018-02-12 DIAGNOSIS — G8929 Other chronic pain: Secondary | ICD-10-CM

## 2018-02-12 DIAGNOSIS — M5442 Lumbago with sciatica, left side: Secondary | ICD-10-CM | POA: Insufficient documentation

## 2018-02-17 NOTE — Progress Notes (Signed)
Patient's Name: Chase Mendez  MRN: [id number]  Referring Provider: Jodi Marble, MD  DOB: 1934/06/08  PCP: Jodi Marble, MD  DOS: 02/18/2018  Note by: Gaspar Cola, MD  Service setting: Ambulatory outpatient  Specialty: Interventional Pain Management  Location: [facility] (AMB) Pain Management Facility    Patient type: Established   Primary Reason(s) for Visit: Evaluation of chronic illnesses with exacerbation, or progression (Level of risk: moderate) CC: Back Pain (lower bilateral, left is worse )  HPI  Chase Mendez is a 84 y.o. year old, male patient, who comes today for a follow-up evaluation. He has Pneumonia; ARF (acute renal failure) ([place]); Spinal accessory neuropathy; Varicose veins of both lower extremities with pain; Venous reflux; Lower limb pain, inferior, left (L5) (Foot Drop); Chronic low back pain (Secondary Area of Pain) (Bilateral) (ML) (L>R) with sciatica (Left); Chronic lower extremity pain (Primary Area of Pain) (Left); Failed back surgical syndrome; Foot drop (Left); Lumbosacral radiculopathy at L5; Disorder of skeletal system; Pharmacologic therapy; Problems influencing health status; Opioid Drug tolerance; History of acute renal failure (12/08/2014); History of encephalopathy (12/08/2014); History of sepsis (12/08/2014); DDD (degenerative disc disease), lumbar; Chronic pain syndrome; Cellulitis of right hand; Thoracic central spinal stenosis; and Claustrophobia on their problem list. Chase Mendez was last seen on 01/23/2018. His primarily concern today is the Back Pain (lower bilateral, left is worse )  Pain Assessment: Location: Lower, Left, Right(left is worse ) Back Radiating: down the left leg to the foot  Onset: More than a month ago Duration: Chronic pain Quality: Discomfort, Constant, Sore, Dull, Spasm(feels like the leg grabs and is weak.  is working with PT) Severity: 6 /10 (subjective, self-reported pain score)  Note: Reported level is inconsistent with  clinical observations. Clinically the patient looks like a 3/10 A 3/10 is viewed as "Moderate" and described as significantly interfering with activities of daily living (ADL). It becomes difficult to feed, bathe, get dressed, get on and off the toilet or to perform personal hygiene functions. Difficult to get in and out of bed or a chair without assistance. Very distracting. With effort, it can be ignored when deeply involved in activities. Chase Mendez continues to use a standard subjective pain scale, rather than an objective pain scale as instructed. When using our objective Pain Scale, levels between 6 and 10/10 are said to belong in an emergency room, as it progressively worsens from a 6/10, described as severely limiting, requiring emergency care not usually available at an outpatient pain management facility. At a 6/10 level, communication becomes difficult and requires great effort. Assistance to reach the emergency department may be required. Facial flushing and profuse sweating along with potentially dangerous increases in heart rate and blood pressure will be evident. Effect on ADL: limits what he is able to do during the day.  lays down a lot which relieves the pain.  Timing: Constant Modifying factors: SCS trial went well and medications  BP: (!) 149/79  HR: 72  Further details on both, my assessment(s), as well as the proposed treatment plan, please see below.  The patient returns today to go over the results of his thoracic MRI.  The results indicate that he is not having any thoracic spinal stenosis and therefore there is no immediate contraindication to the placement of the spinal cord stimulator.  He has an appointment this Thursday with Dr. Cari Caraway for evaluation and set up the spinal cord stimulator implant.  Laboratory Chemistry  Inflammation Markers (CRP: Acute Phase) (ESR: Chronic  Phase) Lab Results  Component Value Date   LATICACIDVEN 1.1 12/08/2014                          Rheumatology Markers No results found.  Renal Function Markers Lab Results  Component Value Date   BUN 16 01/25/2017   CREATININE 0.98 01/25/2017   GFRAA >60 01/25/2017   GFRNONAA >60 01/25/2017                             Hepatic Function Markers Lab Results  Component Value Date   AST 28 08/21/2016   ALT 18 08/21/2016   ALBUMIN 4.1 08/21/2016   ALKPHOS 112 08/21/2016   LIPASE 37 08/21/2016                        Electrolytes Lab Results  Component Value Date   NA 137 01/25/2017   K 3.2 (L) 01/25/2017   CL 99 (L) 01/25/2017   CALCIUM 9.2 01/25/2017                        Neuropathy Markers Lab Results  Component Value Date   HGBA1C 6.3 (H) 12/08/2014                        CNS Tests No results found.  Bone Pathology Markers No results found.  Coagulation Parameters Lab Results  Component Value Date   PLT 169 01/25/2017                        Cardiovascular Markers Lab Results  Component Value Date   TROPONINI <0.03 01/25/2017   HGB 16.6 01/25/2017   HCT 47.0 01/25/2017                         CA Markers No results found.  Note: Lab results reviewed.  Imaging Review  Thoracic Imaging: Thoracic MR wo contrast:  Results for orders placed during the hospital encounter of 02/12/18  MR THORACIC SPINE WO CONTRAST   Narrative CLINICAL DATA:  Thoracic spinal stenosis. Back pain. Preop spinal stimulator.  EXAM: MRI THORACIC SPINE WITHOUT CONTRAST  TECHNIQUE: Multiplanar, multisequence MR imaging of the thoracic spine was performed. No intravenous contrast was administered.  COMPARISON:  None.  FINDINGS: Alignment:  3 mm anterolisthesis T2-3.  Remaining alignment normal.  Vertebrae: Negative for fracture or mass. Hemangiomata T12 and L1 vertebral bodies.  Cord:  Negative for cord compression.  Normal spinal cord signal.  Paraspinal and other soft tissues: Negative for paraspinous mass or fluid collection.  Prior fluoroscopic images  of the thoracic spine 01/17/2018 reveal spinal cord stimulator. This appears to have been removed in the interval.  Disc levels:  T1-2: Negative  T2-3: 3 mm anterolisthesis. Bilateral facet degeneration. Negative for stenosis  T3-4: Small central disc protrusion. Generous size spinal canal. Mild facet degeneration  T4-5: Mild disc degeneration. Mild facet degeneration. Negative for stenosis  T5-6: Mild disc degeneration  T6-7: Mild facet degeneration.  T7-8: Small central disc protrusion. Negative for stenosis. Mild facet degeneration.  T8-9: Small central disc protrusion and mild facet degeneration. Negative for stenosis  T9-10: Mild disc and mild facet degeneration.  Negative for stenosis  T10-11: Mild facet degeneration.  Negative for stenosis  T11-12: Moderate facet degeneration bilaterally. Negative for stenosis  T12-L1: Bilateral facet degeneration.  Negative for stenosis.  IMPRESSION: Multilevel disc and facet degeneration throughout the thoracic spine. Generous size spinal canal. No significant stenosis or neural impingement in the thoracic spine.   Electronically Signed   By: Franchot Gallo M.D.   On: 02/12/2018 10:26    Lumbosacral Imaging: Lumbar MR wo contrast:  Results for orders placed during the hospital encounter of 05/24/17  MR LUMBAR SPINE WO CONTRAST   Narrative CLINICAL DATA:  Chronic left leg pain. Previous lumbar surgery. Chronic bilateral low back pain.  EXAM: MRI LUMBAR SPINE WITHOUT CONTRAST  TECHNIQUE: Multiplanar, multisequence MR imaging of the lumbar spine was performed. No intravenous contrast was administered.  COMPARISON:  CT 04/12/2009  FINDINGS: Segmentation:  5 lumbar type vertebral bodies.  Alignment: Straightening of the normal lumbar lordosis. Mild curvature convex towards the left.  Vertebrae: Benign appearing hemangioma within the left posterior inferior corner of the L1 vertebral body. Superior  endplate Schmorl's node at L5.  Conus medullaris and cauda equina: Conus extends to the L1 level. Conus and cauda equina appear normal.  Paraspinal and other soft tissues: Negative  Disc levels:  T11-12 and T12-L1: Normal appearance of the discs. Minimal facet hypertrophy. No stenosis.  L1-2: Endplate osteophytes and bulging of the disc. Bilateral facet degeneration and hypertrophy. No compressive stenosis.  L2-3: Disc bulge.  Mild facet hypertrophy.  No stenosis.  L3-4: Disc bulge. Mild facet hypertrophy. Mild narrowing of the lateral recesses but without visible neural compression.  L4-5: Bulging of the disc more prominent towards the right. Facet and ligamentous hypertrophy. Stenosis of the right lateral recess that could compress the right L5 nerve. Right L4 nerve appears to exit without compression.  L5-S1: Previous left hemilaminectomy. Endplate osteophytes and bulging of the disc towards the right. Mild facet degeneration. Mild narrowing of the subarticular lateral recess on the right. Foraminal narrowing on the left that could affect the exiting L5 nerve.  IMPRESSION: Multilevel degenerative disc disease and degenerative facet disease as outlined above. No central canal stenosis. Right lateral recess stenosis at L4-5 that could affect the right L5 nerve. Left foraminal stenosis at L5-S1 that could affect the left L5 nerve. Previous left hemilaminectomy at L5-S1.   Electronically Signed   By: Nelson Chimes M.D.   On: 05/24/2017 12:59    Foot Imaging: Foot-L DG Complete:  Results for orders placed during the hospital encounter of 11/26/17  DG Foot Complete Left   Narrative CLINICAL DATA:  84 year old male with 2nd toe pain after injury last night and earlier this month.  EXAM: LEFT FOOT - COMPLETE 3+ VIEW  COMPARISON:  11/08/2017 right foot series  FINDINGS: Diastasis of the left 2nd DIP joint. Possible nondisplaced intra-articular fracture along the  lateral aspect of the base of the 2nd distal phalanx. No definite fracture of the 2nd middle phalanx.  Elsewhere stable radiographic appearance of the left foot tarsal bones appear intact and normally aligned.  IMPRESSION: 1. Diastasis of the left 2nd DIP joint with possible nondisplaced intra-articular fracture at the lateral base the distal phalanx. 2. No other acute fracture or dislocation identified about the left foot.   Electronically Signed   By: Genevie Ann M.D.   On: 11/26/2017 12:38    Complexity Note: Imaging results reviewed. Results shared with Mr. Langenbach, using Layman's terms. Today I personally and independently reviewed the study images pertinent to Mr. Arbuthnot's problem. Copy of results provided to patient.  Images of trial implant:            Meds   Current Outpatient Medications:  .  allopurinol (ZYLOPRIM) 100 MG tablet, Take 100 mg by mouth daily., Disp: , Rfl: 0 .  aspirin EC 81 MG tablet, Take 81 mg by mouth daily., Disp: , Rfl:  .  atorvastatin (LIPITOR) 10 MG tablet, Take 10 mg by mouth every evening., Disp: , Rfl: 0 .  citalopram (CELEXA) 20 MG tablet, Take 20 mg by mouth daily., Disp: , Rfl:  .  Cyanocobalamin 1000 MCG/ML LIQD, Take 1,000 mg by mouth daily., Disp: , Rfl:  .  diphenhydrAMINE (BENADRYL) 25 MG tablet, Take 25 mg by mouth every 6 (six) hours as needed., Disp: , Rfl:  .  fluticasone (FLONASE) 50 MCG/ACT nasal spray, fluticasone propionate 50 mcg/act susp, Disp: , Rfl:  .  gabapentin (NEURONTIN) 400 MG capsule, Take 400 mg by mouth 3 (three) times daily., Disp: , Rfl:  .  isosorbide dinitrate (ISORDIL) 30 MG tablet, Take 30 mg by mouth 3 (three) times daily., Disp: , Rfl:  .  levothyroxine (SYNTHROID, LEVOTHROID) 150 MCG tablet, Take 150 mcg by mouth every morning., Disp: , Rfl: 0 .  linaclotide (LINZESS) 290 MCG CAPS capsule, Take 290 mcg by mouth daily before breakfast., Disp: , Rfl:  .  losartan (COZAAR) 50 MG tablet, Take 50 mg  by mouth daily., Disp: , Rfl:  .  mirabegron ER (MYRBETRIQ) 25 MG TB24 tablet, Take 25 mg by mouth daily., Disp: , Rfl:  .  naloxone (NARCAN) nasal spray 4 mg/0.1 mL, Spray into one nostril. Repeat with second device into other nostril after 2-3 minutes if no or minimal response. Use in case of opioid overdose., Disp: 1 kit, Rfl: 0 .  ondansetron (ZOFRAN ODT) 4 MG disintegrating tablet, Take 1 tablet (4 mg total) by mouth every 8 (eight) hours as needed for nausea or vomiting., Disp: 20 tablet, Rfl: 0 .  [START ON 02/19/2018] oxyCODONE (ROXICODONE) 15 MG immediate release tablet, Take 1 tablet (15 mg total) by mouth every 6 (six) hours as needed for pain., Disp: 120 tablet, Rfl: 0 .  oxyCODONE (ROXICODONE) 15 MG immediate release tablet, Take 1 tablet (15 mg total) by mouth every 6 (six) hours as needed for pain., Disp: 120 tablet, Rfl: 0 .  pantoprazole (PROTONIX) 40 MG tablet, Take 40 mg by mouth 2 (two) times daily., Disp: , Rfl:  .  testosterone (ANDROGEL) 50 MG/5GM (1%) GEL, Place onto the skin daily., Disp: , Rfl:  .  vitamin E 400 UNIT capsule, Take 400 Units by mouth daily., Disp: , Rfl:  .  diazepam (VALIUM) 5 MG tablet, Take 1 tablet (5 mg total) by mouth as needed for up to 2 doses for anxiety (Take one tab 45 minutes before MRI. Take second tablet just prior to MRI scan). Do not take medication within 4 hours of taking opioid pain medications. Must have a driver. Do not drive or operate machinery x 24 hours after taking this medication. (Patient not taking: Reported on 02/18/2018), Disp: 2 tablet, Rfl: 0 .  omeprazole (PRILOSEC) 20 MG capsule, Take 20 mg by mouth daily., Disp: , Rfl:   ROS  Constitutional: Denies any fever or chills Gastrointestinal: No reported hemesis, hematochezia, vomiting, or acute GI distress Musculoskeletal: Denies any acute onset joint swelling, redness, loss of ROM, or weakness Neurological: No reported episodes of acute onset apraxia, aphasia, dysarthria, agnosia,  amnesia, paralysis, loss of coordination, or loss of consciousness  Allergies  Mr. Lightner is allergic to ibuprofen; sulfa antibiotics; sulfasalazine; and lidocaine.  [place]  Drug: Mr. Davidian  reports no history of drug use. Alcohol:  reports no history of alcohol use. Tobacco:  reports that he has been smoking cigars. He has never used smokeless tobacco. Medical:  has a past medical history of Acute encephalopathy (12/08/2014), Anxiety, ARF (acute renal failure) ([place]) (12/08/2014), Back pain, Benign neoplasm of large bowel, Capsulitis, Chronic back pain, Depression, Diabetes mellitus without complication ([place]), Dysphagia, Exostosis, GERD (gastroesophageal reflux disease), Gout, Hypothyroidism, Insomnia, Low testosterone, Microscopic hematuria, Pneumonia (12/07/2014), Pressure ulcer (12/09/2014), Sepsis ([place]) (12/08/2014), and Thyroid disease. Surgical: Mr. Wegman  has a past surgical history that includes Cholecystectomy; Appendectomy; Back surgery; Eye surgery; and Esophagogastroduodenoscopy (egd) with propofol (N/A, 04/24/2017). Family: family history includes Diabetes in his father; Heart disease in his mother.  Constitutional Exam  General appearance: Well nourished, well developed, and well hydrated. In no apparent acute distress Vitals:   02/18/18 1012  BP: (!) 149/79  Pulse: 72  Resp: 16  Temp: 98.2 F (36.8 C)  TempSrc: Oral  SpO2: 96%  Weight: 199 lb (90.3 kg)  Height: 6' (1.829 m)   BMI Assessment: Estimated body mass index is 26.99 kg/m as calculated from the following:   Height as of this encounter: 6' (1.829 m).   Weight as of this encounter: 199 lb (90.3 kg).  BMI interpretation table: BMI level Category Range association with higher incidence of chronic pain  <18 kg/m2 Underweight   18.5-24.9 kg/m2 Ideal body weight   25-29.9 kg/m2 Overweight Increased incidence by 20%  30-34.9 kg/m2 Obese (Class I) Increased incidence by 68%  35-39.9 kg/m2 Severe obesity (Class II)  Increased incidence by 136%  >40 kg/m2 Extreme obesity (Class III) Increased incidence by 254%   Patient's current BMI Ideal Body weight  Body mass index is 26.99 kg/m. Ideal body weight: 77.6 kg (171 lb 1.2 oz) Adjusted ideal body weight: 82.7 kg (182 lb 3.9 oz)   BMI Readings from Last 4 Encounters:  02/18/18 26.99 kg/m  01/25/18 26.45 kg/m  01/23/18 26.45 kg/m  01/17/18 26.45 kg/m   Wt Readings from Last 4 Encounters:  02/18/18 199 lb (90.3 kg)  01/25/18 195 lb (88.5 kg)  01/23/18 195 lb (88.5 kg)  01/17/18 195 lb (88.5 kg)  Psych/Mental status: Alert, oriented x 3 (person, place, & time)       Eyes: PERLA Respiratory: No evidence of acute respiratory distress  Cervical Spine Area Exam  Skin & Axial Inspection: No masses, redness, edema, swelling, or associated skin lesions Alignment: Symmetrical Functional ROM: Unrestricted ROM      Stability: No instability detected Muscle Tone/Strength: Functionally intact. No obvious neuro-muscular anomalies detected. Sensory (Neurological): Unimpaired Palpation: No palpable anomalies              Upper Extremity (UE) Exam    Side: Right upper extremity  Side: Left upper extremity  Skin & Extremity Inspection: Skin color, temperature, and hair growth are WNL. No peripheral edema or cyanosis. No masses, redness, swelling, asymmetry, or associated skin lesions. No contractures.  Skin & Extremity Inspection: Skin color, temperature, and hair growth are WNL. No peripheral edema or cyanosis. No masses, redness, swelling, asymmetry, or associated skin lesions. No contractures.  Functional ROM: Unrestricted ROM          Functional ROM: Unrestricted ROM          Muscle Tone/Strength: Functionally intact. No obvious neuro-muscular anomalies detected.  Muscle Tone/Strength: Functionally intact.  No obvious neuro-muscular anomalies detected.  Sensory (Neurological): Unimpaired          Sensory (Neurological): Unimpaired          Palpation: No  palpable anomalies              Palpation: No palpable anomalies              Provocative Test(s):  Phalen's test: deferred Tinel's test: deferred Apley's scratch test (touch opposite shoulder):  Action 1 (Across chest): deferred Action 2 (Overhead): deferred Action 3 (LB reach): deferred   Provocative Test(s):  Phalen's test: deferred Tinel's test: deferred Apley's scratch test (touch opposite shoulder):  Action 1 (Across chest): deferred Action 2 (Overhead): deferred Action 3 (LB reach): deferred    Thoracic Spine Area Exam  Skin & Axial Inspection: No masses, redness, or swelling Alignment: Symmetrical Functional ROM: Unrestricted ROM Stability: No instability detected Muscle Tone/Strength: Functionally intact. No obvious neuro-muscular anomalies detected. Sensory (Neurological): Unimpaired Muscle strength & Tone: No palpable anomalies  Lumbar Spine Area Exam  Skin & Axial Inspection: Well healed scar from previous spine surgery detected Alignment: Symmetrical Functional ROM: Decreased ROM affecting both sides Stability: No instability detected Muscle Tone/Strength: Increased muscle tone over affected area Sensory (Neurological): Movement-associated discomfort Palpation: Complains of area being tender to palpation       Provocative Tests: Hyperextension/rotation test: deferred today       Lumbar quadrant test (Kemp's test): deferred today       Lateral bending test: deferred today       Patrick's Maneuver: deferred today                   FABER* test: deferred today                   S-I anterior distraction/compression test: deferred today         S-I lateral compression test: deferred today         S-I Thigh-thrust test: deferred today         S-I Gaenslen's test: deferred today         *(Flexion, ABduction and External Rotation)  Gait & Posture Assessment  Ambulation: Patient ambulates using a cane Gait: Limited. Using assistive device to ambulate Posture:  Antalgic   Lower Extremity Exam    Side: Right lower extremity  Side: Left lower extremity  Stability: No instability observed          Stability: No instability observed          Skin & Extremity Inspection: Skin color, temperature, and hair growth are WNL. No peripheral edema or cyanosis. No masses, redness, swelling, asymmetry, or associated skin lesions. No contractures.  Skin & Extremity Inspection: Skin color, temperature, and hair growth are WNL. No peripheral edema or cyanosis. No masses, redness, swelling, asymmetry, or associated skin lesions. No contractures.  Functional ROM: Unrestricted ROM                  Functional ROM: Unrestricted ROM                  Muscle Tone/Strength: Functionally intact. No obvious neuro-muscular anomalies detected.  Muscle Tone/Strength: Functionally intact. No obvious neuro-muscular anomalies detected.  Sensory (Neurological): Unimpaired        Sensory (Neurological): Unimpaired        DTR: Patellar: deferred today Achilles: deferred today Plantar: deferred today  DTR: Patellar: deferred today Achilles: deferred today Plantar: deferred today  Palpation: No palpable  anomalies  Palpation: No palpable anomalies   Assessment  Primary Diagnosis & Pertinent Problem List: The primary encounter diagnosis was Chronic pain syndrome. Diagnoses of Chronic lower extremity pain (Primary Area of Pain) (Left), Chronic low back pain (Secondary Area of Pain) (Bilateral) (ML) (L>R) with sciatica (Left), Failed back surgical syndrome, DDD (degenerative disc disease), lumbar, Foot drop (Left), Lower limb pain, inferior, left (L5) (Foot Drop), and Lumbosacral radiculopathy at L5 were also pertinent to this visit.  Status Diagnosis  Persistent Persistent Persistent 1. Chronic pain syndrome   2. Chronic lower extremity pain (Primary Area of Pain) (Left)   3. Chronic low back pain (Secondary Area of Pain) (Bilateral) (ML) (L>R) with sciatica (Left)   4. Failed back  surgical syndrome   5. DDD (degenerative disc disease), lumbar   6. Foot drop (Left)   7. Lower limb pain, inferior, left (L5) (Foot Drop)   8. Lumbosacral radiculopathy at L5     Problems updated and reviewed during this visit: No problems updated. Plan of Care  Pharmacotherapy (Medications Ordered): No orders of the defined types were placed in this encounter.  Medications administered today: Evert Kohl. Lowder had no medications administered during this visit.  Procedure Orders    No procedure(s) ordered today   Lab Orders  No laboratory test(s) ordered today   Imaging Orders  No imaging studies ordered today   Referral Orders  No referral(s) requested today   Interventional management options: Planned, scheduled, and/or pending:   Referral to Neurosurgery for permanent SCS implant.    Considering:   Diagnostic caudal epidural steroid injection + epidurogram Possible Racz procedure Therapeutic left L5 transforaminal ESI  Diagnostic bilateral lumbar facet block Possible bilateral lumbar facet RFA Possible candidate for intrathecal pump trial and implant Possible candidate for spinal cord stimulator trial and implant   Palliative PRN treatment(s):   Diagnostic left-sided L4 and [id number]   Provider-requested follow-up: Return for Med-Mgmt, w/ Dionisio David, NP.  Future Appointments  Date Time Provider [place]  03/13/2018 11:00 AM Vevelyn Francois, NP [facility] None   Primary Care Physician: Jodi Marble, MD Location: [facility] Outpatient Pain Management Facility Note by: Gaspar Cola, MD Date: 02/18/2018; Time: 11:05 AM

## 2018-02-18 ENCOUNTER — Ambulatory Visit: Payer: Medicare Other | Attending: Pain Medicine | Admitting: Pain Medicine

## 2018-02-18 ENCOUNTER — Encounter: Payer: Self-pay | Admitting: Pain Medicine

## 2018-02-18 VITALS — BP 149/79 | HR 72 | Temp 98.2°F | Resp 16 | Ht 72.0 in | Wt 199.0 lb

## 2018-02-18 DIAGNOSIS — M79605 Pain in left leg: Secondary | ICD-10-CM | POA: Insufficient documentation

## 2018-02-18 DIAGNOSIS — M21372 Foot drop, left foot: Secondary | ICD-10-CM | POA: Diagnosis present

## 2018-02-18 DIAGNOSIS — M5442 Lumbago with sciatica, left side: Secondary | ICD-10-CM | POA: Diagnosis present

## 2018-02-18 DIAGNOSIS — M5417 Radiculopathy, lumbosacral region: Secondary | ICD-10-CM

## 2018-02-18 DIAGNOSIS — M961 Postlaminectomy syndrome, not elsewhere classified: Secondary | ICD-10-CM | POA: Diagnosis present

## 2018-02-18 DIAGNOSIS — M5136 Other intervertebral disc degeneration, lumbar region: Secondary | ICD-10-CM | POA: Insufficient documentation

## 2018-02-18 DIAGNOSIS — G894 Chronic pain syndrome: Secondary | ICD-10-CM | POA: Diagnosis not present

## 2018-02-18 DIAGNOSIS — G8929 Other chronic pain: Secondary | ICD-10-CM | POA: Insufficient documentation

## 2018-02-18 NOTE — Patient Instructions (Addendum)
Post-op Pain Management on a Chronic Pain Patient  Why should the surgeon manage his patient's post-op pain? The Surgeon is uniquely qualified to determine the amount of post-operative pain to be expected on a procedure or surgery that he/she has performed. Even with similar surgeries, the surgeon's perspective on expected pain is unique, since he/she performed the procedure and knows the degree of difficulty and/or tissue damage involved in each particular case. The surgeon is also up to date on events such as blood loss, intraoperative complications, and PO (per orum) status that may influence not only the patient's dose and schedule, but route of administration as well.  How about telling chronic pain patients to just double up or increase their usual pain medication intake to compensate for the increased pain? This is a bad idea since it will lead to the patient running out of his/her usual medications early and this may create a problem at the level of the insurance, which supplies medications based on the amount and schedule stated on the prescription. Running out early may trigger an event where the refill is denied by the insurance company and/or pharmacy. In addition, this practice provides a very poor paper trail as to why this patient ran out of medication early. In addition, from the perspective of the pain physician, it creates a nightmare in the accounting of the patient's medication.  So, what should I do as a Psychologist, sport and exercise when confronted with a patient that needs surgery and already takes a significant amount of pain medicine for their chronic pain, which may or may not be related to the surgery I have to perform?  This is what the surgeon should do: 1. Do not change the dose or schedule of the pain medications prescribed by the pain specialist. This medication regimen allows for the patient's chronic pain to be under control, so as to bring that patient down to the level of an average  individual. 2. Have the patient continue their usual pain management regimen, without any alterations. In addition, manage the post-operative pain as you would on any other "narcotic naive" patient. Do not attempt to compensate for tolerance. This is what the patient's usual regimen will do for you. Simply treat the patient as if they had no chronic pain and as if they were taking no other pain medications. 3. Talk to the patient about the medication, just like you would for anyone else. Do not assume that they are experts in opioids. Make sure you let the patient know that the medication is to be used only if absolutely necessary. (PRN) 4. Prescribe the medication for as long as you would on any other patient undergoing the same type of surgery. Prescribe for the same average amount of time that you would on any other patient. Avoid prescribing for longer periods. 5. Send [place] a copy of the operative report with information about your choice of the post-op pain medication provided. 6. Keep [place] informed of any complications that may prolong the average duration patient's post-op pain.  If you have any questions, please feel to contact us at [phone]. _____________________________________________________________________________________________  Surgeons letter given with [name] signature.

## 2018-02-18 NOTE — Progress Notes (Signed)
Safety precautions to be maintained throughout the outpatient stay will include: orient to surroundings, keep bed in low position, maintain call bell within reach at all times, provide assistance with transfer out of bed and ambulation.  

## 2018-02-21 ENCOUNTER — Other Ambulatory Visit: Payer: Self-pay | Admitting: Nurse Practitioner

## 2018-03-01 ENCOUNTER — Other Ambulatory Visit: Payer: Self-pay

## 2018-03-01 ENCOUNTER — Encounter
Admission: RE | Admit: 2018-03-01 | Discharge: 2018-03-01 | Disposition: A | Discharge status: Home / Self Care | Payer: Medicare Other | Source: Ambulatory Visit | Attending: Neurosurgery | Admitting: Neurosurgery

## 2018-03-01 DIAGNOSIS — I444 Left anterior fascicular block: Secondary | ICD-10-CM | POA: Diagnosis not present

## 2018-03-01 DIAGNOSIS — Z01818 Encounter for other preprocedural examination: Secondary | ICD-10-CM | POA: Insufficient documentation

## 2018-03-01 DIAGNOSIS — R9431 Abnormal electrocardiogram [ECG] [EKG]: Secondary | ICD-10-CM | POA: Insufficient documentation

## 2018-03-01 DIAGNOSIS — I4510 Unspecified right bundle-branch block: Secondary | ICD-10-CM | POA: Diagnosis not present

## 2018-03-01 DIAGNOSIS — I452 Bifascicular block: Secondary | ICD-10-CM | POA: Diagnosis not present

## 2018-03-01 HISTORY — DX: Acute myocardial infarction, unspecified: I21.9

## 2018-03-01 HISTORY — DX: Foot drop, left foot: M21.372

## 2018-03-01 HISTORY — DX: Essential (primary) hypertension: I10

## 2018-03-01 HISTORY — DX: Repeated falls: R29.6

## 2018-03-01 LAB — CBC
HCT: 45.1 % (ref 39.0–52.0)
Hemoglobin: 14.7 g/dL (ref 13.0–17.0)
MCH: 30.6 pg (ref 26.0–34.0)
MCHC: 32.6 g/dL (ref 30.0–36.0)
MCV: 94 fL (ref 80.0–100.0)
Platelets: 147 10*3/uL — ABNORMAL LOW (ref 150–400)
RBC: 4.8 MIL/uL (ref 4.22–5.81)
RDW: 13.2 % (ref 11.5–15.5)
WBC: 6.9 10*3/uL (ref 4.0–10.5)
nRBC: 0 % (ref 0.0–0.2)

## 2018-03-01 LAB — BASIC METABOLIC PANEL
Anion gap: 7 (ref 5–15)
BUN: 17 mg/dL (ref 8–23)
CO2: 29 mmol/L (ref 22–32)
Calcium: 9 mg/dL (ref 8.9–10.3)
Chloride: 104 mmol/L (ref 98–111)
Creatinine, Ser: 1.21 mg/dL (ref 0.61–1.24)
GFR calc Af Amer: >60 mL/min (ref >60)
GFR calc non Af Amer: 56 mL/min — ABNORMAL LOW (ref >60)
GLUCOSE: 89 mg/dL (ref 70–99)
Potassium: 4.1 mmol/L (ref 3.5–5.1)
Sodium: 140 mmol/L (ref 135–145)

## 2018-03-01 LAB — URINALYSIS, ROUTINE W REFLEX MICROSCOPIC
Bilirubin Urine: NEGATIVE
Glucose, UA: NEGATIVE mg/dL
Hgb urine dipstick: NEGATIVE
Ketones, ur: NEGATIVE mg/dL
LEUKOCYTES UA: NEGATIVE
Nitrite: NEGATIVE
PROTEIN: NEGATIVE mg/dL
Specific Gravity, Urine: 1.021 (ref 1.005–1.030)
pH: 5 (ref 5.0–8.0)

## 2018-03-01 LAB — SURGICAL PCR SCREEN
MRSA, PCR: NEGATIVE
Staphylococcus aureus: NEGATIVE

## 2018-03-01 LAB — PROTIME-INR
INR: 0.99
Prothrombin Time: 13 seconds (ref 11.4–15.2)

## 2018-03-01 LAB — APTT: aPTT: 32 seconds (ref 24–36)

## 2018-03-01 NOTE — Patient Instructions (Signed)
Your procedure is scheduled on: Wednesday, March 13, 2018  Report to [facility]     DO NOT STOP ON THE FIRST FLOOR TO REGISTER  To find out your arrival time please call [phone] between 1PM - 3PM on Tuesday, March 12, 2018  Remember: Instructions that are not followed completely may result in serious medical risk,  up to and including death, or upon the discretion of your surgeon and anesthesiologist your  surgery may need to be rescheduled.     _X__ 1. Do not eat food after midnight the night before your procedure.                 No gum chewing or hard candies.                     ABSOLUTELY NOTHING SOLID IN YOUR MOUTH AFTER SURGERY                 You may drink clear liquids up to 2 hours before you are scheduled to arrive for your surgery-                   DO not drink clear liquids within 2 hours of the start of your surgery.                   Clear Liquids include:  water, apple juice without pulp, clear carbohydrate                 drink such as Clearfast of Gatorade, Black Coffee or Tea (Do not add                 anything to coffee or tea). YOU MAY ADD SUGAR BUT NO DAIRY PRODUCTS  __X__2.  On the morning of surgery brush your teeth with toothpaste and water,                   You may rinse your mouth with mouthwash if you wish.                        Do not swallow any toothpaste of mouthwash.     _X__ 3.  No Alcohol for 24 hours before or after surgery.   _X__ 4.  Do Not Smoke or use e-cigarettes For 24 Hours Prior to Your Surgery.                 Do not use any chewable tobacco products for at least 6 hours prior to                 surgery.  ____  5.  Bring all medications with you on the day of surgery if instructed.   _X___  6.  Notify your doctor if there is any change in your medical condition      (cold, fever, infections).     Do not wear jewelry, make-up, hairpins, clips or nail polish. Do not wear  lotions, powders, or perfumes. You may wear deodorant. Do not shave 48 hours prior to surgery. Men may shave face and neck. Do not bring valuables to the hospital.    [facility] is not responsible for any belongings or valuables.  Contacts, dentures or bridgework may not be worn into surgery. Leave your suitcase in the car. After surgery it may be brought to your room. For patients admitted to the hospital, discharge time  is determined by your treatment team.   Patients discharged the day of surgery will not be allowed to drive home.   Please read over the following fact sheets that you were given:                     PREPARING FOR SURGERY  __X__ Take these medicines the morning of surgery with A SIP OF WATER:    1. ALLOPURINOL  2. CELEXA  3.  ALLEGRA  4. SYNTHROID  5. PROTONIX  6. IMDUR             7. GABAPENTIN             8. ROXICODONE             9. FLONASE, IF YOU NEED IT  ____ Fleet Enema (as directed)   __X_ Use CHG Soap as directed  ____ Use inhalers on the day of surgery  ____ Stop metformin 2 days prior to surgery    _X___ Stop ASPIRIN ONE WEEK BEFORE SURGERY, ON March 06, 2018  __X__ Stop Anti-inflammatories AS OF March 06, 2018   _X___ Stop supplements until after surgery.                    STOP VITAMIN E BUT YOU CAN CONTINUE VITAMIN B12.                    DO NOT TAKE B12 ONO THE DAY OF SURGERY  ____ Bring C-Pap to the hospital.   DO NOT TAKE LOSARTAN ON THE DAY OF SURGERY BUT DO TAKE    IT EVERY DAY UP UNTIL THEN. CONTINUE TO TAKE LIPITOR / MYRBETRIQ / LINZESS AS USUAL YOU MAY USE BENADRYL UP UNTIL THE DAY OF SURGERY.      DO NOT TAKE IT ON THE MORNING OF SURGERY  WEAR LOOSE AND COMFORTABLE TO [place].

## 2018-03-13 ENCOUNTER — Ambulatory Visit
Admission: RE | Admit: 2018-03-13 | Discharge: 2018-03-13 | Disposition: A | Discharge status: Home / Self Care | Payer: Medicare Other | Attending: Neurosurgery | Admitting: Neurosurgery

## 2018-03-13 ENCOUNTER — Ambulatory Visit: Payer: Medicare Other

## 2018-03-13 ENCOUNTER — Encounter: Payer: Self-pay | Admitting: Nurse Practitioner

## 2018-03-13 ENCOUNTER — Encounter
Admission: RE | Disposition: A | Discharge status: Home / Self Care | Payer: Self-pay | Source: Home / Self Care | Attending: Neurosurgery

## 2018-03-13 ENCOUNTER — Ambulatory Visit: Payer: Medicare Other | Admitting: Certified Registered"

## 2018-03-13 ENCOUNTER — Other Ambulatory Visit: Payer: Self-pay

## 2018-03-13 ENCOUNTER — Encounter: Payer: Self-pay | Admitting: Emergency Medicine

## 2018-03-13 DIAGNOSIS — I10 Essential (primary) hypertension: Secondary | ICD-10-CM | POA: Diagnosis not present

## 2018-03-13 DIAGNOSIS — R1310 Dysphagia, unspecified: Secondary | ICD-10-CM | POA: Diagnosis not present

## 2018-03-13 DIAGNOSIS — E785 Hyperlipidemia, unspecified: Secondary | ICD-10-CM | POA: Diagnosis not present

## 2018-03-13 DIAGNOSIS — G4700 Insomnia, unspecified: Secondary | ICD-10-CM | POA: Diagnosis not present

## 2018-03-13 DIAGNOSIS — Z7982 Long term (current) use of aspirin: Secondary | ICD-10-CM | POA: Insufficient documentation

## 2018-03-13 DIAGNOSIS — K219 Gastro-esophageal reflux disease without esophagitis: Secondary | ICD-10-CM | POA: Diagnosis not present

## 2018-03-13 DIAGNOSIS — E039 Hypothyroidism, unspecified: Secondary | ICD-10-CM | POA: Diagnosis not present

## 2018-03-13 DIAGNOSIS — Z886 Allergy status to analgesic agent status: Secondary | ICD-10-CM | POA: Insufficient documentation

## 2018-03-13 DIAGNOSIS — Z86010 Personal history of colonic polyps: Secondary | ICD-10-CM | POA: Insufficient documentation

## 2018-03-13 DIAGNOSIS — Z7951 Long term (current) use of inhaled steroids: Secondary | ICD-10-CM | POA: Diagnosis not present

## 2018-03-13 DIAGNOSIS — M109 Gout, unspecified: Secondary | ICD-10-CM | POA: Insufficient documentation

## 2018-03-13 DIAGNOSIS — F17290 Nicotine dependence, other tobacco product, uncomplicated: Secondary | ICD-10-CM | POA: Insufficient documentation

## 2018-03-13 DIAGNOSIS — Z79899 Other long term (current) drug therapy: Secondary | ICD-10-CM | POA: Insufficient documentation

## 2018-03-13 DIAGNOSIS — M961 Postlaminectomy syndrome, not elsewhere classified: Secondary | ICD-10-CM | POA: Diagnosis present

## 2018-03-13 DIAGNOSIS — J449 Chronic obstructive pulmonary disease, unspecified: Secondary | ICD-10-CM | POA: Diagnosis not present

## 2018-03-13 DIAGNOSIS — Z882 Allergy status to sulfonamides status: Secondary | ICD-10-CM | POA: Diagnosis not present

## 2018-03-13 DIAGNOSIS — Z419 Encounter for procedure for purposes other than remedying health state, unspecified: Secondary | ICD-10-CM

## 2018-03-13 DIAGNOSIS — E119 Type 2 diabetes mellitus without complications: Secondary | ICD-10-CM | POA: Diagnosis not present

## 2018-03-13 DIAGNOSIS — I252 Old myocardial infarction: Secondary | ICD-10-CM | POA: Insufficient documentation

## 2018-03-13 DIAGNOSIS — F419 Anxiety disorder, unspecified: Secondary | ICD-10-CM | POA: Diagnosis not present

## 2018-03-13 DIAGNOSIS — Z8249 Family history of ischemic heart disease and other diseases of the circulatory system: Secondary | ICD-10-CM | POA: Insufficient documentation

## 2018-03-13 DIAGNOSIS — F329 Major depressive disorder, single episode, unspecified: Secondary | ICD-10-CM | POA: Diagnosis not present

## 2018-03-13 DIAGNOSIS — Z833 Family history of diabetes mellitus: Secondary | ICD-10-CM | POA: Diagnosis not present

## 2018-03-13 HISTORY — PX: SPINAL CORD STIMULATOR INSERTION: SHX5378

## 2018-03-13 LAB — GLUCOSE, CAPILLARY
Glucose-Capillary: 121 mg/dL — ABNORMAL HIGH (ref 70–99)
Glucose-Capillary: 158 mg/dL — ABNORMAL HIGH (ref 70–99)

## 2018-03-13 SURGERY — INSERTION, SPINAL CORD STIMULATOR, LUMBAR
Anesthesia: General | Site: Back

## 2018-03-13 MED ORDER — CEFAZOLIN SODIUM-DEXTROSE 2-4 GM/100ML-% IV SOLN
2.0000 g | Freq: Once | INTRAVENOUS | Status: AC
  Administered 2018-03-13: 2 g via INTRAVENOUS

## 2018-03-13 MED ORDER — SODIUM CHLORIDE FLUSH 0.9 % IV SOLN
INTRAVENOUS | Status: AC
  Filled 2018-03-13: qty 30

## 2018-03-13 MED ORDER — TRAMADOL HCL 50 MG PO TABS: 50.0000 mg | ORAL_TABLET | Freq: Four times a day (QID) | ORAL | 0 refills | Status: AC | PRN

## 2018-03-13 MED ORDER — FENTANYL CITRATE (PF) 100 MCG/2ML IJ SOLN
25.0000 ug | INTRAMUSCULAR | Status: DC | PRN
  Administered 2018-03-13: 25 ug via INTRAVENOUS

## 2018-03-13 MED ORDER — THROMBIN 5000 UNITS EX SOLR
CUTANEOUS | Status: AC
  Filled 2018-03-13: qty 5000

## 2018-03-13 MED ORDER — BACITRACIN 50000 UNITS IM SOLR
INTRAMUSCULAR | Status: AC
  Filled 2018-03-13: qty 1

## 2018-03-13 MED ORDER — CEFAZOLIN SODIUM-DEXTROSE 2-4 GM/100ML-% IV SOLN
INTRAVENOUS | Status: AC
  Filled 2018-03-13: qty 100

## 2018-03-13 MED ORDER — GELATIN ABSORBABLE 12-7 MM EX MISC
CUTANEOUS | Status: AC
  Filled 2018-03-13: qty 1

## 2018-03-13 MED ORDER — SODIUM CHLORIDE 0.9 % IR SOLN
Status: DC | PRN
  Administered 2018-03-13: 1000 mL

## 2018-03-13 MED ORDER — TRAMADOL HCL 50 MG PO TABS
ORAL_TABLET | ORAL | Status: AC
  Filled 2018-03-13: qty 1

## 2018-03-13 MED ORDER — TRAMADOL HCL 50 MG PO TABS
50.0000 mg | ORAL_TABLET | Freq: Four times a day (QID) | ORAL | Status: DC
  Administered 2018-03-13: 50 mg via ORAL

## 2018-03-13 MED ORDER — SUCCINYLCHOLINE CHLORIDE 20 MG/ML IJ SOLN
INTRAMUSCULAR | Status: DC | PRN
  Administered 2018-03-13: 100 mg via INTRAVENOUS

## 2018-03-13 MED ORDER — ROCURONIUM BROMIDE 100 MG/10ML IV SOLN
INTRAVENOUS | Status: DC | PRN
  Administered 2018-03-13: 15 mg via INTRAVENOUS
  Administered 2018-03-13: 10 mg via INTRAVENOUS
  Administered 2018-03-13: 5 mg via INTRAVENOUS

## 2018-03-13 MED ORDER — GLYCOPYRROLATE 0.2 MG/ML IJ SOLN
INTRAMUSCULAR | Status: AC
  Filled 2018-03-13: qty 1

## 2018-03-13 MED ORDER — SUCCINYLCHOLINE CHLORIDE 20 MG/ML IJ SOLN
INTRAMUSCULAR | Status: AC
  Filled 2018-03-13: qty 1

## 2018-03-13 MED ORDER — DEXAMETHASONE SODIUM PHOSPHATE 10 MG/ML IJ SOLN
INTRAMUSCULAR | Status: AC
  Filled 2018-03-13: qty 1

## 2018-03-13 MED ORDER — ROCURONIUM BROMIDE 50 MG/5ML IV SOLN
INTRAVENOUS | Status: AC
  Filled 2018-03-13: qty 1

## 2018-03-13 MED ORDER — KETAMINE HCL 50 MG/ML IJ SOLN
INTRAMUSCULAR | Status: DC | PRN
  Administered 2018-03-13: 25 mg via INTRAVENOUS

## 2018-03-13 MED ORDER — PHENYLEPHRINE HCL 10 MG/ML IJ SOLN
INTRAMUSCULAR | Status: AC
  Filled 2018-03-13: qty 1

## 2018-03-13 MED ORDER — ONDANSETRON HCL 4 MG/2ML IJ SOLN
INTRAMUSCULAR | Status: AC
  Filled 2018-03-13: qty 2

## 2018-03-13 MED ORDER — VANCOMYCIN HCL 10 G IV SOLR
1500.0000 mg | Freq: Once | INTRAVENOUS | Status: AC
  Administered 2018-03-13: 1500 mg via INTRAVENOUS
  Filled 2018-03-13: qty 1500

## 2018-03-13 MED ORDER — ACETAMINOPHEN 10 MG/ML IV SOLN
INTRAVENOUS | Status: DC | PRN
  Administered 2018-03-13: 1000 mg via INTRAVENOUS

## 2018-03-13 MED ORDER — BUPIVACAINE HCL (PF) 0.5 % IJ SOLN
INTRAMUSCULAR | Status: AC
  Filled 2018-03-13: qty 30

## 2018-03-13 MED ORDER — FENTANYL CITRATE (PF) 100 MCG/2ML IJ SOLN
INTRAMUSCULAR | Status: AC
  Filled 2018-03-13: qty 2

## 2018-03-13 MED ORDER — LIDOCAINE HCL (PF) 2 % IJ SOLN
INTRAMUSCULAR | Status: AC
  Filled 2018-03-13: qty 10

## 2018-03-13 MED ORDER — FENTANYL CITRATE (PF) 100 MCG/2ML IJ SOLN
INTRAMUSCULAR | Status: DC | PRN
  Administered 2018-03-13: 100 ug via INTRAVENOUS
  Administered 2018-03-13 (×2): 50 ug via INTRAVENOUS

## 2018-03-13 MED ORDER — DEXAMETHASONE SODIUM PHOSPHATE 10 MG/ML IJ SOLN
INTRAMUSCULAR | Status: DC | PRN
  Administered 2018-03-13: 10 mg via INTRAVENOUS

## 2018-03-13 MED ORDER — METHOCARBAMOL 500 MG PO TABS: 500.0000 mg | ORAL_TABLET | Freq: Four times a day (QID) | ORAL | 0 refills | Status: DC | PRN

## 2018-03-13 MED ORDER — EPHEDRINE SULFATE 50 MG/ML IJ SOLN
INTRAMUSCULAR | Status: DC | PRN
  Administered 2018-03-13 (×2): 10 mg via INTRAVENOUS

## 2018-03-13 MED ORDER — SODIUM CHLORIDE 0.9 % IV SOLN
INTRAVENOUS | Status: DC
  Administered 2018-03-13: 07:00:00 via INTRAVENOUS

## 2018-03-13 MED ORDER — PROPOFOL 10 MG/ML IV BOLUS
INTRAVENOUS | Status: AC
  Filled 2018-03-13: qty 20

## 2018-03-13 MED ORDER — PROPOFOL 10 MG/ML IV BOLUS
INTRAVENOUS | Status: DC | PRN
  Administered 2018-03-13: 50 mg via INTRAVENOUS
  Administered 2018-03-13: 100 mg via INTRAVENOUS

## 2018-03-13 MED ORDER — BUPIVACAINE-EPINEPHRINE (PF) 0.5% -1:200000 IJ SOLN
INTRAMUSCULAR | Status: DC | PRN
  Administered 2018-03-13: 8 mL

## 2018-03-13 MED ORDER — BUPIVACAINE-EPINEPHRINE (PF) 0.5% -1:200000 IJ SOLN
INTRAMUSCULAR | Status: AC
  Filled 2018-03-13: qty 30

## 2018-03-13 MED ORDER — THROMBIN 5000 UNITS EX SOLR
CUTANEOUS | Status: DC | PRN
  Administered 2018-03-13: 5000 [IU] via TOPICAL

## 2018-03-13 MED ORDER — GELATIN ABSORBABLE 12-7 MM EX MISC
CUTANEOUS | Status: DC | PRN
  Administered 2018-03-13: 1

## 2018-03-13 MED ORDER — EPHEDRINE SULFATE 50 MG/ML IJ SOLN
INTRAMUSCULAR | Status: AC
  Filled 2018-03-13: qty 1

## 2018-03-13 MED ORDER — GLYCOPYRROLATE 0.2 MG/ML IJ SOLN
INTRAMUSCULAR | Status: DC | PRN
  Administered 2018-03-13: 0.2 mg via INTRAVENOUS

## 2018-03-13 MED ORDER — BUPIVACAINE LIPOSOME 1.3 % IJ SUSP
INTRAMUSCULAR | Status: AC
  Filled 2018-03-13: qty 20

## 2018-03-13 MED ORDER — KETAMINE HCL 50 MG/ML IJ SOLN
INTRAMUSCULAR | Status: AC
  Filled 2018-03-13: qty 10

## 2018-03-13 MED ORDER — ONDANSETRON HCL 4 MG/2ML IJ SOLN: 4.0000 mg | Freq: Once | INTRAMUSCULAR | Status: DC | PRN

## 2018-03-13 SURGICAL SUPPLY — 56 items
BUR NEURO DRILL SOFT 3.0X3.8M (BURR) ×3 IMPLANT
CANISTER SUCT 1200ML W/VALVE (MISCELLANEOUS) ×6 IMPLANT
CHLORAPREP W/TINT 26ML (MISCELLANEOUS) ×6 IMPLANT
COUNTER NEEDLE 20/40 LG (NEEDLE) ×3 IMPLANT
COVER LIGHT HANDLE STERIS (MISCELLANEOUS) ×6 IMPLANT
COVER WAND RF STERILE (DRAPES) ×3 IMPLANT
CUP MEDICINE 2OZ PLAST GRAD ST (MISCELLANEOUS) ×3 IMPLANT
DERMABOND ADVANCED (GAUZE/BANDAGES/DRESSINGS) ×4
DERMABOND ADVANCED .7 DNX12 (GAUZE/BANDAGES/DRESSINGS) ×2 IMPLANT
DEVICE IMPLANT NEUROSTIMINATOR (Neuro Prosthesis/Implant) ×1 IMPLANT
DEVICE IMPLANT NEUROSTIMULATOR (Neuro Prosthesis/Implant) ×2 IMPLANT
DRAPE C-ARM 42X72 X-RAY (DRAPES) ×3 IMPLANT
DRAPE LAPAROTOMY 100X77 ABD (DRAPES) ×3 IMPLANT
DRAPE MICROSCOPE SPINE 48X150 (DRAPES) IMPLANT
DRAPE SURG 17X11 SM STRL (DRAPES) ×18 IMPLANT
ELECT CAUTERY BLADE TIP 2.5 (TIP) ×3
ELECT REM PT RETURN 9FT ADLT (ELECTROSURGICAL) ×3
ELECTRODE CAUTERY BLDE TIP 2.5 (TIP) ×1 IMPLANT
ELECTRODE REM PT RTRN 9FT ADLT (ELECTROSURGICAL) ×1 IMPLANT
ENVELOPE ABSORB ANTIBACTERIAL (Neuro Prosthesis/Implant) ×3 IMPLANT
FEE INTRAOP MONITOR IMPULS NCS (MISCELLANEOUS) IMPLANT
GLOVE BIOGEL PI IND STRL 7.0 (GLOVE) ×1 IMPLANT
GLOVE BIOGEL PI INDICATOR 7.0 (GLOVE) ×2
GLOVE SURG SYN 7.0 (GLOVE) ×6 IMPLANT
GLOVE SURG SYN 8.5  E (GLOVE) ×6
GLOVE SURG SYN 8.5 E (GLOVE) ×3 IMPLANT
GOWN SRG XL LVL 3 NONREINFORCE (GOWNS) ×1 IMPLANT
GOWN STRL NON-REIN TWL XL LVL3 (GOWNS) ×2
GOWN STRL REUS W/TWL MED LVL3 (GOWN DISPOSABLE) ×3 IMPLANT
GRADUATE 1200CC STRL 31836 (MISCELLANEOUS) ×3 IMPLANT
INTRAOP MONITOR FEE IMPULS NCS (MISCELLANEOUS)
INTRAOP MONITOR FEE IMPULSE (MISCELLANEOUS)
KIT TURNOVER KIT A (KITS) ×3 IMPLANT
MARKER SKIN DUAL TIP RULER LAB (MISCELLANEOUS) ×3 IMPLANT
NDL SAFETY ECLIPSE 18X1.5 (NEEDLE) ×1 IMPLANT
NEEDLE HYPO 18GX1.5 SHARP (NEEDLE) ×2
NEEDLE HYPO 22GX1.5 SAFETY (NEEDLE) ×3 IMPLANT
NS IRRIG 1000ML POUR BTL (IV SOLUTION) ×3 IMPLANT
PACK LAMINECTOMY NEURO (CUSTOM PROCEDURE TRAY) ×3 IMPLANT
PASSER CATH 38CM DISP (INSTRUMENTS) ×3 IMPLANT
RECHARGER INTELLIS (NEUROSURGERY SUPPLIES) ×3 IMPLANT
REPROGRAMMER INTELLIS (NEUROSURGERY SUPPLIES) ×3 IMPLANT
SPOGE SURGIFLO 8M (HEMOSTASIS)
SPONGE SURGIFLO 8M (HEMOSTASIS) IMPLANT
STAPLER SKIN PROX 35W (STAPLE) IMPLANT
STIMULATOR CORD SURESCAN MRI (Stimulator) ×3 IMPLANT
SUT SILK 2 0SH CR/8 30 (SUTURE) ×3 IMPLANT
SUT V-LOC 90 ABS DVC 3-0 CL (SUTURE) ×6 IMPLANT
SUT VIC AB 0 CT1 18XCR BRD 8 (SUTURE) ×1 IMPLANT
SUT VIC AB 0 CT1 8-18 (SUTURE) ×2
SUT VIC AB 2-0 CT1 18 (SUTURE) ×3 IMPLANT
SYR 10ML LL (SYRINGE) ×3 IMPLANT
SYR 30ML LL (SYRINGE) ×6 IMPLANT
TOWEL OR 17X26 4PK STRL BLUE (TOWEL DISPOSABLE) ×9 IMPLANT
TUBING CONNECTING 10 (TUBING) ×2 IMPLANT
TUBING CONNECTING 10' (TUBING) ×1

## 2018-03-13 NOTE — Discharge Instructions (Addendum)
NEUROSURGERY DISCHARGE INSTRUCTIONS   Operative procedure: Spinal cord stimulator implantation  The following are instructions to help in your recovery once you have been discharged from the hospital. Even if you feel well, it is important that you follow these activity guidelines. If you do not let your neck heal properly from the surgery, you can increase the chance of return of your symptoms and other complications.   What to do after you leave the hospital:  Recommended diet:  Increase protein intake to promote wound healing. You may return to your usual diet. However, you may experience discomfort when swallowing in the first month after your surgery. This is normal. You may find that softer foods are more comfortable for you to swallow. Be sure to stay hydrated.   Recommended activity: No bending, lifting, or twisting (BLT). Avoid lifting objects heavier than 10 pounds (gallon milk jug). Where possible, avoid household activities that involve lifting, bending, reaching, pushing, or pulling such as laundry, vacuuming, grocery shopping, and childcare. Try to arrange for help from friends and family for these activities while your back heals.   Increase physical activity slowly as tolerated. Taking short walks is encouraged, but avoid strenuous exercise. Do not jog, run, bicycle, lift weights, or participate in any other exercises unless specifically allowed by your doctor.   You should not drive until cleared by your doctor.   Until released by your doctor, you should not return to work or school. You should rest at home and let your body heal.   You may shower the day after your surgery. After showering, lightly dab your incision dry. Do not take a tub bath or go swimming until approved by your doctor at your follow-up appointment.   If you smoke, we strongly recommend that you quit. Smoking has been proven to interfere with normal bone healing and will dramatically reduce the success rate  of your surgery. Please contact [facility] (800-QUIT-NOW) and use the resources at www.[facility].com for assistance in stopping smoking.   Medications  Do not restart Aspirin until seven days after surgery  You may restart home medications.   Wound Care Instructions  Remove dressing on Saturday. You may shower, but do not allow incision sites to get wet.   Keep your incision area clean and dry.   If you have staples or stitches on your incision, you should have a follow up scheduled for removal. If you do not have staples or stitches, you will have steri-strips (small pieces of surgical tape) or Dermabond glue. The steri-strips/glue should begin to peel away within about a week (it is fine if the steri-strips fall off before then). If the strips are still in place one week after your surgery, you may gently remove them.    Please Report any of the following: Should you experience any of the following, contact us immediately:   New numbness or weakness   Pain that is progressively getting worse, and is not relieved by your pain medication, muscle relaxers, rest, and warm compresses   Bleeding, redness, swelling, pain, or drainage from surgical incision   Chills or flu-like symptoms   Fever greater than 101.0 F (38.3 C)   Inability to eat, drink fluids, or take medications   Problems with bowel or bladder functions   Difficulty breathing or shortness of breath   Warmth, tenderness, or swelling in your calf    Additional Follow up appointments During office hours (Monday-Friday 9 am to 5 pm), please call your physician at [phone]  After hours and weekends, please call the [facility] at  [phone] and ask for the Neurosurgeon On Call   For a life-threatening emergency, call [place]   1) The drugs that you were given will stay in your system until tomorrow so for the next 24 hours you should  not:  A) Drive an automobile B) Make any legal decisions C) Drink any alcoholic beverage   2) You may resume regular meals tomorrow.  Today it is better to start with liquids and gradually work up to solid foods.  You may eat anything you prefer, but it is better to start with liquids, then soup and crackers, and gradually work up to solid foods.   3) Please notify your doctor immediately if you have any unusual bleeding, trouble breathing, redness and pain at the surgery site, drainage, fever, or pain not relieved by medication.    4) Additional Instructions:        Please contact your physician with any problems or Same Day Surgery at [phone], Monday through Friday 6 am to 4 pm, or [place] at [facility] number at [phone].

## 2018-03-13 NOTE — Op Note (Signed)
Indications: the patient is a 84 yo male who was diagnosed with postlaminectomy syndrome. The patient had a successful trial for spinal cord stimulation, so was consented for placement of a permanent device   Findings: successful placement of a Medtronic Intellis (with surescan) spinal cord stimulator. Model numbers [id number] and [id number] implanted   Preoperative Diagnosis: Post laminectomy pain syndrome Postoperative Diagnosis: same     EBL: 50 ml IVF: 800 ml Drains: none Disposition: Extubated and Stable to PACU Complications: none   No foley catheter was placed.     Preoperative Note:    Risks of surgery discussed in clinic.   Operative Note:      The patient was then brought from the preoperative center with intravenous access established.  The patient underwent general anesthesia and endotracheal tube intubation, then was rotated on the [facility] table where all pressure points were appropriately padded.  An incision was marked with flouroscopy at T9/10, and on the buttock. The skin was then thoroughly cleansed.  Perioperative antibiotic prophylaxis was administered.  Sterile prep and drapes were then applied and a timeout was then observed.     Once this was complete an incision was opened with the use of a #10 blade knife in the midline at the thoracic incision.  The paraspinus muscled were subperiosteally dissected until the laminae of T9 and T10 were visualized. Flouroscopy was used to confirm the level. A self-retaining retractor was placed.   The rongeur was used to remove the spinous process of T9.  The drill was used to thin the bone until the ligamentum flavum was visualized.  The ligamentum was then removed and the dura visualized. This was widened until approximately 12 mm of dura was exposed to allow for placement of the paddle lead.     The lead was then advanced to the mid T8 vertebral body at the top of the lead.  The lead was secured with a 2-0 silk suture. Due to patient  anatomy, multiple attempts were required to obtain reasonable posterior spinal cord coverage.   The incision on the left buttock was then opened and a pocket formed until it was large enough for the pulse generator.  The tunneler was used to connect between the pocket and the incision.  The lead was inserted into the tunneler and tunneled to the buttock.  The leads were attached to the IPG and impedances checked.  The leads were then tightened.  The generator was inserted into a TYRX antibacterial envelope and inserted.   Both sites were irrigated.  The wounds were closed in layers with 0 and 2-0 vicryl.  The skin was approximated with monocryl and staples, then dressed      Patient was then rotated back to the preoperative bed awakened from anesthesia and taken to recovery all counts are correct in this case.   I performed the entire procedure with the assistance of Marin Olp PA as an Pensions consultant.     Meade Maw MD

## 2018-03-13 NOTE — Progress Notes (Signed)
Vancomycin 1500 mg (~15 mg/kg) and cefazolin 2 grams x1 ordered for surgical prophylaxis per consult.

## 2018-03-13 NOTE — Transfer of Care (Signed)
Immediate Anesthesia Transfer of Care Note  Patient: Chase Mendez  Procedure(s) Performed: SPINAL CORD STIMULATOR INSERTION (N/A Back)  Patient Location: PACU  Anesthesia Type:General  Level of Consciousness: awake, alert  and oriented  Airway & Oxygen Therapy: Patient Spontanous Breathing and Patient connected to face mask oxygen  Post-op Assessment: Report given to RN, Post -op Vital signs reviewed and stable and Patient moving all extremities X 4  Post vital signs: Reviewed  Last Vitals:  Vitals Value Taken Time  BP 174/81 03/13/2018 10:44 AM  Temp 36.5 C 03/13/2018 10:44 AM  Pulse 81 03/13/2018 10:44 AM  Resp 15 03/13/2018 10:44 AM  SpO2 98 % 03/13/2018 10:44 AM  Vitals shown include unvalidated device data.  Last Pain:  Vitals:   03/13/18 0627  TempSrc: Oral  PainSc: 4          Complications: No apparent anesthesia complications

## 2018-03-13 NOTE — Anesthesia Post-op Follow-up Note (Signed)
Anesthesia QCDR form completed.        

## 2018-03-13 NOTE — Anesthesia Preprocedure Evaluation (Signed)
Anesthesia Evaluation  Patient identified by MRN, date of birth, ID band Patient awake    Reviewed: Allergy & Precautions, H&P , NPO status , Patient's Chart, lab work & pertinent test results, reviewed documented beta blocker date and time   Airway Mallampati: II  TM Distance: >3 FB Neck ROM: full    Dental no notable dental hx. (+) Teeth Intact   Pulmonary neg pulmonary ROS, pneumonia, Current Smoker,    Pulmonary exam normal breath sounds clear to auscultation       Cardiovascular Exercise Tolerance: Good hypertension, On Medications + Past MI  negative cardio ROS   Rhythm:regular Rate:Normal     Neuro/Psych  Neuromuscular disease negative neurological ROS  negative psych ROS   GI/Hepatic negative GI ROS, Neg liver ROS, GERD  Medicated,  Endo/Other  negative endocrine ROSdiabetesHypothyroidism   Renal/GU Renal disease     Musculoskeletal   Abdominal   Peds  Hematology negative hematology ROS (+)   Anesthesia Other Findings   Reproductive/Obstetrics negative OB ROS                             Anesthesia Physical Anesthesia Plan  ASA: III  Anesthesia Plan: MAC   Post-op Pain Management:    Induction:   PONV Risk Score and Plan:   Airway Management Planned:   Additional Equipment:   Intra-op Plan:   Post-operative Plan:   Informed Consent: I have reviewed the patients History and Physical, chart, labs and discussed the procedure including the risks, benefits and alternatives for the proposed anesthesia with the patient or authorized representative who has indicated his/her understanding and acceptance.       Plan Discussed with: CRNA  Anesthesia Plan Comments:         Anesthesia Quick Evaluation

## 2018-03-13 NOTE — H&P (Signed)
Referring Physician:  No referring provider defined for this encounter.  Primary Physician:  Jodi Marble, MD  Chief Complaint:  Left leg pain   History of Present Illness: Chase Mendez is a 84 y.o. male who presents for spinal cord stimulator placement.  Initial visit with Dr. Izora Ribas occurred approximately 2 weeks ago and patient states symptoms have remained unchanged except for worsening pain.  He completed spinal cord stimulator trial with significant benefit to symptoms.  Pain is most prominent in left lower extremity but also complains of intermittent left lumbar pain.  Denies any lower extremity numbness or tingling but he does express left foot weakness that is been present for about 1 year.  Discontinued anticoagulants approximately 2 weeks ago.  Denies any recent illness or fevers.  Initial HPI copied from Dr. Nelly Laurence note on 02/21/2018: Chase Mendez is here today with a chief complaint of left lateral leg pain. He has been referred for a spinal cord stimulator after a successful trial with Medtronic.  He underwent surgery in 1995 in the 1996 with what sounds like microdiscectomies. He got better after that but then subsequently developed worsening pain that has been ongoing for more than a decade. Some of his symptoms have been going on since at least the time of his second surgery. Here describes stabbing and grabbing pain. It can be as bad as 6 out of 10 is made worse by standing, sitting, and walking. Nothing is really helped other than laying down.   Review of Systems:  A 10 point review of systems is negative, except for the pertinent positives and negatives detailed in the HPI.  Past Medical History: Past Medical History:  Diagnosis Date  . Acute encephalopathy 12/08/2014  . Anxiety   . ARF (acute renal failure) ([place]) 12/08/2014  . Back pain   . Benign neoplasm of large bowel   . Capsulitis    fractured displaced metatarsal with capsulitis  . Chronic  back pain   . Depression   . Diabetes mellitus without complication (HCC)    no medications currently  . Dysphagia   . Exostosis    painful, right hallux  . Foot drop, left    wears a brace  . Frequent falls 02/2018   poor balance, foot drop  . GERD (gastroesophageal reflux disease)   . Gout   . Hypertension   . Hypothyroidism   . Insomnia   . Low testosterone   . Microscopic hematuria 2016  . Myocardial infarction [facility])    patient unaware when it happened years ago.    . Pneumonia 12/07/2014  . Pressure ulcer 12/09/2014  . Sepsis ([place]) 12/08/2014  . Thyroid disease     Past Surgical History: Past Surgical History:  Procedure Laterality Date  . APPENDECTOMY    . [place]   x 2  . CHOLECYSTECTOMY    . ESOPHAGOGASTRODUODENOSCOPY (EGD) WITH PROPOFOL N/A 04/24/2017   Procedure: ESOPHAGOGASTRODUODENOSCOPY (EGD) WITH PROPOFOL;  Surgeon: Manya Silvas, MD;  Location: [facility] ENDOSCOPY;  Service: Endoscopy;  Laterality: N/A;  . EYE SURGERY Bilateral 1983, 1985   cataract extractions    Allergies: Allergies as of 02/25/2018 - Review Complete 02/18/2018  Allergen Reaction Noted  . Ibuprofen Nausea Only 09/04/2013  . Sulfa antibiotics  12/07/2014  . Sulfasalazine  04/23/2017  . Lidocaine Nausea Only     Medications:  Current Facility-Administered Medications:  .  0.9 %  sodium chloride infusion, , Intravenous, Continuous, Gunnar Bulla, MD, Last  Rate: 100 mL/hr at 03/13/18 0109 .  ceFAZolin (ANCEF) 2-4 GM/100ML-% IVPB, , , ,  .  ceFAZolin (ANCEF) IVPB 2g/100 mL premix, 2 g, Intravenous, Once, Meade Maw, MD .  vancomycin (VANCOCIN) 1,500 mg in sodium chloride 0.9 % 500 mL IVPB, 1,500 mg, Intravenous, Once, Meade Maw, MD   Social History: Social History   Tobacco Use  . Smoking status: Current Every Day Smoker    Types: Cigars  . Smokeless tobacco: Never Used  Substance Use Topics  . Alcohol use: No  . Drug use: No    Family  Medical History: Family History  Problem Relation Age of Onset  . Heart disease Mother   . Diabetes Father     Physical Examination: Vitals:   03/13/18 0627  BP: 131/72  Pulse: (!) 57  Resp: 17  Temp: 98.6 F (37 C)  SpO2: 96%     General: Patient is well developed, well nourished, calm, collected, and in no apparent distress.  Psychiatric: Patient is non-anxious.  ENT:  Oral mucosa appears well hydrated.  Neck:   Supple.  Full range of motion.  Respiratory: Patient is breathing without any difficulty.  Sounds clear to auscultation bilaterally  Heart:  RRR  Skin:   On exposed skin, there are no abnormal skin lesions.  NEUROLOGICAL:  General: In no acute distress.   Awake, alert, oriented to person, place, and time.  Facial tone is symmetric.   Strength: Side Biceps Triceps Deltoid Grip  R 5 5 5 5   L 5 5 5 5    Side Iliopsoas Quads Hamstring PF DF EHL  R 5 5 5 5 5 5   L 5 5 5 5 3 3     Assessment and Plan: Mr. Hathorne is a pleasant 84 y.o. male who presents for spinal cord stimulator placement.  No anticoagulation for approximately 2 weeks.  No recent illness or fevers.  Would prefer to have battery placed on the left since he sleeps on his right side.  Consent signed and dated.  Site marked.  All questions and concerns addressed.  Marin Olp, PA-C Dept. of Neurosurgery

## 2018-03-13 NOTE — Anesthesia Procedure Notes (Signed)
Procedure Name: Intubation Date/Time: 03/13/2018 8:30 AM Performed by: Rolla Plate, CRNA Pre-anesthesia Checklist: Patient identified, Patient being monitored, Timeout performed, Emergency Drugs available and Suction available Patient Re-evaluated:Patient Re-evaluated prior to induction Oxygen Delivery Method: Circle system utilized Preoxygenation: Pre-oxygenation with 100% oxygen Induction Type: IV induction Ventilation: Mask ventilation without difficulty Laryngoscope Size: Miller and 2 Grade View: Grade I Tube type: Oral Tube size: 7.5 mm Number of attempts: 1 Airway Equipment and Method: Stylet Placement Confirmation: ETT inserted through vocal cords under direct vision,  positive ETCO2 and breath sounds checked- equal and bilateral Secured at: 22 cm Tube secured with: Tape Dental Injury: Teeth and Oropharynx as per pre-operative assessment

## 2018-03-15 NOTE — Anesthesia Postprocedure Evaluation (Signed)
Anesthesia Post Note  Patient: Chase Mendez  Procedure(s) Performed: SPINAL CORD STIMULATOR INSERTION (N/A Back)  Patient location during evaluation: PACU Anesthesia Type: General Level of consciousness: awake and alert Pain management: pain level controlled Vital Signs Assessment: post-procedure vital signs reviewed and stable Respiratory status: spontaneous breathing, nonlabored ventilation, respiratory function stable and patient connected to nasal cannula oxygen Cardiovascular status: blood pressure returned to baseline and stable Postop Assessment: no apparent nausea or vomiting Anesthetic complications: no     Last Vitals:  Vitals:   03/13/18 1153 03/13/18 1312  BP: (!) 166/78 (!) 145/60  Pulse: 67 70  Resp: 18 18  Temp: (!) 36.3 C   SpO2: 94% 95%    Last Pain:  Vitals:   03/14/18 0807  TempSrc:   PainSc: 7                  Molli Barrows

## 2018-03-21 ENCOUNTER — Encounter: Payer: Self-pay | Admitting: Nurse Practitioner

## 2018-03-21 ENCOUNTER — Other Ambulatory Visit: Payer: Self-pay

## 2018-03-21 ENCOUNTER — Ambulatory Visit: Payer: Medicare Other | Attending: Nurse Practitioner | Admitting: Nurse Practitioner

## 2018-03-21 VITALS — BP 139/64 | HR 71 | Temp 98.1°F | Ht 72.0 in | Wt 204.0 lb

## 2018-03-21 DIAGNOSIS — G8929 Other chronic pain: Secondary | ICD-10-CM | POA: Insufficient documentation

## 2018-03-21 DIAGNOSIS — M5136 Other intervertebral disc degeneration, lumbar region: Secondary | ICD-10-CM | POA: Diagnosis present

## 2018-03-21 DIAGNOSIS — G894 Chronic pain syndrome: Secondary | ICD-10-CM

## 2018-03-21 DIAGNOSIS — M5417 Radiculopathy, lumbosacral region: Secondary | ICD-10-CM | POA: Diagnosis not present

## 2018-03-21 DIAGNOSIS — M79605 Pain in left leg: Secondary | ICD-10-CM | POA: Diagnosis present

## 2018-03-21 DIAGNOSIS — M5442 Lumbago with sciatica, left side: Secondary | ICD-10-CM | POA: Insufficient documentation

## 2018-03-21 MED ORDER — OXYCODONE HCL 15 MG PO TABS: 15.0000 mg | ORAL_TABLET | Freq: Four times a day (QID) | ORAL | 0 refills | Status: DC | PRN

## 2018-03-21 NOTE — Progress Notes (Signed)
Patient's Name: Chase Mendez  MRN: [id number]  Referring Provider: Jodi Marble, MD  DOB: 02-04-1935  PCP: Jodi Marble, MD  DOS: 03/21/2018  Note by: Dionisio David, NP  Service setting: Ambulatory outpatient  Specialty: Interventional Pain Management  Location: [facility] (AMB) Pain Management Facility    Patient type: Established   HPI  Reason for Visit: Chase Mendez is a 84 y.o. year old, male patient, who comes today with a chief complaint of Back Pain Last Appointment: His last appointment at our practice was on 02/21/2018. I last saw him on 02/21/2018.  Pain Assessment: Today, Chase Mendez describes the severity of the Chronic pain as a 3 /10. He indicates the location/referral of the pain to be Back Lower/down the left leg. Onset was: More than a month ago. The quality of pain is described as Aching. Temporal description, or timing of pain is: Constant. Possible modifying factors: medication, laying down, stimulator. Chase Mendez  height is 6' (1.829 m) and weight is 204 lb (92.5 kg). His temperature is 98.1 F (36.7 C). His blood pressure is 139/64 and his pulse is 71. His oxygen saturation is 94%. He is status post implant of a spinal cord stimulator.  We will overall is now able to wait 7 hours before having to use pain medication.  His wife did verbalize it on last night they charged the battery to 70% when he woke up this morning it was down to 20%.  He is concerned if this is normal.  She admits that she will call [employer] representative to meet with him on next Thursday at Dr. Nelly Laurence office.  Controlled Substance Pharmacotherapy Assessment REMS (Risk Evaluation and Mitigation Strategy)  Analgesic:Oxycodone IR 15 mg p.o. 4 times daily MME/day:74m/day Chase Fischer RN  03/21/2018  8:34 AM  Sign when Signing Visit Nursing Pain Medication Assessment:  Safety precautions to be maintained throughout the outpatient stay will include: orient to surroundings, keep bed in  low position, maintain call bell within reach at all times, provide assistance with transfer out of bed and ambulation.  Medication Inspection Compliance: Pill count conducted under aseptic conditions, in front of the patient. Neither the pills nor the bottle was removed from the patient's sight at any time. Once count was completed pills were immediately returned to the patient in their original bottle.  Medication: Oxycodone IR Pill/Patch Count: 15 of 120 pills remain Pill/Patch Appearance: Markings consistent with prescribed medication Bottle Appearance: Standard pharmacy container. Clearly labeled. Filled Date: 1 / 110/ 2020 Last Medication intake:  Today   Pharmacokinetics: Liberation and absorption (onset of action): WNL Distribution (time to peak effect): WNL Metabolism and excretion (duration of action): WNL         Pharmacodynamics: Desired effects: Analgesia: Mr. RWisenbakerreports >50% benefit. Functional ability: Patient reports that medication allows him to accomplish basic ADLs Clinically meaningful improvement in function (CMIF): Sustained CMIF goals met Perceived effectiveness: Described as relatively effective, allowing for increase in activities of daily living (ADL) Undesirable effects: Side-effects or Adverse reactions: None reported Monitoring: [place] PMP: Online review of the past 138-montheriod conducted. Compliant with practice rules and regulations Last UDS on record: Summary  Date Value Ref Range Status  11/14/2017 FINAL  Final    Comment:    ==================================================================== TOXASSURE SELECT 13 (MW) ==================================================================== Test  Result       Flag       Units Drug Present and Declared for Prescription Verification   Oxycodone                      4937         EXPECTED   ng/mg creat   Oxymorphone                    1650         EXPECTED   ng/mg creat    Noroxycodone                   6664         EXPECTED   ng/mg creat   Noroxymorphone                 520          EXPECTED   ng/mg creat    Sources of oxycodone are scheduled prescription medications.    Oxymorphone, noroxycodone, and noroxymorphone are expected    metabolites of oxycodone. Oxymorphone is also available as a    scheduled prescription medication. ==================================================================== Test                      Result    Flag   Units      Ref Range   Creatinine              139              mg/dL      >=20 ==================================================================== Declared Medications:  The flagging and interpretation on this report are based on the  following declared medications.  Unexpected results may arise from  inaccuracies in the declared medications.  **Note: The testing scope of this panel includes these medications:  Oxycodone  **Note: The testing scope of this panel does not include following  reported medications:  Allopurinol  Aspirin (Aspirin 81)  Atorvastatin  Citalopram  Cyanocobalamin  Diphenhydramine  Fluticasone  Gabapentin  Isosorbide  Levothyroxine  Linaclotide  Losartan (Losartan Potassium)  Mirabegron  Naloxone  Omeprazole  Ondansetron  Testosterone  Vitamin E ==================================================================== For clinical consultation, please call [phone]. ====================================================================    UDS interpretation: Compliant          Medication Assessment Form: Reviewed. Patient indicates being compliant with therapy Treatment compliance: Compliant Risk Assessment Profile: Aberrant behavior: See initial evaluations. None observed or detected today Comorbid factors increasing risk of overdose: See initial evaluation. No additional risks detected today Opioid risk tool (ORT):  Opioid Risk  03/21/2018  Alcohol 0  Illegal Drugs 0  Rx  Drugs 0  Alcohol 3  Illegal Drugs 0  Rx Drugs 0  Age between 16-45 years  0  History of Preadolescent Sexual Abuse 0  Psychological Disease 2  ADD Negative  OCD Negative  Bipolar Negative  Depression 1  Opioid Risk Tool Scoring 6  Opioid Risk Interpretation Moderate Risk    ORT Scoring interpretation table:  Score <3 = Low Risk for SUD  Score between 4-7 = Moderate Risk for SUD  Score >8 = High Risk for Opioid Abuse   Risk of substance use disorder (SUD): Moderate  Risk Mitigation Strategies:  Patient Counseling: Covered Patient-Prescriber Agreement (PPA): Present and active  Notification to other healthcare providers: Done  Pharmacologic Plan: No change in therapy, at this time.  ROS  Constitutional: Denies any fever or chills Gastrointestinal: No reported hemesis, hematochezia, vomiting, or acute GI distress Musculoskeletal: Denies any acute onset joint swelling, redness, loss of ROM, or weakness Neurological: No reported episodes of acute onset apraxia, aphasia, dysarthria, agnosia, amnesia, paralysis, loss of coordination, or loss of consciousness  Medication Review  Testosterone, acetaminophen, allopurinol, atorvastatin, citalopram, diphenhydrAMINE, fexofenadine, fluticasone, gabapentin, isosorbide mononitrate, levothyroxine, linaclotide, losartan, methocarbamol, mirabegron ER, naloxone, ondansetron, oxyCODONE, pantoprazole, vitamin B-12, and vitamin E  History Review  Allergy: Chase Mendez is allergic to ibuprofen; sulfa antibiotics; and sulfasalazine. Drug: Chase Mendez  reports no history of drug use. Alcohol:  reports no history of alcohol use. Tobacco:  reports that he has been smoking cigars. He has never used smokeless tobacco. Social: Chase Mendez  reports that he has been smoking cigars. He has never used smokeless tobacco. He reports that he does not drink alcohol or use drugs. Medical:  has a past medical history of Acute encephalopathy (12/08/2014),  Anxiety, ARF (acute renal failure) ([place]) (12/08/2014), Back pain, Benign neoplasm of large bowel, Capsulitis, Chronic back pain, Depression, Diabetes mellitus without complication ([place]), Dysphagia, Exostosis, Foot drop, left, Frequent falls (02/2018), GERD (gastroesophageal reflux disease), Gout, Hypertension, Hypothyroidism, Insomnia, Low testosterone, Microscopic hematuria (2016), Myocardial infarction ([place]), Pneumonia (12/07/2014), Pressure ulcer (12/09/2014), Sepsis ([employer]) (12/08/2014), and Thyroid disease. Surgical: Chase Mendez  has a past surgical history that includes Cholecystectomy; Esophagogastroduodenoscopy (egd) with propofol (N/A, 04/24/2017); Back surgery (1995, 1996); Eye surgery (Bilateral, 1983, 1985); Appendectomy; and Spinal cord stimulator insertion (N/A, 03/13/2018). Family: family history includes Diabetes in his father; Heart disease in his mother. Problem List: Chase Mendez.  Lab Review  Kidney Function Lab Results  Component Value Date   BUN 17 03/01/2018   CREATININE 1.21 03/01/2018   GFRAA >60 03/01/2018   GFRNONAA 56 (L) 03/01/2018  Liver Function Lab Results  Component Value Date   AST 28 08/21/2016   ALT 18 08/21/2016   ALBUMIN 4.1 08/21/2016  Note: Above Lab results reviewed.  Imaging Review  DG C-Arm 1-60 Min CLINICAL DATA:  Thoracic spine intraspinal stimulator placement  EXAM: OPERATIVE THORACIC SPINE 7 VIEW(S)  COMPARISON:  Chest radiograph 01/25/2017  FLUOROSCOPY TIME:  0 minutes 43 seconds  Dose: 20.85 mGy  FINDINGS: Images demonstrate placement of an intraspinal stimulator at the dorsal spinal canal in the lower cervical spine.  Limited landmarks makes it difficult to assign segment levels.  Severe osseous demineralization and scattered endplate spurring.  IMPRESSION: Intraspinal stimulator at inferior thoracic spine.  Electronically Signed   By: Lavonia Dana M.D.   On: 03/13/2018 11:19 DG  Thoracic Spine 1 View CLINICAL DATA:  Thoracic spine intraspinal stimulator placement  EXAM: OPERATIVE THORACIC SPINE 7 VIEW(S)  COMPARISON:  Chest radiograph 01/25/2017  FLUOROSCOPY TIME:  0 minutes 43 seconds  Dose: 20.85 mGy  FINDINGS: Images demonstrate placement of an intraspinal stimulator at the dorsal spinal canal in the lower cervical spine.  Limited landmarks makes it difficult to assign segment levels.  Severe osseous demineralization and scattered endplate spurring.  IMPRESSION: Intraspinal stimulator at inferior thoracic spine.  Electronically Signed   By: Lavonia Dana M.D.   On: 03/13/2018 11:19 DG C-Arm 1-60 Min CLINICAL DATA:  Thoracic spine intraspinal stimulator placement  EXAM: OPERATIVE THORACIC SPINE 7 VIEW(S)  COMPARISON:  Chest radiograph 01/25/2017  FLUOROSCOPY TIME:  0 minutes 43 seconds  Dose: 20.85 mGy  FINDINGS: Images demonstrate placement of an intraspinal stimulator at the dorsal  spinal canal in the lower cervical spine.  Limited landmarks makes it difficult to assign segment levels.  Severe osseous demineralization and scattered endplate spurring.  IMPRESSION: Intraspinal stimulator at inferior thoracic spine.  Electronically Signed   By: Lavonia Dana M.D.   On: 03/13/2018 11:19 Note: Above imaging results reviewed.        Physical Exam  General appearance: Well nourished, well developed, and well hydrated. In no apparent acute distress Mental status: Alert, oriented x 3 (person, place, & time)       Respiratory: No evidence of acute respiratory distress Eyes: PERLA Vitals: BP 139/64   Pulse 71   Temp 98.1 F (36.7 C)   Ht 6' (1.829 m)   Wt 204 lb (92.5 kg)   SpO2 94%   BMI 27.67 kg/m   BMI: Estimated body mass index is 27.67 kg/m as calculated from the following:   Height as of this encounter: 6' (1.829 m).   Weight as of this encounter: 204 lb (92.5 kg). Ideal: Ideal body weight: 77.6 kg (171 lb 1.2 oz) Adjusted  ideal body weight: 83.6 kg (184 lb 3.9 oz) Thoracic Spine Area Exam  Skin & Axial Inspection: Surgical scar with staples visible Alignment: Symmetrical Functional ROM: Unrestricted ROM Stability: No instability detected Muscle Tone/Strength: Functionally intact. No obvious neuro-muscular anomalies detected. Sensory (Neurological): Unimpaired Muscle strength & Tone: Uncomfortable Lumbar Spine Area Exam  Skin & Axial Inspection: No masses, redness, or swelling Alignment: Symmetrical Functional ROM: Unrestricted ROM       Stability: No instability detected Muscle Tone/Strength: Functionally intact. No obvious neuro-muscular anomalies detected. Sensory (Neurological): Unimpaired  Palpation: Uncomfortable      Surgical scar to the left with staples visible SCS palpable  Assessment   Status Diagnosis  Controlled Controlled Controlled 1. Lumbosacral radiculopathy at L5   2. Lower limb pain, inferior, left (L5) (Foot Drop)   3. DDD (degenerative disc disease), lumbar   4. Chronic low back pain (Secondary Area of Pain) (Bilateral) (ML) (L>R) with sciatica (Left)   5. Chronic pain syndrome      Updated Problems: No problems updated.  Plan of Care  Medications: I have changed Chase Mendez. [place]'s oxyCODONE, oxyCODONE, and oxyCODONE. I am also having him maintain his allopurinol, levothyroxine, atorvastatin, ondansetron, fluticasone, citalopram, linaclotide, vitamin E, losartan, mirabegron ER, diphenhydrAMINE, naloxone, gabapentin, pantoprazole, fexofenadine, isosorbide mononitrate, vitamin B-12, Testosterone, acetaminophen, and methocarbamol.  Administered today: Chase Mendez. Mendez had no medications administered during this visit.  Orders:  No orders of the defined types were placed in this encounter.  Interventional management options: Planned, scheduled, and/or pending: Not at this time.   Considering: Diagnostic caudal epidural steroid injection + epidurogram Possible Racz  procedure Diagnostic bilateral lumbar facet block Possible bilateral lumbar facet RFA Possible candidate for intrathecal pump trial and implant Possible candidate for spinal cord stimulator trial and implant   Palliative PRN treatment(s): Diagnostic left-sided L4 and [id number]#2    Note by: Dionisio David, NP Date: 03/21/2018; Time: 2:33 PM

## 2018-03-21 NOTE — Patient Instructions (Signed)
____________________________________________________________________________________________  Medication Rules  Purpose: To inform patients, and their family members, of our rules and regulations.  Applies to: All patients receiving prescriptions (written or electronic).  Pharmacy of record: Pharmacy where electronic prescriptions will be sent. If written prescriptions are taken to a different pharmacy, please inform the nursing staff. The pharmacy listed in the electronic medical record should be the one where you would like electronic prescriptions to be sent.  Electronic prescriptions: In compliance with the  Strengthen Opioid Misuse Prevention (STOP) Act of 2017 (Session Law 2017-74/H243), effective February 06, 2018, all controlled substances must be electronically prescribed. Calling prescriptions to the pharmacy will cease to exist.  Prescription refills: Only during scheduled appointments. Applies to all prescriptions.  NOTE: The following applies primarily to controlled substances (Opioid* Pain Medications).   Patient's responsibilities: 1. Pain Pills: Bring all pain pills to every appointment (except for procedure appointments). 2. Pill Bottles: Bring pills in original pharmacy bottle. Always bring the newest bottle. Bring bottle, even if empty. 3. Medication refills: You are responsible for knowing and keeping track of what medications you take and those you need refilled. The day before your appointment: write a list of all prescriptions that need to be refilled. The day of the appointment: give the list to the admitting nurse. Prescriptions will be written only during appointments. No prescriptions will be written on procedure days. If you forget a medication: it will not be "Called in", "Faxed", or "electronically sent". You will need to get another appointment to get these prescribed. No early refills. Do not call asking to have your prescription filled  early. 4. Prescription Accuracy: You are responsible for carefully inspecting your prescriptions before leaving our office. Have the discharge nurse carefully go over each prescription with you, before taking them home. Make sure that your name is accurately spelled, that your address is correct. Check the name and dose of your medication to make sure it is accurate. Check the number of pills, and the written instructions to make sure they are clear and accurate. Make sure that you are given enough medication to last until your next medication refill appointment. 5. Taking Medication: Take medication as prescribed. When it comes to controlled substances, taking less pills or less frequently than prescribed is permitted and encouraged. Never take more pills than instructed. Never take medication more frequently than prescribed.  6. Inform other Doctors: Always inform, all of your healthcare providers, of all the medications you take. 7. Pain Medication from other Providers: You are not allowed to accept any additional pain medication from any other Doctor or Healthcare provider. There are two exceptions to this rule. (see below) In the event that you require additional pain medication, you are responsible for notifying us, as stated below. 8. Medication Agreement: You are responsible for carefully reading and following our Medication Agreement. This must be signed before receiving any prescriptions from our practice. Safely store a copy of your signed Agreement. Violations to the Agreement will result in no further prescriptions. (Additional copies of our Medication Agreement are available upon request.) 9. Laws, Rules, & Regulations: All patients are expected to follow all Federal and State Laws, Statutes, Rules, & Regulations. Ignorance of the Laws does not constitute a valid excuse. The use of any illegal substances is prohibited. 10. Adopted CDC guidelines & recommendations: Target dosing levels will be  at or below 60 MME/day. Use of benzodiazepines** is not recommended.  Exceptions: There are only two exceptions to the rule of not   receiving pain medications from other Healthcare Providers. 1. Exception #1 (Emergencies): In the event of an emergency (i.e.: accident requiring emergency care), you are allowed to receive additional pain medication. However, you are responsible for: As soon as you are able, call our office [phone], at any time of the day or night, and leave a message stating your name, the date and nature of the emergency, and the name and dose of the medication prescribed. In the event that your call is answered by a member of our staff, make sure to document and save the date, time, and the name of the person that took your information.  2. Exception #2 (Planned Surgery): In the event that you are scheduled by another doctor or dentist to have any type of surgery or procedure, you are allowed (for a period no longer than 30 days), to receive additional pain medication, for the acute post-op pain. However, in this case, you are responsible for picking up a copy of our "Post-op Pain Management for Surgeons" handout, and giving it to your surgeon or dentist. This document is available at our office, and does not require an appointment to obtain it. Simply go to our office during business hours (Monday-Thursday from 8:00 AM to 4:00 PM) (Friday 8:00 AM to 12:00 Noon) or if you have a scheduled appointment with us, prior to your surgery, and ask for it by name. In addition, you will need to provide us with your name, name of your surgeon, type of surgery, and date of procedure or surgery.  *Opioid medications include: morphine, codeine, oxycodone, oxymorphone, hydrocodone, hydromorphone, meperidine, tramadol, tapentadol, buprenorphine, fentanyl, methadone. **Benzodiazepine medications include: diazepam (Valium), alprazolam (Xanax), clonazepam (Klonopine), lorazepam (Ativan), clorazepate  (Tranxene), chlordiazepoxide (Librium), estazolam (Prosom), oxazepam (Serax), temazepam (Restoril), triazolam (Halcion) (Last updated: 04/05/2017) ____________________________________________________________________________________________    

## 2018-03-21 NOTE — Progress Notes (Signed)
Nursing Pain Medication Assessment:  Safety precautions to be maintained throughout the outpatient stay will include: orient to surroundings, keep bed in low position, maintain call bell within reach at all times, provide assistance with transfer out of bed and ambulation.  Medication Inspection Compliance: Pill count conducted under aseptic conditions, in front of the patient. Neither the pills nor the bottle was removed from the patient's sight at any time. Once count was completed pills were immediately returned to the patient in their original bottle.  Medication: Oxycodone IR Pill/Patch Count: 15 of 120 pills remain Pill/Patch Appearance: Markings consistent with prescribed medication Bottle Appearance: Standard pharmacy container. Clearly labeled. Filled Date: 1 / 54 / 2020 Last Medication intake:  Today

## 2018-05-20 ENCOUNTER — Other Ambulatory Visit: Payer: Self-pay

## 2018-05-21 ENCOUNTER — Inpatient Hospital Stay: Payer: Medicare Other | Admitting: Oncology

## 2018-05-21 ENCOUNTER — Other Ambulatory Visit: Payer: Self-pay

## 2018-05-21 ENCOUNTER — Encounter: Payer: Self-pay | Admitting: Oncology

## 2018-06-12 ENCOUNTER — Inpatient Hospital Stay: Payer: Medicare Other | Admitting: Oncology

## 2018-06-18 ENCOUNTER — Ambulatory Visit: Payer: PRIVATE HEALTH INSURANCE | Attending: Nurse Practitioner | Admitting: Nurse Practitioner

## 2018-06-18 ENCOUNTER — Other Ambulatory Visit: Payer: Self-pay

## 2018-06-18 DIAGNOSIS — M5136 Other intervertebral disc degeneration, lumbar region: Secondary | ICD-10-CM

## 2018-06-18 DIAGNOSIS — G894 Chronic pain syndrome: Secondary | ICD-10-CM

## 2018-06-18 DIAGNOSIS — M5417 Radiculopathy, lumbosacral region: Secondary | ICD-10-CM | POA: Diagnosis not present

## 2018-06-18 DIAGNOSIS — M79605 Pain in left leg: Secondary | ICD-10-CM | POA: Diagnosis not present

## 2018-06-18 MED ORDER — OXYCODONE HCL 15 MG PO TABS: 15.0000 mg | ORAL_TABLET | Freq: Four times a day (QID) | ORAL | 0 refills | Status: DC | PRN

## 2018-06-18 NOTE — Patient Instructions (Signed)
____________________________________________________________________________________________  Medication Rules  Purpose: To inform patients, and their family members, of our rules and regulations.  Applies to: All patients receiving prescriptions (written or electronic).  Pharmacy of record: Pharmacy where electronic prescriptions will be sent. If written prescriptions are taken to a different pharmacy, please inform the nursing staff. The pharmacy listed in the electronic medical record should be the one where you would like electronic prescriptions to be sent.  Electronic prescriptions: In compliance with the [place] Strengthen Opioid Misuse Prevention (STOP) Act of 2017 (Session Law 2017-74/H243), effective February 06, 2018, all controlled substances must be electronically prescribed. Calling prescriptions to the pharmacy will cease to exist.  Prescription refills: Only during scheduled appointments. Applies to all prescriptions.  NOTE: The following applies primarily to controlled substances (Opioid* Pain Medications).   Patient's responsibilities: 1. Pain Pills: Bring all pain pills to every appointment (except for procedure appointments). 2. Pill Bottles: Bring pills in original pharmacy bottle. Always bring the newest bottle. Bring bottle, even if empty. 3. Medication refills: You are responsible for knowing and keeping track of what medications you take and those you need refilled. The day before your appointment: write a list of all prescriptions that need to be refilled. The day of the appointment: give the list to the admitting nurse. Prescriptions will be written only during appointments. No prescriptions will be written on procedure days. If you forget a medication: it will not be "Called in", "Faxed", or "electronically sent". You will need to get another appointment to get these prescribed. No early refills. Do not call asking to have your prescription filled  early. 4. Prescription Accuracy: You are responsible for carefully inspecting your prescriptions before leaving our office. Have the discharge nurse carefully go over each prescription with you, before taking them home. Make sure that your name is accurately spelled, that your address is correct. Check the name and dose of your medication to make sure it is accurate. Check the number of pills, and the written instructions to make sure they are clear and accurate. Make sure that you are given enough medication to last until your next medication refill appointment. 5. Taking Medication: Take medication as prescribed. When it comes to controlled substances, taking less pills or less frequently than prescribed is permitted and encouraged. Never take more pills than instructed. Never take medication more frequently than prescribed.  6. Inform other Doctors: Always inform, all of your healthcare providers, of all the medications you take. 7. Pain Medication from other Providers: You are not allowed to accept any additional pain medication from any other Doctor or Healthcare provider. There are two exceptions to this rule. (see below) In the event that you require additional pain medication, you are responsible for notifying us, as stated below. 8. Medication Agreement: You are responsible for carefully reading and following our Medication Agreement. This must be signed before receiving any prescriptions from our practice. Safely store a copy of your signed Agreement. Violations to the Agreement will result in no further prescriptions. (Additional copies of our Medication Agreement are available upon request.) 9. Laws, Rules, & Regulations: All patients are expected to follow all Federal and State Laws, Statutes, Rules, & Regulations. Ignorance of the Laws does not constitute a valid excuse. The use of any illegal substances is prohibited. 10. Adopted CDC guidelines & recommendations: Target dosing levels will be  at or below 60 MME/day. Use of benzodiazepines** is not recommended.  Exceptions: There are only two exceptions to the rule of not   receiving pain medications from other Healthcare Providers. 1. Exception #1 (Emergencies): In the event of an emergency (i.e.: accident requiring emergency care), you are allowed to receive additional pain medication. However, you are responsible for: As soon as you are able, call our office [phone], at any time of the day or night, and leave a message stating your name, the date and nature of the emergency, and the name and dose of the medication prescribed. In the event that your call is answered by a member of our staff, make sure to document and save the date, time, and the name of the person that took your information.  2. Exception #2 (Planned Surgery): In the event that you are scheduled by another doctor or dentist to have any type of surgery or procedure, you are allowed (for a period no longer than 30 days), to receive additional pain medication, for the acute post-op pain. However, in this case, you are responsible for picking up a copy of our "Post-op Pain Management for Surgeons" handout, and giving it to your surgeon or dentist. This document is available at our office, and does not require an appointment to obtain it. Simply go to our office during business hours (Monday-Thursday from 8:00 AM to 4:00 PM) (Friday 8:00 AM to 12:00 Noon) or if you have a scheduled appointment with us, prior to your surgery, and ask for it by name. In addition, you will need to provide us with your name, name of your surgeon, type of surgery, and date of procedure or surgery.  *Opioid medications include: morphine, codeine, oxycodone, oxymorphone, hydrocodone, hydromorphone, meperidine, tramadol, tapentadol, buprenorphine, fentanyl, methadone. **Benzodiazepine medications include: diazepam (Valium), alprazolam (Xanax), clonazepam (Klonopine), lorazepam (Ativan), clorazepate  (Tranxene), chlordiazepoxide (Librium), estazolam (Prosom), oxazepam (Serax), temazepam (Restoril), triazolam (Halcion) (Last updated: 04/05/2017) ____________________________________________________________________________________________    

## 2018-06-18 NOTE — Progress Notes (Signed)
Pain Management Encounter Note - Virtual Visit via Telephone Telehealth (real-time audio visits between healthcare provider and patient).  Patient's Phone No. & Preferred Pharmacy:  [phone] (home); There is no such number on file (mobile).; (Preferred) [phone]  [facility] [place], [place] - [address] [place] [address] Phone: [phone] Fax: [phone]  [place], [place] - [address] [place] [address] Phone: [phone] Fax: [phone]   Pre-screening note:  Our staff contacted Chase Mendez and offered him an "in person", "face-to-face" appointment versus a telephone encounter. He indicated preferring the telephone encounter, at this time.  Reason for Virtual Visit: COVID-19*  Social distancing based on CDC and AMA recommendations.   I contacted Chase Mendez on 06/18/2018 at 9:29 AM by telephone and clearly identified myself as Dionisio David, NP. I verified that I was speaking with the correct person using two identifiers (Name and date of birth: 12-07-1934).  Advanced Informed Consent I sought verbal advanced consent from Chase Mendez for telemedicine interactions and virtual visit. I informed Chase Mendez of the security and privacy concerns, risks, and limitations associated with performing an evaluation and management service by telephone. I also informed Chase Mendez of the availability of "in person" appointments and I informed him of the possibility of a patient responsible charge related to this service. Chase Mendez expressed understanding and agreed to proceed.   Historic Elements   Chase Mendez is a 84 y.o. year old, male patient evaluated today after his last encounter by our practice on 03/21/2018. Chase Mendez  has a past medical history of Acute encephalopathy (12/08/2014), Anxiety, ARF (acute renal failure) ([place]) (12/08/2014), Back pain, Benign neoplasm of large  bowel, Capsulitis, Chronic back pain, Depression, Diabetes mellitus without complication ([place]), Dysphagia, Exostosis, Foot drop, left, Frequent falls (02/2018), GERD (gastroesophageal reflux disease), Gout, Hypertension, Hypothyroidism, Insomnia, Low testosterone, Microscopic hematuria (2016), Myocardial infarction ([place]), Pneumonia (12/07/2014), Pressure ulcer (12/09/2014), Sepsis ([place]) (12/08/2014), and Thyroid disease. He also  has a past surgical history that includes Cholecystectomy; Esophagogastroduodenoscopy (egd) with propofol (N/A, 04/24/2017); Back surgery (1995, 1996); Eye surgery (Bilateral, 1983, 1985); Appendectomy; and Spinal cord stimulator insertion (N/A, 03/13/2018). Chase Mendez has a current medication list which includes the following prescription(s): acetaminophen, allopurinol, aspirin, atorvastatin, citalopram, diphenhydramine, fluticasone, gabapentin, isosorbide mononitrate, levothyroxine, linaclotide, losartan, methocarbamol, mirabegron er, naloxone, ondansetron, oxycodone, oxycodone, oxycodone, pantoprazole, testosterone, vitamin b-12, and vitamin e. He  reports that he has been smoking cigars. He has a 35.00 pack-year smoking history. He has never used smokeless tobacco. He reports current drug use. Drug: Oxycodone. He reports that he does not drink alcohol. Chase Mendez is allergic to ibuprofen; sulfa antibiotics; and sulfasalazine.   HPI  I last saw him on 03/21/2018. He is being evaluated for medication management. He has 6/10 left leg pain including hip and buttock. He feels like the pain is getting worse. He admits that the SCS and the pain medication is effective of his pain control. He denies any numbness or tingling. He has sharp pain. He has not reach out to the [employer]. He denies any side effects.   Pharmacotherapy Assessment  Analgesic:Oxycodone IR 15 mg p.o. 4 times daily MME/day:90mg /day Monitoring: Pharmacotherapy: No side-effects or adverse reactions reported. [place] PMP:  PDMP reviewed during this encounter.       Compliance: No problems identified. Plan: Refer to "POC".  Review of recent tests  DG C-Arm 1-60 Min  CLINICAL DATA:  Thoracic spine intraspinal stimulator placement  EXAM: OPERATIVE THORACIC SPINE 7 VIEW(S)  COMPARISON:  Chest radiograph 01/25/2017  FLUOROSCOPY TIME:  0 minutes 43 seconds  Dose: 20.85 mGy  FINDINGS: Images demonstrate placement of an intraspinal stimulator at the dorsal spinal canal in the lower cervical spine.  Limited landmarks makes it difficult to assign segment levels.  Severe osseous demineralization and scattered endplate spurring.  IMPRESSION: Intraspinal stimulator at inferior thoracic spine.  Electronically Signed   By: Lavonia Dana M.D.   On: 03/13/2018 11:19 DG Thoracic Spine 1 View CLINICAL DATA:  Thoracic spine intraspinal stimulator placement  EXAM: OPERATIVE THORACIC SPINE 7 VIEW(S)  COMPARISON:  Chest radiograph 01/25/2017  FLUOROSCOPY TIME:  0 minutes 43 seconds  Dose: 20.85 mGy  FINDINGS: Images demonstrate placement of an intraspinal stimulator at the dorsal spinal canal in the lower cervical spine.  Limited landmarks makes it difficult to assign segment levels.  Severe osseous demineralization and scattered endplate spurring.  IMPRESSION: Intraspinal stimulator at inferior thoracic spine.  Electronically Signed   By: Lavonia Dana M.D.   On: 03/13/2018 11:19 DG C-Arm 1-60 Min CLINICAL DATA:  Thoracic spine intraspinal stimulator placement  EXAM: OPERATIVE THORACIC SPINE 7 VIEW(S)  COMPARISON:  Chest radiograph 01/25/2017  FLUOROSCOPY TIME:  0 minutes 43 seconds  Dose: 20.85 mGy  FINDINGS: Images demonstrate placement of an intraspinal stimulator at the dorsal spinal canal in the lower cervical spine.  Limited landmarks makes it difficult to assign segment levels.  Severe osseous demineralization and scattered endplate spurring.  IMPRESSION: Intraspinal stimulator  at inferior thoracic spine.  Electronically Signed   By: Lavonia Dana M.D.   On: 03/13/2018 11:19   Admission on 03/13/2018, Discharged on 03/13/2018  Component Date Value Ref Range Status  . Glucose-Capillary 03/13/2018 121* 70 - 99 mg/dL Final  . Glucose-Capillary 03/13/2018 158* 70 - 99 mg/dL Final   Assessment  The primary encounter diagnosis was Lumbosacral radiculopathy at L5. Diagnoses of Chronic pain syndrome, DDD (degenerative disc disease), lumbar, and Lower limb pain, inferior, left (L5) (Foot Drop) were also pertinent to this visit.  Plan of Care  I am having Chase Mendez. Chase Mainland "Ronalee Belts" maintain his allopurinol, levothyroxine, atorvastatin, ondansetron, fluticasone, citalopram, linaclotide, vitamin E, losartan, mirabegron ER, diphenhydrAMINE, naloxone, gabapentin, pantoprazole, isosorbide mononitrate, vitamin B-12, Testosterone, acetaminophen, methocarbamol, aspirin, oxyCODONE, oxyCODONE, and oxyCODONE.  Pharmacotherapy (Medications Ordered): Meds ordered this encounter  Medications  . oxyCODONE (ROXICODONE) 15 MG immediate release tablet    Sig: Take 1 tablet (15 mg total) by mouth every 6 (six) hours as needed for up to 30 days for pain.    Dispense:  120 tablet    Refill:  0    Do not place this medication, or any other prescription from our practice, on "Automatic Refill". Patient may have prescription filled one day early if pharmacy is closed on scheduled refill date.    Order Specific Question:   Supervising Provider    Answer:   Milinda Pointer [phone]  . oxyCODONE (ROXICODONE) 15 MG immediate release tablet    Sig: Take 1 tablet (15 mg total) by mouth every 6 (six) hours as needed for up to 30 days for pain.    Dispense:  120 tablet    Refill:  0    Do not place this medication, or any other prescription from our practice, on "Automatic Refill". Patient may have prescription filled one day early if pharmacy is closed on scheduled refill date.    Order Specific  Question:   Supervising Provider    Answer:   Milinda Pointer [phone]  . oxyCODONE (ROXICODONE) 15 MG immediate release tablet    Sig: Take 1 tablet (15 mg total) by mouth every 6 (six) hours as needed for up to 30 days for pain.    Dispense:  120 tablet    Refill:  0    Do not place this medication, or any other prescription from our practice, on "Automatic Refill". Patient may have prescription filled one day early if pharmacy is closed on scheduled refill date.    Order Specific Question:   Supervising Provider    Answer:   Milinda Pointer [phone]   Orders:  No orders of the defined types were placed in this encounter.  Follow-up plan:   Return in about 3 months (around 09/18/2018) for MedMgmt.   I discussed the assessment and treatment plan with the patient. The patient was provided an opportunity to ask questions and all were answered. The patient agreed with the plan and demonstrated an understanding of the instructions.  Patient advised to call back or seek an in-person evaluation if the symptoms or condition worsens.  Total duration of non-face-to-face encounter: 13 minutes.  Note by: Dionisio David, NP Date: 06/18/2018; Time: 9:43 AM  Disclaimer:  * Given the special circumstances of the COVID-19 pandemic, the federal government has announced that the Office for Civil Rights (OCR) will exercise its enforcement discretion and will not impose penalties on physicians using telehealth in the event of noncompliance with regulatory requirements under the [place] and [place] (HIPAA) in connection with the good faith provision of telehealth during the RXYVO-59 national public health emergency. ([place])

## 2018-07-24 ENCOUNTER — Inpatient Hospital Stay: Payer: Medicare Other

## 2018-07-24 ENCOUNTER — Other Ambulatory Visit: Payer: Self-pay

## 2018-07-24 ENCOUNTER — Inpatient Hospital Stay: Payer: Medicare Other | Attending: Oncology | Admitting: Oncology

## 2018-07-24 ENCOUNTER — Encounter: Payer: Self-pay | Admitting: Oncology

## 2018-07-24 VITALS — BP 105/66 | HR 67 | Temp 97.9°F | Resp 18 | Ht 72.0 in | Wt 203.0 lb

## 2018-07-24 DIAGNOSIS — K219 Gastro-esophageal reflux disease without esophagitis: Secondary | ICD-10-CM

## 2018-07-24 DIAGNOSIS — R5383 Other fatigue: Secondary | ICD-10-CM | POA: Diagnosis not present

## 2018-07-24 DIAGNOSIS — F17210 Nicotine dependence, cigarettes, uncomplicated: Secondary | ICD-10-CM | POA: Insufficient documentation

## 2018-07-24 DIAGNOSIS — F329 Major depressive disorder, single episode, unspecified: Secondary | ICD-10-CM | POA: Diagnosis not present

## 2018-07-24 DIAGNOSIS — I252 Old myocardial infarction: Secondary | ICD-10-CM | POA: Diagnosis not present

## 2018-07-24 DIAGNOSIS — E039 Hypothyroidism, unspecified: Secondary | ICD-10-CM | POA: Insufficient documentation

## 2018-07-24 DIAGNOSIS — Z79899 Other long term (current) drug therapy: Secondary | ICD-10-CM | POA: Diagnosis not present

## 2018-07-24 DIAGNOSIS — D696 Thrombocytopenia, unspecified: Secondary | ICD-10-CM

## 2018-07-24 DIAGNOSIS — E119 Type 2 diabetes mellitus without complications: Secondary | ICD-10-CM | POA: Insufficient documentation

## 2018-07-24 DIAGNOSIS — I10 Essential (primary) hypertension: Secondary | ICD-10-CM | POA: Diagnosis not present

## 2018-07-24 DIAGNOSIS — G4700 Insomnia, unspecified: Secondary | ICD-10-CM | POA: Diagnosis not present

## 2018-07-24 DIAGNOSIS — M129 Arthropathy, unspecified: Secondary | ICD-10-CM | POA: Diagnosis not present

## 2018-07-24 DIAGNOSIS — Z7982 Long term (current) use of aspirin: Secondary | ICD-10-CM | POA: Insufficient documentation

## 2018-07-24 DIAGNOSIS — F419 Anxiety disorder, unspecified: Secondary | ICD-10-CM | POA: Insufficient documentation

## 2018-07-24 LAB — CBC WITH DIFFERENTIAL/PLATELET
Abs Immature Granulocytes: 0.03 10*3/uL (ref 0.00–0.07)
Basophils Absolute: 0 10*3/uL (ref 0.0–0.1)
Basophils Relative: 1 %
Eosinophils Absolute: 0.3 10*3/uL (ref 0.0–0.5)
Eosinophils Relative: 5 %
HCT: 43.1 % (ref 39.0–52.0)
Hemoglobin: 14.7 g/dL (ref 13.0–17.0)
Immature Granulocytes: 1 %
Lymphocytes Relative: 19 %
Lymphs Abs: 1.1 10*3/uL (ref 0.7–4.0)
MCH: 30.3 pg (ref 26.0–34.0)
MCHC: 34.1 g/dL (ref 30.0–36.0)
MCV: 88.9 fL (ref 80.0–100.0)
Monocytes Absolute: 0.6 10*3/uL (ref 0.1–1.0)
Monocytes Relative: 11 %
Neutro Abs: 3.8 10*3/uL (ref 1.7–7.7)
Neutrophils Relative %: 63 %
Platelets: 144 10*3/uL — ABNORMAL LOW (ref 150–400)
RBC: 4.85 MIL/uL (ref 4.22–5.81)
RDW: 13.8 % (ref 11.5–15.5)
WBC: 5.9 10*3/uL (ref 4.0–10.5)
nRBC: 0 % (ref 0.0–0.2)

## 2018-07-24 LAB — COMPREHENSIVE METABOLIC PANEL
ALT: 12 U/L (ref 0–44)
AST: 17 U/L (ref 15–41)
Albumin: 4.1 g/dL (ref 3.5–5.0)
Alkaline Phosphatase: 98 U/L (ref 38–126)
Anion gap: 8 (ref 5–15)
BUN: 17 mg/dL (ref 8–23)
CO2: 28 mmol/L (ref 22–32)
Calcium: 9 mg/dL (ref 8.9–10.3)
Chloride: 105 mmol/L (ref 98–111)
Creatinine, Ser: 1.19 mg/dL (ref 0.61–1.24)
GFR calc Af Amer: >60 mL/min (ref >60)
GFR calc non Af Amer: 57 mL/min — ABNORMAL LOW (ref >60)
Glucose, Bld: 103 mg/dL — ABNORMAL HIGH (ref 70–99)
Potassium: 4 mmol/L (ref 3.5–5.1)
Sodium: 141 mmol/L (ref 135–145)
Total Bilirubin: 0.7 mg/dL (ref 0.3–1.2)
Total Protein: 7.6 g/dL (ref 6.5–8.1)

## 2018-07-24 LAB — FOLATE: Folate: 22 ng/mL (ref >5.9)

## 2018-07-24 LAB — TECHNOLOGIST SMEAR REVIEW: Plt Morphology: NORMAL

## 2018-07-24 LAB — VITAMIN B12: Vitamin B-12: 981 pg/mL — ABNORMAL HIGH (ref 180–914)

## 2018-07-24 NOTE — Progress Notes (Signed)
Hematology/Oncology Consult note [facility] Telephone:([phone] [phone]   Patient Care Team: Jodi Marble, MD as PCP - General (Internal Medicine)  REFERRING PROVIDER: Jodi Marble, MD  CHIEF COMPLAINTS/REASON FOR VISIT:  Evaluation of thrombocytopenia  HISTORY OF PRESENTING ILLNESS:  Chase Mendez is a  84 y.o.  male male who was seen in consultation at the request of Dr.Tejan-Sie for evaluation  for evaluation of thrombocytopenia  Reviewed patient's labs which were obtained by PCP.  03/01/2018 cbc showed mildly decreased platelet counts at 147,000, wbc 6.9, , hemoglobin 14.7  Patient keeps a record of his platelet counts for the past few years.  Ranges from 100s to 140s. Fluctuating. 12/09/2014, CBC showed 1 14,000.  Associated symptoms or signs:  Denies weight loss, fever, chills, fatigue, night sweats.  Denies hematochezia, hematuria, hematemesis, epistaxis, black tarry stool.  Denies easy bruising.   Context:  History of hepatitis or HIV infection, denies History of gastric bypass: Denies Chronic fatigue at baseline.  Review of Systems  Constitutional: Positive for fatigue. Negative for appetite change, chills, fever and unexpected weight change.  HENT:   Negative for hearing loss and voice change.   Eyes: Negative for eye problems and icterus.  Respiratory: Negative for chest tightness, cough and shortness of breath.   Cardiovascular: Negative for chest pain and leg swelling.  Gastrointestinal: Negative for abdominal distention and abdominal pain.  Endocrine: Negative for hot flashes.  Genitourinary: Negative for difficulty urinating, dysuria and frequency.   Musculoskeletal: Negative for arthralgias.  Skin: Negative for itching and rash.  Neurological: Negative for light-headedness and numbness.  Hematological: Negative for adenopathy. Does not bruise/bleed easily.  Psychiatric/Behavioral: Negative for confusion.    MEDICAL HISTORY:  Past Medical History:  Diagnosis Date  . Acute encephalopathy 12/08/2014  . Anxiety   . ARF (acute renal failure) ([place]) 12/08/2014  . Back pain   . Benign neoplasm of large bowel   . Capsulitis    fractured displaced metatarsal with capsulitis  . Chronic back pain   . Depression   . Diabetes mellitus without complication (HCC)    no medications currently  . Dysphagia   . Exostosis    painful, right hallux  . Foot drop, left    wears a brace  . Frequent falls 02/2018   poor balance, foot drop  . GERD (gastroesophageal reflux disease)   . Gout   . Hypertension   . Hypothyroidism   . Insomnia   . Low testosterone   . Microscopic hematuria 2016  . Myocardial infarction [facility])    patient unaware when it happened years ago.    . Pneumonia 12/07/2014  . Pressure ulcer 12/09/2014  . Sepsis ([place]) 12/08/2014  . Thyroid disease     SURGICAL HISTORY: Past Surgical History:  Procedure Laterality Date  . APPENDECTOMY    . [place]   x 2  . CHOLECYSTECTOMY    . ESOPHAGOGASTRODUODENOSCOPY (EGD) WITH PROPOFOL N/A 04/24/2017   Procedure: ESOPHAGOGASTRODUODENOSCOPY (EGD) WITH PROPOFOL;  Surgeon: Manya Silvas, MD;  Location: [facility] ENDOSCOPY;  Service: Endoscopy;  Laterality: N/A;  . EYE SURGERY Bilateral 1983, 1985   cataract extractions  . SPINAL CORD STIMULATOR INSERTION N/A 03/13/2018   Procedure: SPINAL CORD STIMULATOR INSERTION;  Surgeon: Meade Maw, MD;  Location: [facility] ORS;  Service: Neurosurgery;  Laterality: N/A;    SOCIAL HISTORY: Social History   Socioeconomic History  . Marital status: Married    Spouse name: dorothy  .  Number of children: Not on file  . Years of education: Not on file  . Highest education level: Not on file  Occupational History  . Occupation: maintenance / repair    Comment: disabled  Social Needs  . Financial resource strain: Not on file  . Food insecurity    Worry: Not on file    Inability: Not on  file  . Transportation needs    Medical: Not on file    Non-medical: Not on file  Tobacco Use  . Smoking status: Current Every Day Smoker    Packs/day: 0.50    Years: 70.00    Pack years: 35.00    Types: Cigars  . Smokeless tobacco: Never Used  . Tobacco comment: unable to give cessation materials due to webex visit   Substance and Sexual Activity  . Alcohol use: No  . Drug use: Yes    Types: Oxycodone  . Sexual activity: Not Currently  Lifestyle  . Physical activity    Days per week: Not on file    Minutes per session: Not on file  . Stress: Not on file  Relationships  . Social Herbalist on phone: Not on file    Gets together: Not on file    Attends religious service: Not on file    Active member of club or organization: Not on file    Attends meetings of clubs or organizations: Not on file    Relationship status: Not on file  . Intimate partner violence    Fear of current or ex partner: Not on file    Emotionally abused: Not on file    Physically abused: Not on file    Forced sexual activity: Not on file  Other Topics Concern  . Not on file  Social History Narrative  . Not on file    FAMILY HISTORY: Family History  Problem Relation Age of Onset  . Heart disease Mother   . Diabetes Father   . Kidney cancer Brother     ALLERGIES:  is allergic to ibuprofen; sulfa antibiotics; and sulfasalazine.  MEDICATIONS:  Current Outpatient Medications  Medication Sig Dispense Refill  . acetaminophen (TYLENOL) 500 MG tablet Take 500 mg by mouth every 6 (six) hours as needed.    [name] allopurinol (ZYLOPRIM) 100 MG tablet Take 100 mg by mouth daily.  0  . aspirin 81 MG tablet Take 81 mg by mouth daily.    [name] atorvastatin (LIPITOR) 20 MG tablet Take 20 mg by mouth daily.   0  . citalopram (CELEXA) 40 MG tablet Take 40 mg by mouth daily.     . diphenhydrAMINE (BENADRYL) 25 MG tablet Take 25 mg by mouth 2 (two) times daily.     . fluticasone (FLONASE) 50 MCG/ACT nasal  spray Place 1 spray into both nostrils daily as needed for rhinitis.     [name] gabapentin (NEURONTIN) 400 MG capsule Take 400 mg by mouth 3 (three) times daily.    . isosorbide mononitrate (IMDUR) 30 MG 24 hr tablet Take 30 mg by mouth daily.    [name] levothyroxine (SYNTHROID, LEVOTHROID) 125 MCG tablet Take 125 mcg by mouth daily before breakfast.   0  . linaclotide (LINZESS) 290 MCG CAPS capsule Take 290 mcg by mouth daily as needed (for constipation).     [name] losartan (COZAAR) 50 MG tablet Take 50 mg by mouth daily.    . methocarbamol (ROBAXIN) 500 MG tablet Take 1 tablet (500 mg total) by mouth every  6 (six) hours as needed for muscle spasms. (Patient taking differently: Take 500 mg by mouth at bedtime. ) 60 tablet 0  . mirabegron ER (MYRBETRIQ) 25 MG TB24 tablet Take 25 mg by mouth daily.    . naloxone (NARCAN) nasal spray 4 mg/0.1 mL Spray into one nostril. Repeat with second device into other nostril after 2-3 minutes if no or minimal response. Use in case of opioid overdose. (Patient taking differently: Place 1 spray into the nose once. Spray into one nostril. Repeat with second device into other nostril after 2-3 minutes if no or minimal response. Use in case of opioid overdose.) 1 kit 0  . ondansetron (ZOFRAN ODT) 4 MG disintegrating tablet Take 1 tablet (4 mg total) by mouth every 8 (eight) hours as needed for nausea or vomiting. (Patient taking differently: Take 4 mg by mouth every 12 (twelve) hours as needed for nausea or vomiting. ) 20 tablet 0  . oxyCODONE (ROXICODONE) 15 MG immediate release tablet Take 1 tablet (15 mg total) by mouth every 6 (six) hours as needed for up to 30 days for pain. 120 tablet 0  . oxyCODONE (ROXICODONE) 15 MG immediate release tablet Take 1 tablet (15 mg total) by mouth every 6 (six) hours as needed for up to 30 days for pain. 120 tablet 0  . pantoprazole (PROTONIX) 40 MG tablet Take 40 mg by mouth 2 (two) times daily.    . Testosterone (ANDROGEL) 40.5 MG/2.5GM (1.62%)  GEL Place 2 Pump onto the skin 3 (three) times a week.    . vitamin B-12 (CYANOCOBALAMIN) 1000 MCG tablet Take 1,000 mcg by mouth daily.    . vitamin E 400 UNIT capsule Take 400 Units by mouth daily.    Derrill Memo ON 08/20/2018] oxyCODONE (ROXICODONE) 15 MG immediate release tablet Take 1 tablet (15 mg total) by mouth every 6 (six) hours as needed for up to 30 days for pain. 120 tablet 0   No current facility-administered medications for this visit.      PHYSICAL EXAMINATION: ECOG PERFORMANCE STATUS: 1 - Symptomatic but completely ambulatory Vitals:   07/24/18 1000  BP: 105/66  Pulse: 67  Resp: 18  Temp: 97.9 F (36.6 C)   Filed Weights   07/24/18 1000  Weight: 203 lb (92.1 kg)    Physical Exam Constitutional:      General: He is not in acute distress.    Comments: Walk in with walker.  HENT:     Head: Normocephalic and atraumatic.  Eyes:     General: No scleral icterus.    Pupils: Pupils are equal, round, and reactive to light.  Neck:     Musculoskeletal: Normal range of motion and neck supple.  Cardiovascular:     Rate and Rhythm: Normal rate and regular rhythm.     Heart sounds: Normal heart sounds.  Pulmonary:     Effort: Pulmonary effort is normal. No respiratory distress.     Breath sounds: No wheezing.  Abdominal:     General: Bowel sounds are normal. There is no distension.     Palpations: Abdomen is soft. There is no mass.     Tenderness: There is no abdominal tenderness.  Musculoskeletal: Normal range of motion.        General: No deformity.  Skin:    General: Skin is warm and dry.     Findings: No erythema or rash.  Neurological:     Mental Status: He is alert and oriented to person, place, and time.  Cranial Nerves: No cranial nerve deficit.     Coordination: Coordination normal.  Psychiatric:        Behavior: Behavior normal.        Thought Content: Thought content normal.      LABORATORY DATA:  I have reviewed the data as listed Lab Results   Component Value Date   WBC 5.9 07/24/2018   HGB 14.7 07/24/2018   HCT 43.1 07/24/2018   MCV 88.9 07/24/2018   PLT 144 (L) 07/24/2018   Recent Labs    03/01/18 1204 07/24/18 1058  NA 140 141  K 4.1 4.0  CL 104 105  CO2 29 28  GLUCOSE 89 103*  BUN 17 17  CREATININE 1.21 1.19  CALCIUM 9.0 9.0  GFRNONAA 56* 57*  GFRAA >60 >60  PROT  --  7.6  ALBUMIN  --  4.1  AST  --  17  ALT  --  12  ALKPHOS  --  98  BILITOT  --  0.7   Iron/TIBC/Ferritin/ %Sat No results found for: IRON, TIBC, FERRITIN, IRONPCTSAT      ASSESSMENT & PLAN:  1. Thrombocytopenia ([place])    Previous labs were reviewed and discussed with patient.  His platelet count fluctuates over the past couple years. Nadir at low 100,000s.  For the work up of patient's thrombocytopenia, I recommend checking CBC;CMP,smear review, folate, Vitamin B12, hepatitis, HIV, A Will also check ultrasound of the abdomen.  # Patient follow-up with me in approximately 4 weeks to review the above results.  Per patient's request, I called patient's wife and update her about the plan.  Orders Placed This Encounter  Procedures  . US Abdomen Complete    Standing Status:   Future    Standing Expiration Date:   07/24/2019    Order Specific Question:   Reason for Exam (SYMPTOM  OR DIAGNOSIS REQUIRED)    Answer:   thrombocytopenia    Order Specific Question:   Preferred imaging location?    Answer:   [facility]  . CBC with Differential/Platelet    Standing Status:   Future    Number of Occurrences:   1    Standing Expiration Date:   07/24/2019  . Comprehensive metabolic panel    Standing Status:   Future    Number of Occurrences:   1    Standing Expiration Date:   07/24/2019  . Technologist smear review    Standing Status:   Future    Number of Occurrences:   1    Standing Expiration Date:   07/24/2019  . HIV Antibody (routine testing w rflx)    Standing Status:   Future    Number of Occurrences:   1    Standing Expiration  Date:   07/24/2019  . Hepatitis C antibody    Standing Status:   Future    Number of Occurrences:   1    Standing Expiration Date:   07/24/2019  . Hepatitis B surface antigen    Standing Status:   Future    Number of Occurrences:   1    Standing Expiration Date:   07/24/2019  . Hepatitis B surface antibody    Standing Status:   Future    Number of Occurrences:   1    Standing Expiration Date:   07/24/2019  . Vitamin B12    Standing Status:   Future    Number of Occurrences:   1    Standing Expiration Date:   07/24/2019  .  Folate    Standing Status:   Future    Number of Occurrences:   1    Standing Expiration Date:   07/24/2019    All questions were answered. The patient knows to call the clinic with any problems questions or concerns.  Return of visit:4 weeks.  We spent sufficient time to discuss many aspect of care, questions were answered to patient's satisfaction. Total face to face encounter time for this patient visit was 41mn. >50% of the time was  spent in counseling and coordination of care.   ZEarlie Server MD, PhD Hematology Oncology [facility]at ADay[facility] General HospitalPager- [phone]/17/2020

## 2018-07-24 NOTE — Progress Notes (Signed)
Pt does not have any side effects of plt count. Sometimes it is normal value. He does take baby asa and has some places on his arm where he bumped into things .

## 2018-07-25 LAB — HEPATITIS B SURFACE ANTIBODY, QUANTITATIVE: Hep B S AB Quant (Post): 3.6 m[IU]/mL — ABNORMAL LOW (ref >9.9)

## 2018-07-25 LAB — HIV ANTIBODY (ROUTINE TESTING W REFLEX): HIV Screen 4th Generation wRfx: NONREACTIVE

## 2018-07-25 LAB — HEPATITIS C ANTIBODY: HCV Ab: <0.1 s/co ratio (ref 0.0–0.9)

## 2018-07-25 LAB — HEPATITIS B SURFACE ANTIGEN: Hepatitis B Surface Ag: NEGATIVE

## 2018-07-29 ENCOUNTER — Other Ambulatory Visit: Payer: Self-pay

## 2018-07-29 ENCOUNTER — Ambulatory Visit
Admission: RE | Admit: 2018-07-29 | Discharge: 2018-07-29 | Disposition: A | Discharge status: Home / Self Care | Payer: Medicare Other | Source: Ambulatory Visit | Attending: Oncology | Admitting: Oncology

## 2018-07-29 DIAGNOSIS — D696 Thrombocytopenia, unspecified: Secondary | ICD-10-CM | POA: Diagnosis present

## 2018-08-23 ENCOUNTER — Inpatient Hospital Stay: Payer: Medicare Other | Attending: Oncology | Admitting: Oncology

## 2018-08-23 ENCOUNTER — Encounter: Payer: Self-pay | Admitting: Oncology

## 2018-08-23 ENCOUNTER — Other Ambulatory Visit: Payer: Self-pay

## 2018-08-23 VITALS — BP 137/79 | HR 66 | Temp 98.3°F | Resp 18 | Wt 203.5 lb

## 2018-08-23 DIAGNOSIS — D696 Thrombocytopenia, unspecified: Secondary | ICD-10-CM | POA: Insufficient documentation

## 2018-08-23 DIAGNOSIS — G4700 Insomnia, unspecified: Secondary | ICD-10-CM | POA: Insufficient documentation

## 2018-08-23 DIAGNOSIS — K760 Fatty (change of) liver, not elsewhere classified: Secondary | ICD-10-CM | POA: Insufficient documentation

## 2018-08-23 DIAGNOSIS — Z9884 Bariatric surgery status: Secondary | ICD-10-CM | POA: Insufficient documentation

## 2018-08-23 DIAGNOSIS — K219 Gastro-esophageal reflux disease without esophagitis: Secondary | ICD-10-CM | POA: Diagnosis not present

## 2018-08-23 DIAGNOSIS — Z7982 Long term (current) use of aspirin: Secondary | ICD-10-CM | POA: Diagnosis not present

## 2018-08-23 DIAGNOSIS — E039 Hypothyroidism, unspecified: Secondary | ICD-10-CM | POA: Insufficient documentation

## 2018-08-23 DIAGNOSIS — Z79899 Other long term (current) drug therapy: Secondary | ICD-10-CM | POA: Insufficient documentation

## 2018-08-23 DIAGNOSIS — I252 Old myocardial infarction: Secondary | ICD-10-CM | POA: Diagnosis not present

## 2018-08-23 DIAGNOSIS — I10 Essential (primary) hypertension: Secondary | ICD-10-CM | POA: Diagnosis not present

## 2018-08-23 DIAGNOSIS — N200 Calculus of kidney: Secondary | ICD-10-CM | POA: Insufficient documentation

## 2018-08-23 DIAGNOSIS — E119 Type 2 diabetes mellitus without complications: Secondary | ICD-10-CM | POA: Diagnosis not present

## 2018-08-23 DIAGNOSIS — F418 Other specified anxiety disorders: Secondary | ICD-10-CM | POA: Insufficient documentation

## 2018-08-23 DIAGNOSIS — R5383 Other fatigue: Secondary | ICD-10-CM | POA: Diagnosis not present

## 2018-08-23 DIAGNOSIS — F17210 Nicotine dependence, cigarettes, uncomplicated: Secondary | ICD-10-CM | POA: Insufficient documentation

## 2018-08-23 NOTE — Progress Notes (Signed)
Pt in for follow up, denies any concerns today. 

## 2018-08-23 NOTE — Progress Notes (Signed)
Hematology/Oncology follow up  note [facility] [phone] [phone]   Patient Care Team: Jodi Marble, MD as PCP - General (Internal Medicine)  REFERRING PROVIDER: Jodi Marble, MD  CHIEF COMPLAINTS/REASON FOR VISIT:  Evaluation of thrombocytopenia  HISTORY OF PRESENTING ILLNESS:  Chase Mendez is a  84 y.o.  male male who was seen in consultation at the request of Dr.Tejan-Sie for evaluation  for evaluation of thrombocytopenia  Reviewed patient's labs which were obtained by PCP.  03/01/2018 cbc showed mildly decreased platelet counts at 147,000, wbc 6.9, , hemoglobin 14.7  Patient keeps a record of his platelet counts for the past few years.  Ranges from 100s to 140s. Fluctuating. 12/09/2014, CBC showed 1 14,000.  Associated symptoms or signs:  Denies weight loss, fever, chills, fatigue, night sweats.  Denies hematochezia, hematuria, hematemesis, epistaxis, black tarry stool.  Denies easy bruising.   Context:  History of hepatitis or HIV infection, denies History of gastric bypass: Denies Chronic fatigue at baseline.  INTERVAL HISTORY Chase Mendez is a 84 y.o. male who has above history reviewed by me today presents for follow up visit for management of thrombocytopenia.  Problems and complaints are listed below: Patient reports feeling well at baseline today.  Present to discuss lab results. No new complaints.  Review of Systems  Constitutional: Positive for fatigue. Negative for appetite change, chills, fever and unexpected weight change.  HENT:   Negative for hearing loss and voice change.   Eyes: Negative for eye problems and icterus.  Respiratory: Negative for chest tightness, cough and shortness of breath.   Cardiovascular: Negative for chest pain and leg swelling.  Gastrointestinal: Negative for abdominal distention and abdominal pain.  Endocrine: Negative for hot flashes.  Genitourinary: Negative for  difficulty urinating, dysuria and frequency.   Musculoskeletal: Negative for arthralgias.  Skin: Negative for itching and rash.  Neurological: Negative for light-headedness and numbness.  Hematological: Negative for adenopathy. Does not bruise/bleed easily.  Psychiatric/Behavioral: Negative for confusion.    MEDICAL HISTORY:  Past Medical History:  Diagnosis Date   Acute encephalopathy 12/08/2014   Anxiety    ARF (acute renal failure) ([place]) 12/08/2014   Back pain    Benign neoplasm of large bowel    Capsulitis    fractured displaced metatarsal with capsulitis   Chronic back pain    Depression    Diabetes mellitus without complication (HCC)    no medications currently   Dysphagia    Exostosis    painful, right hallux   Foot drop, left    wears a brace   Frequent falls 02/2018   poor balance, foot drop   GERD (gastroesophageal reflux disease)    Gout    Hypertension    Hypothyroidism    Insomnia    Low testosterone    Microscopic hematuria 2016   Myocardial infarction [facility])    patient unaware when it happened years ago.     Pneumonia 12/07/2014   Pressure ulcer 12/09/2014   Sepsis ([place]) 12/08/2014   Thyroid disease     SURGICAL HISTORY: Past Surgical History:  Procedure Laterality Date   [place], 1996   x 2   CHOLECYSTECTOMY     ESOPHAGOGASTRODUODENOSCOPY (EGD) WITH PROPOFOL N/A 04/24/2017   Procedure: ESOPHAGOGASTRODUODENOSCOPY (EGD) WITH PROPOFOL;  Surgeon: Manya Silvas, MD;  Location: [facility] ENDOSCOPY;  Service: Endoscopy;  Laterality: N/A;   EYE SURGERY Bilateral 1983, 1985   cataract extractions  SPINAL CORD STIMULATOR INSERTION N/A 03/13/2018   Procedure: SPINAL CORD STIMULATOR INSERTION;  Surgeon: Meade Maw, MD;  Location: [facility] ORS;  Service: Neurosurgery;  Laterality: N/A;    SOCIAL HISTORY: Social History   Socioeconomic History   Marital status: Married    Spouse name: dorothy     Number of children: Not on file   Years of education: Not on file   Highest education level: Not on file  Occupational History   Occupation: maintenance / Office manager    Comment: disabled  Scientist, product/process development strain: Not on file   Food insecurity    Worry: Not on file    Inability: Not on file   Transportation needs    Medical: Not on file    Non-medical: Not on file  Tobacco Use   Smoking status: Current Every Day Smoker    Packs/day: 0.50    Years: 70.00    Pack years: 35.00    Types: Cigars   Smokeless tobacco: Never Used   Tobacco comment: unable to give cessation materials due to webex visit   Substance and Sexual Activity   Alcohol use: No   Drug use: Yes    Types: Oxycodone   Sexual activity: Not Currently  Lifestyle   Physical activity    Days per week: Not on file    Minutes per session: Not on file   Stress: Not on file  Relationships   Social connections    Talks on phone: Not on file    Gets together: Not on file    Attends religious service: Not on file    Active member of club or organization: Not on file    Attends meetings of clubs or organizations: Not on file    Relationship status: Not on file   Intimate partner violence    Fear of current or ex partner: Not on file    Emotionally abused: Not on file    Physically abused: Not on file    Forced sexual activity: Not on file  Other Topics Concern   Not on file  Social History Narrative   Not on file    FAMILY HISTORY: Family History  Problem Relation Age of Onset   Heart disease Mother    Diabetes Father    Kidney cancer Brother     ALLERGIES:  is allergic to ibuprofen; sulfa antibiotics; and sulfasalazine.  MEDICATIONS:  Current Outpatient Medications  Medication Sig Dispense Refill   acetaminophen (TYLENOL) 500 MG tablet Take 500 mg by mouth every 6 (six) hours as needed.     allopurinol (ZYLOPRIM) 100 MG tablet Take 100 mg by mouth daily.  0    aspirin 81 MG tablet Take 81 mg by mouth daily.     atorvastatin (LIPITOR) 20 MG tablet Take 20 mg by mouth daily.   0   citalopram (CELEXA) 40 MG tablet Take 40 mg by mouth daily.      diphenhydrAMINE (BENADRYL) 25 MG tablet Take 25 mg by mouth 2 (two) times daily.      fluticasone (FLONASE) 50 MCG/ACT nasal spray Place 1 spray into both nostrils daily as needed for rhinitis.      gabapentin (NEURONTIN) 400 MG capsule Take 400 mg by mouth 3 (three) times daily.     isosorbide mononitrate (IMDUR) 30 MG 24 hr tablet Take 30 mg by mouth daily.     levothyroxine (SYNTHROID, LEVOTHROID) 125 MCG tablet Take 125 mcg by mouth daily before breakfast.  0   linaclotide (LINZESS) 290 MCG CAPS capsule Take 290 mcg by mouth daily as needed (for constipation).      losartan (COZAAR) 50 MG tablet Take 50 mg by mouth daily.     methocarbamol (ROBAXIN) 500 MG tablet Take 1 tablet (500 mg total) by mouth every 6 (six) hours as needed for muscle spasms. (Patient taking differently: Take 500 mg by mouth at bedtime. ) 60 tablet 0   mirabegron ER (MYRBETRIQ) 25 MG TB24 tablet Take 25 mg by mouth daily.     naloxone (NARCAN) nasal spray 4 mg/0.1 mL Spray into one nostril. Repeat with second device into other nostril after 2-3 minutes if no or minimal response. Use in case of opioid overdose. (Patient taking differently: Place 1 spray into the nose once. Spray into one nostril. Repeat with second device into other nostril after 2-3 minutes if no or minimal response. Use in case of opioid overdose.) 1 kit 0   oxyCODONE (ROXICODONE) 15 MG immediate release tablet Take 1 tablet (15 mg total) by mouth every 6 (six) hours as needed for up to 30 days for pain. 120 tablet 0   Testosterone (ANDROGEL) 40.5 MG/2.5GM (1.62%) GEL Place 2 Pump onto the skin 3 (three) times a week.     vitamin B-12 (CYANOCOBALAMIN) 1000 MCG tablet Take 1,000 mcg by mouth daily.     vitamin E 400 UNIT capsule Take 400 Units by mouth  daily.     ondansetron (ZOFRAN ODT) 4 MG disintegrating tablet Take 1 tablet (4 mg total) by mouth every 8 (eight) hours as needed for nausea or vomiting. (Patient not taking: Reported on 08/23/2018) 20 tablet 0   oxyCODONE (ROXICODONE) 15 MG immediate release tablet Take 1 tablet (15 mg total) by mouth every 6 (six) hours as needed for up to 30 days for pain. 120 tablet 0   oxyCODONE (ROXICODONE) 15 MG immediate release tablet Take 1 tablet (15 mg total) by mouth every 6 (six) hours as needed for up to 30 days for pain. 120 tablet 0   pantoprazole (PROTONIX) 40 MG tablet Take 40 mg by mouth 2 (two) times daily.     No current facility-administered medications for this visit.      PHYSICAL EXAMINATION: ECOG PERFORMANCE STATUS: 1 - Symptomatic but completely ambulatory Vitals:   08/23/18 1001  BP: 137/79  Pulse: 66  Resp: 18  Temp: 98.3 F (36.8 C)  SpO2: 96%   Filed Weights   08/23/18 1001  Weight: 203 lb 8 oz (92.3 kg)    Physical Exam Constitutional:      General: He is not in acute distress.    Comments: Walk in with walker.  HENT:     Head: Normocephalic and atraumatic.  Eyes:     General: No scleral icterus.    Pupils: Pupils are equal, round, and reactive to light.  Neck:     Musculoskeletal: Normal range of motion and neck supple.  Cardiovascular:     Rate and Rhythm: Normal rate and regular rhythm.     Heart sounds: Normal heart sounds.  Pulmonary:     Effort: Pulmonary effort is normal. No respiratory distress.     Breath sounds: No wheezing.  Abdominal:     General: Bowel sounds are normal. There is no distension.     Palpations: Abdomen is soft. There is no mass.     Tenderness: There is no abdominal tenderness.  Musculoskeletal: Normal range of motion.  General: No deformity.  Skin:    General: Skin is warm and dry.     Findings: No erythema or rash.  Neurological:     Mental Status: He is alert and oriented to person, place, and time.      Cranial Nerves: No cranial nerve deficit.     Coordination: Coordination normal.  Psychiatric:        Behavior: Behavior normal.        Thought Content: Thought content normal.      LABORATORY DATA:  I have reviewed the data as listed Lab Results  Component Value Date   WBC 5.9 07/24/2018   HGB 14.7 07/24/2018   HCT 43.1 07/24/2018   MCV 88.9 07/24/2018   PLT 144 (L) 07/24/2018   Recent Labs    03/01/18 1204 07/24/18 1058  NA 140 141  K 4.1 4.0  CL 104 105  CO2 29 28  GLUCOSE 89 103*  BUN 17 17  CREATININE 1.21 1.19  CALCIUM 9.0 9.0  GFRNONAA 56* 57*  GFRAA >60 >60  PROT  --  7.6  ALBUMIN  --  4.1  AST  --  17  ALT  --  12  ALKPHOS  --  98  BILITOT  --  0.7   Iron/TIBC/Ferritin/ %Sat No results found for: IRON, TIBC, FERRITIN, IRONPCTSAT   RADIOGRAPHIC STUDIES: I have personally reviewed the radiological images as listed and agreed with the findings in the report. US Abdomen Complete  Result Date: 07/29/2018 CLINICAL DATA:  Thrombocytopenia; history of renal failure, diabetes mellitus, GERD, hypertension, MI EXAM: ABDOMEN ULTRASOUND COMPLETE COMPARISON:  CT abdomen and pelvis 04/12/2009 FINDINGS: Gallbladder: Surgically absent Common bile duct: Diameter: Questionably visualized 3 mm diameter Liver: Slightly increased parenchymal echogenicity question fatty infiltration though this can be seen with cirrhosis and certain infiltrative disorders. No focal hepatic mass or nodularity identified. No intrahepatic biliary dilatation. Portal vein is patent on color Doppler imaging with normal direction of blood flow towards the liver. IVC: Normal appearance Pancreas: Portion of pancreatic body and proximal tail normal appearance, remainder obscured by bowel gas Spleen: Normal appearance, 10.6 cm length Right Kidney: Length: 11.2 cm. Cortical thinning. Normal cortical echogenicity. Shadowing echogenic focus mid kidney question nonobstructing calculus 7 mm diameter. No mass or  hydronephrosis. Left Kidney: Length: 11.7 cm. Cortical thinning. Normal cortical echogenicity. No mass or hydronephrosis Abdominal aorta: Visualized portion normal caliber, portions obscured by bowel gas. Other findings: No free fluid IMPRESSION: Post cholecystectomy. Question fatty infiltration of liver as above. Incomplete pancreatic and aortic visualization. Suspect 7 mm nonobstructing mid RIGHT renal calculus. Electronically Signed   By: Lavonia Dana M.D.   On: 07/29/2018 10:28      ASSESSMENT & PLAN:  1. Thrombocytopenia ([place])   Labs are reviewed and discussed with patient. Platelet count is mildly low at 14.7. Negative hepatitis B and C, no vitamin B12 or folate deficiency. Abdominal ultrasound was intubated reviewed by me and discussed with patient. Fatty liver disease, no splenomegaly.  Nonobstructive right kidney stone.  Findings were discussed with patient. Given the mild degree of thrombocytopenia, patient's advanced age, I recommend holding additional work-up for now. Continue to trend CBC every 6 months. Also called patient's wife Earlie Server and updated her above. No orders of the defined types were placed in this encounter.   All questions were answered. The patient knows to call the clinic with any problems questions or concerns.  Return of visit:4 weeks.  We spent sufficient time to discuss many aspect  of care, questions were answered to patient's satisfaction. Total face to face encounter time for this patient visit was 8mn. >50% of the time was  spent in counseling and coordination of care.   ZEarlie Server MD, PhD Hematology Oncology CJane Todd Crawford Memorial Hospitalat AMetrowest Medical Center - Framingham CampusPager- [phone]/17/2020

## 2018-09-09 ENCOUNTER — Telehealth: Payer: Self-pay | Admitting: *Deleted

## 2018-09-09 NOTE — Telephone Encounter (Signed)
Attempted to call patient to gather information to give to [name] regarding location of pain.  There was no answer at the home phone and the mobile number is not in his name.

## 2018-09-09 NOTE — Telephone Encounter (Signed)
[employer] Medicare doesn't require prior auth

## 2018-09-10 ENCOUNTER — Telehealth: Payer: Self-pay | Admitting: *Deleted

## 2018-09-10 ENCOUNTER — Encounter: Payer: Self-pay | Admitting: Pain Medicine

## 2018-09-10 NOTE — Telephone Encounter (Signed)
Spoke with patient and he needs to change insurance information.  I told him I was unable to do this but I would get him to the front end staff who could assist.

## 2018-09-10 NOTE — Progress Notes (Signed)
Pain Management Virtual Encounter Note - Virtual Visit via Telephone Telehealth (real-time audio visits between healthcare provider and patient).   Patient's Phone No. & Preferred Pharmacy:  [phone] (home); [phone] (mobile); (Preferred) [phone] [email or web address]  [facility] - [place], [place] [address] Phone: [phone] Fax: [phone]    Pre-screening note:  Our staff contacted Chase Mendez and offered him an "in person", "face-to-face" appointment versus a telephone encounter. He indicated preferring the telephone encounter, at this time.   Mendez for Virtual Visit: COVID-19*  Social distancing based on CDC and AMA recommendations.   I contacted Chase Mendez on 09/11/2018 via telephone.      I clearly identified myself as Gaspar Cola, MD. I verified that I was speaking with the correct person using two identifiers (Name: Chase Mendez, and date of birth: 14-Oct-1934).  Advanced Informed Consent I sought verbal advanced consent from Chase Mendez for virtual visit interactions. I informed Chase Mendez of possible security and privacy concerns, risks, and limitations associated with providing "not-in-person" medical evaluation and management services. I also informed Chase Mendez of the availability of "in-person" appointments. Finally, I informed him that there would be a charge for the virtual visit and that he could be  personally, fully or partially, financially responsible for it. Chase Mendez expressed understanding and agreed to proceed.   Historic Elements   Chase Mendez is a 84 y.o. year old, male patient evaluated today after his last encounter by our practice on 09/10/2018. Chase Mendez  has a past medical history of Acute encephalopathy (12/08/2014), Anxiety, ARF (acute renal failure) ([place]) (12/08/2014), Back pain, Benign neoplasm of large bowel, Capsulitis, Chronic back pain, Depression, Diabetes  mellitus without complication ([place]), Dysphagia, Exostosis, Foot drop, left, Frequent falls (02/2018), GERD (gastroesophageal reflux disease), Gout, Hypertension, Hypothyroidism, Insomnia, Low testosterone, Microscopic hematuria (2016), Myocardial infarction ([place]), Pneumonia (12/07/2014), Pressure ulcer (12/09/2014), Sepsis ([place]) (12/08/2014), and Thyroid disease. He also  has a past surgical history that includes Cholecystectomy; Esophagogastroduodenoscopy (egd) with propofol (N/A, 04/24/2017); Back surgery (1995, 1996); Eye surgery (Bilateral, 1983, 1985); Appendectomy; and Spinal cord stimulator insertion (N/A, 03/13/2018). Chase Mendez has a current medication list which includes the following prescription(s): allopurinol, aspirin, atorvastatin, citalopram, diphenhydramine, fluticasone, gabapentin, isosorbide mononitrate, levothyroxine, linaclotide, losartan, mirabegron er, naloxone, ondansetron, oxycodone, oxycodone, oxycodone, testosterone, vitamin b-12, vitamin e, gabapentin, pantoprazole, and tizanidine. He  reports that he has been smoking cigars. He has a 35.00 pack-year smoking history. He has never used smokeless tobacco. He reports current drug use. Drug: Oxycodone. He reports that he does not drink alcohol. Chase Mendez is allergic to ibuprofen; sulfa antibiotics; and sulfasalazine.   HPI  Today, he is being contacted for medication management.  Today's encounter is to update what has happened with the spinal cord stimulator implant and also to see if the patient would like for [place] to take over his membrane stabilizer (gabapentin) and his muscle relaxant (methocarbamol).  The patient indicated that in the past he used to be taking gabapentin 600 mg p.o. 3 times daily but he had an episode where he took some sleeping medicine during the day and ended up in the hospital.  When they saw him, they assumed that the gabapentin was contributing to that and they lowered his dose from 600 mg p.o. 3 times daily to 400  mg p.o. 3 times daily.  The patient and his wife are convinced that the gabapentin had nothing to do  with the episode that he experienced and they also noticed that he was at that time that things started going downhill.  Today we have decided to go ahead and increase the gabapentin again to 600 mg p.o. 3 times daily and see if he can tolerate that well.  In addition, after the surgery he had been given some Robaxin which did help with the muscle spasms and he reports that he normally takes 1 at bedtime but occasionally may take one 2-3 times a day.  Unfortunately, this is not covered by his insurance and the choice that they give him is Zanaflex.  Therefore, today we will go ahead and write him a prescription for tizanidine 4 mg to be taken 1 p.o. daily to 3 times daily.  In addition to this, the patient is complaining that the permanent spinal cord stimulator implant is not working as well as the trial.  He called the [employer] Manuela Schwartz) and she offered to see him to reprogram the device.  However, due to the COVID-19 pandemic, he does not want to go out, if he can avoid it.  Because of this, he is willing to stay as is until things improve at which time he has agreed that he will call us so that we can bring him in to analyze and perhaps reprogram the spinal cord stimulator.  Pharmacotherapy Assessment  Analgesic: Oxycodone IR 15 mg, 1 tab PO q 6 hrs. (60 mg/day of oxycodone) MME/day:90mg /day.   Monitoring: Pharmacotherapy: No side-effects or adverse reactions reported. [place] PMP: PDMP reviewed during this encounter.       Compliance: No problems identified. Effectiveness: Clinically acceptable. Plan: Refer to "POC".  UDS:  Summary  Date Value Ref Range Status  11/14/2017 FINAL  Final    Comment:    ==================================================================== TOXASSURE SELECT 13 (MW) ==================================================================== Test                              Result       Flag       Units Drug Present and Declared for Prescription Verification   Oxycodone                      4937         EXPECTED   ng/mg creat   Oxymorphone                    1650         EXPECTED   ng/mg creat   Noroxycodone                   6664         EXPECTED   ng/mg creat   Noroxymorphone                 520          EXPECTED   ng/mg creat    Sources of oxycodone are scheduled prescription medications.    Oxymorphone, noroxycodone, and noroxymorphone are expected    metabolites of oxycodone. Oxymorphone is also available as a    scheduled prescription medication. ==================================================================== Test                      Result    Flag   Units      Ref Range   Creatinine  139              mg/dL      >=20 ==================================================================== Declared Medications:  The flagging and interpretation on this report are based on the  following declared medications.  Unexpected results may arise from  inaccuracies in the declared medications.  **Note: The testing scope of this panel includes these medications:  Oxycodone  **Note: The testing scope of this panel does not include following  reported medications:  Allopurinol  Aspirin (Aspirin 81)  Atorvastatin  Citalopram  Cyanocobalamin  Diphenhydramine  Fluticasone  Gabapentin  Isosorbide  Levothyroxine  Linaclotide  Losartan (Losartan Potassium)  Mirabegron  Naloxone  Omeprazole  Ondansetron  Testosterone  Vitamin E ==================================================================== For clinical consultation, please call [phone]. ====================================================================    Laboratory Chemistry Profile (12 mo)  Renal: 07/24/2018: BUN 17; Creatinine, Ser 1.19; GFR calc Af Amer >60; GFR calc non Af Amer 57  Hepatic: 07/24/2018: Albumin 4.1; ALT 12; AST 17 Other: 07/24/2018: Vitamin  B-12 981 Note: Above Lab results reviewed.  Imaging  Last 90 days:  US Abdomen Complete  Result Date: 07/29/2018 CLINICAL DATA:  Thrombocytopenia; history of renal failure, diabetes mellitus, GERD, hypertension, MI EXAM: ABDOMEN ULTRASOUND COMPLETE COMPARISON:  CT abdomen and pelvis 04/12/2009 FINDINGS: Gallbladder: Surgically absent Common bile duct: Diameter: Questionably visualized 3 mm diameter Liver: Slightly increased parenchymal echogenicity question fatty infiltration though this can be seen with cirrhosis and certain infiltrative disorders. No focal hepatic mass or nodularity identified. No intrahepatic biliary dilatation. Portal vein is patent on color Doppler imaging with normal direction of blood flow towards the liver. IVC: Normal appearance Pancreas: Portion of pancreatic body and proximal tail normal appearance, remainder obscured by bowel gas Spleen: Normal appearance, 10.6 cm length Right Kidney: Length: 11.2 cm. Cortical thinning. Normal cortical echogenicity. Shadowing echogenic focus mid kidney question nonobstructing calculus 7 mm diameter. No mass or hydronephrosis. Left Kidney: Length: 11.7 cm. Cortical thinning. Normal cortical echogenicity. No mass or hydronephrosis Abdominal aorta: Visualized portion normal caliber, portions obscured by bowel gas. Other findings: No free fluid IMPRESSION: Post cholecystectomy. Question fatty infiltration of liver as above. Incomplete pancreatic and aortic visualization. Suspect 7 mm nonobstructing mid RIGHT renal calculus. Electronically Signed   By: Lavonia Dana M.D.   On: 07/29/2018 10:28   Last Hospital Admission:  US Abdomen Complete  Result Date: 07/29/2018 CLINICAL DATA:  Thrombocytopenia; history of renal failure, diabetes mellitus, GERD, hypertension, MI EXAM: ABDOMEN ULTRASOUND COMPLETE COMPARISON:  CT abdomen and pelvis 04/12/2009 FINDINGS: Gallbladder: Surgically absent Common bile duct: Diameter: Questionably visualized 3 mm diameter  Liver: Slightly increased parenchymal echogenicity question fatty infiltration though this can be seen with cirrhosis and certain infiltrative disorders. No focal hepatic mass or nodularity identified. No intrahepatic biliary dilatation. Portal vein is patent on color Doppler imaging with normal direction of blood flow towards the liver. IVC: Normal appearance Pancreas: Portion of pancreatic body and proximal tail normal appearance, remainder obscured by bowel gas Spleen: Normal appearance, 10.6 cm length Right Kidney: Length: 11.2 cm. Cortical thinning. Normal cortical echogenicity. Shadowing echogenic focus mid kidney question nonobstructing calculus 7 mm diameter. No mass or hydronephrosis. Left Kidney: Length: 11.7 cm. Cortical thinning. Normal cortical echogenicity. No mass or hydronephrosis Abdominal aorta: Visualized portion normal caliber, portions obscured by bowel gas. Other findings: No free fluid IMPRESSION: Post cholecystectomy. Question fatty infiltration of liver as above. Incomplete pancreatic and aortic visualization. Suspect 7 mm nonobstructing mid RIGHT renal calculus. Electronically Signed   By: Crist Infante.D.  On: 07/29/2018 10:28   Assessment  The primary encounter diagnosis was Chronic pain syndrome. Diagnoses of Chronic low back pain (Secondary Area of Pain) (Bilateral) (ML) (L>R) with sciatica (Left), Failed back surgical syndrome, Lumbosacral radiculopathy at L5, Foot drop (Left), Pharmacologic therapy, Chronic musculoskeletal pain, and Neurogenic pain were also pertinent to this visit.  Plan of Care  I have discontinued Chase Mendez. Chase "Mike"'s acetaminophen, oxyCODONE, and oxyCODONE. I have changed his methocarbamol to tiZANidine. I have also changed his oxyCODONE. Additionally, I am having him start on oxyCODONE, oxyCODONE, and gabapentin. Lastly, I am having him maintain his allopurinol, levothyroxine, atorvastatin, ondansetron, fluticasone, citalopram, linaclotide, vitamin E,  losartan, mirabegron ER, diphenhydrAMINE, naloxone, gabapentin, pantoprazole, isosorbide mononitrate, vitamin B-12, Testosterone, and aspirin.  Pharmacotherapy (Medications Ordered): Meds ordered this encounter  Medications  . oxyCODONE (ROXICODONE) 15 MG immediate release tablet    Sig: Take 1 tablet (15 mg total) by mouth every 6 (six) hours as needed for pain. Must last 30 days    Dispense:  120 tablet    Refill:  0    Chronic Pain: STOP Act (Not applicable) Fill 1 day early if closed on refill date. Do not fill until: 09/19/2018. To last until: 10/19/2018. Avoid benzodiazepines within 8 hours of opioids  . oxyCODONE (ROXICODONE) 15 MG immediate release tablet    Sig: Take 1 tablet (15 mg total) by mouth every 6 (six) hours as needed for pain. Must last 30 days    Dispense:  120 tablet    Refill:  0    Chronic Pain: STOP Act (Not applicable) Fill 1 day early if closed on refill date. Do not fill until: 10/19/2018. To last until: 11/18/2018. Avoid benzodiazepines within 8 hours of opioids  . oxyCODONE (ROXICODONE) 15 MG immediate release tablet    Sig: Take 1 tablet (15 mg total) by mouth every 6 (six) hours as needed for pain. Must last 30 days    Dispense:  120 tablet    Refill:  0    Chronic Pain: STOP Act (Not applicable) Fill 1 day early if closed on refill date. Do not fill until: 11/18/2018. To last until: 12/18/2018. Avoid benzodiazepines within 8 hours of opioids  . gabapentin (NEURONTIN) 600 MG tablet    Sig: Take 1 tablet (600 mg total) by mouth every 8 (eight) hours.    Dispense:  90 tablet    Refill:  2    Fill one day early if pharmacy is closed on scheduled refill date. May substitute for generic if available.  [name] tiZANidine (ZANAFLEX) 4 MG capsule    Sig: Take 1 capsule (4 mg total) by mouth 3 (three) times daily.    Dispense:  90 capsule    Refill:  2    Fill one day early if pharmacy is closed on scheduled refill date. May substitute for generic if available.   Orders:   Orders Placed This Encounter  Procedures  . ToxASSURE Select 13 (MW), Urine    Volume: 30 ml(s). Minimum 3 ml of urine is needed. Document temperature of fresh sample. Indications: Long term (current) use of opiate analgesic (H20.947)   Follow-up plan:   Return in about 14 weeks (around 12/18/2018) for (VV), E/M (MM) to evaluate the gabapentin increase and the switch from Robaxin to Zanaflex.      Interventional management options: Planned, scheduled, and/or pending:   Referral to Neurosurgeryfor permanent SCS implant.   (Trial done by me on 01/17/2018)   Considering:   Diagnostic caudal  epidural steroid injection + epidurogram Possible Racz procedure Therapeutic left L5 transforaminal ESI  Diagnostic bilateral lumbar facet block Possible bilateral lumbar facet RFA Possible candidate for intrathecal pump trial and implant Possible candidate for spinal cord stimulator trial and implant   Palliative PRN treatment(s):   Diagnostic left-sided L4 and [id number]#2    Recent Visits Date Type Provider Dept  06/18/18 Office Visit Vevelyn Francois, NP [facility]  Showing recent visits within past 90 days and meeting all other requirements   Today's Visits Date Type Provider Dept  09/11/18 Office Visit Milinda Pointer, MD [facility]  Showing today's visits and meeting all other requirements   Future Appointments No visits were found meeting these conditions.  Showing future appointments within next 90 days and meeting all other requirements   I discussed the assessment and treatment plan with the patient. The patient was provided an opportunity to ask questions and all were answered. The patient agreed with the plan and demonstrated an understanding of the instructions.  Patient advised to call back or seek an in-person evaluation if the symptoms or condition worsens.  Total duration of non-face-to-face encounter: 22 minutes.  Note by: Gaspar Cola, MD Date: 09/11/2018; Time: 11:17 AM  Note: This dictation was prepared with Dragon dictation. Any transcriptional errors that may result from this process are unintentional.  Disclaimer:  * Given the special circumstances of the COVID-19 pandemic, the federal government has announced that the Office for Civil Rights (OCR) will exercise its enforcement discretion and will not impose penalties on physicians using telehealth in the event of noncompliance with regulatory requirements under the [place] (HIPAA) in connection with the good faith provision of telehealth during the ZMCEY-22 national public health emergency. ([place])

## 2018-09-10 NOTE — Telephone Encounter (Signed)
Patient is requesting a lesi and there is not a prn order in.

## 2018-09-11 ENCOUNTER — Ambulatory Visit: Payer: Medicare Other | Attending: Pain Medicine | Admitting: Pain Medicine

## 2018-09-11 ENCOUNTER — Other Ambulatory Visit: Payer: Self-pay

## 2018-09-11 DIAGNOSIS — M5442 Lumbago with sciatica, left side: Secondary | ICD-10-CM | POA: Diagnosis not present

## 2018-09-11 DIAGNOSIS — M5417 Radiculopathy, lumbosacral region: Secondary | ICD-10-CM

## 2018-09-11 DIAGNOSIS — M792 Neuralgia and neuritis, unspecified: Secondary | ICD-10-CM

## 2018-09-11 DIAGNOSIS — M21372 Foot drop, left foot: Secondary | ICD-10-CM

## 2018-09-11 DIAGNOSIS — M961 Postlaminectomy syndrome, not elsewhere classified: Secondary | ICD-10-CM

## 2018-09-11 DIAGNOSIS — G894 Chronic pain syndrome: Secondary | ICD-10-CM | POA: Diagnosis not present

## 2018-09-11 DIAGNOSIS — Z79899 Other long term (current) drug therapy: Secondary | ICD-10-CM

## 2018-09-11 DIAGNOSIS — G8929 Other chronic pain: Secondary | ICD-10-CM

## 2018-09-11 DIAGNOSIS — M7918 Myalgia, other site: Secondary | ICD-10-CM

## 2018-09-11 MED ORDER — OXYCODONE HCL 15 MG PO TABS: 15.0000 mg | ORAL_TABLET | Freq: Four times a day (QID) | ORAL | 0 refills | Status: DC | PRN

## 2018-09-11 MED ORDER — TIZANIDINE HCL 4 MG PO CAPS: 4.0000 mg | ORAL_CAPSULE | Freq: Three times a day (TID) | ORAL | 2 refills | Status: DC

## 2018-09-11 MED ORDER — GABAPENTIN 600 MG PO TABS: 600.0000 mg | ORAL_TABLET | Freq: Three times a day (TID) | ORAL | 2 refills | Status: DC

## 2018-09-13 ENCOUNTER — Emergency Department
Admission: EM | Admit: 2018-09-13 | Discharge: 2018-09-13 | Disposition: A | Discharge status: Home / Self Care | Payer: Medicare Other | Attending: Emergency Medicine | Admitting: Emergency Medicine

## 2018-09-13 ENCOUNTER — Other Ambulatory Visit: Payer: Self-pay

## 2018-09-13 ENCOUNTER — Encounter: Payer: Self-pay | Admitting: Emergency Medicine

## 2018-09-13 DIAGNOSIS — Z79899 Other long term (current) drug therapy: Secondary | ICD-10-CM | POA: Diagnosis not present

## 2018-09-13 DIAGNOSIS — S91115A Laceration without foreign body of left lesser toe(s) without damage to nail, initial encounter: Secondary | ICD-10-CM | POA: Diagnosis present

## 2018-09-13 DIAGNOSIS — Y999 Unspecified external cause status: Secondary | ICD-10-CM | POA: Insufficient documentation

## 2018-09-13 DIAGNOSIS — X58XXXA Exposure to other specified factors, initial encounter: Secondary | ICD-10-CM | POA: Insufficient documentation

## 2018-09-13 DIAGNOSIS — F17290 Nicotine dependence, other tobacco product, uncomplicated: Secondary | ICD-10-CM | POA: Insufficient documentation

## 2018-09-13 DIAGNOSIS — Y9389 Activity, other specified: Secondary | ICD-10-CM | POA: Diagnosis not present

## 2018-09-13 DIAGNOSIS — I10 Essential (primary) hypertension: Secondary | ICD-10-CM | POA: Diagnosis not present

## 2018-09-13 DIAGNOSIS — I252 Old myocardial infarction: Secondary | ICD-10-CM | POA: Insufficient documentation

## 2018-09-13 DIAGNOSIS — E039 Hypothyroidism, unspecified: Secondary | ICD-10-CM | POA: Insufficient documentation

## 2018-09-13 DIAGNOSIS — M21372 Foot drop, left foot: Secondary | ICD-10-CM | POA: Insufficient documentation

## 2018-09-13 DIAGNOSIS — Y92002 Bathroom of unspecified non-institutional (private) residence single-family (private) house as the place of occurrence of the external cause: Secondary | ICD-10-CM | POA: Insufficient documentation

## 2018-09-13 DIAGNOSIS — Z7982 Long term (current) use of aspirin: Secondary | ICD-10-CM | POA: Insufficient documentation

## 2018-09-13 DIAGNOSIS — E119 Type 2 diabetes mellitus without complications: Secondary | ICD-10-CM | POA: Insufficient documentation

## 2018-09-13 MED ORDER — CEPHALEXIN 500 MG PO CAPS: 500.0000 mg | ORAL_CAPSULE | Freq: Three times a day (TID) | ORAL | 0 refills | Status: AC

## 2018-09-13 MED ORDER — LIDOCAINE HCL (PF) 1 % IJ SOLN
5.0000 mL | Freq: Once | INTRAMUSCULAR | Status: AC
  Administered 2018-09-13: 5 mL via INTRADERMAL
  Filled 2018-09-13: qty 5

## 2018-09-13 NOTE — Discharge Instructions (Addendum)
Follow-up with your regular doctor for suture removal in 10 to 14 days.  Take the antibiotic as prescribed.  Return emergency department if any sign of infection or if worsening.

## 2018-09-13 NOTE — ED Notes (Signed)
Pt alert and oriented X4, active, cooperative, pt in NAD. RR even and unlabored, color WNL.  Pt informed to return if any life threatening symptoms occur.  Discharge and followup instructions reviewed.  

## 2018-09-13 NOTE — ED Provider Notes (Signed)
[facility] Emergency Department Provider Note  ____________________________________________   First MD Initiated Contact with Patient 09/13/18 1333     (approximate)  I have reviewed the triage vital signs and the nursing notes.   HISTORY  Chief Complaint Toe Injury    HPI Chase Mendez is a 84 y.o. male presents emergency department complaining of a laceration to the left second toe.  He has dropfoot and has had several lacerations to the toe.  Tdap is up-to-date.  He denies any injuries.    Past Medical History:  Diagnosis Date  . Acute encephalopathy 12/08/2014  . Anxiety   . ARF (acute renal failure) ([place]) 12/08/2014  . Back pain   . Benign neoplasm of large bowel   . Capsulitis    fractured displaced metatarsal with capsulitis  . Chronic back pain   . Depression   . Diabetes mellitus without complication (HCC)    no medications currently  . Dysphagia   . Exostosis    painful, right hallux  . Foot drop, left    wears a brace  . Frequent falls 02/2018   poor balance, foot drop  . GERD (gastroesophageal reflux disease)   . Gout   . Hypertension   . Hypothyroidism   . Insomnia   . Low testosterone   . Microscopic hematuria 2016  . Myocardial infarction [facility])    patient unaware when it happened years ago.    . Pneumonia 12/07/2014  . Pressure ulcer 12/09/2014  . Sepsis ([place]) 12/08/2014  . Thyroid disease     Patient Active Problem List   Diagnosis Date Noted  . Chronic musculoskeletal pain 09/11/2018  . Neurogenic pain 09/11/2018  . Claustrophobia 01/23/2018  . Thoracic central spinal stenosis 01/22/2018  . Cellulitis of right hand 12/06/2017  . Chronic pain syndrome 07/11/2017  . DDD (degenerative disc disease), lumbar 06/19/2017  . Opioid Drug tolerance 06/10/2017  . History of acute renal failure (12/08/2014) 06/10/2017  . History of encephalopathy (12/08/2014) 06/10/2017  . History of sepsis (12/08/2014) 06/10/2017  .  Lower limb pain, inferior, left (L5) (Foot Drop) 05/16/2017  . Chronic low back pain (Secondary Area of Pain) (Bilateral) (ML) (L>R) with sciatica (Left) 05/16/2017  . Chronic lower extremity pain (Primary Area of Pain) (Left) 05/16/2017  . 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 Foot drop (Left) 05/16/2017  . Lumbosacral radiculopathy at L5 05/16/2017  . Disorder of skeletal system 05/16/2017  . Pharmacologic therapy 05/16/2017  . Problems influencing health status 05/16/2017  . Venous reflux 07/12/2016  . 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 Varicose veins of both lower extremities with pain 05/09/2016  . ARF (acute renal failure) ([place]) 12/08/2014  . Pneumonia 12/07/2014    Past Surgical History:  Procedure Laterality Date  . APPENDECTOMY    . [place]   x 2  . CHOLECYSTECTOMY    . ESOPHAGOGASTRODUODENOSCOPY (EGD) WITH PROPOFOL N/A 04/24/2017   Procedure: ESOPHAGOGASTRODUODENOSCOPY (EGD) WITH PROPOFOL;  Surgeon: Manya Silvas, MD;  Location: [facility] ENDOSCOPY;  Service: Endoscopy;  Laterality: N/A;  . EYE SURGERY Bilateral 1983, 1985   cataract extractions  . SPINAL CORD STIMULATOR INSERTION N/A 03/13/2018   Procedure: SPINAL CORD STIMULATOR INSERTION;  Surgeon: Meade Maw, MD;  Location: [facility] ORS;  Service: Neurosurgery;  Laterality: N/A;    Prior to Admission medications   Medication Sig Start Date End Date Taking? Authorizing Provider  allopurinol (ZYLOPRIM) 100 MG tablet Take 100 mg by mouth daily. 10/26/14  [provider]  aspirin 81 MG tablet Take 81 mg by mouth daily.    [provider]  atorvastatin (LIPITOR) 20 MG tablet Take 20 mg by mouth daily.     [provider]  cephALEXin (KEFLEX) 500 MG capsule Take 1 capsule (500 mg total) by mouth 3 (three) times daily for 7 days. 09/13/18 09/20/18  Baylea Milburn, Linden Dolin, PA-C  citalopram (CELEXA) 40 MG tablet Take 40 mg by mouth daily.     [provider]   diphenhydrAMINE (BENADRYL) 25 MG tablet Take 25 mg by mouth 2 (two) times daily.     [provider]  fluticasone (FLONASE) 50 MCG/ACT nasal spray Place 1 spray into both nostrils daily as needed for rhinitis.     [provider]  gabapentin (NEURONTIN) 400 MG capsule Take 400 mg by mouth 3 (three) times daily.    [provider]  gabapentin (NEURONTIN) 600 MG tablet Take 1 tablet (600 mg total) by mouth every 8 (eight) hours. 09/11/18 12/10/18  Milinda Pointer, MD  isosorbide mononitrate (IMDUR) 30 MG 24 hr tablet Take 30 mg by mouth daily.    [provider]  levothyroxine (SYNTHROID, LEVOTHROID) 125 MCG tablet Take 125 mcg by mouth daily before breakfast.     [provider]  linaclotide (LINZESS) 290 MCG CAPS capsule Take 290 mcg by mouth daily as needed (for constipation).     [provider]  losartan (COZAAR) 50 MG tablet Take 50 mg by mouth daily.    [provider]  mirabegron ER (MYRBETRIQ) 25 MG TB24 tablet Take 25 mg by mouth daily.    [provider]  naloxone [facility]) nasal spray 4 mg/0.1 mL Spray into one nostril. Repeat with second device into other nostril after 2-3 minutes if no or minimal response. Use in case of opioid overdose. Patient taking differently: Place 1 spray into the nose once. Spray into one nostril. Repeat with second device into other nostril after 2-3 minutes if no or minimal response. Use in case of opioid overdose. 09/04/17   Vevelyn Francois, NP  ondansetron (ZOFRAN ODT) 4 MG disintegrating tablet Take 1 tablet (4 mg total) by mouth every 8 (eight) hours as needed for nausea or vomiting. 08/21/16   Darel Hong, MD  oxyCODONE (ROXICODONE) 15 MG immediate release tablet Take 1 tablet (15 mg total) by mouth every 6 (six) hours as needed for pain. Must last 30 days 09/19/18 10/19/18  Milinda Pointer, MD  oxyCODONE (ROXICODONE) 15 MG immediate release tablet Take 1 tablet (15 mg total) by mouth  every 6 (six) hours as needed for pain. Must last 30 days 10/19/18 11/18/18  Milinda Pointer, MD  oxyCODONE (ROXICODONE) 15 MG immediate release tablet Take 1 tablet (15 mg total) by mouth every 6 (six) hours as needed for pain. Must last 30 days 11/18/18 12/18/18  Milinda Pointer, MD  pantoprazole (PROTONIX) 40 MG tablet Take 40 mg by mouth 2 (two) times daily. 02/04/18 07/24/18  [provider]  Testosterone (ANDROGEL) 40.5 MG/2.5GM (1.62%) GEL Place 2 Pump onto the skin 3 (three) times a week.    [provider]  tiZANidine (ZANAFLEX) 4 MG capsule Take 1 capsule (4 mg total) by mouth 3 (three) times daily. 09/11/18 12/10/18  Milinda Pointer, MD  vitamin B-12 (CYANOCOBALAMIN) 1000 MCG tablet Take 1,000 mcg by mouth daily.    [provider]  vitamin E 400 UNIT capsule Take 400 Units by mouth daily.    [provider]  Allergies Ibuprofen, Sulfa antibiotics, and Sulfasalazine  Family History  Problem Relation Age of Onset  . Heart disease Mother   . Diabetes Father   . Kidney cancer Brother     Social History Social History   Tobacco Use  . Smoking status: Current Every Day Smoker    Packs/day: 0.50    Years: 70.00    Pack years: 35.00    Types: Cigars  . Smokeless tobacco: Never Used  . Tobacco comment: unable to give cessation materials due to webex visit   Substance Use Topics  . Alcohol use: No  . Drug use: Yes    Types: Oxycodone    Review of Systems  Constitutional: No fever/chills Eyes: No visual changes. ENT: No sore throat. Respiratory: Denies cough Genitourinary: Negative for dysuria. Musculoskeletal: Negative for back pain.  Positive laceration to the left second toe Skin: Negative for rash.    ____________________________________________   PHYSICAL EXAM:  VITAL SIGNS: ED Triage Vitals  Enc Vitals Group     BP 09/13/18 1314 (!) 147/63     Pulse Rate 09/13/18 1314 67     Resp 09/13/18 1314 16     Temp  09/13/18 1314 99 F (37.2 C)     Temp Source 09/13/18 1314 Oral     SpO2 09/13/18 1314 96 %     Weight 09/13/18 1316 207 lb (93.9 kg)     Height 09/13/18 1316 6' (1.829 m)     Head Circumference --      Peak Flow --      Pain Score 09/13/18 1315 4     Pain Loc --      Pain Edu? --      Excl. in [place]? --     Constitutional: Alert and oriented. Well appearing and in no acute distress. Eyes: Conjunctivae are normal.  Head: Atraumatic. Nose: No congestion/rhinnorhea. Mouth/Throat: Mucous membranes are moist.   Neck:  supple no lymphadenopathy noted Cardiovascular: Normal rate, regular rhythm.  Respiratory: Normal respiratory effort.  No retractions GU: deferred Musculoskeletal: FROM all extremities, warm and well perfused, left second toe has a laceration across the dorsum, the area is deep at the joint, neurovascular is intact Neurologic:  Normal speech and language.  Skin:  Skin is warm, dry . No rash noted. Psychiatric: Mood and affect are normal. Speech and behavior are normal.  ____________________________________________   LABS (all labs ordered are listed, but only abnormal results are displayed)  Labs Reviewed - No data to display ____________________________________________   ____________________________________________  RADIOLOGY    ____________________________________________   PROCEDURES  Procedure(s) performed:   Marland KitchenMarland KitchenLaceration Repair  Date/Time: 09/13/2018 2:45 PM Performed by: Versie Starks, PA-C Authorized by: Versie Starks, PA-C   Consent:    Consent obtained:  Verbal   Consent given by:  Patient   Risks discussed:  Pain, poor wound healing, nerve damage, tendon damage, vascular damage and retained foreign body   Alternatives discussed:  No treatment Anesthesia (see MAR for exact dosages):    Anesthesia method:  Nerve block   Block needle gauge:  27 G   Block anesthetic:  Lidocaine 1% w/o epi   Block injection procedure:  Anatomic landmarks  identified, introduced needle, incremental injection, anatomic landmarks palpated and negative aspiration for blood   Block outcome:  Anesthesia achieved Laceration details:    Location:  Toe   Toe location:  L second toe   Length (cm):  1   Depth (mm):  4 Repair type:  Repair type:  Simple Pre-procedure details:    Preparation:  Patient was prepped and draped in usual sterile fashion Exploration:    Hemostasis achieved with:  Direct pressure   Wound exploration: wound explored through full range of motion     Wound extent: no foreign bodies/material noted, no muscle damage noted and no tendon damage noted     Contaminated: no   Treatment:    Area cleansed with:  Betadine and saline   Amount of cleaning:  Standard   Irrigation solution:  Sterile saline   Irrigation method:  Tap Skin repair:    Repair method:  Sutures   Suture size:  5-0   Suture material:  Nylon   Suture technique:  Simple interrupted   Number of sutures:  3 Approximation:    Approximation:  Close Post-procedure details:    Dressing:  Non-adherent dressing   Patient tolerance of procedure:  Tolerated well, no immediate complications      ____________________________________________   INITIAL IMPRESSION / ASSESSMENT AND PLAN / ED COURSE  Pertinent labs & imaging results that were available during my care of the patient were reviewed by me and considered in my medical decision making (see chart for details).   The patient is a 84 year old male presents emergency department with laceration to the left second toe.  Tdap is up-to-date.  Physical exam shows a 1 cm laceration across the dorsum of the left second toe See procedure note for repair  Nursing staff applied a dressing.  Patient was placed on antibiotic, Keflex 3 times daily for 7 days.  He is to follow-up with his regular doctor in 10 to 14 days for suture removal.  States he understands will comply.  Is discharged in stable condition.     DELOY ARCHEY was evaluated in Emergency Department on 09/13/2018 for the symptoms described in the history of present illness. He was evaluated in the context of the global COVID-19 pandemic, which necessitated consideration that the patient might be at risk for infection with the SARS-CoV-2 virus that causes COVID-19. Institutional protocols and algorithms that pertain to the evaluation of patients at risk for COVID-19 are in a state of rapid change based on information released by regulatory bodies including the CDC and federal and state organizations. These policies and algorithms were followed during the patient's care in the ED.   As part of my medical decision making, I reviewed the following data within the [place] notes reviewed and incorporated, Old chart reviewed, Notes from prior ED visits and [place] Controlled Substance Database  ____________________________________________   FINAL CLINICAL IMPRESSION(S) / ED DIAGNOSES  Final diagnoses:  Laceration of lesser toe of left foot without foreign body present, nail damage status unspecified, initial encounter      NEW MEDICATIONS STARTED DURING THIS VISIT:  New Prescriptions   CEPHALEXIN (KEFLEX) 500 MG CAPSULE    Take 1 capsule (500 mg total) by mouth 3 (three) times daily for 7 days.     Note:  This document was prepared using Dragon voice recognition software and may include unintentional dictation errors.    Versie Starks, PA-C 09/13/18 1449    Duffy Bruce, MD 09/13/18 [phone]

## 2018-09-13 NOTE — ED Triage Notes (Signed)
Pt to ED via POV, pt has hx/o foot drop on left foot. Pt was coming out of the bathroom and injured his toe. Pt states that they were unable to get the bleeding to stop. Pt is in NAD.

## 2018-09-16 NOTE — Telephone Encounter (Signed)
I called the patient and he said he didn't want an epidural.

## 2018-10-10 ENCOUNTER — Ambulatory Visit: Payer: PRIVATE HEALTH INSURANCE | Admitting: Pain Medicine

## 2018-10-22 ENCOUNTER — Other Ambulatory Visit: Payer: Self-pay

## 2018-10-22 ENCOUNTER — Encounter: Payer: Self-pay | Admitting: Pain Medicine

## 2018-10-22 ENCOUNTER — Ambulatory Visit: Payer: Worker's Compensation | Attending: Pain Medicine | Admitting: Pain Medicine

## 2018-10-22 VITALS — BP 128/89 | HR 76 | Temp 98.7°F | Resp 18 | Ht 72.0 in | Wt 203.0 lb

## 2018-10-22 DIAGNOSIS — M5442 Lumbago with sciatica, left side: Secondary | ICD-10-CM

## 2018-10-22 DIAGNOSIS — M961 Postlaminectomy syndrome, not elsewhere classified: Secondary | ICD-10-CM | POA: Diagnosis not present

## 2018-10-22 DIAGNOSIS — Z462 Encounter for fitting and adjustment of other devices related to nervous system and special senses: Secondary | ICD-10-CM

## 2018-10-22 DIAGNOSIS — M79605 Pain in left leg: Secondary | ICD-10-CM | POA: Diagnosis present

## 2018-10-22 DIAGNOSIS — Z9682 Presence of neurostimulator: Secondary | ICD-10-CM | POA: Diagnosis present

## 2018-10-22 DIAGNOSIS — M792 Neuralgia and neuritis, unspecified: Secondary | ICD-10-CM | POA: Diagnosis present

## 2018-10-22 DIAGNOSIS — G8929 Other chronic pain: Secondary | ICD-10-CM

## 2018-10-22 DIAGNOSIS — M5417 Radiculopathy, lumbosacral region: Secondary | ICD-10-CM | POA: Diagnosis present

## 2018-10-22 DIAGNOSIS — T85192A Other mechanical complication of implanted electronic neurostimulator (electrode) of spinal cord, initial encounter: Secondary | ICD-10-CM | POA: Insufficient documentation

## 2018-10-22 DIAGNOSIS — T85192S Other mechanical complication of implanted electronic neurostimulator (electrode) of spinal cord, sequela: Secondary | ICD-10-CM

## 2018-10-22 DIAGNOSIS — G894 Chronic pain syndrome: Secondary | ICD-10-CM | POA: Diagnosis not present

## 2018-10-22 MED ORDER — GABAPENTIN 600 MG PO TABS: 600.0000 mg | ORAL_TABLET | Freq: Four times a day (QID) | ORAL | 2 refills | Status: DC

## 2018-10-22 NOTE — Progress Notes (Signed)
Patient's Name: Chase Mendez  MRN: [id number]  Referring Provider: Jodi Marble, MD  DOB: 1934/10/17  PCP: Jodi Marble, MD  DOS: 10/22/2018  Note by: Gaspar Cola, MD  Service setting: Ambulatory outpatient  Attending: Gaspar Cola, MD  Location: [facility] (AMB) Pain Management Facility  Specialty: Interventional Pain Management  Patient type: Established   CC: Leg Pain (left, upper and lower)  HPI: Chase Mendez is a 84 y.o. year old, male patient, who comes today complaining of Leg Pain (left, upper and lower) His last contact with [place] was on 09/11/2018. I personally saw him on 09/11/2018. Severity of the pain is described as a 4 /10. His pain is located in the area of the Leg Left, Upper, Lower and n/a. Onset: More than a month ago. Pain is descriptors include: Sharp. Tempo is described as: Intermittent. Modifying factors: medications.  Today the patient has indicated that he is still having quite a bit of pain in his back and he was wondering if we could increase his oxycodone.  I have informed him that rather than doing this, I much rather work on optimizing the membrane stabilizers to better cover the intermittent neuropathic component of his pain since the opioids I do not believe that they would be very effective in doing that.  On his last visit, we had increased his gabapentin from 400 mg 3 times daily to 600 mg 3 times daily and he seems to have tolerated this very well.  He indicates that this has helped to some degree.  He is currently not very stable on his feet, but this is something that he indicates having had since his fall, a couple years ago.  I specifically asked him if the gabapentin had worsened this and he indicated that he did not think so.  Today we will increase the gabapentin further to 600 mg p.o. every 6 hours (4 times a day).  If this does not seem to help him significantly, the other alternative is to switch him to the Lyrica.  Exam: Chase Mendez   height is 6' (1.829 m) and weight is 203 lb (92.1 kg). His oral temperature is 98.7 F (37.1 C). His blood pressure is 128/89 and his pulse is 76. His respiration is 18 and oxygen saturation is 93%.  Body mass index is 27.53 kg/m.  A/P: The primary encounter diagnosis was Chronic pain syndrome. Diagnoses of Chronic lower extremity pain (Primary Area of Pain) (Left), Chronic low back pain (Secondary Area of Pain) (Bilateral) (ML) (L>R) with sciatica (Left), Failed back surgical syndrome, Lower limb pain, inferior, left (L5) (Foot Drop), Lumbosacral radiculopathy at L5, Presence of neurostimulator, Malfunction of spinal cord stimulator, sequela, Encounter for interrogation of neurostimulator, and Neurogenic pain were also pertinent to this visit. I have discontinued Chase Mendez. Chase "Mike"'s gabapentin. I have also changed his gabapentin. Additionally, I am having him maintain his allopurinol, levothyroxine, atorvastatin, ondansetron, fluticasone, citalopram, linaclotide, vitamin E, losartan, mirabegron ER, diphenhydrAMINE, naloxone, pantoprazole, isosorbide mononitrate, vitamin B-12, Testosterone, aspirin, oxyCODONE, oxyCODONE, oxyCODONE, and tiZANidine. Return for scheduled appointment.  Neuromodulation Implant Therapy Assessment  Epidural Neurostimulator implant: Side-effects or Adverse reactions: None reported Effectiveness: Described as relatively effective, allowing for increase in activities of daily living (ADL).  However, he indicates that the stimulation does not seem to fully recover his pain on one side. Plan: Analysis and programming.  Today we have reprogrammed the device in an effort to get better stimulation and better coverage.  By  the time the patient left the clinic he indicated that his pain was doing much better.  Plan of Care  Orders:  Orders Placed This Encounter  Procedures  . Spinal Cord Stimulator Analysis  . SCS REPROGRAM    Please prevent the findings and scanned them into  the EMR.    Scheduling Instructions:     Please get a readout of the neurostimulator programming and include the following: Lead impedance, and battery life.    Order Specific Question:   Where will this procedure be performed?    Answer:   [facility]   Chronic Opioid Analgesic:  Oxycodone IR 15 mg, 1 tab PO q 6 hrs. (60 mg/day of oxycodone) MME/day:90mg /day.   Medications administered: Chase Mendez. Chase [place] "Ronalee Belts" had no medications administered during this visit.  See the medical record for exact dosing, route, and time of administration.  Follow-up plan:   Return for scheduled appointment.       Interventional management options: Planned, scheduled, and/or pending:   Referral to Neurosurgeryfor permanent SCS implant.   (Trial done by me on 01/17/2018)   Considering:   Diagnostic caudal epidural steroid injection + epidurogram Possible Racz procedure Therapeutic left L5 transforaminal ESI  Diagnostic bilateral lumbar facet block Possible bilateral lumbar facet RFA Possible candidate for intrathecal pump trial and implant Possible candidate for spinal cord stimulator trial and implant   Palliative PRN treatment(s):   Diagnostic left-sided L4 and [id number]#2    Recent Visits Date Type Provider Dept  09/11/18 Office Visit Milinda Pointer, MD [facility]  Showing recent visits within past 90 days and meeting all other requirements   Today's Visits Date Type Provider Dept  10/22/18 Procedure visit Milinda Pointer, MD [facility]  Showing today's visits and meeting all other requirements   Future Appointments Date Type Provider Dept  12/16/18 Appointment Milinda Pointer, MD [facility]  Showing future appointments within next 90 days and meeting all other requirements   Disposition: Discharge home  Discharge Date & Time: 10/22/2018; 1250 hrs.   Primary Care Physician: Jodi Marble, MD Location: [facility] Note by: Gaspar Cola, MD Date: 10/22/2018; Time: 1:29 PM

## 2018-10-22 NOTE — Patient Instructions (Signed)
A prescription for Gabapentin has been sent to your pharmacy.

## 2018-10-22 NOTE — Progress Notes (Signed)
Nursing Pain Medication Assessment:  Safety precautions to be maintained throughout the outpatient stay will include: orient to surroundings, keep bed in low position, maintain call bell within reach at all times, provide assistance with transfer out of bed and ambulation.  Medication Inspection Compliance: Pill count conducted under aseptic conditions, in front of the patient. Neither the pills nor the bottle was removed from the patient's sight at any time. Once count was completed pills were immediately returned to the patient in their original bottle.  Medication: Oxycodone IR Pill/Patch Count: 119 of 120 pills remain Pill/Patch Appearance: Markings consistent with prescribed medication Bottle Appearance: Standard pharmacy container. Clearly labeled. Filled Date: 09/14 / 2020 Last Medication intake:  Today

## 2018-11-04 ENCOUNTER — Other Ambulatory Visit: Payer: Self-pay

## 2018-11-04 DIAGNOSIS — Z20822 Contact with and (suspected) exposure to covid-19: Secondary | ICD-10-CM

## 2018-11-05 LAB — NOVEL CORONAVIRUS, NAA: SARS-CoV-2, NAA: NOT DETECTED

## 2018-11-11 ENCOUNTER — Other Ambulatory Visit: Payer: Self-pay

## 2018-11-11 DIAGNOSIS — Z20822 Contact with and (suspected) exposure to covid-19: Secondary | ICD-10-CM

## 2018-11-13 LAB — NOVEL CORONAVIRUS, NAA: SARS-CoV-2, NAA: DETECTED — AB

## 2018-11-28 ENCOUNTER — Emergency Department: Payer: Medicare Other

## 2018-11-28 ENCOUNTER — Encounter: Payer: Self-pay | Admitting: Emergency Medicine

## 2018-11-28 ENCOUNTER — Other Ambulatory Visit: Payer: Self-pay

## 2018-11-28 ENCOUNTER — Emergency Department
Admission: EM | Admit: 2018-11-28 | Discharge: 2018-11-28 | Disposition: A | Discharge status: Home / Self Care | Payer: Medicare Other | Attending: Emergency Medicine | Admitting: Emergency Medicine

## 2018-11-28 DIAGNOSIS — F17210 Nicotine dependence, cigarettes, uncomplicated: Secondary | ICD-10-CM | POA: Insufficient documentation

## 2018-11-28 DIAGNOSIS — R410 Disorientation, unspecified: Secondary | ICD-10-CM | POA: Diagnosis not present

## 2018-11-28 DIAGNOSIS — E039 Hypothyroidism, unspecified: Secondary | ICD-10-CM | POA: Insufficient documentation

## 2018-11-28 DIAGNOSIS — I10 Essential (primary) hypertension: Secondary | ICD-10-CM | POA: Insufficient documentation

## 2018-11-28 DIAGNOSIS — I252 Old myocardial infarction: Secondary | ICD-10-CM | POA: Diagnosis not present

## 2018-11-28 DIAGNOSIS — E119 Type 2 diabetes mellitus without complications: Secondary | ICD-10-CM | POA: Diagnosis not present

## 2018-11-28 DIAGNOSIS — R4182 Altered mental status, unspecified: Secondary | ICD-10-CM | POA: Diagnosis present

## 2018-11-28 HISTORY — DX: Coronavirus infection, unspecified: B34.2

## 2018-11-28 LAB — URINALYSIS, COMPLETE (UACMP) WITH MICROSCOPIC
Bacteria, UA: NONE SEEN
Bilirubin Urine: NEGATIVE
Glucose, UA: NEGATIVE mg/dL
Ketones, ur: NEGATIVE mg/dL
Leukocytes,Ua: NEGATIVE
Nitrite: NEGATIVE
Protein, ur: NEGATIVE mg/dL
Specific Gravity, Urine: 1.013 (ref 1.005–1.030)
Squamous Epithelial / HPF: NONE SEEN (ref 0–5)
pH: 5 (ref 5.0–8.0)

## 2018-11-28 LAB — COMPREHENSIVE METABOLIC PANEL
ALT: 20 U/L (ref 0–44)
AST: 75 U/L — ABNORMAL HIGH (ref 15–41)
Albumin: 3.9 g/dL (ref 3.5–5.0)
Alkaline Phosphatase: 134 U/L — ABNORMAL HIGH (ref 38–126)
Anion gap: 10 (ref 5–15)
BUN: 26 mg/dL — ABNORMAL HIGH (ref 8–23)
CO2: 27 mmol/L (ref 22–32)
Calcium: 9 mg/dL (ref 8.9–10.3)
Chloride: 102 mmol/L (ref 98–111)
Creatinine, Ser: 1.32 mg/dL — ABNORMAL HIGH (ref 0.61–1.24)
GFR calc Af Amer: 58 mL/min — ABNORMAL LOW (ref >60)
GFR calc non Af Amer: 50 mL/min — ABNORMAL LOW (ref >60)
Glucose, Bld: 141 mg/dL — ABNORMAL HIGH (ref 70–99)
Potassium: 4 mmol/L (ref 3.5–5.1)
Sodium: 139 mmol/L (ref 135–145)
Total Bilirubin: 1.5 mg/dL — ABNORMAL HIGH (ref 0.3–1.2)
Total Protein: 7.2 g/dL (ref 6.5–8.1)

## 2018-11-28 LAB — CBC WITH DIFFERENTIAL/PLATELET
Abs Immature Granulocytes: 0.03 10*3/uL (ref 0.00–0.07)
Basophils Absolute: 0 10*3/uL (ref 0.0–0.1)
Basophils Relative: 1 %
Eosinophils Absolute: 0.2 10*3/uL (ref 0.0–0.5)
Eosinophils Relative: 3 %
HCT: 41 % (ref 39.0–52.0)
Hemoglobin: 13.6 g/dL (ref 13.0–17.0)
Immature Granulocytes: 0 %
Lymphocytes Relative: 11 %
Lymphs Abs: 0.9 10*3/uL (ref 0.7–4.0)
MCH: 29.6 pg (ref 26.0–34.0)
MCHC: 33.2 g/dL (ref 30.0–36.0)
MCV: 89.3 fL (ref 80.0–100.0)
Monocytes Absolute: 0.9 10*3/uL (ref 0.1–1.0)
Monocytes Relative: 10 %
Neutro Abs: 6.4 10*3/uL (ref 1.7–7.7)
Neutrophils Relative %: 75 %
Platelets: 157 10*3/uL (ref 150–400)
RBC: 4.59 MIL/uL (ref 4.22–5.81)
RDW: 13.3 % (ref 11.5–15.5)
WBC: 8.4 10*3/uL (ref 4.0–10.5)
nRBC: 0 % (ref 0.0–0.2)

## 2018-11-28 LAB — GLUCOSE, CAPILLARY: Glucose-Capillary: 133 mg/dL — ABNORMAL HIGH (ref 70–99)

## 2018-11-28 LAB — TROPONIN I (HIGH SENSITIVITY): Troponin I (High Sensitivity): 13 ng/L (ref <18)

## 2018-11-28 MED ORDER — SODIUM CHLORIDE 0.9 % IV SOLN
Freq: Once | INTRAVENOUS | Status: AC
  Administered 2018-11-28: 1000 mL via INTRAVENOUS

## 2018-11-28 NOTE — ED Notes (Signed)
EDP at bedside at this time.  

## 2018-11-28 NOTE — ED Notes (Signed)
Patient transported to CT 

## 2018-11-28 NOTE — ED Notes (Signed)
Pt wife at bedside. Pt aware that he needs to urinate for sample-urinal at bedside and pt vocalizes understanding.

## 2018-11-28 NOTE — ED Notes (Addendum)
Pt does not wear oxygen at home, turned off and will monitor saturations. Will speak with EDP regarding. Turned off oxygen at this time, monitoring pt continuous oxygen saturation.

## 2018-11-28 NOTE — ED Notes (Signed)
Pt oxygen continues to stay 94-98% on RA when patient keeping finger still with good waveform on monitor. Pt in NAD, RR even and unlabored. Denies pain, wife at bedside.

## 2018-11-28 NOTE — ED Provider Notes (Signed)
[facility] Emergency Department Provider Note       Time seen: ----------------------------------------- 9:20 AM on 11/28/2018 -----------------------------------------   I have reviewed the triage vital signs and the nursing notes.  HISTORY   Chief Complaint Altered Mental Status   HPI Chase Mendez is a 84 y.o. male with a history of encephalopathy, anxiety, renal failure, diabetes, hypertension, MI who presents to the ED for altered renal status.  Wife states has been intermittently confused for the last 3 weeks.  He has had 2 more falls since then.  Recently was seen by his primary care doctor who discovered he had a UTI, he started Macrobid last night and has had 2 doses.  He was told he may need IV antibiotics and to come to the ER.  Patient was positive for Covid several weeks ago.  Past Medical History:  Diagnosis Date  . Acute encephalopathy 12/08/2014  . Anxiety   . ARF (acute renal failure) ([place]) 12/08/2014  . Back pain   . Benign neoplasm of large bowel   . Capsulitis    fractured displaced metatarsal with capsulitis  . Chronic back pain   . Coronavirus infection   . Depression   . Diabetes mellitus without complication (HCC)    no medications currently  . Dysphagia   . Exostosis    painful, right hallux  . Foot drop, left    wears a brace  . Frequent falls 02/2018   poor balance, foot drop  . GERD (gastroesophageal reflux disease)   . Gout   . Hypertension   . Hypothyroidism   . Insomnia   . Low testosterone   . Microscopic hematuria 2016  . Myocardial infarction [facility])    patient unaware when it happened years ago.    . Pneumonia 12/07/2014  . Pressure ulcer 12/09/2014  . Sepsis ([place]) 12/08/2014  . Thyroid disease     Patient Active Problem List   Diagnosis Date Noted  . Presence of neurostimulator 10/22/2018  . Spinal cord stimulator dysfunction ([place]) 10/22/2018  . Encounter for interrogation of neurostimulator  10/22/2018  . Chronic musculoskeletal pain 09/11/2018  . Neurogenic pain 09/11/2018  . Claustrophobia 01/23/2018  . Thoracic central spinal stenosis 01/22/2018  . Cellulitis of right hand 12/06/2017  . Chronic pain syndrome 07/11/2017  . DDD (degenerative disc disease), lumbar 06/19/2017  . Opioid Drug tolerance 06/10/2017  . History of acute renal failure (12/08/2014) 06/10/2017  . History of encephalopathy (12/08/2014) 06/10/2017  . History of sepsis (12/08/2014) 06/10/2017  . Lower limb pain, inferior, left (L5) ([place]) 05/16/2017  . Chronic low back pain (Secondary Area of Pain) (Bilateral) (ML) (L>R) with sciatica (Left) 05/16/2017  . Chronic lower extremity pain (Primary Area of Pain) (Left) 05/16/2017  . 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 Foot drop (Left) 05/16/2017  . Lumbosacral radiculopathy at L5 05/16/2017  . Disorder of skeletal system 05/16/2017  . Pharmacologic therapy 05/16/2017  . Problems influencing health status 05/16/2017  . Venous reflux 07/12/2016  . 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 Varicose veins of both lower extremities with pain 05/09/2016  . ARF (acute renal failure) ([place]) 12/08/2014  . Pneumonia 12/07/2014    Past Surgical History:  Procedure Laterality Date  . APPENDECTOMY    . [place]   x 2  . CHOLECYSTECTOMY    . ESOPHAGOGASTRODUODENOSCOPY (EGD) WITH PROPOFOL N/A 04/24/2017   Procedure: ESOPHAGOGASTRODUODENOSCOPY (EGD) WITH PROPOFOL;  Surgeon: Manya Silvas, MD;  Location: [facility]  ENDOSCOPY;  Service: Endoscopy;  Laterality: N/A;  . EYE SURGERY Bilateral 1983, 1985   cataract extractions  . SPINAL CORD STIMULATOR INSERTION N/A 03/13/2018   Procedure: SPINAL CORD STIMULATOR INSERTION;  Surgeon: Meade Maw, MD;  Location: [facility] ORS;  Service: Neurosurgery;  Laterality: N/A;    Allergies Ibuprofen, Sulfa antibiotics, and Sulfasalazine  Social History Social History   Tobacco Use  . Smoking status:  Current Every Day Smoker    Packs/day: 0.50    Years: 70.00    Pack years: 35.00    Types: Cigars  . Smokeless tobacco: Never Used  . Tobacco comment: unable to give cessation materials due to webex visit   Substance Use Topics  . Alcohol use: No  . Drug use: Yes    Types: Oxycodone   Review of Systems Constitutional: Negative for fever. Cardiovascular: Negative for chest pain. Respiratory: Negative for shortness of breath.  Positive for cough Gastrointestinal: Negative for abdominal pain, vomiting and diarrhea. Musculoskeletal: Negative for back pain. Skin: Negative for rash. Neurological: Positive for altered mental status  All systems negative/normal/unremarkable except as stated in the HPI  ____________________________________________   PHYSICAL EXAM:  VITAL SIGNS: ED Triage Vitals  Enc Vitals Group     BP 11/28/18 0905 134/74     Pulse Rate 11/28/18 0905 77     Resp 11/28/18 0905 18     Temp 11/28/18 0905 98.2 F (36.8 C)     Temp Source 11/28/18 0905 Oral     SpO2 11/28/18 0905 91 %     Weight --      Height --      Head Circumference --      Peak Flow --      Pain Score 11/28/18 0907 5     Pain Loc --      Pain Edu? --      Excl. in [place]? --    Constitutional: Alert and oriented. Well appearing and in no distress. Eyes: Conjunctivae are normal. Normal extraocular movements. ENT      Head: Normocephalic and atraumatic.      Nose: No congestion/rhinnorhea.      Mouth/Throat: Mucous membranes are moist.      Neck: No stridor. Cardiovascular: Normal rate, regular rhythm. No murmurs, rubs, or gallops. Respiratory: Normal respiratory effort without tachypnea nor retractions. Breath sounds are clear and equal bilaterally. No wheezes/rales/rhonchi. Gastrointestinal: Soft and nontender. Normal bowel sounds Musculoskeletal: Nontender with normal range of motion in extremities. No lower extremity tenderness nor edema. Neurologic:  Normal speech and language. No  gross focal neurologic deficits are appreciated.  Skin: Positive for bruising over the left hip Psychiatric: Mood and affect are normal. Speech and behavior are normal.  ____________________________________________  ED COURSE:  As part of my medical decision making, I reviewed the following data within the [place] History obtained from family if available, nursing notes, old chart and ekg, as well as notes from prior ED visits. Patient presented for altered mental status, we will assess with labs and imaging as indicated at this time.   Procedures  CLEOPHAS VERDERAME was evaluated in Emergency Department on 11/28/2018 for the symptoms described in the history of present illness. He was evaluated in the context of the global COVID-19 pandemic, which necessitated consideration that the patient might be at risk for infection with the SARS-CoV-2 virus that causes COVID-19. Institutional protocols and algorithms that pertain to the evaluation of patients at risk for COVID-19 are in a state of  rapid change based on information released by regulatory bodies including the CDC and federal and state organizations. These policies and algorithms were followed during the patient's care in the ED.  ____________________________________________   LABS (pertinent positives/negatives)  Labs Reviewed  COMPREHENSIVE METABOLIC PANEL - Abnormal; Notable for the following components:      Result Value   Glucose, Bld 141 (*)    BUN 26 (*)    Creatinine, Ser 1.32 (*)    AST 75 (*)    Alkaline Phosphatase 134 (*)    Total Bilirubin 1.5 (*)    GFR calc non Af Amer 50 (*)    GFR calc Af Amer 58 (*)    All other components within normal limits  URINALYSIS, COMPLETE (UACMP) WITH MICROSCOPIC - Abnormal; Notable for the following components:   Color, Urine YELLOW (*)    APPearance CLEAR (*)    Hgb urine dipstick MODERATE (*)    All other components within normal limits  GLUCOSE, CAPILLARY -  Abnormal; Notable for the following components:   Glucose-Capillary 133 (*)    All other components within normal limits  CBC WITH DIFFERENTIAL/PLATELET  CBG MONITORING, ED  TROPONIN I (HIGH SENSITIVITY)    RADIOLOGY Images were viewed by me  CT head, chest x-ray, left hip x-ray IMPRESSION: Atrophy, chronic microvascular disease.  No acute intracranial abnormality. IMPRESSION: No active disease. Hip x-ray was unremarkable ____________________________________________   DIFFERENTIAL DIAGNOSIS   Dementia, dehydration, electrolyte abnormality, sepsis, encephalopathy, CVA, MI  FINAL ASSESSMENT AND PLAN  Confusion   Plan: The patient had presented for altered mental status. Patient's labs did reveal some likely dehydration but otherwise were unremarkable, no urinary tract infection for which she was recently placed on Macrobid. Patient's imaging was reassuring.  I have advised that he will need to follow-up with neurology and/or his primary care doctor for memory testing.  He was given fluids here, is otherwise cleared for outpatient follow-up.   Laurence Aly, MD    Note: This note was generated in part or whole with voice recognition software. Voice recognition is usually quite accurate but there are transcription errors that can and very often do occur. I apologize for any typographical errors that were not detected and corrected.     Earleen Newport, MD 11/28/18 [phone]

## 2018-11-28 NOTE — ED Notes (Signed)
EDP clears patient for discharge without oxygen.

## 2018-11-28 NOTE — ED Triage Notes (Addendum)
Patient's wife brought patient to the ED.  Wife states patient has been intermittently confused since Oct. 2nd.  Patient's wife states he had a fall on the 2nd as well and was very confused that day.  Patient has had 2 more falls since then.  Patient's wife sent a urine specimen to the PCP who stated patient had a very bad UTI, called in [place] for patient and told patient's wife, the UTI looked pretty bad and he may need to come to the ED.  EMS came to the home but patient refused to come with them. Patient's wife states during the night, patient was reaching into the air and talking to himself.  Patient has had decreased urination as well, per patient's wife.  Patient and his wife tested positive for Covid19 on October 5th after being exposed to their daughter who works at [employer].

## 2018-11-28 NOTE — ED Notes (Signed)
Pt alert and oriented X 4, stable for discharge. RR even and unlabored, color WNL. Discussed discharge instructions and follow up when appropriate. Instructed to follow up with ER for any life threatening symptoms or concerns that patient or family of patient may have  

## 2018-11-28 NOTE — ED Notes (Signed)
X-ray at bedside

## 2018-12-09 ENCOUNTER — Other Ambulatory Visit: Payer: Self-pay | Admitting: Pain Medicine

## 2018-12-09 DIAGNOSIS — G8929 Other chronic pain: Secondary | ICD-10-CM

## 2018-12-09 DIAGNOSIS — M5442 Lumbago with sciatica, left side: Secondary | ICD-10-CM

## 2018-12-12 ENCOUNTER — Encounter: Payer: Self-pay | Admitting: Pain Medicine

## 2018-12-12 NOTE — Progress Notes (Signed)
No procedure on last visit

## 2018-12-15 NOTE — Progress Notes (Signed)
Pain Management Virtual Encounter Note - Virtual Visit via Telephone Telehealth (real-time audio visits between healthcare provider and patient).   Patient's Phone No. & Preferred Pharmacy:  [phone] (home); There is no such number on file (mobile).; (Preferred) [phone] [email or web address]  [facility] - [place], [place] [address] Phone: [phone] Fax: [phone]    Pre-screening note:  Our staff contacted Mr. Bence and offered him an "in person", "face-to-face" appointment versus a telephone encounter. He indicated preferring the telephone encounter, at this time.   Reason for Virtual Visit: COVID-19*  Social distancing based on CDC and AMA recommendations.   I contacted [name] on 12/16/2018 via telephone.      I clearly identified myself as Gaspar Cola, MD. I verified that I was speaking with the correct person using two identifiers (Name: RAMERE MANELLA, and date of birth: 16-Oct-1934).  Advanced Informed Consent I sought verbal advanced consent from [name] for virtual visit interactions. I informed Mr. Gambale of possible security and privacy concerns, risks, and limitations associated with providing "not-in-person" medical evaluation and management services. I also informed Mr. Carby of the availability of "in-person" appointments. Finally, I informed him that there would be a charge for the virtual visit and that he could be  personally, fully or partially, financially responsible for it. Mr. Oen expressed understanding and agreed to proceed.   Historic Elements   Mr. WILLMON BATHURST is a 84 y.o. year old, male patient evaluated today after his last encounter by our practice on 12/09/2018. Mr. Salama  has a past medical history of Acute encephalopathy (12/08/2014), Anxiety, ARF (acute renal failure) ([place]) (12/08/2014), Back pain, Benign neoplasm of large bowel, Capsulitis, Chronic back pain,  Coronavirus infection, Depression, Diabetes mellitus without complication ([place]), Dysphagia, Exostosis, Foot drop, left, Frequent falls (02/2018), GERD (gastroesophageal reflux disease), Gout, Hypertension, Hypothyroidism, Insomnia, Low testosterone, Microscopic hematuria (2016), Myocardial infarction ([place]), Pneumonia (12/07/2014), Pressure ulcer (12/09/2014), Sepsis ([place]) (12/08/2014), and Thyroid disease. He also  has a past surgical history that includes Cholecystectomy; Esophagogastroduodenoscopy (egd) with propofol (N/A, 04/24/2017); Back surgery (1995, 1996); Eye surgery (Bilateral, 1983, 1985); Appendectomy; and Spinal cord stimulator insertion (N/A, 03/13/2018). Mr. Prude has a current medication list which includes the following prescription(s): allopurinol, aspirin, atorvastatin, citalopram, diphenhydramine, fluticasone, gabapentin, isosorbide mononitrate, levothyroxine, linaclotide, losartan, mirabegron er, naloxone, ondansetron, oxycodone, oxycodone, oxycodone, testosterone, vitamin b-12, vitamin e, pantoprazole, and tizanidine. He  reports that he has been smoking cigars. He has a 35.00 pack-year smoking history. He has never used smokeless tobacco. He reports current drug use. Drug: Oxycodone. He reports that he does not drink alcohol. Mr. Och is allergic to ibuprofen; sulfa antibiotics; and sulfasalazine.   HPI  Today, he is being contacted for medication management.  The patient indicates doing well with the current medication regimen. No adverse reactions or side effects reported to the medications.   The patient indicates that his spinal cord stimulator does not seem to be working well.  Today he has requested an increase in his pain medicine but I have declined to do this since he is already at an MME of 90 mg/day.  In addition, the last 2 labs for his kidney function have demonstrated his filtration rate to be going down.  In addition to this, his wife has indicated that he has had multiple  falls, approximately 7 on them during October, and he has also experienced episodes of disorientation.  With this in  mind, there is no way that I am going to increase his medications any further.  Today we talked about the option of switching him from gabapentin to Lyrica, but he indicates that his insurance does not cover it and it is too expensive and this is why he does not want to take it.  In view of this, we will leave the gabapentin as is.  He complains of left lower extremity pain going all the way down to the top of his foot and big toe with a persistent "dropfoot" indicating an L5 radiculopathy.  He also has pain on the left hip and a recently implanted spinal cord stimulator does not seem to be working.  He is frustrated because he says that during the trial did work, but afterwards he has not worked the same as during the trial.  The patient also has a history of gout affecting the left big toe, but I do not have any recent blood work indicating whether or not this has been recently evaluated.  The patient indicates having had a nerve conduction test at [facility] (Dr. [name] office) by a person that was sent by [facility] neurology to their office to do this test.  I do not have the results of that and therefore today we will be requesting those although I expect to see evidence of a left L5 chronic radiculopathy.  I will go ahead and bring him in for a left-sided L5 transforaminal epidural steroid injection to see if I can bring some of this pain down.  I am not interested in increasing his opioid dose.  I know we have talked to him in the past about "drug holidays" but I do not remember the last time that he completed one.  Reviewing his PMP, I do not see any downward taper of the opioids that would suggest any drug holiday since 2018.  Pharmacotherapy Assessment  Analgesic: Oxycodone IR 15 mg, 1 tab PO q 6 hrs. (60 mg/day of oxycodone) MME/day:90mg /day.   Monitoring: Pharmacotherapy:  No side-effects or adverse reactions reported. [place] PMP: PDMP reviewed during this encounter.       Compliance: No problems identified. Effectiveness: Clinically acceptable. Plan: Refer to "POC".  UDS:  Summary  Date Value Ref Range Status  11/14/2017 FINAL  Final    Comment:    ==================================================================== TOXASSURE SELECT 13 (MW) ==================================================================== Test                             Result       Flag       Units Drug Present and Declared for Prescription Verification   Oxycodone                      4937         EXPECTED   ng/mg creat   Oxymorphone                    1650         EXPECTED   ng/mg creat   Noroxycodone                   6664         EXPECTED   ng/mg creat   Noroxymorphone                 520          EXPECTED   ng/mg creat  Sources of oxycodone are scheduled prescription medications.    Oxymorphone, noroxycodone, and noroxymorphone are expected    metabolites of oxycodone. Oxymorphone is also available as a    scheduled prescription medication. ==================================================================== Test                      Result    Flag   Units      Ref Range   Creatinine              139              mg/dL      >=20 ==================================================================== Declared Medications:  The flagging and interpretation on this report are based on the  following declared medications.  Unexpected results may arise from  inaccuracies in the declared medications.  **Note: The testing scope of this panel includes these medications:  Oxycodone  **Note: The testing scope of this panel does not include following  reported medications:  Allopurinol  Aspirin (Aspirin 81)  Atorvastatin  Citalopram  Cyanocobalamin  Diphenhydramine  Fluticasone  Gabapentin  Isosorbide  Levothyroxine  Linaclotide  Losartan (Losartan Potassium)  Mirabegron   Naloxone  Omeprazole  Ondansetron  Testosterone  Vitamin E ==================================================================== For clinical consultation, please call [phone]. ====================================================================    Laboratory Chemistry Profile (12 mo)  Renal: 11/28/2018: BUN 26; Creatinine, Ser 1.32  Lab Results  Component Value Date   GFRAA 58 (L) 11/28/2018   GFRNONAA 50 (L) 11/28/2018   Hepatic: 11/28/2018: Albumin 3.9 Lab Results  Component Value Date   AST 75 (H) 11/28/2018   ALT 20 11/28/2018   Other: 07/24/2018: Vitamin B-12 981 Note: Above Lab results reviewed.  Imaging  Last 90 days:  Dg Chest 1 View  Result Date: 11/28/2018 CLINICAL DATA:  Altered mental status. EXAM: CHEST  1 VIEW COMPARISON:  January 25, 2017. FINDINGS: The heart size and mediastinal contours are within normal limits. Both lungs are clear. No pneumothorax or pleural effusion is noted. The visualized skeletal structures are unremarkable. IMPRESSION: No active disease. Electronically Signed   By: Marijo Conception M.D.   On: 11/28/2018 09:51   Ct Head Wo Contrast  Result Date: 11/28/2018 CLINICAL DATA:  Altered level of consciousness EXAM: CT HEAD WITHOUT CONTRAST TECHNIQUE: Contiguous axial images were obtained from the base of the skull through the vertex without intravenous contrast. COMPARISON:  01/25/2017 FINDINGS: Brain: There is atrophy and chronic small vessel disease changes. No acute intracranial abnormality. Specifically, no hemorrhage, hydrocephalus, mass lesion, acute infarction, or significant intracranial injury. Vascular: No hyperdense vessel or unexpected calcification. Skull: No acute calvarial abnormality. Sinuses/Orbits: Visualized paranasal sinuses and mastoids clear. Orbital soft tissues unremarkable. Other: None IMPRESSION: Atrophy, chronic microvascular disease. No acute intracranial abnormality. Electronically Signed   By: Rolm Baptise M.D.    On: 11/28/2018 09:59   Dg Hip Unilat W Or Wo Pelvis 2-3 Views Left  Result Date: 11/28/2018 CLINICAL DATA:  Left hip pain following fall, initial encounter EXAM: DG HIP (WITH OR WITHOUT PELVIS) 3V LEFT COMPARISON:  None. FINDINGS: No acute fracture or dislocation is noted. Pelvic ring is intact. Degenerative changes of the lumbar spine are seen. IMPRESSION: No acute abnormality noted. Electronically Signed   By: Inez Catalina M.D.   On: 11/28/2018 12:39    Assessment  The primary encounter diagnosis was Chronic pain syndrome. 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  Plan of Care  I am having Evert Kohl. Stann Mainland "Ronalee Belts" start on oxyCODONE and oxyCODONE. I am also having him maintain his allopurinol, levothyroxine, atorvastatin, ondansetron, fluticasone, citalopram, linaclotide, vitamin E, losartan, mirabegron ER, diphenhydrAMINE, naloxone, pantoprazole, isosorbide mononitrate, vitamin B-12, Testosterone, aspirin, oxyCODONE, tiZANidine, and gabapentin.  Pharmacotherapy (Medications Ordered): Meds ordered this encounter  Medications  . oxyCODONE (ROXICODONE) 15 MG immediate release tablet    Sig: Take 1 tablet (15 mg total) by mouth every 6 (six) hours as needed for pain. Must last 30 days    Dispense:  120 tablet    Refill:  0    Chronic Pain: STOP Act (Not applicable) Fill 1 day early if closed on refill date. Do not fill until: 12/18/2018. To last until: 01/17/2019. Avoid benzodiazepines within 8 hours of  opioids  . oxyCODONE (ROXICODONE) 15 MG immediate release tablet    Sig: Take 1 tablet (15 mg total) by mouth every 6 (six) hours as needed for pain. Must last 30 days    Dispense:  120 tablet    Refill:  0    Chronic Pain: STOP Act (Not applicable) Fill 1 day early if closed on refill date. Do not fill until: 01/17/2019. To last until: 02/16/2019. Avoid benzodiazepines within 8 hours of opioids  . oxyCODONE (ROXICODONE) 15 MG immediate release tablet    Sig: Take 1 tablet (15 mg total) by mouth every 6 (six) hours as needed for pain. Must last 30 days    Dispense:  120 tablet    Refill:  0    Chronic Pain: STOP Act (Not applicable) Fill 1 day early if closed on refill date. Do not fill until: 02/16/2019. To last until: 03/18/2019. Avoid benzodiazepines within 8 hours of opioids  . tiZANidine (ZANAFLEX) 4 MG capsule    Sig: Take 1 capsule (4 mg total) by mouth 3 (three) times daily.    Dispense:  90 capsule    Refill:  5    Fill one day early if pharmacy is closed on scheduled refill date. May substitute for generic if available.  . gabapentin (NEURONTIN) 600 MG tablet    Sig: Take 1 tablet (600 mg total) by mouth every 6 (six) hours.    Dispense:  120 tablet    Refill:  5    Fill one day early if pharmacy is closed on scheduled refill date. May substitute for generic if available.   Orders:  Orders Placed This Encounter  Procedures  . Lumbar Trans-Foraminal (Schedule)    Standing Status:   Future    Standing Expiration Date:   01/15/2019    Scheduling Instructions:     Side: Left-sided     Level: L5     Sedation: Patient's choice.     Timeframe: ASAP    Order Specific Question:   Where will this procedure be performed?    Answer:   [facility]  . DG Lumbar Spine Complete w/ Flex/Ext (6 Views)    In addition to any acute findings, please report on degenerative changes related to: (Please specify level(s)) (1) ROM & instability (>14mm displacement) (2) Facet joint  (Zygoapophyseal Joint) (3) DDD and/or IVDD (4) Pars  defects (5) Previous surgical changes (Include description of hardware and hardware status, if present) (6) Presence and degree of spondylolisthesis, spondylosis, and/or spondyloarthropathies)  (7) Old Fractures (8) Demineralization (9) Additional bone pathology (10) Stenosis (Central, Lateral Recess, Foraminal) (11) If at all possible, please provide AP diameter (mm) of foraminal and/or central canal.    Standing Status:   Future    Standing Expiration Date:   03/18/2019    Order Specific Question:   Reason for Exam (SYMPTOM  OR DIAGNOSIS REQUIRED)    Answer:   Low back pain    Order Specific Question:   Preferred imaging location?    Answer:   [facility]    Order Specific Question:   Call Results- Best Contact Number?    Answer:   [phone] ([facility])    Order Specific Question:   Radiology Contrast Protocol - do NOT remove file path    Answer:   [email or web address]  . Uric acid, random urine  . Uric acid, blood    Order Specific Question:   CC Results    Answer:   PCP-NURSE [id number]   Follow-up plan:   Return in about 13 weeks (around 03/17/2019) for (VV), (MM), in addition, Procedure (w/ sedation): (L) L5 TFESI + [employer] SCS reprogramming.      Interventional management options:  Planned, scheduled, and/or pending:      Considering:   Diagnostic Caudal ESI + epidurogram #1 Possible Racz procedure Diagnostic bilateral lumbar facet block Possible bilateral lumbar facet RFA Possible candidate for intrathecal pump trial and implant   Palliative PRN treatment(s):   Therapeutic/palliative left L4 & L5TFESI#2 Bilateral spinal cord stimulator trial (01/17/2018)  Permanent bilateral spinal cord stimulator implant by Dr. Cari Caraway (neurosurgery) (03/13/2018)    Recent Visits Date Type Provider Dept  10/22/18 Procedure visit Milinda Pointer, MD [facility]  Showing recent visits within past 90 days and meeting all other requirements   Today's Visits Date Type Provider Dept  12/16/18 Telemedicine Milinda Pointer, MD [facility]  Showing today's visits and meeting all other requirements   Future Appointments No visits were found meeting these conditions.  Showing future appointments within next 90 days and meeting all other requirements   I discussed the assessment and treatment plan with the patient. The patient was provided an opportunity to ask questions and all were answered. The patient agreed with the plan and demonstrated an understanding of the instructions.  Patient advised to call back or seek an in-person evaluation if the symptoms or condition worsens.  Total duration of non-face-to-face encounter: 30 minutes.  Note by: Gaspar Cola, MD Date: 12/16/2018; Time: 2:49 PM  Note: This dictation was prepared with Dragon dictation. Any transcriptional errors that may result from this process are unintentional.  Disclaimer:  * Given the special circumstances of the COVID-19 pandemic, the federal government has announced that the Office for Civil Rights (OCR) will exercise its enforcement discretion and will not impose penalties on physicians using telehealth in the event of noncompliance with regulatory requirements under the [place] (HIPAA) in connection with the good faith provision of telehealth during the XX123456 national public health emergency. ([place])

## 2018-12-16 ENCOUNTER — Other Ambulatory Visit: Payer: Self-pay

## 2018-12-16 ENCOUNTER — Ambulatory Visit: Payer: Medicare Other | Attending: Pain Medicine | Admitting: Pain Medicine

## 2018-12-16 DIAGNOSIS — G894 Chronic pain syndrome: Secondary | ICD-10-CM | POA: Diagnosis not present

## 2018-12-16 DIAGNOSIS — T85192A Other mechanical complication of implanted electronic neurostimulator (electrode) of spinal cord, initial encounter: Secondary | ICD-10-CM

## 2018-12-16 DIAGNOSIS — M961 Postlaminectomy syndrome, not elsewhere classified: Secondary | ICD-10-CM

## 2018-12-16 DIAGNOSIS — M10372 Gout due to renal impairment, left ankle and foot: Secondary | ICD-10-CM | POA: Insufficient documentation

## 2018-12-16 DIAGNOSIS — M5442 Lumbago with sciatica, left side: Secondary | ICD-10-CM | POA: Diagnosis not present

## 2018-12-16 DIAGNOSIS — M7918 Myalgia, other site: Secondary | ICD-10-CM

## 2018-12-16 DIAGNOSIS — M1A3720 Chronic gout due to renal impairment, left ankle and foot, without tophus (tophi): Secondary | ICD-10-CM

## 2018-12-16 DIAGNOSIS — M792 Neuralgia and neuritis, unspecified: Secondary | ICD-10-CM

## 2018-12-16 DIAGNOSIS — M5417 Radiculopathy, lumbosacral region: Secondary | ICD-10-CM

## 2018-12-16 DIAGNOSIS — Z9682 Presence of neurostimulator: Secondary | ICD-10-CM

## 2018-12-16 DIAGNOSIS — M21372 Foot drop, left foot: Secondary | ICD-10-CM

## 2018-12-16 DIAGNOSIS — M79605 Pain in left leg: Secondary | ICD-10-CM

## 2018-12-16 DIAGNOSIS — G8929 Other chronic pain: Secondary | ICD-10-CM

## 2018-12-16 MED ORDER — OXYCODONE HCL 15 MG PO TABS: 15.0000 mg | ORAL_TABLET | Freq: Four times a day (QID) | ORAL | 0 refills | Status: DC | PRN

## 2018-12-16 MED ORDER — TIZANIDINE HCL 4 MG PO CAPS: 4.0000 mg | ORAL_CAPSULE | Freq: Three times a day (TID) | ORAL | 5 refills | Status: DC

## 2018-12-16 MED ORDER — GABAPENTIN 600 MG PO TABS: 600.0000 mg | ORAL_TABLET | Freq: Four times a day (QID) | ORAL | 5 refills | Status: DC

## 2018-12-19 ENCOUNTER — Ambulatory Visit
Admission: RE | Admit: 2018-12-19 | Discharge: 2018-12-19 | Disposition: A | Discharge status: Home / Self Care | Payer: PRIVATE HEALTH INSURANCE | Source: Ambulatory Visit | Attending: Pain Medicine | Admitting: Pain Medicine

## 2018-12-19 ENCOUNTER — Other Ambulatory Visit: Payer: Self-pay

## 2018-12-19 DIAGNOSIS — M5417 Radiculopathy, lumbosacral region: Secondary | ICD-10-CM | POA: Diagnosis present

## 2018-12-19 DIAGNOSIS — M21372 Foot drop, left foot: Secondary | ICD-10-CM | POA: Diagnosis present

## 2018-12-19 DIAGNOSIS — T85192A Other mechanical complication of implanted electronic neurostimulator (electrode) of spinal cord, initial encounter: Secondary | ICD-10-CM | POA: Diagnosis present

## 2018-12-19 DIAGNOSIS — M961 Postlaminectomy syndrome, not elsewhere classified: Secondary | ICD-10-CM | POA: Diagnosis present

## 2018-12-19 DIAGNOSIS — M5442 Lumbago with sciatica, left side: Secondary | ICD-10-CM | POA: Diagnosis present

## 2018-12-19 DIAGNOSIS — G8929 Other chronic pain: Secondary | ICD-10-CM | POA: Insufficient documentation

## 2018-12-19 DIAGNOSIS — M79605 Pain in left leg: Secondary | ICD-10-CM | POA: Diagnosis not present

## 2018-12-19 DIAGNOSIS — Z9682 Presence of neurostimulator: Secondary | ICD-10-CM

## 2018-12-20 LAB — URIC ACID, RANDOM URINE: Uric Acid, Ur: 41.5 mg/dL

## 2018-12-20 LAB — URIC ACID: Uric Acid: 4.9 mg/dL (ref 3.7–8.6)

## 2018-12-24 ENCOUNTER — Ambulatory Visit
Admission: RE | Admit: 2018-12-24 | Discharge: 2018-12-24 | Disposition: A | Discharge status: Home / Self Care | Payer: PRIVATE HEALTH INSURANCE | Source: Ambulatory Visit | Attending: Pain Medicine | Admitting: Pain Medicine

## 2018-12-24 ENCOUNTER — Encounter: Payer: Self-pay | Admitting: Pain Medicine

## 2018-12-24 ENCOUNTER — Ambulatory Visit (HOSPITAL_BASED_OUTPATIENT_CLINIC_OR_DEPARTMENT_OTHER): Payer: PRIVATE HEALTH INSURANCE | Admitting: Pain Medicine

## 2018-12-24 ENCOUNTER — Other Ambulatory Visit: Payer: Self-pay

## 2018-12-24 VITALS — BP 130/70 | HR 63 | Temp 97.2°F | Resp 18 | Ht 72.0 in | Wt 203.0 lb

## 2018-12-24 DIAGNOSIS — M21372 Foot drop, left foot: Secondary | ICD-10-CM

## 2018-12-24 DIAGNOSIS — M5417 Radiculopathy, lumbosacral region: Secondary | ICD-10-CM | POA: Diagnosis present

## 2018-12-24 DIAGNOSIS — M961 Postlaminectomy syndrome, not elsewhere classified: Secondary | ICD-10-CM | POA: Insufficient documentation

## 2018-12-24 DIAGNOSIS — M5136 Other intervertebral disc degeneration, lumbar region: Secondary | ICD-10-CM

## 2018-12-24 DIAGNOSIS — G8929 Other chronic pain: Secondary | ICD-10-CM | POA: Insufficient documentation

## 2018-12-24 DIAGNOSIS — M5442 Lumbago with sciatica, left side: Secondary | ICD-10-CM | POA: Diagnosis present

## 2018-12-24 DIAGNOSIS — M79605 Pain in left leg: Secondary | ICD-10-CM | POA: Diagnosis present

## 2018-12-24 DIAGNOSIS — M51369 Other intervertebral disc degeneration, lumbar region without mention of lumbar back pain or lower extremity pain: Secondary | ICD-10-CM

## 2018-12-24 MED ORDER — SODIUM CHLORIDE 0.9% FLUSH
1.0000 mL | Freq: Once | INTRAVENOUS | Status: AC
  Administered 2018-12-24: 10:00:00 1 mL

## 2018-12-24 MED ORDER — LACTATED RINGERS IV SOLN
1000.0000 mL | Freq: Once | INTRAVENOUS | Status: AC
  Administered 2018-12-24: 10:00:00 1000 mL via INTRAVENOUS

## 2018-12-24 MED ORDER — ROPIVACAINE HCL 2 MG/ML IJ SOLN
1.0000 mL | Freq: Once | INTRAMUSCULAR | Status: AC
  Administered 2018-12-24: 1 mL via EPIDURAL
  Filled 2018-12-24: qty 10

## 2018-12-24 MED ORDER — MIDAZOLAM HCL 5 MG/5ML IJ SOLN
1.0000 mg | INTRAMUSCULAR | Status: DC | PRN
  Administered 2018-12-24: 10:00:00 2 mg via INTRAVENOUS
  Filled 2018-12-24: qty 5

## 2018-12-24 MED ORDER — FENTANYL CITRATE (PF) 100 MCG/2ML IJ SOLN
25.0000 ug | INTRAMUSCULAR | Status: DC | PRN
  Administered 2018-12-24: 10:00:00 50 ug via INTRAVENOUS
  Filled 2018-12-24: qty 2

## 2018-12-24 MED ORDER — LIDOCAINE HCL 2 % IJ SOLN
20.0000 mL | Freq: Once | INTRAMUSCULAR | Status: AC
  Administered 2018-12-24: 10:00:00 400 mg
  Filled 2018-12-24: qty 40

## 2018-12-24 MED ORDER — IOHEXOL 180 MG/ML  SOLN
10.0000 mL | Freq: Once | INTRAMUSCULAR | Status: AC
  Administered 2018-12-24: 1 mL via EPIDURAL
  Filled 2018-12-24: qty 20

## 2018-12-24 MED ORDER — DEXAMETHASONE SODIUM PHOSPHATE 10 MG/ML IJ SOLN
10.0000 mg | Freq: Once | INTRAMUSCULAR | Status: AC
  Administered 2018-12-24: 10:00:00 10 mg
  Filled 2018-12-24: qty 1

## 2018-12-24 NOTE — Progress Notes (Signed)
Patient's Name: Chase Mendez  MRN: [id number]  Referring Provider: Jodi Marble, MD  DOB: 04-04-1934  PCP: Jodi Marble, MD  DOS: 12/24/2018  Note by: Gaspar Cola, MD  Service setting: Ambulatory outpatient  Specialty: Interventional Pain Management  Patient type: Established  Location: [facility] (AMB) Pain Management Facility  Visit type: Interventional Procedure   Primary Reason for Visit: Interventional Pain Management Treatment. CC: Leg Pain (left)  Procedure:          Anesthesia, Analgesia, Anxiolysis:  Type: Trans-Foraminal Epidural Steroid Injection #2  Purpose: Diagnostic/Therapeutic Region: Posterolateral Lumbosacral Target Area: The 6 o'clock position under the pedicle, on the affected side. Approach: Posterior Percutaneous Paravertebral approach. Level: L5 Level Laterality: Left-Sided Paravertebral  Type: Moderate (Conscious) Sedation combined with Local Anesthesia Indication(s): Analgesia and Anxiety Route: Intravenous (IV) IV Access: Secured Sedation: Meaningful verbal contact was maintained at all times during the procedure  Local Anesthetic: Lidocaine 1-2%  Position: Prone   Indications: 1. Failed back surgical syndrome   2. Chronic lower extremity pain (Primary Area of Pain) (Left)   3. Chronic low back pain (Secondary Area of Pain) (Bilateral) (ML) (L>R) with sciatica (Left)   4. DDD (degenerative disc disease), lumbar   5. Foot drop (Left)   6. Lower limb pain, inferior (L5) (Foot Drop) (Left)   7. Lumbosacral radiculopathy at L5 (Left)    Pain Score: Pre-procedure: 5 /10 Post-procedure: 0-No pain/10   Note: The patient came into the clinics yesterday to have his spinal cord stimulator reprogrammed by one of the [employer] representatives.  The patient indicates that it seems to be working much better now.  Today we will take some fluoroscopic views of the implant to document its location and to make sure that there has been no migration since  the implant was put in.   This is an image of the final position on his trial lead. (01/17/18)  The following are images taken today of his permanent implant. Area covered seems to be fairly similar.         Pre-op Assessment:  Chase Mendez is a 84 y.o. (year old), male patient, seen today for interventional treatment. He  has a past surgical history that includes Cholecystectomy; Esophagogastroduodenoscopy (egd) with propofol (N/A, 04/24/2017); Back surgery (1995, 1996); Eye surgery (Bilateral, 1983, 1985); Appendectomy; and Spinal cord stimulator insertion (N/A, 03/13/2018). Chase Mendez has a current medication list which includes the following prescription(s): allopurinol, aspirin, atorvastatin, citalopram, diphenhydramine, fluticasone, gabapentin, isosorbide mononitrate, levothyroxine, linaclotide, losartan, mirabegron er, naloxone, ondansetron, oxycodone, oxycodone, oxycodone, testosterone, tizanidine, vitamin b-12, vitamin e, and pantoprazole, and the following Facility-Administered Medications: fentanyl and midazolam. His primarily concern today is the Leg Pain (left)  Initial Vital Signs:  Pulse/HCG Rate: 63ECG Heart Rate: 71 Temp: (!) 97.5 F (36.4 C) Resp: 18 BP: (!) 142/73 SpO2: 98 %  BMI: Estimated body mass index is 27.53 kg/m as calculated from the following:   Height as of this encounter: 6' (1.829 m).   Weight as of this encounter: 203 lb (92.1 kg).  Risk Assessment: Allergies: Reviewed. He is allergic to ibuprofen; sulfa antibiotics; and sulfasalazine.  Allergy Precautions: None required Coagulopathies: Reviewed. None identified.  Blood-thinner therapy: None at this time Active Infection(s): Reviewed. None identified. Chase Mendez is afebrile  Site Confirmation: Chase Mendez was asked to confirm the procedure and laterality before marking the site Procedure checklist: Completed Consent: Before the procedure and under the influence of no sedative(s), amnesic(s), or  anxiolytics, the patient was informed of  the treatment options, risks and possible complications. To fulfill our ethical and legal obligations, as recommended by the American Medical Association's Code of Ethics, I have informed the patient of my clinical impression; the nature and purpose of the treatment or procedure; the risks, benefits, and possible complications of the intervention; the alternatives, including doing nothing; the risk(s) and benefit(s) of the alternative treatment(s) or procedure(s); and the risk(s) and benefit(s) of doing nothing. The patient was provided information about the general risks and possible complications associated with the procedure. These may include, but are not limited to: failure to achieve desired goals, infection, bleeding, organ or nerve damage, allergic reactions, paralysis, and death. In addition, the patient was informed of those risks and complications associated to Spine-related procedures, such as failure to decrease pain; infection (i.e.: Meningitis, epidural or intraspinal abscess); bleeding (i.e.: epidural hematoma, subarachnoid hemorrhage, or any other type of intraspinal or peri-dural bleeding); organ or nerve damage (i.e.: Any type of peripheral nerve, nerve root, or spinal cord injury) with subsequent damage to sensory, motor, and/or autonomic systems, resulting in permanent pain, numbness, and/or weakness of one or several areas of the body; allergic reactions; (i.e.: anaphylactic reaction); and/or death. Furthermore, the patient was informed of those risks and complications associated with the medications. These include, but are not limited to: allergic reactions (i.e.: anaphylactic or anaphylactoid reaction(s)); adrenal axis suppression; blood sugar elevation that in diabetics may result in ketoacidosis or comma; water retention that in patients with history of congestive heart failure may result in shortness of breath, pulmonary edema, and decompensation  with resultant heart failure; weight gain; swelling or edema; medication-induced neural toxicity; particulate matter embolism and blood vessel occlusion with resultant organ, and/or nervous system infarction; and/or aseptic necrosis of one or more joints. Finally, the patient was informed that Medicine is not an exact science; therefore, there is also the possibility of unforeseen or unpredictable risks and/or possible complications that may result in a catastrophic outcome. The patient indicated having understood very clearly. We have given the patient no guarantees and we have made no promises. Enough time was given to the patient to ask questions, all of which were answered to the patient's satisfaction. Mr. Mifsud has indicated that he wanted to continue with the procedure. Attestation: I, the ordering provider, attest that I have discussed with the patient the benefits, risks, side-effects, alternatives, likelihood of achieving goals, and potential problems during recovery for the procedure that I have provided informed consent. Date  Time: 12/24/2018  9:22 AM  Pre-Procedure Preparation:  Monitoring: As per clinic protocol. Respiration, ETCO2, SpO2, BP, heart rate and rhythm monitor placed and checked for adequate function Safety Precautions: Patient was assessed for positional comfort and pressure points before starting the procedure. Time-out: I initiated and conducted the "Time-out" before starting the procedure, as per protocol. The patient was asked to participate by confirming the accuracy of the "Time Out" information. Verification of the correct person, site, and procedure were performed and confirmed by me, the nursing staff, and the patient. "Time-out" conducted as per Joint Commission's Universal Protocol (UP.01.01.01). Time: 1004  Description of Procedure:          Area Prepped: Entire Posterior Lumbosacral Area Prepping solution: DuraPrep (Iodine Povacrylex [0.7% available iodine] and  Isopropyl Alcohol, 74% w/w) Safety Precautions: Aspiration looking for blood return was conducted prior to all injections. At no point did we inject any substances, as a needle was being advanced. No attempts were made at seeking any paresthesias. Safe injection practices  and needle disposal techniques used. Medications properly checked for expiration dates. SDV (single dose vial) medications used. Description of the Procedure: Protocol guidelines were followed. The patient was placed in position over the procedure table. The target area was identified and the area prepped in the usual manner. Skin & deeper tissues infiltrated with local anesthetic. Appropriate amount of time allowed to pass for local anesthetics to take effect. The procedure needles were then advanced to the target area. Proper needle placement secured. Negative aspiration confirmed. Solution injected in intermittent fashion, asking for systemic symptoms every 0.5cc of injectate. The needles were then removed and the area cleansed, making sure to leave some of the prepping solution back to take advantage of its long term bactericidal properties.  Vitals:   12/24/18 1013 12/24/18 1024 12/24/18 1033 12/24/18 1043  BP: 130/83 (!) 147/74 132/73 130/70  Pulse:      Resp: 18 18 18 18   Temp:  (!) 97.4 F (36.3 C)  (!) 97.2 F (36.2 C)  SpO2: 98% 96% 97% 97%  Weight:      Height:        Start Time: 1004 hrs. End Time: 1013 hrs.  Materials:  Needle(s) Type: Spinal Needle Gauge: 22G Length: 3.5-in Medication(s): Please see orders for medications and dosing details.  Imaging Guidance (Spinal):          Type of Imaging Technique: Fluoroscopy Guidance (Spinal) Indication(s): Assistance in needle guidance and placement for procedures requiring needle placement in or near specific anatomical locations not easily accessible without such assistance. Exposure Time: Please see nurses notes. Contrast: Before injecting any contrast, we  confirmed that the patient did not have an allergy to iodine, shellfish, or radiological contrast. Once satisfactory needle placement was completed at the desired level, radiological contrast was injected. Contrast injected under live fluoroscopy. No contrast complications. See chart for type and volume of contrast used. Fluoroscopic Guidance: I was personally present during the use of fluoroscopy. "Tunnel Vision Technique" used to obtain the best possible view of the target area. Parallax error corrected before commencing the procedure. "Direction-depth-direction" technique used to introduce the needle under continuous pulsed fluoroscopy. Once target was reached, antero-posterior, oblique, and lateral fluoroscopic projection used confirm needle placement in all planes. Images permanently stored in EMR. Interpretation: I personally interpreted the imaging intraoperatively. Adequate needle placement confirmed in multiple planes. Appropriate spread of contrast into desired area was observed. No evidence of afferent or efferent intravascular uptake. No intrathecal or subarachnoid spread observed. Permanent images saved into the patient's record.  Antibiotic Prophylaxis:   Anti-infectives (From admission, onward)   None     Indication(s): None identified  Post-operative Assessment:  Post-procedure Vital Signs:  Pulse/HCG Rate: 6367 Temp: (!) 97.2 F (36.2 C) Resp: 18 BP: 130/70 SpO2: 97 %  EBL: None  Complications: No immediate post-treatment complications observed by team, or reported by patient.  Note: The patient tolerated the entire procedure well. A repeat set of vitals were taken after the procedure and the patient was kept under observation following institutional policy, for this type of procedure. Post-procedural neurological assessment was performed, showing return to baseline, prior to discharge. The patient was provided with post-procedure discharge instructions, including a section  on how to identify potential problems. Should any problems arise concerning this procedure, the patient was given instructions to immediately contact us, at any time, without hesitation. In any case, we plan to contact the patient by telephone for a follow-up status report regarding this interventional procedure.  Comments:  No additional relevant information.  Plan of Care  Orders:  Orders Placed This Encounter  Procedures  . Lumbar Trans-Foraminal (Today)    Scheduling Instructions:     Side: Left-sided     Level: L5     Sedation: With Sedation.     Timeframe: Today    Order Specific Question:   Where will this procedure be performed?    Answer:   [facility]  . Fluoro (C-Arm) (<60 min) (No Report)    Intraoperative interpretation by procedural physician at [facility].    Standing Status:   Standing    Number of Occurrences:   1    Order Specific Question:   Reason for exam:    Answer:   Assistance in needle guidance and placement for procedures requiring needle placement in or near specific anatomical locations not easily accessible without such assistance.  . Consent: L-TFESI    Provider Attestation: I, Lane Dossie Arbour, MD, (Pain Management Specialist), the physician/practitioner, attest that I have discussed with the patient the benefits, risks, side effects, alternatives, likelihood of achieving goals and potential problems during recovery for the procedure that I have provided informed consent.    Scheduling Instructions:     Procedure: Diagnostic lumbar transforaminal epidural steroid injection under fluoroscopic guidance. (See notes for level and laterality.)     Indication/Reason: Lumbar radiculopathy/radiculitis associated with lumbar stenosis     Note: Always confirm laterality of pain with Mr. Weisel, before procedure.     Transcribe to consent form and obtain patient signature.  . Block Tray    Equipment required: Single use, disposable, "Block  Tray"    Standing Status:   Standing    Number of Occurrences:   1    Order Specific Question:   Specify    Answer:   Block Tray   Chronic Opioid Analgesic:  Oxycodone IR 15 mg, 1 tab PO q 6 hrs. (60 mg/day of oxycodone) MME/day:90mg /day.   Medications ordered for procedure: Meds ordered this encounter  Medications  . iohexol (OMNIPAQUE) 180 MG/ML injection 10 mL    Must be Myelogram-compatible. If not available, you may substitute with a water-soluble, non-ionic, hypoallergenic, myelogram-compatible radiological contrast medium.  [name] lidocaine (XYLOCAINE) 2 % (with pres) injection 400 mg  . lactated ringers infusion 1,000 mL  . midazolam (VERSED) 5 MG/5ML injection 1-2 mg    Make sure Flumazenil is available in the pyxis when using this medication. If oversedation occurs, administer 0.2 mg IV over 15 sec. If after 45 sec no response, administer 0.2 mg again over 1 min; may repeat at 1 min intervals; not to exceed 4 doses (1 mg)  . fentaNYL (SUBLIMAZE) injection 25-50 mcg    Make sure Narcan is available in the pyxis when using this medication. In the event of respiratory depression (RR< 8/min): Titrate NARCAN (naloxone) in increments of 0.1 to 0.2 mg IV at 2-3 minute intervals, until desired degree of reversal.  . sodium chloride flush (NS) 0.9 % injection 1 mL  . ropivacaine (PF) 2 mg/mL (0.2%) (NAROPIN) injection 1 mL  . dexamethasone (DECADRON) injection 10 mg   Medications administered: We administered iohexol, lidocaine, lactated ringers, midazolam, fentaNYL, sodium chloride flush, ropivacaine (PF) 2 mg/mL (0.2%), and dexamethasone.  See the medical record for exact dosing, route, and time of administration.  Follow-up plan:   Return in about 2 weeks (around 01/07/2019) for (VV), (PP).      Interventional treatment options:  Under consideration:   Diagnostic Caudal  ESI + epidurogram #1 Possible Racz procedure Diagnostic bilateral lumbar facet block Possible bilateral  lumbar facet RFA Possible candidate for intrathecal pump trial and implant   Therapeutic/palliative (PRN):   Therapeutic/palliative left L4TFESI#2 Therapeutic/palliative left L5TFESI#2 Bilateral spinal cord stimulator trial (done - 01/17/2018)  Permanent bilateral spinal cord stimulator implant by Dr. Cari Caraway (neurosurgery) (done - 03/13/2018)    Recent Visits Date Type Provider Dept  12/16/18 Telemedicine Milinda Pointer, [facility]  10/22/18 Procedure visit Milinda Pointer, MD [facility]  Showing recent visits within past 90 days and meeting all other requirements   Today's Visits Date Type Provider Dept  12/24/18 Procedure visit Milinda Pointer, MD [facility]  Showing today's visits and meeting all other requirements   Future Appointments Date Type Provider Dept  01/13/19 Appointment Milinda Pointer, MD [facility]  03/17/19 Appointment Milinda Pointer, MD [facility]  Showing future appointments within next 90 days and meeting all other requirements   Disposition: Discharge home  Discharge Date & Time: 12/24/2018; 1046 hrs.   Primary Care Physician: Jodi Marble, MD Location: [facility] Note by: Gaspar Cola, MD Date: 12/24/2018; Time: 11:19 AM  Disclaimer:  Medicine is not an Chief Strategy Officer. The only guarantee in medicine is that nothing is guaranteed. It is important to note that the decision to proceed with this intervention was based on the information collected from the patient. The Data and conclusions were drawn from the patient's questionnaire, the interview, and the physical examination. Because the information was provided in large part by the patient, it cannot be guaranteed that it has not been purposely or unconsciously manipulated. Every effort has been made to obtain as much relevant data as possible for this evaluation. It is important to  note that the conclusions that lead to this procedure are derived in large part from the available data. Always take into account that the treatment will also be dependent on availability of resources and existing treatment guidelines, considered by other Pain Management Practitioners as being common knowledge and practice, at the time of the intervention. For Medico-Legal purposes, it is also important to point out that variation in procedural techniques and pharmacological choices are the acceptable norm. The indications, contraindications, technique, and results of the above procedure should only be interpreted and judged by a Board-Certified Interventional Pain Specialist with extensive familiarity and expertise in the same exact procedure and technique.

## 2018-12-24 NOTE — Patient Instructions (Signed)

## 2018-12-24 NOTE — Progress Notes (Signed)
Safety precautions to be maintained throughout the outpatient stay will include: orient to surroundings, keep bed in low position, maintain call bell within reach at all times, provide assistance with transfer out of bed and ambulation.  

## 2018-12-25 ENCOUNTER — Telehealth: Payer: Self-pay

## 2018-12-25 NOTE — Telephone Encounter (Signed)
Post procedure phone call. Patient states he is doing well.  

## 2018-12-30 MED FILL — Sodium Chloride Preservative Free (PF) Inj 0.9%: INTRAMUSCULAR | Qty: 10 | Status: AC

## 2019-01-01 ENCOUNTER — Encounter: Payer: Self-pay | Admitting: Emergency Medicine

## 2019-01-01 ENCOUNTER — Emergency Department
Admission: EM | Admit: 2019-01-01 | Discharge: 2019-01-01 | Disposition: A | Discharge status: Home / Self Care | Payer: Medicare Other | Attending: Emergency Medicine | Admitting: Emergency Medicine

## 2019-01-01 ENCOUNTER — Emergency Department: Payer: Medicare Other

## 2019-01-01 ENCOUNTER — Other Ambulatory Visit: Payer: Self-pay

## 2019-01-01 DIAGNOSIS — Z79899 Other long term (current) drug therapy: Secondary | ICD-10-CM | POA: Insufficient documentation

## 2019-01-01 DIAGNOSIS — N179 Acute kidney failure, unspecified: Secondary | ICD-10-CM | POA: Insufficient documentation

## 2019-01-01 DIAGNOSIS — I10 Essential (primary) hypertension: Secondary | ICD-10-CM | POA: Diagnosis not present

## 2019-01-01 DIAGNOSIS — Y939 Activity, unspecified: Secondary | ICD-10-CM | POA: Insufficient documentation

## 2019-01-01 DIAGNOSIS — F17210 Nicotine dependence, cigarettes, uncomplicated: Secondary | ICD-10-CM | POA: Diagnosis not present

## 2019-01-01 DIAGNOSIS — Z7982 Long term (current) use of aspirin: Secondary | ICD-10-CM | POA: Diagnosis not present

## 2019-01-01 DIAGNOSIS — E860 Dehydration: Secondary | ICD-10-CM | POA: Insufficient documentation

## 2019-01-01 DIAGNOSIS — W19XXXA Unspecified fall, initial encounter: Secondary | ICD-10-CM | POA: Insufficient documentation

## 2019-01-01 DIAGNOSIS — Y999 Unspecified external cause status: Secondary | ICD-10-CM | POA: Insufficient documentation

## 2019-01-01 DIAGNOSIS — Y929 Unspecified place or not applicable: Secondary | ICD-10-CM | POA: Diagnosis not present

## 2019-01-01 DIAGNOSIS — E039 Hypothyroidism, unspecified: Secondary | ICD-10-CM | POA: Insufficient documentation

## 2019-01-01 DIAGNOSIS — R42 Dizziness and giddiness: Secondary | ICD-10-CM | POA: Diagnosis not present

## 2019-01-01 DIAGNOSIS — S0990XA Unspecified injury of head, initial encounter: Secondary | ICD-10-CM

## 2019-01-01 LAB — CBC WITH DIFFERENTIAL/PLATELET
Abs Immature Granulocytes: 0.06 10*3/uL (ref 0.00–0.07)
Basophils Absolute: 0 10*3/uL (ref 0.0–0.1)
Basophils Relative: 1 %
Eosinophils Absolute: 0.4 10*3/uL (ref 0.0–0.5)
Eosinophils Relative: 6 %
HCT: 38.8 % — ABNORMAL LOW (ref 39.0–52.0)
Hemoglobin: 13.2 g/dL (ref 13.0–17.0)
Immature Granulocytes: 1 %
Lymphocytes Relative: 23 %
Lymphs Abs: 1.5 10*3/uL (ref 0.7–4.0)
MCH: 30.5 pg (ref 26.0–34.0)
MCHC: 34 g/dL (ref 30.0–36.0)
MCV: 89.6 fL (ref 80.0–100.0)
Monocytes Absolute: 0.9 10*3/uL (ref 0.1–1.0)
Monocytes Relative: 13 %
Neutro Abs: 3.8 10*3/uL (ref 1.7–7.7)
Neutrophils Relative %: 56 %
Platelets: 149 10*3/uL — ABNORMAL LOW (ref 150–400)
RBC: 4.33 MIL/uL (ref 4.22–5.81)
RDW: 14 % (ref 11.5–15.5)
WBC: 6.7 10*3/uL (ref 4.0–10.5)
nRBC: 0 % (ref 0.0–0.2)

## 2019-01-01 LAB — BASIC METABOLIC PANEL
Anion gap: 7 (ref 5–15)
BUN: 19 mg/dL (ref 8–23)
CO2: 29 mmol/L (ref 22–32)
Calcium: 8.6 mg/dL — ABNORMAL LOW (ref 8.9–10.3)
Chloride: 104 mmol/L (ref 98–111)
Creatinine, Ser: 1.18 mg/dL (ref 0.61–1.24)
GFR calc Af Amer: >60 mL/min (ref >60)
GFR calc non Af Amer: 57 mL/min — ABNORMAL LOW (ref >60)
Glucose, Bld: 112 mg/dL — ABNORMAL HIGH (ref 70–99)
Potassium: 4.5 mmol/L (ref 3.5–5.1)
Sodium: 140 mmol/L (ref 135–145)

## 2019-01-01 MED ORDER — LACTATED RINGERS IV BOLUS
1000.0000 mL | Freq: Once | INTRAVENOUS | Status: AC
  Administered 2019-01-01: 1000 mL via INTRAVENOUS

## 2019-01-01 NOTE — ED Provider Notes (Signed)
[facility] Emergency Department Provider Note  ____________________________________________   First MD Initiated Contact with Patient 01/01/19 1621     (approximate)  I have reviewed the triage vital signs and the nursing notes.   HISTORY  Chief Complaint Head Injury    HPI Chase Mendez is a 84 y.o. male  With h/o DM, HTN, HLD here with fall. Pt reportedly has a h/o recurrent falls. Has fallen multiple times in last several months due to this, and was recently started with PT at home by his PCP. Three days ago, he did not have his leg brace or walker on and bent down, causing him to fall forward. He turned backward and struck the back of his head. Bleeding was controlled easily. Since then, pt has had mild headache and has been moving around less than usual.  He admits to poor appetite as well, but this is chronic. Otherwise, he states he feels "fine," and denies other complaints. Wife, with him, confirms he is at his baseline. No fever or infectious sx. No other complaints.       Past Medical History:  Diagnosis Date   Acute encephalopathy 12/08/2014   Anxiety    ARF (acute renal failure) ([place]) 12/08/2014   Back pain    Benign neoplasm of large bowel    Capsulitis    fractured displaced metatarsal with capsulitis   Chronic back pain    Coronavirus infection    Depression    Diabetes mellitus without complication ([place])    no medications currently   Dysphagia    Exostosis    painful, right hallux   Foot drop, left    wears a brace   Frequent falls 02/2018   poor balance, foot drop   GERD (gastroesophageal reflux disease)    Gout    Hypertension    Hypothyroidism    Insomnia    Low testosterone    Microscopic hematuria 2016   Myocardial infarction [facility])    patient unaware when it happened years ago.     Pneumonia 12/07/2014   Pressure ulcer 12/09/2014   Sepsis ([place]) 12/08/2014   Thyroid disease     Patient  Active Problem List   Diagnosis Date Noted   Gout of left foot due to renal impairment 12/16/2018   Presence of neurostimulator 10/22/2018   Spinal cord stimulator dysfunction ([place]) 10/22/2018   Encounter for interrogation of neurostimulator 10/22/2018   Chronic musculoskeletal pain 09/11/2018   Neurogenic pain 09/11/2018   Claustrophobia 01/23/2018   Thoracic central spinal stenosis 01/22/2018   Cellulitis of right hand 12/06/2017   Chronic pain syndrome 07/11/2017   DDD (degenerative disc disease), lumbar 06/19/2017   Opioid Drug tolerance 06/10/2017   History of acute renal failure (12/08/2014) 06/10/2017   History of encephalopathy (12/08/2014) 06/10/2017   History of sepsis (12/08/2014) 06/10/2017   Lower limb pain, inferior (L5) (Foot Drop) (Left) 05/16/2017   Chronic low back pain (Secondary Area of Pain) (Bilateral) (ML) (L>R) with sciatica (Left) 05/16/2017   Chronic lower extremity pain (Primary Area of Pain) (Left) 05/16/2017   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   Foot drop (Left) 05/16/2017   Lumbosacral radiculopathy at L5 (Left) 05/16/2017   Disorder of skeletal system 05/16/2017   Pharmacologic therapy 05/16/2017   Problems influencing health status 05/16/2017   Venous reflux 07/12/2016   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   Varicose veins of both lower extremities with pain 05/09/2016   ARF (acute renal failure) ([place]) 12/08/2014  Pneumonia 12/07/2014    Past Surgical History:  Procedure Laterality Date   [place]   x 2   CHOLECYSTECTOMY     ESOPHAGOGASTRODUODENOSCOPY (EGD) WITH PROPOFOL N/A 04/24/2017   Procedure: ESOPHAGOGASTRODUODENOSCOPY (EGD) WITH PROPOFOL;  Surgeon: Manya Silvas, MD;  Location: [facility] ENDOSCOPY;  Service: Endoscopy;  Laterality: N/A;   EYE SURGERY Bilateral 1983, 1985   cataract extractions   SPINAL CORD STIMULATOR INSERTION N/A 03/13/2018   Procedure: SPINAL  CORD STIMULATOR INSERTION;  Surgeon: Meade Maw, MD;  Location: [facility] ORS;  Service: Neurosurgery;  Laterality: N/A;    Prior to Admission medications   Medication Sig Start Date End Date Taking? Authorizing Provider  allopurinol (ZYLOPRIM) 100 MG tablet Take 100 mg by mouth daily. 10/26/14   [provider]  aspirin 81 MG tablet Take 81 mg by mouth daily.    [provider]  atorvastatin (LIPITOR) 20 MG tablet Take 20 mg by mouth daily.     [provider]  citalopram (CELEXA) 40 MG tablet Take 40 mg by mouth daily.     [provider]  diphenhydrAMINE (BENADRYL) 25 MG tablet Take 25 mg by mouth 2 (two) times daily.     [provider]  fluticasone (FLONASE) 50 MCG/ACT nasal spray Place 1 spray into both nostrils daily as needed for rhinitis.     [provider]  gabapentin (NEURONTIN) 600 MG tablet Take 1 tablet (600 mg total) by mouth every 6 (six) hours. 12/16/18 06/14/19  Milinda Pointer, MD  isosorbide mononitrate (IMDUR) 30 MG 24 hr tablet Take 30 mg by mouth daily.    [provider]  levothyroxine (SYNTHROID, LEVOTHROID) 125 MCG tablet Take 125 mcg by mouth daily before breakfast.     [provider]  linaclotide (LINZESS) 290 MCG CAPS capsule Take 290 mcg by mouth daily as needed (for constipation).     [provider]  losartan (COZAAR) 50 MG tablet Take 50 mg by mouth daily.    [provider]  mirabegron ER (MYRBETRIQ) 25 MG TB24 tablet Take 25 mg by mouth daily.    [provider]  naloxone [facility]) nasal spray 4 mg/0.1 mL Spray into one nostril. Repeat with second device into other nostril after 2-3 minutes if no or minimal response. Use in case of opioid overdose. Patient taking differently: Place 1 spray into the nose once. Spray into one nostril. Repeat with second device into other nostril after 2-3 minutes if no or minimal response. Use in case of opioid overdose. 09/04/17    Vevelyn Francois, NP  ondansetron (ZOFRAN ODT) 4 MG disintegrating tablet Take 1 tablet (4 mg total) by mouth every 8 (eight) hours as needed for nausea or vomiting. 08/21/16   Darel Hong, MD  oxyCODONE (ROXICODONE) 15 MG immediate release tablet Take 1 tablet (15 mg total) by mouth every 6 (six) hours as needed for pain. Must last 30 days 12/18/18 01/17/19  Milinda Pointer, MD  oxyCODONE (ROXICODONE) 15 MG immediate release tablet Take 1 tablet (15 mg total) by mouth every 6 (six) hours as needed for pain. Must last 30 days 01/17/19 02/16/19  Milinda Pointer, MD  oxyCODONE (ROXICODONE) 15 MG immediate release tablet Take 1 tablet (15 mg total) by mouth every 6 (six) hours as needed for pain. Must last 30 days 02/16/19 03/18/19  Milinda Pointer, MD  pantoprazole (PROTONIX) 40 MG tablet Take 40 mg by mouth 2 (two) times daily. 02/04/18 07/24/18  [provider]  Testosterone (ANDROGEL) 40.5 MG/2.5GM (1.62%) GEL Place 2 Pump onto the skin 3 (three) times a week.    [provider]  tiZANidine (ZANAFLEX) 4 MG capsule Take 1 capsule (4 mg total) by mouth 3 (three) times daily. 12/16/18 06/14/19  Milinda Pointer, MD  vitamin B-12 (CYANOCOBALAMIN) 1000 MCG tablet Take 1,000 mcg by mouth daily.    [provider]  vitamin E 400 UNIT capsule Take 400 Units by mouth daily.    [provider]    Allergies Ibuprofen, Sulfa antibiotics, and Sulfasalazine  Family History  Problem Relation Age of Onset   Heart disease Mother    Diabetes Father    Kidney cancer Brother     Social History Social History   Tobacco Use   Smoking status: Current Every Day Smoker    Packs/day: 0.50    Years: 70.00    Pack years: 35.00    Types: Cigars   Smokeless tobacco: Never Used   Tobacco comment: unable to give cessation materials due to webex visit   Substance Use Topics   Alcohol use: No   Drug use: Yes    Types: Oxycodone    Review of Systems  Review of  Systems  Constitutional: Positive for fatigue. Negative for chills and fever.  HENT: Negative for sore throat.   Respiratory: Negative for shortness of breath.   Cardiovascular: Negative for chest pain.  Gastrointestinal: Negative for abdominal pain.  Genitourinary: Negative for flank pain.  Musculoskeletal: Negative for neck pain.  Skin: Positive for wound. Negative for rash.  Allergic/Immunologic: Negative for immunocompromised state.  Neurological: Positive for headaches. Negative for weakness and numbness.  Hematological: Does not bruise/bleed easily.  All other systems reviewed and are negative.    ____________________________________________  PHYSICAL EXAM:      VITAL SIGNS: ED Triage Vitals  Enc Vitals Group     BP 01/01/19 1610 (!) 87/45     Pulse Rate 01/01/19 1607 68     Resp 01/01/19 1607 20     Temp 01/01/19 1607 98.1 F (36.7 C)     Temp Source 01/01/19 1607 Oral     SpO2 01/01/19 1607 98 %     Weight 01/01/19 1607 198 lb (89.8 kg)     Height 01/01/19 1607 6' (1.829 m)     Head Circumference --      Peak Flow --      Pain Score 01/01/19 1610 0     Pain Loc --      Pain Edu? --      Excl. in [place]? --      Physical Exam Vitals signs and nursing note reviewed.  Constitutional:      General: He is not in acute distress.    Appearance: He is well-developed.  HENT:     Head: Normocephalic and atraumatic.     Comments: approx 5 cm superficial, healing laceration to occipital scalp, no surrounding erythema or bruising, no drainage Eyes:     Conjunctiva/sclera: Conjunctivae normal.  Neck:     Musculoskeletal: Neck supple.     Comments: No midline or paraspinal TTP Cardiovascular:     Rate and Rhythm: Normal rate and regular rhythm.     Heart sounds: Normal heart sounds. No murmur. No friction rub.  Pulmonary:     Effort: Pulmonary effort is normal. No respiratory distress.     Breath sounds: Normal breath sounds. No wheezing or rales.  Abdominal:      General:  There is no distension.     Palpations: Abdomen is soft.     Tenderness: There is no abdominal tenderness.  Skin:    General: Skin is warm.     Capillary Refill: Capillary refill takes less than 2 seconds.  Neurological:     Mental Status: He is alert and oriented to person, place, and time.     Motor: No abnormal muscle tone.     Comments: Neurological Exam:  Mental Status: Alert and oriented to person, place, and time. Attention and concentration normal. Speech clear. Recent memory is intact. Cranial Nerves: Visual fields grossly intact. EOMI and PERRLA. No nystagmus noted. Facial sensation intact at forehead, maxillary cheek, and chin/mandible bilaterally. No facial asymmetry or weakness. Hearing grossly normal. Uvula is midline, and palate elevates symmetrically. Normal SCM and trapezius strength. Tongue midline without fasciculations. Motor: Muscle strength 5/5 in proximal and distal UE and LE bilaterally. No pronator drift. Muscle tone normal. Sensation: Intact to light touch in upper and lower extremities distally bilaterally.  Gait: Normal without ataxia. Coordination: Normal FTN bilaterally.          ____________________________________________   LABS (all labs ordered are listed, but only abnormal results are displayed)  Labs Reviewed  CBC WITH DIFFERENTIAL/PLATELET - Abnormal; Notable for the following components:      Result Value   HCT 38.8 (*)    Platelets 149 (*)    All other components within normal limits  BASIC METABOLIC PANEL - Abnormal; Notable for the following components:   Glucose, Bld 112 (*)    Calcium 8.6 (*)    GFR calc non Af Amer 57 (*)    All other components within normal limits    ____________________________________________  EKG: Sinus bradycardia, VR 58. QRS 138, QTc 453. No acute ST elevations or depressions.  ________________________________________  RADIOLOGY All imaging, including plain films, CT scans, and ultrasounds,  independently reviewed by me, and interpretations confirmed via formal radiology reads.  ED MD interpretation:   CT Head: NAICA CT CSpine: Neg  Official radiology report(s): Ct Head Wo Contrast  Result Date: 01/01/2019 CLINICAL DATA:  Frequent falls, headache EXAM: CT HEAD WITHOUT CONTRAST CT CERVICAL SPINE WITHOUT CONTRAST TECHNIQUE: Multidetector CT imaging of the head and cervical spine was performed following the standard protocol without intravenous contrast. Multiplanar CT image reconstructions of the cervical spine were also generated. COMPARISON:  11/28/2018 FINDINGS: CT HEAD FINDINGS Brain: No evidence of acute infarction, hemorrhage, hydrocephalus, extra-axial collection or mass lesion/mass effect. Periventricular white matter hypodensity and global volume loss. Vascular: No hyperdense vessel or unexpected calcification. Skull: Normal. Negative for fracture or focal lesion. Sinuses/Orbits: No acute finding. Other: None. CT CERVICAL SPINE FINDINGS Alignment: Normal. Skull base and vertebrae: No acute fracture. No primary bone lesion or focal pathologic process. Soft tissues and spinal canal: No prevertebral fluid or swelling. No visible canal hematoma. Disc levels: Moderate multilevel disc degenerative disease and osteophytosis. Upper chest: Negative. Other: None. IMPRESSION: 1. No acute intracranial pathology. Small-vessel white matter disease and global volume loss. 2. No fracture or static subluxation of the cervical spine. Multilevel disc degenerative disease. Electronically Signed   By: Eddie Candle M.D.   On: 01/01/2019 17:04   Ct Cervical Spine Wo Contrast  Result Date: 01/01/2019 CLINICAL DATA:  Frequent falls, headache EXAM: CT HEAD WITHOUT CONTRAST CT CERVICAL SPINE WITHOUT CONTRAST TECHNIQUE: Multidetector CT imaging of the head and cervical spine was performed following the standard protocol without intravenous contrast. Multiplanar CT image reconstructions of the cervical  spine  were also generated. COMPARISON:  11/28/2018 FINDINGS: CT HEAD FINDINGS Brain: No evidence of acute infarction, hemorrhage, hydrocephalus, extra-axial collection or mass lesion/mass effect. Periventricular white matter hypodensity and global volume loss. Vascular: No hyperdense vessel or unexpected calcification. Skull: Normal. Negative for fracture or focal lesion. Sinuses/Orbits: No acute finding. Other: None. CT CERVICAL SPINE FINDINGS Alignment: Normal. Skull base and vertebrae: No acute fracture. No primary bone lesion or focal pathologic process. Soft tissues and spinal canal: No prevertebral fluid or swelling. No visible canal hematoma. Disc levels: Moderate multilevel disc degenerative disease and osteophytosis. Upper chest: Negative. Other: None. IMPRESSION: 1. No acute intracranial pathology. Small-vessel white matter disease and global volume loss. 2. No fracture or static subluxation of the cervical spine. Multilevel disc degenerative disease. Electronically Signed   By: Eddie Candle M.D.   On: 01/01/2019 17:04    ____________________________________________  PROCEDURES   Procedure(s) performed (including Critical Care):  Procedures  ____________________________________________  INITIAL IMPRESSION / MDM / [place] / ED COURSE  As part of my medical decision making, I reviewed the following data within the [place] notes reviewed and incorporated, Old chart reviewed, Notes from prior ED visits, and [place] [employer] Sub[employer] [place] was evaluated in Emergency Department on 01/01/2019 for the symptoms described in the history of present illness. He was evaluated in the context of the global COVID-19 pandemic, which necessitated consideration that the patient might be at risk for infection with the SARS-CoV-2 virus that causes COVID-19. Institutional protocols and algorithms that pertain to the evaluation of patients at risk for  COVID-19 are in a state of rapid change based on information released by regulatory bodies including the CDC and federal and state organizations. These policies and algorithms were followed during the patient's care in the ED.  Some ED evaluations and interventions may be delayed as a result of limited staffing during the pandemic.*     Medical Decision Making:  84 yo F here with head injury from fall 3 days ago. Pt is HDS and well appearing. Wound is well approximated with no signs of infection. CT Head/C-Spine neg. No UE weakness, tingling or signs of central cord or other occult injury. Re: fall - suspect this is 2/2 his known foot drop and chronic ataxia. He is adamant he is o/w well. He was noted to by hypotensive on arrival after standing, and he does admit to poor PO intake. Labs w/o acute abnormality. No worsening anemia, lytes acceptable. Feels markedly improved and is ambulatory with fluids. EKG nonischemic. Would like to return home which I think is reasonable. Tetanus UTD.   ____________________________________________  FINAL CLINICAL IMPRESSION(S) / ED DIAGNOSES  Final diagnoses:  Injury of head, initial encounter  Dehydration  Orthostatic dizziness     MEDICATIONS GIVEN DURING THIS VISIT:  Medications  lactated ringers bolus 1,000 mL (0 mLs Intravenous Stopped 01/01/19 1941)     ED Discharge Orders    None       Note:  This document was prepared using Dragon voice recognition software and may include unintentional dictation errors.   Duffy Bruce, MD 01/01/19 2018

## 2019-01-01 NOTE — ED Triage Notes (Signed)
States fell backward Monday evening and hit head on the door.  States he falls fairly frequently.  Last fall about 2 months ago.  Denies LOC.

## 2019-01-01 NOTE — ED Notes (Signed)
Pt with hx of multiple falls, fell on Monday backwards causing laceration to posterior head. Pt has PT at home weekly and was referred to ER due to fall and suggested to have a head CT. Pt had low BP in triage, systolic over [id number] in ER bed. Denies any new onset of confusion, weakness or fatigue.

## 2019-01-01 NOTE — ED Notes (Signed)
Pt unable to stand for 3 minutes for orthostatic BP due to weakness in legs.

## 2019-01-09 ENCOUNTER — Encounter: Payer: Self-pay | Admitting: Pain Medicine

## 2019-01-09 NOTE — Progress Notes (Signed)
Pain relief after procedure (treated area only): (Questions asked to patient) 1. Starting about 15 minutes after the procedure, and "while the area was still numb" (from the local anesthetics), were you having any of your usual pain "in that area" (the treated area)?  (NOTE: NOT including the discomfort from the needle sticks.) First 1 hour:100% better. First 4-6 hours:100% better. 2. Assuming that it did get numb. How long was the area numb? 100 % benefit, longer than 6 hours. How long?3-4 hours. 3. How much better is your pain now, when compared to before the procedure? Current benefit:0  % better. 4. Can you move better now? Improvement in ROM (Range of Motion): No. 5. Can you do more now? Improvement in function: No. 4. Did you have any problems with the procedure? Side-effects/Complications: No.

## 2019-01-13 ENCOUNTER — Other Ambulatory Visit: Payer: Self-pay

## 2019-01-13 ENCOUNTER — Ambulatory Visit: Payer: Medicare Other | Attending: Pain Medicine | Admitting: Pain Medicine

## 2019-01-13 DIAGNOSIS — M961 Postlaminectomy syndrome, not elsewhere classified: Secondary | ICD-10-CM | POA: Diagnosis not present

## 2019-01-13 DIAGNOSIS — G894 Chronic pain syndrome: Secondary | ICD-10-CM | POA: Diagnosis not present

## 2019-01-13 DIAGNOSIS — M79605 Pain in left leg: Secondary | ICD-10-CM | POA: Diagnosis not present

## 2019-01-13 DIAGNOSIS — M5442 Lumbago with sciatica, left side: Secondary | ICD-10-CM | POA: Diagnosis not present

## 2019-01-13 DIAGNOSIS — G8929 Other chronic pain: Secondary | ICD-10-CM

## 2019-01-13 DIAGNOSIS — M21372 Foot drop, left foot: Secondary | ICD-10-CM

## 2019-01-13 MED ORDER — OXYCODONE HCL 15 MG PO TABS: 15.0000 mg | ORAL_TABLET | Freq: Four times a day (QID) | ORAL | 0 refills | Status: DC | PRN

## 2019-01-13 NOTE — Progress Notes (Signed)
`Pain Management Virtual Encounter Note - Virtual Visit via Telephone Telehealth (real-time audio visits between healthcare provider and patient).   Patient's Phone No. & Preferred Pharmacy:  [phone] (home); There is no such number on file (mobile).; (Preferred) [phone] [email or web address]  [facility] - [place], [place] [address] Phone: [phone] Fax: [phone]    Pre-screening note:  Our staff contacted Mr. Sakai and offered him an "in person", "face-to-face" appointment versus a telephone encounter. He indicated preferring the telephone encounter, at this time.   Reason for Virtual Visit: COVID-19*  Social distancing based on CDC and AMA recommendations.   I contacted [name] on 01/13/2019 via telephone.      I clearly identified myself as Gaspar Cola, MD. I verified that I was speaking with the correct person using two identifiers (Name: DASHEL NULF, and date of birth: [birth year]).  Advanced Informed Consent I sought verbal advanced consent from [name] for virtual visit interactions. I informed Mr. Bracher of possible security and privacy concerns, risks, and limitations associated with providing "not-in-person" medical evaluation and management services. I also informed Mr. Hacking of the availability of "in-person" appointments. Finally, I informed him that there would be a charge for the virtual visit and that he could be  personally, fully or partially, financially responsible for it. Mr. Molski expressed understanding and agreed to proceed.   Historic Elements   [name] is a 84 y.o. year old, male patient evaluated today after his last encounter by our practice on 12/25/2018. Mr. Thone  has a past medical history of Acute encephalopathy (12/08/2014), Anxiety, ARF (acute renal failure) ([place]) (12/08/2014), Back pain, Benign neoplasm of large bowel, Capsulitis, Chronic back pain,  Coronavirus infection, Depression, Diabetes mellitus without complication ([place]), Dysphagia, Exostosis, Foot drop, left, Frequent falls (02/2018), GERD (gastroesophageal reflux disease), Gout, Hypertension, Hypothyroidism, Insomnia, Low testosterone, Microscopic hematuria (2016), Myocardial infarction ([place]), Pneumonia (12/07/2014), Pressure ulcer (12/09/2014), Sepsis ([place]) (12/08/2014), and Thyroid disease. He also  has a past surgical history that includes Cholecystectomy; Esophagogastroduodenoscopy (egd) with propofol (N/A, 04/24/2017); Back surgery (1995, 1996); Eye surgery (Bilateral, 1983, 1985); Appendectomy; and Spinal cord stimulator insertion (N/A, 03/13/2018). Mr. Greenlief has a current medication list which includes the following prescription(s): allopurinol, aspirin, atorvastatin, citalopram, diphenhydramine, fluticasone, gabapentin, isosorbide mononitrate, levothyroxine, linaclotide, losartan, mirabegron er, naloxone, ondansetron, oxycodone, oxycodone, oxycodone, testosterone, tizanidine, vitamin b-12, vitamin e, and pantoprazole. He  reports that he has been smoking cigars. He has a 35.00 pack-year smoking history. He has never used smokeless tobacco. He reports current drug use. Drug: Oxycodone. He reports that he does not drink alcohol. Mr. Dekam is allergic to ibuprofen and sulfa antibiotics.   HPI  Today, he is being contacted for both, medication management and a post-procedure assessment.  Today we called the patient to get some feedback from the procedure that we recently completed for him.  He is extremely difficult to get information out of.  He indicated that for the duration of the local anesthetic he had complete relief of the pain.  However, when he comes to the long-term benefit, he refers having had very little benefit.  The only difference that he can tell is less pain in the proximal portion of the left leg, but from the knee down and remains the same.  At this point, based on these  results, we will continue to manage him with the medications, but he does not seem to be  getting a whole lot of benefit from the injections.  However, because this will depend on possible changes that may occur in the future, we will not rule out the possibility that he may need some later on.  Post-Procedure Evaluation  Procedure: Diagnostic/therapeutic left-sided L5 transforaminal ESI #2 under fluoroscopic guidance and IV sedation Pre-procedure pain level:  5/10 Post-procedure: 0/10 (100% relief)  Sedation: Sedation provided.  Landis Martins, RN  01/09/2019  9:16 AM  Sign when Signing Visit Pain relief after procedure (treated area only): (Questions asked to patient) 1. Starting about 15 minutes after the procedure, and "while the area was still numb" (from the local anesthetics), were you having any of your usual pain "in that area" (the treated area)?  (NOTE: NOT including the discomfort from the needle sticks.) First 1 hour:100% better. First 4-6 hours:100 % better. 2. Assuming that it did get numb. How long was the area numb? 100 % benefit, longer than 6 hours. How long? 3-4 hours. 3. How much better is your pain now, when compared to before the procedure? Current benefit: 0 % better. 4. Can you move better now? Improvement in ROM (Range of Motion): No. 5. Can you do more now? Improvement in function: No. 4. Did you have any problems with the procedure? Side-effects/Complications: No.   Current benefits: Defined as benefit that persist at this time.   Analgesia:  Back to baseline Function: Back to baseline ROM: Back to baseline  Pharmacotherapy Assessment  Analgesic: Oxycodone IR 15 mg, 1 tab PO q 6 hrs. (60 mg/day of oxycodone) MME/day:90mg /day.   Monitoring: Pharmacotherapy: No side-effects or adverse reactions reported. [place] PMP: PDMP reviewed during this encounter.       Compliance: No problems identified. Effectiveness: Clinically acceptable. Plan: Refer to  "POC".  UDS:  Summary  Date Value Ref Range Status  11/14/2017 FINAL  Final    Comment:    ==================================================================== TOXASSURE SELECT 13 (MW) ==================================================================== Test                             Result       Flag       Units Drug Present and Declared for Prescription Verification   Oxycodone                      4937         EXPECTED   ng/mg creat   Oxymorphone                    1650         EXPECTED   ng/mg creat   Noroxycodone                   6664         EXPECTED   ng/mg creat   Noroxymorphone                 520          EXPECTED   ng/mg creat    Sources of oxycodone are scheduled prescription medications.    Oxymorphone, noroxycodone, and noroxymorphone are expected    metabolites of oxycodone. Oxymorphone is also available as a    scheduled prescription medication. ==================================================================== Test                      Result    Flag   Units  Ref Range   Creatinine              139              mg/dL      >=20 ==================================================================== Declared Medications:  The flagging and interpretation on this report are based on the  following declared medications.  Unexpected results may arise from  inaccuracies in the declared medications.  **Note: The testing scope of this panel includes these medications:  Oxycodone  **Note: The testing scope of this panel does not include following  reported medications:  Allopurinol  Aspirin (Aspirin 81)  Atorvastatin  Citalopram  Cyanocobalamin  Diphenhydramine  Fluticasone  Gabapentin  Isosorbide  Levothyroxine  Linaclotide  Losartan (Losartan Potassium)  Mirabegron  Naloxone  Omeprazole  Ondansetron  Testosterone  Vitamin E ==================================================================== For clinical consultation, please call [phone]. ====================================================================    Laboratory Chemistry Profile (12 mo)  Renal: 01/01/2019: BUN 19; Creatinine, Ser 1.18  Lab Results  Component Value Date   GFRAA >60 01/01/2019   GFRNONAA 57 (L) 01/01/2019   Hepatic: 11/28/2018: Albumin 3.9 Lab Results  Component Value Date   AST 75 (H) 11/28/2018   ALT 20 11/28/2018   Other: 07/24/2018: Vitamin B-12 981 Note: Above Lab results reviewed.  Imaging  CT Cervical Spine Wo Contrast CLINICAL DATA:  Frequent falls, headache  EXAM: CT HEAD WITHOUT CONTRAST  CT CERVICAL SPINE WITHOUT CONTRAST  TECHNIQUE: Multidetector CT imaging of the head and cervical spine was performed following the standard protocol without intravenous contrast. Multiplanar CT image reconstructions of the cervical spine were also generated.  COMPARISON:  11/28/2018  FINDINGS: CT HEAD FINDINGS  Brain: No evidence of acute infarction, hemorrhage, hydrocephalus, extra-axial collection or mass lesion/mass effect. Periventricular white matter hypodensity and global volume loss.  Vascular: No hyperdense vessel or unexpected calcification.  Skull: Normal. Negative for fracture or focal lesion.  Sinuses/Orbits: No acute finding.  Other: None.  CT CERVICAL SPINE FINDINGS  Alignment: Normal.  Skull base and vertebrae: No acute fracture. No primary bone lesion or focal pathologic process.  Soft tissues and spinal canal: No prevertebral fluid or swelling. No visible canal hematoma.  Disc levels: Moderate multilevel disc degenerative disease and osteophytosis.  Upper chest: Negative.  Other: None.  IMPRESSION: 1. No acute intracranial pathology. Small-vessel white matter disease and global volume loss.  2. No fracture or static subluxation of the cervical spine. Multilevel disc degenerative disease.  Electronically Signed   By: Eddie Candle M.D.   On: 01/01/2019 17:04 CT Head Wo  Contrast CLINICAL DATA:  Frequent falls, headache  EXAM: CT HEAD WITHOUT CONTRAST  CT CERVICAL SPINE WITHOUT CONTRAST  TECHNIQUE: Multidetector CT imaging of the head and cervical spine was performed following the standard protocol without intravenous contrast. Multiplanar CT image reconstructions of the cervical spine were also generated.  COMPARISON:  11/28/2018  FINDINGS: CT HEAD FINDINGS  Brain: No evidence of acute infarction, hemorrhage, hydrocephalus, extra-axial collection or mass lesion/mass effect. Periventricular white matter hypodensity and global volume loss.  Vascular: No hyperdense vessel or unexpected calcification.  Skull: Normal. Negative for fracture or focal lesion.  Sinuses/Orbits: No acute finding.  Other: None.  CT CERVICAL SPINE FINDINGS  Alignment: Normal.  Skull base and vertebrae: No acute fracture. No primary bone lesion or focal pathologic process.  Soft tissues and spinal canal: No prevertebral fluid or swelling. No visible canal hematoma.  Disc levels: Moderate multilevel disc degenerative disease and osteophytosis.  Upper chest: Negative.  Other: None.  IMPRESSION: 1. No acute intracranial pathology. Small-vessel white matter disease and global volume loss.  2. No fracture or static subluxation of the cervical spine. Multilevel disc degenerative disease.  Electronically Signed   By: Eddie Candle M.D.   On: 01/01/2019 17:04   Assessment  The primary encounter diagnosis was Chronic pain syndrome. Diagnoses of Chronic lower extremity pain (Primary Area of Pain) (Left), Chronic low back pain (Secondary Area of Pain) (Bilateral) (ML) (L>R) with sciatica (Left), Failed back surgical syndrome, Foot drop (Left), and Lower limb pain, inferior (L5) (Foot Drop) (Left) were also pertinent to this visit.  Plan of Care  Problem-specific:  No problem-specific Assessment & Plan notes found for this encounter.  I am having Evert Kohl.  Stann Mainland "Ronalee Belts" maintain his allopurinol, levothyroxine, atorvastatin, ondansetron, fluticasone, citalopram, linaclotide, vitamin E, losartan, mirabegron ER, diphenhydrAMINE, naloxone, pantoprazole, isosorbide mononitrate, vitamin B-12, Testosterone, aspirin, oxyCODONE, oxyCODONE, tiZANidine, gabapentin, and oxyCODONE.  Pharmacotherapy (Medications Ordered): Meds ordered this encounter  Medications  . oxyCODONE (ROXICODONE) 15 MG immediate release tablet    Sig: Take 1 tablet (15 mg total) by mouth every 6 (six) hours as needed for pain. Must last 30 days    Dispense:  120 tablet    Refill:  0    Chronic Pain: STOP Act (Not applicable) Fill 1 day early if closed on refill date. Do not fill until: 03/18/2019. To last until: 04/17/2019. Avoid benzodiazepines within 8 hours of opioids   Orders:  No orders of the defined types were placed in this encounter.  Follow-up plan:   Return in about 3 months (around 04/16/2019) for (VV), (MM).      Interventional treatment options:  Under consideration:   Diagnostic Caudal ESI + epidurogram #1 Possible Racz procedure Diagnostic bilateral lumbar facet block Possible bilateral lumbar facet RFA Possible candidate for intrathecal pump trial and implant   Therapeutic/palliative (PRN):   Therapeutic/palliative left L4TFESI#2 Therapeutic/palliative left L5TFESI#2 Bilateral spinal cord stimulator trial (done - 01/17/2018)  Permanent bilateral spinal cord stimulator implant by Dr. Cari Caraway (neurosurgery) (done - 03/13/2018)    Recent Visits Date Type Provider Dept  12/24/18 Procedure visit Milinda Pointer, MD [facility]  12/16/18 Telemedicine Milinda Pointer, [facility]  10/22/18 Procedure visit Milinda Pointer, MD [facility]  Showing recent visits within past 90 days and meeting all other requirements   Today's Visits Date Type Provider Dept  01/13/19 Telemedicine Milinda Pointer, MD [facility]  Showing today's visits and meeting all other requirements   Future Appointments Date Type Provider Dept  03/17/19 Appointment Milinda Pointer, MD [facility]  Showing future appointments within next 90 days and meeting all other requirements   I discussed the assessment and treatment plan with the patient. The patient was provided an opportunity to ask questions and all were answered. The patient agreed with the plan and demonstrated an understanding of the instructions.  Patient advised to call back or seek an in-person evaluation if the symptoms or condition worsens.  Total duration of non-face-to-face encounter: 15 minutes.  Note by: Gaspar Cola, MD Date: 01/13/2019; Time: 9:19 AM  Note: This dictation was prepared with Dragon dictation. Any transcriptional errors that may result from this process are unintentional.  Disclaimer:  * Given the special circumstances of the COVID-19 pandemic, the federal government has announced that the Office for Civil Rights (OCR) will exercise its enforcement discretion and will not impose penalties on physicians using telehealth in the event of noncompliance with regulatory  requirements under the [employer] and Accountability Act (HIPAA) in connection with the good faith provision of telehealth during the TSSQS-47 national public health emergency. (AMA)

## 2019-01-27 ENCOUNTER — Emergency Department: Payer: Medicare Other

## 2019-01-27 ENCOUNTER — Other Ambulatory Visit: Payer: Self-pay

## 2019-01-27 ENCOUNTER — Observation Stay
Admission: EM | Admit: 2019-01-27 | Discharge: 2019-01-28 | Disposition: A | Discharge status: Home / Self Care | Payer: Medicare Other | Attending: Internal Medicine | Admitting: Internal Medicine

## 2019-01-27 DIAGNOSIS — M545 Low back pain: Secondary | ICD-10-CM | POA: Diagnosis present

## 2019-01-27 DIAGNOSIS — F329 Major depressive disorder, single episode, unspecified: Secondary | ICD-10-CM | POA: Diagnosis not present

## 2019-01-27 DIAGNOSIS — E785 Hyperlipidemia, unspecified: Secondary | ICD-10-CM | POA: Diagnosis not present

## 2019-01-27 DIAGNOSIS — Z7982 Long term (current) use of aspirin: Secondary | ICD-10-CM | POA: Diagnosis not present

## 2019-01-27 DIAGNOSIS — G894 Chronic pain syndrome: Secondary | ICD-10-CM | POA: Diagnosis not present

## 2019-01-27 DIAGNOSIS — I10 Essential (primary) hypertension: Secondary | ICD-10-CM | POA: Diagnosis not present

## 2019-01-27 DIAGNOSIS — M792 Neuralgia and neuritis, unspecified: Secondary | ICD-10-CM

## 2019-01-27 DIAGNOSIS — E860 Dehydration: Secondary | ICD-10-CM | POA: Diagnosis present

## 2019-01-27 DIAGNOSIS — G4700 Insomnia, unspecified: Secondary | ICD-10-CM | POA: Diagnosis present

## 2019-01-27 DIAGNOSIS — Z8249 Family history of ischemic heart disease and other diseases of the circulatory system: Secondary | ICD-10-CM | POA: Diagnosis not present

## 2019-01-27 DIAGNOSIS — E039 Hypothyroidism, unspecified: Secondary | ICD-10-CM | POA: Diagnosis not present

## 2019-01-27 DIAGNOSIS — K219 Gastro-esophageal reflux disease without esophagitis: Secondary | ICD-10-CM | POA: Diagnosis not present

## 2019-01-27 DIAGNOSIS — Z20828 Contact with and (suspected) exposure to other viral communicable diseases: Secondary | ICD-10-CM | POA: Diagnosis not present

## 2019-01-27 DIAGNOSIS — M109 Gout, unspecified: Secondary | ICD-10-CM | POA: Diagnosis present

## 2019-01-27 DIAGNOSIS — I4510 Unspecified right bundle-branch block: Secondary | ICD-10-CM | POA: Diagnosis not present

## 2019-01-27 DIAGNOSIS — Z833 Family history of diabetes mellitus: Secondary | ICD-10-CM | POA: Diagnosis not present

## 2019-01-27 DIAGNOSIS — S0990XA Unspecified injury of head, initial encounter: Secondary | ICD-10-CM | POA: Diagnosis present

## 2019-01-27 DIAGNOSIS — F419 Anxiety disorder, unspecified: Secondary | ICD-10-CM | POA: Diagnosis not present

## 2019-01-27 DIAGNOSIS — I252 Old myocardial infarction: Secondary | ICD-10-CM | POA: Insufficient documentation

## 2019-01-27 DIAGNOSIS — Z79899 Other long term (current) drug therapy: Secondary | ICD-10-CM | POA: Diagnosis not present

## 2019-01-27 DIAGNOSIS — R296 Repeated falls: Secondary | ICD-10-CM | POA: Diagnosis not present

## 2019-01-27 DIAGNOSIS — R262 Difficulty in walking, not elsewhere classified: Secondary | ICD-10-CM | POA: Diagnosis not present

## 2019-01-27 DIAGNOSIS — R410 Disorientation, unspecified: Secondary | ICD-10-CM | POA: Diagnosis present

## 2019-01-27 DIAGNOSIS — R441 Visual hallucinations: Secondary | ICD-10-CM | POA: Diagnosis present

## 2019-01-27 DIAGNOSIS — Z881 Allergy status to other antibiotic agents status: Secondary | ICD-10-CM | POA: Insufficient documentation

## 2019-01-27 DIAGNOSIS — F19921 Other psychoactive substance use, unspecified with intoxication with delirium: Secondary | ICD-10-CM | POA: Diagnosis not present

## 2019-01-27 DIAGNOSIS — E119 Type 2 diabetes mellitus without complications: Secondary | ICD-10-CM | POA: Diagnosis not present

## 2019-01-27 DIAGNOSIS — Z882 Allergy status to sulfonamides status: Secondary | ICD-10-CM | POA: Insufficient documentation

## 2019-01-27 DIAGNOSIS — Z79890 Hormone replacement therapy (postmenopausal): Secondary | ICD-10-CM | POA: Diagnosis not present

## 2019-01-27 DIAGNOSIS — G9341 Metabolic encephalopathy: Secondary | ICD-10-CM | POA: Diagnosis present

## 2019-01-27 DIAGNOSIS — W19XXXA Unspecified fall, initial encounter: Secondary | ICD-10-CM | POA: Diagnosis not present

## 2019-01-27 DIAGNOSIS — F17290 Nicotine dependence, other tobacco product, uncomplicated: Secondary | ICD-10-CM | POA: Diagnosis not present

## 2019-01-27 DIAGNOSIS — Z886 Allergy status to analgesic agent status: Secondary | ICD-10-CM | POA: Insufficient documentation

## 2019-01-27 LAB — URINALYSIS, COMPLETE (UACMP) WITH MICROSCOPIC
Bacteria, UA: NONE SEEN
Bilirubin Urine: NEGATIVE
Glucose, UA: NEGATIVE mg/dL
Hgb urine dipstick: NEGATIVE
Ketones, ur: NEGATIVE mg/dL
Leukocytes,Ua: NEGATIVE
Nitrite: NEGATIVE
Protein, ur: NEGATIVE mg/dL
Specific Gravity, Urine: 1.016 (ref 1.005–1.030)
pH: 5 (ref 5.0–8.0)

## 2019-01-27 LAB — CBC
HCT: 40.4 % (ref 39.0–52.0)
Hemoglobin: 13.5 g/dL (ref 13.0–17.0)
MCH: 30 pg (ref 26.0–34.0)
MCHC: 33.4 g/dL (ref 30.0–36.0)
MCV: 89.8 fL (ref 80.0–100.0)
Platelets: 152 10*3/uL (ref 150–400)
RBC: 4.5 MIL/uL (ref 4.22–5.81)
RDW: 13 % (ref 11.5–15.5)
WBC: 6.8 10*3/uL (ref 4.0–10.5)
nRBC: 0 % (ref 0.0–0.2)

## 2019-01-27 LAB — BASIC METABOLIC PANEL
Anion gap: 9 (ref 5–15)
BUN: 20 mg/dL (ref 8–23)
CO2: 26 mmol/L (ref 22–32)
Calcium: 8.6 mg/dL — ABNORMAL LOW (ref 8.9–10.3)
Chloride: 103 mmol/L (ref 98–111)
Creatinine, Ser: 1.24 mg/dL (ref 0.61–1.24)
GFR calc Af Amer: >60 mL/min (ref >60)
GFR calc non Af Amer: 54 mL/min — ABNORMAL LOW (ref >60)
Glucose, Bld: 137 mg/dL — ABNORMAL HIGH (ref 70–99)
Potassium: 4.2 mmol/L (ref 3.5–5.1)
Sodium: 138 mmol/L (ref 135–145)

## 2019-01-27 LAB — TSH: TSH: 1.425 u[IU]/mL (ref 0.350–4.500)

## 2019-01-27 MED ORDER — SODIUM CHLORIDE 0.9 % IV SOLN: INTRAVENOUS | Status: DC

## 2019-01-27 MED ORDER — IPRATROPIUM-ALBUTEROL 0.5-2.5 (3) MG/3ML IN SOLN: 3.0000 mL | Freq: Once | RESPIRATORY_TRACT | Status: DC

## 2019-01-27 MED ORDER — TESTOSTERONE 40.5 MG/2.5GM (1.62%) TD GEL: 2.0000 | TRANSDERMAL | Status: DC

## 2019-01-27 MED ORDER — SODIUM CHLORIDE 0.9 % IV BOLUS
1000.0000 mL | Freq: Once | INTRAVENOUS | Status: AC
  Administered 2019-01-27: 1000 mL via INTRAVENOUS

## 2019-01-27 MED ORDER — ATORVASTATIN CALCIUM 20 MG PO TABS
20.0000 mg | ORAL_TABLET | Freq: Every day | ORAL | Status: DC
  Administered 2019-01-28: 20 mg via ORAL
  Filled 2019-01-27: qty 1

## 2019-01-27 MED ORDER — LOSARTAN POTASSIUM 50 MG PO TABS
100.0000 mg | ORAL_TABLET | Freq: Every day | ORAL | Status: DC
  Administered 2019-01-28: 100 mg via ORAL
  Filled 2019-01-27: qty 2

## 2019-01-27 MED ORDER — ACETAMINOPHEN 650 MG RE SUPP: 650.0000 mg | Freq: Four times a day (QID) | RECTAL | Status: DC | PRN

## 2019-01-27 MED ORDER — CITALOPRAM HYDROBROMIDE 20 MG PO TABS
40.0000 mg | ORAL_TABLET | Freq: Every day | ORAL | Status: DC
  Administered 2019-01-28: 40 mg via ORAL
  Filled 2019-01-27: qty 2

## 2019-01-27 MED ORDER — TRAZODONE HCL 50 MG PO TABS: 25.0000 mg | ORAL_TABLET | Freq: Every evening | ORAL | Status: DC | PRN

## 2019-01-27 MED ORDER — MAGNESIUM HYDROXIDE 400 MG/5ML PO SUSP
30.0000 mL | Freq: Every day | ORAL | Status: DC | PRN
  Administered 2019-01-28: 12:00:00 30 mL via ORAL
  Filled 2019-01-27: qty 30

## 2019-01-27 MED ORDER — VITAMIN E 180 MG (400 UNIT) PO CAPS
400.0000 [IU] | ORAL_CAPSULE | Freq: Every day | ORAL | Status: DC
  Administered 2019-01-28: 400 [IU] via ORAL
  Filled 2019-01-27: qty 1

## 2019-01-27 MED ORDER — PANTOPRAZOLE SODIUM 40 MG PO TBEC
40.0000 mg | DELAYED_RELEASE_TABLET | Freq: Two times a day (BID) | ORAL | Status: DC
  Administered 2019-01-27 – 2019-01-28 (×2): 40 mg via ORAL
  Filled 2019-01-27 (×2): qty 1

## 2019-01-27 MED ORDER — ASPIRIN EC 81 MG PO TBEC
81.0000 mg | DELAYED_RELEASE_TABLET | Freq: Every day | ORAL | Status: DC
  Administered 2019-01-28: 09:00:00 81 mg via ORAL
  Filled 2019-01-27: qty 1

## 2019-01-27 MED ORDER — ALLOPURINOL 100 MG PO TABS
100.0000 mg | ORAL_TABLET | Freq: Every day | ORAL | Status: DC
  Administered 2019-01-28: 100 mg via ORAL
  Filled 2019-01-27: qty 1

## 2019-01-27 MED ORDER — ONDANSETRON 4 MG PO TBDP
4.0000 mg | ORAL_TABLET | Freq: Three times a day (TID) | ORAL | Status: DC | PRN
  Filled 2019-01-27: qty 1

## 2019-01-27 MED ORDER — ENOXAPARIN SODIUM 40 MG/0.4ML ~~LOC~~ SOLN
40.0000 mg | SUBCUTANEOUS | Status: DC
  Administered 2019-01-28: 40 mg via SUBCUTANEOUS
  Filled 2019-01-27: qty 0.4

## 2019-01-27 MED ORDER — TIZANIDINE HCL 4 MG PO TABS
4.0000 mg | ORAL_TABLET | Freq: Three times a day (TID) | ORAL | Status: DC
  Administered 2019-01-27 – 2019-01-28 (×2): 4 mg via ORAL
  Filled 2019-01-27 (×5): qty 1

## 2019-01-27 MED ORDER — ONDANSETRON HCL 4 MG/2ML IJ SOLN: 4.0000 mg | Freq: Four times a day (QID) | INTRAMUSCULAR | Status: DC | PRN

## 2019-01-27 MED ORDER — VITAMIN B-12 1000 MCG PO TABS
1000.0000 ug | ORAL_TABLET | Freq: Every day | ORAL | Status: DC
  Administered 2019-01-28: 1000 ug via ORAL
  Filled 2019-01-27: qty 1

## 2019-01-27 MED ORDER — LEVOTHYROXINE SODIUM 50 MCG PO TABS
125.0000 ug | ORAL_TABLET | Freq: Every day | ORAL | Status: DC
  Administered 2019-01-28: 125 ug via ORAL
  Filled 2019-01-27: qty 3

## 2019-01-27 MED ORDER — AMLODIPINE BESYLATE 5 MG PO TABS
10.0000 mg | ORAL_TABLET | Freq: Every day | ORAL | Status: DC
  Administered 2019-01-28: 10 mg via ORAL
  Filled 2019-01-27: qty 2

## 2019-01-27 MED ORDER — ONDANSETRON HCL 4 MG PO TABS: 4.0000 mg | ORAL_TABLET | Freq: Four times a day (QID) | ORAL | Status: DC | PRN

## 2019-01-27 MED ORDER — LINACLOTIDE 290 MCG PO CAPS
290.0000 ug | ORAL_CAPSULE | Freq: Every day | ORAL | Status: DC | PRN
  Filled 2019-01-27: qty 1

## 2019-01-27 MED ORDER — ISOSORBIDE MONONITRATE ER 60 MG PO TB24
30.0000 mg | ORAL_TABLET | Freq: Every day | ORAL | Status: DC
  Administered 2019-01-28: 30 mg via ORAL
  Filled 2019-01-27: qty 1

## 2019-01-27 MED ORDER — OXYCODONE HCL 5 MG PO TABS
15.0000 mg | ORAL_TABLET | Freq: Once | ORAL | Status: AC
  Administered 2019-01-27: 15 mg via ORAL
  Filled 2019-01-27: qty 3

## 2019-01-27 MED ORDER — FLUTICASONE PROPIONATE 50 MCG/ACT NA SUSP
1.0000 | Freq: Every day | NASAL | Status: DC | PRN
  Filled 2019-01-27: qty 16

## 2019-01-27 MED ORDER — METHADONE HCL 5 MG PO TABS
5.0000 mg | ORAL_TABLET | Freq: Every day | ORAL | Status: DC
  Administered 2019-01-27: 5 mg via ORAL
  Filled 2019-01-27: qty 1

## 2019-01-27 MED ORDER — ACETAMINOPHEN 325 MG PO TABS: 650.0000 mg | ORAL_TABLET | Freq: Four times a day (QID) | ORAL | Status: DC | PRN

## 2019-01-27 MED ORDER — DEXAMETHASONE SODIUM PHOSPHATE 10 MG/ML IJ SOLN
10.0000 mg | Freq: Once | INTRAMUSCULAR | Status: AC
  Administered 2019-01-27: 10 mg via INTRAVENOUS
  Filled 2019-01-27: qty 1

## 2019-01-27 MED ORDER — GABAPENTIN 600 MG PO TABS
600.0000 mg | ORAL_TABLET | Freq: Four times a day (QID) | ORAL | Status: DC
  Administered 2019-01-27 – 2019-01-28 (×3): 600 mg via ORAL
  Filled 2019-01-27 (×4): qty 1

## 2019-01-27 MED ORDER — NALOXONE HCL 4 MG/0.1ML NA LIQD: 1.0000 | Freq: Once | NASAL | Status: DC

## 2019-01-27 NOTE — ED Notes (Signed)
Pt to the er to be evaluated post fall. Pt states he was waling this morning and his leg gave out and caused him to fall. Pt reports chronic pain in hip, back and lags. Pt has spinal stimulator but pt reports it doesn't work well. Pt reports new pain in the neck. Denies pain in the head. No lacerations noted.

## 2019-01-27 NOTE — ED Notes (Signed)
Pt reports pain is above his baseline. Pt attempting to rest. Night meds given except tizanidine.

## 2019-01-27 NOTE — Discharge Instructions (Signed)
I think your symptoms of weakness, confusion, and falls, are very likely due to multiple medications you are on that are very high risk for anyone over the age of 48.  For now, I would recommend the following: -STOP taking Benadryl - this is VERY risky for falls -STOP taking Tizanidine - this is VERY risk for falls -Ideally, I'd like you to start decreasing your GABAPENTIN as well  But for now, I'd say STOP the Benadryl and Tizanidine, and follow-up with your doctor next week.   IF your blood pressure is low again (<110 on the top), cut back your blood pressure medicine to your old dose and call your Cardiologist

## 2019-01-27 NOTE — H&P (Signed)
[facility] [place] NAME: Chase Mendez    MR#:  [id number]  DATE OF BIRTH:  12-11-1934  DATE OF ADMISSION:  01/27/2019  PRIMARY CARE PHYSICIAN: Jodi Marble, MD   REQUESTING/REFERRING PHYSICIAN: Duffy Bruce, MD  CHIEF COMPLAINT:   Chief Complaint  Patient presents with  . Fall  . Weakness  . Head Injury    HISTORY OF PRESENT ILLNESS:  Chase Mendez  is a 84 y.o. Caucasian male with a known history of multiple medical problems including chronic back pain, type 2 diabetes mellitus, hypertension and hypothyroidism, who presented to the emergency room with acute onset of recurrent falls x3 over the last 3 days.  He stated that his legs would give out.  He has been confused per his wife over the last couple of days and has been having visual hallucinations.  He has been on methadone in the past and was recently placed on oxycodone abrasions, Zanaflex and has been taking Benadryl for itching.[name]  He has not been having much appetite lately.  He feels so weak when he stands.  No dysuria, oliguria or hematuria or flank pain.  No chest pain or palpitations.  No nausea vomiting or diarrhea or abdominal pain.  No bleeding diathesis.  Upon presentation to the emergency room, blood pressure was 89/46, pulse oximetry 91% on room air with otherwise normal vital signs.  Urine drug screen came back negative.  CMP sugar BUN of 20 and creatinine 1.24 compared to 19 and 1.18 on 01/01/2019 and CBC was within normal.  His COVID-19 test is currently pending.  TSH was 1.4.  Two-view chest x-ray showed no acute cardiopulmonary disease.  EKG showed slightly accelerated junctional rhythm with with a rate of 59 and LAD and right bundle branch block.  Head CT scan revealed no acute intracranial normalities.  The patient was given 10 mg of IV Decadron and 50 mg of p.o. oxycodone in the ER as well as 1 L bolus of IV normal saline.  The patient continued to have visual hallucinations  seeing wasps in the ER.  He will be admitted to the medical monitored bed for further evaluation and management. PAST MEDICAL HISTORY:   Past Medical History:  Diagnosis Date  . Acute encephalopathy 12/08/2014  . Anxiety   . ARF (acute renal failure) ([place]) 12/08/2014  . Back pain   . Benign neoplasm of large bowel   . Capsulitis    fractured displaced metatarsal with capsulitis  . Chronic back pain   . Coronavirus infection   . Depression   . Diabetes mellitus without complication (HCC)    no medications currently  . Dysphagia   . Exostosis    painful, right hallux  . Foot drop, left    wears a brace  . Frequent falls 02/2018   poor balance, foot drop  . GERD (gastroesophageal reflux disease)   . Gout   . Hypertension   . Hypothyroidism   . Insomnia   . Low testosterone   . Microscopic hematuria 2016  . Myocardial infarction [facility])    patient unaware when it happened years ago.    . Pneumonia 12/07/2014  . Pressure ulcer 12/09/2014  . Sepsis ([place]) 12/08/2014  . Thyroid disease     PAST SURGICAL HISTORY:   Past Surgical History:  Procedure Laterality Date  . APPENDECTOMY    . [place]   x 2  . CHOLECYSTECTOMY    . ESOPHAGOGASTRODUODENOSCOPY (EGD)  WITH PROPOFOL N/A 04/24/2017   Procedure: ESOPHAGOGASTRODUODENOSCOPY (EGD) WITH PROPOFOL;  Surgeon: Manya Silvas, MD;  Location: [facility] ENDOSCOPY;  Service: Endoscopy;  Laterality: N/A;  . EYE SURGERY Bilateral 1983, 1985   cataract extractions  . SPINAL CORD STIMULATOR INSERTION N/A 03/13/2018   Procedure: SPINAL CORD STIMULATOR INSERTION;  Surgeon: Meade Maw, MD;  Location: [facility] ORS;  Service: Neurosurgery;  Laterality: N/A;    SOCIAL HISTORY:   Social History   Tobacco Use  . Smoking status: Current Every Day Smoker    Packs/day: 0.50    Years: 70.00    Pack years: 35.00    Types: Cigars  . Smokeless tobacco: Never Used  . Tobacco comment: unable to give cessation materials due to webex  visit   Substance Use Topics  . Alcohol use: No    FAMILY HISTORY:   Family History  Problem Relation Age of Onset  . Heart disease Mother   . Diabetes Father   . Kidney cancer Brother     DRUG ALLERGIES:   Allergies  Allergen Reactions  . Ibuprofen Nausea Only  . Sulfa Antibiotics Nausea Only    REVIEW OF SYSTEMS:   ROS As per history of present illness. All pertinent systems were reviewed above. Constitutional,  HEENT, cardiovascular, respiratory, GI, GU, musculoskeletal, neuro, psychiatric, endocrine,  integumentary and hematologic systems were reviewed and are otherwise  negative/unremarkable except for positive findings mentioned above in the HPI.   MEDICATIONS AT HOME:   Prior to Admission medications   Medication Sig Start Date End Date Taking? Authorizing Provider  allopurinol (ZYLOPRIM) 100 MG tablet Take 100 mg by mouth daily. 10/26/14  Yes [provider]  amLODipine (NORVASC) 10 MG tablet Take 10 mg by mouth daily.   Yes [provider]  aspirin 81 MG tablet Take 81 mg by mouth daily.   Yes [provider]  atorvastatin (LIPITOR) 20 MG tablet Take 20 mg by mouth daily.    Yes [provider]  citalopram (CELEXA) 40 MG tablet Take 40 mg by mouth daily.    Yes [provider]  fluticasone (FLONASE) 50 MCG/ACT nasal spray Place 1 spray into both nostrils daily as needed for rhinitis.    Yes [provider]  gabapentin (NEURONTIN) 600 MG tablet Take 1 tablet (600 mg total) by mouth every 6 (six) hours. 12/16/18 06/14/19 Yes Milinda Pointer, MD  isosorbide mononitrate (IMDUR) 30 MG 24 hr tablet Take 30 mg by mouth daily.   Yes [provider]  levothyroxine (SYNTHROID, LEVOTHROID) 125 MCG tablet Take 125 mcg by mouth daily before breakfast.    Yes [provider]  linaclotide (LINZESS) 290 MCG CAPS capsule Take 290 mcg by mouth daily as needed (for constipation).    Yes [provider]    losartan (COZAAR) 100 MG tablet Take 100 mg by mouth daily.    Yes [provider]  mirabegron ER (MYRBETRIQ) 25 MG TB24 tablet Take 25 mg by mouth daily.   Yes [provider]  ondansetron (ZOFRAN ODT) 4 MG disintegrating tablet Take 1 tablet (4 mg total) by mouth every 8 (eight) hours as needed for nausea or vomiting. 08/21/16  Yes Darel Hong, MD  oxyCODONE (ROXICODONE) 15 MG immediate release tablet Take 1 tablet (15 mg total) by mouth every 6 (six) hours as needed for pain. Must last 30 days 01/17/19 02/16/19 Yes Milinda Pointer, MD  pantoprazole (PROTONIX) 40 MG tablet Take 40 mg by mouth 2 (two) times daily. 02/04/18 01/27/19  Yes [provider]  Testosterone (ANDROGEL) 40.5 MG/2.5GM (1.62%) GEL Place 2 Pump onto the skin 3 (three) times a week.   Yes [provider]  vitamin B-12 (CYANOCOBALAMIN) 1000 MCG tablet Take 1,000 mcg by mouth daily.   Yes [provider]  vitamin E 400 UNIT capsule Take 400 Units by mouth daily.   Yes [provider]  tiZANidine (ZANAFLEX) 4 MG capsule Take 1 capsule (4 mg total) by mouth 3 (three) times daily. 12/16/18 01/27/19 Yes Milinda Pointer, MD  naloxone [facility]) nasal spray 4 mg/0.1 mL Spray into one nostril. Repeat with second device into other nostril after 2-3 minutes if no or minimal response. Use in case of opioid overdose. Patient taking differently: Place 1 spray into the nose once. Spray into one nostril. Repeat with second device into other nostril after 2-3 minutes if no or minimal response. Use in case of opioid overdose. 09/04/17   Vevelyn Francois, NP  oxyCODONE (ROXICODONE) 15 MG immediate release tablet Take 1 tablet (15 mg total) by mouth every 6 (six) hours as needed for pain. Must last 30 days 02/16/19 03/18/19  Milinda Pointer, MD  oxyCODONE (ROXICODONE) 15 MG immediate release tablet Take 1 tablet (15 mg total) by mouth every 6 (six) hours as needed for pain. Must last 30 days  03/18/19 04/17/19  Milinda Pointer, MD  diphenhydrAMINE (BENADRYL) 25 MG tablet Take 25 mg by mouth 2 (two) times daily.   01/27/19  [provider]      VITAL SIGNS:  Blood pressure (!) 118/93, pulse 76, temperature 98.9 F (37.2 C), temperature source Oral, resp. rate 18, height 6' (1.829 m), weight 89 kg, SpO2 94 %.  PHYSICAL EXAMINATION:  Physical Exam  GENERAL:  84 y.o.-year-old Caucasian male patient lying in the bed with no acute distress.  EYES: Pupils equal, round, reactive to light and accommodation. No scleral icterus. Extraocular muscles intact.  HEENT: Head atraumatic, normocephalic. Oropharynx and nasopharynx clear.  NECK:  Supple, no jugular venous distention. No thyroid enlargement, no tenderness.  LUNGS: Normal breath sounds bilaterally, no wheezing, rales,rhonchi or crepitation. No use of accessory muscles of respiration.  CARDIOVASCULAR: Regular rate and rhythm, S1, S2 normal. No murmurs, rubs, or gallops.  ABDOMEN: Soft, nondistended, nontender. Bowel sounds present. No organomegaly or mass.  EXTREMITIES: No pedal edema, cyanosis, or clubbing.  Left foot drop in brace. NEUROLOGIC: Cranial nerves II through XII are intact. Muscle strength 5/5 in all extremities. Sensation intact. Gait not checked.  PSYCHIATRIC: The patient is alert and oriented x 3.  Normal affect and good eye contact. SKIN: No obvious rash, lesion, or ulcer.   LABORATORY PANEL:   CBC Recent Labs  Lab 01/27/19 1533  WBC 6.8  HGB 13.5  HCT 40.4  PLT 152   ------------------------------------------------------------------------------------------------------------------  Chemistries  Recent Labs  Lab 01/27/19 1533  NA 138  K 4.2  CL 103  CO2 26  GLUCOSE 137*  BUN 20  CREATININE 1.24  CALCIUM 8.6*   ------------------------------------------------------------------------------------------------------------------  Cardiac Enzymes No results for input(s): TROPONINI in the  last 168 hours. ------------------------------------------------------------------------------------------------------------------  RADIOLOGY:  DG Chest 2 View  Result Date: 01/27/2019 CLINICAL DATA:  84 year old male with fall. EXAM: CHEST - 2 VIEW COMPARISON:  Chest radiograph dated 11/28/2018. FINDINGS: There is no focal consolidation, pleural effusion, or pneumothorax. The cardiac silhouette is within normal limits. Atherosclerotic calcification of the aortic arch. Midthoracic spinal stimulator. No acute osseous pathology. IMPRESSION: No active cardiopulmonary disease. Electronically Signed   By: Milas Hock  Radparvar M.D.   On: 01/27/2019 18:51   CT Head Wo Contrast  Result Date: 01/27/2019 CLINICAL DATA:  84 year old male status post multiple falls. Struck head today. Recently started on blood pressure medications. EXAM: CT HEAD WITHOUT CONTRAST TECHNIQUE: Contiguous axial images were obtained from the base of the skull through the vertex without intravenous contrast. COMPARISON:  Head CT 01/01/2019. FINDINGS: Brain: Stable cerebral volume. No midline shift, ventriculomegaly, mass effect, evidence of mass lesion, intracranial hemorrhage or evidence of cortically based acute infarction. Stable gray-white matter differentiation throughout the brain. Vascular: Calcified atherosclerosis at the skull base. No suspicious intracranial vascular hyperdensity. Skull: No fracture identified. Sinuses/Orbits: Visualized paranasal sinuses and mastoids are stable and well pneumatized. Other: No scalp hematoma identified. Orbits soft tissues appears stable. IMPRESSION: No acute intracranial abnormality or acute traumatic injury identified. Electronically Signed   By: Genevie Ann M.D.   On: 01/27/2019 16:18   CT Cervical Spine Wo Contrast  Result Date: 01/27/2019 CLINICAL DATA:  84 year old male status post multiple falls. Struck head today. Recently started on blood pressure medications. EXAM: CT CERVICAL SPINE  WITHOUT CONTRAST TECHNIQUE: Multidetector CT imaging of the cervical spine was performed without intravenous contrast. Multiplanar CT image reconstructions were also generated. COMPARISON:  Head CT today reported separately. Cervical spine CT 01/01/2019. FINDINGS: Alignment: Stable straightening of cervical lordosis. Cervicothoracic junction alignment is within normal limits. Bilateral posterior element alignment is within normal limits. Skull base and vertebrae: Visualized skull base is intact. No atlanto-occipital dissociation. No acute osseous abnormality identified. Soft tissues and spinal canal: No prevertebral fluid or swelling. No visible canal hematoma. Stable visible noncontrast neck soft tissues. Disc levels: Widespread advanced cervical spine degeneration appears stable, including at the anterior C1-C2 articulation. Upper chest: Visible upper thoracic levels appear intact. Negative lung apices. IMPRESSION: No acute traumatic injury identified in the cervical spine. Electronically Signed   By: Genevie Ann M.D.   On: 01/27/2019 16:21   DG HIP UNILAT WITH PELVIS 2-3 VIEWS LEFT  Result Date: 01/27/2019 CLINICAL DATA:  Fall EXAM: DG HIP (WITH OR WITHOUT PELVIS) 2-3V LEFT COMPARISON:  11/28/2018 FINDINGS: Alignment appears anatomic. There is no acute fracture identified. Joint space is preserved. Degenerative changes of the lower lumbar spine. Stimulator generator is identified overlying the left pelvis. IMPRESSION: No acute fracture. Electronically Signed   By: Macy Mis M.D.   On: 01/27/2019 18:50      IMPRESSION AND PLAN:   1.  Delirium likely secondary to polypharmacy with chronic back pain and radiculopathy.  I suspect a combination of opiate narcotics together with steroids and Benadryl with muscle relaxants.  The patient will be admitted to an observation medical monitored bed.  We will hold off sedatives and opiate narcotics as well as steroids and place the patient for now on small dose of  methadone which he has been taking.  We will continue Neurontin for now.  A neurology consultation will be obtained in a.m. I notified Dr. Irish Elders.  2.  Recurrent falls.  This is likely secondary to polypharmacy and subsequent side effects.  We will obtain physical therapy consultation to assess ambulation.  Management otherwise as above.  3.  Hypertension.  We will continue amlodipine and Cozaar.  4.  Hypothyroidism.  We will check TSH and continue Synthroid.  5.  Dyslipidemia.  We will continue statin therapy.  6.  Gout.  We will continue allopurinol.  7.  GERD.  We will continue PPI therapy.  8.  Depression.  We will continue  Celexa  9.  DVT prophylaxis.  Subcutaneous Lovenox.  All the records are reviewed and case discussed with ED provider. The plan of care was discussed in details with the patient (and family). I answered all questions. The patient agreed to proceed with the above mentioned plan. Further management will depend upon hospital course.   CODE STATUS: Full code  TOTAL TIME TAKING CARE OF THIS PATIENT: 55 minutes.    Christel Mormon M.D on 01/27/2019 at 10:12 PM  [facility]   From 7 PM-7 AM, contact night-coverage [email or web address]  CC: Primary care physician; Jodi Marble, MD   Note: This dictation was prepared with Dragon dictation along with smaller phrase technology. Any transcriptional errors that result from this process are unintentional.

## 2019-01-27 NOTE — ED Provider Notes (Addendum)
[facility] Emergency Department Provider Note  ____________________________________________   First MD Initiated Contact with Patient 01/27/19 1709     (approximate)  I have reviewed the triage vital signs and the nursing notes.   HISTORY  Chief Complaint Fall, Weakness, and Head Injury    HPI Chase Mendez is a 84 y.o. male with past medical history as below here with recurrent falls.  The patient has an extensive history of recurrent falls.  He has chronic back pain with spinal stimulator and chronic opiate use.  He states that last week, he saw his cardiologist and had his blood pressure medicines increased.  He also started new blood pressure medicine.  Since then, he has felt generally weak and lightheaded.  He has felt weak on his legs.  He has fallen 3 times in the last 3 days.  He states that when he is walking, he feels like his legs just give out.  Denies any unilateral focal numbness or weakness.  He does not have any uncontrollable shaking.  Denies any vision changes.  No chest pain or shortness of breath.  He then "crumpled" to the ground.  Denies any direct head injuries other than today, when he fell backwards into the wall.  No loss of consciousness today.  He was walking in his house when he fell today.  He states he has a home health PT, who has been working with him to help strengthen him, though this is limited by his pain.  He currently complains of left hip and leg pain, which is chronic, and he states is due to not taking his oxycodone while in the waiting room.        Past Medical History:  Diagnosis Date  . Acute encephalopathy 12/08/2014  . Anxiety   . ARF (acute renal failure) ([place]) 12/08/2014  . Back pain   . Benign neoplasm of large bowel   . Capsulitis    fractured displaced metatarsal with capsulitis  . Chronic back pain   . Coronavirus infection   . Depression   . Diabetes mellitus without complication (HCC)    no  medications currently  . Dysphagia   . Exostosis    painful, right hallux  . Foot drop, left    wears a brace  . Frequent falls 02/2018   poor balance, foot drop  . GERD (gastroesophageal reflux disease)   . Gout   . Hypertension   . Hypothyroidism   . Insomnia   . Low testosterone   . Microscopic hematuria 2016  . Myocardial infarction [facility])    patient unaware when it happened years ago.    . Pneumonia 12/07/2014  . Pressure ulcer 12/09/2014  . Sepsis ([place]) 12/08/2014  . Thyroid disease     Patient Active Problem List   Diagnosis Date Noted  . Delirium 01/27/2019  . Gout of left foot due to renal impairment 12/16/2018  . Presence of neurostimulator 10/22/2018  . Spinal cord stimulator dysfunction ([place]) 10/22/2018  . Encounter for interrogation of neurostimulator 10/22/2018  . Chronic musculoskeletal pain 09/11/2018  . Neurogenic pain 09/11/2018  . Claustrophobia 01/23/2018  . Thoracic central spinal stenosis 01/22/2018  . Cellulitis of right hand 12/06/2017  . Chronic pain syndrome 07/11/2017  . DDD (degenerative disc disease), lumbar 06/19/2017  . Opioid Drug tolerance 06/10/2017  . History of acute renal failure (12/08/2014) 06/10/2017  . History of encephalopathy (12/08/2014) 06/10/2017  . History of sepsis (12/08/2014) 06/10/2017  . Lower limb pain,  inferior (L5) (Foot Drop) (Left) 05/16/2017  . Chronic low back pain (Secondary Area of Pain) (Bilateral) (ML) (L>R) with sciatica (Left) 05/16/2017  . Chronic lower extremity pain (Primary Area of Pain) (Left) 05/16/2017  . 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 Foot drop (Left) 05/16/2017  . Lumbosacral radiculopathy at L5 (Left) 05/16/2017  . Disorder of skeletal system 05/16/2017  . Pharmacologic therapy 05/16/2017  . Problems influencing health status 05/16/2017  . Venous reflux 07/12/2016  . 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 Varicose veins of both lower extremities with pain 05/09/2016  . ARF (acute  renal failure) ([place]) 12/08/2014  . Pneumonia 12/07/2014    Past Surgical History:  Procedure Laterality Date  . APPENDECTOMY    . [place]   x 2  . CHOLECYSTECTOMY    . ESOPHAGOGASTRODUODENOSCOPY (EGD) WITH PROPOFOL N/A 04/24/2017   Procedure: ESOPHAGOGASTRODUODENOSCOPY (EGD) WITH PROPOFOL;  Surgeon: Manya Silvas, MD;  Location: [facility] ENDOSCOPY;  Service: Endoscopy;  Laterality: N/A;  . EYE SURGERY Bilateral 1983, 1985   cataract extractions  . SPINAL CORD STIMULATOR INSERTION N/A 03/13/2018   Procedure: SPINAL CORD STIMULATOR INSERTION;  Surgeon: Meade Maw, MD;  Location: [facility] ORS;  Service: Neurosurgery;  Laterality: N/A;    Prior to Admission medications   Medication Sig Start Date End Date Taking? Authorizing Provider  allopurinol (ZYLOPRIM) 100 MG tablet Take 100 mg by mouth daily. 10/26/14  Yes [provider]  amLODipine (NORVASC) 10 MG tablet Take 10 mg by mouth daily.   Yes [provider]  aspirin 81 MG tablet Take 81 mg by mouth daily.   Yes [provider]  atorvastatin (LIPITOR) 20 MG tablet Take 20 mg by mouth daily.    Yes [provider]  citalopram (CELEXA) 40 MG tablet Take 40 mg by mouth daily.    Yes [provider]  fluticasone (FLONASE) 50 MCG/ACT nasal spray Place 1 spray into both nostrils daily as needed for rhinitis.    Yes [provider]  gabapentin (NEURONTIN) 600 MG tablet Take 1 tablet (600 mg total) by mouth every 6 (six) hours. 12/16/18 06/14/19 Yes Milinda Pointer, MD  isosorbide mononitrate (IMDUR) 30 MG 24 hr tablet Take 30 mg by mouth daily.   Yes [provider]  levothyroxine (SYNTHROID, LEVOTHROID) 125 MCG tablet Take 125 mcg by mouth daily before breakfast.    Yes [provider]  linaclotide (LINZESS) 290 MCG CAPS capsule Take 290 mcg by mouth daily as needed (for constipation).    Yes [provider]  losartan (COZAAR) 100 MG tablet Take  100 mg by mouth daily.    Yes [provider]  mirabegron ER (MYRBETRIQ) 25 MG TB24 tablet Take 25 mg by mouth daily.   Yes [provider]  ondansetron (ZOFRAN ODT) 4 MG disintegrating tablet Take 1 tablet (4 mg total) by mouth every 8 (eight) hours as needed for nausea or vomiting. 08/21/16  Yes Darel Hong, MD  oxyCODONE (ROXICODONE) 15 MG immediate release tablet Take 1 tablet (15 mg total) by mouth every 6 (six) hours as needed for pain. Must last 30 days 01/17/19 02/16/19 Yes Milinda Pointer, MD  pantoprazole (PROTONIX) 40 MG tablet Take 40 mg by mouth 2 (two) times daily. 02/04/18 01/27/19 Yes [provider]  Testosterone (ANDROGEL) 40.5 MG/2.5GM (1.62%) GEL Place 2 Pump onto the skin 3 (three) times a week.   Yes [provider]  vitamin B-12 (CYANOCOBALAMIN) 1000 MCG tablet Take 1,000 mcg by  mouth daily.   Yes [provider]  vitamin E 400 UNIT capsule Take 400 Units by mouth daily.   Yes [provider]  tiZANidine (ZANAFLEX) 4 MG capsule Take 1 capsule (4 mg total) by mouth 3 (three) times daily. 12/16/18 01/27/19 Yes Milinda Pointer, MD  naloxone [facility]) nasal spray 4 mg/0.1 mL Spray into one nostril. Repeat with second device into other nostril after 2-3 minutes if no or minimal response. Use in case of opioid overdose. Patient taking differently: Place 1 spray into the nose once. Spray into one nostril. Repeat with second device into other nostril after 2-3 minutes if no or minimal response. Use in case of opioid overdose. 09/04/17   Vevelyn Francois, NP  oxyCODONE (ROXICODONE) 15 MG immediate release tablet Take 1 tablet (15 mg total) by mouth every 6 (six) hours as needed for pain. Must last 30 days 02/16/19 03/18/19  Milinda Pointer, MD  oxyCODONE (ROXICODONE) 15 MG immediate release tablet Take 1 tablet (15 mg total) by mouth every 6 (six) hours as needed for pain. Must last 30 days 03/18/19 04/17/19  Milinda Pointer, MD    diphenhydrAMINE (BENADRYL) 25 MG tablet Take 25 mg by mouth 2 (two) times daily.   01/27/19  [provider]    Allergies Ibuprofen and Sulfa antibiotics  Family History  Problem Relation Age of Onset  . Heart disease Mother   . Diabetes Father   . Kidney cancer Brother     Social History Social History   Tobacco Use  . Smoking status: Current Every Day Smoker    Packs/day: 0.50    Years: 70.00    Pack years: 35.00    Types: Cigars  . Smokeless tobacco: Never Used  . Tobacco comment: unable to give cessation materials due to webex visit   Substance Use Topics  . Alcohol use: No  . Drug use: Yes    Types: Oxycodone    Review of Systems  Review of Systems  Constitutional: Positive for fatigue. Negative for chills and fever.  HENT: Negative for sore throat.   Respiratory: Negative for shortness of breath.   Cardiovascular: Negative for chest pain.  Gastrointestinal: Negative for abdominal pain.  Genitourinary: Negative for flank pain.  Musculoskeletal: Positive for arthralgias, gait problem and myalgias. Negative for neck pain.  Skin: Negative for rash and wound.  Allergic/Immunologic: Negative for immunocompromised state.  Neurological: Negative for weakness and numbness.  Hematological: Does not bruise/bleed easily.  All other systems reviewed and are negative.    ____________________________________________  PHYSICAL EXAM:      VITAL SIGNS: ED Triage Vitals  Enc Vitals Group     BP 01/27/19 1529 (!) 89/46     Pulse Rate 01/27/19 1529 60     Resp 01/27/19 1529 20     Temp 01/27/19 1529 98.9 F (37.2 C)     Temp Source 01/27/19 1529 Oral     SpO2 01/27/19 1529 91 %     Weight 01/27/19 1530 196 lb 3.4 oz (89 kg)     Height 01/27/19 1530 6' (1.829 m)     Head Circumference --      Peak Flow --      Pain Score 01/27/19 1530 2     Pain Loc --      Pain Edu? --      Excl. in [place]? --      Physical Exam Vitals and nursing note reviewed.   Constitutional:      General: He  is not in acute distress.    Appearance: He is well-developed.  HENT:     Head: Normocephalic and atraumatic.     Comments: Superficial abrasion over occiput. No large hematoma. No hemotypmanum.     Mouth/Throat:     Mouth: Mucous membranes are dry.  Eyes:     Conjunctiva/sclera: Conjunctivae normal.  Cardiovascular:     Rate and Rhythm: Normal rate and regular rhythm.     Heart sounds: Normal heart sounds. No murmur. No friction rub.  Pulmonary:     Effort: Pulmonary effort is normal. No respiratory distress.     Breath sounds: Normal breath sounds. No wheezing or rales.  Abdominal:     General: There is no distension.     Palpations: Abdomen is soft.     Tenderness: There is no abdominal tenderness.  Musculoskeletal:     Cervical back: Neck supple.  Skin:    General: Skin is warm.     Capillary Refill: Capillary refill takes less than 2 seconds.  Neurological:     Mental Status: He is alert and oriented to person, place, and time.     Motor: No abnormal muscle tone.       ____________________________________________   LABS (all labs ordered are listed, but only abnormal results are displayed)  Labs Reviewed  BASIC METABOLIC PANEL - Abnormal; Notable for the following components:      Result Value   Glucose, Bld 137 (*)    Calcium 8.6 (*)    GFR calc non Af Amer 54 (*)    All other components within normal limits  URINALYSIS, COMPLETE (UACMP) WITH MICROSCOPIC - Abnormal; Notable for the following components:   Color, Urine YELLOW (*)    APPearance CLEAR (*)    All other components within normal limits  SARS CORONAVIRUS 2 (TAT 6-24 HRS)  CBC  BASIC METABOLIC PANEL  CBC  TSH    ____________________________________________  EKG: Ventricular rate 59, QRS 122, QTc 469.  Baseline artifact due to spinal stimulator, but no overt ST elevations or depressions. ________________________________________  RADIOLOGY All imaging,  including plain films, CT scans, and ultrasounds, independently reviewed by me, and interpretations confirmed via formal radiology reads.  ED MD interpretation:   CT head: No acute abnormality CT cervical spine: No acute abnormality  Official radiology report(s): DG Chest 2 View  Result Date: 01/27/2019 CLINICAL DATA:  84 year old male with fall. EXAM: CHEST - 2 VIEW COMPARISON:  Chest radiograph dated 11/28/2018. FINDINGS: There is no focal consolidation, pleural effusion, or pneumothorax. The cardiac silhouette is within normal limits. Atherosclerotic calcification of the aortic arch. Midthoracic spinal stimulator. No acute osseous pathology. IMPRESSION: No active cardiopulmonary disease. Electronically Signed   By: Anner Crete M.D.   On: 01/27/2019 18:51   CT Head Wo Contrast  Result Date: 01/27/2019 CLINICAL DATA:  84 year old male status post multiple falls. Struck head today. Recently started on blood pressure medications. EXAM: CT HEAD WITHOUT CONTRAST TECHNIQUE: Contiguous axial images were obtained from the base of the skull through the vertex without intravenous contrast. COMPARISON:  Head CT 01/01/2019. FINDINGS: Brain: Stable cerebral volume. No midline shift, ventriculomegaly, mass effect, evidence of mass lesion, intracranial hemorrhage or evidence of cortically based acute infarction. Stable gray-white matter differentiation throughout the brain. Vascular: Calcified atherosclerosis at the skull base. No suspicious intracranial vascular hyperdensity. Skull: No fracture identified. Sinuses/Orbits: Visualized paranasal sinuses and mastoids are stable and well pneumatized. Other: No scalp hematoma identified. Orbits soft tissues appears stable. IMPRESSION: No acute  intracranial abnormality or acute traumatic injury identified. Electronically Signed   By: Genevie Ann M.D.   On: 01/27/2019 16:18   CT Cervical Spine Wo Contrast  Result Date: 01/27/2019 CLINICAL DATA:  84 year old male  status post multiple falls. Struck head today. Recently started on blood pressure medications. EXAM: CT CERVICAL SPINE WITHOUT CONTRAST TECHNIQUE: Multidetector CT imaging of the cervical spine was performed without intravenous contrast. Multiplanar CT image reconstructions were also generated. COMPARISON:  Head CT today reported separately. Cervical spine CT 01/01/2019. FINDINGS: Alignment: Stable straightening of cervical lordosis. Cervicothoracic junction alignment is within normal limits. Bilateral posterior element alignment is within normal limits. Skull base and vertebrae: Visualized skull base is intact. No atlanto-occipital dissociation. No acute osseous abnormality identified. Soft tissues and spinal canal: No prevertebral fluid or swelling. No visible canal hematoma. Stable visible noncontrast neck soft tissues. Disc levels: Widespread advanced cervical spine degeneration appears stable, including at the anterior C1-C2 articulation. Upper chest: Visible upper thoracic levels appear intact. Negative lung apices. IMPRESSION: No acute traumatic injury identified in the cervical spine. Electronically Signed   By: Genevie Ann M.D.   On: 01/27/2019 16:21   DG HIP UNILAT WITH PELVIS 2-3 VIEWS LEFT  Result Date: 01/27/2019 CLINICAL DATA:  Fall EXAM: DG HIP (WITH OR WITHOUT PELVIS) 2-3V LEFT COMPARISON:  11/28/2018 FINDINGS: Alignment appears anatomic. There is no acute fracture identified. Joint space is preserved. Degenerative changes of the lower lumbar spine. Stimulator generator is identified overlying the left pelvis. IMPRESSION: No acute fracture. Electronically Signed   By: Macy Mis M.D.   On: 01/27/2019 18:50    ____________________________________________  PROCEDURES   Procedure(s) performed (including Critical Care):  Procedures  ____________________________________________  INITIAL IMPRESSION / MDM / [place] / ED COURSE  As part of my medical decision making, I  reviewed the following data within the [place] notes reviewed and incorporated, Old chart reviewed, Notes from prior ED visits, and [place] Controlled Substance Database       *WILLIM BREGMAN was evaluated in Emergency Department on 01/27/2019 for the symptoms described in the history of present illness. He was evaluated in the context of the global COVID-19 pandemic, which necessitated consideration that the patient might be at risk for infection with the SARS-CoV-2 virus that causes COVID-19. Institutional protocols and algorithms that pertain to the evaluation of patients at risk for COVID-19 are in a state of rapid change based on information released by regulatory bodies including the CDC and federal and state organizations. These policies and algorithms were followed during the patient's care in the ED.  Some ED evaluations and interventions may be delayed as a result of limited staffing during the pandemic.*     Medical Decision Making:  84 yo M with PMHx as above here w/ fall from legs giving out. He has a long, well documented h/o same and has been advised to remain in wheelchair by his PCP. I have a strong suspicion much of this is 2/2 polypharmacy - pt is on high dose oxycodone, zanaflex, benadryl, and additionally just had BP medications increased, all of which are likely contributing to his leg weakness, orthostasis, and falls.  On my assessment, pt borderline tachycardic, hypertensive. He has not had his opioids today. I suspect some of his agitation is from opioid w/d given his long, well-documented dependence - will give his home dose of PO oxy. Otherwise, he has some chronic lumbar radiculopathy that I suspect is contributing to his pain  and is why he is on gabapentin, zanaflex, and the benadryl to combat the pruritis from the oxy. Will give a trial dose of decadron as he's responded well to this in the past with no steroid-induced mood changes, in the hope that he  can reduce his dependence on zanaflex and gabapentin.  Given his altered LOC, weakness, falls, will admit for management of his polypharmacy. Would be very cautious cutting back his opioids given his extensive h/o opioid dependence, and I suspect his anticholinergic, anithistamine, and neuroleptic medications are larger culprits in his presentation of weakness, occasional visual hallucinations. He is not bradypneic, hypoxic, hypotensive, bradycardic, and has normal to dilated pupils b/l after oxy 15 - do not suspect this is primarily opioid, and he notes that his falls, weakness started after he was swapped from methadone to oxy, with addition of his Zanaflex and Gabapentin. Also do not feel this is related to steroids as these were just given <2 hr ago, which mechanistically would not fit with the timecourse of his illness as his hallucinations, falls and weakness has been ongoing x weeks at home.   ____________________________________________  FINAL CLINICAL IMPRESSION(S) / ED DIAGNOSES  Final diagnoses:  Dehydration  Polypharmacy     MEDICATIONS GIVEN DURING THIS VISIT:  Medications  allopurinol (ZYLOPRIM) tablet 100 mg (has no administration in time range)  aspirin tablet 81 mg (has no administration in time range)  amLODipine (NORVASC) tablet 10 mg (has no administration in time range)  atorvastatin (LIPITOR) tablet 20 mg (has no administration in time range)  isosorbide mononitrate (IMDUR) 24 hr tablet 30 mg (has no administration in time range)  losartan (COZAAR) tablet 100 mg (has no administration in time range)  citalopram (CELEXA) tablet 40 mg (has no administration in time range)  levothyroxine (SYNTHROID) tablet 125 mcg (has no administration in time range)  Testosterone 40.5 MG/2.5GM (1.62%) GEL 2 Pump (has no administration in time range)  linaclotide (LINZESS) capsule 290 mcg (has no administration in time range)  ondansetron (ZOFRAN-ODT) disintegrating tablet 4 mg (has no  administration in time range)  pantoprazole (PROTONIX) EC tablet 40 mg (has no administration in time range)  vitamin B-12 (CYANOCOBALAMIN) tablet 1,000 mcg (has no administration in time range)  naloxone (NARCAN) nasal spray 4 mg/0.1 mL (has no administration in time range)  gabapentin (NEURONTIN) tablet 600 mg (has no administration in time range)  tiZANidine (ZANAFLEX) capsule 4 mg (has no administration in time range)  vitamin E capsule 400 Units (has no administration in time range)  fluticasone (FLONASE) 50 MCG/ACT nasal spray 1 spray (has no administration in time range)  enoxaparin (LOVENOX) injection 40 mg (has no administration in time range)  0.9 %  sodium chloride infusion (has no administration in time range)  acetaminophen (TYLENOL) tablet 650 mg (has no administration in time range)    Or  acetaminophen (TYLENOL) suppository 650 mg (has no administration in time range)  traZODone (DESYREL) tablet 25 mg (has no administration in time range)  magnesium hydroxide (MILK OF MAGNESIA) suspension 30 mL (has no administration in time range)  ondansetron (ZOFRAN) tablet 4 mg (has no administration in time range)    Or  ondansetron (ZOFRAN) injection 4 mg (has no administration in time range)  sodium chloride 0.9 % bolus 1,000 mL (0 mLs Intravenous Stopped 01/27/19 1946)  oxyCODONE (Oxy IR/ROXICODONE) immediate release tablet 15 mg (15 mg Oral Given 01/27/19 1810)  dexamethasone (DECADRON) injection 10 mg (10 mg Intravenous Given 01/27/19 2019)  ED Discharge Orders    None       Note:  This document was prepared using Dragon voice recognition software and may include unintentional dictation errors.   Duffy Bruce, MD 01/27/19 Otila Back    Duffy Bruce, MD 01/27/19 Zella Ball    Duffy Bruce, MD 01/27/19 2227

## 2019-01-27 NOTE — ED Notes (Signed)
Pt unable to stand for the entire time for orthostatic vitals check. Pt sat down back on the bed. No fall.

## 2019-01-27 NOTE — ED Triage Notes (Addendum)
To ER due to multiple falls. Pt reports that he fell today and hit head, denies LOC. No swelling to head present upon assessment, small vertical abrasion.  Pt was recently started on BP medications. Pt alert and oriented X4, cooperative, RR even and unlabored, color WNL. Pt in NAD.

## 2019-01-27 NOTE — ED Notes (Signed)
Spoke with wife. Pt refuses wheelchair.

## 2019-01-28 ENCOUNTER — Observation Stay: Payer: Medicare Other

## 2019-01-28 ENCOUNTER — Telehealth: Payer: Self-pay

## 2019-01-28 DIAGNOSIS — Z79899 Other long term (current) drug therapy: Secondary | ICD-10-CM | POA: Diagnosis not present

## 2019-01-28 DIAGNOSIS — R410 Disorientation, unspecified: Secondary | ICD-10-CM | POA: Diagnosis not present

## 2019-01-28 LAB — CBC
HCT: 42.8 % (ref 39.0–52.0)
Hemoglobin: 14.6 g/dL (ref 13.0–17.0)
MCH: 30 pg (ref 26.0–34.0)
MCHC: 34.1 g/dL (ref 30.0–36.0)
MCV: 87.9 fL (ref 80.0–100.0)
Platelets: 141 10*3/uL — ABNORMAL LOW (ref 150–400)
RBC: 4.87 MIL/uL (ref 4.22–5.81)
RDW: 12.6 % (ref 11.5–15.5)
WBC: 4.4 10*3/uL (ref 4.0–10.5)
nRBC: 0 % (ref 0.0–0.2)

## 2019-01-28 LAB — BASIC METABOLIC PANEL
Anion gap: 10 (ref 5–15)
BUN: 17 mg/dL (ref 8–23)
CO2: 25 mmol/L (ref 22–32)
Calcium: 8.9 mg/dL (ref 8.9–10.3)
Chloride: 105 mmol/L (ref 98–111)
Creatinine, Ser: 0.9 mg/dL (ref 0.61–1.24)
GFR calc Af Amer: >60 mL/min (ref >60)
GFR calc non Af Amer: >60 mL/min (ref >60)
Glucose, Bld: 189 mg/dL — ABNORMAL HIGH (ref 70–99)
Potassium: 4 mmol/L (ref 3.5–5.1)
Sodium: 140 mmol/L (ref 135–145)

## 2019-01-28 LAB — SARS CORONAVIRUS 2 (TAT 6-24 HRS): SARS Coronavirus 2: NEGATIVE

## 2019-01-28 LAB — VITAMIN B12: Vitamin B-12: 1233 pg/mL — ABNORMAL HIGH (ref 180–914)

## 2019-01-28 MED ORDER — GABAPENTIN 600 MG PO TABS: 600.0000 mg | ORAL_TABLET | Freq: Three times a day (TID) | ORAL | 5 refills | Status: DC

## 2019-01-28 MED ORDER — OXYCODONE HCL 5 MG PO TABS: 15.0000 mg | ORAL_TABLET | Freq: Four times a day (QID) | ORAL | Status: DC | PRN

## 2019-01-28 MED ORDER — TIZANIDINE HCL 4 MG PO TABS
4.0000 mg | ORAL_TABLET | Freq: Three times a day (TID) | ORAL | Status: DC | PRN
  Filled 2019-01-28: qty 1

## 2019-01-28 MED ORDER — GABAPENTIN 600 MG PO TABS
600.0000 mg | ORAL_TABLET | Freq: Three times a day (TID) | ORAL | Status: DC
  Administered 2019-01-28: 16:00:00 600 mg via ORAL
  Filled 2019-01-28 (×2): qty 1

## 2019-01-28 MED ORDER — MIRABEGRON ER 25 MG PO TB24
25.0000 mg | ORAL_TABLET | Freq: Every day | ORAL | Status: DC
  Administered 2019-01-28: 25 mg via ORAL
  Filled 2019-01-28: qty 1

## 2019-01-28 NOTE — Telephone Encounter (Signed)
From [place]. Patient wanted you to know this information.

## 2019-01-28 NOTE — ED Notes (Signed)
Pt has spilled his urinal in the bed. Pt able to stand and pivot to the chair so this rn can clean the bed. Pt given a gown and all wet clothing bagged up.

## 2019-01-28 NOTE — ED Notes (Signed)
ED TO INPATIENT HANDOFF REPORT  ED Nurse Name and Phone #:  Roland  S Name/Age/Gender Chase Mendez 84 y.o. male Room/Bed: ED07A/ED07A  Code Status   Code Status: Full Code  Home/SNF/Other Home Patient oriented to: self, place, time and situation Is this baseline? Yes   Triage Complete: Triage complete  Chief Complaint Delirium [R41.0]  Triage Note To ER due to multiple falls. Pt reports that he fell today and hit head, denies LOC. No swelling to head present upon assessment, small vertical abrasion.  Pt was recently started on BP medications. Pt alert and oriented X4, cooperative, RR even and unlabored, color WNL. Pt in NAD.     Allergies Allergies  Allergen Reactions  . Ibuprofen Nausea Only  . Sulfa Antibiotics Nausea Only    Level of Care/Admitting Diagnosis ED Disposition    ED Disposition Condition [facility] Area: [place] [[id number]]  Level of Care: Med-Surg [16]  Covid Evaluation: Asymptomatic Screening Protocol (No Symptoms)  Diagnosis: Delirium [id number]  Admitting Physician: Christel Mormon [id number]  Attending Physician: Christel Mormon [id number]       B Medical/Surgery History Past Medical History:  Diagnosis Date  . Acute encephalopathy 12/08/2014  . Anxiety   . ARF (acute renal failure) ([place]) 12/08/2014  . Back pain   . Benign neoplasm of large bowel   . Capsulitis    fractured displaced metatarsal with capsulitis  . Chronic back pain   . Coronavirus infection   . Depression   . Diabetes mellitus without complication (HCC)    no medications currently  . Dysphagia   . Exostosis    painful, right hallux  . Foot drop, left    wears a brace  . Frequent falls 02/2018   poor balance, foot drop  . GERD (gastroesophageal reflux disease)   . Gout   . Hypertension   . Hypothyroidism   . Insomnia   . Low testosterone   . Microscopic hematuria 2016  . Myocardial infarction [facility])    patient unaware  when it happened years ago.    . Pneumonia 12/07/2014  . Pressure ulcer 12/09/2014  . Sepsis ([place]) 12/08/2014  . Thyroid disease    Past Surgical History:  Procedure Laterality Date  . APPENDECTOMY    . [place]   x 2  . CHOLECYSTECTOMY    . ESOPHAGOGASTRODUODENOSCOPY (EGD) WITH PROPOFOL N/A 04/24/2017   Procedure: ESOPHAGOGASTRODUODENOSCOPY (EGD) WITH PROPOFOL;  Surgeon: Manya Silvas, MD;  Location: [facility] ENDOSCOPY;  Service: Endoscopy;  Laterality: N/A;  . EYE SURGERY Bilateral 1983, 1985   cataract extractions  . SPINAL CORD STIMULATOR INSERTION N/A 03/13/2018   Procedure: SPINAL CORD STIMULATOR INSERTION;  Surgeon: Meade Maw, MD;  Location: [facility] ORS;  Service: Neurosurgery;  Laterality: N/A;     A IV Location/Drains/Wounds Patient Lines/Drains/Airways Status   Active Line/Drains/Airways    Name:   Placement date:   Placement time:   Site:   Days:   Peripheral IV 01/27/19 Left Antecubital   01/27/19    1539    Antecubital   1   Sheath 12/24/18   12/24/18    0900    --   35   Airway 7.5 mm   03/13/18    0830     321   Incision (Closed) 03/13/18 Back   03/13/18    0838     321   Pressure Ulcer 12/08/14 Stage II -  Partial  thickness loss of dermis presenting as a shallow open ulcer with a red, pink wound bed without slough.   12/08/14    0100     1512          Intake/Output Last 24 hours No intake or output data in the 24 hours ending 01/28/19 0111  Labs/Imaging Results for orders placed or performed during the hospital encounter of 01/27/19 (from the past 48 hour(s))  Basic metabolic panel     Status: Abnormal   Collection Time: 01/27/19  3:33 PM  Result Value Ref Range   Sodium 138 135 - 145 mmol/L   Potassium 4.2 3.5 - 5.1 mmol/L   Chloride 103 98 - 111 mmol/L   CO2 26 22 - 32 mmol/L   Glucose, Bld 137 (H) 70 - 99 mg/dL   BUN 20 8 - 23 mg/dL   Creatinine, Ser 1.24 0.61 - 1.24 mg/dL   Calcium 8.6 (L) 8.9 - 10.3 mg/dL   GFR calc non Af Amer  54 (L) >60 mL/min   GFR calc Af Amer >60 >60 mL/min   Anion gap 9 5 - 15    Comment: Performed at [facility], [address] [place], [place] [address]  CBC     Status: None   Collection Time: 01/27/19  3:33 PM  Result Value Ref Range   WBC 6.8 4.0 - 10.5 K/uL   RBC 4.50 4.22 - 5.81 MIL/uL   Hemoglobin 13.5 13.0 - 17.0 g/dL   HCT 40.4 39.0 - 52.0 %   MCV 89.8 80.0 - 100.0 fL   MCH 30.0 26.0 - 34.0 pg   MCHC 33.4 30.0 - 36.0 g/dL   RDW 13.0 11.5 - 15.5 %   Platelets 152 150 - 400 K/uL   nRBC 0.0 0.0 - 0.2 %    Comment: Performed at [facility], [address] [place], [place] [address]  TSH     Status: None   Collection Time: 01/27/19  3:33 PM  Result Value Ref Range   TSH 1.425 0.350 - 4.500 uIU/mL    Comment: Performed by a 3rd Generation assay with a functional sensitivity of <=0.01 uIU/mL. Performed at [facility], [address] [place], [place] [address]   Urinalysis, Complete w Microscopic     Status: Abnormal   Collection Time: 01/27/19  6:17 PM  Result Value Ref Range   Color, Urine YELLOW (A) YELLOW   APPearance CLEAR (A) CLEAR   Specific Gravity, Urine 1.016 1.005 - 1.030   pH 5.0 5.0 - 8.0   Glucose, UA NEGATIVE NEGATIVE mg/dL   Hgb urine dipstick NEGATIVE NEGATIVE   Bilirubin Urine NEGATIVE NEGATIVE   Ketones, ur NEGATIVE NEGATIVE mg/dL   Protein, ur NEGATIVE NEGATIVE mg/dL   Nitrite NEGATIVE NEGATIVE   Leukocytes,Ua NEGATIVE NEGATIVE   WBC, UA 0-5 0 - 5 WBC/hpf   Bacteria, UA NONE SEEN NONE SEEN   Squamous Epithelial / LPF 0-5 0 - 5   Mucus PRESENT    Hyaline Casts, UA PRESENT     Comment: Performed at [facility], [address] [place], [place] [address]   DG Chest 2 View  Result Date: 01/27/2019 CLINICAL DATA:  84 year old male with fall. EXAM: CHEST - 2 VIEW COMPARISON:  Chest radiograph dated 11/28/2018. FINDINGS: There is no focal consolidation, pleural effusion, or pneumothorax. The cardiac  silhouette is within normal limits. Atherosclerotic calcification of the aortic arch. Midthoracic spinal stimulator. No acute osseous pathology. IMPRESSION: No active  cardiopulmonary disease. Electronically Signed   By: Anner Crete M.D.   On: 01/27/2019 18:51   CT Head Wo Contrast  Result Date: 01/27/2019 CLINICAL DATA:  84 year old male status post multiple falls. Struck head today. Recently started on blood pressure medications. EXAM: CT HEAD WITHOUT CONTRAST TECHNIQUE: Contiguous axial images were obtained from the base of the skull through the vertex without intravenous contrast. COMPARISON:  Head CT 01/01/2019. FINDINGS: Brain: Stable cerebral volume. No midline shift, ventriculomegaly, mass effect, evidence of mass lesion, intracranial hemorrhage or evidence of cortically based acute infarction. Stable gray-white matter differentiation throughout the brain. Vascular: Calcified atherosclerosis at the skull base. No suspicious intracranial vascular hyperdensity. Skull: No fracture identified. Sinuses/Orbits: Visualized paranasal sinuses and mastoids are stable and well pneumatized. Other: No scalp hematoma identified. Orbits soft tissues appears stable. IMPRESSION: No acute intracranial abnormality or acute traumatic injury identified. Electronically Signed   By: Genevie Ann M.D.   On: 01/27/2019 16:18   CT Cervical Spine Wo Contrast  Result Date: 01/27/2019 CLINICAL DATA:  84 year old male status post multiple falls. Struck head today. Recently started on blood pressure medications. EXAM: CT CERVICAL SPINE WITHOUT CONTRAST TECHNIQUE: Multidetector CT imaging of the cervical spine was performed without intravenous contrast. Multiplanar CT image reconstructions were also generated. COMPARISON:  Head CT today reported separately. Cervical spine CT 01/01/2019. FINDINGS: Alignment: Stable straightening of cervical lordosis. Cervicothoracic junction alignment is within normal limits. Bilateral posterior  element alignment is within normal limits. Skull base and vertebrae: Visualized skull base is intact. No atlanto-occipital dissociation. No acute osseous abnormality identified. Soft tissues and spinal canal: No prevertebral fluid or swelling. No visible canal hematoma. Stable visible noncontrast neck soft tissues. Disc levels: Widespread advanced cervical spine degeneration appears stable, including at the anterior C1-C2 articulation. Upper chest: Visible upper thoracic levels appear intact. Negative lung apices. IMPRESSION: No acute traumatic injury identified in the cervical spine. Electronically Signed   By: Genevie Ann M.D.   On: 01/27/2019 16:21   DG HIP UNILAT WITH PELVIS 2-3 VIEWS LEFT  Result Date: 01/27/2019 CLINICAL DATA:  Fall EXAM: DG HIP (WITH OR WITHOUT PELVIS) 2-3V LEFT COMPARISON:  11/28/2018 FINDINGS: Alignment appears anatomic. There is no acute fracture identified. Joint space is preserved. Degenerative changes of the lower lumbar spine. Stimulator generator is identified overlying the left pelvis. IMPRESSION: No acute fracture. Electronically Signed   By: Macy Mis M.D.   On: 01/27/2019 18:50    Pending Labs Unresulted Labs (From admission, onward)    Start     Ordered   01/28/19 [id number]  Basic metabolic panel  Tomorrow morning,   STAT     01/27/19 2209   01/28/19 0500  CBC  Tomorrow morning,   STAT     01/27/19 2209   01/27/19 2156  SARS CORONAVIRUS 2 (TAT 6-24 HRS) Nasopharyngeal Nasopharyngeal Swab  (Tier 3 (TAT 6-24 hrs))  Once,   STAT    Question Answer Comment  Is this test for diagnosis or screening Screening   Symptomatic for COVID-19 as defined by CDC No   Hospitalized for COVID-19 No   Admitted to ICU for COVID-19 No   Previously tested for COVID-19 Yes   Resident in a congregate (group) care setting No   Employed in healthcare setting No      01/27/19 2155          Vitals/Pain Today's Vitals   01/27/19 2300 01/28/19 0000 01/28/19 0030 01/28/19 0047  BP:  (!) 144/63 (!) 131/57 (!) 117/94  Pulse: 82 81 76   Resp: 18     Temp:      TempSrc:      SpO2: 96% 92% 93%   Weight:      Height:      PainSc:    Asleep    Isolation Precautions No active isolations  Medications Medications  allopurinol (ZYLOPRIM) tablet 100 mg (has no administration in time range)  aspirin EC tablet 81 mg (has no administration in time range)  amLODipine (NORVASC) tablet 10 mg (has no administration in time range)  atorvastatin (LIPITOR) tablet 20 mg (has no administration in time range)  isosorbide mononitrate (IMDUR) 24 hr tablet 30 mg (has no administration in time range)  losartan (COZAAR) tablet 100 mg (has no administration in time range)  citalopram (CELEXA) tablet 40 mg (has no administration in time range)  levothyroxine (SYNTHROID) tablet 125 mcg (has no administration in time range)  Testosterone 40.5 MG/2.5GM (1.62%) GEL 2 Pump (has no administration in time range)  linaclotide (LINZESS) capsule 290 mcg (has no administration in time range)  ondansetron (ZOFRAN-ODT) disintegrating tablet 4 mg (has no administration in time range)  pantoprazole (PROTONIX) EC tablet 40 mg (40 mg Oral Given 01/27/19 2305)  vitamin B-12 (CYANOCOBALAMIN) tablet 1,000 mcg (has no administration in time range)  naloxone (NARCAN) nasal spray 4 mg/0.1 mL (0 sprays Nasal Hold 01/27/19 2311)  gabapentin (NEURONTIN) tablet 600 mg (600 mg Oral Given 01/27/19 2304)  tiZANidine (ZANAFLEX) tablet 4 mg (4 mg Oral Given 01/27/19 2338)  vitamin E capsule 400 Units (has no administration in time range)  fluticasone (FLONASE) 50 MCG/ACT nasal spray 1 spray (has no administration in time range)  enoxaparin (LOVENOX) injection 40 mg (has no administration in time range)  0.9 %  sodium chloride infusion ( Intravenous New Bag/Given 01/27/19 2304)  acetaminophen (TYLENOL) tablet 650 mg (has no administration in time range)    Or  acetaminophen (TYLENOL) suppository 650 mg (has no  administration in time range)  traZODone (DESYREL) tablet 25 mg (has no administration in time range)  magnesium hydroxide (MILK OF MAGNESIA) suspension 30 mL (has no administration in time range)  ondansetron (ZOFRAN) tablet 4 mg (has no administration in time range)    Or  ondansetron (ZOFRAN) injection 4 mg (has no administration in time range)  methadone (DOLOPHINE) tablet 5 mg (5 mg Oral Given 01/27/19 2305)  sodium chloride 0.9 % bolus 1,000 mL (0 mLs Intravenous Stopped 01/27/19 1946)  oxyCODONE (Oxy IR/ROXICODONE) immediate release tablet 15 mg (15 mg Oral Given 01/27/19 1810)  dexamethasone (DECADRON) injection 10 mg (10 mg Intravenous Given 01/27/19 2019)    Mobility Pt walks with a walker but per his pcp, pt needs to be in a wheelchair Low fall risk   Focused Assessments n/a   R Recommendations: See Admitting Provider Note  Report given to:   Additional Notes:

## 2019-01-28 NOTE — Consult Note (Signed)
Reason for Consult:difficulty walking/halucinations  Referring Physician: Dr. Posey Pronto  CC: difficulty walking/halucinations.   HPI: Chase Mendez is an 84 y.o. male with a known historychronic back pain, type 2 diabetes mellitus, hypertension and hypothyroidism, who presented to the emergency room with recurrent falls x3 over the last 3-4 days.  He stated that his legs would give out.  He has been confused per his wife over the last couple of days and has been having visual hallucinations.  He has been on methadone in the past and was recently placed on oxycodone abrasions, Zanaflex and has been taking Benadryl for itching. Suspected symptoms related due to polypharmacy.    Past Medical History:  Diagnosis Date  . Acute encephalopathy 12/08/2014  . Anxiety   . ARF (acute renal failure) ([place]) 12/08/2014  . Back pain   . Benign neoplasm of large bowel   . Capsulitis    fractured displaced metatarsal with capsulitis  . Chronic back pain   . Coronavirus infection   . Depression   . Diabetes mellitus without complication (HCC)    no medications currently  . Dysphagia   . Exostosis    painful, right hallux  . Foot drop, left    wears a brace  . Frequent falls 02/2018   poor balance, foot drop  . GERD (gastroesophageal reflux disease)   . Gout   . Hypertension   . Hypothyroidism   . Insomnia   . Low testosterone   . Microscopic hematuria 2016  . Myocardial infarction [facility])    patient unaware when it happened years ago.    . Pneumonia 12/07/2014  . Pressure ulcer 12/09/2014  . Sepsis ([place]) 12/08/2014  . Thyroid disease     Past Surgical History:  Procedure Laterality Date  . APPENDECTOMY    . [place]   x 2  . CHOLECYSTECTOMY    . ESOPHAGOGASTRODUODENOSCOPY (EGD) WITH PROPOFOL N/A 04/24/2017   Procedure: ESOPHAGOGASTRODUODENOSCOPY (EGD) WITH PROPOFOL;  Surgeon: Manya Silvas, MD;  Location: [facility] ENDOSCOPY;  Service: Endoscopy;  Laterality: N/A;  . EYE SURGERY  Bilateral 1983, 1985   cataract extractions  . SPINAL CORD STIMULATOR INSERTION N/A 03/13/2018   Procedure: SPINAL CORD STIMULATOR INSERTION;  Surgeon: Meade Maw, MD;  Location: [facility] ORS;  Service: Neurosurgery;  Laterality: N/A;    Family History  Problem Relation Age of Onset  . Heart disease Mother   . Diabetes Father   . Kidney cancer Brother     Social History:  reports that he has been smoking cigars. He has a 35.00 pack-year smoking history. He has never used smokeless tobacco. He reports current drug use. Drug: Oxycodone. He reports that he does not drink alcohol.  Allergies  Allergen Reactions  . Ibuprofen Nausea Only  . Sulfa Antibiotics Nausea Only    Medications: I have reviewed the patient's current medications.  ROS: Unable to obtain due to confusion   Physical Examination: Blood pressure (!) 168/93, pulse 91, temperature 97.6 F (36.4 C), temperature source Oral, resp. rate 17, height 6' (1.829 m), weight 89 kg, SpO2 98 %.   Neurological Examination   Mental Status: Alert, oriented to name and location  Cranial Nerves: II: Discs flat bilaterally; Visual fields grossly normal, pupils equal, round, reactive to light and accommodation III,IV, VI: ptosis not present, extra-ocular motions intact bilaterally V,VII: smile symmetric, facial light touch sensation normal bilaterally VIII: hearing normal bilaterally XI: bilateral shoulder shrug XII: midline tongue extension Motor: Generalized weakness more so  in lower extremities  Tone and bulk:normal tone throughout; no atrophy noted Sensory: Pinprick and light touch intact throughout, bilaterally Plantars: Right: downgoing   Left: downgoing Cerebellar: normal finger-to-nose      Laboratory Studies:   Basic Metabolic Panel: Recent Labs  Lab 01/27/19 1533 01/28/19 0446  NA 138 140  K 4.2 4.0  CL 103 105  CO2 26 25  GLUCOSE 137* 189*  BUN 20 17  CREATININE 1.24 0.90  CALCIUM 8.6* 8.9     Liver Function Tests: No results for input(s): AST, ALT, ALKPHOS, BILITOT, PROT, ALBUMIN in the last 168 hours. No results for input(s): LIPASE, AMYLASE in the last 168 hours. No results for input(s): AMMONIA in the last 168 hours.  CBC: Recent Labs  Lab 01/27/19 1533 01/28/19 0446  WBC 6.8 4.4  HGB 13.5 14.6  HCT 40.4 42.8  MCV 89.8 87.9  PLT 152 141*    Cardiac Enzymes: No results for input(s): CKTOTAL, CKMB, CKMBINDEX, TROPONINI in the last 168 hours.  BNP: Invalid input(s): POCBNP  CBG: No results for input(s): GLUCAP in the last 168 hours.  Microbiology: Results for orders placed or performed during the hospital encounter of 01/27/19  SARS CORONAVIRUS 2 (TAT 6-24 HRS) Nasopharyngeal Nasopharyngeal Swab     Status: None   Collection Time: 01/27/19 10:03 PM   Specimen: Nasopharyngeal Swab  Result Value Ref Range Status   SARS Coronavirus 2 NEGATIVE NEGATIVE Final    Comment: (NOTE) SARS-CoV-2 target nucleic acids are NOT DETECTED. The SARS-CoV-2 RNA is generally detectable in upper and lower respiratory specimens during the acute phase of infection. Negative results do not preclude SARS-CoV-2 infection, do not rule out co-infections with other pathogens, and should not be used as the sole basis for treatment or other patient management decisions. Negative results must be combined with clinical observations, patient history, and epidemiological information. The expected result is Negative. Fact Sheet for Patients: SugarRoll.be Fact Sheet for Healthcare Providers: https://www.woods-mathews.com/ This test is not yet approved or cleared by the [place] FDA and  has been authorized for detection and/or diagnosis of SARS-CoV-2 by FDA under an Emergency Use Authorization (EUA). This EUA will remain  in effect (meaning this test can be used) for the duration of the COVID-19 declaration under Section 56 4(b)(1) of the  Act, 21 U.S.C. section 360bbb-3(b)(1), unless the authorization is terminated or revoked sooner. Performed at [facility], [place] [address], [place], [place] [address]     Coagulation Studies: No results for input(s): LABPROT, INR in the last 72 hours.  Urinalysis:  Recent Labs  Lab 01/27/19 1817  COLORURINE YELLOW*  LABSPEC 1.016  PHURINE 5.0  GLUCOSEU NEGATIVE  HGBUR NEGATIVE  BILIRUBINUR NEGATIVE  KETONESUR NEGATIVE  PROTEINUR NEGATIVE  NITRITE NEGATIVE  LEUKOCYTESUR NEGATIVE    Lipid Panel:     Component Value Date/Time   CHOL 121 12/08/2014 0201   TRIG 115 12/08/2014 0201   HDL 40 (L) 12/08/2014 0201   CHOLHDL 3.0 12/08/2014 0201   VLDL 23 12/08/2014 0201   LDLCALC 58 12/08/2014 0201    HgbA1C:  Lab Results  Component Value Date   HGBA1C 6.3 (H) 12/08/2014    Urine Drug Screen:  No results found for: LABOPIA, COCAINSCRNUR, LABBENZ, AMPHETMU, THCU, LABBARB  Alcohol Level: No results for input(s): ETH in the last 168 hours.    Imaging: DG Chest 2 View  Result Date: 01/27/2019 CLINICAL DATA:  84 year old male with fall. EXAM: CHEST - 2 VIEW COMPARISON:  Chest  radiograph dated 11/28/2018. FINDINGS: There is no focal consolidation, pleural effusion, or pneumothorax. The cardiac silhouette is within normal limits. Atherosclerotic calcification of the aortic arch. Midthoracic spinal stimulator. No acute osseous pathology. IMPRESSION: No active cardiopulmonary disease. Electronically Signed   By: Anner Crete M.D.   On: 01/27/2019 18:51   CT HEAD WO CONTRAST  Result Date: 01/28/2019 CLINICAL DATA:  Ataxia.  Fall. EXAM: CT HEAD WITHOUT CONTRAST TECHNIQUE: Contiguous axial images were obtained from the base of the skull through the vertex without intravenous contrast. COMPARISON:  01/27/2019 FINDINGS: Brain: Mild age related volume loss. No acute intracranial abnormality. Specifically, no hemorrhage, hydrocephalus, mass lesion, acute infarction, or  significant intracranial injury. Vascular: No hyperdense vessel or unexpected calcification. Skull: No acute calvarial abnormality. Sinuses/Orbits: Visualized paranasal sinuses and mastoids clear. Orbital soft tissues unremarkable. Other: None IMPRESSION: No acute intracranial abnormality. Electronically Signed   By: Rolm Baptise M.D.   On: 01/28/2019 11:22   CT Head Wo Contrast  Result Date: 01/27/2019 CLINICAL DATA:  84 year old male status post multiple falls. Struck head today. Recently started on blood pressure medications. EXAM: CT HEAD WITHOUT CONTRAST TECHNIQUE: Contiguous axial images were obtained from the base of the skull through the vertex without intravenous contrast. COMPARISON:  Head CT 01/01/2019. FINDINGS: Brain: Stable cerebral volume. No midline shift, ventriculomegaly, mass effect, evidence of mass lesion, intracranial hemorrhage or evidence of cortically based acute infarction. Stable gray-white matter differentiation throughout the brain. Vascular: Calcified atherosclerosis at the skull base. No suspicious intracranial vascular hyperdensity. Skull: No fracture identified. Sinuses/Orbits: Visualized paranasal sinuses and mastoids are stable and well pneumatized. Other: No scalp hematoma identified. Orbits soft tissues appears stable. IMPRESSION: No acute intracranial abnormality or acute traumatic injury identified. Electronically Signed   By: Genevie Ann M.D.   On: 01/27/2019 16:18   CT Cervical Spine Wo Contrast  Result Date: 01/27/2019 CLINICAL DATA:  84 year old male status post multiple falls. Struck head today. Recently started on blood pressure medications. EXAM: CT CERVICAL SPINE WITHOUT CONTRAST TECHNIQUE: Multidetector CT imaging of the cervical spine was performed without intravenous contrast. Multiplanar CT image reconstructions were also generated. COMPARISON:  Head CT today reported separately. Cervical spine CT 01/01/2019. FINDINGS: Alignment: Stable straightening of  cervical lordosis. Cervicothoracic junction alignment is within normal limits. Bilateral posterior element alignment is within normal limits. Skull base and vertebrae: Visualized skull base is intact. No atlanto-occipital dissociation. No acute osseous abnormality identified. Soft tissues and spinal canal: No prevertebral fluid or swelling. No visible canal hematoma. Stable visible noncontrast neck soft tissues. Disc levels: Widespread advanced cervical spine degeneration appears stable, including at the anterior C1-C2 articulation. Upper chest: Visible upper thoracic levels appear intact. Negative lung apices. IMPRESSION: No acute traumatic injury identified in the cervical spine. Electronically Signed   By: Genevie Ann M.D.   On: 01/27/2019 16:21   DG HIP UNILAT WITH PELVIS 2-3 VIEWS LEFT  Result Date: 01/27/2019 CLINICAL DATA:  Fall EXAM: DG HIP (WITH OR WITHOUT PELVIS) 2-3V LEFT COMPARISON:  11/28/2018 FINDINGS: Alignment appears anatomic. There is no acute fracture identified. Joint space is preserved. Degenerative changes of the lower lumbar spine. Stimulator generator is identified overlying the left pelvis. IMPRESSION: No acute fracture. Electronically Signed   By: Macy Mis M.D.   On: 01/27/2019 18:50     Assessment/Plan:  84 y.o. male with a known historychronic back pain, type 2 diabetes mellitus, hypertension and hypothyroidism, who presented to the emergency room with recurrent falls x3 over the last 3-4 days.  He stated that his legs would give out.  He has been confused per his wife over the last couple of days and has been having visual hallucinations.  He has been on methadone in the past and was recently placed on oxycodone abrasions, Zanaflex and has been taking Benadryl for itching. Suspected symptoms related due to polypharmacy.    - Hallucinations seem to have resolved - Palls likely related to metabolic reasons/hypotension  - needs pt/ot - b12 ordered  - CTH no acute  abnormalities  - No clear focality on examination at this time. Not convinced of acute ischemia.  - No need for urgent MRI   01/28/2019, 11:33 AM

## 2019-01-28 NOTE — Telephone Encounter (Signed)
The patients wife called in today to let Dr. Dossie Arbour know that Mr. Bazinet fell 3 times, she took him to the ER and they kept him. They wouldn't let her go in. She said he was really shaky and confused. She talked to him on the phone, and he told her they gave him methadone. That is what Mr. Markell told her on the phone but she hasnt been able to talk to a nurse or doctor. She is upset because she wasn't able to tell them what was going on and they wont call her. She said that his heart function has dropped to 45. She said she told him that he wasn't supposed to take methadone but he took it anyway. She said that Mr. Tenold told her that they think the pain medicine he has been taking is causing him all of his problems. She thinks he had a ministroke. She wanted to make Dr. Dossie Arbour aware of what is going on, even though she really doesn't know whats going on because no one has called her.

## 2019-01-28 NOTE — ED Notes (Signed)
Calked floor to speak with RN. RN not ready for report. Advised pt would be enroute in 15 and please call this RN with any questions.

## 2019-01-28 NOTE — TOC Transition Note (Signed)
Transition of Care [facility]) - CM/SW Discharge Note   Patient Details  Name: Chase Mendez MRN: [id number] Date of Birth: 17-May-1934  [facility] CM/SW Contact:  Su Hilt, RN Phone Number: 01/28/2019, 3:40 PM   Clinical Narrative:    Met with the Patient to discuss DC plan and needs He lives at home with his wife, he has a RW, a raised toilet and grab bars at home, no additional DME needed, He has used [facility] in the past and would like to use them again. I notified Corene Cornea He is up to date with his PCP No additional needs   Final next level of care: Home w Home Health Services Barriers to Discharge: Barriers Resolved   Patient Goals and CMS Choice   CMS Medicare.gov Compare Post Acute Care list provided to:: Patient Choice offered to / list presented to : Patient  Discharge Placement                       Discharge Plan and Services   Discharge Planning Services: CM Consult Post Acute Care Choice: Home Health          DME Arranged: N/A         HH Arranged: PT, OT [place] Agency: [place] Date HH Agency Contacted: 01/28/19 Time [place]: 4128 Representative spoke with at [place]: [place] ([place]) Interventions     Readmission Risk Interventions No flowsheet data found.

## 2019-01-28 NOTE — Progress Notes (Signed)
Discharge instructions provided and reviewed with patient and caregiver (spouse.) by this RN. All questions addressed at time of discharge. All belongings returned. Unit phone number provided should any further questions arise. Pt transport home via POA and spouse.

## 2019-01-28 NOTE — Discharge Summary (Signed)
[facility] Discharge Summary   Patient: Chase Mendez AVW:[id number]   PCP: Jodi Marble, MD DOB: 10-18-1934   Date of admission: 01/27/2019   Date of discharge:  01/28/2019    Discharge Diagnoses:  Principal diagnosis Delirium secondary to polypharmacy. Chronic pain syndrome.  Active Problems:   Delirium   Admitted From: Home Disposition:  Home with home health  Recommendations for Outpatient Follow-up:  1. PCP: Follow-up with PCP and adjust the dosage of the medication for pain control 2. Follow up LABS/TEST: None  Follow-up Information    Jodi Marble, MD On 02/14/2019.   Specialty: Internal Medicine Why: Call your PCP to set up an appointment next week for repeat exam and check-up;  @ 10:15 am Contact information: [address] [place] [address] [phone]          Diet recommendation: Cardiac diet  Activity: The patient is advised to gradually reintroduce usual activities,as tolerated  Discharge Condition: good  Code Status: Full code   History of present illness: As per the H and P dictated on admission, "Chase Mendez  is a 84 y.o. Caucasian male with a known history of multiple medical problems including chronic back pain, type 2 diabetes mellitus, hypertension and hypothyroidism, who presented to the emergency room with acute onset of recurrent falls x3 over the last 3 days.  He stated that his legs would give out.  He has been confused per his wife over the last couple of days and has been having visual hallucinations.  He has been on methadone in the past and was recently placed on oxycodone abrasions, Zanaflex and has been taking Benadryl for itching.[name]  He has not been having much appetite lately.  He feels so weak when he stands.  No dysuria, oliguria or hematuria or flank pain.  No chest pain or palpitations.  No nausea vomiting or diarrhea or abdominal pain.  No bleeding diathesis.  Upon presentation to the emergency room, blood  pressure was 89/46, pulse oximetry 91% on room air with otherwise normal vital signs.  Urine drug screen came back negative.  CMP sugar BUN of 20 and creatinine 1.24 compared to 19 and 1.18 on 01/01/2019 and CBC was within normal.  His COVID-19 test is currently pending.  TSH was 1.4.  Two-view chest x-ray showed no acute cardiopulmonary disease.  EKG showed slightly accelerated junctional rhythm with with a rate of 59 and LAD and right bundle branch block.  Head CT scan revealed no acute intracranial normalities.  The patient was given 10 mg of IV Decadron and 50 mg of p.o. oxycodone in the ER as well as 1 L bolus of IV normal saline.  The patient continued to have visual hallucinations seeing wasps in the ER.  He will be admitted to the medical monitored bed for further evaluation and management"  Hospital Course:  Summary of his active problems in the hospital is as following. Delirium from polypharmacy. Chronic pain syndrome. Recurrent fall Patient is on multiple pain medication. Patient is not on methadone for wife as well as [place] controlled substance list. Presents with a fall. Examination reveals metabolic encephalopathy. CT scan of the head x2 was negative for any acute stroke. Neurology was consulted recommend no further work-up. PT recommends home with home health. Patient's mentation improved significantly and there were no more confusion after holding medications. Suspect polypharmacy here. We will reduce the dose of the gabapentin from 600 mg 4 times daily to 600 mg 3 times daily.  Discussed in detail regarding goals of care with wife. This might be the stage at which the patient is unable to tolerate further escalation of the pain medication regimen. They may need to discuss whether controlling the pain is an option versus controlling medication is an option. Recommend them to discuss that with pain management clinic on follow-up.  Essential hypertension. Continuing home  regimen.  Hypothyroidism. Continue Synthroid.  Depression. Continue Celexa.  GERD. Continuing PPI.  Patient was seen by physical therapy, who recommended Home health, which was arranged. On the day of the discharge the patient's vitals were stable, and no other acute medical condition were reported by patient. the patient was felt safe to be discharge at Home with Home health.  Consultants: Neurology Procedures: None  DISCHARGE MEDICATION: Allergies as of 01/28/2019      Reactions   Ibuprofen Nausea Only   Sulfa Antibiotics Nausea Only      Medication List    STOP taking these medications   diphenhydrAMINE 25 MG tablet Commonly known as: BENADRYL   tiZANidine 4 MG capsule Commonly known as: ZANAFLEX     TAKE these medications   allopurinol 100 MG tablet Commonly known as: ZYLOPRIM Take 100 mg by mouth daily.   amLODipine 10 MG tablet Commonly known as: NORVASC Take 10 mg by mouth daily.   AndroGel 40.5 MG/2.5GM (1.62%) Gel Generic drug: Testosterone Place 2 Pump onto the skin 3 (three) times a week.   aspirin 81 MG tablet Take 81 mg by mouth daily.   atorvastatin 20 MG tablet Commonly known as: LIPITOR Take 20 mg by mouth daily.   citalopram 40 MG tablet Commonly known as: CELEXA Take 40 mg by mouth daily.   fluticasone 50 MCG/ACT nasal spray Commonly known as: FLONASE Place 1 spray into both nostrils daily as needed for rhinitis.   gabapentin 600 MG tablet Commonly known as: Neurontin Take 1 tablet (600 mg total) by mouth 3 (three) times daily. What changed: when to take this   isosorbide mononitrate 30 MG 24 hr tablet Commonly known as: IMDUR Take 30 mg by mouth daily.   levothyroxine 125 MCG tablet Commonly known as: SYNTHROID Take 125 mcg by mouth daily before breakfast.   linaclotide 290 MCG Caps capsule Commonly known as: LINZESS Take 290 mcg by mouth daily as needed (for constipation).   losartan 100 MG tablet Commonly known as:  COZAAR Take 100 mg by mouth daily.   Myrbetriq 25 MG Tb24 tablet Generic drug: mirabegron ER Take 25 mg by mouth daily.   naloxone 4 MG/0.1ML Liqd nasal spray kit Commonly known as: Narcan Spray into one nostril. Repeat with second device into other nostril after 2-3 minutes if no or minimal response. Use in case of opioid overdose. What changed:   how much to take  how to take this  when to take this   ondansetron 4 MG disintegrating tablet Commonly known as: Zofran ODT Take 1 tablet (4 mg total) by mouth every 8 (eight) hours as needed for nausea or vomiting.   oxyCODONE 15 MG immediate release tablet Commonly known as: ROXICODONE Take 1 tablet (15 mg total) by mouth every 6 (six) hours as needed for pain. Must last 30 days   oxyCODONE 15 MG immediate release tablet Commonly known as: ROXICODONE Take 1 tablet (15 mg total) by mouth every 6 (six) hours as needed for pain. Must last 30 days Start taking on: February 16, 2019   oxyCODONE 15 MG immediate release tablet Commonly known  as: ROXICODONE Take 1 tablet (15 mg total) by mouth every 6 (six) hours as needed for pain. Must last 30 days Start taking on: March 18, 2019   pantoprazole 40 MG tablet Commonly known as: PROTONIX Take 40 mg by mouth 2 (two) times daily.   vitamin B-12 1000 MCG tablet Commonly known as: CYANOCOBALAMIN Take 1,000 mcg by mouth daily.   vitamin E 400 UNIT capsule Take 400 Units by mouth daily.      Allergies  Allergen Reactions  . Ibuprofen Nausea Only  . Sulfa Antibiotics Nausea Only   Discharge Instructions    Diet - low sodium heart healthy   Complete by: As directed    Discharge instructions   Complete by: As directed    It is important that you read the given instructions as well as go over your medication list with RN to help you understand your care after this hospitalization.  Please follow-up with PCP in 1-2 weeks.  Please note that NO REFILLS for any discharge  medications will be authorized once you are discharged, as it is imperative that you return to your primary care physician (or establish a relationship with a primary care physician if you do not have one) for your aftercare needs so that they can reassess your need for medications and monitor your lab values.  Please request your primary care physician to go over all Hospital Tests and Procedure/Radiological results at the follow up. Please get all Hospital records sent to your PCP by signing hospital release before you go home.   Do not take more than prescribed Pain, Sleep and Anxiety Medications.  You were cared for by a hospitalist during your hospital stay. If you have any questions about your discharge medications or the care you received while you were in the hospital after you are discharged, you can call the unit _0 @ you were admitted to and ask to speak with the hospitalist Berle Mull. Ask for Hospitalist on call if the hospitalist that took care of you is not available.   Once you are discharged, your primary care physician will handle any further medical issues.  You Must read complete instructions/literature along with all the possible adverse reactions/side effects for all the Medicines you take and that have been prescribed to you. Take any new Medicines after you have completely understood and accept all the possible adverse reactions/side effects.  If you have smoked or chewed Tobacco in the last 2 yrs please STOP smoking STOP any Recreational drug use.  If you drink alcohol, please safely STOP the use. Do not drive, operating heavy machinery, perform activities at heights, swimming or participation in water activities or provide baby sitting services under influence.  Wear Seat belts while driving.   Increase activity slowly   Complete by: As directed      Discharge Exam: Filed Weights   01/27/19 1530  Weight: 89 kg   Vitals:   01/28/19 1146 01/28/19 1553  BP: (!)  149/75 138/66  Pulse: 80 70  Resp:  16  Temp:  97.9 F (36.6 C)  SpO2: 99% 99%   General: Appear in no distress, no Rash; Oral Mucosa Clear, moist. no Abnormal Mass Or lumps Cardiovascular: S1 and S2 Present, no Murmur, Respiratory: normal respiratory effort, Bilateral Air entry present and Clear to Auscultation, no Crackles, no wheezes Abdomen: Bowel Sound present, Soft and no tenderness, no hernia Extremities: no Pedal edema, no calf tenderness Neurology: alert and oriented to time, place, and person affect  appropriate.  The results of significant diagnostics from this hospitalization (including imaging, microbiology, ancillary and laboratory) are listed below for reference.    Significant Diagnostic Studies: DG Chest 2 View  Result Date: 01/27/2019 CLINICAL DATA:  84 year old male with fall. EXAM: CHEST - 2 VIEW COMPARISON:  Chest radiograph dated 11/28/2018. FINDINGS: There is no focal consolidation, pleural effusion, or pneumothorax. The cardiac silhouette is within normal limits. Atherosclerotic calcification of the aortic arch. Midthoracic spinal stimulator. No acute osseous pathology. IMPRESSION: No active cardiopulmonary disease. Electronically Signed   By: Anner Crete M.D.   On: 01/27/2019 18:51   CT HEAD WO CONTRAST  Result Date: 01/28/2019 CLINICAL DATA:  Ataxia.  Fall. EXAM: CT HEAD WITHOUT CONTRAST TECHNIQUE: Contiguous axial images were obtained from the base of the skull through the vertex without intravenous contrast. COMPARISON:  01/27/2019 FINDINGS: Brain: Mild age related volume loss. No acute intracranial abnormality. Specifically, no hemorrhage, hydrocephalus, mass lesion, acute infarction, or significant intracranial injury. Vascular: No hyperdense vessel or unexpected calcification. Skull: No acute calvarial abnormality. Sinuses/Orbits: Visualized paranasal sinuses and mastoids clear. Orbital soft tissues unremarkable. Other: None IMPRESSION: No acute  intracranial abnormality. Electronically Signed   By: Rolm Baptise M.D.   On: 01/28/2019 11:22   CT Head Wo Contrast  Result Date: 01/27/2019 CLINICAL DATA:  84 year old male status post multiple falls. Struck head today. Recently started on blood pressure medications. EXAM: CT HEAD WITHOUT CONTRAST TECHNIQUE: Contiguous axial images were obtained from the base of the skull through the vertex without intravenous contrast. COMPARISON:  Head CT 01/01/2019. FINDINGS: Brain: Stable cerebral volume. No midline shift, ventriculomegaly, mass effect, evidence of mass lesion, intracranial hemorrhage or evidence of cortically based acute infarction. Stable gray-white matter differentiation throughout the brain. Vascular: Calcified atherosclerosis at the skull base. No suspicious intracranial vascular hyperdensity. Skull: No fracture identified. Sinuses/Orbits: Visualized paranasal sinuses and mastoids are stable and well pneumatized. Other: No scalp hematoma identified. Orbits soft tissues appears stable. IMPRESSION: No acute intracranial abnormality or acute traumatic injury identified. Electronically Signed   By: Genevie Ann M.D.   On: 01/27/2019 16:18   CT Head Wo Contrast  Result Date: 01/01/2019 CLINICAL DATA:  Frequent falls, headache EXAM: CT HEAD WITHOUT CONTRAST CT CERVICAL SPINE WITHOUT CONTRAST TECHNIQUE: Multidetector CT imaging of the head and cervical spine was performed following the standard protocol without intravenous contrast. Multiplanar CT image reconstructions of the cervical spine were also generated. COMPARISON:  11/28/2018 FINDINGS: CT HEAD FINDINGS Brain: No evidence of acute infarction, hemorrhage, hydrocephalus, extra-axial collection or mass lesion/mass effect. Periventricular white matter hypodensity and global volume loss. Vascular: No hyperdense vessel or unexpected calcification. Skull: Normal. Negative for fracture or focal lesion. Sinuses/Orbits: No acute finding. Other: None. CT  CERVICAL SPINE FINDINGS Alignment: Normal. Skull base and vertebrae: No acute fracture. No primary bone lesion or focal pathologic process. Soft tissues and spinal canal: No prevertebral fluid or swelling. No visible canal hematoma. Disc levels: Moderate multilevel disc degenerative disease and osteophytosis. Upper chest: Negative. Other: None. IMPRESSION: 1. No acute intracranial pathology. Small-vessel white matter disease and global volume loss. 2. No fracture or static subluxation of the cervical spine. Multilevel disc degenerative disease. Electronically Signed   By: Eddie Candle M.D.   On: 01/01/2019 17:04   CT Cervical Spine Wo Contrast  Result Date: 01/27/2019 CLINICAL DATA:  84 year old male status post multiple falls. Struck head today. Recently started on blood pressure medications. EXAM: CT CERVICAL SPINE WITHOUT CONTRAST TECHNIQUE: Multidetector CT imaging of the cervical  spine was performed without intravenous contrast. Multiplanar CT image reconstructions were also generated. COMPARISON:  Head CT today reported separately. Cervical spine CT 01/01/2019. FINDINGS: Alignment: Stable straightening of cervical lordosis. Cervicothoracic junction alignment is within normal limits. Bilateral posterior element alignment is within normal limits. Skull base and vertebrae: Visualized skull base is intact. No atlanto-occipital dissociation. No acute osseous abnormality identified. Soft tissues and spinal canal: No prevertebral fluid or swelling. No visible canal hematoma. Stable visible noncontrast neck soft tissues. Disc levels: Widespread advanced cervical spine degeneration appears stable, including at the anterior C1-C2 articulation. Upper chest: Visible upper thoracic levels appear intact. Negative lung apices. IMPRESSION: No acute traumatic injury identified in the cervical spine. Electronically Signed   By: Genevie Ann M.D.   On: 01/27/2019 16:21   CT Cervical Spine Wo Contrast  Result Date:  01/01/2019 CLINICAL DATA:  Frequent falls, headache EXAM: CT HEAD WITHOUT CONTRAST CT CERVICAL SPINE WITHOUT CONTRAST TECHNIQUE: Multidetector CT imaging of the head and cervical spine was performed following the standard protocol without intravenous contrast. Multiplanar CT image reconstructions of the cervical spine were also generated. COMPARISON:  11/28/2018 FINDINGS: CT HEAD FINDINGS Brain: No evidence of acute infarction, hemorrhage, hydrocephalus, extra-axial collection or mass lesion/mass effect. Periventricular white matter hypodensity and global volume loss. Vascular: No hyperdense vessel or unexpected calcification. Skull: Normal. Negative for fracture or focal lesion. Sinuses/Orbits: No acute finding. Other: None. CT CERVICAL SPINE FINDINGS Alignment: Normal. Skull base and vertebrae: No acute fracture. No primary bone lesion or focal pathologic process. Soft tissues and spinal canal: No prevertebral fluid or swelling. No visible canal hematoma. Disc levels: Moderate multilevel disc degenerative disease and osteophytosis. Upper chest: Negative. Other: None. IMPRESSION: 1. No acute intracranial pathology. Small-vessel white matter disease and global volume loss. 2. No fracture or static subluxation of the cervical spine. Multilevel disc degenerative disease. Electronically Signed   By: Eddie Candle M.D.   On: 01/01/2019 17:04   DG HIP UNILAT WITH PELVIS 2-3 VIEWS LEFT  Result Date: 01/27/2019 CLINICAL DATA:  Fall EXAM: DG HIP (WITH OR WITHOUT PELVIS) 2-3V LEFT COMPARISON:  11/28/2018 FINDINGS: Alignment appears anatomic. There is no acute fracture identified. Joint space is preserved. Degenerative changes of the lower lumbar spine. Stimulator generator is identified overlying the left pelvis. IMPRESSION: No acute fracture. Electronically Signed   By: Macy Mis M.D.   On: 01/27/2019 18:50    Microbiology: Recent Results (from the past 240 hour(s))  SARS CORONAVIRUS 2 (TAT 6-24 HRS)  Nasopharyngeal Nasopharyngeal Swab     Status: None   Collection Time: 01/27/19 10:03 PM   Specimen: Nasopharyngeal Swab  Result Value Ref Range Status   SARS Coronavirus 2 NEGATIVE NEGATIVE Final    Comment: (NOTE) SARS-CoV-2 target nucleic acids are NOT DETECTED. The SARS-CoV-2 RNA is generally detectable in upper and lower respiratory specimens during the acute phase of infection. Negative results do not preclude SARS-CoV-2 infection, do not rule out co-infections with other pathogens, and should not be used as the sole basis for treatment or other patient management decisions. Negative results must be combined with clinical observations, patient history, and epidemiological information. The expected result is Negative. Fact Sheet for Patients: SugarRoll.be Fact Sheet for Healthcare Providers: https://www.woods-mathews.com/ This test is not yet approved or cleared by the [place] FDA and  has been authorized for detection and/or diagnosis of SARS-CoV-2 by FDA under an Emergency Use Authorization (EUA). This EUA will remain  in effect (meaning this test can be used) for the  duration of the COVID-19 declaration under Section 56 4(b)(1) of the Act, 21 U.S.C. section 360bbb-3(b)(1), unless the authorization is terminated or revoked sooner. Performed at [facility], [place] [address], [place], [place] [address]      Labs: CBC: Recent Labs  Lab 01/27/19 1533 01/28/19 0446  WBC 6.8 4.4  HGB 13.5 14.6  HCT 40.4 42.8  MCV 89.8 87.9  PLT 152 470*   Basic Metabolic Panel: Recent Labs  Lab 01/27/19 1533 01/28/19 0446  NA 138 140  K 4.2 4.0  CL 103 105  CO2 26 25  GLUCOSE 137* 189*  BUN 20 17  CREATININE 1.24 0.90  CALCIUM 8.6* 8.9   Liver Function Tests: No results for input(s): AST, ALT, ALKPHOS, BILITOT, PROT, ALBUMIN in the last 168 hours. No results for input(s): LIPASE, AMYLASE in the last 168 hours. No results  for input(s): AMMONIA in the last 168 hours. Cardiac Enzymes: No results for input(s): CKTOTAL, CKMB, CKMBINDEX, TROPONINI in the last 168 hours. BNP (last 3 results) No results for input(s): BNP in the last 8760 hours. CBG: No results for input(s): GLUCAP in the last 168 hours.  Time spent: 35 minutes  Signed:  Berle Mull  [facility]  01/28/2019 7:54 PM

## 2019-01-28 NOTE — Evaluation (Signed)
Physical Therapy Evaluation Patient Details Name: Chase Mendez MRN: [id number] DOB: [birth year] Today's Date: 01/28/2019   History of Present Illness  84 y.o. male with a known historychronic back pain with L foot drop and spinal cord stimulator, type 2 diabetes mellitus, hypertension and hypothyroidism, who presented to the emergency room with recurrent falls x3 over the last 3-4 days.  He stated that his legs would give out.  He has been confused per his wife over the last couple of days and has been having visual hallucinations. Suspect polypharmacy per MD notes.    Clinical Impression  Patient alert, in bed, agreeable to PT. Patient reported in the last week he has had at least 3 falls, often has a posterior LOB. At least 10 falls in the last 71months, and has been receiving HHPT. The patient lives with his wife/daughter where someone is available 24/7. The patient ambulates with rollator at baseline.  Bed mobility performed with CGA and HOB elevated. L AFO and both shoes donned with minA at EOB. Sit <> stand performed twice, CGA with cues for proper hand placement and cues for weight shift in preparation for transfer. The patient ambulated ~91ft with RW and CGA, no LOB noted, exhibited decreased stride length and narrow base of support. The patient was educated about importance of assistance when OOB/mobility and RW.  Overall the patient demonstrated deficits (see "PT Problem List") that impede the patient's functional abilities, safety, and mobility and would benefit from skilled PT intervention. Recommendation is HHPT with supervision for mobility/OOB.  Of note orthostatic hypotension vitals assessed WFLs.    Follow Up Recommendations Home health PT;Supervision for mobility/OOB    Equipment Recommendations  Rolling walker with 5" wheels    Recommendations for Other Services       Precautions / Restrictions Precautions Precautions: Fall Precaution Comments: L foot drop-AFO Required  Braces or Orthoses: Other Brace Other Brace: AFO Restrictions Weight Bearing Restrictions: No      Mobility  Bed Mobility Overal bed mobility: Needs Assistance Bed Mobility: Supine to Sit     Supine to sit: Min guard;HOB elevated        Transfers Overall transfer level: Needs assistance Equipment used: Rolling walker (2 wheeled) Transfers: Sit to/from Stand Sit to Stand: Min guard;Min assist;From elevated surface         General transfer comment: from elevated bed minA with RW stabilization, from recliner CGA with cues for hand placement  Ambulation/Gait Ambulation/Gait assistance: Min guard Gait Distance (Feet): 70 Feet Assistive device: Rolling walker (2 wheeled)   Gait velocity: decreased   General Gait Details: narrow base of support, decreased stride length, no LOB noted  Stairs            Wheelchair Mobility    Modified Rankin (Stroke Patients Only)       Balance Overall balance assessment: Needs assistance Sitting-balance support: Feet supported Sitting balance-Leahy Scale: Fair       Standing balance-Leahy Scale: Fair                               Pertinent Vitals/Pain Pain Assessment: No/denies pain    Home Living Family/patient expects to be discharged to:: Private residence Living Arrangements: Spouse/significant other;Children Available Help at Discharge: Family Type of Home: House Home Access: Stairs to enter Entrance Stairs-Rails: Right Entrance Stairs-Number of Steps: 3-4 Home Layout: One level Home Equipment: Clinical cytogeneticist - 4 wheels Additional Comments: 3 falls in the  last week at least 10 falls in the last 6 months    Prior Function Level of Independence: Needs assistance   Gait / Transfers Assistance Needed: uses rollator at baseline  ADL's / Homemaking Assistance Needed: assistance for bathing, able to get dressed, family provides cooking/cleaning  Comments: has HHPT currently. uses rollator at  home and out in community     Hand Dominance   Dominant Hand: Right    Extremity/Trunk Assessment   Upper Extremity Assessment Upper Extremity Assessment: Overall WFL for tasks assessed;Generalized weakness    Lower Extremity Assessment Lower Extremity Assessment: Generalized weakness    Cervical / Trunk Assessment Cervical / Trunk Assessment: Normal  Communication   Communication: No difficulties  Cognition Arousal/Alertness: Awake/alert Behavior During Therapy: WFL for tasks assessed/performed Overall Cognitive Status: Within Functional Limits for tasks assessed                                        General Comments      Exercises     Assessment/Plan    PT Assessment Patient needs continued PT services  PT Problem List Decreased strength;Decreased mobility;Decreased range of motion;Decreased activity tolerance;Decreased balance;Pain       PT Treatment Interventions DME instruction;Therapeutic exercise;Gait training;Balance training;Stair training;Neuromuscular re-education;Functional mobility training;Therapeutic activities;Patient/family education    PT Goals (Current goals can be found in the Care Plan section)  Acute Rehab PT Goals Patient Stated Goal: to go home PT Goal Formulation: With patient Time For Goal Achievement: 02/11/19 Potential to Achieve Goals: Good    Frequency Min 2X/week   Barriers to discharge Inaccessible home environment      Co-evaluation               AM-PAC PT "6 Clicks" Mobility  Outcome Measure Help needed turning from your back to your side while in a flat bed without using bedrails?: A Little Help needed moving from lying on your back to sitting on the side of a flat bed without using bedrails?: A Little Help needed moving to and from a bed to a chair (including a wheelchair)?: A Little Help needed standing up from a chair using your arms (e.g., wheelchair or bedside chair)?: A Little Help needed to  walk in hospital room?: A Little Help needed climbing 3-5 steps with a railing? : A Little 6 Click Score: 18    End of Session Equipment Utilized During Treatment: Gait belt Activity Tolerance: Patient tolerated treatment well Patient left: in chair;with chair alarm set;with call bell/phone within reach Nurse Communication: Mobility status PT Visit Diagnosis: Muscle weakness (generalized) (M62.81);Difficulty in walking, not elsewhere classified (R26.2);Other abnormalities of gait and mobility (R26.89)    Time: [id number] PT Time Calculation (min) (ACUTE ONLY): 38 min   Charges:   PT Evaluation $PT Eval Low Complexity: 1 Low PT Treatments $Therapeutic Exercise: 23-37 mins        Lieutenant Diego PT, DPT 3:41 PM,01/28/19

## 2019-01-29 ENCOUNTER — Telehealth: Payer: Self-pay

## 2019-01-29 NOTE — TOC Progression Note (Signed)
Cassie with [facility] called me back and stated he is INN and they will see him within 24-48 hours.  They will reach out to the patient to set it up

## 2019-01-29 NOTE — Telephone Encounter (Signed)
Cyndi, anytime you get one of these calls, go into the chart and do some research under "CHART REVIEW/"ENCOUNTERS". As it turns out, he was admitted due to disorientation. He is on many medications and therefore they have the cause of it as possible "POLYPHARMACY", versus "DEHYDRATION", which is a very common cause of disorientation in the elderly. Always do the scout work first and then report back to me with the information. In addition, there are no chart orders for "METHADONE", so it is likely that he was confused about that. Of course, your main question to his wife should be if he has been recently started on a new medication, or if he has taken a new OTC medication or remedy.   Call his wife and let her know he has not been given any methadone. Get an update from her and if there is anything else we need to do, send me another note, otherwise let them complete the workup since it could also be a small stroke, as she said.

## 2019-01-29 NOTE — TOC Progression Note (Unsigned)
Corene Cornea with [facility] called after the patient discharged and notified me that they would not be able to accept the patient, I called Helene Kelp with [facility] requesting them to accept the patient, [facility] is not able to accept the patient, I left a message with Malachy Mood with [facility] and a message with [place] from [facility] to call me back I called Cassie with [facility], she will look up the patient to determine if he is INN with them and if so they will accept him. She will call me back

## 2019-02-14 ENCOUNTER — Other Ambulatory Visit: Payer: Self-pay

## 2019-02-17 ENCOUNTER — Inpatient Hospital Stay: Payer: Medicare HMO | Admitting: Oncology

## 2019-02-17 ENCOUNTER — Encounter: Payer: Self-pay | Admitting: Oncology

## 2019-02-17 ENCOUNTER — Inpatient Hospital Stay: Payer: Medicare HMO | Attending: Oncology

## 2019-02-17 ENCOUNTER — Other Ambulatory Visit: Payer: Self-pay

## 2019-02-17 VITALS — BP 108/71 | HR 65 | Temp 98.6°F | Resp 16 | Wt 197.7 lb

## 2019-02-17 DIAGNOSIS — R5383 Other fatigue: Secondary | ICD-10-CM | POA: Diagnosis not present

## 2019-02-17 DIAGNOSIS — I252 Old myocardial infarction: Secondary | ICD-10-CM | POA: Diagnosis not present

## 2019-02-17 DIAGNOSIS — Z9884 Bariatric surgery status: Secondary | ICD-10-CM | POA: Diagnosis not present

## 2019-02-17 DIAGNOSIS — G4700 Insomnia, unspecified: Secondary | ICD-10-CM | POA: Diagnosis not present

## 2019-02-17 DIAGNOSIS — K219 Gastro-esophageal reflux disease without esophagitis: Secondary | ICD-10-CM | POA: Insufficient documentation

## 2019-02-17 DIAGNOSIS — Z79899 Other long term (current) drug therapy: Secondary | ICD-10-CM | POA: Insufficient documentation

## 2019-02-17 DIAGNOSIS — Z7982 Long term (current) use of aspirin: Secondary | ICD-10-CM | POA: Insufficient documentation

## 2019-02-17 DIAGNOSIS — E119 Type 2 diabetes mellitus without complications: Secondary | ICD-10-CM | POA: Diagnosis not present

## 2019-02-17 DIAGNOSIS — D696 Thrombocytopenia, unspecified: Secondary | ICD-10-CM

## 2019-02-17 DIAGNOSIS — F17210 Nicotine dependence, cigarettes, uncomplicated: Secondary | ICD-10-CM | POA: Diagnosis not present

## 2019-02-17 DIAGNOSIS — F419 Anxiety disorder, unspecified: Secondary | ICD-10-CM | POA: Diagnosis not present

## 2019-02-17 DIAGNOSIS — E039 Hypothyroidism, unspecified: Secondary | ICD-10-CM | POA: Insufficient documentation

## 2019-02-17 LAB — CBC WITH DIFFERENTIAL/PLATELET
Abs Immature Granulocytes: 0.02 10*3/uL (ref 0.00–0.07)
Basophils Absolute: 0 10*3/uL (ref 0.0–0.1)
Basophils Relative: 1 %
Eosinophils Absolute: 0.3 10*3/uL (ref 0.0–0.5)
Eosinophils Relative: 5 %
HCT: 46.1 % (ref 39.0–52.0)
Hemoglobin: 14.7 g/dL (ref 13.0–17.0)
Immature Granulocytes: 0 %
Lymphocytes Relative: 23 %
Lymphs Abs: 1.4 10*3/uL (ref 0.7–4.0)
MCH: 29.3 pg (ref 26.0–34.0)
MCHC: 31.9 g/dL (ref 30.0–36.0)
MCV: 91.8 fL (ref 80.0–100.0)
Monocytes Absolute: 0.6 10*3/uL (ref 0.1–1.0)
Monocytes Relative: 10 %
Neutro Abs: 3.8 10*3/uL (ref 1.7–7.7)
Neutrophils Relative %: 61 %
Platelets: 180 10*3/uL (ref 150–400)
RBC: 5.02 MIL/uL (ref 4.22–5.81)
RDW: 13 % (ref 11.5–15.5)
WBC: 6.1 10*3/uL (ref 4.0–10.5)
nRBC: 0 % (ref 0.0–0.2)

## 2019-02-17 LAB — IMMATURE PLATELET FRACTION: Immature Platelet Fraction: 2.1 % (ref 1.2–8.6)

## 2019-02-17 NOTE — Progress Notes (Signed)
Hematology/Oncology follow up  note [facility] Telephone:([phone] Fax:(336) [phone]   Patient Care Team: Jodi Marble, MD as PCP - General (Internal Medicine)  REFERRING PROVIDER: Jodi Marble, MD  CHIEF COMPLAINTS/REASON FOR VISIT:  Evaluation of thrombocytopenia  HISTORY OF PRESENTING ILLNESS:  Chase Mendez is a  85 y.o.  male male who was seen in consultation at the request of Dr.Tejan-Sie for evaluation  for evaluation of thrombocytopenia  Reviewed patient's labs which were obtained by PCP.  03/01/2018 cbc showed mildly decreased platelet counts at 147,000, wbc 6.9, , hemoglobin 14.7  Patient keeps a record of his platelet counts for the past few years.  Ranges from 100s to 140s. Fluctuating. 12/09/2014, CBC showed 1 14,000.  Associated symptoms or signs:  Denies weight loss, fever, chills, fatigue, night sweats.  Denies hematochezia, hematuria, hematemesis, epistaxis, black tarry stool.  Denies easy bruising.   Context:  History of hepatitis or HIV infection, denies History of gastric bypass: Denies Chronic fatigue at baseline.  INTERVAL HISTORY Chase Mendez is a 85 y.o. male who has above history reviewed by me today presents for follow up visit for management of thrombocytopenia.  Problems and complaints are listed below: Patient was accompanied by his wife today. Recently admission due to recurrent falls and mental status change.  CT head was negative. His mentation was improved after holding some of his medications.  Suspect polypharmacy. Gabapentin dose was reduced.  Otherwise he has no complaints.  He denies additional confusion episodes. Denies.  Any acute bleeding  Review of Systems  Constitutional: Positive for fatigue. Negative for appetite change, chills, fever and unexpected weight change.  HENT:   Negative for hearing loss and voice change.   Eyes: Negative for eye problems and icterus.  Respiratory:  Negative for chest tightness, cough and shortness of breath.   Cardiovascular: Negative for chest pain and leg swelling.  Gastrointestinal: Negative for abdominal distention and abdominal pain.  Endocrine: Negative for hot flashes.  Genitourinary: Negative for difficulty urinating, dysuria and frequency.   Musculoskeletal: Negative for arthralgias.  Skin: Negative for itching and rash.  Neurological: Negative for light-headedness and numbness.  Hematological: Negative for adenopathy. Does not bruise/bleed easily.  Psychiatric/Behavioral: Negative for confusion.    MEDICAL HISTORY:  Past Medical History:  Diagnosis Date  . Acute encephalopathy 12/08/2014  . Anxiety   . ARF (acute renal failure) ([place]) 12/08/2014  . Back pain   . Benign neoplasm of large bowel   . Capsulitis    fractured displaced metatarsal with capsulitis  . Chronic back pain   . Coronavirus infection   . Depression   . Diabetes mellitus without complication (HCC)    no medications currently  . Dysphagia   . Exostosis    painful, right hallux  . Foot drop, left    wears a brace  . Frequent falls 02/2018   poor balance, foot drop  . GERD (gastroesophageal reflux disease)   . Gout   . Hypertension   . Hypothyroidism   . Insomnia   . Low testosterone   . Microscopic hematuria 2016  . Myocardial infarction [facility])    patient unaware when it happened years ago.    . Pneumonia 12/07/2014  . Pressure ulcer 12/09/2014  . Sepsis ([place]) 12/08/2014  . Thyroid disease     SURGICAL HISTORY: Past Surgical History:  Procedure Laterality Date  . APPENDECTOMY    . [place]   x 2  .  CHOLECYSTECTOMY    . ESOPHAGOGASTRODUODENOSCOPY (EGD) WITH PROPOFOL N/A 04/24/2017   Procedure: ESOPHAGOGASTRODUODENOSCOPY (EGD) WITH PROPOFOL;  Surgeon: Manya Silvas, MD;  Location: [facility] ENDOSCOPY;  Service: Endoscopy;  Laterality: N/A;  . EYE SURGERY Bilateral 1983, 1985   cataract extractions  . SPINAL CORD  STIMULATOR INSERTION N/A 03/13/2018   Procedure: SPINAL CORD STIMULATOR INSERTION;  Surgeon: Meade Maw, MD;  Location: [facility] ORS;  Service: Neurosurgery;  Laterality: N/A;    SOCIAL HISTORY: Social History   Socioeconomic History  . Marital status: Married    Spouse name: dorothy  . Number of children: Not on file  . Years of education: Not on file  . Highest education level: Not on file  Occupational History  . Occupation: maintenance / repair    Comment: disabled  Tobacco Use  . Smoking status: Current Every Day Smoker    Packs/day: 0.50    Years: 70.00    Pack years: 35.00    Types: Cigars  . Smokeless tobacco: Never Used  . Tobacco comment: unable to give cessation materials due to webex visit   Substance and Sexual Activity  . Alcohol use: No  . Drug use: Yes    Types: Oxycodone  . Sexual activity: Not Currently  Other Topics Concern  . Not on file  Social History Narrative  . Not on file   Social Determinants of Health   Financial Resource Strain:   . Difficulty of Paying Living Expenses: Not on file  Food Insecurity:   . Worried About Charity fundraiser in the Last Year: Not on file  . Ran Out of Food in the Last Year: Not on file  Transportation Needs:   . Lack of Transportation (Medical): Not on file  . Lack of Transportation (Non-Medical): Not on file  Physical Activity:   . Days of Exercise per Week: Not on file  . Minutes of Exercise per Session: Not on file  Stress:   . Feeling of Stress : Not on file  Social Connections:   . Frequency of Communication with Friends and Family: Not on file  . Frequency of Social Gatherings with Friends and Family: Not on file  . Attends Religious Services: Not on file  . Active Member of Clubs or Organizations: Not on file  . Attends Archivist Meetings: Not on file  . Marital Status: Not on file  Intimate Partner Violence:   . Fear of Current or Ex-Partner: Not on file  . Emotionally Abused: Not  on file  . Physically Abused: Not on file  . Sexually Abused: Not on file    FAMILY HISTORY: Family History  Problem Relation Age of Onset  . Heart disease Mother   . Diabetes Father   . Kidney cancer Brother     ALLERGIES:  is allergic to ibuprofen and sulfa antibiotics.  MEDICATIONS:  Current Outpatient Medications  Medication Sig Dispense Refill  . allopurinol (ZYLOPRIM) 100 MG tablet Take 100 mg by mouth daily.  0  . amLODipine (NORVASC) 10 MG tablet Take 10 mg by mouth daily.    [name] aspirin 81 MG tablet Take 81 mg by mouth daily.    [name] atorvastatin (LIPITOR) 20 MG tablet Take 20 mg by mouth daily.   0  . citalopram (CELEXA) 40 MG tablet Take 40 mg by mouth daily.     . fluticasone (FLONASE) 50 MCG/ACT nasal spray Place 1 spray into both nostrils daily as needed for rhinitis.     [name] gabapentin (  NEURONTIN) 600 MG tablet Take 1 tablet (600 mg total) by mouth 3 (three) times daily. (Patient taking differently: Take 600 mg by mouth 2 (two) times daily. ) 90 tablet 5  . isosorbide mononitrate (IMDUR) 30 MG 24 hr tablet Take 30 mg by mouth daily.    [name] levothyroxine (SYNTHROID, LEVOTHROID) 125 MCG tablet Take 125 mcg by mouth daily before breakfast.   0  . linaclotide (LINZESS) 290 MCG CAPS capsule Take 290 mcg by mouth daily as needed (for constipation).     [name] losartan (COZAAR) 100 MG tablet Take 100 mg by mouth daily.     . mirabegron ER (MYRBETRIQ) 25 MG TB24 tablet Take 25 mg by mouth daily.    . naloxone (NARCAN) nasal spray 4 mg/0.1 mL Spray into one nostril. Repeat with second device into other nostril after 2-3 minutes if no or minimal response. Use in case of opioid overdose. (Patient taking differently: Place 1 spray into the nose once. Spray into one nostril. Repeat with second device into other nostril after 2-3 minutes if no or minimal response. Use in case of opioid overdose.) 1 kit 0  . ondansetron (ZOFRAN ODT) 4 MG disintegrating tablet Take 1 tablet (4 mg total) by mouth  every 8 (eight) hours as needed for nausea or vomiting. 20 tablet 0  . oxyCODONE (ROXICODONE) 15 MG immediate release tablet Take 1 tablet (15 mg total) by mouth every 6 (six) hours as needed for pain. Must last 30 days 120 tablet 0  . pantoprazole (PROTONIX) 40 MG tablet Take 40 mg by mouth daily.     . Testosterone (ANDROGEL) 40.5 MG/2.5GM (1.62%) GEL Place 2 Pump onto the skin 3 (three) times a week.    . vitamin B-12 (CYANOCOBALAMIN) 1000 MCG tablet Take 1,000 mcg by mouth daily.    . vitamin E 400 UNIT capsule Take 400 Units by mouth daily.     No current facility-administered medications for this visit.     PHYSICAL EXAMINATION: ECOG PERFORMANCE STATUS: 1 - Symptomatic but completely ambulatory Vitals:   02/17/19 1027  BP: 108/71  Pulse: 65  Resp: 16  Temp: 98.6 F (37 C)   Filed Weights   02/17/19 1027  Weight: 197 lb 11.2 oz (89.7 kg)    Physical Exam Constitutional:      General: He is not in acute distress.    Comments: Walk in with walker.  HENT:     Head: Normocephalic and atraumatic.  Eyes:     General: No scleral icterus.    Pupils: Pupils are equal, round, and reactive to light.  Cardiovascular:     Rate and Rhythm: Normal rate and regular rhythm.     Heart sounds: Normal heart sounds.  Pulmonary:     Effort: Pulmonary effort is normal. No respiratory distress.     Breath sounds: No wheezing.  Abdominal:     General: Bowel sounds are normal. There is no distension.     Palpations: Abdomen is soft. There is no mass.     Tenderness: There is no abdominal tenderness.  Musculoskeletal:        General: No deformity. Normal range of motion.     Cervical back: Normal range of motion and neck supple.  Skin:    General: Skin is warm and dry.     Findings: No erythema or rash.  Neurological:     Mental Status: He is alert and oriented to person, place, and time.     Cranial Nerves: No  cranial nerve deficit.     Coordination: Coordination normal.    Psychiatric:        Behavior: Behavior normal.        Thought Content: Thought content normal.      LABORATORY DATA:  I have reviewed the data as listed Lab Results  Component Value Date   WBC 6.1 02/17/2019   HGB 14.7 02/17/2019   HCT 46.1 02/17/2019   MCV 91.8 02/17/2019   PLT 180 02/17/2019   Recent Labs    07/24/18 1058 11/28/18 1009 01/01/19 1719 01/27/19 1533 01/28/19 0446  NA 141 139 140 138 140  K 4.0 4.0 4.5 4.2 4.0  CL 105 102 104 103 105  CO2 '28 27 29 26 25  ' GLUCOSE 103* 141* 112* 137* 189*  BUN 17 26* '19 20 17  ' CREATININE 1.19 1.32* 1.18 1.24 0.90  CALCIUM 9.0 9.0 8.6* 8.6* 8.9  GFRNONAA 57* 50* 57* 54* >60  GFRAA >60 58* >60 >60 >60  PROT 7.6 7.2  --   --   --   ALBUMIN 4.1 3.9  --   --   --   AST 17 75*  --   --   --   ALT 12 20  --   --   --   ALKPHOS 98 134*  --   --   --   BILITOT 0.7 1.5*  --   --   --    Iron/TIBC/Ferritin/ %Sat No results found for: IRON, TIBC, FERRITIN, IRONPCTSAT   RADIOGRAPHIC STUDIES: I have personally reviewed the radiological images as listed and agreed with the findings in the report. DG Chest 2 View  Result Date: 01/27/2019 CLINICAL DATA:  85 year old male with fall. EXAM: CHEST - 2 VIEW COMPARISON:  Chest radiograph dated 11/28/2018. FINDINGS: There is no focal consolidation, pleural effusion, or pneumothorax. The cardiac silhouette is within normal limits. Atherosclerotic calcification of the aortic arch. Midthoracic spinal stimulator. No acute osseous pathology. IMPRESSION: No active cardiopulmonary disease. Electronically Signed   By: Anner Crete M.D.   On: 01/27/2019 18:51   CT HEAD WO CONTRAST  Result Date: 01/28/2019 CLINICAL DATA:  Ataxia.  Fall. EXAM: CT HEAD WITHOUT CONTRAST TECHNIQUE: Contiguous axial images were obtained from the base of the skull through the vertex without intravenous contrast. COMPARISON:  01/27/2019 FINDINGS: Brain: Mild age related volume loss. No acute intracranial abnormality.  Specifically, no hemorrhage, hydrocephalus, mass lesion, acute infarction, or significant intracranial injury. Vascular: No hyperdense vessel or unexpected calcification. Skull: No acute calvarial abnormality. Sinuses/Orbits: Visualized paranasal sinuses and mastoids clear. Orbital soft tissues unremarkable. Other: None IMPRESSION: No acute intracranial abnormality. Electronically Signed   By: Rolm Baptise M.D.   On: 01/28/2019 11:22   CT Head Wo Contrast  Result Date: 01/27/2019 CLINICAL DATA:  85 year old male status post multiple falls. Struck head today. Recently started on blood pressure medications. EXAM: CT HEAD WITHOUT CONTRAST TECHNIQUE: Contiguous axial images were obtained from the base of the skull through the vertex without intravenous contrast. COMPARISON:  Head CT 01/01/2019. FINDINGS: Brain: Stable cerebral volume. No midline shift, ventriculomegaly, mass effect, evidence of mass lesion, intracranial hemorrhage or evidence of cortically based acute infarction. Stable gray-white matter differentiation throughout the brain. Vascular: Calcified atherosclerosis at the skull base. No suspicious intracranial vascular hyperdensity. Skull: No fracture identified. Sinuses/Orbits: Visualized paranasal sinuses and mastoids are stable and well pneumatized. Other: No scalp hematoma identified. Orbits soft tissues appears stable. IMPRESSION: No acute intracranial abnormality or acute traumatic injury identified. Electronically  Signed   By: Genevie Ann M.D.   On: 01/27/2019 16:18   CT Cervical Spine Wo Contrast  Result Date: 01/27/2019 CLINICAL DATA:  85 year old male status post multiple falls. Struck head today. Recently started on blood pressure medications. EXAM: CT CERVICAL SPINE WITHOUT CONTRAST TECHNIQUE: Multidetector CT imaging of the cervical spine was performed without intravenous contrast. Multiplanar CT image reconstructions were also generated. COMPARISON:  Head CT today reported separately.  Cervical spine CT 01/01/2019. FINDINGS: Alignment: Stable straightening of cervical lordosis. Cervicothoracic junction alignment is within normal limits. Bilateral posterior element alignment is within normal limits. Skull base and vertebrae: Visualized skull base is intact. No atlanto-occipital dissociation. No acute osseous abnormality identified. Soft tissues and spinal canal: No prevertebral fluid or swelling. No visible canal hematoma. Stable visible noncontrast neck soft tissues. Disc levels: Widespread advanced cervical spine degeneration appears stable, including at the anterior C1-C2 articulation. Upper chest: Visible upper thoracic levels appear intact. Negative lung apices. IMPRESSION: No acute traumatic injury identified in the cervical spine. Electronically Signed   By: Genevie Ann M.D.   On: 01/27/2019 16:21   DG HIP UNILAT WITH PELVIS 2-3 VIEWS LEFT  Result Date: 01/27/2019 CLINICAL DATA:  Fall EXAM: DG HIP (WITH OR WITHOUT PELVIS) 2-3V LEFT COMPARISON:  11/28/2018 FINDINGS: Alignment appears anatomic. There is no acute fracture identified. Joint space is preserved. Degenerative changes of the lower lumbar spine. Stimulator generator is identified overlying the left pelvis. IMPRESSION: No acute fracture. Electronically Signed   By: Macy Mis M.D.   On: 01/27/2019 18:50      ASSESSMENT & PLAN:  1. Thrombocytopenia ([place])    Labs reviewed and discussed with patient and his wife. Reticulocyte count normalized 180. Patient has had work-up done includes negative hepatitis, adequate B12 and folate, fatty liver disease no splenomegaly on ultrasound abdomen. Mild degree of thrombocytopenia, possible ITP, cannot rule out underlying MDS. Given his age, mild degree of thrombocytopenia, recommend continue to monitor. Recommend patient to follow-up with me in 1 year.  Orders Placed This Encounter  Procedures  . CBC with Differential    Standing Status:   Future    Standing Expiration Date:    02/16/2021  . Comprehensive metabolic panel    Standing Status:   Future    Standing Expiration Date:   02/16/2021    All questions were answered. The patient knows to call the clinic with any problems questions or concerns.  Return of visit 1 year We spent sufficient time to discuss many aspect of care, questions were answered to patient's satisfaction. Earlie Server, MD, PhD Hematology Oncology [facility] Pager- [phone] 02/17/2019

## 2019-02-17 NOTE — Progress Notes (Signed)
Patient does not offer any problems today.  

## 2019-03-17 ENCOUNTER — Telehealth: Payer: Medicare Other | Admitting: Pain Medicine

## 2019-03-24 ENCOUNTER — Ambulatory Visit: Payer: Medicare HMO | Attending: Internal Medicine

## 2019-03-24 ENCOUNTER — Other Ambulatory Visit: Payer: Self-pay

## 2019-03-24 DIAGNOSIS — Z23 Encounter for immunization: Secondary | ICD-10-CM | POA: Insufficient documentation

## 2019-03-24 NOTE — Progress Notes (Signed)
   Covid-19 Vaccination Clinic  Name:  Chase Mendez    MRN: [id number] DOB: 07/09/1934  03/24/2019  Mr. [name] was observed post Covid-19 immunization for 15 minutes without incidence. He was provided with Vaccine Information Sheet and instruction to access the V-Safe system.   Mr. [name] was instructed to call 911 with any severe reactions post vaccine: [name] Difficulty breathing  . Swelling of your face and throat  . A fast heartbeat  . A bad rash all over your body  . Dizziness and weakness    Immunizations Administered    Name Date Dose VIS Date Route   Pfizer COVID-19 Vaccine 03/24/2019  9:32 AM 0.3 mL 01/17/2019 Intramuscular   Manufacturer: [place]   Lot: [id number]   [place]: [id number]

## 2019-04-15 ENCOUNTER — Encounter: Payer: Self-pay | Admitting: Pain Medicine

## 2019-04-15 NOTE — Progress Notes (Signed)
Patient: Chase Mendez  Service Category: E/M  Provider: Gaspar Cola, MD  DOB: 1934/12/23  DOS: 04/16/2019  Location: Office  MRN: [id number]  Setting: Ambulatory outpatient  Referring Provider: Jodi Marble, MD  Type: Established Patient  Specialty: Interventional Pain Management  PCP: Chase Marble, MD  Location: Remote location  Delivery: TeleHealth     Virtual Encounter - Pain Management PROVIDER NOTE: Information contained herein reflects review and annotations entered in association with encounter. Interpretation of such information and data should be left to medically-trained personnel. Information provided to patient can be located elsewhere in the medical record under "Patient Instructions". Document created using STT-dictation technology, any transcriptional errors that may result from process are unintentional.    Contact & Pharmacy Preferred: [phone] Home: [phone] (home) Mobile: There is no such number on file (mobile). E-mail: Chase Mendez  [facility] - [place], [place] - [address] [place] [address] Phone: [phone] Fax: [phone]  [id number] [id number]: [phone]: [phone]  Pre-screening  [name] "in-person" vs "virtual" encounter. He indicated preferring virtual for this encounter.   Reason COVID-19*  Social distancing based on CDC and AMA recommendations.   I contacted Chase Mendez 04/16/2019 via telephone.      I clearly identified myself as FGaspar Cola MD. I verified that I was speaking with the correct person using two identifiers (Name: Chase Mendez and date of birth: [birth year].  Consent I sought verbal advanced consent from Chase Ferrierfor virtual visit interactions. I informed Chase Mendez possible security and privacy concerns, risks, and limitations associated with  providing "not-in-person" medical evaluation and management services. I also informed [name] the availability of "in-person" appointments. Finally, I informed him that there would be a charge for the virtual visit and that he could be  personally, fully or partially, financially responsible for it. Chase Mendez understanding and agreed to proceed.   Historic Elements   Mr. HCORTAVIUS MONTESINOSis a 85y.o. year old, male patient evaluated today after his last contact with our practice on 01/28/2019. Chase Mendez has a past medical history of Acute encephalopathy (12/08/2014), Anxiety, ARF (acute renal failure) (Chase Mendez (12/08/2014), Back pain, Benign neoplasm of large bowel, Capsulitis, Chronic back pain, Coronavirus infection, Depression, Diabetes mellitus without complication ([place], Dysphagia, Exostosis, Foot drop, left, Frequent falls (02/2018), GERD (gastroesophageal reflux disease), Gout, Hypertension, Hypothyroidism, Insomnia, Low testosterone, Microscopic hematuria (2016), [place]ocardial infarction ([place], Pneumonia (12/07/2014), Pressure ulcer (12/09/2014), Sepsis ([place] (12/08/2014), and Thyroid disease. He also  has a past surgical history that includes Cholecystectomy; Esophagogastroduodenoscopy (egd) with propofol (N/A, 04/24/2017); Back surgery (1995, 1996); Eye surgery (Bilateral, 1983, 1985); Appendectomy; and Spinal cord stimulator insertion (N/A, 03/13/2018). Mr. RHeideckerhas a current medication list which includes the following prescription(s): allopurinol, amlodipine, aspirin, atorvastatin, citalopram, fluticasone, gabapentin, isosorbide mononitrate, levothyroxine, linaclotide, losartan, mirabegron er, naloxone, ondansetron, testosterone, vitamin b-12, vitamin e, [START ON 04/28/2019] oxycodone, [START ON 05/28/2019] oxycodone, [START ON 06/27/2019] oxycodone, pantoprazole, [DISCONTINUED] diphenhydramine, and [DISCONTINUED] tizanidine. He  reports that he has been smoking cigars. He has a 35.00 pack-year  smoking history. He has never used smokeless tobacco. He reports current drug use. Drug: Oxycodone. He reports that he does not drink alcohol. Chase Mendez allergic to ibuprofen and sulfa antibiotics.   HPI  Today, he is being contacted for medication management.  The patient was recently admitted to the ED due to having several falls and ataxia.  He went to a full work-up and they came up with the conclusion that this was secondary to his medication use.  Apparently he had been adding Benadryl to his regimen, as well as taking the Zanaflex at the same time as the oxycodone.  Today I had a very long conversation with the patient's wife about this.  They have discontinued the use of the Benadryl and the muscle relaxant (Zanaflex/tizanidine).  With regards to the gabapentin 600 mg, he has been using 1 tablet p.o. 3 times daily.  With this regimen he seems to be doing better.  He also indicates doing well with the spinal cord stimulator and not having as much pain as he used to have.  I have known this patient for quite some time and I think that part of the problems that were having is that he may be having some unrealistic expectations trying to be 100% pain-free with the use of medications, which has led to taking an unusually large amount of medicine.  Proved for this is the fact that he has been experiencing ataxia and lower extremity weakness, which normally would not happen if you are taking just enough medication to get some of the pain under control, without trying to completely eliminated.  In any case, today we talked about proper monitoring of his medication use and I have also offered to begin tapering his opioids down to a point where we can perhaps get rid of them for a while.  He has been on opioids for a long time and I believe him to be dependent on the medication, perhaps not entirely due to the pain.  I think that the wife realizes this and therefore I am hoping that the 2 of them will talk  about this option so that we can start this downward taper.  Pharmacotherapy Assessment  Analgesic: Oxycodone IR 15 mg, 1 tab PO q 6 hrs. (60 mg/day of oxycodone) MME/day:51m/day.   Monitoring: [place] PMP: PDMP reviewed during this encounter.       Pharmacotherapy: No side-effects or adverse reactions reported. Compliance: No problems identified. Effectiveness: Clinically acceptable. Plan: Refer to "POC".  UDS:  Summary  Date Value Ref Range Status  11/14/2017 FINAL  Final    Comment:    ==================================================================== TOXASSURE SELECT 13 (MW) ==================================================================== Test                             Result       Flag       Units Drug Present and Declared for Prescription Verification   Oxycodone                      4937         EXPECTED   ng/mg creat   Oxymorphone                    1650         EXPECTED   ng/mg creat   Noroxycodone                   6664         EXPECTED   ng/mg creat   Noroxymorphone                 520  EXPECTED   ng/mg creat    Sources of oxycodone are scheduled prescription medications.    Oxymorphone, noroxycodone, and noroxymorphone are expected    metabolites of oxycodone. Oxymorphone is also available as a    scheduled prescription medication. ==================================================================== Test                      Result    Flag   Units      Ref Range   Creatinine              139              mg/dL      >=20 ==================================================================== Declared Medications:  The flagging and interpretation on this report are based on the  following declared medications.  Unexpected results may arise from  inaccuracies in the declared medications.  **Note: The testing scope of this panel includes these medications:  Oxycodone  **Note: The testing scope of this panel does not include following  reported  medications:  Allopurinol  Aspirin (Aspirin 81)  Atorvastatin  Citalopram  Cyanocobalamin  Diphenhydramine  Fluticasone  Gabapentin  Isosorbide  Levothyroxine  Linaclotide  Losartan (Losartan Potassium)  Mirabegron  Naloxone  Omeprazole  Ondansetron  Testosterone  Vitamin E ==================================================================== For clinical consultation, please call [phone]. ====================================================================    Laboratory Chemistry Profile   Renal Lab Results  Component Value Date   BUN 17 01/28/2019   CREATININE 0.90 01/28/2019   GFRAA >60 01/28/2019   GFRNONAA >60 01/28/2019    Hepatic Lab Results  Component Value Date   AST 75 (H) 11/28/2018   ALT 20 11/28/2018   ALBUMIN 3.9 11/28/2018   ALKPHOS 134 (H) 11/28/2018   HCVAB <0.1 07/24/2018   LIPASE 37 08/21/2016    Electrolytes Lab Results  Component Value Date   NA 140 01/28/2019   K 4.0 01/28/2019   CL 105 01/28/2019   CALCIUM 8.9 01/28/2019    Bone No results found for: VD25OH, VD125OH2TOT, DG6440HK7, QQ5956LO7, 25OHVITD1, 25OHVITD2, 25OHVITD3, TESTOFREE, TESTOSTERONE  Inflammation (CRP: Acute Phase) (ESR: Chronic Phase) Lab Results  Component Value Date   LATICACIDVEN 1.1 12/08/2014      Note: Above Lab results reviewed.  Imaging  CT HEAD WO CONTRAST CLINICAL DATA:  Ataxia.  Fall.  EXAM: CT HEAD WITHOUT CONTRAST  TECHNIQUE: Contiguous axial images were obtained from the base of the skull through the vertex without intravenous contrast.  COMPARISON:  01/27/2019  FINDINGS: Brain: Mild age related volume loss. No acute intracranial abnormality. Specifically, no hemorrhage, hydrocephalus, mass lesion, acute infarction, or significant intracranial injury.  Vascular: No hyperdense vessel or unexpected calcification.  Skull: No acute calvarial abnormality.  Sinuses/Orbits: Visualized paranasal sinuses and mastoids clear. Orbital  soft tissues unremarkable.  Other: None  IMPRESSION: No acute intracranial abnormality.  Electronically Signed   By: Rolm Baptise M.D.   On: 01/28/2019 11:22  Assessment  The primary encounter diagnosis was Chronic pain syndrome. Diagnoses of Chronic lower extremity pain (Primary Area of Pain) (Left), Chronic low back pain (Secondary Area of Pain) (Bilateral) (ML) (L>Chase) with sciatica (Left), and Failed back surgical syndrome were also pertinent to this visit.  Plan of Care  Problem-specific:  No problem-specific Assessment & Plan notes found for this encounter.  Mr. SHANARD TRETO has a current medication list which includes the following long-term medication(s): allopurinol, amlodipine, citalopram, fluticasone, gabapentin, isosorbide mononitrate, levothyroxine, linaclotide, losartan, mirabegron er, testosterone, [START ON 04/28/2019] oxycodone, [START ON 05/28/2019] oxycodone, [START ON  06/27/2019] oxycodone, pantoprazole, [DISCONTINUED] diphenhydramine, and [DISCONTINUED] tizanidine.  Pharmacotherapy (Medications Ordered): Meds ordered this encounter  Medications  . oxyCODONE (ROXICODONE) 15 MG immediate release tablet    Sig: Take 1 tablet (15 mg total) by mouth every 6 (six) hours as needed for pain. Must last 30 days    Dispense:  120 tablet    Refill:  0    Chronic Pain: STOP Act (Not applicable) Fill 1 day early if closed on refill date. Do not fill until: 04/28/2019. To last until: 05/28/2019. Avoid benzodiazepines within 8 hours of opioids  . oxyCODONE (ROXICODONE) 15 MG immediate release tablet    Sig: Take 1 tablet (15 mg total) by mouth every 6 (six) hours as needed for pain. Must last 30 days    Dispense:  120 tablet    Refill:  0    Chronic Pain: STOP Act (Not applicable) Fill 1 day early if closed on refill date. Do not fill until: 05/28/2019. To last until: 06/27/2019. Avoid benzodiazepines within 8 hours of opioids  . oxyCODONE (ROXICODONE) 15 MG immediate release tablet     Sig: Take 1 tablet (15 mg total) by mouth every 6 (six) hours as needed for pain. Must last 30 days    Dispense:  120 tablet    Refill:  0    Chronic Pain: STOP Act (Not applicable) Fill 1 day early if closed on refill date. Do not fill until: 06/27/2019. To last until: 07/27/2019. Avoid benzodiazepines within 8 hours of opioids   Orders:  No orders of the defined types were placed in this encounter.  Follow-up plan:   Return in about 14 weeks (around 07/23/2019) for (VV), (MM).      Interventional treatment options:  Under consideration:   Diagnostic Caudal ESI + epidurogram #1 Possible Racz procedure Diagnostic bilateral lumbar facet block Possible bilateral lumbar facet RFA Possible candidate for intrathecal pump trial and implant   Therapeutic/palliative (PRN):   Therapeutic/palliative left L4TFESI#2 Therapeutic/palliative left L5TFESI#2 Bilateral spinal cord stimulator trial (done - 01/17/2018)  Permanent bilateral spinal cord stimulator implant by Dr. Cari Caraway (neurosurgery) (done - 03/13/2018)    Recent Visits No visits were found meeting these conditions.  Showing recent visits within past 90 days and meeting all other requirements   Today's Visits Date Type Provider Dept  04/16/19 Telemedicine Milinda Pointer, MD [facility]  Showing today's visits and meeting all other requirements   Future Appointments No visits were found meeting these conditions.  Showing future appointments within next 90 days and meeting all other requirements   I discussed the assessment and treatment plan with the patient. The patient was provided an opportunity to ask questions and all were answered. The patient agreed with the plan and demonstrated an understanding of the instructions.  Patient advised to call back or seek an in-person evaluation if the symptoms or condition worsens.  Duration of encounter: 18 minutes.  Note by: Gaspar Cola, MD Date:  04/16/2019; Time: 11:08 AM

## 2019-04-16 ENCOUNTER — Emergency Department
Admission: EM | Admit: 2019-04-16 | Discharge: 2019-04-16 | Disposition: A | Discharge status: Home / Self Care | Payer: Worker's Compensation | Attending: Emergency Medicine | Admitting: Emergency Medicine

## 2019-04-16 ENCOUNTER — Ambulatory Visit: Payer: Medicare HMO | Attending: Pain Medicine | Admitting: Pain Medicine

## 2019-04-16 ENCOUNTER — Other Ambulatory Visit: Payer: Self-pay

## 2019-04-16 DIAGNOSIS — Y92009 Unspecified place in unspecified non-institutional (private) residence as the place of occurrence of the external cause: Secondary | ICD-10-CM | POA: Insufficient documentation

## 2019-04-16 DIAGNOSIS — I10 Essential (primary) hypertension: Secondary | ICD-10-CM | POA: Insufficient documentation

## 2019-04-16 DIAGNOSIS — Y939 Activity, unspecified: Secondary | ICD-10-CM | POA: Insufficient documentation

## 2019-04-16 DIAGNOSIS — F17290 Nicotine dependence, other tobacco product, uncomplicated: Secondary | ICD-10-CM | POA: Insufficient documentation

## 2019-04-16 DIAGNOSIS — M5442 Lumbago with sciatica, left side: Secondary | ICD-10-CM | POA: Diagnosis not present

## 2019-04-16 DIAGNOSIS — Z79899 Other long term (current) drug therapy: Secondary | ICD-10-CM | POA: Diagnosis not present

## 2019-04-16 DIAGNOSIS — M961 Postlaminectomy syndrome, not elsewhere classified: Secondary | ICD-10-CM

## 2019-04-16 DIAGNOSIS — E039 Hypothyroidism, unspecified: Secondary | ICD-10-CM | POA: Diagnosis not present

## 2019-04-16 DIAGNOSIS — M79605 Pain in left leg: Secondary | ICD-10-CM

## 2019-04-16 DIAGNOSIS — E119 Type 2 diabetes mellitus without complications: Secondary | ICD-10-CM | POA: Diagnosis not present

## 2019-04-16 DIAGNOSIS — Z8616 Personal history of COVID-19: Secondary | ICD-10-CM | POA: Insufficient documentation

## 2019-04-16 DIAGNOSIS — Z9049 Acquired absence of other specified parts of digestive tract: Secondary | ICD-10-CM | POA: Diagnosis not present

## 2019-04-16 DIAGNOSIS — I252 Old myocardial infarction: Secondary | ICD-10-CM | POA: Diagnosis not present

## 2019-04-16 DIAGNOSIS — W0110XA Fall on same level from slipping, tripping and stumbling with subsequent striking against unspecified object, initial encounter: Secondary | ICD-10-CM | POA: Insufficient documentation

## 2019-04-16 DIAGNOSIS — G894 Chronic pain syndrome: Secondary | ICD-10-CM

## 2019-04-16 DIAGNOSIS — S51811A Laceration without foreign body of right forearm, initial encounter: Secondary | ICD-10-CM

## 2019-04-16 DIAGNOSIS — Z7982 Long term (current) use of aspirin: Secondary | ICD-10-CM | POA: Insufficient documentation

## 2019-04-16 DIAGNOSIS — Y999 Unspecified external cause status: Secondary | ICD-10-CM | POA: Insufficient documentation

## 2019-04-16 DIAGNOSIS — Z9682 Presence of neurostimulator: Secondary | ICD-10-CM | POA: Insufficient documentation

## 2019-04-16 DIAGNOSIS — G8929 Other chronic pain: Secondary | ICD-10-CM

## 2019-04-16 MED ORDER — OXYCODONE HCL 15 MG PO TABS: 15.0000 mg | ORAL_TABLET | Freq: Four times a day (QID) | ORAL | 0 refills | Status: DC | PRN

## 2019-04-16 MED ORDER — LIDOCAINE HCL (PF) 1 % IJ SOLN
5.0000 mL | Freq: Once | INTRAMUSCULAR | Status: DC
  Filled 2019-04-16: qty 5

## 2019-04-16 NOTE — Discharge Instructions (Addendum)
The suture should be removed in 7 days.  Return the emergency department, see your regular doctor, or remove them yourself.  Keep the area as dry as possible.  The Steri-Strips will peel off on their own.

## 2019-04-16 NOTE — ED Triage Notes (Signed)
Pt from home states when he stood up and his right leg slipped, he tried to hold on to rolling chair which slid away from him. Pt fell, co lac to right forearm. Pt denies any neck or back pain , no head injury. Pt denies any dizziness or weakness.

## 2019-04-16 NOTE — ED Notes (Signed)
See triage note. Pt fell on right side while standing up from dinner table. Chair slipped and pt noticed laceration to right forearm. Upon assessment, large jagged edged laceration noted on right forearm. Approx 3cm in length and 1cm in width. Bleeding appears controlled. Distal pulses intact, cap refill <3 sec.

## 2019-04-16 NOTE — ED Provider Notes (Signed)
[facility] Emergency Department Provider Note  ____________________________________________   First MD Initiated Contact with Patient 04/16/19 1952     (approximate)  I have reviewed the triage vital signs and the nursing notes.   HISTORY  Chief Complaint Fall    HPI Chase Mendez is a 85 y.o. male presents emergency department with a skin tear to the right forearm.  Patient states that he got up from the dinner table tonight and the chair slipped causing him to fall.  He is not complaining of any back pain, hip pain, neck pain, lower extremity pain.  He denies any LOC.  He denies head injury.  Tdap is up-to-date.  Patient does have frequent falls at home.    Past Medical History:  Diagnosis Date  . Acute encephalopathy 12/08/2014  . Anxiety   . ARF (acute renal failure) ([place]) 12/08/2014  . Back pain   . Benign neoplasm of large bowel   . Capsulitis    fractured displaced metatarsal with capsulitis  . Chronic back pain   . Coronavirus infection   . Depression   . Diabetes mellitus without complication (HCC)    no medications currently  . Dysphagia   . Exostosis    painful, right hallux  . Foot drop, left    wears a brace  . Frequent falls 02/2018   poor balance, foot drop  . GERD (gastroesophageal reflux disease)   . Gout   . Hypertension   . Hypothyroidism   . Insomnia   . Low testosterone   . Microscopic hematuria 2016  . Myocardial infarction [facility])    patient unaware when it happened years ago.    . Pneumonia 12/07/2014  . Pressure ulcer 12/09/2014  . Sepsis ([place]) 12/08/2014  . Thyroid disease     Patient Active Problem List   Diagnosis Date Noted  . Delirium 01/27/2019  . Gout of left foot due to renal impairment 12/16/2018  . Presence of neurostimulator 10/22/2018  . Spinal cord stimulator dysfunction ([place]) 10/22/2018  . Encounter for interrogation of neurostimulator 10/22/2018  . Chronic musculoskeletal pain 09/11/2018  .  Neurogenic pain 09/11/2018  . Claustrophobia 01/23/2018  . Thoracic central spinal stenosis 01/22/2018  . Cellulitis of right hand 12/06/2017  . Chronic pain syndrome 07/11/2017  . DDD (degenerative disc disease), lumbar 06/19/2017  . Opioid Drug tolerance 06/10/2017  . History of acute renal failure (12/08/2014) 06/10/2017  . History of encephalopathy (12/08/2014) 06/10/2017  . History of sepsis (12/08/2014) 06/10/2017  . Lower limb pain, inferior (L5) (Foot Drop) (Left) 05/16/2017  . Chronic low back pain (Secondary Area of Pain) (Bilateral) (ML) (L>R) with sciatica (Left) 05/16/2017  . Chronic lower extremity pain (Primary Area of Pain) (Left) 05/16/2017  . 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 Foot drop (Left) 05/16/2017  . Lumbosacral radiculopathy at L5 (Left) 05/16/2017  . Disorder of skeletal system 05/16/2017  . Pharmacologic therapy 05/16/2017  . Problems influencing health status 05/16/2017  . Venous reflux 07/12/2016  . 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 Varicose veins of both lower extremities with pain 05/09/2016  . ARF (acute renal failure) ([place]) 12/08/2014  . Pneumonia 12/07/2014    Past Surgical History:  Procedure Laterality Date  . APPENDECTOMY    . [place]   x 2  . CHOLECYSTECTOMY    . ESOPHAGOGASTRODUODENOSCOPY (EGD) WITH PROPOFOL N/A 04/24/2017   Procedure: ESOPHAGOGASTRODUODENOSCOPY (EGD) WITH PROPOFOL;  Surgeon: Manya Silvas, MD;  Location: [facility] ENDOSCOPY;  Service: Endoscopy;  Laterality: N/A;  . EYE SURGERY Bilateral 1983, 1985   cataract extractions  . SPINAL CORD STIMULATOR INSERTION N/A 03/13/2018   Procedure: SPINAL CORD STIMULATOR INSERTION;  Surgeon: Meade Maw, MD;  Location: [facility] ORS;  Service: Neurosurgery;  Laterality: N/A;    Prior to Admission medications   Medication Sig Start Date End Date Taking? Authorizing Provider  allopurinol (ZYLOPRIM) 100 MG tablet Take 100 mg by mouth daily. 10/26/14    [provider]  amLODipine (NORVASC) 10 MG tablet Take 10 mg by mouth daily.    [provider]  aspirin 81 MG tablet Take 81 mg by mouth daily.    [provider]  atorvastatin (LIPITOR) 20 MG tablet Take 20 mg by mouth daily.     [provider]  citalopram (CELEXA) 40 MG tablet Take 40 mg by mouth daily.     [provider]  fluticasone (FLONASE) 50 MCG/ACT nasal spray Place 1 spray into both nostrils daily as needed for rhinitis.     [provider]  gabapentin (NEURONTIN) 600 MG tablet Take 1 tablet (600 mg total) by mouth 3 (three) times daily. Patient taking differently: Take 600 mg by mouth 2 (two) times daily.  01/28/19 07/27/19  Lavina Hamman, MD  isosorbide mononitrate (IMDUR) 30 MG 24 hr tablet Take 30 mg by mouth daily.    [provider]  levothyroxine (SYNTHROID, LEVOTHROID) 125 MCG tablet Take 125 mcg by mouth daily before breakfast.     [provider]  linaclotide (LINZESS) 290 MCG CAPS capsule Take 290 mcg by mouth daily as needed (for constipation).     [provider]  losartan (COZAAR) 100 MG tablet Take 100 mg by mouth daily.     [provider]  mirabegron ER (MYRBETRIQ) 25 MG TB24 tablet Take 25 mg by mouth daily.    [provider]  naloxone [facility]) nasal spray 4 mg/0.1 mL Spray into one nostril. Repeat with second device into other nostril after 2-3 minutes if no or minimal response. Use in case of opioid overdose. Patient taking differently: Place 1 spray into the nose once. Spray into one nostril. Repeat with second device into other nostril after 2-3 minutes if no or minimal response. Use in case of opioid overdose. 09/04/17   Vevelyn Francois, NP  ondansetron (ZOFRAN ODT) 4 MG disintegrating tablet Take 1 tablet (4 mg total) by mouth every 8 (eight) hours as needed for nausea or vomiting. 08/21/16   Darel Hong, MD  oxyCODONE (ROXICODONE) 15 MG immediate release tablet  Take 1 tablet (15 mg total) by mouth every 6 (six) hours as needed for pain. Must last 30 days 04/28/19 05/28/19  Milinda Pointer, MD  oxyCODONE (ROXICODONE) 15 MG immediate release tablet Take 1 tablet (15 mg total) by mouth every 6 (six) hours as needed for pain. Must last 30 days 05/28/19 06/27/19  Milinda Pointer, MD  oxyCODONE (ROXICODONE) 15 MG immediate release tablet Take 1 tablet (15 mg total) by mouth every 6 (six) hours as needed for pain. Must last 30 days 06/27/19 07/27/19  Milinda Pointer, MD  pantoprazole (PROTONIX) 40 MG tablet Take 40 mg by mouth daily.  02/04/18 02/17/19  [provider]  Testosterone (ANDROGEL) 40.5 MG/2.5GM (1.62%) GEL Place 2 Pump onto the skin 3 (three) times a week.    [provider]  vitamin B-12 (CYANOCOBALAMIN) 1000 MCG tablet Take 1,000 mcg by mouth daily.    [provider]  vitamin E  400 UNIT capsule Take 400 Units by mouth daily.    [provider]  diphenhydrAMINE (BENADRYL) 25 MG tablet Take 25 mg by mouth 2 (two) times daily.   01/27/19  [provider]  tiZANidine (ZANAFLEX) 4 MG capsule Take 1 capsule (4 mg total) by mouth 3 (three) times daily. 12/16/18 01/27/19  Milinda Pointer, MD    Allergies Ibuprofen and Sulfa antibiotics  Family History  Problem Relation Age of Onset  . Heart disease Mother   . Diabetes Father   . Kidney cancer Brother     Social History Social History   Tobacco Use  . Smoking status: Current Every Day Smoker    Packs/day: 0.50    Years: 70.00    Pack years: 35.00    Types: Cigars  . Smokeless tobacco: Never Used  . Tobacco comment: unable to give cessation materials due to webex visit   Substance Use Topics  . Alcohol use: No  . Drug use: Yes    Types: Oxycodone    Review of Systems  Constitutional: No fever/chills Eyes: No visual changes. ENT: No sore throat. Respiratory: Denies cough Cardiovascular: Denies chest pain Genitourinary: Negative for  dysuria. Musculoskeletal: Negative for back pain. Skin: Negative for rash.  Positive for laceration to the right forearm Psychiatric: no mood changes,     ____________________________________________   PHYSICAL EXAM:  VITAL SIGNS: ED Triage Vitals  Enc Vitals Group     BP 04/16/19 1914 (!) 96/51     Pulse Rate 04/16/19 1911 (!) 59     Resp 04/16/19 1911 18     Temp 04/16/19 1911 98.2 F (36.8 C)     Temp Source 04/16/19 1911 Oral     SpO2 04/16/19 1911 97 %     Weight 04/16/19 1912 197 lb (89.4 kg)     Height 04/16/19 1912 6' (1.829 m)     Head Circumference --      Peak Flow --      Pain Score 04/16/19 1912 3     Pain Loc --      Pain Edu? --      Excl. in [place]? --     Constitutional: Alert and oriented. Well appearing and in no acute distress. Eyes: Conjunctivae are normal.  Head: Atraumatic. Nose: No congestion/rhinnorhea. Mouth/Throat: Mucous membranes are moist.   Neck:  supple no lymphadenopathy noted Cardiovascular: Normal rate, regular rhythm.  Respiratory: Normal respiratory effort.  No retractions,  GU: deferred Musculoskeletal: FROM all extremities, warm and well perfused, no spinal tenderness, no extremity tenderness, hips are nontender, neurovascular is intact Neurologic:  Normal speech and language.  Skin:  Skin is warm, dry , positive for 4 cm laceration along with skin tears to the right forearm, no foreign body. No rash noted. Psychiatric: Mood and affect are normal. Speech and behavior are normal.  ____________________________________________   LABS (all labs ordered are listed, but only abnormal results are displayed)  Labs Reviewed - No data to display ____________________________________________   ____________________________________________  RADIOLOGY    ____________________________________________   PROCEDURES  Procedure(s) performed:   Marland KitchenMarland KitchenLaceration Repair  Date/Time: 04/16/2019 9:44 PM Performed by: Versie Starks,  PA-C Authorized by: Versie Starks, PA-C   Consent:    Consent obtained:  Verbal   Consent given by:  Patient   Risks discussed:  Infection, pain, retained foreign body, need for additional repair, poor cosmetic result, tendon damage, nerve damage, poor wound healing and vascular damage Anesthesia (see MAR for exact dosages):  Anesthesia method:  Local infiltration   Local anesthetic:  Lidocaine 1% w/o epi Laceration details:    Location:  Shoulder/arm   Shoulder/arm location:  R lower arm   Length (cm):  4 Repair type:    Repair type:  Simple Pre-procedure details:    Preparation:  Patient was prepped and draped in usual sterile fashion Exploration:    Hemostasis achieved with:  Direct pressure   Wound exploration: wound explored through full range of motion     Wound extent: no foreign bodies/material noted and no underlying fracture noted   Treatment:    Area cleansed with:  Betadine and saline   Amount of cleaning:  Standard   Irrigation solution:  Sterile saline   Irrigation method:  Syringe and tap Skin repair:    Repair method:  Sutures and Steri-Strips   Suture size:  5-0   Suture material:  Nylon   Suture technique:  Simple interrupted   Number of sutures:  3 Approximation:    Approximation:  Close Post-procedure details:    Dressing:  Non-adherent dressing   Patient tolerance of procedure:  Tolerated well, no immediate complications      ____________________________________________   INITIAL IMPRESSION / ASSESSMENT AND PLAN / ED COURSE  Pertinent labs & imaging results that were available during my care of the patient were reviewed by me and considered in my medical decision making (see chart for details).   Patient is an 85 year old male presents emergency department with concerns of a laceration to the right forearm.  See HPI  Physical exam shows patient to appear well.  Right forearm laceration noted.  No bony tenderness is noted on any  extremities.  See procedure note for repair  Patient was discharged in stable condition with instructions on how to care for the Steri-Strips and sutures.  Suture should be removed in 7 to 10 days.  He states he understands and will comply.    Chase Mendez was evaluated in Emergency Department on 04/16/2019 for the symptoms described in the history of present illness. He was evaluated in the context of the global COVID-19 pandemic, which necessitated consideration that the patient might be at risk for infection with the SARS-CoV-2 virus that causes COVID-19. Institutional protocols and algorithms that pertain to the evaluation of patients at risk for COVID-19 are in a state of rapid change based on information released by regulatory bodies including the CDC and federal and state organizations. These policies and algorithms were followed during the patient's care in the ED.   As part of my medical decision making, I reviewed the following data within the [place] notes reviewed and incorporated, Old chart reviewed, Notes from prior ED visits and [place] Controlled Substance Database  ____________________________________________   FINAL CLINICAL IMPRESSION(S) / ED DIAGNOSES  Final diagnoses:  Laceration of right forearm, initial encounter      NEW MEDICATIONS STARTED DURING THIS VISIT:  New Prescriptions   No medications on file     Note:  This document was prepared using Dragon voice recognition software and may include unintentional dictation errors.    Versie Starks, PA-C 04/16/19 2146    Nance Pear, MD 04/16/19 [phone]

## 2019-04-22 ENCOUNTER — Ambulatory Visit: Payer: Medicare HMO | Attending: Internal Medicine

## 2019-04-22 DIAGNOSIS — Z23 Encounter for immunization: Secondary | ICD-10-CM

## 2019-04-22 NOTE — Progress Notes (Signed)
   Covid-19 Vaccination Clinic  Name:  [name]    MRN: [id number] DOB: 08-14-1934  04/22/2019  Chase Mendez was observed post Covid-19 immunization for 15 minutes without incident. He was provided with Vaccine Information Sheet and instruction to access the V-Safe system.   Chase Mendez was instructed to call 911 with any severe reactions post vaccine: [name] Difficulty breathing  . Swelling of face and throat  . A fast heartbeat  . A bad rash all over body  . Dizziness and weakness   Immunizations Administered    Name Date Dose VIS Date Route   Pfizer COVID-19 Vaccine 04/22/2019  9:08 AM 0.3 mL 01/17/2019 Intramuscular   Manufacturer: [place]   Lot: [id number]   [place]: [id number]

## 2019-07-22 NOTE — Patient Instructions (Addendum)

## 2019-07-22 NOTE — Progress Notes (Signed)
PROVIDER NOTE: Information contained herein reflects review and annotations entered in association with encounter. Interpretation of such information and data should be left to medically-trained personnel. Information provided to patient can be located elsewhere in the medical record under "Patient Instructions". Document created using STT-dictation technology, any transcriptional errors that may result from process are unintentional.    Patient: Chase Mendez  Service Category: E/M  Provider: Gaspar Cola, MD  DOB: 1934/09/04  DOS: 07/23/2019  Specialty: Interventional Pain Management  MRN: [id number]  Setting: Ambulatory outpatient  PCP: Jodi Marble, MD  Type: Established Patient    Referring Provider: Jodi Marble, MD  Location: Office  Delivery: Face-to-face     HPI  Reason for encounter: [name], a 85 y.o. year old male, is here today for evaluation and management of his Chronic pain syndrome [G89.4]. Mr. Karczewski primary complain today is Leg Pain (left) Last encounter: Practice (01/28/2019). My last encounter with him was on Visit date not found. Pertinent problems: [name] has Spinal accessory neuropathy; Lower limb pain, inferior (L5) (Foot Drop) (Left); Chronic low back pain (Secondary Area of Pain) (Bilateral) (ML) (L>R) with sciatica (Left); Chronic lower extremity pain (Primary Area of Pain) (Left); Failed back surgical syndrome; Foot drop (Left); Lumbosacral radiculopathy at L5 (Left); DDD (degenerative disc disease), lumbar; Chronic pain syndrome; Thoracic central spinal stenosis; Chronic musculoskeletal pain; Neurogenic pain; Presence of neurostimulator; Spinal cord stimulator dysfunction (HCC); and Gout of left foot due to renal impairment on their pertinent problem list. Pain Assessment: Severity of Chronic pain is reported as a 5 /10. Location: Leg Left/denies. Onset: More than a month ago. Quality: Sharp, Stabbing. Timing: Constant. Modifying factor(s):  medications, SCS, lying down. Vitals:  height is 6' (1.829 m) and weight is 196 lb (88.9 kg). His temporal temperature is 97.9 F (36.6 C). His blood pressure is 123/64 and his pulse is 68. His respiration is 18 and oxygen saturation is 94%.    The patient indicates doing well with the current medication regimen. No adverse reactions or side effects reported to the medications.  The patient also refers that the spinal cord stimulator is working well for him.  Pharmacotherapy Assessment   Analgesic: Oxycodone IR 15 mg, 1 tab PO q 6 hrs. (60 mg/day of oxycodone) MME/day:68m/day.   Monitoring: [place] PMP: PDMP reviewed during this encounter.       Pharmacotherapy: No side-effects or adverse reactions reported. Compliance: No problems identified. Effectiveness: Clinically acceptable.  WLandis Martins RN  07/23/2019 10:31 AM  Sign when Signing Visit Nursing Pain Medication Assessment:  Safety precautions to be maintained throughout the outpatient stay will include: orient to surroundings, keep bed in low position, maintain call bell within reach at all times, provide assistance with transfer out of bed and ambulation.  Medication Inspection Compliance: Pill count conducted under aseptic conditions, in front of the patient. Neither the pills nor the bottle was removed from the patient's sight at any time. Once count was completed pills were immediately returned to the patient in their original bottle.  Medication: Oxycodone IR Pill/Patch Count: 22 of 120 pills remain Pill/Patch Appearance: Markings consistent with prescribed medication Bottle Appearance: Standard pharmacy container. Clearly labeled. Filled Date: 06/27/2019 Last Medication intake:  Today    UDS:  Summary  Date Value Ref Range Status  11/14/2017 FINAL  Final    Comment:    ==================================================================== TOXASSURE SELECT 13  (MW) ==================================================================== Test  Result       Flag       Units Drug Present and Declared for Prescription Verification   Oxycodone                      4937         EXPECTED   ng/mg creat   Oxymorphone                    1650         EXPECTED   ng/mg creat   Noroxycodone                   6664         EXPECTED   ng/mg creat   Noroxymorphone                 520          EXPECTED   ng/mg creat    Sources of oxycodone are scheduled prescription medications.    Oxymorphone, noroxycodone, and noroxymorphone are expected    metabolites of oxycodone. Oxymorphone is also available as a    scheduled prescription medication. ==================================================================== Test                      Result    Flag   Units      Ref Range   Creatinine              139              mg/dL      >=20 ==================================================================== Declared Medications:  The flagging and interpretation on this report are based on the  following declared medications.  Unexpected results may arise from  inaccuracies in the declared medications.  **Note: The testing scope of this panel includes these medications:  Oxycodone  **Note: The testing scope of this panel does not include following  reported medications:  Allopurinol  Aspirin (Aspirin 81)  Atorvastatin  Citalopram  Cyanocobalamin  Diphenhydramine  Fluticasone  Gabapentin  Isosorbide  Levothyroxine  Linaclotide  Losartan (Losartan Potassium)  Mirabegron  Naloxone  Omeprazole  Ondansetron  Testosterone  Vitamin E ==================================================================== For clinical consultation, please call [phone]. ====================================================================      ROS  Constitutional: Denies any fever or chills Gastrointestinal: No reported hemesis, hematochezia, vomiting,  or acute GI distress Musculoskeletal: Denies any acute onset joint swelling, redness, loss of ROM, or weakness Neurological: No reported episodes of acute onset apraxia, aphasia, dysarthria, agnosia, amnesia, paralysis, loss of coordination, or loss of consciousness  Medication Review  Testosterone, allopurinol, amLODipine, aspirin, atorvastatin, citalopram, diphenhydrAMINE, fluticasone, gabapentin, isosorbide mononitrate, levothyroxine, linaclotide, losartan, mirabegron ER, naloxone, ondansetron, oxyCODONE, pantoprazole, vitamin B-12, and vitamin E  History Review  Allergy: Mr. Thomann is allergic to ibuprofen and sulfa antibiotics. Drug: Mr. Udell  reports current drug use. Drug: Oxycodone. Alcohol:  reports no history of alcohol use. Tobacco:  reports that he has been smoking cigars. He has a 35.00 pack-year smoking history. He has never used smokeless tobacco. Social: Mr. Robles  reports that he has been smoking cigars. He has a 35.00 pack-year smoking history. He has never used smokeless tobacco. He reports current drug use. Drug: Oxycodone. He reports that he does not drink alcohol. Medical:  has a past medical history of Acute encephalopathy (12/08/2014), Anxiety, ARF (acute renal failure) ([place]) (12/08/2014), Back pain, Benign neoplasm of large bowel, Capsulitis, Chronic back pain, Coronavirus infection, Depression, Diabetes mellitus without  complication ([place]), Dysphagia, Exostosis, Foot drop, left, Frequent falls (02/2018), GERD (gastroesophageal reflux disease), Gout, Hypertension, Hypothyroidism, Insomnia, Low testosterone, Microscopic hematuria (2016), Myocardial infarction ([place]), Pneumonia (12/07/2014), Pressure ulcer (12/09/2014), Sepsis ([place]) (12/08/2014), and Thyroid disease. Surgical: Mr. Cichy  has a past surgical history that includes Cholecystectomy; Esophagogastroduodenoscopy (egd) with propofol (N/A, 04/24/2017); Back surgery (1995, 1996); Eye surgery (Bilateral, 1983, 1985);  Appendectomy; and Spinal cord stimulator insertion (N/A, 03/13/2018). Family: family history includes Diabetes in his father; Heart disease in his mother; Kidney cancer in his brother.  Laboratory Chemistry Profile   Renal Lab Results  Component Value Date   BUN 17 01/28/2019   CREATININE 0.90 01/28/2019   GFRAA >60 01/28/2019   GFRNONAA >60 01/28/2019     Hepatic Lab Results  Component Value Date   AST 75 (H) 11/28/2018   ALT 20 11/28/2018   ALBUMIN 3.9 11/28/2018   ALKPHOS 134 (H) 11/28/2018   HCVAB <0.1 07/24/2018   LIPASE 37 08/21/2016     Electrolytes Lab Results  Component Value Date   NA 140 01/28/2019   K 4.0 01/28/2019   CL 105 01/28/2019   CALCIUM 8.9 01/28/2019     Bone No results found for: VD25OH, [id number], QM0867YP9, JK9326ZT2, 25OHVITD1, 25OHVITD2, 25OHVITD3, TESTOFREE, TESTOSTERONE   Inflammation (CRP: Acute Phase) (ESR: Chronic Phase) Lab Results  Component Value Date   LATICACIDVEN 1.1 12/08/2014       Note: Above Lab results reviewed.  Recent Imaging Review  CT HEAD WO CONTRAST CLINICAL DATA:  Ataxia.  Fall.  EXAM: CT HEAD WITHOUT CONTRAST  TECHNIQUE: Contiguous axial images were obtained from the base of the skull through the vertex without intravenous contrast.  COMPARISON:  01/27/2019  FINDINGS: Brain: Mild age related volume loss. No acute intracranial abnormality. Specifically, no hemorrhage, hydrocephalus, mass lesion, acute infarction, or significant intracranial injury.  Vascular: No hyperdense vessel or unexpected calcification.  Skull: No acute calvarial abnormality.  Sinuses/Orbits: Visualized paranasal sinuses and mastoids clear. Orbital soft tissues unremarkable.  Other: None  IMPRESSION: No acute intracranial abnormality.  Electronically Signed   By: Rolm Baptise M.D.   On: 01/28/2019 11:22 Note: Reviewed        Physical Exam  General appearance: Well nourished, well developed, and well hydrated. In no  apparent acute distress Mental status: Alert, oriented x 3 (person, place, & time)       Respiratory: No evidence of acute respiratory distress Eyes: PERLA Vitals: BP 123/64   Pulse 68   Temp 97.9 F (36.6 C) (Temporal)   Resp 18   Ht 6' (1.829 m)   Wt 196 lb (88.9 kg)   SpO2 94%   BMI 26.58 kg/m  BMI: Estimated body mass index is 26.58 kg/m as calculated from the following:   Height as of this encounter: 6' (1.829 m).   Weight as of this encounter: 196 lb (88.9 kg). Ideal: Ideal body weight: 77.6 kg (171 lb 1.2 oz) Adjusted ideal body weight: 82.1 kg (181 lb 0.7 oz)  Assessment   Status Diagnosis  Controlled Controlled Controlled 1. Chronic pain syndrome   2. Chronic lower extremity pain (Primary Area of Pain) (Left)   3. Chronic low back pain (Secondary Area of Pain) (Bilateral) (ML) (L>R) with sciatica (Left)   4. Pharmacologic therapy   5. Neurogenic pain      Updated Problems: No problems updated.  Plan of Care  Problem-specific:  No problem-specific Assessment & Plan notes found for this encounter.  Mr. CHAUNCEY SCIULLI has a  current medication list which includes the following long-term medication(s): allopurinol, amlodipine, citalopram, fluticasone, isosorbide mononitrate, levothyroxine, linaclotide, losartan, mirabegron er, testosterone, gabapentin, [START ON 07/27/2019] oxycodone, [START ON 08/26/2019] oxycodone, [START ON 09/25/2019] oxycodone, pantoprazole, and [DISCONTINUED] diphenhydramine.  Pharmacotherapy (Medications Ordered): Meds ordered this encounter  Medications  . gabapentin (NEURONTIN) 600 MG tablet    Sig: Take 1 tablet (600 mg total) by mouth 2 (two) times daily.    Dispense:  60 tablet    Refill:  5    Fill one day early if pharmacy is closed on scheduled refill date. May substitute for generic if available.  [name] oxyCODONE (ROXICODONE) 15 MG immediate release tablet    Sig: Take 1 tablet (15 mg total) by mouth every 6 (six) hours as needed for  pain. Must last 30 days    Dispense:  120 tablet    Refill:  0    Chronic Pain: STOP Act (Not applicable) Fill 1 day early if closed on refill date. Do not fill until: 07/27/2019. To last until: 08/26/2019. Avoid benzodiazepines within 8 hours of opioids  . oxyCODONE (ROXICODONE) 15 MG immediate release tablet    Sig: Take 1 tablet (15 mg total) by mouth every 6 (six) hours as needed for pain. Must last 30 days    Dispense:  120 tablet    Refill:  0    Chronic Pain: STOP Act (Not applicable) Fill 1 day early if closed on refill date. Do not fill until: 08/26/2019. To last until: 09/25/2019. Avoid benzodiazepines within 8 hours of opioids  . oxyCODONE (ROXICODONE) 15 MG immediate release tablet    Sig: Take 1 tablet (15 mg total) by mouth every 6 (six) hours as needed for pain. Must last 30 days    Dispense:  120 tablet    Refill:  0    Chronic Pain: STOP Act (Not applicable) Fill 1 day early if closed on refill date. Do not fill until: 09/25/2019. To last until: 10/25/2019. Avoid benzodiazepines within 8 hours of opioids   Orders:  Orders Placed This Encounter  Procedures  . ToxASSURE Select 13 (MW), Urine    Volume: 30 ml(s). Minimum 3 ml of urine is needed. Document temperature of fresh sample. Indications: Long term (current) use of opiate analgesic (W29.562)    Order Specific Question:   Release to patient    Answer:   Immediate   Follow-up plan:   Return in about 13 weeks (around 10/22/2019) for (F2F), (MM).      Interventional treatment options:  Under consideration:   Diagnostic Caudal ESI + epidurogram #1 Possible Racz procedure Diagnostic bilateral lumbar facet block Possible bilateral lumbar facet RFA Possible candidate for intrathecal pump trial and implant   Therapeutic/palliative (PRN):   Therapeutic/palliative left L4TFESI#2 Therapeutic/palliative left L5TFESI#2 Bilateral spinal cord stimulator trial (done - 01/17/2018)  Permanent bilateral spinal cord  stimulator implant by Dr. Cari Caraway (neurosurgery) (done - 03/13/2018)     Recent Visits No visits were found meeting these conditions. Showing recent visits within past 90 days and meeting all other requirements Today's Visits Date Type Provider Dept  07/23/19 Office Visit Milinda Pointer, MD [facility]  Showing today's visits and meeting all other requirements Future Appointments Date Type Provider Dept  10/08/19 Appointment Milinda Pointer, MD [facility]  Showing future appointments within next 90 days and meeting all other requirements  I discussed the assessment and treatment plan with the patient. The patient was provided an opportunity to ask questions and all were  answered. The patient agreed with the plan and demonstrated an understanding of the instructions.  Patient advised to call back or seek an in-person evaluation if the symptoms or condition worsens.  Duration of encounter: 30 minutes.  Note by: Gaspar Cola, MD Date: 07/23/2019; Time: 11:56 AM

## 2019-07-23 ENCOUNTER — Encounter: Payer: Self-pay | Admitting: Pain Medicine

## 2019-07-23 ENCOUNTER — Ambulatory Visit: Payer: Worker's Compensation | Attending: Pain Medicine | Admitting: Pain Medicine

## 2019-07-23 ENCOUNTER — Other Ambulatory Visit: Payer: Self-pay

## 2019-07-23 VITALS — BP 123/64 | HR 68 | Temp 97.9°F | Resp 18 | Ht 72.0 in | Wt 196.0 lb

## 2019-07-23 DIAGNOSIS — G8929 Other chronic pain: Secondary | ICD-10-CM | POA: Insufficient documentation

## 2019-07-23 DIAGNOSIS — G894 Chronic pain syndrome: Secondary | ICD-10-CM | POA: Insufficient documentation

## 2019-07-23 DIAGNOSIS — Z79899 Other long term (current) drug therapy: Secondary | ICD-10-CM | POA: Diagnosis not present

## 2019-07-23 DIAGNOSIS — M79605 Pain in left leg: Secondary | ICD-10-CM | POA: Diagnosis not present

## 2019-07-23 DIAGNOSIS — M5442 Lumbago with sciatica, left side: Secondary | ICD-10-CM | POA: Diagnosis not present

## 2019-07-23 DIAGNOSIS — M792 Neuralgia and neuritis, unspecified: Secondary | ICD-10-CM | POA: Insufficient documentation

## 2019-07-23 MED ORDER — OXYCODONE HCL 15 MG PO TABS: 15.0000 mg | ORAL_TABLET | Freq: Four times a day (QID) | ORAL | 0 refills | Status: DC | PRN

## 2019-07-23 MED ORDER — GABAPENTIN 600 MG PO TABS: 600.0000 mg | ORAL_TABLET | Freq: Two times a day (BID) | ORAL | 5 refills | Status: DC

## 2019-07-23 NOTE — Progress Notes (Signed)
Nursing Pain Medication Assessment:  Safety precautions to be maintained throughout the outpatient stay will include: orient to surroundings, keep bed in low position, maintain call bell within reach at all times, provide assistance with transfer out of bed and ambulation.  Medication Inspection Compliance: Pill count conducted under aseptic conditions, in front of the patient. Neither the pills nor the bottle was removed from the patient's sight at any time. Once count was completed pills were immediately returned to the patient in their original bottle.  Medication: Oxycodone IR Pill/Patch Count: 22 of 120 pills remain Pill/Patch Appearance: Markings consistent with prescribed medication Bottle Appearance: Standard pharmacy container. Clearly labeled. Filled Date: 06/27/2019 Last Medication intake:  Today

## 2019-07-29 LAB — TOXASSURE SELECT 13 (MW), URINE

## 2019-10-08 ENCOUNTER — Encounter: Payer: Medicare HMO | Admitting: Pain Medicine

## 2019-10-12 NOTE — Progress Notes (Signed)
PROVIDER NOTE: Information contained herein reflects review and annotations entered in association with encounter. Interpretation of such information and data should be left to medically-trained personnel. Information provided to patient can be located elsewhere in the medical record under "Patient Instructions". Document created using STT-dictation technology, any transcriptional errors that may result from process are unintentional.    Patient: Chase Mendez  Service Category: E/M  Provider: Gaspar Cola, MD  DOB: 1935/01/03  DOS: 10/14/2019  Specialty: Interventional Pain Management  MRN: [id number]  Setting: Ambulatory outpatient  PCP: Jodi Marble, MD  Type: Established Patient    Referring Provider: Jodi Marble, MD  Location: Office  Delivery: Face-to-face     HPI  Reason for encounter: Chase Mendez, a 85 y.o. year old male, is here today for evaluation and management of his Chronic pain syndrome [G89.4]. Chase Mendez primary complain today is Back Pain (lower) Last encounter: Practice (07/23/2019). My last encounter with him was on 07/23/2019. Pertinent problems: Chase Mendez has Spinal accessory neuropathy; Lower limb pain, inferior (L5) (Foot Drop) (Left); Chronic low back pain (Secondary Area of Pain) (Bilateral) (ML) (L>R) with sciatica (Left); Chronic lower extremity pain (Primary Area of Pain) (Left); Failed back surgical syndrome; Foot drop (Left); Lumbosacral radiculopathy at L5 (Left); DDD (degenerative disc disease), lumbar; Chronic pain syndrome; Thoracic central spinal stenosis; Chronic musculoskeletal pain; Neurogenic pain; Presence of neurostimulator; Spinal cord stimulator dysfunction (HCC); and Gout of left foot due to renal impairment on their pertinent problem list. Pain Assessment: Severity of Chronic pain is reported as a 6 /10. Location: Back Lower/into left leg down toes. Onset: More than a month ago. Quality: Stabbing, Burning. Timing: Intermittent.  Modifying factor(s): medicine, lying down. Vitals:  height is 6' (1.829 m) and weight is 198 lb (89.8 kg). His temporal temperature is 97.6 F (36.4 C). His blood pressure is 97/83 and his pulse is 63. His respiration is 16 and oxygen saturation is 96%.   The patient comes into the clinics today for medication management.  He indicates having nausea feeling all of the time.  Although we have really not changed his medication, he has been getting older and his renal function has been going down.  In the past he has been hospitalized due to encephalopathy, likely to be secondary to his kidney problems.  Today we have informed and warned the patient about the national oxycodone shortage.  He has a significant amount of tolerance to the narcotics and over time we have talked to him in multiple locations about the use of a "Drug Holiday" for the purpose of decreasing this tolerance and increasing the effectiveness of the medication.  Unfortunately, due to the national shortage, I cannot do that at this time since it is not likely that I will have the oxycodone 5 mg tablets that I need to be doing this taper.  Today we have talked about several options and as always, the patient keeps bringing up the question as to why he cannot go back on methadone.  As always, I keep reminding him that methadone is a young man's drug and that the use of methadone in older patients is risky secondary to the QR interval prolongation and the possibility of torsades de pointes arrhythmia occurring.  The patient's wife seems to understand this clearly and every time that he brings the topic up, she is the one who reminds them now of the reason why we would not be doing it.  She is also interested  in having him come off of the opioids.  In any case, due to the national shortage, we have talked about several options, one of them being switching him to morphine.  Currently he is taking oxycodone IR 15 mg 1 tablet p.o. every 6 hours (60  mg/day of oxycodone) (90 MME).  An alternative would be to switch him to the MS Contin (morphine ER) 30 mg, 1 tablet p.o. 3 times daily (90 mg/day of morphine) (90 MME).  Today I have talked to them about this and reminded them that he should never break or crush the tablet.  They understood and accepted.  The patient has been provided with a prescription for this medication that should last him until 11/13/2019.  We plan to have an encounter before then to evaluate and possibly refill.  In addition, I have written for gabapentin (Neurontin) 600 mg tablet, 1 tablet p.o. twice daily (60/month) (01/19/2020) today we will make arrangements to transfer this none opioid to the patient's primary care physician.  Pharmacotherapy Assessment   Analgesic: Oxycodone IR 15 mg, 1 tab PO q 6 hrs. (60 mg/day of oxycodone) MME/day:6m/day.   Monitoring: [place] PMP: PDMP reviewed during this encounter.       Pharmacotherapy: No side-effects or adverse reactions reported. Compliance: No problems identified. Effectiveness: Clinically acceptable.  PJanett Billow RN  10/14/2019  2:05 PM  Sign when Signing Visit Nursing Pain Medication Assessment:  Safety precautions to be maintained throughout the outpatient stay will include: orient to surroundings, keep bed in low position, maintain call bell within reach at all times, provide assistance with transfer out of bed and ambulation.  Medication Inspection Compliance: Chase Mendez not comply with our request to bring his pills to be counted. He was reminded that bringing the medication bottles, even when empty, is a requirement.  Medication: None brought in. Pill/Patch Count: None available to be counted. Bottle Appearance: No container available. Did not bring bottle(s) to appointment. Filled Date: N/A Last Medication intake:  Today    UDS:  Summary  Date Value Ref Range Status  07/23/2019 Note  Final    Comment:     ==================================================================== [employer] (MW) ==================================================================== Test                             Result       Flag       Units  Drug Present and Declared for Prescription Verification   Oxycodone                      >3861        EXPECTED   ng/mg creat   Oxymorphone                    2135         EXPECTED   ng/mg creat   Noroxycodone                   >3861        EXPECTED   ng/mg creat   Noroxymorphone                 1084         EXPECTED   ng/mg creat    Sources of oxycodone are scheduled prescription medications.    Oxymorphone, noroxycodone, and noroxymorphone are expected    metabolites of oxycodone. Oxymorphone is also available as a  scheduled prescription medication.  ==================================================================== Test                      Result    Flag   Units      Ref Range   Creatinine              259              mg/dL      >=20 ==================================================================== Declared Medications:  The flagging and interpretation on this report are based on the  following declared medications.  Unexpected results may arise from  inaccuracies in the declared medications.   **Note: The testing scope of this panel includes these medications:   Oxycodone (Roxicodone)   **Note: The testing scope of this panel does not include the  following reported medications:   Allopurinol (Zyloprim)  Amlodipine (Norvasc)  Aspirin  Atorvastatin (Lipitor)  Citalopram (Celexa)  Cyanocobalamin  Fluticasone (Flonase)  Gabapentin (Neurontin)  Isosorbide (Imdur)  Levothyroxine (Synthroid)  Linaclotide (Linzess)  Losartan (Cozaar)  Mirabegron (Myrbetriq)  Naloxone (Narcan)  Ondansetron (Zofran)  Pantoprazole (Protonix)  Testosterone  Vitamin E ==================================================================== For clinical  consultation, please call [phone]. ====================================================================      ROS  Constitutional: Denies any fever or chills Gastrointestinal: No reported hemesis, hematochezia, vomiting, or acute GI distress Musculoskeletal: Denies any acute onset joint swelling, redness, loss of ROM, or weakness Neurological: No reported episodes of acute onset apraxia, aphasia, dysarthria, agnosia, amnesia, paralysis, loss of coordination, or loss of consciousness  Medication Review  Testosterone, allopurinol, amLODipine, aspirin, atorvastatin, citalopram, diphenhydrAMINE, fluticasone, gabapentin, isosorbide mononitrate, levothyroxine, linaclotide, losartan, morphine, naloxone, ondansetron, pantoprazole, tamsulosin, vitamin B-12, and vitamin E  History Review  Allergy: Chase Mendez is allergic to ibuprofen and sulfa antibiotics. Drug: Chase Mendez  reports current drug use. Drug: Oxycodone. Alcohol:  reports no history of alcohol use. Tobacco:  reports that he has been smoking cigars. He has a 35.00 pack-year smoking history. He has never used smokeless tobacco. Social: Chase Mendez  reports that he has been smoking cigars. He has a 35.00 pack-year smoking history. He has never used smokeless tobacco. He reports current drug use. Drug: Oxycodone. He reports that he does not drink alcohol. Medical:  has a past medical history of Acute encephalopathy (12/08/2014), Anxiety, ARF (acute renal failure) ([place]) (12/08/2014), Back pain, Benign neoplasm of large bowel, Capsulitis, Chronic back pain, Coronavirus infection, Depression, Diabetes mellitus without complication ([place]), Dysphagia, Exostosis, Foot drop, left, Frequent falls (02/2018), GERD (gastroesophageal reflux disease), Gout, Hypertension, Hypothyroidism, Insomnia, Low testosterone, Microscopic hematuria (2016), Myocardial infarction ([place]), Pneumonia (12/07/2014), Pressure ulcer (12/09/2014), Sepsis ([place]) (12/08/2014), and  Thyroid disease. Surgical: Chase Mendez  has a past surgical history that includes Cholecystectomy; Esophagogastroduodenoscopy (egd) with propofol (N/A, 04/24/2017); Back surgery (1995, 1996); Eye surgery (Bilateral, 1983, 1985); Appendectomy; and Spinal cord stimulator insertion (N/A, 03/13/2018). Family: family history includes Diabetes in his father; Heart disease in his mother; Kidney cancer in his brother.  Laboratory Chemistry Profile   Renal Lab Results  Component Value Date   BUN 17 01/28/2019   CREATININE 0.90 01/28/2019   GFRAA >60 01/28/2019   GFRNONAA >60 01/28/2019     Hepatic Lab Results  Component Value Date   AST 75 (H) 11/28/2018   ALT 20 11/28/2018   ALBUMIN 3.9 11/28/2018   ALKPHOS 134 (H) 11/28/2018   HCVAB <0.1 07/24/2018   LIPASE 37 08/21/2016     Electrolytes Lab Results  Component Value Date   NA 140 01/28/2019  K 4.0 01/28/2019   CL 105 01/28/2019   CALCIUM 8.9 01/28/2019     Bone No results found for: VD25OH, [id number], JT7017BL3, JQ3009QZ3, 25OHVITD1, 25OHVITD2, 25OHVITD3, TESTOFREE, TESTOSTERONE   Inflammation (CRP: Acute Phase) (ESR: Chronic Phase) Lab Results  Component Value Date   LATICACIDVEN 1.1 12/08/2014       Note: Above Lab results reviewed.  Recent Imaging Review  CT HEAD WO CONTRAST CLINICAL DATA:  Ataxia.  Fall.  EXAM: CT HEAD WITHOUT CONTRAST  TECHNIQUE: Contiguous axial images were obtained from the base of the skull through the vertex without intravenous contrast.  COMPARISON:  01/27/2019  FINDINGS: Brain: Mild age related volume loss. No acute intracranial abnormality. Specifically, no hemorrhage, hydrocephalus, mass lesion, acute infarction, or significant intracranial injury.  Vascular: No hyperdense vessel or unexpected calcification.  Skull: No acute calvarial abnormality.  Sinuses/Orbits: Visualized paranasal sinuses and mastoids clear. Orbital soft tissues unremarkable.  Other:  None  IMPRESSION: No acute intracranial abnormality.  Electronically Signed   By: Rolm Baptise M.D.   On: 01/28/2019 11:22 Note: Reviewed        Physical Exam  General appearance: Well nourished, well developed, and well hydrated. In no apparent acute distress Mental status: Alert, oriented x 3 (person, place, & time)       Respiratory: No evidence of acute respiratory distress Eyes: PERLA Vitals: BP 97/83 (BP Location: Left Arm, Patient Position: Sitting, Cuff Size: Normal)   Pulse 63   Temp 97.6 F (36.4 C) (Temporal)   Resp 16   Ht 6' (1.829 m)   Wt 198 lb (89.8 kg)   SpO2 96%   BMI 26.85 kg/m  BMI: Estimated body mass index is 26.85 kg/m as calculated from the following:   Height as of this encounter: 6' (1.829 m).   Weight as of this encounter: 198 lb (89.8 kg). Ideal: Ideal body weight: 77.6 kg (171 lb 1.2 oz) Adjusted ideal body weight: 82.5 kg (181 lb 13.5 oz)  Assessment   Status Diagnosis  Controlled Controlled Controlled 1. Chronic pain syndrome   2. DDD (degenerative disc disease), lumbar   3. Failed back surgical syndrome   4. Lower limb pain, inferior (L5) (Foot Drop) (Left)   5. Pharmacologic therapy   6. Opioid Drug tolerance   7. Uncomplicated opioid dependence ([place])      Updated Problems: Problem  Uncomplicated Opioid Dependence (Hcc)    Plan of Care  Problem-specific:  No problem-specific Assessment & Plan notes found for this encounter.  Chase Mendez has a current medication list which includes the following long-term medication(s): allopurinol, amlodipine, citalopram, fluticasone, gabapentin, isosorbide mononitrate, levothyroxine, linaclotide, losartan, testosterone, morphine, pantoprazole, and [DISCONTINUED] diphenhydramine.  Pharmacotherapy (Medications Ordered): Meds ordered this encounter  Medications  . morphine (MS CONTIN) 30 MG 12 hr tablet    Sig: Take 1 tablet (30 mg total) by mouth in the morning, at noon, and at  bedtime. Must last 30 days. Do not break tablet    Dispense:  90 tablet    Refill:  0    Chronic Pain: STOP Act (Not applicable) Fill 1 day early if closed on refill date. Do not fill until: 10/14/2019. To last until: 11/13/2019. Avoid benzodiazepines within 8 hours of opioids   Orders:  No orders of the defined types were placed in this encounter.  Follow-up plan:   Return in about 1 month (around 11/13/2019) for (20-min), (F2F), (Med Mgmt).      Interventional treatment options:  Under consideration:  Diagnostic Caudal ESI + epidurogram #1 Possible Racz procedure Diagnostic bilateral lumbar facet block Possible bilateral lumbar facet RFA Possible candidate for intrathecal pump trial and implant   Therapeutic/palliative (PRN):   Therapeutic/palliative left L4TFESI#2 Therapeutic/palliative left L5TFESI#2 Bilateral spinal cord stimulator trial (done - 01/17/2018)  Permanent bilateral spinal cord stimulator implant by Dr. Cari Caraway (neurosurgery) (done - 03/13/2018)      Recent Visits Date Type Provider Dept  10/14/19 Office Visit Milinda Pointer, [facility]  07/23/19 Office Visit Milinda Pointer, MD [facility]  Showing recent visits within past 90 days and meeting all other requirements Future Appointments Date Type Provider Dept  11/13/19 Appointment Milinda Pointer, MD [facility]  Showing future appointments within next 90 days and meeting all other requirements  I discussed the assessment and treatment plan with the patient. The patient was provided an opportunity to ask questions and all were answered. The patient agreed with the plan and demonstrated an understanding of the instructions.  Patient advised to call back or seek an in-person evaluation if the symptoms or condition worsens.  Duration of encounter: 30 minutes.  Note by: Gaspar Cola, MD Date: 10/14/2019; Time: 3:23 PM

## 2019-10-14 ENCOUNTER — Other Ambulatory Visit: Payer: Self-pay

## 2019-10-14 ENCOUNTER — Encounter: Payer: Self-pay | Admitting: Pain Medicine

## 2019-10-14 ENCOUNTER — Ambulatory Visit: Payer: Worker's Compensation | Attending: Pain Medicine | Admitting: Pain Medicine

## 2019-10-14 VITALS — BP 97/83 | HR 63 | Temp 97.6°F | Resp 16 | Ht 72.0 in | Wt 198.0 lb

## 2019-10-14 DIAGNOSIS — G894 Chronic pain syndrome: Secondary | ICD-10-CM | POA: Insufficient documentation

## 2019-10-14 DIAGNOSIS — M79605 Pain in left leg: Secondary | ICD-10-CM | POA: Diagnosis not present

## 2019-10-14 DIAGNOSIS — T50905S Adverse effect of unspecified drugs, medicaments and biological substances, sequela: Secondary | ICD-10-CM | POA: Diagnosis present

## 2019-10-14 DIAGNOSIS — M961 Postlaminectomy syndrome, not elsewhere classified: Secondary | ICD-10-CM | POA: Insufficient documentation

## 2019-10-14 DIAGNOSIS — M5136 Other intervertebral disc degeneration, lumbar region: Secondary | ICD-10-CM | POA: Diagnosis not present

## 2019-10-14 DIAGNOSIS — F1120 Opioid dependence, uncomplicated: Secondary | ICD-10-CM | POA: Diagnosis present

## 2019-10-14 DIAGNOSIS — Z79899 Other long term (current) drug therapy: Secondary | ICD-10-CM | POA: Insufficient documentation

## 2019-10-14 MED ORDER — MORPHINE SULFATE ER 30 MG PO TBCR: 30.0000 mg | EXTENDED_RELEASE_TABLET | Freq: Three times a day (TID) | ORAL | 0 refills | Status: DC

## 2019-10-14 NOTE — Progress Notes (Signed)
Nursing Pain Medication Assessment:  Safety precautions to be maintained throughout the outpatient stay will include: orient to surroundings, keep bed in low position, maintain call bell within reach at all times, provide assistance with transfer out of bed and ambulation.  Medication Inspection Compliance: Chase Mendez did not comply with our request to bring his pills to be counted. He was reminded that bringing the medication bottles, even when empty, is a requirement.  Medication: None brought in. Pill/Patch Count: None available to be counted. Bottle Appearance: No container available. Did not bring bottle(s) to appointment. Filled Date: N/A Last Medication intake:  Today

## 2019-10-14 NOTE — Patient Instructions (Signed)
____________________________________________________________________________________________  Medication Rules  Purpose: To inform patients, and their family members, of our rules and regulations.  Applies to: All patients receiving prescriptions (written or electronic).  Pharmacy of record: Pharmacy where electronic prescriptions will be sent. If written prescriptions are taken to a different pharmacy, please inform the nursing staff. The pharmacy listed in the electronic medical record should be the one where you would like electronic prescriptions to be sent.  Electronic prescriptions: In compliance with the  Strengthen Opioid Misuse Prevention (STOP) Act of 2017 (Session Law 2017-74/H243), effective February 06, 2018, all controlled substances must be electronically prescribed. Calling prescriptions to the pharmacy will cease to exist.  Prescription refills: Only during scheduled appointments. Applies to all prescriptions.  NOTE: The following applies primarily to controlled substances (Opioid* Pain Medications).   Type of encounter (visit): For patients receiving controlled substances, face-to-face visits are required. (Not an option or up to the patient.)  Patient's responsibilities: 1. Pain Pills: Bring all pain pills to every appointment (except for procedure appointments). 2. Pill Bottles: Bring pills in original pharmacy bottle. Always bring the newest bottle. Bring bottle, even if empty. 3. Medication refills: You are responsible for knowing and keeping track of what medications you take and those you need refilled. The day before your appointment: write a list of all prescriptions that need to be refilled. The day of the appointment: give the list to the admitting nurse. Prescriptions will be written only during appointments. No prescriptions will be written on procedure days. If you forget a medication: it will not be "Called in", "Faxed", or "electronically sent".  You will need to get another appointment to get these prescribed. No early refills. Do not call asking to have your prescription filled early. 4. Prescription Accuracy: You are responsible for carefully inspecting your prescriptions before leaving our office. Have the discharge nurse carefully go over each prescription with you, before taking them home. Make sure that your name is accurately spelled, that your address is correct. Check the name and dose of your medication to make sure it is accurate. Check the number of pills, and the written instructions to make sure they are clear and accurate. Make sure that you are given enough medication to last until your next medication refill appointment. 5. Taking Medication: Take medication as prescribed. When it comes to controlled substances, taking less pills or less frequently than prescribed is permitted and encouraged. Never take more pills than instructed. Never take medication more frequently than prescribed.  6. Inform other Doctors: Always inform, all of your healthcare providers, of all the medications you take. 7. Pain Medication from other Providers: You are not allowed to accept any additional pain medication from any other Doctor or Healthcare provider. There are two exceptions to this rule. (see below) In the event that you require additional pain medication, you are responsible for notifying us, as stated below. 8. Medication Agreement: You are responsible for carefully reading and following our Medication Agreement. This must be signed before receiving any prescriptions from our practice. Safely store a copy of your signed Agreement. Violations to the Agreement will result in no further prescriptions. (Additional copies of our Medication Agreement are available upon request.) 9. Laws, Rules, & Regulations: All patients are expected to follow all Federal and State Laws, Statutes, Rules, & Regulations. Ignorance of the Laws does not constitute a  valid excuse.  10. Illegal drugs and Controlled Substances: The use of illegal substances (including, but not limited to marijuana and its   derivatives) and/or the illegal use of any controlled substances is strictly prohibited. Violation of this rule may result in the immediate and permanent discontinuation of any and all prescriptions being written by our practice. The use of any illegal substances is prohibited. 11. Adopted CDC guidelines & recommendations: Target dosing levels will be at or below 60 MME/day. Use of benzodiazepines** is not recommended.  Exceptions: There are only two exceptions to the rule of not receiving pain medications from other Healthcare Providers. 1. Exception #1 (Emergencies): In the event of an emergency (i.e.: accident requiring emergency care), you are allowed to receive additional pain medication. However, you are responsible for: As soon as you are able, call our office [phone], at any time of the day or night, and leave a message stating your name, the date and nature of the emergency, and the name and dose of the medication prescribed. In the event that your call is answered by a member of our staff, make sure to document and save the date, time, and the name of the person that took your information.  2. Exception #2 (Planned Surgery): In the event that you are scheduled by another doctor or dentist to have any type of surgery or procedure, you are allowed (for a period no longer than 30 days), to receive additional pain medication, for the acute post-op pain. However, in this case, you are responsible for picking up a copy of our "Post-op Pain Management for Surgeons" handout, and giving it to your surgeon or dentist. This document is available at our office, and does not require an appointment to obtain it. Simply go to our office during business hours (Monday-Thursday from 8:00 AM to 4:00 PM) (Friday 8:00 AM to 12:00 Noon) or if you have a scheduled appointment  with us, prior to your surgery, and ask for it by name. In addition, you are responsible for: calling our office [phone], at any time of the day or night, and leaving a message stating your name, name of your surgeon, type of surgery, and date of procedure or surgery. Failure to comply with your responsibilities may result in termination of therapy involving the controlled substances.  *Opioid medications include: morphine, codeine, oxycodone, oxymorphone, hydrocodone, hydromorphone, meperidine, tramadol, tapentadol, buprenorphine, fentanyl, methadone. **Benzodiazepine medications include: diazepam (Valium), alprazolam (Xanax), clonazepam (Klonopine), lorazepam (Ativan), clorazepate (Tranxene), chlordiazepoxide (Librium), estazolam (Prosom), oxazepam (Serax), temazepam (Restoril), triazolam (Halcion) (Last updated: 10/14/2019) ____________________________________________________________________________________________   ____________________________________________________________________________________________  Medication Recommendations and Reminders  Applies to: All patients receiving prescriptions (written and/or electronic).  Medication Rules & Regulations: These rules and regulations exist for your safety and that of others. They are not flexible and neither are we. Dismissing or ignoring them will be considered "non-compliance" with medication therapy, resulting in complete and irreversible termination of such therapy. (See document titled "Medication Rules" for more details.) In all conscience, because of safety reasons, we cannot continue providing a therapy where the patient does not follow instructions.  Pharmacy of record:   Definition: This is the pharmacy where your electronic prescriptions will be sent.   We do not endorse any particular pharmacy, however, we have experienced problems with [facility] not securing enough medication supply for the community.  We do not  restrict you in your choice of pharmacy. However, once we write for your prescriptions, we will NOT be re-sending more prescriptions to fix restricted supply problems created by your pharmacy, or your insurance.   The pharmacy listed in the electronic medical record should be the   one where you want electronic prescriptions to be sent.  If you choose to change pharmacy, simply notify our nursing staff.  Recommendations:  Keep all of your pain medications in a safe place, under lock and key, even if you live alone. We will NOT replace lost, stolen, or damaged medication.  After you fill your prescription, take 1 week's worth of pills and put them away in a safe place. You should keep a separate, properly labeled bottle for this purpose. The remainder should be kept in the original bottle. Use this as your primary supply, until it runs out. Once it's gone, then you know that you have 1 week's worth of medicine, and it is time to come in for a prescription refill. If you do this correctly, it is unlikely that you will ever run out of medicine.  To make sure that the above recommendation works, it is very important that you make sure your medication refill appointments are scheduled at least 1 week before you run out of medicine. To do this in an effective manner, make sure that you do not leave the office without scheduling your next medication management appointment. Always ask the nursing staff to show you in your prescription , when your medication will be running out. Then arrange for the receptionist to get you a return appointment, at least 7 days before you run out of medicine. Do not wait until you have 1 or 2 pills left, to come in. This is very poor planning and does not take into consideration that we may need to cancel appointments due to bad weather, sickness, or emergencies affecting our staff.  DO NOT ACCEPT A "Partial Fill": If for any reason your pharmacy does not have enough pills/tablets  to completely fill or refill your prescription, do not allow for a "partial fill". The law allows the pharmacy to complete that prescription within 72 hours, without requiring a new prescription. If they do not fill the rest of your prescription within those 72 hours, you will need a separate prescription to fill the remaining amount, which we will NOT provide. If the reason for the partial fill is your insurance, you will need to talk to the pharmacist about payment alternatives for the remaining tablets, but again, DO NOT ACCEPT A PARTIAL FILL, unless you can trust your pharmacist to obtain the remainder of the pills within 72 hours.  Prescription refills and/or changes in medication(s):   Prescription refills, and/or changes in dose or medication, will be conducted only during scheduled medication management appointments. (Applies to both, written and electronic prescriptions.)  No refills on procedure days. No medication will be changed or started on procedure days. No changes, adjustments, and/or refills will be conducted on a procedure day. Doing so will interfere with the diagnostic portion of the procedure.  No phone refills. No medications will be "called into the pharmacy".  No Fax refills.  No weekend refills.  No Holliday refills.  No after hours refills.  Remember:  Business hours are:  Monday to Thursday 8:00 AM to 4:00 PM Provider's Schedule: Lyndel Dancel, MD - Appointments are:  Medication management: Monday and Wednesday 8:00 AM to 4:00 PM Procedure day: Tuesday and Thursday 7:30 AM to 4:00 PM Bilal Lateef, MD - Appointments are:  Medication management: Tuesday and Thursday 8:00 AM to 4:00 PM Procedure day: Monday and Wednesday 7:30 AM to 4:00 PM (Last update: 08/27/2019) ____________________________________________________________________________________________    

## 2019-10-19 DIAGNOSIS — F1120 Opioid dependence, uncomplicated: Secondary | ICD-10-CM | POA: Insufficient documentation

## 2019-10-20 ENCOUNTER — Telehealth: Payer: Self-pay | Admitting: *Deleted

## 2019-10-20 NOTE — Telephone Encounter (Signed)
Called patient, spoke with wife. Per Dr. Marjorie Smolder, advised her to reduce dose to 2 pills per day. Call us back if unable to tolerated that dose.

## 2019-10-20 NOTE — Telephone Encounter (Signed)
I have spoken with Dr. Dossie Arbour about this. Waiting for his reply.

## 2019-10-20 NOTE — Telephone Encounter (Signed)
Does the patient need to come in or can you address this virtually?

## 2019-10-21 ENCOUNTER — Other Ambulatory Visit: Payer: Self-pay | Admitting: Pain Medicine

## 2019-10-21 DIAGNOSIS — G894 Chronic pain syndrome: Secondary | ICD-10-CM

## 2019-10-21 DIAGNOSIS — Z79899 Other long term (current) drug therapy: Secondary | ICD-10-CM

## 2019-10-21 DIAGNOSIS — M961 Postlaminectomy syndrome, not elsewhere classified: Secondary | ICD-10-CM

## 2019-10-21 MED ORDER — OXYCODONE HCL 10 MG PO TABS: 10.0000 mg | ORAL_TABLET | Freq: Four times a day (QID) | ORAL | 0 refills | Status: DC | PRN

## 2019-10-21 MED ORDER — OXYCODONE HCL 5 MG PO TABS: ORAL_TABLET | ORAL | 0 refills | Status: DC

## 2019-10-21 NOTE — Telephone Encounter (Signed)
Called and talked with patients wife and informed her that Chase Mendez medication has been sent. She stated that she will come tomorrow and bring the remaining medication back to be counted and wasted.

## 2019-10-21 NOTE — Progress Notes (Signed)
The patient was unable to tolerate that morphine.  Apparently he experienced some side effects to it.  Therefore we will go ahead and put him back on the oxycodone.  We have contacted the pharmacy and they indicate having the oxycodone 10 mg and 5 mg.  We will begin the downward taper as we had talked about during our last visit.  He usually takes oxycodone 15 mg every 6 hours.  We will begin to bring his dose down by 5 mg every 7 days.

## 2019-10-22 NOTE — Telephone Encounter (Signed)
Patient's wife brought in the MS contin for [place] to waste.  79/90 wasted in toilet with H2O flush. I also counted the oxycodone 15mg  she had as well. 36/120 counted.  She understand about the new scripts and the taper. Instructed to call prn any problems.

## 2019-10-23 ENCOUNTER — Telehealth: Payer: Self-pay | Admitting: *Deleted

## 2019-10-23 NOTE — Telephone Encounter (Signed)
Patient's wife called back. Taper explained in detail with her verbalized understanding. Reinforced for her to call for ANY questions or concerns about his medications or the taper.

## 2019-11-13 ENCOUNTER — Ambulatory Visit: Payer: Worker's Compensation | Attending: Pain Medicine | Admitting: Pain Medicine

## 2019-11-13 ENCOUNTER — Other Ambulatory Visit: Payer: Self-pay

## 2019-11-13 ENCOUNTER — Encounter: Payer: Self-pay | Admitting: Pain Medicine

## 2019-11-13 VITALS — BP 97/55 | HR 60 | Temp 98.4°F | Ht 72.0 in | Wt 197.0 lb

## 2019-11-13 DIAGNOSIS — Z79899 Other long term (current) drug therapy: Secondary | ICD-10-CM

## 2019-11-13 DIAGNOSIS — G894 Chronic pain syndrome: Secondary | ICD-10-CM

## 2019-11-13 DIAGNOSIS — M961 Postlaminectomy syndrome, not elsewhere classified: Secondary | ICD-10-CM

## 2019-11-13 DIAGNOSIS — T50905S Adverse effect of unspecified drugs, medicaments and biological substances, sequela: Secondary | ICD-10-CM

## 2019-11-13 MED ORDER — OXYCODONE HCL 10 MG PO TABS: 10.0000 mg | ORAL_TABLET | Freq: Four times a day (QID) | ORAL | 0 refills | Status: DC | PRN

## 2019-11-13 MED ORDER — OXYCODONE HCL 5 MG PO TABS: 5.0000 mg | ORAL_TABLET | Freq: Every day | ORAL | 0 refills | Status: DC

## 2019-11-13 MED ORDER — OXYCODONE HCL 5 MG PO TABS: 5.0000 mg | ORAL_TABLET | Freq: Two times a day (BID) | ORAL | 0 refills | Status: DC

## 2019-11-13 MED ORDER — OXYCODONE HCL 5 MG PO TABS: 5.0000 mg | ORAL_TABLET | Freq: Four times a day (QID) | ORAL | 0 refills | Status: DC

## 2019-11-13 MED ORDER — OXYCODONE HCL 5 MG PO TABS: ORAL_TABLET | ORAL | 0 refills | Status: DC

## 2019-11-13 MED ORDER — OXYCODONE HCL 5 MG PO TABS: 5.0000 mg | ORAL_TABLET | Freq: Three times a day (TID) | ORAL | 0 refills | Status: DC

## 2019-11-13 NOTE — Progress Notes (Signed)
Nursing Pain Medication Assessment:  Safety precautions to be maintained throughout the outpatient stay will include: orient to surroundings, keep bed in low position, maintain call bell within reach at all times, provide assistance with transfer out of bed and ambulation.  Medication Inspection Compliance: Pill count conducted under aseptic conditions, in front of the patient. Neither the pills nor the bottle was removed from the patient's sight at any time. Once count was completed pills were immediately returned to the patient in their original bottle.  Nursing Pain Medication Assessment:  Safety precautions to be maintained throughout the outpatient stay will include: orient to surroundings, keep bed in low position, maintain call bell within reach at all times, provide assistance with transfer out of bed and ambulation.  Medication Inspection Compliance: Pill count conducted under aseptic conditions, in front of the patient. Neither the pills nor the bottle was removed from the patient's sight at any time. Once count was completed pills were immediately returned to the patient in their original bottle.  Medication #1: Oxycodone IR  5mg  Pill/Patch Count: 2 of 42 pills remain Pill/Patch Appearance: Markings consistent with prescribed medication Bottle Appearance: Standard pharmacy container. Clearly labeled. Filled Date: 36 / 66 / 21 Last Medication intake:  Today  Medication #2: Oxycodone IR 10mg  Pill/Patch Count: 37 of 120 pills remain Pill/Patch Appearance: Markings consistent with prescribed medication Bottle Appearance: Standard pharmacy container. Clearly labeled. Filled Date: 23 / 15 / 4 Last Medication intake:  Today

## 2019-11-13 NOTE — Progress Notes (Signed)
PROVIDER NOTE: Information contained herein reflects review and annotations entered in association with encounter. Interpretation of such information and data should be left to medically-trained personnel. Information provided to patient can be located elsewhere in the medical record under "Patient Instructions". Document created using STT-dictation technology, any transcriptional errors that may result from process are unintentional.    Patient: Chase Mendez  Service Category: E/M  Provider: Gaspar Cola, MD  DOB: May 07, 1934  DOS: 11/13/2019  Specialty: Interventional Pain Management  MRN: [id number]  Setting: Ambulatory outpatient  PCP: Chase Marble, MD  Type: Established Patient    Referring Provider: Jodi Marble, MD  Location: Office  Delivery: Face-to-face     HPI  [name], a 85 y.o. year old male, is here today because of his Chronic pain syndrome [G89.4]. Chase Mendez primary complain today is Hip Pain Last encounter: My last encounter with him was on 10/14/2019. Pertinent problems: Chase Mendez has Spinal accessory neuropathy; Lower limb pain, inferior (L5) (Foot Drop) (Left); Chronic low back pain (Secondary Area of Pain) (Bilateral) (ML) (L>R) with sciatica (Left); Chronic lower extremity pain (Primary Area of Pain) (Left); Failed back surgical syndrome; Foot drop (Left); Lumbosacral radiculopathy at L5 (Left); DDD (degenerative disc disease), lumbar; Chronic pain syndrome; Thoracic central spinal stenosis; Chronic musculoskeletal pain; Neurogenic pain; Presence of neurostimulator; Spinal cord stimulator dysfunction (HCC); and Gout of left foot due to renal impairment on their pertinent problem list. Pain Assessment: Severity of Chronic pain is reported as a 6 /10. Location: Hip Left/pain radiaties down left leg to his foot. Onset: More than a month ago. Quality: Burning, Aching, Stabbing, Shooting. Timing: Constant. Modifying factor(s): meds and rest. Vitals:  height  is 6' (1.829 m) and weight is 197 lb (89.4 kg). His temperature is 98.4 F (36.9 C). His blood pressure is 97/55 (abnormal) and his pulse is 60. His oxygen saturation is 97%.   Reason for encounter: medication management.  We have been tapering his medications down due to the patient's inability to tolerate such high dose, as he used to.  Initially we had switched him to the MS Contin, but this proved to be too strong for him and on 10/21/2019 the morphine ER was discontinued and the patient put back on the oxycodone.  Before doing that we had called the pharmacy and they indicated that they did have enough of the 10 mg oxycodone IR and the 5 mg oxycodone IR to cover for his tapering schedule.  Today he has returned and he seems to be doing well.  RTCB: 01/19/20  Pharmacotherapy Assessment   Analgesic: Oxycodone IR 15 mg, 1 tab PO q 6 hrs. (60 mg/day of oxycodone) MME/day:35m/day.   Monitoring: [employer] PMP: PDMP reviewed during this encounter.       Pharmacotherapy: No side-effects or adverse reactions reported. Compliance: No problems identified. Effectiveness: Clinically acceptable.  BChauncey Fischer RN  11/13/2019  2:04 PM  Sign when Signing Visit Nursing Pain Medication Assessment:  Safety precautions to be maintained throughout the outpatient stay will include: orient to surroundings, keep bed in low position, maintain call bell within reach at all times, provide assistance with transfer out of bed and ambulation.  Medication Inspection Compliance: Pill count conducted under aseptic conditions, in front of the patient. Neither the pills nor the bottle was removed from the patient's sight at any time. Once count was completed pills were immediately returned to the patient in their original bottle.  Nursing Pain Medication Assessment:  Safety precautions to be maintained throughout the outpatient stay will include: orient to surroundings, keep bed in low position, maintain call bell within  reach at all times, provide assistance with transfer out of bed and ambulation.  Medication Inspection Compliance: Pill count conducted under aseptic conditions, in front of the patient. Neither the pills nor the bottle was removed from the patient's sight at any time. Once count was completed pills were immediately returned to the patient in their original bottle.  Medication #1: Oxycodone IR  71m Pill/Patch Count: 2 of 42 pills remain Pill/Patch Appearance: Markings consistent with prescribed medication Bottle Appearance: Standard pharmacy container. Clearly labeled. Filled Date: 972/ 129/ 21 Last Medication intake:  Today  Medication #2: Oxycodone IR 168mPill/Patch Count: 37 of 120 pills remain Pill/Patch Appearance: Markings consistent with prescribed medication Bottle Appearance: Standard pharmacy container. Clearly labeled. Filled Date: 9 77 15 / 2120ast Medication intake:  Today    UDS:  Summary  Date Value Ref Range Status  07/23/2019 Note  Final    Comment:    ==================================================================== [facility] (MW) ==================================================================== Test                             Result       Flag       Units  Drug Present and Declared for Prescription Verification   Oxycodone                      >3861        EXPECTED   ng/mg creat   Oxymorphone                    2135         EXPECTED   ng/mg creat   Noroxycodone                   >3861        EXPECTED   ng/mg creat   Noroxymorphone                 1084         EXPECTED   ng/mg creat    Sources of oxycodone are scheduled prescription medications.    Oxymorphone, noroxycodone, and noroxymorphone are expected    metabolites of oxycodone. Oxymorphone is also available as a    scheduled prescription medication.  ==================================================================== Test                      Result    Flag   Units      Ref Range    Creatinine              259              mg/dL      >=20 ==================================================================== Declared Medications:  The flagging and interpretation on this report are based on the  following declared medications.  Unexpected results may arise from  inaccuracies in the declared medications.   **Note: The testing scope of this panel includes these medications:   Oxycodone (Roxicodone)   **Note: The testing scope of this panel does not include the  following reported medications:   Allopurinol (Zyloprim)  Amlodipine (Norvasc)  Aspirin  Atorvastatin (Lipitor)  Citalopram (Celexa)  Cyanocobalamin  Fluticasone (Flonase)  Gabapentin (Neurontin)  Isosorbide (Imdur)  Levothyroxine (Synthroid)  Linaclotide (Linzess)  Losartan (Cozaar)  Mirabegron (Myrbetriq)  Naloxone (Narcan)  Ondansetron (Zofran)  Pantoprazole (Protonix)  Testosterone  Vitamin E ==================================================================== For clinical consultation, please call [phone]. ====================================================================      ROS  Constitutional: Denies any fever or chills Gastrointestinal: No reported hemesis, hematochezia, vomiting, or acute GI distress Musculoskeletal: Denies any acute onset joint swelling, redness, loss of ROM, or weakness Neurological: No reported episodes of acute onset apraxia, aphasia, dysarthria, agnosia, amnesia, paralysis, loss of coordination, or loss of consciousness  Medication Review  Testosterone, allopurinol, amLODipine, aspirin, atorvastatin, citalopram, diphenhydrAMINE, fluticasone, gabapentin, isosorbide mononitrate, levothyroxine, linaclotide, losartan, naloxone, ondansetron, oxyCODONE, pantoprazole, tamsulosin, vitamin B-12, and vitamin E  History Review  Allergy: Chase Mendez is allergic to ibuprofen and sulfa antibiotics. Drug: Chase Mendez  reports current drug use. Drug: Oxycodone. Alcohol:   reports no history of alcohol use. Tobacco:  reports that he has been smoking cigars. He has a 35.00 pack-year smoking history. He has never used smokeless tobacco. Social: Chase Mendez  reports that he has been smoking cigars. He has a 35.00 pack-year smoking history. He has never used smokeless tobacco. He reports current drug use. Drug: Oxycodone. He reports that he does not drink alcohol. Medical:  has a past medical history of Acute encephalopathy (12/08/2014), Anxiety, ARF (acute renal failure) ([place]) (12/08/2014), Back pain, Benign neoplasm of large bowel, Capsulitis, Chronic back pain, Coronavirus infection, Depression, Diabetes mellitus without complication ([place]), Dysphagia, Exostosis, Foot drop, left, Frequent falls (02/2018), GERD (gastroesophageal reflux disease), Gout, Hypertension, Hypothyroidism, Insomnia, Low testosterone, Microscopic hematuria (2016), Myocardial infarction ([place]), Pneumonia (12/07/2014), Pressure ulcer (12/09/2014), Sepsis ([place]) (12/08/2014), and Thyroid disease. Surgical: Chase Mendez  has a past surgical history that includes Cholecystectomy; Esophagogastroduodenoscopy (egd) with propofol (N/A, 04/24/2017); Back surgery (1995, 1996); Eye surgery (Bilateral, 1983, 1985); Appendectomy; and Spinal cord stimulator insertion (N/A, 03/13/2018). Family: family history includes Diabetes in his father; Heart disease in his mother; Kidney cancer in his brother.  Laboratory Chemistry Profile   Renal Lab Results  Component Value Date   BUN 17 01/28/2019   CREATININE 0.90 01/28/2019   GFRAA >60 01/28/2019   GFRNONAA >60 01/28/2019     Hepatic Lab Results  Component Value Date   AST 75 (H) 11/28/2018   ALT 20 11/28/2018   ALBUMIN 3.9 11/28/2018   ALKPHOS 134 (H) 11/28/2018   HCVAB <0.1 07/24/2018   LIPASE 37 08/21/2016     Electrolytes Lab Results  Component Value Date   NA 140 01/28/2019   K 4.0 01/28/2019   CL 105 01/28/2019   CALCIUM 8.9 01/28/2019     Bone No  results found for: VD25OH, [id number], HY8657QI6, NG2952WU1, 25OHVITD1, 25OHVITD2, 25OHVITD3, TESTOFREE, TESTOSTERONE   Inflammation (CRP: Acute Phase) (ESR: Chronic Phase) Lab Results  Component Value Date   LATICACIDVEN 1.1 12/08/2014       Note: Above Lab results reviewed.  Recent Imaging Review  CT HEAD WO CONTRAST CLINICAL DATA:  Ataxia.  Fall.  EXAM: CT HEAD WITHOUT CONTRAST  TECHNIQUE: Contiguous axial images were obtained from the base of the skull through the vertex without intravenous contrast.  COMPARISON:  01/27/2019  FINDINGS: Brain: Mild age related volume loss. No acute intracranial abnormality. Specifically, no hemorrhage, hydrocephalus, mass lesion, acute infarction, or significant intracranial injury.  Vascular: No hyperdense vessel or unexpected calcification.  Skull: No acute calvarial abnormality.  Sinuses/Orbits: Visualized paranasal sinuses and mastoids clear. Orbital soft tissues unremarkable.  Other: None  IMPRESSION: No acute intracranial abnormality.  Electronically Signed   By: Rolm Baptise M.D.   On: 01/28/2019 11:22  Note: Reviewed        Physical Exam  General appearance: Well nourished, well developed, and well hydrated. In no apparent acute distress Mental status: Alert, oriented x 3 (person, place, & time)       Respiratory: No evidence of acute respiratory distress Eyes: PERLA Vitals: BP (!) 97/55   Pulse 60   Temp 98.4 F (36.9 C)   Ht 6' (1.829 m)   Wt 197 lb (89.4 kg)   SpO2 97%   BMI 26.72 kg/m  BMI: Estimated body mass index is 26.72 kg/m as calculated from the following:   Height as of this encounter: 6' (1.829 m).   Weight as of this encounter: 197 lb (89.4 kg). Ideal: Ideal body weight: 77.6 kg (171 lb 1.2 oz) Adjusted ideal body weight: 82.3 kg (181 lb 7.1 oz)  Assessment   Status Diagnosis  Controlled Controlled Controlled 1. Chronic pain syndrome   2. Failed back surgical syndrome   3. Pharmacologic  therapy   4. Opioid Drug tolerance      Updated Problems: No problems updated.  Plan of Care  Problem-specific:  No problem-specific Assessment & Plan notes found for this encounter.  Chase Mendez has a current medication list which includes the following long-term medication(s): allopurinol, amlodipine, citalopram, fluticasone, gabapentin, isosorbide mononitrate, levothyroxine, linaclotide, losartan, [START ON 11/20/2019] oxycodone, [START ON 11/27/2019] oxycodone, [START ON 12/04/2019] oxycodone, [START ON 12/11/2019] oxycodone, [START ON 12/18/2019] oxycodone, [START ON 12/25/2019] oxycodone, [START ON 01/01/2020] oxycodone, pantoprazole, testosterone, and [DISCONTINUED] diphenhydramine.  Pharmacotherapy (Medications Ordered): Meds ordered this encounter  Medications  . oxyCODONE (OXY IR/ROXICODONE) 5 MG immediate release tablet    Sig: Take 2 tablets (10 mg total) by mouth 3 (three) times daily AND 1 tablet (5 mg total) daily with lunch. Do all this for 7 days. For the next 7 days take 2 tab with breakfast, dinner, and at bedtime. Take only 1 tab at noon. Max: 7/day. Must last 7 days..    Dispense:  49 tablet    Refill:  0    Prescription is part of a downward taper. Fill prescriptions in the correct order. Failure to do so may trigger withdrawal syndrome. Fill date: 11/20/2019. To last until: 11/27/2019.  [name] oxyCODONE (OXY IR/ROXICODONE) 5 MG immediate release tablet    Sig: Take 1 tablet (5 mg total) by mouth 6 (six) times daily for 7 days. Max: 6/day. Must last 7 days.    Dispense:  42 tablet    Refill:  0    Prescription is part of a downward taper. Fill prescriptions in the correct order. Failure to do so may trigger withdrawal syndrome. Fill date: 11/27/2019. To last until: 12/04/2019.  [name] oxyCODONE (OXY IR/ROXICODONE) 5 MG immediate release tablet    Sig: Take 1 tablet (5 mg total) by mouth 5 (five) times daily for 7 days. Max: 5/day. Must last 7 days.    Dispense:  35  tablet    Refill:  0    Prescription is part of a downward taper. Fill prescriptions in the correct order. Failure to do so may trigger withdrawal syndrome. Fill date: 12/04/2019. To last until: 12/11/2019.  [name] oxyCODONE (OXY IR/ROXICODONE) 5 MG immediate release tablet    Sig: Take 1 tablet (5 mg total) by mouth 4 (four) times daily for 7 days. Max: 4/day. Must last 7 days.    Dispense:  28 tablet    Refill:  0    Prescription is part of a downward taper.  Fill prescriptions in the correct order. Failure to do so may trigger withdrawal syndrome. Fill date: 12/11/2019. To last until: 12/18/2019.  [name] oxyCODONE (OXY IR/ROXICODONE) 5 MG immediate release tablet    Sig: Take 1 tablet (5 mg total) by mouth 3 (three) times daily for 7 days. Max: 3/day. Must last 7 days.    Dispense:  21 tablet    Refill:  0    Prescription is part of a downward taper. Fill prescriptions in the correct order. Failure to do so may trigger withdrawal syndrome. Fill date: 12/18/2019. To last until: 12/25/2019.  [name] oxyCODONE (OXY IR/ROXICODONE) 5 MG immediate release tablet    Sig: Take 1 tablet (5 mg total) by mouth 2 (two) times daily for 7 days. Max: 2/day. Must last 7 days.    Dispense:  14 tablet    Refill:  0    Prescription is part of a downward taper. Fill prescriptions in the correct order. Failure to do so may trigger withdrawal syndrome. Fill date: 12/25/2019. To last until: 01/01/2020.  [name] oxyCODONE (OXY IR/ROXICODONE) 5 MG immediate release tablet    Sig: Take 1 tablet (5 mg total) by mouth daily for 7 days. Max: 1/day. Must last 7 days.    Dispense:  7 tablet    Refill:  0    Prescription is part of a downward taper. Fill prescriptions in the correct order. Failure to do so may trigger withdrawal syndrome. Fill date: 01/01/2020. To last until: 01/08/2020.   Orders:  No orders of the defined types were placed in this encounter.  Follow-up plan:   Return in about 2 months (around 01/19/2020) for (F2F), (Med  Mgmt) continue downward tapering of opioid analgesics.      Interventional treatment options:  Under consideration:   Diagnostic Caudal ESI + epidurogram #1 Possible Racz procedure Diagnostic bilateral lumbar facet block Possible bilateral lumbar facet RFA Possible candidate for intrathecal pump trial and implant   Therapeutic/palliative (PRN):   Therapeutic/palliative left L4TFESI#2 Therapeutic/palliative left L5TFESI#2 Bilateral spinal cord stimulator trial (done - 01/17/2018)  Permanent bilateral spinal cord stimulator implant by Dr. Cari Caraway (neurosurgery) (done - 03/13/2018)       Recent Visits Date Type Provider Dept  10/14/19 Office Visit Milinda Pointer, MD [facility]  Showing recent visits within past 90 days and meeting all other requirements Today's Visits Date Type Provider Dept  11/13/19 Office Visit Milinda Pointer, MD [facility]  Showing today's visits and meeting all other requirements Future Appointments Date Type Provider Dept  01/12/20 Appointment Milinda Pointer, MD [facility]  Showing future appointments within next 90 days and meeting all other requirements  I discussed the assessment and treatment plan with the patient. The patient was provided an opportunity to ask questions and all were answered. The patient agreed with the plan and demonstrated an understanding of the instructions.  Patient advised to call back or seek an in-person evaluation if the symptoms or condition worsens.  Duration of encounter: 35 minutes.  Note by: Gaspar Cola, MD Date: 11/13/2019; Time: 2:35 PM

## 2019-11-13 NOTE — Patient Instructions (Signed)
____________________________________________________________________________________________  Medication Rules  Purpose: To inform patients, and their family members, of our rules and regulations.  Applies to: All patients receiving prescriptions (written or electronic).  Pharmacy of record: Pharmacy where electronic prescriptions will be sent. If written prescriptions are taken to a different pharmacy, please inform the nursing staff. The pharmacy listed in the electronic medical record should be the one where you would like electronic prescriptions to be sent.  Electronic prescriptions: In compliance with the [place] Strengthen Opioid Misuse Prevention (STOP) Act of 2017 (Session Law 2017-74/H243), effective February 06, 2018, all controlled substances must be electronically prescribed. Calling prescriptions to the pharmacy will cease to exist.  Prescription refills: Only during scheduled appointments. Applies to all prescriptions.  NOTE: The following applies primarily to controlled substances (Opioid* Pain Medications).   Type of encounter (visit): For patients receiving controlled substances, face-to-face visits are required. (Not an option or up to the patient.)  Patient's responsibilities: 1. Pain Pills: Bring all pain pills to every appointment (except for procedure appointments). 2. Pill Bottles: Bring pills in original pharmacy bottle. Always bring the newest bottle. Bring bottle, even if empty. 3. Medication refills: You are responsible for knowing and keeping track of what medications you take and those you need refilled. The day before your appointment: write a list of all prescriptions that need to be refilled. The day of the appointment: give the list to the admitting nurse. Prescriptions will be written only during appointments. No prescriptions will be written on procedure days. If you forget a medication: it will not be "Called in", "Faxed", or "electronically sent".  You will need to get another appointment to get these prescribed. No early refills. Do not call asking to have your prescription filled early. 4. Prescription Accuracy: You are responsible for carefully inspecting your prescriptions before leaving our office. Have the discharge nurse carefully go over each prescription with you, before taking them home. Make sure that your name is accurately spelled, that your address is correct. Check the name and dose of your medication to make sure it is accurate. Check the number of pills, and the written instructions to make sure they are clear and accurate. Make sure that you are given enough medication to last until your next medication refill appointment. 5. Taking Medication: Take medication as prescribed. When it comes to controlled substances, taking less pills or less frequently than prescribed is permitted and encouraged. Never take more pills than instructed. Never take medication more frequently than prescribed.  6. Inform other Doctors: Always inform, all of your healthcare providers, of all the medications you take. 7. Pain Medication from other Providers: You are not allowed to accept any additional pain medication from any other Doctor or Healthcare provider. There are two exceptions to this rule. (see below) In the event that you require additional pain medication, you are responsible for notifying us, as stated below. 8. Medication Agreement: You are responsible for carefully reading and following our Medication Agreement. This must be signed before receiving any prescriptions from our practice. Safely store a copy of your signed Agreement. Violations to the Agreement will result in no further prescriptions. (Additional copies of our Medication Agreement are available upon request.) 9. Laws, Rules, & Regulations: All patients are expected to follow all Federal and State Laws, Statutes, Rules, & Regulations. Ignorance of the Laws does not constitute a  valid excuse.  10. Illegal drugs and Controlled Substances: The use of illegal substances (including, but not limited to marijuana and its   derivatives) and/or the illegal use of any controlled substances is strictly prohibited. Violation of this rule may result in the immediate and permanent discontinuation of any and all prescriptions being written by our practice. The use of any illegal substances is prohibited. 11. Adopted CDC guidelines & recommendations: Target dosing levels will be at or below 60 MME/day. Use of benzodiazepines** is not recommended.  Exceptions: There are only two exceptions to the rule of not receiving pain medications from other Healthcare Providers. 1. Exception #1 (Emergencies): In the event of an emergency (i.e.: accident requiring emergency care), you are allowed to receive additional pain medication. However, you are responsible for: As soon as you are able, call our office [phone], at any time of the day or night, and leave a message stating your name, the date and nature of the emergency, and the name and dose of the medication prescribed. In the event that your call is answered by a member of our staff, make sure to document and save the date, time, and the name of the person that took your information.  2. Exception #2 (Planned Surgery): In the event that you are scheduled by another doctor or dentist to have any type of surgery or procedure, you are allowed (for a period no longer than 30 days), to receive additional pain medication, for the acute post-op pain. However, in this case, you are responsible for picking up a copy of our "Post-op Pain Management for Surgeons" handout, and giving it to your surgeon or dentist. This document is available at our office, and does not require an appointment to obtain it. Simply go to our office during business hours (Monday-Thursday from 8:00 AM to 4:00 PM) (Friday 8:00 AM to 12:00 Noon) or if you have a scheduled appointment  with us, prior to your surgery, and ask for it by name. In addition, you are responsible for: calling our office [phone], at any time of the day or night, and leaving a message stating your name, name of your surgeon, type of surgery, and date of procedure or surgery. Failure to comply with your responsibilities may result in termination of therapy involving the controlled substances.  *Opioid medications include: morphine, codeine, oxycodone, oxymorphone, hydrocodone, hydromorphone, meperidine, tramadol, tapentadol, buprenorphine, fentanyl, methadone. **Benzodiazepine medications include: diazepam (Valium), alprazolam (Xanax), clonazepam (Klonopine), lorazepam (Ativan), clorazepate (Tranxene), chlordiazepoxide (Librium), estazolam (Prosom), oxazepam (Serax), temazepam (Restoril), triazolam (Halcion) (Last updated: 10/14/2019) ____________________________________________________________________________________________   ____________________________________________________________________________________________  Medication Recommendations and Reminders  Applies to: All patients receiving prescriptions (written and/or electronic).  Medication Rules & Regulations: These rules and regulations exist for your safety and that of others. They are not flexible and neither are we. Dismissing or ignoring them will be considered "non-compliance" with medication therapy, resulting in complete and irreversible termination of such therapy. (See document titled "Medication Rules" for more details.) In all conscience, because of safety reasons, we cannot continue providing a therapy where the patient does not follow instructions.  Pharmacy of record:   Definition: This is the pharmacy where your electronic prescriptions will be sent.   We do not endorse any particular pharmacy, however, we have experienced problems with [facility] not securing enough medication supply for the community.  We do not  restrict you in your choice of pharmacy. However, once we write for your prescriptions, we will NOT be re-sending more prescriptions to fix restricted supply problems created by your pharmacy, or your insurance.   The pharmacy listed in the electronic medical record should be the   one where you want electronic prescriptions to be sent.  If you choose to change pharmacy, simply notify our nursing staff.  Recommendations:  Keep all of your pain medications in a safe place, under lock and key, even if you live alone. We will NOT replace lost, stolen, or damaged medication.  After you fill your prescription, take 1 week's worth of pills and put them away in a safe place. You should keep a separate, properly labeled bottle for this purpose. The remainder should be kept in the original bottle. Use this as your primary supply, until it runs out. Once it's gone, then you know that you have 1 week's worth of medicine, and it is time to come in for a prescription refill. If you do this correctly, it is unlikely that you will ever run out of medicine.  To make sure that the above recommendation works, it is very important that you make sure your medication refill appointments are scheduled at least 1 week before you run out of medicine. To do this in an effective manner, make sure that you do not leave the office without scheduling your next medication management appointment. Always ask the nursing staff to show you in your prescription , when your medication will be running out. Then arrange for the receptionist to get you a return appointment, at least 7 days before you run out of medicine. Do not wait until you have 1 or 2 pills left, to come in. This is very poor planning and does not take into consideration that we may need to cancel appointments due to bad weather, sickness, or emergencies affecting our staff.  DO NOT ACCEPT A "Partial Fill": If for any reason your pharmacy does not have enough pills/tablets  to completely fill or refill your prescription, do not allow for a "partial fill". The law allows the pharmacy to complete that prescription within 72 hours, without requiring a new prescription. If they do not fill the rest of your prescription within those 72 hours, you will need a separate prescription to fill the remaining amount, which we will NOT provide. If the reason for the partial fill is your insurance, you will need to talk to the pharmacist about payment alternatives for the remaining tablets, but again, DO NOT ACCEPT A PARTIAL FILL, unless you can trust your pharmacist to obtain the remainder of the pills within 72 hours.  Prescription refills and/or changes in medication(s):   Prescription refills, and/or changes in dose or medication, will be conducted only during scheduled medication management appointments. (Applies to both, written and electronic prescriptions.)  No refills on procedure days. No medication will be changed or started on procedure days. No changes, adjustments, and/or refills will be conducted on a procedure day. Doing so will interfere with the diagnostic portion of the procedure.  No phone refills. No medications will be "called into the pharmacy".  No Fax refills.  No weekend refills.  No Holliday refills.  No after hours refills.  Remember:  Business hours are:  Monday to Thursday 8:00 AM to 4:00 PM Provider's Schedule: Daesia Zylka, MD - Appointments are:  Medication management: Monday and Wednesday 8:00 AM to 4:00 PM Procedure day: Tuesday and Thursday 7:30 AM to 4:00 PM Bilal Lateef, MD - Appointments are:  Medication management: Tuesday and Thursday 8:00 AM to 4:00 PM Procedure day: Monday and Wednesday 7:30 AM to 4:00 PM (Last update: 08/27/2019) ____________________________________________________________________________________________    

## 2019-11-17 ENCOUNTER — Telehealth: Payer: Self-pay | Admitting: Pain Medicine

## 2019-11-17 ENCOUNTER — Other Ambulatory Visit: Payer: Self-pay

## 2019-11-17 ENCOUNTER — Encounter: Payer: Self-pay | Admitting: Pain Medicine

## 2019-11-17 ENCOUNTER — Ambulatory Visit: Payer: Medicare HMO | Admitting: Pain Medicine

## 2019-11-17 ENCOUNTER — Telehealth: Payer: Self-pay | Admitting: *Deleted

## 2019-11-17 NOTE — Telephone Encounter (Signed)
Patient's wife called back and states husband is concerned that he was told along with the daughter that [name] was sending in 5 mg and 10 mg oxycodone.  I told her that could have been the case but he at this point has called and made sure that there only 5 mg oxycodone available and 10 mg and as well as MS contin have all been cancelled.  She also is concerned about withdrawal.  I told her that we do not expect him to have withdrawals since we are titrating him down slowly.  She also asked about pain and I told her that [name] would discuss this at the 01/12/20 appt time.  Told her that if at any time he experiences difficulty or something unexpected, such as withdrawals, to please call the office.  Patient's wife verbalizes u/o information.

## 2019-11-17 NOTE — Telephone Encounter (Signed)
Called to speak with patient's wife and she is confused bout the taper and that it seems to be going up instead of down.  I reviewed the chart and having a difficult time interpreting the taper as well.  Added on for VV this afternoon so that [name] can go over taper for them.

## 2019-11-17 NOTE — Telephone Encounter (Signed)
Wife called and is confused about medication changes for husband. Please call to discuss the change in dosage.

## 2019-11-17 NOTE — Telephone Encounter (Signed)
After speaking with [name], called patient's wife back and went over taper in detail..  She verbalizes u/o information and is straight on how the taper is to go and will not take any 10 mg oxycodone after 11/19/19, only 5 mg tablet to finish taper as directed.  Patient will be seen again to discuss outcome and options on 01/12/20.  Will cancel VV for this afternoon.

## 2019-11-25 ENCOUNTER — Encounter: Payer: Self-pay | Admitting: Emergency Medicine

## 2019-11-25 ENCOUNTER — Emergency Department
Admission: EM | Admit: 2019-11-25 | Discharge: 2019-11-25 | Disposition: A | Discharge status: Home / Self Care | Payer: Medicare HMO | Attending: Emergency Medicine | Admitting: Emergency Medicine

## 2019-11-25 ENCOUNTER — Other Ambulatory Visit: Payer: Self-pay

## 2019-11-25 DIAGNOSIS — Z7982 Long term (current) use of aspirin: Secondary | ICD-10-CM | POA: Diagnosis not present

## 2019-11-25 DIAGNOSIS — E039 Hypothyroidism, unspecified: Secondary | ICD-10-CM | POA: Insufficient documentation

## 2019-11-25 DIAGNOSIS — S91115A Laceration without foreign body of left lesser toe(s) without damage to nail, initial encounter: Secondary | ICD-10-CM | POA: Insufficient documentation

## 2019-11-25 DIAGNOSIS — E119 Type 2 diabetes mellitus without complications: Secondary | ICD-10-CM | POA: Diagnosis not present

## 2019-11-25 DIAGNOSIS — I10 Essential (primary) hypertension: Secondary | ICD-10-CM | POA: Insufficient documentation

## 2019-11-25 DIAGNOSIS — F17290 Nicotine dependence, other tobacco product, uncomplicated: Secondary | ICD-10-CM | POA: Insufficient documentation

## 2019-11-25 DIAGNOSIS — Z79899 Other long term (current) drug therapy: Secondary | ICD-10-CM | POA: Diagnosis not present

## 2019-11-25 DIAGNOSIS — W230XXA Caught, crushed, jammed, or pinched between moving objects, initial encounter: Secondary | ICD-10-CM | POA: Insufficient documentation

## 2019-11-25 MED ORDER — LIDOCAINE HCL 1 % IJ SOLN
5.0000 mL | Freq: Once | INTRAMUSCULAR | Status: AC
  Administered 2019-11-25: 5 mL
  Filled 2019-11-25: qty 10

## 2019-11-25 MED ORDER — CEPHALEXIN 500 MG PO CAPS: 500.0000 mg | ORAL_CAPSULE | Freq: Three times a day (TID) | ORAL | 0 refills | Status: AC

## 2019-11-25 NOTE — ED Notes (Signed)
Pt reports he caught the second toe on his left foot on the carpet as he was getting out of the shower. Pt reports he has been seen here for the same injury on the same toe twice before.   Pt reports history of foot drop on the left foot, following a back surgery in 1995

## 2019-11-25 NOTE — ED Provider Notes (Signed)
Emergency Department Provider Note  ____________________________________________  Time seen: Approximately 10:06 PM  I have reviewed the triage vital signs and the nursing notes.   HISTORY  Chief Complaint Laceration   Historian Patient     HPI Chase Mendez is a 85 y.o. male presents to the emergency department with a 2 cm laceration along the dorsal aspect of the left foot.  Patient states that he accidentally caught left foot against the carpet while he was getting out of the shower.  He denies hitting his head during injury.  No numbness or tingling of the left foot.  No similar injuries in the past.   Past Medical History:  Diagnosis Date  . Acute encephalopathy 12/08/2014  . Anxiety   . ARF (acute renal failure) ([place]) 12/08/2014  . Back pain   . Benign neoplasm of large bowel   . Capsulitis    fractured displaced metatarsal with capsulitis  . Chronic back pain   . Coronavirus infection   . Depression   . Diabetes mellitus without complication (HCC)    no medications currently  . Dysphagia   . Exostosis    painful, right hallux  . Foot drop, left    wears a brace  . Frequent falls 02/2018   poor balance, foot drop  . GERD (gastroesophageal reflux disease)   . Gout   . Hypertension   . Hypothyroidism   . Insomnia   . Low testosterone   . Microscopic hematuria 2016  . Myocardial infarction [facility])    patient unaware when it happened years ago.    . Pneumonia 12/07/2014  . Pressure ulcer 12/09/2014  . Sepsis ([place]) 12/08/2014  . Thyroid disease      Immunizations up to date:  Yes.     Past Medical History:  Diagnosis Date  . Acute encephalopathy 12/08/2014  . Anxiety   . ARF (acute renal failure) ([place]) 12/08/2014  . Back pain   . Benign neoplasm of large bowel   . Capsulitis    fractured displaced metatarsal with capsulitis  . Chronic back pain   . Coronavirus infection   . Depression   . Diabetes mellitus without complication (HCC)    no  medications currently  . Dysphagia   . Exostosis    painful, right hallux  . Foot drop, left    wears a brace  . Frequent falls 02/2018   poor balance, foot drop  . GERD (gastroesophageal reflux disease)   . Gout   . Hypertension   . Hypothyroidism   . Insomnia   . Low testosterone   . Microscopic hematuria 2016  . Myocardial infarction [facility])    patient unaware when it happened years ago.    . Pneumonia 12/07/2014  . Pressure ulcer 12/09/2014  . Sepsis ([place]) 12/08/2014  . Thyroid disease     Patient Active Problem List   Diagnosis Date Noted  . Uncomplicated opioid dependence ([place]) 10/19/2019  . Delirium 01/27/2019  . Gout of left foot due to renal impairment 12/16/2018  . Presence of neurostimulator 10/22/2018  . Spinal cord stimulator dysfunction ([place]) 10/22/2018  . Encounter for interrogation of neurostimulator 10/22/2018  . Chronic musculoskeletal pain 09/11/2018  . Neurogenic pain 09/11/2018  . Claustrophobia 01/23/2018  . Thoracic central spinal stenosis 01/22/2018  . Cellulitis of right hand 12/06/2017  . Chronic pain syndrome 07/11/2017  . DDD (degenerative disc disease), lumbar 06/19/2017  . Opioid Drug tolerance 06/10/2017  . History of acute renal failure (12/08/2014) 06/10/2017  .  History of encephalopathy (12/08/2014) 06/10/2017  . History of sepsis (12/08/2014) 06/10/2017  . Lower limb pain, inferior (L5) (Foot Drop) (Left) 05/16/2017  . Chronic low back pain (Secondary Area of Pain) (Bilateral) (ML) (L>R) with sciatica (Left) 05/16/2017  . Chronic lower extremity pain (Primary Area of Pain) (Left) 05/16/2017  . 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 Foot drop (Left) 05/16/2017  . Lumbosacral radiculopathy at L5 (Left) 05/16/2017  . Disorder of skeletal system 05/16/2017  . Pharmacologic therapy 05/16/2017  . Problems influencing health status 05/16/2017  . Venous reflux 07/12/2016  . 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 Varicose veins of both  lower extremities with pain 05/09/2016  . ARF (acute renal failure) ([place]) 12/08/2014  . Pneumonia 12/07/2014    Past Surgical History:  Procedure Laterality Date  . APPENDECTOMY    . [place]   x 2  . CHOLECYSTECTOMY    . ESOPHAGOGASTRODUODENOSCOPY (EGD) WITH PROPOFOL N/A 04/24/2017   Procedure: ESOPHAGOGASTRODUODENOSCOPY (EGD) WITH PROPOFOL;  Surgeon: Manya Silvas, MD;  Location: [facility] ENDOSCOPY;  Service: Endoscopy;  Laterality: N/A;  . EYE SURGERY Bilateral 1983, 1985   cataract extractions  . SPINAL CORD STIMULATOR INSERTION N/A 03/13/2018   Procedure: SPINAL CORD STIMULATOR INSERTION;  Surgeon: Meade Maw, MD;  Location: [facility] ORS;  Service: Neurosurgery;  Laterality: N/A;    Prior to Admission medications   Medication Sig Start Date End Date Taking? Authorizing Provider  allopurinol (ZYLOPRIM) 100 MG tablet Take 100 mg by mouth daily. 10/26/14   [provider]  amLODipine (NORVASC) 10 MG tablet Take 10 mg by mouth daily.    [provider]  aspirin 81 MG tablet Take 81 mg by mouth daily.    [provider]  atorvastatin (LIPITOR) 20 MG tablet Take 20 mg by mouth daily.     [provider]  cephALEXin (KEFLEX) 500 MG capsule Take 1 capsule (500 mg total) by mouth 3 (three) times daily for 7 days. 11/25/19 12/02/19  Lannie Fields, PA-C  citalopram (CELEXA) 40 MG tablet Take 40 mg by mouth daily.     [provider]  fluticasone (FLONASE) 50 MCG/ACT nasal spray Place 1 spray into both nostrils daily as needed for rhinitis.     [provider]  gabapentin (NEURONTIN) 600 MG tablet Take 1 tablet (600 mg total) by mouth 2 (two) times daily. 07/23/19 01/19/20  Milinda Pointer, MD  isosorbide mononitrate (IMDUR) 30 MG 24 hr tablet Take 30 mg by mouth daily.    [provider]  levothyroxine (SYNTHROID, LEVOTHROID) 125 MCG tablet Take 150 mcg by mouth daily before breakfast.     [provider]  linaclotide (LINZESS) 290 MCG CAPS capsule Take 290 mcg by mouth daily as needed (for constipation).     [provider]  losartan (COZAAR) 100 MG tablet Take 100 mg by mouth daily.     [provider]  naloxone [facility]) nasal spray 4 mg/0.1 mL Spray into one nostril. Repeat with second device into other nostril after 2-3 minutes if no or minimal response. Use in case of opioid overdose. Patient taking differently: Place 1 spray into the nose once. Spray into one nostril. Repeat with second device into other nostril after 2-3 minutes if no or minimal response. Use in case of opioid overdose. 09/04/17   Vevelyn Francois, NP  ondansetron (ZOFRAN ODT) 4 MG disintegrating tablet Take 1 tablet (4 mg total) by mouth every 8 (eight) hours as needed  for nausea or vomiting. 08/21/16   Darel Hong, MD  oxyCODONE (OXY IR/ROXICODONE) 5 MG immediate release tablet Take 2 tablets (10 mg total) by mouth 3 (three) times daily AND 1 tablet (5 mg total) daily with lunch. Do all this for 7 days. For the next 7 days take 2 tab with breakfast, dinner, and at bedtime. Take only 1 tab at noon. Max: 7/day. Must last 7 days.. 11/20/19 11/27/19  Milinda Pointer, MD  oxyCODONE (OXY IR/ROXICODONE) 5 MG immediate release tablet Take 1 tablet (5 mg total) by mouth 6 (six) times daily for 7 days. Max: 6/day. Must last 7 days. 11/27/19 12/04/19  Milinda Pointer, MD  oxyCODONE (OXY IR/ROXICODONE) 5 MG immediate release tablet Take 1 tablet (5 mg total) by mouth 5 (five) times daily for 7 days. Max: 5/day. Must last 7 days. 12/04/19 12/11/19  Milinda Pointer, MD  oxyCODONE (OXY IR/ROXICODONE) 5 MG immediate release tablet Take 1 tablet (5 mg total) by mouth 4 (four) times daily for 7 days. Max: 4/day. Must last 7 days. 12/11/19 12/18/19  Milinda Pointer, MD  oxyCODONE (OXY IR/ROXICODONE) 5 MG immediate release tablet Take 1 tablet (5 mg total) by mouth 3 (three) times daily for 7 days. Max: 3/day. Must last  7 days. 12/18/19 12/25/19  Milinda Pointer, MD  oxyCODONE (OXY IR/ROXICODONE) 5 MG immediate release tablet Take 1 tablet (5 mg total) by mouth 2 (two) times daily for 7 days. Max: 2/day. Must last 7 days. 12/25/19 01/01/20  Milinda Pointer, MD  oxyCODONE (OXY IR/ROXICODONE) 5 MG immediate release tablet Take 1 tablet (5 mg total) by mouth daily for 7 days. Max: 1/day. Must last 7 days. 01/01/20 01/08/20  Milinda Pointer, MD  pantoprazole (PROTONIX) 40 MG tablet Take 40 mg by mouth daily.  02/04/18 02/17/19  [provider]  tamsulosin (FLOMAX) 0.4 MG CAPS capsule Take 0.4 mg by mouth daily. 10/01/19   [provider]  Testosterone (ANDROGEL) 40.5 MG/2.5GM (1.62%) GEL Place 2 Pump onto the skin 3 (three) times a week.    [provider]  vitamin B-12 (CYANOCOBALAMIN) 1000 MCG tablet Take 1,000 mcg by mouth daily.    [provider]  vitamin E 400 UNIT capsule Take 400 Units by mouth daily.    [provider]  diphenhydrAMINE (BENADRYL) 25 MG tablet Take 25 mg by mouth 2 (two) times daily.   01/27/19  [provider]    Allergies Ibuprofen and Sulfa antibiotics  Family History  Problem Relation Age of Onset  . Heart disease Mother   . Diabetes Father   . Kidney cancer Brother     Social History Social History   Tobacco Use  . Smoking status: Current Every Day Smoker    Packs/day: 0.50    Years: 70.00    Pack years: 35.00    Types: Cigars  . Smokeless tobacco: Never Used  . Tobacco comment: unable to give cessation materials due to webex visit   Vaping Use  . Vaping Use: Never used  Substance Use Topics  . Alcohol use: No  . Drug use: Yes    Types: Oxycodone     Review of Systems  Constitutional: No fever/chills Eyes:  No discharge ENT: No upper respiratory complaints. Respiratory: no cough. No SOB/ use of accessory muscles to breath Gastrointestinal:   No nausea, no vomiting.  No diarrhea.  No  constipation. Musculoskeletal: Negative for musculoskeletal pain. Skin: Patient has laceration.    ____________________________________________   PHYSICAL EXAM:  VITAL SIGNS:  ED Triage Vitals  Enc Vitals Group     BP 11/25/19 2048 (!) 116/59     Pulse Rate 11/25/19 2048 63     Resp 11/25/19 2048 17     Temp 11/25/19 2048 98.1 F (36.7 C)     Temp Source 11/25/19 2048 Oral     SpO2 11/25/19 2048 98 %     Weight 11/25/19 2049 197 lb (89.4 kg)     Height 11/25/19 2049 6' (1.829 m)     Head Circumference --      Peak Flow --      Pain Score 11/25/19 2102 4     Pain Loc --      Pain Edu? --      Excl. in [place]? --      Constitutional: Alert and oriented. Well appearing and in no acute distress. Eyes: Conjunctivae are normal. PERRL. EOMI. Head: Atraumatic. Cardiovascular: Normal rate, regular rhythm. Normal S1 and S2.  Good peripheral circulation. Respiratory: Normal respiratory effort without tachypnea or retractions. Lungs CTAB. Good air entry to the bases with no decreased or absent breath sounds Gastrointestinal: Bowel sounds x 4 quadrants. Soft and nontender to palpation. No guarding or rigidity. No distention. Musculoskeletal: Full range of motion to all extremities. No obvious deformities noted Neurologic:  Normal for age. No gross focal neurologic deficits are appreciated.  Skin: Patient has 2 cm laceration along the dorsal aspect of the left second toe deep to underlying dermis.  Psychiatric: Mood and affect are normal for age. Speech and behavior are normal.   ____________________________________________   LABS (all labs ordered are listed, but only abnormal results are displayed)  Labs Reviewed - No data to display ____________________________________________  EKG   ____________________________________________  RADIOLOGY   No results found.  ____________________________________________    PROCEDURES  Procedure(s) performed:     Marland KitchenMarland KitchenLaceration  Repair  Date/Time: 11/25/2019 10:11 PM Performed by: Lannie Fields, PA-C Authorized by: Lannie Fields, PA-C   Consent:    Consent obtained:  Verbal   Consent given by:  Patient Anesthesia (see MAR for exact dosages):    Anesthesia method:  None Laceration details:    Location:  Toe   Toe location:  L second toe   Length (cm):  2   Depth (mm):  5 Repair type:    Repair type:  Simple Exploration:    Contaminated: no   Treatment:    Area cleansed with:  Betadine   Amount of cleaning:  Standard   Irrigation solution:  Sterile saline   Visualized foreign bodies/material removed: no   Skin repair:    Repair method:  Sutures   Suture size:  4-0   Suture technique:  Simple interrupted   Number of sutures:  5 Approximation:    Approximation:  Close Post-procedure details:    Dressing:  Open (no dressing)   Patient tolerance of procedure:  Tolerated well, no immediate complications       Medications  lidocaine (XYLOCAINE) 1 % (with pres) injection 5 mL (5 mLs Infiltration Given 11/25/19 2125)     ____________________________________________   INITIAL IMPRESSION / ASSESSMENT AND PLAN / ED COURSE  Pertinent labs & imaging results that were available during my care of the patient were reviewed by me and considered in my medical decision making (see chart for details).      Assessment and Plan:  Laceration repair:  85 year old male presents to the emergency department with a 2 cm laceration along the dorsal aspect  of the left second toe repaired in the emergency department without complication.  Patient was advised to have external sutures removed in 7 days.  Patient was discharged with Keflex.  Return precautions were given to return with new or worsening symptoms.    ____________________________________________  FINAL CLINICAL IMPRESSION(S) / ED DIAGNOSES  Final diagnoses:  Laceration of lesser toe of left foot without foreign body present or damage to nail,  initial encounter      NEW MEDICATIONS STARTED DURING THIS VISIT:  ED Discharge Orders         Ordered    cephALEXin (KEFLEX) 500 MG capsule  3 times daily        11/25/19 2148              This chart was dictated using voice recognition software/Dragon. Despite best efforts to proofread, errors can occur which can change the meaning. Any change was purely unintentional.     Lannie Fields, PA-C 11/25/19 2214    Vladimir Crofts, MD 11/25/19 [phone]

## 2019-11-25 NOTE — Discharge Instructions (Addendum)
Take Keflex three times daily for the next seven days.  

## 2019-11-25 NOTE — ED Triage Notes (Signed)
Pt arrived via POV reports he cut 2nd toe on the L foot, pt has foot drop which is not new, states his foot got caught in the carpet and caused laceration between great toe and 2nd toe.

## 2019-12-05 ENCOUNTER — Other Ambulatory Visit: Payer: Self-pay

## 2019-12-05 ENCOUNTER — Emergency Department
Admission: EM | Admit: 2019-12-05 | Discharge: 2019-12-05 | Disposition: A | Discharge status: Home / Self Care | Payer: Medicare HMO | Attending: Emergency Medicine | Admitting: Emergency Medicine

## 2019-12-05 ENCOUNTER — Encounter: Payer: Self-pay | Admitting: Emergency Medicine

## 2019-12-05 DIAGNOSIS — Z8616 Personal history of COVID-19: Secondary | ICD-10-CM | POA: Diagnosis not present

## 2019-12-05 DIAGNOSIS — Z79899 Other long term (current) drug therapy: Secondary | ICD-10-CM | POA: Diagnosis not present

## 2019-12-05 DIAGNOSIS — Z7982 Long term (current) use of aspirin: Secondary | ICD-10-CM | POA: Insufficient documentation

## 2019-12-05 DIAGNOSIS — E039 Hypothyroidism, unspecified: Secondary | ICD-10-CM | POA: Diagnosis not present

## 2019-12-05 DIAGNOSIS — I10 Essential (primary) hypertension: Secondary | ICD-10-CM | POA: Insufficient documentation

## 2019-12-05 DIAGNOSIS — E119 Type 2 diabetes mellitus without complications: Secondary | ICD-10-CM | POA: Insufficient documentation

## 2019-12-05 DIAGNOSIS — Z4802 Encounter for removal of sutures: Secondary | ICD-10-CM | POA: Diagnosis not present

## 2019-12-05 DIAGNOSIS — S91115D Laceration without foreign body of left lesser toe(s) without damage to nail, subsequent encounter: Secondary | ICD-10-CM | POA: Insufficient documentation

## 2019-12-05 DIAGNOSIS — W2209XD Striking against other stationary object, subsequent encounter: Secondary | ICD-10-CM | POA: Diagnosis not present

## 2019-12-05 DIAGNOSIS — Z85038 Personal history of other malignant neoplasm of large intestine: Secondary | ICD-10-CM | POA: Insufficient documentation

## 2019-12-05 DIAGNOSIS — F17290 Nicotine dependence, other tobacco product, uncomplicated: Secondary | ICD-10-CM | POA: Insufficient documentation

## 2019-12-05 NOTE — ED Triage Notes (Signed)
Pt comes in for suture removal.  Pt denies any pain, swelling, or problems related to the sutures.  Pt attempted to go to walk in clinic where they explained they had no appts available.

## 2019-12-05 NOTE — ED Notes (Signed)
Pt unable to sign for d/c due to inaccessibility to topaz signature pad. Pt verbalized d/c instructions with no further questions at this time.

## 2019-12-05 NOTE — ED Provider Notes (Signed)
[facility] Emergency Department Provider Note  ____________________________________________   First MD Initiated Contact with Patient 12/05/19 1639     (approximate)  I have reviewed the triage vital signs and the nursing notes.   HISTORY  Chief Complaint Suture / Staple Removal   HPI Chase Mendez is a 85 y.o. male with below noted past medical history who presents for assessment requesting suture removal to his left second toe.  Patient states he injured the toe on the 19th and came to this emergency room where 5 sutures were placed over the top of his left second toe.  He states he does not have any other injuries from when he struck the toe and sustained a laceration.  States that wound has otherwise been healing well and has not been bleeding or draining.  He denies any other acute concerns at this time.  No fevers, chills, cough, chest pain, shortness of breath, abdominal pain, back pain, rash, or other recent traumatic injuries.         Past Medical History:  Diagnosis Date  . Acute encephalopathy 12/08/2014  . Anxiety   . ARF (acute renal failure) ([place]) 12/08/2014  . Back pain   . Benign neoplasm of large bowel   . Capsulitis    fractured displaced metatarsal with capsulitis  . Chronic back pain   . Coronavirus infection   . Depression   . Diabetes mellitus without complication (HCC)    no medications currently  . Dysphagia   . Exostosis    painful, right hallux  . Foot drop, left    wears a brace  . Frequent falls 02/2018   poor balance, foot drop  . GERD (gastroesophageal reflux disease)   . Gout   . Hypertension   . Hypothyroidism   . Insomnia   . Low testosterone   . Microscopic hematuria 2016  . Myocardial infarction [facility])    patient unaware when it happened years ago.    . Pneumonia 12/07/2014  . Pressure ulcer 12/09/2014  . Sepsis ([place]) 12/08/2014  . Thyroid disease     Patient Active Problem List   Diagnosis Date  Noted  . Uncomplicated opioid dependence ([place]) 10/19/2019  . Delirium 01/27/2019  . Gout of left foot due to renal impairment 12/16/2018  . Presence of neurostimulator 10/22/2018  . Spinal cord stimulator dysfunction ([place]) 10/22/2018  . Encounter for interrogation of neurostimulator 10/22/2018  . Chronic musculoskeletal pain 09/11/2018  . Neurogenic pain 09/11/2018  . Claustrophobia 01/23/2018  . Thoracic central spinal stenosis 01/22/2018  . Cellulitis of right hand 12/06/2017  . Chronic pain syndrome 07/11/2017  . DDD (degenerative disc disease), lumbar 06/19/2017  . Opioid Drug tolerance 06/10/2017  . History of acute renal failure (12/08/2014) 06/10/2017  . History of encephalopathy (12/08/2014) 06/10/2017  . History of sepsis (12/08/2014) 06/10/2017  . Lower limb pain, inferior (L5) (Foot Drop) (Left) 05/16/2017  . Chronic low back pain (Secondary Area of Pain) (Bilateral) (ML) (L>R) with sciatica (Left) 05/16/2017  . Chronic lower extremity pain (Primary Area of Pain) (Left) 05/16/2017  . 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 Foot drop (Left) 05/16/2017  . Lumbosacral radiculopathy at L5 (Left) 05/16/2017  . Disorder of skeletal system 05/16/2017  . Pharmacologic therapy 05/16/2017  . Problems influencing health status 05/16/2017  . Venous reflux 07/12/2016  . 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 Varicose veins of both lower extremities with pain 05/09/2016  . ARF (acute renal failure) ([place]) 12/08/2014  .  Pneumonia 12/07/2014    Past Surgical History:  Procedure Laterality Date  . APPENDECTOMY    . [place]   x 2  . CHOLECYSTECTOMY    . ESOPHAGOGASTRODUODENOSCOPY (EGD) WITH PROPOFOL N/A 04/24/2017   Procedure: ESOPHAGOGASTRODUODENOSCOPY (EGD) WITH PROPOFOL;  Surgeon: Manya Silvas, MD;  Location: [facility] ENDOSCOPY;  Service: Endoscopy;  Laterality: N/A;  . EYE SURGERY Bilateral 1983, 1985   cataract extractions  . SPINAL CORD STIMULATOR  INSERTION N/A 03/13/2018   Procedure: SPINAL CORD STIMULATOR INSERTION;  Surgeon: Meade Maw, MD;  Location: [facility] ORS;  Service: Neurosurgery;  Laterality: N/A;    Prior to Admission medications   Medication Sig Start Date End Date Taking? Authorizing Provider  allopurinol (ZYLOPRIM) 100 MG tablet Take 100 mg by mouth daily. 10/26/14   [provider]  amLODipine (NORVASC) 10 MG tablet Take 10 mg by mouth daily.    [provider]  aspirin 81 MG tablet Take 81 mg by mouth daily.    [provider]  atorvastatin (LIPITOR) 20 MG tablet Take 20 mg by mouth daily.     [provider]  citalopram (CELEXA) 40 MG tablet Take 40 mg by mouth daily.     [provider]  fluticasone (FLONASE) 50 MCG/ACT nasal spray Place 1 spray into both nostrils daily as needed for rhinitis.     [provider]  gabapentin (NEURONTIN) 600 MG tablet Take 1 tablet (600 mg total) by mouth 2 (two) times daily. 07/23/19 01/19/20  Milinda Pointer, MD  isosorbide mononitrate (IMDUR) 30 MG 24 hr tablet Take 30 mg by mouth daily.    [provider]  levothyroxine (SYNTHROID, LEVOTHROID) 125 MCG tablet Take 150 mcg by mouth daily before breakfast.     [provider]  linaclotide (LINZESS) 290 MCG CAPS capsule Take 290 mcg by mouth daily as needed (for constipation).     [provider]  losartan (COZAAR) 100 MG tablet Take 100 mg by mouth daily.     [provider]  naloxone [facility]) nasal spray 4 mg/0.1 mL Spray into one nostril. Repeat with second device into other nostril after 2-3 minutes if no or minimal response. Use in case of opioid overdose. Patient taking differently: Place 1 spray into the nose once. Spray into one nostril. Repeat with second device into other nostril after 2-3 minutes if no or minimal response. Use in case of opioid overdose. 09/04/17   Vevelyn Francois, NP  ondansetron (ZOFRAN ODT) 4 MG disintegrating tablet  Take 1 tablet (4 mg total) by mouth every 8 (eight) hours as needed for nausea or vomiting. 08/21/16   Darel Hong, MD  oxyCODONE (OXY IR/ROXICODONE) 5 MG immediate release tablet Take 2 tablets (10 mg total) by mouth 3 (three) times daily AND 1 tablet (5 mg total) daily with lunch. Do all this for 7 days. For the next 7 days take 2 tab with breakfast, dinner, and at bedtime. Take only 1 tab at noon. Max: 7/day. Must last 7 days.. 11/20/19 11/27/19  Milinda Pointer, MD  oxyCODONE (OXY IR/ROXICODONE) 5 MG immediate release tablet Take 1 tablet (5 mg total) by mouth 6 (six) times daily for 7 days. Max: 6/day. Must last 7 days. 11/27/19 12/04/19  Milinda Pointer, MD  oxyCODONE (OXY IR/ROXICODONE) 5 MG immediate release tablet Take 1 tablet (5 mg total) by mouth 5 (five) times daily for 7 days. Max: 5/day. Must last 7 days. 12/04/19 12/11/19  Milinda Pointer, MD  oxyCODONE (  OXY IR/ROXICODONE) 5 MG immediate release tablet Take 1 tablet (5 mg total) by mouth 4 (four) times daily for 7 days. Max: 4/day. Must last 7 days. 12/11/19 12/18/19  Milinda Pointer, MD  oxyCODONE (OXY IR/ROXICODONE) 5 MG immediate release tablet Take 1 tablet (5 mg total) by mouth 3 (three) times daily for 7 days. Max: 3/day. Must last 7 days. 12/18/19 12/25/19  Milinda Pointer, MD  oxyCODONE (OXY IR/ROXICODONE) 5 MG immediate release tablet Take 1 tablet (5 mg total) by mouth 2 (two) times daily for 7 days. Max: 2/day. Must last 7 days. 12/25/19 01/01/20  Milinda Pointer, MD  oxyCODONE (OXY IR/ROXICODONE) 5 MG immediate release tablet Take 1 tablet (5 mg total) by mouth daily for 7 days. Max: 1/day. Must last 7 days. 01/01/20 01/08/20  Milinda Pointer, MD  pantoprazole (PROTONIX) 40 MG tablet Take 40 mg by mouth daily.  02/04/18 02/17/19  [provider]  tamsulosin (FLOMAX) 0.4 MG CAPS capsule Take 0.4 mg by mouth daily. 10/01/19   [provider]  Testosterone (ANDROGEL) 40.5 MG/2.5GM (1.62%) GEL  Place 2 Pump onto the skin 3 (three) times a week.    [provider]  vitamin B-12 (CYANOCOBALAMIN) 1000 MCG tablet Take 1,000 mcg by mouth daily.    [provider]  vitamin E 400 UNIT capsule Take 400 Units by mouth daily.    [provider]  diphenhydrAMINE (BENADRYL) 25 MG tablet Take 25 mg by mouth 2 (two) times daily.   01/27/19  [provider]    Allergies Ibuprofen and Sulfa antibiotics  Family History  Problem Relation Age of Onset  . Heart disease Mother   . Diabetes Father   . Kidney cancer Brother     Social History Social History   Tobacco Use  . Smoking status: Current Every Day Smoker    Packs/day: 0.50    Years: 70.00    Pack years: 35.00    Types: Cigars  . Smokeless tobacco: Never Used  . Tobacco comment: unable to give cessation materials due to webex visit   Vaping Use  . Vaping Use: Never used  Substance Use Topics  . Alcohol use: No  . Drug use: Yes    Types: Oxycodone    Review of Systems  Review of Systems  Constitutional: Negative for chills and fever.  HENT: Negative for sore throat.   Eyes: Negative for pain.  Respiratory: Negative for cough and stridor.   Cardiovascular: Negative for chest pain.  Gastrointestinal: Negative for vomiting.  Genitourinary: Negative for dysuria.  Skin: Negative for rash.  Neurological: Negative for seizures, loss of consciousness and headaches.  Psychiatric/Behavioral: Negative for suicidal ideas.  All other systems reviewed and are negative.     ____________________________________________   PHYSICAL EXAM:  VITAL SIGNS: ED Triage Vitals  Enc Vitals Group     BP 12/05/19 1613 (!) 103/51     Pulse Rate 12/05/19 1613 60     Resp 12/05/19 1613 18     Temp 12/05/19 1613 98.7 F (37.1 C)     Temp Source 12/05/19 1613 Oral     SpO2 12/05/19 1613 96 %     Weight --      Height --      Head Circumference --      Peak Flow --      Pain Score 12/05/19 1449 0      Pain Loc --      Pain Edu? --      Excl.  in [place]? --    Vitals:   12/05/19 1613  BP: (!) 103/51  Pulse: 60  Resp: 18  Temp: 98.7 F (37.1 C)  SpO2: 96%   Physical Exam Vitals and nursing note reviewed.  Constitutional:      Appearance: He is well-developed.  HENT:     Head: Normocephalic and atraumatic.     Right Ear: External ear normal.     Left Ear: External ear normal.     Nose: Nose normal.  Eyes:     Conjunctiva/sclera: Conjunctivae normal.  Cardiovascular:     Rate and Rhythm: Normal rate and regular rhythm.     Heart sounds: No murmur heard.   Pulmonary:     Effort: Pulmonary effort is normal. No respiratory distress.     Breath sounds: Normal breath sounds.  Abdominal:     Palpations: Abdomen is soft.     Tenderness: There is no abdominal tenderness.  Musculoskeletal:     Cervical back: Neck supple.  Skin:    General: Skin is warm and dry.  Neurological:     Mental Status: He is alert and oriented to person, place, and time.  Psychiatric:        Mood and Affect: Mood normal.      ____________________________________________   LABS (all labs ordered are listed, but only abnormal results are displayed)  Labs Reviewed - No data to display ____________________________________________  ____________________________________________   PROCEDURES  Procedure(s) performed (including Critical Care):  .Suture Removal  Date/Time: 12/05/2019 4:44 PM Performed by: Lucrezia Starch, MD Authorized by: Lucrezia Starch, MD   Consent:    Consent obtained:  Verbal   Consent given by:  Patient   Risks discussed:  Pain and wound separation Location:    Location:  Lower extremity   Lower extremity location:  Toe   Toe location:  L second toe Procedure details:    Wound appearance:  No signs of infection   Number of sutures removed:  5 Post-procedure details:    Patient tolerance of procedure:  Tolerated well, no immediate  complications     ____________________________________________   INITIAL IMPRESSION / ASSESSMENT AND PLAN / ED COURSE        Patient presents with [place] to history exam requesting suture removal from the [facility] sustained to the dorsal aspect of the distal tip of his left second toe on the 19th.  Patient is otherwise asymptomatic and hemodynamically stable arrival.  The wound appears to be healing appropriately without signs of infection.  Sutures removed per procedure note above.  Patient discharged stable condition.  Strict return cautions advised and discussed with regarding expected clinical course of patient's wound care.  ____________________________________________   FINAL CLINICAL IMPRESSION(S) / ED DIAGNOSES  Final diagnoses:  Visit for suture removal    Medications - No data to display   ED Discharge Orders    None       Note:  This document was prepared using Dragon voice recognition software and may include unintentional dictation errors.   Lucrezia Starch, MD 12/05/19 [phone]

## 2019-12-16 ENCOUNTER — Other Ambulatory Visit: Payer: Self-pay | Admitting: Ophthalmology

## 2019-12-16 ENCOUNTER — Other Ambulatory Visit (HOSPITAL_COMMUNITY): Payer: Self-pay | Admitting: Ophthalmology

## 2019-12-16 DIAGNOSIS — H5340 Unspecified visual field defects: Secondary | ICD-10-CM

## 2019-12-28 NOTE — Progress Notes (Signed)
No show

## 2019-12-29 ENCOUNTER — Ambulatory Visit
Admission: RE | Admit: 2019-12-29 | Discharge: 2019-12-29 | Disposition: A | Discharge status: Home / Self Care | Payer: Medicare HMO | Source: Ambulatory Visit | Attending: Ophthalmology | Admitting: Ophthalmology

## 2019-12-29 ENCOUNTER — Other Ambulatory Visit: Payer: Self-pay

## 2019-12-29 ENCOUNTER — Telehealth: Payer: Self-pay | Admitting: Pain Medicine

## 2019-12-29 DIAGNOSIS — H5340 Unspecified visual field defects: Secondary | ICD-10-CM

## 2019-12-29 LAB — POCT I-STAT CREATININE: Creatinine, Ser: 1.3 mg/dL — ABNORMAL HIGH (ref 0.61–1.24)

## 2019-12-29 MED ORDER — IOHEXOL 300 MG/ML  SOLN
75.0000 mL | Freq: Once | INTRAMUSCULAR | Status: AC | PRN
  Administered 2019-12-29: 75 mL via INTRAVENOUS

## 2019-12-29 NOTE — Telephone Encounter (Signed)
Spoke with patients wife. She states that that patient says that he cant do this.  Wife states that she feels like it is not pain related because he is not sitting on the side of the bed complaining of pain.  States that he is still having nausea and is going for a CT scan this afternoon.  Spoke with [name].  States to continue to encourage patient and to seek out PCP for continuing nausea.  Encouraged patient to call us for any further questions or concerns.

## 2019-12-29 NOTE — Telephone Encounter (Signed)
Patient having a very hard time with the drug holiday. He is down to 1 tab every 12 hours. Patient states he is in extreme pain. Is there something that will help with the withdrawal. Please call asap

## 2020-01-08 ENCOUNTER — Telehealth: Payer: Self-pay | Admitting: *Deleted

## 2020-01-08 NOTE — Telephone Encounter (Signed)
Ok. Please cancel per pt's request

## 2020-01-08 NOTE — Telephone Encounter (Signed)
Appointment cancelled and patient notified. 

## 2020-01-08 NOTE — Telephone Encounter (Signed)
Patient wife called stating that patient has appointment in January, but that Dr Elijio Miles checked labs on him and his platelet count was 201. She is asking if that appointment can be cancelled and just let Dr Elijio Miles follow it. Please advise

## 2020-01-09 ENCOUNTER — Telehealth: Payer: Self-pay | Admitting: Student in an Organized Health Care Education/Training Program

## 2020-01-09 NOTE — Telephone Encounter (Signed)
Voicemail left with Earlie Server that I feel we should reassess on Monday and if he is still ill or running a fever that we will accommodate that either with rescheduling or a VV.

## 2020-01-09 NOTE — Telephone Encounter (Signed)
Ante is sick with cold and maybe pneumonia, he is on antibiotics and they want to know if he should still come in for appt on Monday. Please call

## 2020-01-11 NOTE — Progress Notes (Addendum)
Patient: Chase Mendez  Service Category: E/M  Provider: Gaspar Cola, MD  DOB: 18-Mar-1934  DOS: 01/12/2020  Location: Office  MRN: [id number]  Setting: Ambulatory outpatient  Referring Provider: Jodi Marble, MD  Type: Established Patient  Specialty: Interventional Pain Management  PCP: Jodi Marble, MD  Location: Remote location  Delivery: TeleHealth     Virtual Encounter - Pain Management PROVIDER NOTE: Information contained herein reflects review and annotations entered in association with encounter. Interpretation of such information and data should be left to medically-trained personnel. Information provided to patient can be located elsewhere in the medical record under "Patient Instructions". Document created using STT-dictation technology, any transcriptional errors that may result from process are unintentional.    Contact & Pharmacy Preferred: [phone] Home: [phone] (home) Mobile: There is no such number on file (mobile). E-mail: [email or web address]  [facility] - [place], [place] - [address] [address] Phone: [phone] Fax: [phone]  [id number] [id number]: [phone]: [phone]  Pre-screening  [name] "in-person" vs "virtual" encounter. He indicated preferring virtual for this encounter.   Reason COVID-19*  Social distancing based on CDC and AMA recommendations.   Mendez contacted Chase Ferrieron 01/12/2020 via telephone.      Mendez clearly identified myself as FGaspar Cola MD. Mendez verified that Mendez was speaking with the correct person using two identifiers (Name: Chase Mendez and date of birth: 3Jul 06, 1936.  Consent Mendez sought verbal advanced consent from Chase Ferrierfor virtual visit interactions. Mendez informed [name], risks, and limitations associated with  providing "not-in-person" medical evaluation and management services. Mendez also informed Mr. RHensonof the availability of "in-person" appointments. Finally, Mendez informed him that there would be a charge for the virtual visit and that he could be  personally, fully or partially, financially responsible for it. [name] understanding and agreed to proceed.   Historic Elements   Mr. Chase SAPUTOis a 85y.o. year old, male patient evaluated today after our last contact on 12/29/2019. [name] has a past medical history of Acute encephalopathy (12/08/2014), Anxiety, ARF (acute renal failure) (Chase Mendez (12/08/2014), Back pain, Benign neoplasm of large bowel, Capsulitis, Chronic back pain, Coronavirus infection, Depression, Diabetes mellitus without complication ([place], Dysphagia, Exostosis, Foot drop, left, Frequent falls (02/2018), GERD (gastroesophageal reflux disease), Gout, Hypertension, Hypothyroidism, Insomnia, Low testosterone, Microscopic hematuria (2016), Myocardial infarction ([place], Pneumonia (12/07/2014), Pressure ulcer (12/09/2014), Sepsis ([place] (12/08/2014), and Thyroid disease. He also  has a past surgical history that includes Cholecystectomy; Esophagogastroduodenoscopy (egd) with propofol (N/A, 04/24/2017); Back surgery (1995, 1996); Eye surgery (Bilateral, 1983, 1985); Appendectomy; and Spinal cord stimulator insertion (N/A, 03/13/2018). Mr. RDuranhas a current medication list which includes the following prescription(s): allopurinol, amlodipine, aspirin, atorvastatin, benzonatate, citalopram, fluticasone, gabapentin, isosorbide mononitrate, levothyroxine, linaclotide, losartan, ondansetron, [START ON 01/21/2020] oxycodone hcl, tamsulosin, testosterone, vitamin b-12, vitamin e, naloxone, pantoprazole, and [DISCONTINUED] diphenhydramine. He  reports that he has been smoking cigars. He has a 35.00 pack-year smoking history. He has never used smokeless tobacco. He reports current drug use. Drug:  Oxycodone. He reports that he does not drink alcohol. Mr. RMcaffeeis allergic to ibuprofen and sulfa antibiotics.   HPI  Today, he is being contacted for medication management. The patient returns today after having completed a downward taper of his  opioid analgesics.  This was completed on December 1.  The drug holiday itself will be completed by December 15.  Today Mendez had an opportunity to speak to the patient his wife.  She indicates that he is extremely depressed.  He is currently taking Celexa 40 mg, 1 tab p.o. daily.  Mendez asked him if they had try duloxetine (Cymbalta), but they indicated they have never tried it.  He took his last oxycodone IR 5 mg tablet on December 1 and his wife indicates that she thinks he is doing well.  However, from the medical standpoint, he has been having a pneumonia and apparently he is having a postnasal drip that keeps him up all night long coughing.  Of course this has not really helped in controlling some of his pain.  He also indicated that his skin feels very sensitive in this is not allowing him to properly use the spinal cord stimulator.  They indicated that he used it for a little while, but then had to turn it off.  Mendez will be scheduling him to come in for a face-to-face medication management visit in approximately 1 month at which time Mendez will try to coordinate the patient to see the [employer] representatives so asked to see if they can reprogram the device using high-frequency stimulation so that he will not be feeling the tingling sensation that currently appears to annoy him.  Regarding the Cymbalta, Mendez will sent a letter to Dr. Elijio Mendez to see if he would not mind perhaps switching him from the Celexa 40 mg tablet, 1 tab p.o. daily, which appears not to be effective in controlling his depression, to a trial of Cymbalta 20 mg tablet, 1 tab p.o. daily with the aim of increasing it as tolerated up to 60 mg/day.  Mendez think this may help not only with the depression, but  also with the patient's pain thereby helping to keep his opioid dose down.  According to the patient's wife, he used to see a psychiatrist, but he stopped seeing her and she indicated that she will stay on top of this and if she feels that he is not improving, she will go ahead and make an appointment with a psychiatrist to help him with the depression.  Pharmacotherapy Assessment  Analgesic: Oxycodone IR 15 mg, 1 tab PO q 6 hrs. (60 mg/day of oxycodone) MME/day:29m/day.   Monitoring: [place] PMP: PDMP reviewed during this encounter.       Pharmacotherapy: No side-effects or adverse reactions reported. Compliance: No problems identified. Effectiveness: Clinically acceptable. Plan: Refer to "POC".  UDS:  Summary  Date Value Ref Range Status  07/23/2019 Note  Final    Comment:    ==================================================================== [employer] Select 13 (MW) ==================================================================== Test                             Result       Flag       Units  Drug Present and Declared for Prescription Verification   Oxycodone                      >3861        EXPECTED   ng/mg creat   Oxymorphone                    2135         EXPECTED   ng/mg creat   Noroxycodone                   >  3861        EXPECTED   ng/mg creat   Noroxymorphone                 1084         EXPECTED   ng/mg creat    Sources of oxycodone are scheduled prescription medications.    Oxymorphone, noroxycodone, and noroxymorphone are expected    metabolites of oxycodone. Oxymorphone is also available as a    scheduled prescription medication.  ==================================================================== Test                      Result    Flag   Units      Ref Range   Creatinine              259              mg/dL      >=20 ==================================================================== Declared Medications:  The flagging and interpretation on this report are  based on the  following declared medications.  Unexpected results may arise from  inaccuracies in the declared medications.   **Note: The testing scope of this panel includes these medications:   Oxycodone (Roxicodone)   **Note: The testing scope of this panel does not include the  following reported medications:   Allopurinol (Zyloprim)  Amlodipine (Norvasc)  Aspirin  Atorvastatin (Lipitor)  Citalopram (Celexa)  Cyanocobalamin  Fluticasone (Flonase)  Gabapentin (Neurontin)  Isosorbide (Imdur)  Levothyroxine (Synthroid)  Linaclotide (Linzess)  Losartan (Cozaar)  Mirabegron (Myrbetriq)  Naloxone (Narcan)  Ondansetron (Zofran)  Pantoprazole (Protonix)  Testosterone  Vitamin E ==================================================================== For clinical consultation, please call [phone]. ====================================================================     Laboratory Chemistry Profile   Renal Lab Results  Component Value Date   BUN 17 01/28/2019   CREATININE 1.30 (H) 12/29/2019   GFRAA >60 01/28/2019   GFRNONAA >60 01/28/2019     Hepatic Lab Results  Component Value Date   AST 75 (H) 11/28/2018   ALT 20 11/28/2018   ALBUMIN 3.9 11/28/2018   ALKPHOS 134 (H) 11/28/2018   HCVAB <0.1 07/24/2018   LIPASE 37 08/21/2016     Electrolytes Lab Results  Component Value Date   NA 140 01/28/2019   K 4.0 01/28/2019   CL 105 01/28/2019   CALCIUM 8.9 01/28/2019     Bone No results found for: VD25OH, VD125OH2TOT, YQ6578IO9, GE9528UX3, 25OHVITD1, 25OHVITD2, 25OHVITD3, TESTOFREE, TESTOSTERONE   Inflammation (CRP: Acute Phase) (ESR: Chronic Phase) Lab Results  Component Value Date   LATICACIDVEN 1.1 12/08/2014       Note: Above Lab results reviewed.  Imaging  CT HEAD W & WO CONTRAST CLINICAL DATA:  Visual disturbance over the last 6 months. Vision is worsening. Some headaches.  EXAM: CT HEAD WITHOUT AND WITH CONTRAST  TECHNIQUE: Contiguous  axial images were obtained from the base of the skull through the vertex without and with intravenous contrast  CONTRAST:  102m OMNIPAQUE IOHEXOL 300 MG/ML  SOLN  COMPARISON:  01/28/2019  FINDINGS: Brain: Age related volume loss. No focal abnormality affects the brainstem or cerebellum. Cerebral hemispheres show minimal small vessel change of the deep white matter. No cortical or large vessel territory infarction. No mass lesion, hemorrhage, hydrocephalus or extra-axial collection. After contrast administration, no abnormal enhancement occurs.  Vascular: There is atherosclerotic calcification of the major vessels at the base of the brain.  Skull: Normal  Sinuses/Orbits: Clear sinuses. Apparent previous lens implants. No acute or significant orbital finding.  Other: None  IMPRESSION:  No acute or reversible finding. Age related volume loss. Minimal small vessel change of the deep white matter. Atherosclerotic calcification of the major vessels at the base of the brain. No specific abnormality seen to explain deterioration of vision.  Electronically Signed   By: Nelson Chimes M.D.   On: 12/29/2019 15:28  Assessment  The primary encounter diagnosis was Chronic pain syndrome. Diagnoses of Chronic lower extremity pain (Primary Area of Pain) (Left), Chronic low back pain (Secondary Area of Pain) (Bilateral) (ML) (L>R) with sciatica (Left), Foot drop (Left), Failed back surgical syndrome, Presence of neurostimulator, Disturbance of skin sensation, Pharmacologic therapy, Uncomplicated opioid dependence ([place]), Opioid Drug tolerance, Neurogenic pain, Long term prescription opiate use, and Opiate use were also pertinent to this visit.  Plan of Care  Problem-specific:  No problem-specific Assessment & Plan notes found for this encounter.  Mr. WON KREUZER has a current medication list which includes the following long-term medication(s): allopurinol, amlodipine, citalopram, fluticasone,  gabapentin, isosorbide mononitrate, levothyroxine, linaclotide, losartan, [START ON 01/21/2020] oxycodone hcl, testosterone, pantoprazole, and [DISCONTINUED] diphenhydramine.  Pharmacotherapy (Medications Ordered): Meds ordered this encounter  Medications  . gabapentin (NEURONTIN) 600 MG tablet    Sig: Take 1 tablet (600 mg total) by mouth 2 (two) times daily.    Dispense:  60 tablet    Refill:  2    Fill one day early if pharmacy is closed on scheduled refill date. Generic permitted. Do not send renewal requests. Void any older duplicate prescription or refill(s) that may be on file.  . Oxycodone HCl 10 MG TABS    Sig: Take 1 tablet (10 mg total) by mouth every 6 (six) hours as needed. Must last 30 days.    Dispense:  120 tablet    Refill:  0    Chronic Pain: STOP Act (Not applicable) Fill 1 day early if closed on refill date. Avoid benzodiazepines within 8 hours of opioids  . naloxone (NARCAN) 2 MG/2ML injection    Sig: Inject 1 mL (1 mg total) into the muscle as needed for up to 2 doses (for opioid overdose). In case of emergency (overdose), inject into muscle of upper arm or leg and call 911.    Dispense:  2 mL    Refill:  1    Please instruct patient on the emergency use of this medication.   Orders:  No orders of the defined types were placed in this encounter.  Follow-up plan:   Return in about 6 weeks (around 02/20/2020) for (F2F), (Med Mgmt) and spinal cord stimulator reprogramming.      Interventional treatment options:  Under consideration:   Diagnostic Caudal ESI + epidurogram #1 Possible Racz procedure Diagnostic bilateral lumbar facet block Possible bilateral lumbar facet RFA Possible candidate for intrathecal pump trial and implant   Therapeutic/palliative (PRN):   Therapeutic/palliative left L4TFESI#2 Therapeutic/palliative left L5TFESI#2 Bilateral spinal cord stimulator trial (done - 01/17/2018)  Permanent bilateral spinal cord stimulator implant by  Dr. Cari Caraway (neurosurgery) (done - 03/13/2018)    Recent Visits Date Type Provider Dept  11/13/19 Office Visit Milinda Pointer, [facility]  10/14/19 Office Visit Milinda Pointer, MD [facility]  Showing recent visits within past 90 days and meeting all other requirements Today's Visits Date Type Provider Dept  01/12/20 Telemedicine Milinda Pointer, MD [facility]  Showing today's visits and meeting all other requirements Future Appointments Date Type Provider Dept  02/19/20 Appointment Milinda Pointer, MD [facility]  Showing future appointments within next  90 days and meeting all other requirements  Mendez discussed the assessment and treatment plan with the patient. The patient was provided an opportunity to ask questions and all were answered. The patient agreed with the plan and demonstrated an understanding of the instructions.  Patient advised to call back or seek an in-person evaluation if the symptoms or condition worsens.  Duration of encounter: 25 minutes.  Note by: Gaspar Cola, MD Date: 01/12/2020; Time: 10:27 AM

## 2020-01-12 ENCOUNTER — Other Ambulatory Visit: Payer: Self-pay

## 2020-01-12 ENCOUNTER — Ambulatory Visit: Payer: Medicare HMO | Attending: Pain Medicine | Admitting: Pain Medicine

## 2020-01-12 ENCOUNTER — Encounter: Payer: Self-pay | Admitting: Pain Medicine

## 2020-01-12 DIAGNOSIS — M21372 Foot drop, left foot: Secondary | ICD-10-CM | POA: Diagnosis not present

## 2020-01-12 DIAGNOSIS — M792 Neuralgia and neuritis, unspecified: Secondary | ICD-10-CM

## 2020-01-12 DIAGNOSIS — M5442 Lumbago with sciatica, left side: Secondary | ICD-10-CM | POA: Diagnosis not present

## 2020-01-12 DIAGNOSIS — T50905S Adverse effect of unspecified drugs, medicaments and biological substances, sequela: Secondary | ICD-10-CM

## 2020-01-12 DIAGNOSIS — Z9682 Presence of neurostimulator: Secondary | ICD-10-CM

## 2020-01-12 DIAGNOSIS — Z79899 Other long term (current) drug therapy: Secondary | ICD-10-CM

## 2020-01-12 DIAGNOSIS — Z79891 Long term (current) use of opiate analgesic: Secondary | ICD-10-CM

## 2020-01-12 DIAGNOSIS — G894 Chronic pain syndrome: Secondary | ICD-10-CM | POA: Diagnosis not present

## 2020-01-12 DIAGNOSIS — M79605 Pain in left leg: Secondary | ICD-10-CM

## 2020-01-12 DIAGNOSIS — F1120 Opioid dependence, uncomplicated: Secondary | ICD-10-CM

## 2020-01-12 DIAGNOSIS — G8929 Other chronic pain: Secondary | ICD-10-CM

## 2020-01-12 DIAGNOSIS — M961 Postlaminectomy syndrome, not elsewhere classified: Secondary | ICD-10-CM

## 2020-01-12 DIAGNOSIS — R209 Unspecified disturbances of skin sensation: Secondary | ICD-10-CM

## 2020-01-12 DIAGNOSIS — F1190 Opioid use, unspecified, uncomplicated: Secondary | ICD-10-CM

## 2020-01-12 MED ORDER — NALOXONE HCL 2 MG/2ML IJ SOSY: 1.0000 mg | PREFILLED_SYRINGE | INTRAMUSCULAR | 1 refills | Status: DC | PRN

## 2020-01-12 MED ORDER — GABAPENTIN 600 MG PO TABS: 600.0000 mg | ORAL_TABLET | Freq: Two times a day (BID) | ORAL | 2 refills | Status: DC

## 2020-01-12 MED ORDER — OXYCODONE HCL 10 MG PO TABS: 10.0000 mg | ORAL_TABLET | Freq: Four times a day (QID) | ORAL | 0 refills | Status: DC | PRN

## 2020-01-12 NOTE — Patient Instructions (Signed)
____________________________________________________________________________________________  Drug Holidays (Slow)  What is a "Drug Holiday"? Drug Holiday: is the name given to the period of time during which a patient stops taking a medication(s) for the purpose of eliminating tolerance to the drug.  Benefits . Improved effectiveness of opioids. . Decreased opioid dose needed to achieve benefits. . Improved pain with lesser dose.  What is tolerance? Tolerance: is the progressive decreased in effectiveness of a drug due to its repetitive use. With repetitive use, the body gets use to the medication and as a consequence, it loses its effectiveness. This is a common problem seen with opioid pain medications. As a result, a larger dose of the drug is needed to achieve the same effect that used to be obtained with a smaller dose.  How long should a "Drug Holiday" last? You should stay off of the pain medicine for at least 14 consecutive days. (2 weeks)  Should I stop the medicine "cold turkey"? No. You should always coordinate with your Pain Specialist so that he/she can provide you with the correct medication dose to make the transition as smoothly as possible.  How do I stop the medicine? Slowly. You will be instructed to decrease the daily amount of pills that you take by one (1) pill every seven (7) days. This is called a "slow downward taper" of your dose. For example: if you normally take four (4) pills per day, you will be asked to drop this dose to three (3) pills per day for seven (7) days, then to two (2) pills per day for seven (7) days, then to one (1) per day for seven (7) days, and at the end of those last seven (7) days, this is when the "Drug Holiday" would start.   Will I have withdrawals? By doing a "slow downward taper" like this one, it is unlikely that you will experience any significant withdrawal symptoms. Typically, what triggers withdrawals is the sudden stop of a high  dose opioid therapy. Withdrawals can usually be avoided by slowly decreasing the dose over a prolonged period of time. If you do not follow these instructions and decide to stop your medication abruptly, withdrawals may be possible.  What are withdrawals? Withdrawals: refers to the wide range of symptoms that occur after stopping or dramatically reducing opiate drugs after heavy and prolonged use. Withdrawal symptoms do not occur to patients that use low dose opioids, or those who take the medication sporadically. Contrary to benzodiazepine (example: Valium, Xanax, etc.) or alcohol withdrawals ("Delirium Tremens"), opioid withdrawals are not lethal. Withdrawals are the physical manifestation of the body getting rid of the excess receptors.  Expected Symptoms Early symptoms of withdrawal may include: . Agitation . Anxiety . Muscle aches . Increased tearing . Insomnia . Runny nose . Sweating . Yawning  Late symptoms of withdrawal may include: . Abdominal cramping . Diarrhea . Dilated pupils . Goose bumps . Nausea . Vomiting  Will I experience withdrawals? Due to the slow nature of the taper, it is very unlikely that you will experience any.  What is a slow taper? Taper: refers to the gradual decrease in dose.  (Last update: 08/27/2019) ____________________________________________________________________________________________     

## 2020-01-12 NOTE — Addendum Note (Signed)
Addended by: Milinda Pointer A on: 01/12/2020 10:27 AM   Modules accepted: Orders

## 2020-02-10 DIAGNOSIS — H353131 Nonexudative age-related macular degeneration, bilateral, early dry stage: Secondary | ICD-10-CM | POA: Diagnosis not present

## 2020-02-18 ENCOUNTER — Ambulatory Visit: Payer: Medicare HMO | Admitting: Oncology

## 2020-02-18 ENCOUNTER — Other Ambulatory Visit: Payer: Medicare HMO

## 2020-02-19 ENCOUNTER — Other Ambulatory Visit: Payer: Self-pay

## 2020-02-19 ENCOUNTER — Ambulatory Visit: Payer: Worker's Compensation | Attending: Pain Medicine | Admitting: Pain Medicine

## 2020-02-19 VITALS — BP 126/56 | HR 76 | Temp 98.6°F | Resp 16 | Ht 72.0 in | Wt 184.0 lb

## 2020-02-19 DIAGNOSIS — G894 Chronic pain syndrome: Secondary | ICD-10-CM | POA: Diagnosis present

## 2020-02-19 DIAGNOSIS — Z79899 Other long term (current) drug therapy: Secondary | ICD-10-CM | POA: Diagnosis present

## 2020-02-19 DIAGNOSIS — G8929 Other chronic pain: Secondary | ICD-10-CM | POA: Diagnosis present

## 2020-02-19 DIAGNOSIS — Z9682 Presence of neurostimulator: Secondary | ICD-10-CM | POA: Insufficient documentation

## 2020-02-19 DIAGNOSIS — M21372 Foot drop, left foot: Secondary | ICD-10-CM | POA: Diagnosis not present

## 2020-02-19 DIAGNOSIS — F1120 Opioid dependence, uncomplicated: Secondary | ICD-10-CM | POA: Insufficient documentation

## 2020-02-19 DIAGNOSIS — M5442 Lumbago with sciatica, left side: Secondary | ICD-10-CM | POA: Insufficient documentation

## 2020-02-19 DIAGNOSIS — M961 Postlaminectomy syndrome, not elsewhere classified: Secondary | ICD-10-CM | POA: Diagnosis present

## 2020-02-19 DIAGNOSIS — M79605 Pain in left leg: Secondary | ICD-10-CM | POA: Diagnosis not present

## 2020-02-19 MED ORDER — OXYCODONE HCL 10 MG PO TABS: 10.0000 mg | ORAL_TABLET | Freq: Four times a day (QID) | ORAL | 0 refills | Status: DC | PRN

## 2020-02-19 NOTE — Progress Notes (Signed)
Nursing Pain Medication Assessment:  Safety precautions to be maintained throughout the outpatient stay will include: orient to surroundings, keep bed in low position, maintain call bell within reach at all times, provide assistance with transfer out of bed and ambulation.  Medication Inspection Compliance: Pill count conducted under aseptic conditions, in front of the patient. Neither the pills nor the bottle was removed from the patient's sight at any time. Once count was completed pills were immediately returned to the patient in their original bottle.  Medication: Oxycodone IR Pill/Patch Count: 3 of 120 pills remain Pill/Patch Appearance: Markings consistent with prescribed medication Bottle Appearance: Standard pharmacy container. Clearly labeled. Filled Date: 13 / 15 / 2021 Last Medication intake:  Today

## 2020-02-19 NOTE — Progress Notes (Signed)
PROVIDER NOTE: Information contained herein reflects review and annotations entered in association with encounter. Interpretation of such information and data should be left to medically-trained personnel. Information provided to patient can be located elsewhere in the medical record under "Patient Instructions". Document created using STT-dictation technology, any transcriptional errors that may result from process are unintentional.    Patient: Chase Mendez  Service Category: E/M  Provider: Gaspar Cola, MD  DOB: Dec 08, 1934  DOS: 02/19/2020  Specialty: Interventional Pain Management  MRN: [id number]  Setting: Ambulatory outpatient  PCP: Chase Marble, MD  Type: Established Patient    Referring Provider: Jodi Marble, MD  Location: Office  Delivery: Face-to-face     HPI  [name], a 86 y.o. year old male, is here today because of his Chronic pain syndrome [G89.4]. Chase Mendez primary complain today is Leg Pain (left) Last encounter: My last encounter with him was on 12/29/2019. Pertinent problems: Chase Mendez has Spinal accessory neuropathy; Lower limb pain, inferior (L5) (Foot Drop) (Left); Chronic low back pain (Secondary Area of Pain) (Bilateral) (ML) (L>R) with sciatica (Left); Chronic lower extremity pain (Primary Area of Pain) (Left); Failed back surgical syndrome; Foot drop (Left); Lumbosacral radiculopathy at L5 (Left); DDD (degenerative disc disease), lumbar; Chronic pain syndrome; Thoracic central spinal stenosis; Chronic musculoskeletal pain; Neurogenic pain; Presence of neurostimulator; Gout of left foot due to renal impairment; and Disturbance of skin sensation on their pertinent problem list. Pain Assessment: Severity of Chronic pain is reported as a 4 /10. Location: Leg Left/denies. Onset: More than a month ago. Quality: Sharp,Stabbing. Timing: Intermittent. Modifying factor(s): medications. Vitals:  height is 6' (1.829 m) and weight is 184 lb (83.5 kg). His  temperature is 98.6 F (37 C). His blood pressure is 126/56 (abnormal) and his pulse is 76. His respiration is 16 and oxygen saturation is 96%.   Reason for encounter: medication management.   The patient indicates doing well with the current medication regimen. No adverse reactions or side effects reported to the medications. PMP & UDS compliant. The patient successfully completed a drug holiday which allowed him to lower his oxycodone dose from 15 mg every 6 hours to 10 mg every 6. His daily MME has gone down from 90 mg/day to 60. His wife indicated that she is very proud of him on how he was able to handle the changes. She also indicated today that they have noticed that since they lowered the dose, his skin is no longer really sensitive to touch, suggesting that he was experiencing some opioid hyperalgesia at the higher dose. Long-term plan is to keep him on this medication until he again developed tolerance to it at which time we will go through another drug holiday and hopefully be able to lower it more.  The patient comes into the clinic today complaining about the stimulation being too strong. It turns out that although he had been properly trained on how to use his spinal cord stimulator in the past, he had forgotten some of the basics, such as the fact that he could lower the output. Today he has been provided with additional programming using high-frequency, subclinical stimulation, where he should be able to attain good relief of the pain without even feeling the stimulation from the device. There were extremely happy about this alternative. Today we had the assistance of the [employer] representative to accomplish this reprogramming of the device.  RTCB: 05/20/2020 Nonopioids transferred 01/12/2020: Gabapentin (Neurontin)  Pharmacotherapy Assessment   Analgesic:  Oxycodone IR 10 mg, 1 tab PO q 6 hrs. (40 mg/day of oxycodone) MME/day:70m/day.   Monitoring: [place] PMP: PDMP reviewed during this  encounter.       Pharmacotherapy: No side-effects or adverse reactions reported. Compliance: No problems identified. Effectiveness: Clinically acceptable.  TDewayne Shorter RN  02/19/2020 10:45 AM  Signed Nursing Pain Medication Assessment:  Safety precautions to be maintained throughout the outpatient stay will include: orient to surroundings, keep bed in low position, maintain call bell within reach at all times, provide assistance with transfer out of bed and ambulation.  Medication Inspection Compliance: Pill count conducted under aseptic conditions, in front of the patient. Neither the pills nor the bottle was removed from the patient's sight at any time. Once count was completed pills were immediately returned to the patient in their original bottle.  Medication: Oxycodone IR Pill/Patch Count: 3 of 120 pills remain Pill/Patch Appearance: Markings consistent with prescribed medication Bottle Appearance: Standard pharmacy container. Clearly labeled. Filled Date: 159/ 15 / 2021 Last Medication intake:  Today    UDS:  Summary  Date Value Ref Range Status  07/23/2019 Note  Final    Comment:    ==================================================================== [employer] [facility] 13 (MW) ==================================================================== Test                             Result       Flag       Units  Drug Present and Declared for Prescription Verification   Oxycodone                      >3861        EXPECTED   ng/mg creat   Oxymorphone                    2135         EXPECTED   ng/mg creat   Noroxycodone                   >3861        EXPECTED   ng/mg creat   Noroxymorphone                 1084         EXPECTED   ng/mg creat    Sources of oxycodone are scheduled prescription medications.    Oxymorphone, noroxycodone, and noroxymorphone are expected    metabolites of oxycodone. Oxymorphone is also available as a    scheduled prescription  medication.  ==================================================================== Test                      Result    Flag   Units      Ref Range   Creatinine              259              mg/dL      >=20 ==================================================================== Declared Medications:  The flagging and interpretation on this report are based on the  following declared medications.  Unexpected results may arise from  inaccuracies in the declared medications.   **Note: The testing scope of this panel includes these medications:   Oxycodone (Roxicodone)   **Note: The testing scope of this panel does not include the  following reported medications:   Allopurinol (Zyloprim)  Amlodipine (Norvasc)  Aspirin  Atorvastatin (Lipitor)  Citalopram (Celexa)  Cyanocobalamin  Fluticasone (Flonase)  Gabapentin (Neurontin)  Isosorbide (Imdur)  Levothyroxine (Synthroid)  Linaclotide (Linzess)  Losartan (Cozaar)  Mirabegron (Myrbetriq)  Naloxone (Narcan)  Ondansetron (Zofran)  Pantoprazole (Protonix)  Testosterone  Vitamin E ==================================================================== For clinical consultation, please call [phone]. ====================================================================      ROS  Constitutional: Denies any fever or chills Gastrointestinal: No reported hemesis, hematochezia, vomiting, or acute GI distress Musculoskeletal: Denies any acute onset joint swelling, redness, loss of ROM, or weakness Neurological: No reported episodes of acute onset apraxia, aphasia, dysarthria, agnosia, amnesia, paralysis, loss of coordination, or loss of consciousness  Medication Review  DULoxetine, Oxycodone HCl, Testosterone, allopurinol, amLODipine, aspirin, atorvastatin, benzonatate, diphenhydrAMINE, fluticasone, gabapentin, isosorbide mononitrate, levothyroxine, linaclotide, losartan, naloxone, ondansetron, pantoprazole, tamsulosin, vitamin B-12, and  vitamin E  History Review  Allergy: Chase Mendez is allergic to doxycycline, ibuprofen, and sulfa antibiotics. Drug: Chase Mendez  reports current drug use. Drug: Oxycodone. Alcohol:  reports no history of alcohol use. Tobacco:  reports that he has been smoking cigars. He has a 35.00 pack-year smoking history. He has never used smokeless tobacco. Social: Chase Mendez  reports that he has been smoking cigars. He has a 35.00 pack-year smoking history. He has never used smokeless tobacco. He reports current drug use. Drug: Oxycodone. He reports that he does not drink alcohol. Medical:  has a past medical history of Acute encephalopathy (12/08/2014), Anxiety, ARF (acute renal failure) ([place]) (12/08/2014), Back pain, Benign neoplasm of large bowel, Capsulitis, Chronic back pain, Coronavirus infection, Depression, Diabetes mellitus without complication ([place]), Dysphagia, Exostosis, Foot drop, left, Frequent falls (02/2018), GERD (gastroesophageal reflux disease), Gout, Hypertension, Hypothyroidism, Insomnia, Low testosterone, Microscopic hematuria (2016), Myocardial infarction ([place]), Pneumonia (12/07/2014), Pressure ulcer (12/09/2014), Sepsis ([place]) (12/08/2014), and Thyroid disease. Surgical: Chase Mendez  has a past surgical history that includes Cholecystectomy; Esophagogastroduodenoscopy (egd) with propofol (N/A, 04/24/2017); Back surgery (1995, 1996); Eye surgery (Bilateral, 1983, 1985); Appendectomy; and Spinal cord stimulator insertion (N/A, 03/13/2018). Family: family history includes Diabetes in his father; Heart disease in his mother; Kidney cancer in his brother.  Laboratory Chemistry Profile   Renal Lab Results  Component Value Date   BUN 17 01/28/2019   CREATININE 1.30 (H) 12/29/2019   GFRAA >60 01/28/2019   GFRNONAA >60 01/28/2019     Hepatic Lab Results  Component Value Date   AST 75 (H) 11/28/2018   ALT 20 11/28/2018   ALBUMIN 3.9 11/28/2018   ALKPHOS 134 (H) 11/28/2018   HCVAB <0.1 07/24/2018    LIPASE 37 08/21/2016     Electrolytes Lab Results  Component Value Date   NA 140 01/28/2019   K 4.0 01/28/2019   CL 105 01/28/2019   CALCIUM 8.9 01/28/2019     Bone No results found for: VD25OH, LY650PT4SFK, CL2751ZG0, FV4944HQ7, 25OHVITD1, 25OHVITD2, 25OHVITD3, TESTOFREE, TESTOSTERONE   Inflammation (CRP: Acute Phase) (ESR: Chronic Phase) Lab Results  Component Value Date   LATICACIDVEN 1.1 12/08/2014       Note: Above Lab results reviewed.  Recent Imaging Review  CT HEAD W & WO CONTRAST CLINICAL DATA:  Visual disturbance over the last 6 months. Vision is worsening. Some headaches.  EXAM: CT HEAD WITHOUT AND WITH CONTRAST  TECHNIQUE: Contiguous axial images were obtained from the base of the skull through the vertex without and with intravenous contrast  CONTRAST:  28m OMNIPAQUE IOHEXOL 300 MG/ML  SOLN  COMPARISON:  01/28/2019  FINDINGS: Brain: Age related volume loss. No focal abnormality affects the brainstem or cerebellum. Cerebral hemispheres show minimal small vessel change of the deep white matter.  No cortical or large vessel territory infarction. No mass lesion, hemorrhage, hydrocephalus or extra-axial collection. After contrast administration, no abnormal enhancement occurs.  Vascular: There is atherosclerotic calcification of the major vessels at the base of the brain.  Skull: Normal  Sinuses/Orbits: Clear sinuses. Apparent previous lens implants. No acute or significant orbital finding.  Other: None  IMPRESSION: No acute or reversible finding. Age related volume loss. Minimal small vessel change of the deep white matter. Atherosclerotic calcification of the major vessels at the base of the brain. No specific abnormality seen to explain deterioration of vision.  Electronically Signed   By: Nelson Chimes M.D.   On: 12/29/2019 15:28 Note: Reviewed        Physical Exam  General appearance: Well nourished, well developed, and well hydrated.  In no apparent acute distress Mental status: Alert, oriented x 3 (person, place, & time)       Respiratory: No evidence of acute respiratory distress Eyes: PERLA Vitals: BP (!) 126/56   Pulse 76   Temp 98.6 F (37 C)   Resp 16   Ht 6' (1.829 m)   Wt 184 lb (83.5 kg)   SpO2 96%   BMI 24.95 kg/m  BMI: Estimated body mass index is 24.95 kg/m as calculated from the following:   Height as of this encounter: 6' (1.829 m).   Weight as of this encounter: 184 lb (83.5 kg). Ideal: Ideal body weight: 77.6 kg (171 lb 1.2 oz) Adjusted ideal body weight: 79.9 kg (176 lb 3.9 oz)  Assessment   Status Diagnosis  Controlled Controlled Controlled 1. Chronic pain syndrome   2. Chronic lower extremity pain (Primary Area of Pain) (Left)   3. Chronic low back pain (Secondary Area of Pain) (Bilateral) (ML) (L>R) with sciatica (Left)   4. Foot drop (Left)   5. Failed back surgical syndrome   6. Presence of neurostimulator   7. Pharmacologic therapy   8. Uncomplicated opioid dependence ([place])      Updated Problems: No problems updated.  Plan of Care  Problem-specific:  No problem-specific Assessment & Plan notes found for this encounter.  Chase Mendez has a current medication list which includes the following long-term medication(s): allopurinol, amlodipine, fluticasone, gabapentin, isosorbide mononitrate, levothyroxine, linaclotide, losartan, [START ON 02/20/2020] oxycodone hcl, [START ON 03/21/2020] oxycodone hcl, [START ON 04/20/2020] oxycodone hcl, testosterone, pantoprazole, and [DISCONTINUED] diphenhydramine.  Pharmacotherapy (Medications Ordered): Meds ordered this encounter  Medications  . Oxycodone HCl 10 MG TABS    Sig: Take 1 tablet (10 mg total) by mouth every 6 (six) hours as needed. Must last 30 days.    Dispense:  120 tablet    Refill:  0    Chronic Pain: STOP Act (Not applicable) Fill 1 day early if closed on refill date. Avoid benzodiazepines within 8 hours of opioids  .  Oxycodone HCl 10 MG TABS    Sig: Take 1 tablet (10 mg total) by mouth every 6 (six) hours as needed. Must last 30 days.    Dispense:  120 tablet    Refill:  0    Chronic Pain: STOP Act (Not applicable) Fill 1 day early if closed on refill date. Avoid benzodiazepines within 8 hours of opioids  . Oxycodone HCl 10 MG TABS    Sig: Take 1 tablet (10 mg total) by mouth every 6 (six) hours as needed. Must last 30 days.    Dispense:  120 tablet    Refill:  0    Chronic Pain:  STOP Act (Not applicable) Fill 1 day early if closed on refill date. Avoid benzodiazepines within 8 hours of opioids   Orders:  Orders Placed This Encounter  Procedures  . Spinal Cord Stimulator Analysis   Follow-up plan:   Return in about 13 weeks (around 05/20/2020) for (F2F), (Med Mgmt).      Interventional treatment options:  Under consideration:   Diagnostic Caudal ESI + epidurogram #1 Possible Racz procedure Diagnostic bilateral lumbar facet block Possible bilateral lumbar facet RFA Possible candidate for intrathecal pump trial and implant   Therapeutic/palliative (PRN):   Therapeutic/palliative left L4TFESI#2 Therapeutic/palliative left L5TFESI#2 Bilateral spinal cord stimulator trial (done - 01/17/2018)  Permanent bilateral spinal cord stimulator implant by Dr. Cari Caraway (neurosurgery) (done - 03/13/2018)     Recent Visits Date Type Provider Dept  01/12/20 Telemedicine Milinda Pointer, MD [facility]  Showing recent visits within past 90 days and meeting all other requirements Today's Visits Date Type Provider Dept  02/19/20 Procedure visit Milinda Pointer, MD [facility]  Showing today's visits and meeting all other requirements Future Appointments Date Type Provider Dept  05/17/20 Appointment Milinda Pointer, MD [facility]  Showing future appointments within next 90 days and meeting all other requirements  I discussed the assessment and treatment  plan with the patient. The patient was provided an opportunity to ask questions and all were answered. The patient agreed with the plan and demonstrated an understanding of the instructions.  Patient advised to call back or seek an in-person evaluation if the symptoms or condition worsens.  Duration of encounter: 30 minutes.  Note by: Chase Cola, MD Date: 02/19/2020; Time: 11:37 AM

## 2020-02-19 NOTE — Patient Instructions (Signed)
____________________________________________________________________________________________  Medication Rules  Purpose: To inform patients, and their family members, of our rules and regulations.  Applies to: All patients receiving prescriptions (written or electronic).  Pharmacy of record: Pharmacy where electronic prescriptions will be sent. If written prescriptions are taken to a different pharmacy, please inform the nursing staff. The pharmacy listed in the electronic medical record should be the one where you would like electronic prescriptions to be sent.  Electronic prescriptions: In compliance with the  Strengthen Opioid Misuse Prevention (STOP) Act of 2017 (Session Law 2017-74/H243), effective February 06, 2018, all controlled substances must be electronically prescribed. Calling prescriptions to the pharmacy will cease to exist.  Prescription refills: Only during scheduled appointments. Applies to all prescriptions.  NOTE: The following applies primarily to controlled substances (Opioid* Pain Medications).   Type of encounter (visit): For patients receiving controlled substances, face-to-face visits are required. (Not an option or up to the patient.)  Patient's responsibilities: 1. Pain Pills: Bring all pain pills to every appointment (except for procedure appointments). 2. Pill Bottles: Bring pills in original pharmacy bottle. Always bring the newest bottle. Bring bottle, even if empty. 3. Medication refills: You are responsible for knowing and keeping track of what medications you take and those you need refilled. The day before your appointment: write a list of all prescriptions that need to be refilled. The day of the appointment: give the list to the admitting nurse. Prescriptions will be written only during appointments. No prescriptions will be written on procedure days. If you forget a medication: it will not be "Called in", "Faxed", or "electronically sent".  You will need to get another appointment to get these prescribed. No early refills. Do not call asking to have your prescription filled early. 4. Prescription Accuracy: You are responsible for carefully inspecting your prescriptions before leaving our office. Have the discharge nurse carefully go over each prescription with you, before taking them home. Make sure that your name is accurately spelled, that your address is correct. Check the name and dose of your medication to make sure it is accurate. Check the number of pills, and the written instructions to make sure they are clear and accurate. Make sure that you are given enough medication to last until your next medication refill appointment. 5. Taking Medication: Take medication as prescribed. When it comes to controlled substances, taking less pills or less frequently than prescribed is permitted and encouraged. Never take more pills than instructed. Never take medication more frequently than prescribed.  6. Inform other Doctors: Always inform, all of your healthcare providers, of all the medications you take. 7. Pain Medication from other Providers: You are not allowed to accept any additional pain medication from any other Doctor or Healthcare provider. There are two exceptions to this rule. (see below) In the event that you require additional pain medication, you are responsible for notifying us, as stated below. 8. Cough Medicine: Often these contain an opioid, such as codeine or hydrocodone. Never accept or take cough medicine containing these opioids if you are already taking an opioid* medication. The combination may cause respiratory failure and death. 9. Medication Agreement: You are responsible for carefully reading and following our Medication Agreement. This must be signed before receiving any prescriptions from our practice. Safely store a copy of your signed Agreement. Violations to the Agreement will result in no further prescriptions.  (Additional copies of our Medication Agreement are available upon request.) 10. Laws, Rules, & Regulations: All patients are expected to follow all   Federal and State Laws, Statutes, Rules, & Regulations. Ignorance of the Laws does not constitute a valid excuse.  11. Illegal drugs and Controlled Substances: The use of illegal substances (including, but not limited to marijuana and its derivatives) and/or the illegal use of any controlled substances is strictly prohibited. Violation of this rule may result in the immediate and permanent discontinuation of any and all prescriptions being written by our practice. The use of any illegal substances is prohibited. 12. Adopted CDC guidelines & recommendations: Target dosing levels will be at or below 60 MME/day. Use of benzodiazepines** is not recommended.  Exceptions: There are only two exceptions to the rule of not receiving pain medications from other Healthcare Providers. 1. Exception #1 (Emergencies): In the event of an emergency (i.e.: accident requiring emergency care), you are allowed to receive additional pain medication. However, you are responsible for: As soon as you are able, call our office [phone], at any time of the day or night, and leave a message stating your name, the date and nature of the emergency, and the name and dose of the medication prescribed. In the event that your call is answered by a member of our staff, make sure to document and save the date, time, and the name of the person that took your information.  2. Exception #2 (Planned Surgery): In the event that you are scheduled by another doctor or dentist to have any type of surgery or procedure, you are allowed (for a period no longer than 30 days), to receive additional pain medication, for the acute post-op pain. However, in this case, you are responsible for picking up a copy of our "Post-op Pain Management for Surgeons" handout, and giving it to your surgeon or dentist. This  document is available at our office, and does not require an appointment to obtain it. Simply go to our office during business hours (Monday-Thursday from 8:00 AM to 4:00 PM) (Friday 8:00 AM to 12:00 Noon) or if you have a scheduled appointment with us, prior to your surgery, and ask for it by name. In addition, you are responsible for: calling our office [phone], at any time of the day or night, and leaving a message stating your name, name of your surgeon, type of surgery, and date of procedure or surgery. Failure to comply with your responsibilities may result in termination of therapy involving the controlled substances.  *Opioid medications include: morphine, codeine, oxycodone, oxymorphone, hydrocodone, hydromorphone, meperidine, tramadol, tapentadol, buprenorphine, fentanyl, methadone. **Benzodiazepine medications include: diazepam (Valium), alprazolam (Xanax), clonazepam (Klonopine), lorazepam (Ativan), clorazepate (Tranxene), chlordiazepoxide (Librium), estazolam (Prosom), oxazepam (Serax), temazepam (Restoril), triazolam (Halcion) (Last updated: 01/05/2020) ____________________________________________________________________________________________   ____________________________________________________________________________________________  Medication Recommendations and Reminders  Applies to: All patients receiving prescriptions (written and/or electronic).  Medication Rules & Regulations: These rules and regulations exist for your safety and that of others. They are not flexible and neither are we. Dismissing or ignoring them will be considered "non-compliance" with medication therapy, resulting in complete and irreversible termination of such therapy. (See document titled "Medication Rules" for more details.) In all conscience, because of safety reasons, we cannot continue providing a therapy where the patient does not follow instructions.  Pharmacy of record:   Definition:  This is the pharmacy where your electronic prescriptions will be sent.   We do not endorse any particular pharmacy, however, we have experienced problems with [facility] not securing enough medication supply for the community.  We do not restrict you in your choice of pharmacy. However,   once we write for your prescriptions, we will NOT be re-sending more prescriptions to fix restricted supply problems created by your pharmacy, or your insurance.   The pharmacy listed in the electronic medical record should be the one where you want electronic prescriptions to be sent.  If you choose to change pharmacy, simply notify our nursing staff.  Recommendations:  Keep all of your pain medications in a safe place, under lock and key, even if you live alone. We will NOT replace lost, stolen, or damaged medication.  After you fill your prescription, take 1 week's worth of pills and put them away in a safe place. You should keep a separate, properly labeled bottle for this purpose. The remainder should be kept in the original bottle. Use this as your primary supply, until it runs out. Once it's gone, then you know that you have 1 week's worth of medicine, and it is time to come in for a prescription refill. If you do this correctly, it is unlikely that you will ever run out of medicine.  To make sure that the above recommendation works, it is very important that you make sure your medication refill appointments are scheduled at least 1 week before you run out of medicine. To do this in an effective manner, make sure that you do not leave the office without scheduling your next medication management appointment. Always ask the nursing staff to show you in your prescription , when your medication will be running out. Then arrange for the receptionist to get you a return appointment, at least 7 days before you run out of medicine. Do not wait until you have 1 or 2 pills left, to come in. This is very poor planning and  does not take into consideration that we may need to cancel appointments due to bad weather, sickness, or emergencies affecting our staff.  DO NOT ACCEPT A "Partial Fill": If for any reason your pharmacy does not have enough pills/tablets to completely fill or refill your prescription, do not allow for a "partial fill". The law allows the pharmacy to complete that prescription within 72 hours, without requiring a new prescription. If they do not fill the rest of your prescription within those 72 hours, you will need a separate prescription to fill the remaining amount, which we will NOT provide. If the reason for the partial fill is your insurance, you will need to talk to the pharmacist about payment alternatives for the remaining tablets, but again, DO NOT ACCEPT A PARTIAL FILL, unless you can trust your pharmacist to obtain the remainder of the pills within 72 hours.  Prescription refills and/or changes in medication(s):   Prescription refills, and/or changes in dose or medication, will be conducted only during scheduled medication management appointments. (Applies to both, written and electronic prescriptions.)  No refills on procedure days. No medication will be changed or started on procedure days. No changes, adjustments, and/or refills will be conducted on a procedure day. Doing so will interfere with the diagnostic portion of the procedure.  No phone refills. No medications will be "called into the pharmacy".  No Fax refills.  No weekend refills.  No Holliday refills.  No after hours refills.  Remember:  Business hours are:  Monday to Thursday 8:00 AM to 4:00 PM Provider's Schedule: Radley Barto, MD - Appointments are:  Medication management: Monday and Wednesday 8:00 AM to 4:00 PM Procedure day: Tuesday and Thursday 7:30 AM to 4:00 PM Bilal Lateef, MD - Appointments are:    Medication management: Tuesday and Thursday 8:00 AM to 4:00 PM Procedure day: Monday and Wednesday  7:30 AM to 4:00 PM (Last update: 08/27/2019) ____________________________________________________________________________________________   ____________________________________________________________________________________________  CBD (cannabidiol) WARNING  Applicable to: All individuals currently taking or considering taking CBD (cannabidiol) and, more important, all patients taking opioid analgesic controlled substances (pain medication). (Example: oxycodone; oxymorphone; hydrocodone; hydromorphone; morphine; methadone; tramadol; tapentadol; fentanyl; buprenorphine; butorphanol; dextromethorphan; meperidine; codeine; etc.)  Legal status: CBD remains a Schedule I drug prohibited for any use. CBD is illegal with one exception. In the [place], CBD has a limited Food and Drug Administration (FDA) approval for the treatment of two specific types of epilepsy disorders. Only one CBD product has been approved by the FDA for this purpose: "Epidiolex". FDA is aware that some companies are marketing products containing cannabis and cannabis-derived compounds in ways that violate the Federal Food, Drug and Cosmetic Act (FD&C Act) and that may put the health and safety of consumers at risk. The FDA, a Federal agency, has not enforced the CBD status since 2018.   Legality: Some manufacturers ship CBD products nationally, which is illegal. Often such products are sold online and are therefore available throughout the country. CBD is openly sold in head shops and health food stores in some states where such sales have not been explicitly legalized. Selling unapproved products with unsubstantiated therapeutic claims is not only a violation of the law, but also can put patients at risk, as these products have not been proven to be safe or effective. Federal illegality makes it difficult to conduct research on CBD.  Reference: "FDA Regulation of Cannabis and Cannabis-Derived Products, Including Cannabidiol  (CBD)" - https://www.fda.gov/news-events/public-health-focus/fda-regulation-cannabis-and-cannabis-derived-products-including-cannabidiol-cbd  Warning: CBD is not FDA approved and has not undergo the same manufacturing controls as prescription drugs.  This means that the purity and safety of available CBD may be questionable. Most of the time, despite manufacturer's claims, it is contaminated with THC (delta-9-tetrahydrocannabinol - the chemical in marijuana responsible for the "HIGH").  When this is the case, the THC contaminant will trigger a positive urine drug screen (UDS) test for Marijuana (carboxy-THC). Because a positive UDS for any illicit substance is a violation of our medication agreement, your opioid analgesics (pain medicine) may be permanently discontinued.  MORE ABOUT CBD  General Information: CBD  is a derivative of the Marijuana (cannabis sativa) plant discovered in 1940. It is one of the 113 identified substances found in Marijuana. It accounts for up to 40% of the plant's extract. As of 2018, preliminary clinical studies on CBD included research for the treatment of anxiety, movement disorders, and pain. CBD is available and consumed in multiple forms, including inhalation of smoke or vapor, as an aerosol spray, and by mouth. It may be supplied as an oil containing CBD, capsules, dried cannabis, or as a liquid solution. CBD is thought not to be as psychoactive as THC (delta-9-tetrahydrocannabinol - the chemical in marijuana responsible for the "HIGH"). Studies suggest that CBD may interact with different biological target receptors in the body, including cannabinoid and other neurotransmitter receptors. As of 2018 the mechanism of action for its biological effects has not been determined.  Side-effects  Adverse reactions: Dry mouth, diarrhea, decreased appetite, fatigue, drowsiness, malaise, weakness, sleep disturbances, and others.  Drug interactions: CBC may interact with other  medications such as blood-thinners. (Last update: 09/13/2019) ____________________________________________________________________________________________    

## 2020-03-10 ENCOUNTER — Other Ambulatory Visit: Payer: Self-pay

## 2020-03-10 ENCOUNTER — Encounter: Payer: Self-pay | Admitting: Emergency Medicine

## 2020-03-10 ENCOUNTER — Emergency Department
Admission: EM | Admit: 2020-03-10 | Discharge: 2020-03-10 | Disposition: A | Discharge status: Home / Self Care | Payer: Medicare HMO | Attending: Emergency Medicine | Admitting: Emergency Medicine

## 2020-03-10 ENCOUNTER — Emergency Department: Payer: Medicare HMO

## 2020-03-10 DIAGNOSIS — W228XXA Striking against or struck by other objects, initial encounter: Secondary | ICD-10-CM | POA: Insufficient documentation

## 2020-03-10 DIAGNOSIS — Z7982 Long term (current) use of aspirin: Secondary | ICD-10-CM | POA: Insufficient documentation

## 2020-03-10 DIAGNOSIS — F17290 Nicotine dependence, other tobacco product, uncomplicated: Secondary | ICD-10-CM | POA: Diagnosis not present

## 2020-03-10 DIAGNOSIS — E039 Hypothyroidism, unspecified: Secondary | ICD-10-CM | POA: Insufficient documentation

## 2020-03-10 DIAGNOSIS — Z79899 Other long term (current) drug therapy: Secondary | ICD-10-CM | POA: Insufficient documentation

## 2020-03-10 DIAGNOSIS — S92532A Displaced fracture of distal phalanx of left lesser toe(s), initial encounter for closed fracture: Secondary | ICD-10-CM | POA: Diagnosis not present

## 2020-03-10 DIAGNOSIS — S91112A Laceration without foreign body of left great toe without damage to nail, initial encounter: Secondary | ICD-10-CM | POA: Insufficient documentation

## 2020-03-10 DIAGNOSIS — S99922A Unspecified injury of left foot, initial encounter: Secondary | ICD-10-CM | POA: Diagnosis present

## 2020-03-10 DIAGNOSIS — S92535A Nondisplaced fracture of distal phalanx of left lesser toe(s), initial encounter for closed fracture: Secondary | ICD-10-CM | POA: Diagnosis not present

## 2020-03-10 DIAGNOSIS — Z85038 Personal history of other malignant neoplasm of large intestine: Secondary | ICD-10-CM | POA: Diagnosis not present

## 2020-03-10 DIAGNOSIS — S91115A Laceration without foreign body of left lesser toe(s) without damage to nail, initial encounter: Secondary | ICD-10-CM | POA: Diagnosis not present

## 2020-03-10 DIAGNOSIS — I10 Essential (primary) hypertension: Secondary | ICD-10-CM | POA: Diagnosis not present

## 2020-03-10 DIAGNOSIS — M7989 Other specified soft tissue disorders: Secondary | ICD-10-CM | POA: Diagnosis not present

## 2020-03-10 DIAGNOSIS — S91119A Laceration without foreign body of unspecified toe without damage to nail, initial encounter: Secondary | ICD-10-CM

## 2020-03-10 DIAGNOSIS — E119 Type 2 diabetes mellitus without complications: Secondary | ICD-10-CM | POA: Diagnosis not present

## 2020-03-10 MED ORDER — LIDOCAINE HCL (PF) 1 % IJ SOLN
5.0000 mL | Freq: Once | INTRAMUSCULAR | Status: AC
  Administered 2020-03-10: 5 mL
  Filled 2020-03-10: qty 5

## 2020-03-10 MED ORDER — BACITRACIN-NEOMYCIN-POLYMYXIN 400-5-5000 EX OINT: TOPICAL_OINTMENT | Freq: Once | CUTANEOUS | Status: AC

## 2020-03-10 NOTE — Discharge Instructions (Addendum)
Follow discharge care instructions and have sutures removed in 10 days.  Advised to buddy tape toe for 3 to 5 days.  Continue previous medications.

## 2020-03-10 NOTE — ED Provider Notes (Signed)
[facility] Emergency Department Provider Note   ____________________________________________   Event Date/Time   First MD Initiated Contact with Patient 03/10/20 [id number]     (approximate)  I have reviewed the triage vital signs and the nursing notes.   HISTORY  Chief Complaint Laceration    HPI Chase Mendez is a 86 y.o. male patient presents with pain/ laceration to the dorsal aspect of the left second toe.  Patient states stubbed toe on loose carpet.  Patient states this is a recurring incident of stubbing his toe secondary to foot drop.  Bleeding controlled with direct pressure.  Patient denies loss of sensation.         Past Medical History:  Diagnosis Date  . Acute encephalopathy 12/08/2014  . Anxiety   . ARF (acute renal failure) ([place]) 12/08/2014  . Back pain   . Benign neoplasm of large bowel   . Capsulitis    fractured displaced metatarsal with capsulitis  . Chronic back pain   . Coronavirus infection   . Depression   . Diabetes mellitus without complication (HCC)    no medications currently  . Dysphagia   . Exostosis    painful, right hallux  . Foot drop, left    wears a brace  . Frequent falls 02/2018   poor balance, foot drop  . GERD (gastroesophageal reflux disease)   . Gout   . Hypertension   . Hypothyroidism   . Insomnia   . Low testosterone   . Microscopic hematuria 2016  . Myocardial infarction [facility])    patient unaware when it happened years ago.    . Pneumonia 12/07/2014  . Pressure ulcer 12/09/2014  . Sepsis ([place]) 12/08/2014  . Thyroid disease     Patient Active Problem List   Diagnosis Date Noted  . Disturbance of skin sensation 01/12/2020  . Long term prescription opiate use 01/12/2020  . Opiate use 01/12/2020  . Uncomplicated opioid dependence ([place]) 10/19/2019  . Delirium 01/27/2019  . Gout of left foot due to renal impairment 12/16/2018  . Presence of neurostimulator 10/22/2018  . Encounter for  interrogation of neurostimulator 10/22/2018  . Chronic musculoskeletal pain 09/11/2018  . Neurogenic pain 09/11/2018  . Claustrophobia 01/23/2018  . Thoracic central spinal stenosis 01/22/2018  . Cellulitis of right hand 12/06/2017  . Chronic pain syndrome 07/11/2017  . DDD (degenerative disc disease), lumbar 06/19/2017  . Opioid Drug tolerance 06/10/2017  . History of acute renal failure (12/08/2014) 06/10/2017  . History of encephalopathy (12/08/2014) 06/10/2017  . History of sepsis (12/08/2014) 06/10/2017  . Lower limb pain, inferior (L5) (Foot Drop) (Left) 05/16/2017  . Chronic low back pain (Secondary Area of Pain) (Bilateral) (ML) (L>R) with sciatica (Left) 05/16/2017  . Chronic lower extremity pain (Primary Area of Pain) (Left) 05/16/2017  . 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 Foot drop (Left) 05/16/2017  . Lumbosacral radiculopathy at L5 (Left) 05/16/2017  . Disorder of skeletal system 05/16/2017  . Pharmacologic therapy 05/16/2017  . Problems influencing health status 05/16/2017  . Venous reflux 07/12/2016  . 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 Varicose veins of both lower extremities with pain 05/09/2016  . ARF (acute renal failure) ([place]) 12/08/2014  . Pneumonia 12/07/2014    Past Surgical History:  Procedure Laterality Date  . APPENDECTOMY    . [place]   x 2  . CHOLECYSTECTOMY    . ESOPHAGOGASTRODUODENOSCOPY (EGD) WITH PROPOFOL N/A 04/24/2017   Procedure: ESOPHAGOGASTRODUODENOSCOPY (EGD) WITH PROPOFOL;  Surgeon: Manya Silvas, MD;  Location: [facility] ENDOSCOPY;  Service: Endoscopy;  Laterality: N/A;  . EYE SURGERY Bilateral 1983, 1985   cataract extractions  . SPINAL CORD STIMULATOR INSERTION N/A 03/13/2018   Procedure: SPINAL CORD STIMULATOR INSERTION;  Surgeon: Meade Maw, MD;  Location: [facility] ORS;  Service: Neurosurgery;  Laterality: N/A;    Prior to Admission medications   Medication Sig Start Date End Date Taking? Authorizing  Provider  allopurinol (ZYLOPRIM) 100 MG tablet Take 100 mg by mouth daily. 10/26/14   [provider]  amLODipine (NORVASC) 10 MG tablet Take 10 mg by mouth daily.    [provider]  aspirin 81 MG tablet Take 81 mg by mouth daily.    [provider]  atorvastatin (LIPITOR) 20 MG tablet Take 20 mg by mouth daily.     [provider]  benzonatate (TESSALON) 100 MG capsule Take by mouth 3 (three) times daily as needed for cough.    [provider]  DULoxetine (CYMBALTA) 30 MG capsule Take 30 mg by mouth daily. 02/11/20   [provider]  fluticasone (FLONASE) 50 MCG/ACT nasal spray Place 1 spray into both nostrils daily as needed for rhinitis.     [provider]  gabapentin (NEURONTIN) 600 MG tablet Take 1 tablet (600 mg total) by mouth 2 (two) times daily. 01/12/20 04/11/20  Milinda Pointer, MD  isosorbide mononitrate (IMDUR) 30 MG 24 hr tablet Take 30 mg by mouth daily.    [provider]  levothyroxine (SYNTHROID, LEVOTHROID) 125 MCG tablet Take 150 mcg by mouth daily before breakfast.     [provider]  linaclotide (LINZESS) 290 MCG CAPS capsule Take 290 mcg by mouth daily as needed (for constipation).     [provider]  losartan (COZAAR) 100 MG tablet Take 100 mg by mouth daily.     [provider]  naloxone [facility]) 2 MG/2ML injection Inject 1 mL (1 mg total) into the muscle as needed for up to 2 doses (for opioid overdose). In case of emergency (overdose), inject into muscle of upper arm or leg and call 911. 01/12/20 01/11/21  Milinda Pointer, MD  ondansetron (ZOFRAN ODT) 4 MG disintegrating tablet Take 1 tablet (4 mg total) by mouth every 8 (eight) hours as needed for nausea or vomiting. 08/21/16   Darel Hong, MD  Oxycodone HCl 10 MG TABS Take 1 tablet (10 mg total) by mouth every 6 (six) hours as needed. Must last 30 days. 02/20/20 03/21/20  Milinda Pointer, MD  Oxycodone HCl 10 MG TABS  Take 1 tablet (10 mg total) by mouth every 6 (six) hours as needed. Must last 30 days. 03/21/20 04/20/20  Milinda Pointer, MD  Oxycodone HCl 10 MG TABS Take 1 tablet (10 mg total) by mouth every 6 (six) hours as needed. Must last 30 days. 04/20/20 05/20/20  Milinda Pointer, MD  pantoprazole (PROTONIX) 40 MG tablet Take 40 mg by mouth daily.  02/04/18 02/17/19  [provider]  tamsulosin (FLOMAX) 0.4 MG CAPS capsule Take 0.4 mg by mouth daily. 10/01/19   [provider]  Testosterone 40.5 MG/2.5GM (1.62%) GEL Place 2 Pump onto the skin 3 (three) times a week.    [provider]  vitamin B-12 (CYANOCOBALAMIN) 1000 MCG tablet Take 1,000 mcg by mouth daily.    [provider]  vitamin E 400 UNIT capsule Take 400 Units by mouth daily.    [provider]  diphenhydrAMINE (BENADRYL) 25 MG tablet Take 25 mg  by mouth 2 (two) times daily.   01/27/19  [provider]    Allergies Doxycycline, Ibuprofen, and Sulfa antibiotics  Family History  Problem Relation Age of Onset  . Heart disease Mother   . Diabetes Father   . Kidney cancer Brother     Social History Social History   Tobacco Use  . Smoking status: Current Every Day Smoker    Packs/day: 0.50    Years: 70.00    Pack years: 35.00    Types: Cigars  . Smokeless tobacco: Never Used  . Tobacco comment: unable to give cessation materials due to webex visit   Vaping Use  . Vaping Use: Never used  Substance Use Topics  . Alcohol use: No  . Drug use: Yes    Types: Oxycodone    Review of Systems Constitutional: No fever/chills Eyes: No visual changes. ENT: No sore throat. Cardiovascular: Denies chest pain. Respiratory: Denies shortness of breath. Gastrointestinal: No abdominal pain.  No nausea, no vomiting.  No diarrhea.  No constipation. Genitourinary: Negative for dysuria. Musculoskeletal: Negative for back pain. Skin: Negative for rash. Neurological: Left foot drop.    Psychiatric:  Anxiety, depression, insomnia, and opiate dependency. Endocrine:Gout , hyperlipidemia, hypertension, hypothyroidism.   Allergic/Immunilogical: Doxycycline, ibuprofen, and sulfa antibiotics.  ____________________________________________   PHYSICAL EXAM:  VITAL SIGNS: ED Triage Vitals [03/10/20 0639]  Enc Vitals Group     BP 106/86     Pulse Rate 78     Resp 18     Temp 98.6 F (37 C)     Temp Source Oral     SpO2 96 %     Weight 188 lb (85.3 kg)     Height 6' (1.829 m)     Head Circumference      Peak Flow      Pain Score 0     Pain Loc      Pain Edu?      Excl. in GC?    Constitutional: Alert and oriented. Well appearing and in no acute distress. Cardiovascular: Normal rate, regular rhythm. Grossly normal heart sounds.  Good peripheral circulation. Respiratory: Normal respiratory effort.  No retractions. Lungs CTAB. Musculoskeletal: No lower extremity tenderness nor edema.  No joint effusions. Neurologic:  Normal speech and language. No gross focal neurologic deficits are appreciated. No gait instability. Skin: 0.5 cm laceration dorsal aspect of left second toe.   Psychiatric: Mood and affect are normal. Speech and behavior are normal.  ____________________________________________   LABS (all labs ordered are listed, but only abnormal results are displayed)  Labs Reviewed - No data to display ____________________________________________  EKG   ____________________________________________  RADIOLOGY I, Joni Reining, personally viewed and evaluated these images (plain radiographs) as part of my medical decision making, as well as reviewing the written report by the radiologist.  ED MD interpretation: No acute findings.  Questionable swab small fracture fragment base of the distal phalanx.   Official radiology report(s): DG Toe 2nd Left  Result Date: 03/10/2020 CLINICAL DATA:  Second digit laceration.  Pain. EXAM: LEFT SECOND TOE COMPARISON:  No  prior. FINDINGS: Soft tissue swelling. No radiopaque foreign body. Small fracture fragment the base of the distal phalanx of the left second digit noted. IMPRESSION: 1. Soft tissue swelling. No radiopaque foreign body. 2. Small fracture fragment noted at the base of the distal phalanx of the left second digit. Electronically Signed   By: Maisie Fus  Register   On: 03/10/2020 08:25    ____________________________________________  PROCEDURES  Procedure(s) performed (including Critical Care):  Marland KitchenMarland KitchenLaceration Repair  Date/Time: 03/10/2020 8:23 AM Performed by: Sable Feil, PA-C Authorized by: Sable Feil, PA-C   Consent:    Consent obtained:  Verbal   Consent given by:  Patient   Risks, benefits, and alternatives were discussed: yes     Risks discussed:  Infection, poor cosmetic result, need for additional repair, poor wound healing and nerve damage Universal protocol:    Procedure explained and questions answered to patient or proxy's satisfaction: yes     Relevant documents present and verified: yes     Imaging studies available: yes     Required blood products, implants, devices, and special equipment available: no     Site/side marked: no     Immediately prior to procedure, a time out was called: yes     Patient identity confirmed:  Verbally with patient Anesthesia:    Anesthesia method:  Nerve block   Block needle gauge:  25 G   Block anesthetic:  Lidocaine 1% w/o epi   Block injection procedure:  Anatomic landmarks identified, incremental injection and anatomic landmarks palpated   Block outcome:  Anesthesia achieved Laceration details:    Location:  Toe   Toe location:  L second toe   Length (cm):  0.5   Depth (mm):  2 Exploration:    Limited defect created (wound extended): no   Treatment:    Area cleansed with:  Povidone-iodine and saline   Amount of cleaning:  Standard   Irrigation method:  Pressure wash and syringe   Debridement:  None   Undermining:  None    Scar revision: no   Skin repair:    Repair method:  Sutures   Suture size:  4-0   Suture material:  Nylon   Suture technique:  Simple interrupted   Number of sutures:  5 Approximation:    Approximation:  Close Repair type:    Repair type:  Simple Post-procedure details:    Dressing:  Antibiotic ointment, sterile dressing and splint for protection   Procedure completion:  Tolerated well, no immediate complications     ____________________________________________   INITIAL IMPRESSION / ASSESSMENT AND PLAN / ED COURSE  As part of my medical decision making, I reviewed the following data within the electronic MEDICAL RECORD NUMBER         Patient presents a laceration to the dorsal aspect of the second toe left foot.  See procedure note for wound closure.      ____________________________________________   FINAL CLINICAL IMPRESSION(S) / ED DIAGNOSES  Final diagnoses:  Laceration of toe of left foot without foreign body present or damage to nail, unspecified toe, initial encounter  Closed nondisplaced fracture of distal phalanx of lesser toe of left foot, initial encounter     ED Discharge Orders    None      *Please note:  Chase Mendez was evaluated in Emergency Department on 03/10/2020 for the symptoms described in the history of present illness. He was evaluated in the context of the global COVID-19 pandemic, which necessitated consideration that the patient might be at risk for infection with the SARS-CoV-2 virus that causes COVID-19. Institutional protocols and algorithms that pertain to the evaluation of patients at risk for COVID-19 are in a state of rapid change based on information released by regulatory bodies including the CDC and federal and state organizations. These policies and algorithms were followed during the patient's care in the ED.  Some ED  evaluations and interventions may be delayed as a result of limited staffing during and the pandemic.*   Note:   This document was prepared using Dragon voice recognition software and may include unintentional dictation errors.    Sable Feil, PA-C 03/10/20 [id number]    Blake Divine, MD 03/11/20 [id number]

## 2020-03-10 NOTE — ED Triage Notes (Signed)
Pt to triage via w/c with no distress noted; pt reports getting left 2nd toe "caught in the carpet"; laceration noted with no active bleeding; denies pain

## 2020-03-10 NOTE — ED Notes (Signed)
See triage note  Presents with laceration to left 2nd toe  States he caught his toe on carpet

## 2020-03-12 DIAGNOSIS — G6289 Other specified polyneuropathies: Secondary | ICD-10-CM | POA: Diagnosis not present

## 2020-03-12 DIAGNOSIS — B351 Tinea unguium: Secondary | ICD-10-CM | POA: Diagnosis not present

## 2020-03-12 DIAGNOSIS — S92535A Nondisplaced fracture of distal phalanx of left lesser toe(s), initial encounter for closed fracture: Secondary | ICD-10-CM | POA: Diagnosis not present

## 2020-03-12 DIAGNOSIS — L600 Ingrowing nail: Secondary | ICD-10-CM | POA: Diagnosis not present

## 2020-03-15 DIAGNOSIS — R9431 Abnormal electrocardiogram [ECG] [EKG]: Secondary | ICD-10-CM | POA: Diagnosis not present

## 2020-03-15 DIAGNOSIS — I2510 Atherosclerotic heart disease of native coronary artery without angina pectoris: Secondary | ICD-10-CM | POA: Diagnosis not present

## 2020-03-16 DIAGNOSIS — Z961 Presence of intraocular lens: Secondary | ICD-10-CM | POA: Diagnosis not present

## 2020-03-19 DIAGNOSIS — I2510 Atherosclerotic heart disease of native coronary artery without angina pectoris: Secondary | ICD-10-CM | POA: Diagnosis not present

## 2020-03-19 DIAGNOSIS — R0602 Shortness of breath: Secondary | ICD-10-CM | POA: Diagnosis not present

## 2020-03-19 DIAGNOSIS — E785 Hyperlipidemia, unspecified: Secondary | ICD-10-CM | POA: Diagnosis not present

## 2020-03-19 DIAGNOSIS — I10 Essential (primary) hypertension: Secondary | ICD-10-CM | POA: Diagnosis not present

## 2020-03-22 DIAGNOSIS — M21372 Foot drop, left foot: Secondary | ICD-10-CM | POA: Diagnosis not present

## 2020-03-22 DIAGNOSIS — G6289 Other specified polyneuropathies: Secondary | ICD-10-CM | POA: Diagnosis not present

## 2020-03-22 DIAGNOSIS — S92535D Nondisplaced fracture of distal phalanx of left lesser toe(s), subsequent encounter for fracture with routine healing: Secondary | ICD-10-CM | POA: Diagnosis not present

## 2020-04-06 DIAGNOSIS — Z85828 Personal history of other malignant neoplasm of skin: Secondary | ICD-10-CM | POA: Diagnosis not present

## 2020-04-06 DIAGNOSIS — X32XXXA Exposure to sunlight, initial encounter: Secondary | ICD-10-CM | POA: Diagnosis not present

## 2020-04-06 DIAGNOSIS — D2262 Melanocytic nevi of left upper limb, including shoulder: Secondary | ICD-10-CM | POA: Diagnosis not present

## 2020-04-06 DIAGNOSIS — D2261 Melanocytic nevi of right upper limb, including shoulder: Secondary | ICD-10-CM | POA: Diagnosis not present

## 2020-04-06 DIAGNOSIS — D2271 Melanocytic nevi of right lower limb, including hip: Secondary | ICD-10-CM | POA: Diagnosis not present

## 2020-04-06 DIAGNOSIS — L570 Actinic keratosis: Secondary | ICD-10-CM | POA: Diagnosis not present

## 2020-04-06 DIAGNOSIS — L538 Other specified erythematous conditions: Secondary | ICD-10-CM | POA: Diagnosis not present

## 2020-04-06 DIAGNOSIS — L820 Inflamed seborrheic keratosis: Secondary | ICD-10-CM | POA: Diagnosis not present

## 2020-04-06 DIAGNOSIS — D225 Melanocytic nevi of trunk: Secondary | ICD-10-CM | POA: Diagnosis not present

## 2020-04-12 DIAGNOSIS — M109 Gout, unspecified: Secondary | ICD-10-CM | POA: Diagnosis not present

## 2020-04-12 DIAGNOSIS — E039 Hypothyroidism, unspecified: Secondary | ICD-10-CM | POA: Diagnosis not present

## 2020-04-12 DIAGNOSIS — I2510 Atherosclerotic heart disease of native coronary artery without angina pectoris: Secondary | ICD-10-CM | POA: Diagnosis not present

## 2020-04-12 DIAGNOSIS — E785 Hyperlipidemia, unspecified: Secondary | ICD-10-CM | POA: Diagnosis not present

## 2020-04-14 DIAGNOSIS — E785 Hyperlipidemia, unspecified: Secondary | ICD-10-CM | POA: Diagnosis not present

## 2020-04-14 DIAGNOSIS — J209 Acute bronchitis, unspecified: Secondary | ICD-10-CM | POA: Diagnosis not present

## 2020-04-14 DIAGNOSIS — M109 Gout, unspecified: Secondary | ICD-10-CM | POA: Diagnosis not present

## 2020-04-14 DIAGNOSIS — R0602 Shortness of breath: Secondary | ICD-10-CM | POA: Diagnosis not present

## 2020-04-14 DIAGNOSIS — K219 Gastro-esophageal reflux disease without esophagitis: Secondary | ICD-10-CM | POA: Diagnosis not present

## 2020-04-14 DIAGNOSIS — E039 Hypothyroidism, unspecified: Secondary | ICD-10-CM | POA: Diagnosis not present

## 2020-04-14 DIAGNOSIS — I10 Essential (primary) hypertension: Secondary | ICD-10-CM | POA: Diagnosis not present

## 2020-04-14 DIAGNOSIS — I2510 Atherosclerotic heart disease of native coronary artery without angina pectoris: Secondary | ICD-10-CM | POA: Diagnosis not present

## 2020-04-20 IMAGING — CR DG LUMBAR SPINE COMPLETE W/ BEND
1 series · 6 of 6 positions shown · non-contrast
Comparison: MRI 05/24/2017.

CLINICAL DATA: Low back pain.

EXAM:
LUMBAR SPINE - COMPLETE WITH BENDING VIEWS

[Series 1: dg lumbar spine complete w/bend 6+v · 0.14mm/px · 6 of 6 slices shown]
[im 1/6]
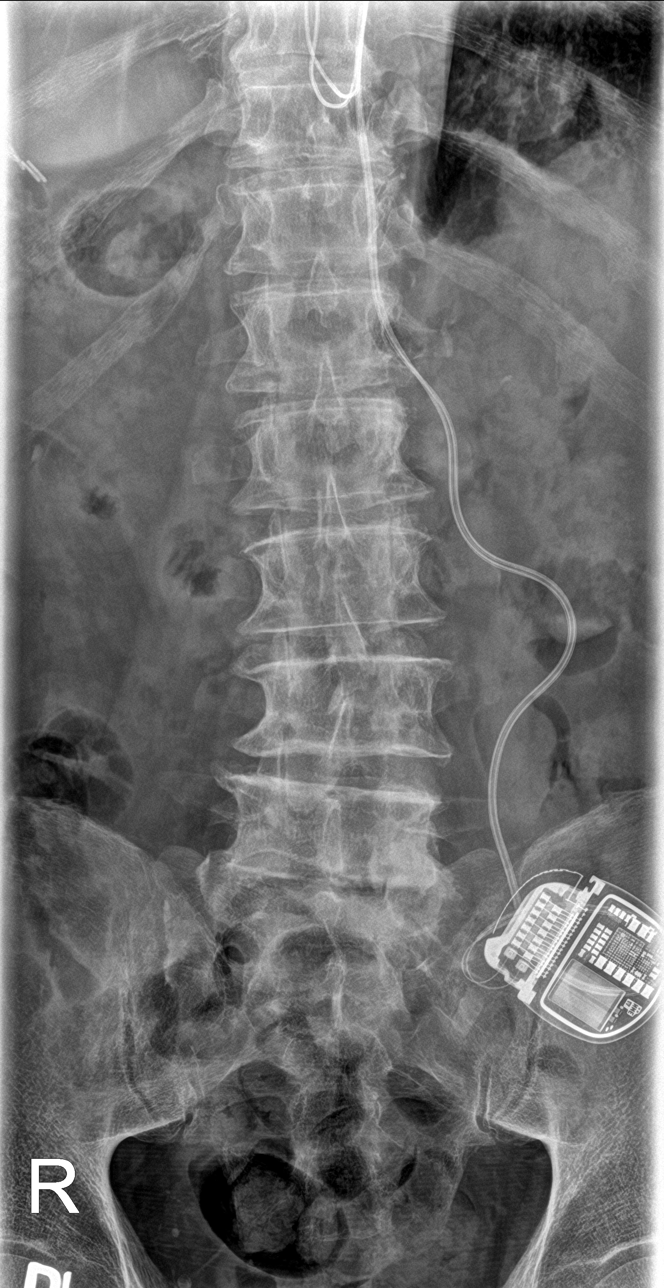
[im 2/6]
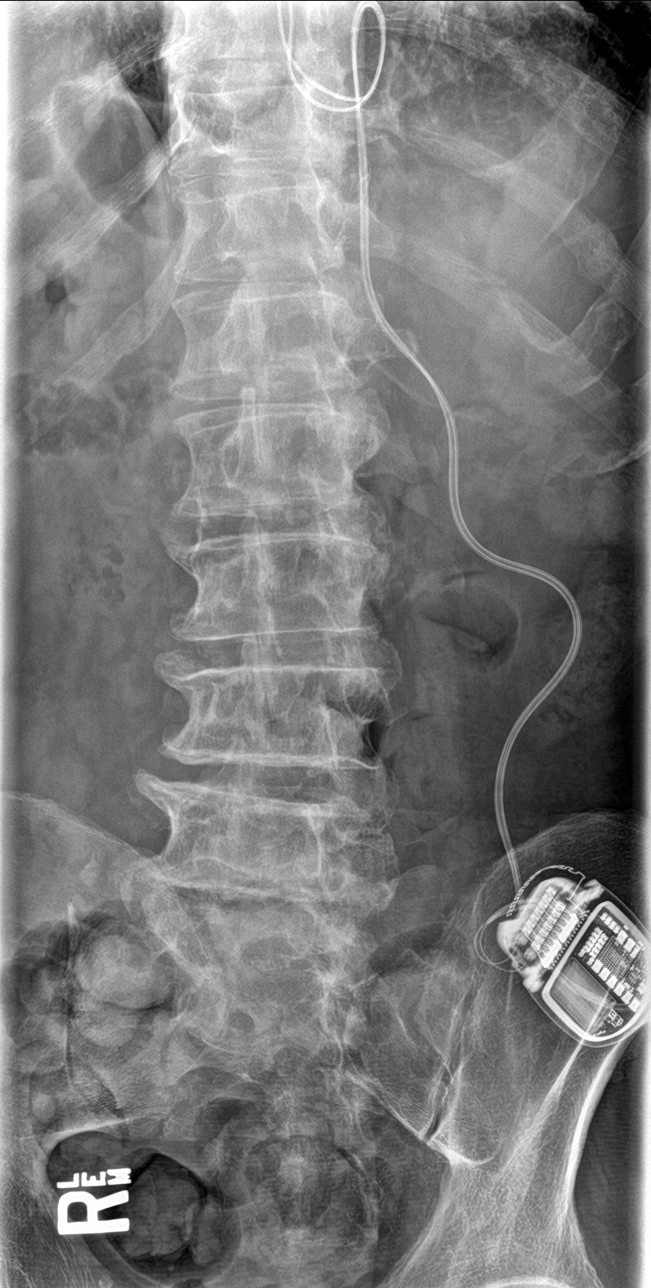
[im 3/6]
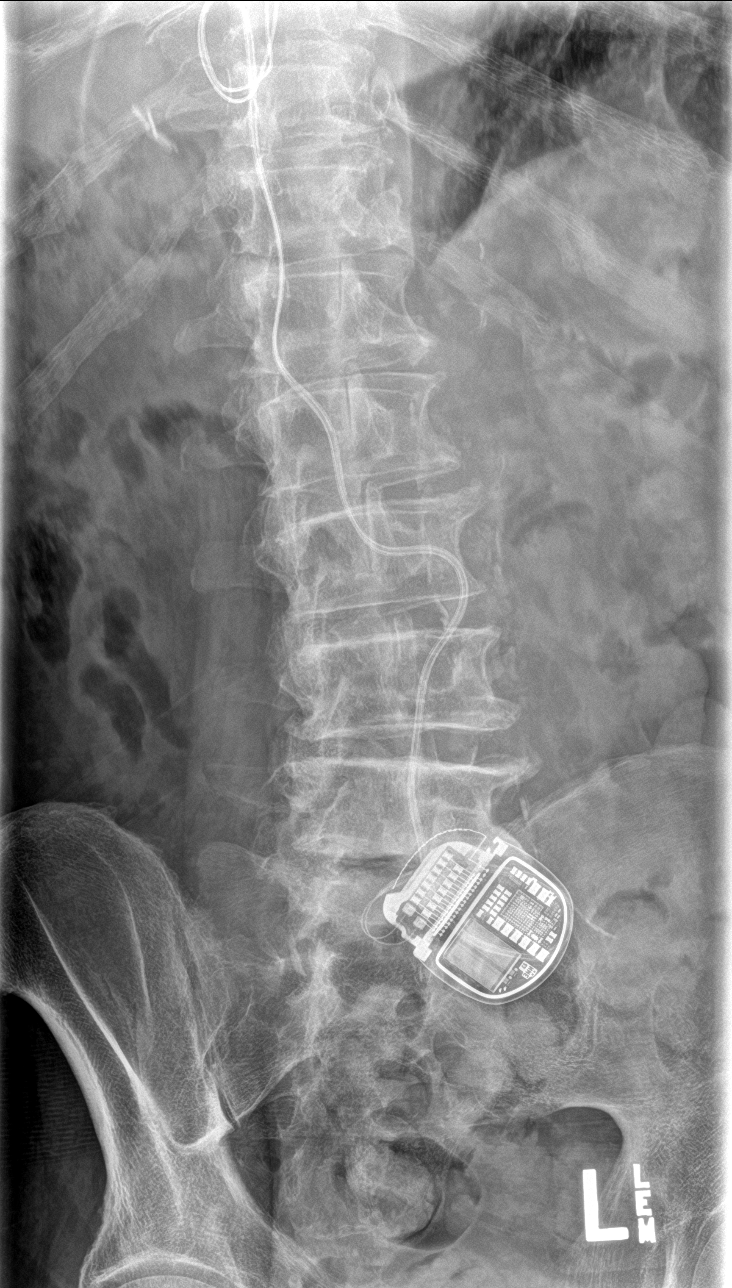
[im 4/6]
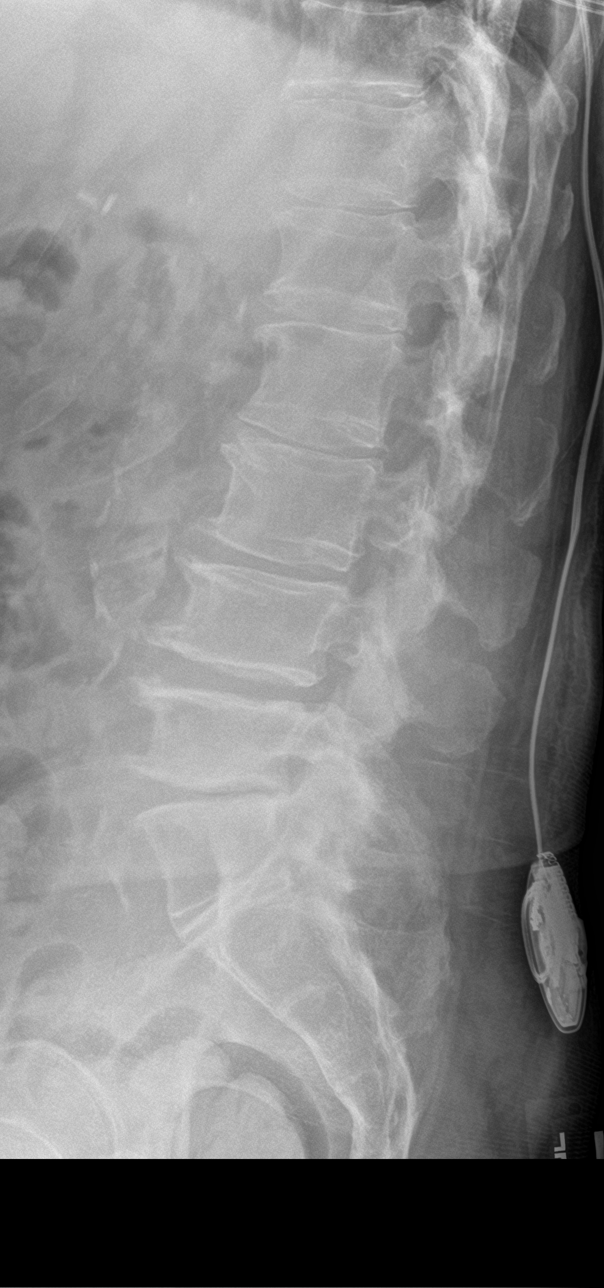
[im 5/6]
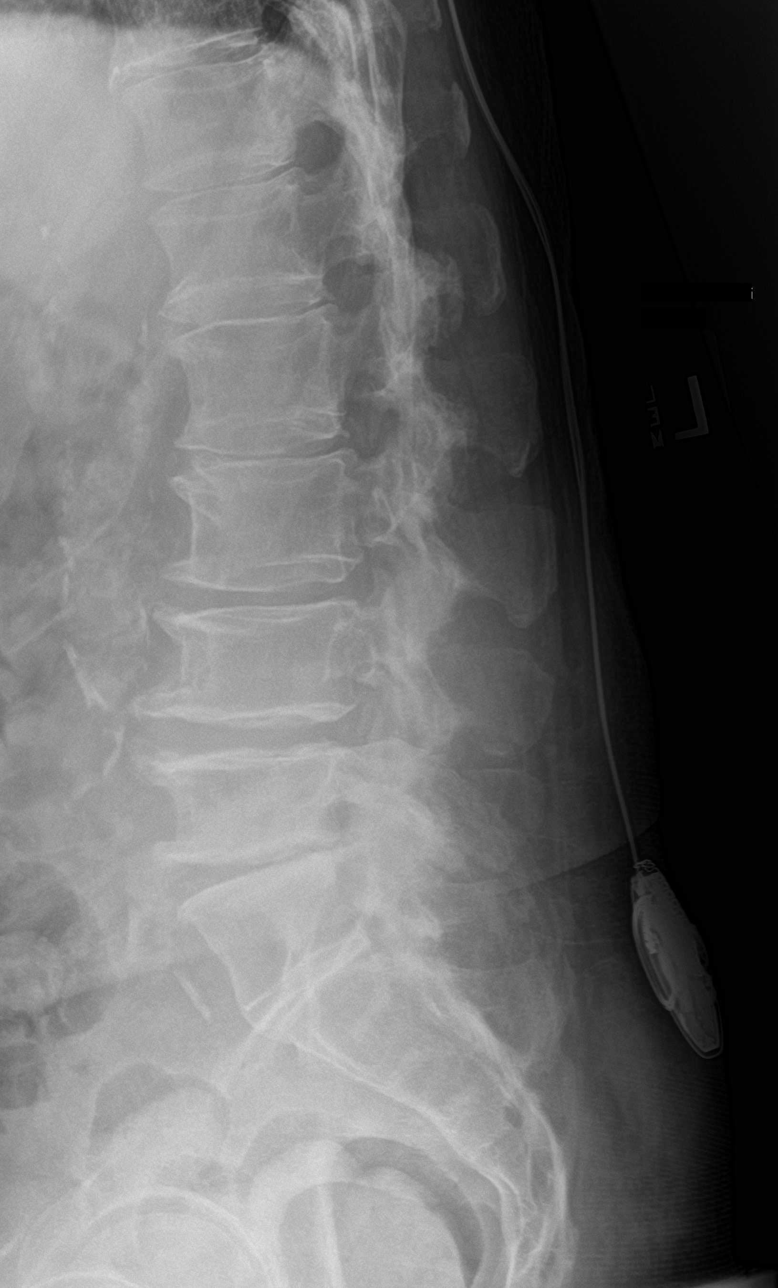
[im 6/6]
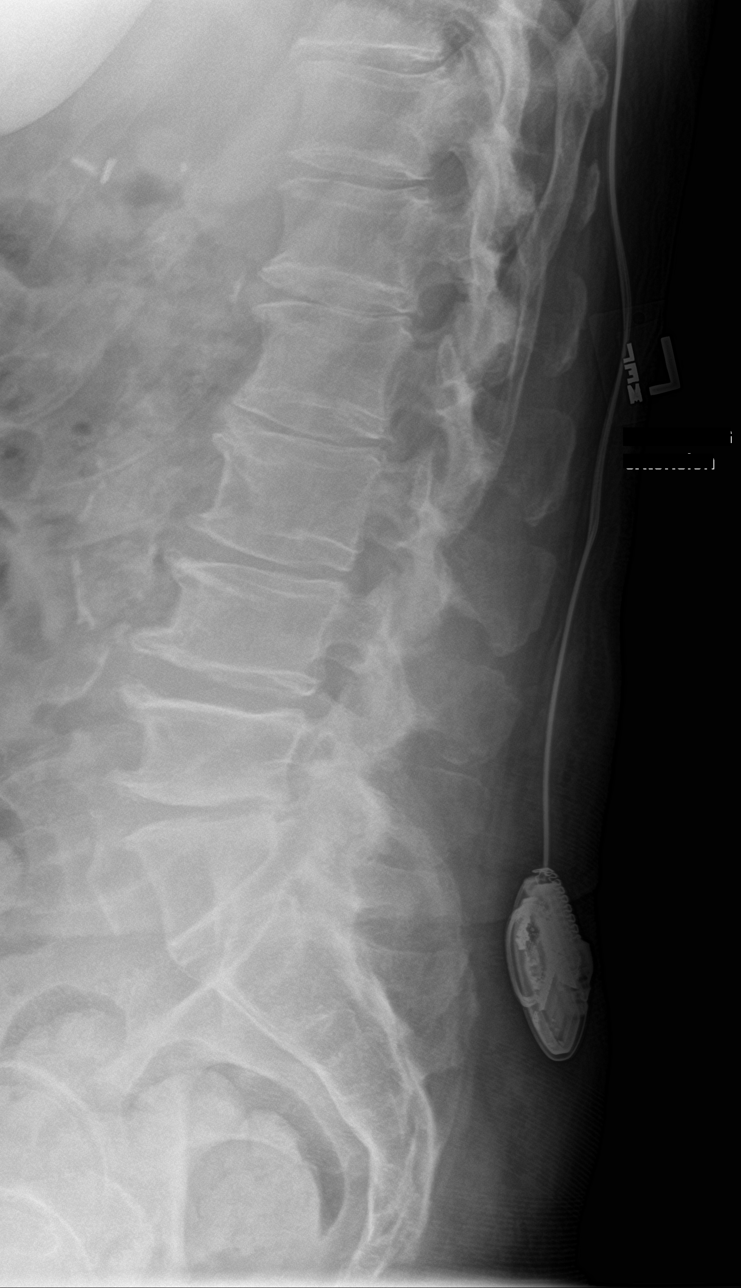

[6 of 6 positions shown; findings below may reference images not displayed]

FINDINGS: Neurostimulator noted. Surgical clips right upper quadrant. Mild
thoracic spine scratched it mild thoracolumbar spine scoliosis.
Diffuse multilevel degenerative change. No acute bony abnormality
identified. No flexion or extension deformity. Aortic
atherosclerotic vascular calcification.
IMPRESSION: 1.  Neurostimulator noted.

2. Mild thoracolumbar spine scoliosis. Diffuse multilevel
degenerative change lumbar spine. No acute bony abnormality. No
flexion or extension deformity noted.

## 2020-05-16 DIAGNOSIS — Z79891 Long term (current) use of opiate analgesic: Secondary | ICD-10-CM | POA: Insufficient documentation

## 2020-05-16 DIAGNOSIS — E039 Hypothyroidism, unspecified: Secondary | ICD-10-CM | POA: Insufficient documentation

## 2020-05-16 DIAGNOSIS — D472 Monoclonal gammopathy: Secondary | ICD-10-CM | POA: Insufficient documentation

## 2020-05-16 DIAGNOSIS — F411 Generalized anxiety disorder: Secondary | ICD-10-CM | POA: Insufficient documentation

## 2020-05-16 DIAGNOSIS — E119 Type 2 diabetes mellitus without complications: Secondary | ICD-10-CM | POA: Insufficient documentation

## 2020-05-16 DIAGNOSIS — K219 Gastro-esophageal reflux disease without esophagitis: Secondary | ICD-10-CM | POA: Insufficient documentation

## 2020-05-16 DIAGNOSIS — F17200 Nicotine dependence, unspecified, uncomplicated: Secondary | ICD-10-CM | POA: Insufficient documentation

## 2020-05-16 DIAGNOSIS — D751 Secondary polycythemia: Secondary | ICD-10-CM | POA: Insufficient documentation

## 2020-05-16 DIAGNOSIS — F419 Anxiety disorder, unspecified: Secondary | ICD-10-CM | POA: Insufficient documentation

## 2020-05-16 NOTE — Progress Notes (Signed)
PROVIDER NOTE: Information contained herein reflects review and annotations entered in association with encounter. Interpretation of such information and data should be left to medically-trained personnel. Information provided to patient can be located elsewhere in the medical record under "Patient Instructions". Document created using STT-dictation technology, any transcriptional errors that may result from process are unintentional.    Patient: Chase Mendez  Service Category: E/M  Provider: Gaspar Cola, MD  DOB: [birth year]  DOS: 05/17/2020  Specialty: Interventional Pain Management  MRN: [id number]  Setting: Ambulatory outpatient  PCP: Jodi Marble, MD  Type: Established Patient    Referring Provider: Jodi Marble, MD  Location: Office  Delivery: Face-to-face     HPI  [name], a 86 y.o. year old male, is here today because of his Chronic pain syndrome [G89.4]. Mr. Landfair primary complain today is Hip Pain (left) Last encounter: My last encounter with him was on 02/19/2020. Pertinent problems: Mr. Nalepa has Spinal accessory neuropathy; Lower limb pain, inferior (L5) (Foot Drop) (Left); Chronic low back pain (Secondary Area of Pain) (Bilateral) (ML) (L>R) with sciatica (Left); Chronic lower extremity pain (Primary Area of Pain) (Left); Failed back surgical syndrome; Foot drop (Left); Lumbosacral radiculopathy at L5 (Left); DDD (degenerative disc disease), lumbar; Chronic pain syndrome; Thoracic central spinal stenosis; Chronic musculoskeletal pain; Neurogenic pain; Presence of neurostimulator; Gout of left foot due to renal impairment; and Disturbance of skin sensation on their pertinent problem list. Pain Assessment: Severity of Chronic pain is reported as a 5 /10. Location: Hip Left/radiates down left leg on the side to toes. Onset: More than a month ago. Quality: Aching,Dull,Sharp. Timing: Intermittent. Modifying factor(s): lying on right side makes it go away. Vitals:   height is 6' (1.829 m) and weight is 188 lb (85.3 kg). His temperature is 97.2 F (36.2 C) (abnormal). His blood pressure is 103/55 (abnormal) and his pulse is 74. His respiration is 16 and oxygen saturation is 94%.   Reason for encounter: medication management.   The patient indicates doing well with the current medication regimen. No adverse reactions or side effects reported to the medications.  Today he wanted to talk about some cracking and popping that he keeps hearing in the cervical area during certain movements.  He denies any significant pain associated with this and today I have talked to him about the possibility of this being associated with some facet joint problems.  I have informed him that should he begin to experience pain going down to the lower back or legs, or if he was to experience severe neck pain with pain going down to the hands through the arms, he should then immediately let me know since it could be a more serious problem.  He understood and accepted and indicated that for now he is doing okay and does not seem to need anything else.  RTCB: 08/18/2020 Nonopioids transferred 01/12/2020: Gabapentin (Neurontin)  Pharmacotherapy Assessment   Analgesic: Oxycodone IR 10 mg, 1 tab PO q 6 hrs. (40 mg/day of oxycodone) MME/day:3m/day.   Monitoring: [place] PMP: PDMP reviewed during this encounter.       Pharmacotherapy: No side-effects or adverse reactions reported. Compliance: No problems identified. Effectiveness: Clinically acceptable.  TDewayne Shorter RN  05/17/2020 11:41 AM  Signed Nursing Pain Medication Assessment:  Safety precautions to be maintained throughout the outpatient stay will include: orient to surroundings, keep bed in low position, maintain call bell within reach at all times, provide assistance with transfer out of bed  and ambulation.  Medication Inspection Compliance: Pill count conducted under aseptic conditions, in front of the patient. Neither the pills  nor the bottle was removed from the patient's sight at any time. Once count was completed pills were immediately returned to the patient in their original bottle.  Medication: Oxycodone IR Pill/Patch Count: 11 of 120 pills remain Pill/Patch Appearance: Markings consistent with prescribed medication Bottle Appearance: Standard pharmacy container. Clearly labeled. Filled Date:03 / 15 / 2022 Last Medication intake:  Today    UDS:  Summary  Date Value Ref Range Status  07/23/2019 Note  Final    Comment:    ==================================================================== [employer] [facility] (MW) ==================================================================== Test                             Result       Flag       Units  Drug Present and Declared for Prescription Verification   Oxycodone                      >3861        EXPECTED   ng/mg creat   Oxymorphone                    2135         EXPECTED   ng/mg creat   Noroxycodone                   >3861        EXPECTED   ng/mg creat   Noroxymorphone                 1084         EXPECTED   ng/mg creat    Sources of oxycodone are scheduled prescription medications.    Oxymorphone, noroxycodone, and noroxymorphone are expected    metabolites of oxycodone. Oxymorphone is also available as a    scheduled prescription medication.  ==================================================================== Test                      Result    Flag   Units      Ref Range   Creatinine              259              mg/dL      >=20 ==================================================================== Declared Medications:  The flagging and interpretation on this report are based on the  following declared medications.  Unexpected results may arise from  inaccuracies in the declared medications.   **Note: The testing scope of this panel includes these medications:   Oxycodone (Roxicodone)   **Note: The testing scope of this panel does not  include the  following reported medications:   Allopurinol (Zyloprim)  Amlodipine (Norvasc)  Aspirin  Atorvastatin (Lipitor)  Citalopram (Celexa)  Cyanocobalamin  Fluticasone (Flonase)  Gabapentin (Neurontin)  Isosorbide (Imdur)  Levothyroxine (Synthroid)  Linaclotide (Linzess)  Losartan (Cozaar)  Mirabegron (Myrbetriq)  Naloxone (Narcan)  Ondansetron (Zofran)  Pantoprazole (Protonix)  Testosterone  Vitamin E ==================================================================== For clinical consultation, please call [phone]. ====================================================================      ROS  Constitutional: Denies any fever or chills Gastrointestinal: No reported hemesis, hematochezia, vomiting, or acute GI distress Musculoskeletal: Denies any acute onset joint swelling, redness, loss of ROM, or weakness Neurological: No reported episodes of acute onset apraxia, aphasia, dysarthria, agnosia, amnesia, paralysis, loss of coordination, or loss of  consciousness  Medication Review  DULoxetine, Oxycodone HCl, Testosterone, allopurinol, amLODipine, aspirin, atorvastatin, benzonatate, diphenhydrAMINE, fluticasone, gabapentin, isosorbide mononitrate, levothyroxine, linaclotide, losartan, naloxone, ondansetron, pantoprazole, tamsulosin, vitamin B-12, and vitamin E  History Review  Allergy: Mr. Noorani is allergic to doxycycline, ibuprofen, and sulfa antibiotics. Drug: Mr. Vokes  reports current drug use. Drug: Oxycodone. Alcohol:  reports no history of alcohol use. Tobacco:  reports that he has been smoking cigars. He has a 35.00 pack-year smoking history. He has never used smokeless tobacco. Social: Mr. Schroepfer  reports that he has been smoking cigars. He has a 35.00 pack-year smoking history. He has never used smokeless tobacco. He reports current drug use. Drug: Oxycodone. He reports that he does not drink alcohol. Medical:  has a past medical history of Acute  encephalopathy (12/08/2014), Anxiety, ARF (acute renal failure) ([place]) (12/08/2014), Back pain, Benign neoplasm of large bowel, Capsulitis, Chronic back pain, Coronavirus infection, Depression, Diabetes mellitus without complication ([place]), Dysphagia, Exostosis, Foot drop, left, Frequent falls (02/2018), GERD (gastroesophageal reflux disease), Gout, Hypertension, Hypothyroidism, Insomnia, Low testosterone, Microscopic hematuria (2016), Myocardial infarction ([place]), Pneumonia (12/07/2014), Pressure ulcer (12/09/2014), Sepsis ([place]) (12/08/2014), and Thyroid disease. Surgical: Mr. Bonsignore  has a past surgical history that includes Cholecystectomy; Esophagogastroduodenoscopy (egd) with propofol (N/A, 04/24/2017); Back surgery (1995, 1996); Eye surgery (Bilateral, 1983, 1985); Appendectomy; and Spinal cord stimulator insertion (N/A, 03/13/2018). Family: family history includes Diabetes in his father; Heart disease in his mother; Kidney cancer in his brother.  Laboratory Chemistry Profile   Renal Lab Results  Component Value Date   BUN 17 01/28/2019   CREATININE 1.30 (H) 12/29/2019   GFRAA >60 01/28/2019   GFRNONAA >60 01/28/2019     Hepatic Lab Results  Component Value Date   AST 75 (H) 11/28/2018   ALT 20 11/28/2018   ALBUMIN 3.9 11/28/2018   ALKPHOS 134 (H) 11/28/2018   HCVAB <0.1 07/24/2018   LIPASE 37 08/21/2016     Electrolytes Lab Results  Component Value Date   NA 140 01/28/2019   K 4.0 01/28/2019   CL 105 01/28/2019   CALCIUM 8.9 01/28/2019     Bone No results found for: VD25OH, [id number], YO3785YI5, OY7741OI7, 25OHVITD1, 25OHVITD2, 25OHVITD3, TESTOFREE, TESTOSTERONE   Inflammation (CRP: Acute Phase) (ESR: Chronic Phase) Lab Results  Component Value Date   LATICACIDVEN 1.1 12/08/2014       Note: Above Lab results reviewed.  Recent Imaging Review  DG Toe 2nd Left CLINICAL DATA:  Second digit laceration.  Pain.  EXAM: LEFT SECOND TOE  COMPARISON:  No  prior.  FINDINGS: Soft tissue swelling. No radiopaque foreign body. Small fracture fragment the base of the distal phalanx of the left second digit noted.  IMPRESSION: 1. Soft tissue swelling. No radiopaque foreign body. 2. Small fracture fragment noted at the base of the distal phalanx of the left second digit.  Electronically Signed   By: Marcello Moores  Register   On: 03/10/2020 08:25 Note: Reviewed        Physical Exam  General appearance: Well nourished, well developed, and well hydrated. In no apparent acute distress Mental status: Alert, oriented x 3 (person, place, & time)       Respiratory: No evidence of acute respiratory distress Eyes: PERLA Vitals: BP (!) 103/55   Pulse 74   Temp (!) 97.2 F (36.2 C)   Resp 16   Ht 6' (1.829 m)   Wt 188 lb (85.3 kg)   SpO2 94%   BMI 25.50 kg/m  BMI: Estimated body mass index  is 25.5 kg/m as calculated from the following:   Height as of this encounter: 6' (1.829 m).   Weight as of this encounter: 188 lb (85.3 kg). Ideal: Ideal body weight: 77.6 kg (171 lb 1.2 oz) Adjusted ideal body weight: 80.7 kg (177 lb 13.5 oz)  Assessment   Status Diagnosis  Controlled Controlled Controlled 1. Chronic pain syndrome   2. Chronic lower extremity pain (Primary Area of Pain) (Left)   3. Chronic low back pain (Secondary Area of Pain) (Bilateral) (ML) (L>R) with sciatica (Left)   4. Failed back surgical syndrome   5. Lower limb pain, inferior (L5) (Foot Drop) (Left)   6. Lumbosacral radiculopathy at L5 (Left)   7. Presence of neurostimulator   8. Pharmacologic therapy   9. Chronic use of opiate for therapeutic purpose   10. Uncomplicated opioid dependence ([place])      Updated Problems: No problems updated.  Plan of Care  Problem-specific:  No problem-specific Assessment & Plan notes found for this encounter.  Mr. KLEBER CREAN has a current medication list which includes the following long-term medication(s): allopurinol, amlodipine,  fluticasone, gabapentin, isosorbide mononitrate, levothyroxine, linaclotide, losartan, [START ON 05/20/2020] oxycodone hcl, [START ON 06/19/2020] oxycodone hcl, [START ON 07/19/2020] oxycodone hcl, pantoprazole, testosterone, and [DISCONTINUED] diphenhydramine.  Pharmacotherapy (Medications Ordered): Meds ordered this encounter  Medications  . Oxycodone HCl 10 MG TABS    Sig: Take 1 tablet (10 mg total) by mouth every 6 (six) hours as needed. Must last 30 days.    Dispense:  120 tablet    Refill:  0    Chronic Pain: STOP Act (Not applicable) Fill 1 day early if closed on refill date. Avoid benzodiazepines within 8 hours of opioids  . Oxycodone HCl 10 MG TABS    Sig: Take 1 tablet (10 mg total) by mouth every 6 (six) hours as needed. Must last 30 days.    Dispense:  120 tablet    Refill:  0    Chronic Pain: STOP Act (Not applicable) Fill 1 day early if closed on refill date. Avoid benzodiazepines within 8 hours of opioids  . Oxycodone HCl 10 MG TABS    Sig: Take 1 tablet (10 mg total) by mouth every 6 (six) hours as needed. Must last 30 days.    Dispense:  120 tablet    Refill:  0    Chronic Pain: STOP Act (Not applicable) Fill 1 day early if closed on refill date. Avoid benzodiazepines within 8 hours of opioids   Orders:  No orders of the defined types were placed in this encounter.  Follow-up plan:   Return in about 3 months (around 08/18/2020) for (F2F), (MM).      Interventional treatment options:  Under consideration:   Diagnostic Caudal ESI + epidurogram #1 Possible Racz procedure Diagnostic bilateral lumbar facet block Possible bilateral lumbar facet RFA Possible candidate for intrathecal pump trial and implant   Therapeutic/palliative (PRN):   Therapeutic/palliative left L4TFESI#2 Therapeutic/palliative left L5TFESI#2 Bilateral spinal cord stimulator trial (done - 01/17/2018)  Permanent bilateral spinal cord stimulator implant by Dr. Cari Caraway (neurosurgery)  (done - 03/13/2018)    Recent Visits Date Type Provider Dept  02/19/20 Procedure visit Milinda Pointer, MD [facility]  Showing recent visits within past 90 days and meeting all other requirements Today's Visits Date Type Provider Dept  05/17/20 Office Visit Milinda Pointer, MD [facility]  Showing today's visits and meeting all other requirements Future Appointments No visits were found meeting these  conditions. Showing future appointments within next 90 days and meeting all other requirements  I discussed the assessment and treatment plan with the patient. The patient was provided an opportunity to ask questions and all were answered. The patient agreed with the plan and demonstrated an understanding of the instructions.  Patient advised to call back or seek an in-person evaluation if the symptoms or condition worsens.  Duration of encounter: 30 minutes.  Note by: Gaspar Cola, MD Date: 05/17/2020; Time: 12:35 PM

## 2020-05-17 ENCOUNTER — Ambulatory Visit: Payer: Worker's Compensation | Attending: Pain Medicine | Admitting: Pain Medicine

## 2020-05-17 ENCOUNTER — Encounter: Payer: Self-pay | Admitting: Pain Medicine

## 2020-05-17 ENCOUNTER — Other Ambulatory Visit: Payer: Self-pay

## 2020-05-17 VITALS — BP 103/55 | HR 74 | Temp 97.2°F | Resp 16 | Ht 72.0 in | Wt 188.0 lb

## 2020-05-17 DIAGNOSIS — Z9682 Presence of neurostimulator: Secondary | ICD-10-CM | POA: Diagnosis present

## 2020-05-17 DIAGNOSIS — G8929 Other chronic pain: Secondary | ICD-10-CM

## 2020-05-17 DIAGNOSIS — F1120 Opioid dependence, uncomplicated: Secondary | ICD-10-CM | POA: Diagnosis present

## 2020-05-17 DIAGNOSIS — Z79891 Long term (current) use of opiate analgesic: Secondary | ICD-10-CM

## 2020-05-17 DIAGNOSIS — Z79899 Other long term (current) drug therapy: Secondary | ICD-10-CM | POA: Diagnosis present

## 2020-05-17 DIAGNOSIS — M79605 Pain in left leg: Secondary | ICD-10-CM | POA: Diagnosis not present

## 2020-05-17 DIAGNOSIS — G894 Chronic pain syndrome: Secondary | ICD-10-CM

## 2020-05-17 DIAGNOSIS — M5442 Lumbago with sciatica, left side: Secondary | ICD-10-CM

## 2020-05-17 DIAGNOSIS — M5417 Radiculopathy, lumbosacral region: Secondary | ICD-10-CM | POA: Diagnosis present

## 2020-05-17 DIAGNOSIS — M961 Postlaminectomy syndrome, not elsewhere classified: Secondary | ICD-10-CM | POA: Diagnosis not present

## 2020-05-17 MED ORDER — OXYCODONE HCL 10 MG PO TABS: 10.0000 mg | ORAL_TABLET | Freq: Four times a day (QID) | ORAL | 0 refills | Status: DC | PRN

## 2020-05-17 MED ORDER — OXYCODONE HCL 10 MG PO TABS
10.0000 mg | ORAL_TABLET | Freq: Four times a day (QID) | ORAL | 0 refills | Status: DC | PRN
Start: 2020-05-20 — End: 2020-08-17

## 2020-05-17 NOTE — Progress Notes (Signed)
Nursing Pain Medication Assessment:  Safety precautions to be maintained throughout the outpatient stay will include: orient to surroundings, keep bed in low position, maintain call bell within reach at all times, provide assistance with transfer out of bed and ambulation.  Medication Inspection Compliance: Pill count conducted under aseptic conditions, in front of the patient. Neither the pills nor the bottle was removed from the patient's sight at any time. Once count was completed pills were immediately returned to the patient in their original bottle.  Medication: Oxycodone IR Pill/Patch Count: 11 of 120 pills remain Pill/Patch Appearance: Markings consistent with prescribed medication Bottle Appearance: Standard pharmacy container. Clearly labeled. Filled Date:03 / 15 / 2022 Last Medication intake:  Today

## 2020-05-17 NOTE — Patient Instructions (Signed)
____________________________________________________________________________________________  Medication Recommendations and Reminders  Applies to: All patients receiving prescriptions (written and/or electronic).  Medication Rules & Regulations: These rules and regulations exist for your safety and that of others. They are not flexible and neither are we. Dismissing or ignoring them will be considered "non-compliance" with medication therapy, resulting in complete and irreversible termination of such therapy. (See document titled "Medication Rules" for more details.) In all conscience, because of safety reasons, we cannot continue providing a therapy where the patient does not follow instructions.  Pharmacy of record:   Definition: This is the pharmacy where your electronic prescriptions will be sent.   We do not endorse any particular pharmacy, however, we have experienced problems with [facility] not securing enough medication supply for the community.  We do not restrict you in your choice of pharmacy. However, once we write for your prescriptions, we will NOT be re-sending more prescriptions to fix restricted supply problems created by your pharmacy, or your insurance.   The pharmacy listed in the electronic medical record should be the one where you want electronic prescriptions to be sent.  If you choose to change pharmacy, simply notify our nursing staff.  Recommendations:  Keep all of your pain medications in a safe place, under lock and key, even if you live alone. We will NOT replace lost, stolen, or damaged medication.  After you fill your prescription, take 1 week's worth of pills and put them away in a safe place. You should keep a separate, properly labeled bottle for this purpose. The remainder should be kept in the original bottle. Use this as your primary supply, until it runs out. Once it's gone, then you know that you have 1 week's worth of medicine, and it is time to come  in for a prescription refill. If you do this correctly, it is unlikely that you will ever run out of medicine.  To make sure that the above recommendation works, it is very important that you make sure your medication refill appointments are scheduled at least 1 week before you run out of medicine. To do this in an effective manner, make sure that you do not leave the office without scheduling your next medication management appointment. Always ask the nursing staff to show you in your prescription , when your medication will be running out. Then arrange for the receptionist to get you a return appointment, at least 7 days before you run out of medicine. Do not wait until you have 1 or 2 pills left, to come in. This is very poor planning and does not take into consideration that we may need to cancel appointments due to bad weather, sickness, or emergencies affecting our staff.  DO NOT ACCEPT A "Partial Fill": If for any reason your pharmacy does not have enough pills/tablets to completely fill or refill your prescription, do not allow for a "partial fill". The law allows the pharmacy to complete that prescription within 72 hours, without requiring a new prescription. If they do not fill the rest of your prescription within those 72 hours, you will need a separate prescription to fill the remaining amount, which we will NOT provide. If the reason for the partial fill is your insurance, you will need to talk to the pharmacist about payment alternatives for the remaining tablets, but again, DO NOT ACCEPT A PARTIAL FILL, unless you can trust your pharmacist to obtain the remainder of the pills within 72 hours.  Prescription refills and/or changes in medication(s):     Prescription refills, and/or changes in dose or medication, will be conducted only during scheduled medication management appointments. (Applies to both, written and electronic prescriptions.)  No refills on procedure days. No medication will be  changed or started on procedure days. No changes, adjustments, and/or refills will be conducted on a procedure day. Doing so will interfere with the diagnostic portion of the procedure.  No phone refills. No medications will be "called into the pharmacy".  No Fax refills.  No weekend refills.  No Holliday refills.  No after hours refills.  Remember:  Business hours are:  Monday to Thursday 8:00 AM to 4:00 PM Provider's Schedule: Akyra Bouchie, MD - Appointments are:  Medication management: Monday and Wednesday 8:00 AM to 4:00 PM Procedure day: Tuesday and Thursday 7:30 AM to 4:00 PM Bilal Lateef, MD - Appointments are:  Medication management: Tuesday and Thursday 8:00 AM to 4:00 PM Procedure day: Monday and Wednesday 7:30 AM to 4:00 PM (Last update: 08/27/2019) ____________________________________________________________________________________________   ____________________________________________________________________________________________  CBD (cannabidiol) WARNING  Applicable to: All individuals currently taking or considering taking CBD (cannabidiol) and, more important, all patients taking opioid analgesic controlled substances (pain medication). (Example: oxycodone; oxymorphone; hydrocodone; hydromorphone; morphine; methadone; tramadol; tapentadol; fentanyl; buprenorphine; butorphanol; dextromethorphan; meperidine; codeine; etc.)  Legal status: CBD remains a Schedule I drug prohibited for any use. CBD is illegal with one exception. In the [place], CBD has a limited Food and Drug Administration (FDA) approval for the treatment of two specific types of epilepsy disorders. Only one CBD product has been approved by the FDA for this purpose: "Epidiolex". FDA is aware that some companies are marketing products containing cannabis and cannabis-derived compounds in ways that violate the Federal Food, Drug and Cosmetic Act (FD&C Act) and that may put the health and safety  of consumers at risk. The FDA, a Federal agency, has not enforced the CBD status since 2018.   Legality: Some manufacturers ship CBD products nationally, which is illegal. Often such products are sold online and are therefore available throughout the country. CBD is openly sold in head shops and health food stores in some states where such sales have not been explicitly legalized. Selling unapproved products with unsubstantiated therapeutic claims is not only a violation of the law, but also can put patients at risk, as these products have not been proven to be safe or effective. Federal illegality makes it difficult to conduct research on CBD.  Reference: "FDA Regulation of Cannabis and Cannabis-Derived Products, Including Cannabidiol (CBD)" - https://www.fda.gov/news-events/public-health-focus/fda-regulation-cannabis-and-cannabis-derived-products-including-cannabidiol-cbd  Warning: CBD is not FDA approved and has not undergo the same manufacturing controls as prescription drugs.  This means that the purity and safety of available CBD may be questionable. Most of the time, despite manufacturer's claims, it is contaminated with THC (delta-9-tetrahydrocannabinol - the chemical in marijuana responsible for the "HIGH").  When this is the case, the THC contaminant will trigger a positive urine drug screen (UDS) test for Marijuana (carboxy-THC). Because a positive UDS for any illicit substance is a violation of our medication agreement, your opioid analgesics (pain medicine) may be permanently discontinued.  MORE ABOUT CBD  General Information: CBD  is a derivative of the Marijuana (cannabis sativa) plant discovered in 1940. It is one of the 113 identified substances found in Marijuana. It accounts for up to 40% of the plant's extract. As of 2018, preliminary clinical studies on CBD included research for the treatment of anxiety, movement disorders, and pain. CBD is available and consumed in multiple forms,  including   inhalation of smoke or vapor, as an aerosol spray, and by mouth. It may be supplied as an oil containing CBD, capsules, dried cannabis, or as a liquid solution. CBD is thought not to be as psychoactive as THC (delta-9-tetrahydrocannabinol - the chemical in marijuana responsible for the "HIGH"). Studies suggest that CBD may interact with different biological target receptors in the body, including cannabinoid and other neurotransmitter receptors. As of 2018 the mechanism of action for its biological effects has not been determined.  Side-effects  Adverse reactions: Dry mouth, diarrhea, decreased appetite, fatigue, drowsiness, malaise, weakness, sleep disturbances, and others.  Drug interactions: CBC may interact with other medications such as blood-thinners. (Last update: 09/13/2019) ____________________________________________________________________________________________   ____________________________________________________________________________________________  Medication Rules  Purpose: To inform patients, and their family members, of our rules and regulations.  Applies to: All patients receiving prescriptions (written or electronic).  Pharmacy of record: Pharmacy where electronic prescriptions will be sent. If written prescriptions are taken to a different pharmacy, please inform the nursing staff. The pharmacy listed in the electronic medical record should be the one where you would like electronic prescriptions to be sent.  Electronic prescriptions: In compliance with the [place] Strengthen Opioid Misuse Prevention (STOP) Act of 2017 (Session Law 2017-74/H243), effective February 06, 2018, all controlled substances must be electronically prescribed. Calling prescriptions to the pharmacy will cease to exist.  Prescription refills: Only during scheduled appointments. Applies to all prescriptions.  NOTE: The following applies primarily to controlled substances (Opioid*  Pain Medications).   Type of encounter (visit): For patients receiving controlled substances, face-to-face visits are required. (Not an option or up to the patient.)  Patient's responsibilities: 1. Pain Pills: Bring all pain pills to every appointment (except for procedure appointments). 2. Pill Bottles: Bring pills in original pharmacy bottle. Always bring the newest bottle. Bring bottle, even if empty. 3. Medication refills: You are responsible for knowing and keeping track of what medications you take and those you need refilled. The day before your appointment: write a list of all prescriptions that need to be refilled. The day of the appointment: give the list to the admitting nurse. Prescriptions will be written only during appointments. No prescriptions will be written on procedure days. If you forget a medication: it will not be "Called in", "Faxed", or "electronically sent". You will need to get another appointment to get these prescribed. No early refills. Do not call asking to have your prescription filled early. 4. Prescription Accuracy: You are responsible for carefully inspecting your prescriptions before leaving our office. Have the discharge nurse carefully go over each prescription with you, before taking them home. Make sure that your name is accurately spelled, that your address is correct. Check the name and dose of your medication to make sure it is accurate. Check the number of pills, and the written instructions to make sure they are clear and accurate. Make sure that you are given enough medication to last until your next medication refill appointment. 5. Taking Medication: Take medication as prescribed. When it comes to controlled substances, taking less pills or less frequently than prescribed is permitted and encouraged. Never take more pills than instructed. Never take medication more frequently than prescribed.  6. Inform other Doctors: Always inform, all of your  healthcare providers, of all the medications you take. 7. Pain Medication from other Providers: You are not allowed to accept any additional pain medication from any other Doctor or Healthcare provider. There are two exceptions to this rule. (see below) In   the event that you require additional pain medication, you are responsible for notifying us, as stated below. 8. Cough Medicine: Often these contain an opioid, such as codeine or hydrocodone. Never accept or take cough medicine containing these opioids if you are already taking an opioid* medication. The combination may cause respiratory failure and death. 9. Medication Agreement: You are responsible for carefully reading and following our Medication Agreement. This must be signed before receiving any prescriptions from our practice. Safely store a copy of your signed Agreement. Violations to the Agreement will result in no further prescriptions. (Additional copies of our Medication Agreement are available upon request.) 10. Laws, Rules, & Regulations: All patients are expected to follow all Federal and State Laws, Statutes, Rules, & Regulations. Ignorance of the Laws does not constitute a valid excuse.  11. Illegal drugs and Controlled Substances: The use of illegal substances (including, but not limited to marijuana and its derivatives) and/or the illegal use of any controlled substances is strictly prohibited. Violation of this rule may result in the immediate and permanent discontinuation of any and all prescriptions being written by our practice. The use of any illegal substances is prohibited. 12. Adopted CDC guidelines & recommendations: Target dosing levels will be at or below 60 MME/day. Use of benzodiazepines** is not recommended.  Exceptions: There are only two exceptions to the rule of not receiving pain medications from other Healthcare Providers. 1. Exception #1 (Emergencies): In the event of an emergency (i.e.: accident requiring emergency  care), you are allowed to receive additional pain medication. However, you are responsible for: As soon as you are able, call our office [phone], at any time of the day or night, and leave a message stating your name, the date and nature of the emergency, and the name and dose of the medication prescribed. In the event that your call is answered by a member of our staff, make sure to document and save the date, time, and the name of the person that took your information.  2. Exception #2 (Planned Surgery): In the event that you are scheduled by another doctor or dentist to have any type of surgery or procedure, you are allowed (for a period no longer than 30 days), to receive additional pain medication, for the acute post-op pain. However, in this case, you are responsible for picking up a copy of our "Post-op Pain Management for Surgeons" handout, and giving it to your surgeon or dentist. This document is available at our office, and does not require an appointment to obtain it. Simply go to our office during business hours (Monday-Thursday from 8:00 AM to 4:00 PM) (Friday 8:00 AM to 12:00 Noon) or if you have a scheduled appointment with us, prior to your surgery, and ask for it by name. In addition, you are responsible for: calling our office [phone], at any time of the day or night, and leaving a message stating your name, name of your surgeon, type of surgery, and date of procedure or surgery. Failure to comply with your responsibilities may result in termination of therapy involving the controlled substances.  *Opioid medications include: morphine, codeine, oxycodone, oxymorphone, hydrocodone, hydromorphone, meperidine, tramadol, tapentadol, buprenorphine, fentanyl, methadone. **Benzodiazepine medications include: diazepam (Valium), alprazolam (Xanax), clonazepam (Klonopine), lorazepam (Ativan), clorazepate (Tranxene), chlordiazepoxide (Librium), estazolam (Prosom), oxazepam (Serax),  temazepam (Restoril), triazolam (Halcion) (Last updated: 01/05/2020) ____________________________________________________________________________________________    

## 2020-05-25 DIAGNOSIS — I10 Essential (primary) hypertension: Secondary | ICD-10-CM | POA: Diagnosis not present

## 2020-05-25 DIAGNOSIS — R69 Illness, unspecified: Secondary | ICD-10-CM | POA: Diagnosis not present

## 2020-05-25 DIAGNOSIS — E785 Hyperlipidemia, unspecified: Secondary | ICD-10-CM | POA: Diagnosis not present

## 2020-05-25 DIAGNOSIS — I2510 Atherosclerotic heart disease of native coronary artery without angina pectoris: Secondary | ICD-10-CM | POA: Diagnosis not present
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
## 2020-05-31 ENCOUNTER — Other Ambulatory Visit: Payer: Self-pay | Admitting: Pain Medicine

## 2020-05-31 DIAGNOSIS — M792 Neuralgia and neuritis, unspecified: Secondary | ICD-10-CM

## 2020-06-04 ENCOUNTER — Telehealth: Payer: Self-pay | Admitting: Pain Medicine

## 2020-06-07 NOTE — Telephone Encounter (Signed)
Spoke with patient's wife, advised her that all non opioid prescriptions have been transferred to PCP

## 2020-07-16 DIAGNOSIS — H5340 Unspecified visual field defects: Secondary | ICD-10-CM | POA: Diagnosis not present

## 2020-07-19 DIAGNOSIS — I10 Essential (primary) hypertension: Secondary | ICD-10-CM | POA: Diagnosis not present

## 2020-07-19 DIAGNOSIS — H353131 Nonexudative age-related macular degeneration, bilateral, early dry stage: Secondary | ICD-10-CM | POA: Diagnosis not present

## 2020-07-19 DIAGNOSIS — E119 Type 2 diabetes mellitus without complications: Secondary | ICD-10-CM | POA: Diagnosis not present

## 2020-07-19 DIAGNOSIS — Z0100 Encounter for examination of eyes and vision without abnormal findings: Secondary | ICD-10-CM | POA: Diagnosis not present

## 2020-07-19 DIAGNOSIS — E039 Hypothyroidism, unspecified: Secondary | ICD-10-CM | POA: Diagnosis not present

## 2020-07-23 DIAGNOSIS — R7303 Prediabetes: Secondary | ICD-10-CM | POA: Diagnosis not present

## 2020-07-23 DIAGNOSIS — I2510 Atherosclerotic heart disease of native coronary artery without angina pectoris: Secondary | ICD-10-CM | POA: Diagnosis not present

## 2020-07-23 DIAGNOSIS — J329 Chronic sinusitis, unspecified: Secondary | ICD-10-CM | POA: Diagnosis not present

## 2020-07-23 DIAGNOSIS — J209 Acute bronchitis, unspecified: Secondary | ICD-10-CM | POA: Diagnosis not present

## 2020-07-23 DIAGNOSIS — I10 Essential (primary) hypertension: Secondary | ICD-10-CM | POA: Diagnosis not present

## 2020-07-23 DIAGNOSIS — K219 Gastro-esophageal reflux disease without esophagitis: Secondary | ICD-10-CM | POA: Diagnosis not present

## 2020-07-23 DIAGNOSIS — M109 Gout, unspecified: Secondary | ICD-10-CM | POA: Diagnosis not present

## 2020-07-23 DIAGNOSIS — R0602 Shortness of breath: Secondary | ICD-10-CM | POA: Diagnosis not present

## 2020-07-23 DIAGNOSIS — E785 Hyperlipidemia, unspecified: Secondary | ICD-10-CM | POA: Diagnosis not present

## 2020-08-02 DIAGNOSIS — N401 Enlarged prostate with lower urinary tract symptoms: Secondary | ICD-10-CM | POA: Diagnosis not present

## 2020-08-02 DIAGNOSIS — Z79899 Other long term (current) drug therapy: Secondary | ICD-10-CM | POA: Diagnosis not present

## 2020-08-02 DIAGNOSIS — N3281 Overactive bladder: Secondary | ICD-10-CM | POA: Diagnosis not present

## 2020-08-02 DIAGNOSIS — E291 Testicular hypofunction: Secondary | ICD-10-CM | POA: Diagnosis not present

## 2020-08-02 DIAGNOSIS — N5201 Erectile dysfunction due to arterial insufficiency: Secondary | ICD-10-CM | POA: Diagnosis not present

## 2020-08-04 DIAGNOSIS — E785 Hyperlipidemia, unspecified: Secondary | ICD-10-CM | POA: Diagnosis not present

## 2020-08-04 DIAGNOSIS — I10 Essential (primary) hypertension: Secondary | ICD-10-CM | POA: Diagnosis not present

## 2020-08-04 DIAGNOSIS — G8929 Other chronic pain: Secondary | ICD-10-CM | POA: Diagnosis not present

## 2020-08-04 DIAGNOSIS — I252 Old myocardial infarction: Secondary | ICD-10-CM | POA: Diagnosis not present

## 2020-08-04 DIAGNOSIS — I2510 Atherosclerotic heart disease of native coronary artery without angina pectoris: Secondary | ICD-10-CM | POA: Diagnosis not present

## 2020-08-04 DIAGNOSIS — R69 Illness, unspecified: Secondary | ICD-10-CM | POA: Diagnosis not present

## 2020-08-04 DIAGNOSIS — J309 Allergic rhinitis, unspecified: Secondary | ICD-10-CM | POA: Diagnosis not present

## 2020-08-04 DIAGNOSIS — G629 Polyneuropathy, unspecified: Secondary | ICD-10-CM | POA: Diagnosis not present

## 2020-08-04 DIAGNOSIS — Z008 Encounter for other general examination: Secondary | ICD-10-CM | POA: Diagnosis not present

## 2020-08-04 DIAGNOSIS — E039 Hypothyroidism, unspecified: Secondary | ICD-10-CM | POA: Diagnosis not present

## 2020-08-17 NOTE — Progress Notes (Signed)
PROVIDER NOTE: Information contained herein reflects review and annotations entered in association with encounter. Interpretation of such information and data should be left to medically-trained personnel. Information provided to patient can be located elsewhere in the medical record under "Patient Instructions". Document created using STT-dictation technology, any transcriptional errors that may result from process are unintentional.    Patient: Chase Mendez  Service Category: E/M  Provider: Gaspar Cola, MD  DOB: Oct 14, 1934  DOS: 08/18/2020  Specialty: Interventional Pain Management  MRN: [id number]  Setting: Ambulatory outpatient  PCP: Jodi Marble, MD  Type: Established Patient    Referring Provider: Jodi Marble, MD  Location: Office  Delivery: Face-to-face     HPI  Chase Mendez, a 86 y.o. year old male, is here today because of his Chronic pain syndrome [G89.4]. Mr. Okey primary complain today is Back Pain Last encounter: My last encounter with him was on 06/04/2020. Pertinent problems: Chase Mendez has Spinal accessory neuropathy; Lower limb pain, inferior (L5) (Foot Drop) (Left); Chronic low back pain (2ry area of Pain) (Bilateral) (ML) (L>R) w/ sciatica (Left); Chronic lower extremity pain (1ry area of Pain) (Left); Failed back surgical syndrome; Foot drop (Left); Lumbosacral radiculopathy at L5 (Left); DDD (degenerative disc disease), lumbar; Chronic pain syndrome; Thoracic central spinal stenosis; Chronic musculoskeletal pain; Neurogenic pain; Presence of neurostimulator; Gout of left foot due to renal impairment; and Disturbance of skin sensation on their pertinent problem list. Pain Assessment: Severity of Chronic pain is reported as a 5 /10. Location: Back Lower, Left/buttocks/hip down the side of the leg to the top of foot, effects all toes. Onset: More than a month ago. Quality: Aching, Constant, Discomfort. Timing: Constant. Modifying factor(s): meds,  rest. Vitals:  height is 6' (1.829 m) and weight is 190 lb (86.2 kg). His temperature is 97.1 F (36.2 C) (abnormal). His blood pressure is 115/60 and his pulse is 76. His oxygen saturation is 97%.   Reason for encounter: medication management.   The patient indicates doing well with the current medication regimen. No adverse reactions or side effects reported to the medications. UDS ordered today.   RTCB: 11/16/2020 Nonopioids transferred 01/12/2020: Gabapentin (Neurontin)  Pharmacotherapy Assessment  Analgesic: Oxycodone IR 10 mg, 1 tab PO q 6 hrs. (40 mg/day of oxycodone) MME/day: 60 mg/day.   Monitoring: [place] PMP: PDMP reviewed during this encounter.       Pharmacotherapy: No side-effects or adverse reactions reported. Compliance: No problems identified. Effectiveness: Clinically acceptable.  Ignatius Specking, RN  08/18/2020 10:50 AM  Sign when Signing Visit Nursing Pain Medication Assessment:  Safety precautions to be maintained throughout the outpatient stay will include: orient to surroundings, keep bed in low position, maintain call bell within reach at all times, provide assistance with transfer out of bed and ambulation.  Medication Inspection Compliance: Pill count conducted under aseptic conditions, in front of the patient. Neither the pills nor the bottle was removed from the patient's sight at any time. Once count was completed pills were immediately returned to the patient in their original bottle.  Medication: Oxycodone IR Pill/Patch Count:  0 of 120 pills remain Pill/Patch Appearance: Markings consistent with prescribed medication Bottle Appearance: Standard pharmacy container. Clearly labeled. Filled Date: 6 / 72 / 2022 Last Medication intake:  Today     UDS:  Summary  Date Value Ref Range Status  07/23/2019 Note  Final    Comment:    ==================================================================== [employer] Select 13  (MW) ==================================================================== Test  Result       Flag       Units  Drug Present and Declared for Prescription Verification   Oxycodone                      >3861        EXPECTED   ng/mg creat   Oxymorphone                    2135         EXPECTED   ng/mg creat   Noroxycodone                   >3861        EXPECTED   ng/mg creat   Noroxymorphone                 1084         EXPECTED   ng/mg creat    Sources of oxycodone are scheduled prescription medications.    Oxymorphone, noroxycodone, and noroxymorphone are expected    metabolites of oxycodone. Oxymorphone is also available as a    scheduled prescription medication.  ==================================================================== Test                      Result    Flag   Units      Ref Range   Creatinine              259              mg/dL      >=20 ==================================================================== Declared Medications:  The flagging and interpretation on this report are based on the  following declared medications.  Unexpected results may arise from  inaccuracies in the declared medications.   **Note: The testing scope of this panel includes these medications:   Oxycodone (Roxicodone)   **Note: The testing scope of this panel does not include the  following reported medications:   Allopurinol (Zyloprim)  Amlodipine (Norvasc)  Aspirin  Atorvastatin (Lipitor)  Citalopram (Celexa)  Cyanocobalamin  Fluticasone (Flonase)  Gabapentin (Neurontin)  Isosorbide (Imdur)  Levothyroxine (Synthroid)  Linaclotide (Linzess)  Losartan (Cozaar)  Mirabegron (Myrbetriq)  Naloxone (Narcan)  Ondansetron (Zofran)  Pantoprazole (Protonix)  Testosterone  Vitamin E ==================================================================== For clinical consultation, please call [phone]. ====================================================================      ROS  Constitutional: Denies any fever or chills Gastrointestinal: No reported hemesis, hematochezia, vomiting, or acute GI distress Musculoskeletal: Denies any acute onset joint swelling, redness, loss of ROM, or weakness Neurological: No reported episodes of acute onset apraxia, aphasia, dysarthria, agnosia, amnesia, paralysis, loss of coordination, or loss of consciousness  Medication Review  DULoxetine, Oxycodone HCl, Testosterone, allopurinol, amLODipine, aspirin, atorvastatin, benzonatate, diphenhydrAMINE, gabapentin, isosorbide mononitrate, levothyroxine, linaclotide, losartan, naloxone, ondansetron, pantoprazole, vitamin B-12, and vitamin E  History Review  Allergy: Chase Mendez is allergic to doxycycline, ibuprofen, and sulfa antibiotics. Drug: Chase Mendez  reports current drug use. Drug: Oxycodone. Alcohol:  reports no history of alcohol use. Tobacco:  reports that he has been smoking cigars and cigarettes. He has a 35.00 pack-year smoking history. He has never used smokeless tobacco. Social: Chase Mendez  reports that he has been smoking cigars and cigarettes. He has a 35.00 pack-year smoking history. He has never used smokeless tobacco. He reports current drug use. Drug: Oxycodone. He reports that he does not drink alcohol. Medical:  has a past medical history of Acute encephalopathy (12/08/2014), Anxiety, ARF (acute renal failure) (  [place]) (12/08/2014), Back pain, Benign neoplasm of large bowel, Capsulitis, Chronic back pain, Coronavirus infection, Depression, Diabetes mellitus without complication ([place]), Dysphagia, Exostosis, Foot drop, left, Frequent falls (02/2018), GERD (gastroesophageal reflux disease), Gout, Hypertension, Hypothyroidism, Insomnia, Low testosterone, Microscopic hematuria (2016), Myocardial infarction ([place]), Pneumonia (12/07/2014), Pressure ulcer (12/09/2014), Sepsis ([place]) (12/08/2014), and  Thyroid disease. Surgical: Chase Mendez  has a past surgical history that includes Cholecystectomy; Esophagogastroduodenoscopy (egd) with propofol (N/A, 04/24/2017); Back surgery (1995, 1996); Eye surgery (Bilateral, 1983, 1985); Appendectomy; and Spinal cord stimulator insertion (N/A, 03/13/2018). Family: family history includes Diabetes in his father; Heart disease in his mother; Kidney cancer in his brother.  Laboratory Chemistry Profile   Renal Lab Results  Component Value Date   BUN 17 01/28/2019   CREATININE 1.30 (H) 12/29/2019   GFRAA >60 01/28/2019   GFRNONAA >60 01/28/2019    Hepatic Lab Results  Component Value Date   AST 75 (H) 11/28/2018   ALT 20 11/28/2018   ALBUMIN 3.9 11/28/2018   ALKPHOS 134 (H) 11/28/2018   HCVAB <0.1 07/24/2018   LIPASE 37 08/21/2016    Electrolytes Lab Results  Component Value Date   NA 140 01/28/2019   K 4.0 01/28/2019   CL 105 01/28/2019   CALCIUM 8.9 01/28/2019    Bone No results found for: VD25OH, CV893YB0FBP, ZW2585ID7, OE4235TI1, 25OHVITD1, 25OHVITD2, 25OHVITD3, TESTOFREE, TESTOSTERONE  Inflammation (CRP: Acute Phase) (ESR: Chronic Phase) Lab Results  Component Value Date   LATICACIDVEN 1.1 12/08/2014       Note: Above Lab results reviewed.  Recent Imaging Review  DG Toe 2nd Left CLINICAL DATA:  Second digit laceration.  Pain.  EXAM: LEFT SECOND TOE  COMPARISON:  No prior.  FINDINGS: Soft tissue swelling. No radiopaque foreign body. Small fracture fragment the base of the distal phalanx of the left second digit noted.  IMPRESSION: 1. Soft tissue swelling. No radiopaque foreign body. 2. Small fracture fragment noted at the base of the distal phalanx of the left second digit.  Electronically Signed   By: Marcello Moores  Register   On: 03/10/2020 08:25 Note: Reviewed        Physical Exam  General appearance: Well nourished, well developed, and well hydrated. In no apparent acute distress Mental status: Alert, oriented x 3  (person, place, & time)       Respiratory: No evidence of acute respiratory distress Eyes: PERLA Vitals: BP 115/60   Pulse 76   Temp (!) 97.1 F (36.2 C)   Ht 6' (1.829 m)   Wt 190 lb (86.2 kg)   SpO2 97%   BMI 25.77 kg/m  BMI: Estimated body mass index is 25.77 kg/m as calculated from the following:   Height as of this encounter: 6' (1.829 m).   Weight as of this encounter: 190 lb (86.2 kg). Ideal: Ideal body weight: 77.6 kg (171 lb 1.2 oz) Adjusted ideal body weight: 81 kg (178 lb 10.3 oz)  Assessment   Status Diagnosis  Controlled Controlled Controlled 1. Chronic pain syndrome   2. Failed back surgical syndrome   3. Chronic lower extremity pain (1ry area of Pain) (Left)   4. Chronic low back pain (2ry area of Pain) (Bilateral) (ML) (L>R) w/ sciatica (Left)   5. Lumbosacral radiculopathy at L5 (Left)   6. DDD (degenerative disc disease), lumbar   7. Pharmacologic therapy   8. Chronic use of opiate for therapeutic purpose   9. Encounter for medication management      Updated Problems: No problems updated.   Plan  of Care  Problem-specific:  No problem-specific Assessment & Plan notes found for this encounter.  Chase Mendez has a current medication list which includes the following long-term medication(s): allopurinol, amlodipine, isosorbide mononitrate, levothyroxine, linaclotide, losartan, oxycodone hcl, [START ON 09/17/2020] oxycodone hcl, [START ON 10/17/2020] oxycodone hcl, testosterone, gabapentin, pantoprazole, and [DISCONTINUED] diphenhydramine.  Pharmacotherapy (Medications Ordered): Meds ordered this encounter  Medications   Oxycodone HCl 10 MG TABS    Sig: Take 1 tablet (10 mg total) by mouth every 6 (six) hours as needed. Must last 30 days.    Dispense:  120 tablet    Refill:  0    Not a duplicate. Do NOT delete! Dispense 1 day early if closed on fill date. Warn not to take CNS-depressants 8 hours before or after taking opioid. Do not send refill  request. Renewal requires appointment.   Oxycodone HCl 10 MG TABS    Sig: Take 1 tablet (10 mg total) by mouth every 6 (six) hours as needed. Must last 30 days.    Dispense:  120 tablet    Refill:  0    Not a duplicate. Do NOT delete! Dispense 1 day early if closed on fill date. Warn not to take CNS-depressants 8 hours before or after taking opioid. Do not send refill request. Renewal requires appointment.   Oxycodone HCl 10 MG TABS    Sig: Take 1 tablet (10 mg total) by mouth every 6 (six) hours as needed. Must last 30 days.    Dispense:  120 tablet    Refill:  0    Not a duplicate. Do NOT delete! Dispense 1 day early if closed on fill date. Warn not to take CNS-depressants 8 hours before or after taking opioid. Do not send refill request. Renewal requires appointment.   Orders:  Orders Placed This Encounter  Procedures   [employer] Select 13 (MW), Urine    Volume: 30 ml(s). Minimum 3 ml of urine is needed. Document temperature of fresh sample. Indications: Long term (current) use of opiate analgesic (B35.329)    Order Specific Question:   Release to patient    Answer:   Immediate   Follow-up plan:   Return in about 3 months (around 11/16/2020) for evaluation day (F2F) (MM).     Interventional treatment options:  Under consideration:   Diagnostic Caudal ESI + epidurogram #1  Possible Racz procedure  Diagnostic bilateral lumbar facet block  Possible bilateral lumbar facet RFA  Possible candidate for intrathecal pump trial and implant    Therapeutic/palliative (PRN):   Therapeutic/palliative left L4 TFESI #2  Therapeutic/palliative left L5 TFESI #2  Bilateral spinal cord stimulator trial (done - 01/17/2018)  Permanent bilateral spinal cord stimulator implant by Dr. Cari Caraway (neurosurgery) (done - 03/13/2018)     Recent Visits No visits were found meeting these conditions. Showing recent visits within past 90 days and meeting all other requirements Today's Visits Date Type  Provider Dept  08/18/20 Office Visit Milinda Pointer, MD [facility]  Showing today's visits and meeting all other requirements Future Appointments Date Type Provider Dept  11/10/20 Appointment Milinda Pointer, MD [facility]  Showing future appointments within next 90 days and meeting all other requirements I discussed the assessment and treatment plan with the patient. The patient was provided an opportunity to ask questions and all were answered. The patient agreed with the plan and demonstrated an understanding of the instructions.  Patient advised to call back or seek an in-person evaluation if the symptoms  or condition worsens.  Duration of encounter: 30 minutes.  Note by: Gaspar Cola, MD Date: 08/18/2020; Time: 3:14 PM

## 2020-08-18 ENCOUNTER — Other Ambulatory Visit: Payer: Self-pay

## 2020-08-18 ENCOUNTER — Encounter: Payer: Self-pay | Admitting: Pain Medicine

## 2020-08-18 ENCOUNTER — Ambulatory Visit: Payer: Worker's Compensation | Attending: Pain Medicine | Admitting: Pain Medicine

## 2020-08-18 VITALS — BP 115/60 | HR 76 | Temp 97.1°F | Ht 72.0 in | Wt 190.0 lb

## 2020-08-18 DIAGNOSIS — M5442 Lumbago with sciatica, left side: Secondary | ICD-10-CM | POA: Diagnosis not present

## 2020-08-18 DIAGNOSIS — Z79899 Other long term (current) drug therapy: Secondary | ICD-10-CM | POA: Insufficient documentation

## 2020-08-18 DIAGNOSIS — M79605 Pain in left leg: Secondary | ICD-10-CM | POA: Insufficient documentation

## 2020-08-18 DIAGNOSIS — M961 Postlaminectomy syndrome, not elsewhere classified: Secondary | ICD-10-CM | POA: Insufficient documentation

## 2020-08-18 DIAGNOSIS — M5417 Radiculopathy, lumbosacral region: Secondary | ICD-10-CM | POA: Insufficient documentation

## 2020-08-18 DIAGNOSIS — Z79891 Long term (current) use of opiate analgesic: Secondary | ICD-10-CM | POA: Insufficient documentation

## 2020-08-18 DIAGNOSIS — G894 Chronic pain syndrome: Secondary | ICD-10-CM | POA: Diagnosis not present

## 2020-08-18 DIAGNOSIS — M5136 Other intervertebral disc degeneration, lumbar region: Secondary | ICD-10-CM | POA: Diagnosis present

## 2020-08-18 DIAGNOSIS — G8929 Other chronic pain: Secondary | ICD-10-CM | POA: Insufficient documentation

## 2020-08-18 MED ORDER — OXYCODONE HCL 10 MG PO TABS
10.0000 mg | ORAL_TABLET | Freq: Four times a day (QID) | ORAL | 0 refills | Status: DC | PRN
Start: 2020-10-17 — End: 2020-11-10

## 2020-08-18 MED ORDER — OXYCODONE HCL 10 MG PO TABS: 10.0000 mg | ORAL_TABLET | Freq: Four times a day (QID) | ORAL | 0 refills | Status: DC | PRN

## 2020-08-18 NOTE — Progress Notes (Signed)
Nursing Pain Medication Assessment:  Safety precautions to be maintained throughout the outpatient stay will include: orient to surroundings, keep bed in low position, maintain call bell within reach at all times, provide assistance with transfer out of bed and ambulation.  Medication Inspection Compliance: Pill count conducted under aseptic conditions, in front of the patient. Neither the pills nor the bottle was removed from the patient's sight at any time. Once count was completed pills were immediately returned to the patient in their original bottle.  Medication: Oxycodone IR Pill/Patch Count:  0 of 120 pills remain Pill/Patch Appearance: Markings consistent with prescribed medication Bottle Appearance: Standard pharmacy container. Clearly labeled. Filled Date: 6 / 53 / 2022 Last Medication intake:  Today

## 2020-08-18 NOTE — Patient Instructions (Signed)
____________________________________________________________________________________________  Medication Rules  Purpose: To inform patients, and their family members, of our rules and regulations.  Applies to: All patients receiving prescriptions (written or electronic).  Pharmacy of record: Pharmacy where electronic prescriptions will be sent. If written prescriptions are taken to a different pharmacy, please inform the nursing staff. The pharmacy listed in the electronic medical record should be the one where you would like electronic prescriptions to be sent.  Electronic prescriptions: In compliance with the [place] Opio[place] Misuse Prevention (STOP) Act of 2017 (Session Law 2017-74/H243), effective February 06, 2018, all controlled substances must be electronically prescribed. Calling prescriptions to the pharmacy will cease to exist.  Prescription refills: Only during scheduled appointments. Applies to all prescriptions.  NOTE: The following applies primarily to controlled substances (Opioid* Pain Medications).   Type of encounter (visit): For patients receiving controlled substances, face-to-face visits are required. (Not an option or up to the patient.)  Patient's responsibilities: Pain Pills: Bring all pain pills to every appointment (except for procedure appointments). Pill Bottles: Bring pills in original pharmacy bottle. Always bring the newest bottle. Bring bottle, even if empty. Medication refills: You are responsible for knowing and keeping track of what medications you take and those you need refilled. The day before your appointment: write a list of all prescriptions that need to be refilled. The day of the appointment: give the list to the admitting nurse. Prescriptions will be written only during appointments. No prescriptions will be written on procedure days. If you forget a medication: it will not be "Called in", "Faxed", or "electronically sent". You will  need to get another appointment to get these prescribed. No early refills. Do not call asking to have your prescription filled early. Prescription Accuracy: You are responsible for carefully inspecting your prescriptions before leaving our office. Have the discharge nurse carefully go over each prescription with you, before taking them home. Make sure that your name is accurately spelled, that your address is correct. Check the name and dose of your medication to make sure it is accurate. Check the number of pills, and the written instructions to make sure they are clear and accurate. Make sure that you are given enough medication to last until your next medication refill appointment. Taking Medication: Take medication as prescribed. When it comes to controlled substances, taking less pills or less frequently than prescribed is permitted and encouraged. Never take more pills than instructed. Never take medication more frequently than prescribed.  Inform other Doctors: Always inform, all of your healthcare providers, of all the medications you take. Pain Medication from other Providers: You are not allowed to accept any additional pain medication from any other Doctor or Healthcare provider. There are two exceptions to this rule. (see below) In the event that you require additional pain medication, you are responsible for notifying us, as stated below. Cough Medicine: Often these contain an opioid, such as codeine or hydrocodone. Never accept or take cough medicine containing these opioids if you are already taking an opioid* medication. The combination may cause respiratory failure and death. Medication Agreement: You are responsible for carefully reading and following our Medication Agreement. This must be signed before receiving any prescriptions from our practice. Safely store a copy of your signed Agreement. Violations to the Agreement will result in no further prescriptions. (Additional copies of our  Medication Agreement are available upon request.) Laws, Rules, & Regulations: All patients are expected to follow all Federal and State Laws, Statutes, Rules, & Regulations. Ignorance of   the Laws does not constitute a valid excuse.  Illegal drugs and Controlled Substances: The use of illegal substances (including, but not limited to marijuana and its derivatives) and/or the illegal use of any controlled substances is strictly prohibited. Violation of this rule may result in the immediate and permanent discontinuation of any and all prescriptions being written by our practice. The use of any illegal substances is prohibited. Adopted CDC guidelines & recommendations: Target dosing levels will be at or below 60 MME/day. Use of benzodiazepines** is not recommended.  Exceptions: There are only two exceptions to the rule of not receiving pain medications from other Healthcare Providers. Exception #1 (Emergencies): In the event of an emergency (i.e.: accident requiring emergency care), you are allowed to receive additional pain medication. However, you are responsible for: As soon as you are able, call our office [phone], at any time of the day or night, and leave a message stating your name, the date and nature of the emergency, and the name and dose of the medication prescribed. In the event that your call is answered by a member of our staff, make sure to document and save the date, time, and the name of the person that took your information.  Exception #2 (Planned Surgery): In the event that you are scheduled by another doctor or dentist to have any type of surgery or procedure, you are allowed (for a period no longer than 30 days), to receive additional pain medication, for the acute post-op pain. However, in this case, you are responsible for picking up a copy of our "Post-op Pain Management for Surgeons" handout, and giving it to your surgeon or dentist. This document is available at our office, and  does not require an appointment to obtain it. Simply go to our office during business hours (Monday-Thursday from 8:00 AM to 4:00 PM) (Friday 8:00 AM to 12:00 Noon) or if you have a scheduled appointment with us, prior to your surgery, and ask for it by name. In addition, you are responsible for: calling our office [phone], at any time of the day or night, and leaving a message stating your name, name of your surgeon, type of surgery, and date of procedure or surgery. Failure to comply with your responsibilities may result in termination of therapy involving the controlled substances.  *Opioid medications include: morphine, codeine, oxycodone, oxymorphone, hydrocodone, hydromorphone, meperidine, tramadol, tapentadol, buprenorphine, fentanyl, methadone. **Benzodiazepine medications include: diazepam (Valium), alprazolam (Xanax), clonazepam (Klonopine), lorazepam (Ativan), clorazepate (Tranxene), chlordiazepoxide (Librium), estazolam (Prosom), oxazepam (Serax), temazepam (Restoril), triazolam (Halcion) (Last updated: 01/05/2020) ____________________________________________________________________________________________  ____________________________________________________________________________________________  Medication Recommendations and Reminders  Applies to: All patients receiving prescriptions (written and/or electronic).  Medication Rules & Regulations: These rules and regulations exist for your safety and that of others. They are not flexible and neither are we. Dismissing or ignoring them will be considered "non-compliance" with medication therapy, resulting in complete and irreversible termination of such therapy. (See document titled "Medication Rules" for more details.) In all conscience, because of safety reasons, we cannot continue providing a therapy where the patient does not follow instructions.  Pharmacy of record:  Definition: This is the pharmacy where your electronic  prescriptions will be sent.  We do not endorse any particular pharmacy, however, we have experienced problems with [facility] not securing enough medication supply for the community. We do not restrict you in your choice of pharmacy. However, once we write for your prescriptions, we will NOT be re-sending more prescriptions to fix restricted supply problems   created by your pharmacy, or your insurance.  The pharmacy listed in the electronic medical record should be the one where you want electronic prescriptions to be sent. If you choose to change pharmacy, simply notify our nursing staff.  Recommendations: Keep all of your pain medications in a safe place, under lock and key, even if you live alone. We will NOT replace lost, stolen, or damaged medication. After you fill your prescription, take 1 week's worth of pills and put them away in a safe place. You should keep a separate, properly labeled bottle for this purpose. The remainder should be kept in the original bottle. Use this as your primary supply, until it runs out. Once it's gone, then you know that you have 1 week's worth of medicine, and it is time to come in for a prescription refill. If you do this correctly, it is unlikely that you will ever run out of medicine. To make sure that the above recommendation works, it is very important that you make sure your medication refill appointments are scheduled at least 1 week before you run out of medicine. To do this in an effective manner, make sure that you do not leave the office without scheduling your next medication management appointment. Always ask the nursing staff to show you in your prescription , when your medication will be running out. Then arrange for the receptionist to get you a return appointment, at least 7 days before you run out of medicine. Do not wait until you have 1 or 2 pills left, to come in. This is very poor planning and does not take into consideration that we may need to  cancel appointments due to bad weather, sickness, or emergencies affecting our staff. DO NOT ACCEPT A "Partial Fill": If for any reason your pharmacy does not have enough pills/tablets to completely fill or refill your prescription, do not allow for a "partial fill". The law allows the pharmacy to complete that prescription within 72 hours, without requiring a new prescription. If they do not fill the rest of your prescription within those 72 hours, you will need a separate prescription to fill the remaining amount, which we will NOT provide. If the reason for the partial fill is your insurance, you will need to talk to the pharmacist about payment alternatives for the remaining tablets, but again, DO NOT ACCEPT A PARTIAL FILL, unless you can trust your pharmacist to obtain the remainder of the pills within 72 hours.  Prescription refills and/or changes in medication(s):  Prescription refills, and/or changes in dose or medication, will be conducted only during scheduled medication management appointments. (Applies to both, written and electronic prescriptions.) No refills on procedure days. No medication will be changed or started on procedure days. No changes, adjustments, and/or refills will be conducted on a procedure day. Doing so will interfere with the diagnostic portion of the procedure. No phone refills. No medications will be "called into the pharmacy". No Fax refills. No weekend refills. No Holliday refills. No after hours refills.  Remember:  Business hours are:  Monday to Thursday 8:00 AM to 4:00 PM Provider's Schedule: Delmas Faucett, MD - Appointments are:  Medication management: Monday and Wednesday 8:00 AM to 4:00 PM Procedure day: Tuesday and Thursday 7:30 AM to 4:00 PM Bilal Lateef, MD - Appointments are:  Medication management: Tuesday and Thursday 8:00 AM to 4:00 PM Procedure day: Monday and Wednesday 7:30 AM to 4:00 PM (Last update:  08/27/2019) ____________________________________________________________________________________________  ____________________________________________________________________________________________  CBD (cannabidiol) WARNING    Applicable to: All individuals currently taking or considering taking CBD (cannabidiol) and, more important, all patients taking opioid analgesic controlled substances (pain medication). (Example: oxycodone; oxymorphone; hydrocodone; hydromorphone; morphine; methadone; tramadol; tapentadol; fentanyl; buprenorphine; butorphanol; dextromethorphan; meperidine; codeine; etc.)  Legal status: CBD remains a Schedule I drug prohibited for any use. CBD is illegal with one exception. In the [place], CBD has a limited Food and Drug Administration (FDA) approval for the treatment of two specific types of epilepsy disorders. Only one CBD product has been approved by the FDA for this purpose: "Epidiolex". FDA is aware that some companies are marketing products containing cannabis and cannabis-derived compounds in ways that violate the Federal Food, Drug and Cosmetic Act (FD&C Act) and that may put the health and safety of consumers at risk. The FDA, a Federal agency, has not enforced the CBD status since 2018.   Legality: Some manufacturers ship CBD products nationally, which is illegal. Often such products are sold online and are therefore available throughout the country. CBD is openly sold in head shops and health food stores in some states where such sales have not been explicitly legalized. Selling unapproved products with unsubstantiated therapeutic claims is not only a violation of the law, but also can put patients at risk, as these products have not been proven to be safe or effective. Federal illegality makes it difficult to conduct research on CBD.  Reference: "FDA Regulation of Cannabis and Cannabis-Derived Products, Including Cannabidiol (CBD)" -  https://www.fda.gov/news-events/public-health-focus/fda-regulation-cannabis-and-cannabis-derived-products-including-cannabidiol-cbd  Warning: CBD is not FDA approved and has not undergo the same manufacturing controls as prescription drugs.  This means that the purity and safety of available CBD may be questionable. Most of the time, despite manufacturer's claims, it is contaminated with THC (delta-9-tetrahydrocannabinol - the chemical in marijuana responsible for the "HIGH").  When this is the case, the THC contaminant will trigger a positive urine drug screen (UDS) test for Marijuana (carboxy-THC). Because a positive UDS for any illicit substance is a violation of our medication agreement, your opioid analgesics (pain medicine) may be permanently discontinued.  MORE ABOUT CBD  General Information: CBD  is a derivative of the Marijuana (cannabis sativa) plant discovered in 1940. It is one of the 113 identified substances found in Marijuana. It accounts for up to 40% of the plant's extract. As of 2018, preliminary clinical studies on CBD included research for the treatment of anxiety, movement disorders, and pain. CBD is available and consumed in multiple forms, including inhalation of smoke or vapor, as an aerosol spray, and by mouth. It may be supplied as an oil containing CBD, capsules, dried cannabis, or as a liquid solution. CBD is thought not to be as psychoactive as THC (delta-9-tetrahydrocannabinol - the chemical in marijuana responsible for the "HIGH"). Studies suggest that CBD may interact with different biological target receptors in the body, including cannabinoid and other neurotransmitter receptors. As of 2018 the mechanism of action for its biological effects has not been determined.  Side-effects  Adverse reactions: Dry mouth, diarrhea, decreased appetite, fatigue, drowsiness, malaise, weakness, sleep disturbances, and others.  Drug interactions: CBC may interact with other medications  such as blood-thinners. (Last update: 09/13/2019) ____________________________________________________________________________________________  ____________________________________________________________________________________________  Drug Holidays (Slow)  What is a "Drug Holiday"? Drug Holiday: is the name given to the period of time during which a patient stops taking a medication(s) for the purpose of eliminating tolerance to the drug.  Benefits Improved effectiveness of opioids. Decreased opioid dose needed to achieve benefits. Improved pain with lesser dose.    What is tolerance? Tolerance: is the progressive decreased in effectiveness of a drug due to its repetitive use. With repetitive use, the body gets use to the medication and as a consequence, it loses its effectiveness. This is a common problem seen with opioid pain medications. As a result, a larger dose of the drug is needed to achieve the same effect that used to be obtained with a smaller dose.  How long should a "Drug Holiday" last? You should stay off of the pain medicine for at least 14 consecutive days. (2 weeks)  Should I stop the medicine "cold turkey"? No. You should always coordinate with your Pain Specialist so that he/she can provide you with the correct medication dose to make the transition as smoothly as possible.  How do I stop the medicine? Slowly. You will be instructed to decrease the daily amount of pills that you take by one (1) pill every seven (7) days. This is called a "slow downward taper" of your dose. For example: if you normally take four (4) pills per day, you will be asked to drop this dose to three (3) pills per day for seven (7) days, then to two (2) pills per day for seven (7) days, then to one (1) per day for seven (7) days, and at the end of those last seven (7) days, this is when the "Drug Holiday" would start.   Will I have withdrawals? By doing a "slow downward taper" like this one, it  is unlikely that you will experience any significant withdrawal symptoms. Typically, what triggers withdrawals is the sudden stop of a high dose opioid therapy. Withdrawals can usually be avoided by slowly decreasing the dose over a prolonged period of time. If you do not follow these instructions and decide to stop your medication abruptly, withdrawals may be possible.  What are withdrawals? Withdrawals: refers to the wide range of symptoms that occur after stopping or dramatically reducing opiate drugs after heavy and prolonged use. Withdrawal symptoms do not occur to patients that use low dose opioids, or those who take the medication sporadically. Contrary to benzodiazepine (example: Valium, Xanax, etc.) or alcohol withdrawals ("Delirium Tremens"), opioid withdrawals are not lethal. Withdrawals are the physical manifestation of the body getting rid of the excess receptors.  Expected Symptoms Early symptoms of withdrawal may include: Agitation Anxiety Muscle aches Increased tearing Insomnia Runny nose Sweating Yawning  Late symptoms of withdrawal may include: Abdominal cramping Diarrhea Dilated pupils Goose bumps Nausea Vomiting  Will I experience withdrawals? Due to the slow nature of the taper, it is very unlikely that you will experience any.  What is a slow taper? Taper: refers to the gradual decrease in dose.  (Last update: 08/27/2019) ____________________________________________________________________________________________    

## 2020-08-26 LAB — TOXASSURE SELECT 13 (MW), URINE

## 2020-08-27 DIAGNOSIS — B351 Tinea unguium: Secondary | ICD-10-CM | POA: Diagnosis not present

## 2020-08-27 DIAGNOSIS — M2041 Other hammer toe(s) (acquired), right foot: Secondary | ICD-10-CM | POA: Diagnosis not present

## 2020-08-27 DIAGNOSIS — M2042 Other hammer toe(s) (acquired), left foot: Secondary | ICD-10-CM | POA: Diagnosis not present

## 2020-08-27 DIAGNOSIS — L600 Ingrowing nail: Secondary | ICD-10-CM | POA: Diagnosis not present

## 2020-08-27 DIAGNOSIS — G6289 Other specified polyneuropathies: Secondary | ICD-10-CM | POA: Diagnosis not present

## 2020-09-24 DIAGNOSIS — R0602 Shortness of breath: Secondary | ICD-10-CM | POA: Diagnosis not present

## 2020-09-24 DIAGNOSIS — I2510 Atherosclerotic heart disease of native coronary artery without angina pectoris: Secondary | ICD-10-CM | POA: Diagnosis not present

## 2020-09-24 DIAGNOSIS — K219 Gastro-esophageal reflux disease without esophagitis: Secondary | ICD-10-CM | POA: Diagnosis not present

## 2020-09-24 DIAGNOSIS — I10 Essential (primary) hypertension: Secondary | ICD-10-CM | POA: Diagnosis not present

## 2020-09-24 DIAGNOSIS — E785 Hyperlipidemia, unspecified: Secondary | ICD-10-CM | POA: Diagnosis not present

## 2020-09-24 DIAGNOSIS — R079 Chest pain, unspecified: Secondary | ICD-10-CM | POA: Diagnosis not present

## 2020-10-04 DIAGNOSIS — R072 Precordial pain: Secondary | ICD-10-CM | POA: Diagnosis not present

## 2020-10-07 DIAGNOSIS — K219 Gastro-esophageal reflux disease without esophagitis: Secondary | ICD-10-CM | POA: Diagnosis not present

## 2020-10-07 DIAGNOSIS — E785 Hyperlipidemia, unspecified: Secondary | ICD-10-CM | POA: Diagnosis not present

## 2020-10-07 DIAGNOSIS — R0602 Shortness of breath: Secondary | ICD-10-CM | POA: Diagnosis not present

## 2020-10-07 DIAGNOSIS — I10 Essential (primary) hypertension: Secondary | ICD-10-CM | POA: Diagnosis not present

## 2020-10-07 DIAGNOSIS — I2510 Atherosclerotic heart disease of native coronary artery without angina pectoris: Secondary | ICD-10-CM | POA: Diagnosis not present

## 2020-10-21 DIAGNOSIS — R7303 Prediabetes: Secondary | ICD-10-CM | POA: Diagnosis not present

## 2020-10-21 DIAGNOSIS — M109 Gout, unspecified: Secondary | ICD-10-CM | POA: Diagnosis not present

## 2020-10-21 DIAGNOSIS — E039 Hypothyroidism, unspecified: Secondary | ICD-10-CM | POA: Diagnosis not present

## 2020-10-26 DIAGNOSIS — J209 Acute bronchitis, unspecified: Secondary | ICD-10-CM | POA: Diagnosis not present

## 2020-10-26 DIAGNOSIS — K219 Gastro-esophageal reflux disease without esophagitis: Secondary | ICD-10-CM | POA: Diagnosis not present

## 2020-10-26 DIAGNOSIS — E785 Hyperlipidemia, unspecified: Secondary | ICD-10-CM | POA: Diagnosis not present

## 2020-10-26 DIAGNOSIS — Z0001 Encounter for general adult medical examination with abnormal findings: Secondary | ICD-10-CM | POA: Diagnosis not present

## 2020-10-26 DIAGNOSIS — I2510 Atherosclerotic heart disease of native coronary artery without angina pectoris: Secondary | ICD-10-CM | POA: Diagnosis not present

## 2020-10-26 DIAGNOSIS — E039 Hypothyroidism, unspecified: Secondary | ICD-10-CM | POA: Diagnosis not present

## 2020-10-26 DIAGNOSIS — R7303 Prediabetes: Secondary | ICD-10-CM | POA: Diagnosis not present

## 2020-10-26 DIAGNOSIS — I10 Essential (primary) hypertension: Secondary | ICD-10-CM | POA: Diagnosis not present

## 2020-10-26 DIAGNOSIS — R0602 Shortness of breath: Secondary | ICD-10-CM | POA: Diagnosis not present

## 2020-10-26 DIAGNOSIS — Z1331 Encounter for screening for depression: Secondary | ICD-10-CM | POA: Diagnosis not present

## 2020-11-10 ENCOUNTER — Other Ambulatory Visit: Payer: Self-pay

## 2020-11-10 ENCOUNTER — Encounter: Payer: Self-pay | Admitting: Pain Medicine

## 2020-11-10 ENCOUNTER — Ambulatory Visit: Payer: Worker's Compensation | Attending: Pain Medicine | Admitting: Pain Medicine

## 2020-11-10 VITALS — BP 118/62 | HR 64 | Temp 97.2°F | Resp 18 | Ht 73.0 in | Wt 192.0 lb

## 2020-11-10 DIAGNOSIS — M5417 Radiculopathy, lumbosacral region: Secondary | ICD-10-CM

## 2020-11-10 DIAGNOSIS — G894 Chronic pain syndrome: Secondary | ICD-10-CM | POA: Diagnosis not present

## 2020-11-10 DIAGNOSIS — M5442 Lumbago with sciatica, left side: Secondary | ICD-10-CM | POA: Diagnosis not present

## 2020-11-10 DIAGNOSIS — Z79891 Long term (current) use of opiate analgesic: Secondary | ICD-10-CM | POA: Diagnosis present

## 2020-11-10 DIAGNOSIS — Z79899 Other long term (current) drug therapy: Secondary | ICD-10-CM | POA: Diagnosis present

## 2020-11-10 DIAGNOSIS — G8929 Other chronic pain: Secondary | ICD-10-CM

## 2020-11-10 DIAGNOSIS — M5136 Other intervertebral disc degeneration, lumbar region: Secondary | ICD-10-CM

## 2020-11-10 DIAGNOSIS — M79605 Pain in left leg: Secondary | ICD-10-CM

## 2020-11-10 DIAGNOSIS — M542 Cervicalgia: Secondary | ICD-10-CM | POA: Diagnosis present

## 2020-11-10 DIAGNOSIS — M961 Postlaminectomy syndrome, not elsewhere classified: Secondary | ICD-10-CM

## 2020-11-10 DIAGNOSIS — M47812 Spondylosis without myelopathy or radiculopathy, cervical region: Secondary | ICD-10-CM

## 2020-11-10 MED ORDER — OXYCODONE HCL 10 MG PO TABS: 10.0000 mg | ORAL_TABLET | Freq: Four times a day (QID) | ORAL | 0 refills | Status: DC | PRN

## 2020-11-10 MED ORDER — OXYCODONE HCL 10 MG PO TABS
10.0000 mg | ORAL_TABLET | Freq: Four times a day (QID) | ORAL | 0 refills | Status: DC | PRN
Start: 2020-12-16 — End: 2021-02-07

## 2020-11-10 NOTE — Progress Notes (Signed)
Nursing Pain Medication Assessment:  Safety precautions to be maintained throughout the outpatient stay will include: orient to surroundings, keep bed in low position, maintain call bell within reach at all times, provide assistance with transfer out of bed and ambulation.  Medication Inspection Compliance: Pill count conducted under aseptic conditions, in front of the patient. Neither the pills nor the bottle was removed from the patient's sight at any time. Once count was completed pills were immediately returned to the patient in their original bottle.  Medication: Oxycodone IR Pill/Patch Count:  24 of 120 pills remain Pill/Patch Appearance: Markings consistent with prescribed medication Bottle Appearance: Standard pharmacy container. Clearly labeled. Filled Date: 82 / 10 / 2022 Last Medication intake:  Today

## 2020-11-10 NOTE — Patient Instructions (Signed)

## 2020-11-10 NOTE — Progress Notes (Signed)
PROVIDER NOTE: Information contained herein reflects review and annotations entered in association with encounter. Interpretation of such information and data should be left to medically-trained personnel. Information provided to patient can be located elsewhere in the medical record under "Patient Instructions". Document created using STT-dictation technology, any transcriptional errors that may result from process are unintentional.    Patient: Chase Mendez  Service Category: E/M  Provider: Gaspar Cola, MD  DOB: 1934-06-02  DOS: 11/10/2020  Specialty: Interventional Pain Management  MRN: [id number]  Setting: Ambulatory outpatient  PCP: Jodi Marble, MD  Type: Established Patient    Referring Provider: Jodi Marble, MD  Location: Office  Delivery: Face-to-face     HPI  [name], a 86 y.o. year old male, is here today because of his Chronic pain of left lower extremity [M79.605, G89.29]. [name] primary complain today is Back Pain Last encounter: My last encounter with him was on 08/18/2020. Pertinent problems: Mr. Rosendahl has Spinal accessory neuropathy; Lower limb pain, inferior (L5) (Foot Drop) (Left); Chronic low back pain (2ry area of Pain) (Bilateral) (ML) (L>R) w/ sciatica (Left); Chronic lower extremity pain (1ry area of Pain) (Left); Failed back surgical syndrome; Foot drop (Left); Lumbosacral radiculopathy at L5 (Left); DDD (degenerative disc disease), lumbar; Chronic pain syndrome; Thoracic central spinal stenosis; Chronic musculoskeletal pain; Neurogenic pain; Presence of neurostimulator; Gout of left foot due to renal impairment; and Disturbance of skin sensation on their pertinent problem list. Pain Assessment: Severity of Chronic pain is reported as a 5 /10. Location: Back Lower/left hip down side of leg to the top effects great toe. Onset: More than a month ago. Quality: Aching, Burning, Sharp, Tingling. Timing: Constant. Modifying factor(s): medications,  SCS, rest. Vitals:  height is '6\' 1"'  (1.854 m) and weight is 192 lb (87.1 kg). His temperature is 97.2 F (36.2 C) (abnormal). His blood pressure is 118/62 and his pulse is 64. His respiration is 18 and oxygen saturation is 97%.   Reason for encounter: medication management.   The patient indicates doing well with the current medication regimen. No adverse reactions or side effects reported to the medications.   Today the patient mentioned that he continues to have some discomfort in the neck area but he denies any upper extremity symptoms.  He refers that the pain is in the posterior aspect of the neck, primarily in the midline but it spreads bilaterally with the right side being worse than the left.  The patient also refers cracking and popping when moving the neck around, but denies any significant decreased range of motion.  He does have a CT of the cervical spine available that was ordered by Dr. Duffy Bruce.  The study was done and interpreted on 01/27/2019.  At that time the study was done to evaluate the patient as he had struck his head on a fall.  The study was interpreted as having no acute traumatic injury identified in the cervical spine.  No other major problems were identified.  Today I asked the patient if he wanted me to further evaluate his cervical spine for interventional therapies but he indicated that at this time it is not bad enough to warrant any further treatments.  RTCB: 02/14/2021 Nonopioids transferred 01/12/2020: Gabapentin (Neurontin)  Pharmacotherapy Assessment  Analgesic: Oxycodone IR 10 mg, 1 tab PO q 6 hrs. (40 mg/day of oxycodone) MME/day: 60 mg/day.   Monitoring: [place] PMP: PDMP reviewed during this encounter.       Pharmacotherapy: No  side-effects or adverse reactions reported. Compliance: No problems identified. Effectiveness: Clinically acceptable.  Ignatius Specking, RN  11/10/2020  8:19 AM  Sign when Signing Visit Nursing Pain Medication Assessment:   Safety precautions to be maintained throughout the outpatient stay will include: orient to surroundings, keep bed in low position, maintain call bell within reach at all times, provide assistance with transfer out of bed and ambulation.  Medication Inspection Compliance: Pill count conducted under aseptic conditions, in front of the patient. Neither the pills nor the bottle was removed from the patient's sight at any time. Once count was completed pills were immediately returned to the patient in their original bottle.  Medication: Oxycodone IR Pill/Patch Count:  24 of 120 pills remain Pill/Patch Appearance: Markings consistent with prescribed medication Bottle Appearance: Standard pharmacy container. Clearly labeled. Filled Date: 45 / 10 / 2022 Last Medication intake:  Today    UDS:  Summary  Date Value Ref Range Status  08/18/2020 Note  Final    Comment:    ==================================================================== [employer] (MW) ==================================================================== Test                             Result       Flag       Units  Drug Present and Declared for Prescription Verification   Oxycodone                      2792         EXPECTED   ng/mg creat   Oxymorphone                    1481         EXPECTED   ng/mg creat   Noroxycodone                   >3086        EXPECTED   ng/mg creat   Noroxymorphone                 1474         EXPECTED   ng/mg creat    Sources of oxycodone are scheduled prescription medications.    Oxymorphone, noroxycodone, and noroxymorphone are expected    metabolites of oxycodone. Oxymorphone is also available as a    scheduled prescription medication.  ==================================================================== Test                      Result    Flag   Units      Ref Range   Creatinine              324              mg/dL       >=20 ==================================================================== Declared Medications:  The flagging and interpretation on this report are based on the  following declared medications.  Unexpected results may arise from  inaccuracies in the declared medications.   **Note: The testing scope of this panel includes these medications:   Oxycodone   **Note: The testing scope of this panel does not include the  following reported medications:   Allopurinol  Amlodipine  Aspirin  Atorvastatin  Benzonatate (Tessalon)  Cyanocobalamin  Diphenhydramine (Benadryl)  Duloxetine (Cymbalta)  Fluticasone (Flonase)  Gabapentin  Isosorbide (Imdur)  Levothyroxine  Linaclotide (Linzess)  Losartan  Naloxone (Narcan)  Ondansetron  Pantoprazole  Tamsulosin (Flomax)  Testosterone  Vitamin E ==================================================================== For clinical consultation, please call [phone]. ====================================================================      ROS  Constitutional: Denies any fever or chills Gastrointestinal: No reported hemesis, hematochezia, vomiting, or acute GI distress Musculoskeletal: Denies any acute onset joint swelling, redness, loss of ROM, or weakness Neurological: No reported episodes of acute onset apraxia, aphasia, dysarthria, agnosia, amnesia, paralysis, loss of coordination, or loss of consciousness  Medication Review  DULoxetine, Oxycodone HCl, Testosterone, allopurinol, amLODipine, aspirin, atorvastatin, benzonatate, diphenhydrAMINE, gabapentin, isosorbide mononitrate, levothyroxine, linaclotide, losartan, naloxone, ondansetron, pantoprazole, tamsulosin, vitamin B-12, and vitamin E  History Review  Allergy: Mr. Imran is allergic to doxycycline, ibuprofen, and sulfa antibiotics. Drug: Mr. Ferg  reports current drug use. Drug: Oxycodone. Alcohol:  reports no history of alcohol use. Tobacco:  reports that he has been  smoking cigars and cigarettes. He has a 35.00 pack-year smoking history. He has never used smokeless tobacco. Social: Mr. Main  reports that he has been smoking cigars and cigarettes. He has a 35.00 pack-year smoking history. He has never used smokeless tobacco. He reports current drug use. Drug: Oxycodone. He reports that he does not drink alcohol. Medical:  has a past medical history of Acute encephalopathy (12/08/2014), Anxiety, ARF (acute renal failure) ([place]) (12/08/2014), Back pain, Benign neoplasm of large bowel, Capsulitis, Chronic back pain, Coronavirus infection, Depression, Diabetes mellitus without complication ([place]), Dysphagia, Exostosis, Foot drop, left, Frequent falls (02/2018), GERD (gastroesophageal reflux disease), Gout, Hypertension, Hypothyroidism, Insomnia, Low testosterone, Microscopic hematuria (2016), Myocardial infarction ([place]), Pneumonia (12/07/2014), Pressure ulcer (12/09/2014), Sepsis ([place]) (12/08/2014), and Thyroid disease. Surgical: Mr. Olano  has a past surgical history that includes Cholecystectomy; Esophagogastroduodenoscopy (egd) with propofol (N/A, 04/24/2017); Back surgery (1995, 1996); Eye surgery (Bilateral, 1983, 1985); Appendectomy; and Spinal cord stimulator insertion (N/A, 03/13/2018). Family: family history includes Diabetes in his father; Heart disease in his mother; Kidney cancer in his brother.  Laboratory Chemistry Profile   Renal Lab Results  Component Value Date   BUN 17 01/28/2019   CREATININE 1.30 (H) 12/29/2019   GFRAA >60 01/28/2019   GFRNONAA >60 01/28/2019    Hepatic Lab Results  Component Value Date   AST 75 (H) 11/28/2018   ALT 20 11/28/2018   ALBUMIN 3.9 11/28/2018   ALKPHOS 134 (H) 11/28/2018   HCVAB <0.1 07/24/2018   LIPASE 37 08/21/2016    Electrolytes Lab Results  Component Value Date   NA 140 01/28/2019   K 4.0 01/28/2019   CL 105 01/28/2019   CALCIUM 8.9 01/28/2019    Bone No results found for: VD25OH, [id number],  VH8469GE9, BM8413KG4, 25OHVITD1, 25OHVITD2, 25OHVITD3, TESTOFREE, TESTOSTERONE  Inflammation (CRP: Acute Phase) (ESR: Chronic Phase) Lab Results  Component Value Date   LATICACIDVEN 1.1 12/08/2014         Note: Above Lab results reviewed.  Recent Imaging Review  DG Toe 2nd Left CLINICAL DATA:  Second digit laceration.  Pain.  EXAM: LEFT SECOND TOE  COMPARISON:  No prior.  FINDINGS: Soft tissue swelling. No radiopaque foreign body. Small fracture fragment the base of the distal phalanx of the left second digit noted.  IMPRESSION: 1. Soft tissue swelling. No radiopaque foreign body. 2. Small fracture fragment noted at the base of the distal phalanx of the left second digit.  Electronically Signed   By: Marcello Moores  Register   On: 03/10/2020 08:25 Note: Reviewed        Physical Exam  General appearance: Well nourished, well developed, and well hydrated. In no apparent acute distress Mental status: Alert, oriented x 3 (  person, place, & time)       Respiratory: No evidence of acute respiratory distress Eyes: PERLA Vitals: BP 118/62   Pulse 64   Temp (!) 97.2 F (36.2 C)   Resp 18   Ht '6\' 1"'  (1.854 m)   Wt 192 lb (87.1 kg)   SpO2 97%   BMI 25.33 kg/m  BMI: Estimated body mass index is 25.33 kg/m as calculated from the following:   Height as of this encounter: '6\' 1"'  (1.854 m).   Weight as of this encounter: 192 lb (87.1 kg). Ideal: Ideal body weight: 79.9 kg (176 lb 2.4 oz) Adjusted ideal body weight: 82.8 kg (182 lb 7.8 oz)  Assessment   Status Diagnosis  Controlled Controlled Controlled 1. Chronic lower extremity pain (1ry area of Pain) (Left)   2. Chronic low back pain (2ry area of Pain) (Bilateral) (ML) (L>R) w/ sciatica (Left)   3. Failed back surgical syndrome   4. Chronic pain syndrome   5. Pharmacologic therapy   6. Chronic use of opiate for therapeutic purpose   7. Encounter for medication management   8. Lumbosacral radiculopathy at L5 (Left)   9.  DDD (degenerative disc disease), lumbar   10. Cervicalgia   11. Cervical facet syndrome      Updated Problems: Problem  Cervicalgia  Cervical Facet Syndrome    Plan of Care  Problem-specific:  No problem-specific Assessment & Plan notes found for this encounter.  Mr. CHAEL URENDA has a current medication list which includes the following long-term medication(s): allopurinol, amlodipine, isosorbide mononitrate, levothyroxine, linaclotide, losartan, [START ON 11/16/2020] oxycodone hcl, [START ON 12/16/2020] oxycodone hcl, [START ON 01/15/2021] oxycodone hcl, testosterone, gabapentin, pantoprazole, and [DISCONTINUED] diphenhydramine.  Pharmacotherapy (Medications Ordered): Meds ordered this encounter  Medications   Oxycodone HCl 10 MG TABS    Sig: Take 1 tablet (10 mg total) by mouth every 6 (six) hours as needed. Must last 30 days.    Dispense:  120 tablet    Refill:  0    Not a duplicate. Do NOT delete! Dispense 1 day early if closed on fill date. Warn: no CNS-depressants within 8 hrs of opioid. Do not send refill request. Renewal requires appointment. No partial fills allowed   Oxycodone HCl 10 MG TABS    Sig: Take 1 tablet (10 mg total) by mouth every 6 (six) hours as needed. Must last 30 days.    Dispense:  120 tablet    Refill:  0    Not a duplicate. Do NOT delete! Dispense 1 day early if closed on fill date. Warn: no CNS-depressants within 8 hrs of opioid. Do not send refill request. Renewal requires appointment. No partial fills allowed   Oxycodone HCl 10 MG TABS    Sig: Take 1 tablet (10 mg total) by mouth every 6 (six) hours as needed. Must last 30 days.    Dispense:  120 tablet    Refill:  0    Not a duplicate. Do NOT delete! Dispense 1 day early if closed on fill date. Warn: no CNS-depressants within 8 hrs of opioid. Do not send refill request. Renewal requires appointment. No partial fills allowed    Orders:  No orders of the defined types were placed in this  encounter.  Follow-up plan:   Return in about 3 months (around 02/14/2021) for Eval-day (M,W), (F2F), (MM).     Interventional treatment options:  Under consideration:   Diagnostic Caudal ESI + epidurogram #1  Possible Racz procedure  Diagnostic bilateral lumbar facet block  Possible bilateral lumbar facet RFA  Possible candidate for intrathecal pump trial and implant    Therapeutic/palliative (PRN):   Therapeutic/palliative left L4 TFESI #2  Therapeutic/palliative left L5 TFESI #2  Bilateral spinal cord stimulator trial (done - 01/17/2018)  Permanent bilateral spinal cord stimulator implant by Dr. Cari Caraway (neurosurgery) (done - 03/13/2018)      Recent Visits Date Type Provider Dept  08/18/20 Office Visit Milinda Pointer, MD [facility]  Showing recent visits within past 90 days and meeting all other requirements Today's Visits Date Type Provider Dept  11/10/20 Office Visit Milinda Pointer, MD [facility]  Showing today's visits and meeting all other requirements Future Appointments No visits were found meeting these conditions. Showing future appointments within next 90 days and meeting all other requirements I discussed the assessment and treatment plan with the patient. The patient was provided an opportunity to ask questions and all were answered. The patient agreed with the plan and demonstrated an understanding of the instructions.  Patient advised to call back or seek an in-person evaluation if the symptoms or condition worsens.  Duration of encounter: 30 minutes.  Note by: Gaspar Cola, MD Date: 11/10/2020; Time: 8:54 AM

## 2020-12-14 DIAGNOSIS — E039 Hypothyroidism, unspecified: Secondary | ICD-10-CM | POA: Diagnosis not present

## 2020-12-14 DIAGNOSIS — E785 Hyperlipidemia, unspecified: Secondary | ICD-10-CM | POA: Diagnosis not present

## 2020-12-14 DIAGNOSIS — I2510 Atherosclerotic heart disease of native coronary artery without angina pectoris: Secondary | ICD-10-CM | POA: Diagnosis not present

## 2020-12-14 DIAGNOSIS — R7303 Prediabetes: Secondary | ICD-10-CM | POA: Diagnosis not present

## 2020-12-14 DIAGNOSIS — H6123 Impacted cerumen, bilateral: Secondary | ICD-10-CM | POA: Diagnosis not present

## 2020-12-14 DIAGNOSIS — R0602 Shortness of breath: Secondary | ICD-10-CM | POA: Diagnosis not present

## 2020-12-14 DIAGNOSIS — I10 Essential (primary) hypertension: Secondary | ICD-10-CM | POA: Diagnosis not present

## 2020-12-14 DIAGNOSIS — K219 Gastro-esophageal reflux disease without esophagitis: Secondary | ICD-10-CM | POA: Diagnosis not present

## 2020-12-29 DIAGNOSIS — H903 Sensorineural hearing loss, bilateral: Secondary | ICD-10-CM | POA: Diagnosis not present

## 2020-12-29 DIAGNOSIS — H6123 Impacted cerumen, bilateral: Secondary | ICD-10-CM | POA: Diagnosis not present

## 2021-01-06 DIAGNOSIS — E785 Hyperlipidemia, unspecified: Secondary | ICD-10-CM | POA: Diagnosis not present

## 2021-01-06 DIAGNOSIS — I2510 Atherosclerotic heart disease of native coronary artery without angina pectoris: Secondary | ICD-10-CM | POA: Diagnosis not present

## 2021-01-06 DIAGNOSIS — I340 Nonrheumatic mitral (valve) insufficiency: Secondary | ICD-10-CM | POA: Diagnosis not present

## 2021-01-06 DIAGNOSIS — K2100 Gastro-esophageal reflux disease with esophagitis, without bleeding: Secondary | ICD-10-CM | POA: Diagnosis not present

## 2021-01-06 DIAGNOSIS — I351 Nonrheumatic aortic (valve) insufficiency: Secondary | ICD-10-CM | POA: Diagnosis not present

## 2021-01-06 DIAGNOSIS — R69 Illness, unspecified: Secondary | ICD-10-CM | POA: Diagnosis not present

## 2021-01-06 DIAGNOSIS — I10 Essential (primary) hypertension: Secondary | ICD-10-CM | POA: Diagnosis not present

## 2021-01-15 ENCOUNTER — Other Ambulatory Visit: Payer: Self-pay | Admitting: Pain Medicine

## 2021-01-15 DIAGNOSIS — M961 Postlaminectomy syndrome, not elsewhere classified: Secondary | ICD-10-CM

## 2021-01-15 DIAGNOSIS — M5417 Radiculopathy, lumbosacral region: Secondary | ICD-10-CM

## 2021-01-15 DIAGNOSIS — Z79891 Long term (current) use of opiate analgesic: Secondary | ICD-10-CM

## 2021-01-15 DIAGNOSIS — M79605 Pain in left leg: Secondary | ICD-10-CM

## 2021-01-15 DIAGNOSIS — Z79899 Other long term (current) drug therapy: Secondary | ICD-10-CM

## 2021-01-15 DIAGNOSIS — G8929 Other chronic pain: Secondary | ICD-10-CM

## 2021-01-15 DIAGNOSIS — M5442 Lumbago with sciatica, left side: Secondary | ICD-10-CM

## 2021-01-15 DIAGNOSIS — M5136 Other intervertebral disc degeneration, lumbar region: Secondary | ICD-10-CM

## 2021-01-15 DIAGNOSIS — G894 Chronic pain syndrome: Secondary | ICD-10-CM

## 2021-01-21 DIAGNOSIS — M109 Gout, unspecified: Secondary | ICD-10-CM | POA: Diagnosis not present

## 2021-01-21 DIAGNOSIS — R7303 Prediabetes: Secondary | ICD-10-CM | POA: Diagnosis not present

## 2021-01-21 DIAGNOSIS — E039 Hypothyroidism, unspecified: Secondary | ICD-10-CM | POA: Diagnosis not present

## 2021-01-25 DIAGNOSIS — K2100 Gastro-esophageal reflux disease with esophagitis, without bleeding: Secondary | ICD-10-CM | POA: Diagnosis not present

## 2021-01-25 DIAGNOSIS — R7303 Prediabetes: Secondary | ICD-10-CM | POA: Diagnosis not present

## 2021-01-25 DIAGNOSIS — M109 Gout, unspecified: Secondary | ICD-10-CM | POA: Diagnosis not present

## 2021-01-25 DIAGNOSIS — E785 Hyperlipidemia, unspecified: Secondary | ICD-10-CM | POA: Diagnosis not present

## 2021-01-25 DIAGNOSIS — R0602 Shortness of breath: Secondary | ICD-10-CM | POA: Diagnosis not present

## 2021-01-25 DIAGNOSIS — E039 Hypothyroidism, unspecified: Secondary | ICD-10-CM | POA: Diagnosis not present

## 2021-01-25 DIAGNOSIS — I2510 Atherosclerotic heart disease of native coronary artery without angina pectoris: Secondary | ICD-10-CM | POA: Diagnosis not present

## 2021-01-25 DIAGNOSIS — I10 Essential (primary) hypertension: Secondary | ICD-10-CM | POA: Diagnosis not present

## 2021-01-25 DIAGNOSIS — J329 Chronic sinusitis, unspecified: Secondary | ICD-10-CM | POA: Diagnosis not present

## 2021-02-02 DIAGNOSIS — E291 Testicular hypofunction: Secondary | ICD-10-CM | POA: Diagnosis not present

## 2021-02-02 DIAGNOSIS — I10 Essential (primary) hypertension: Secondary | ICD-10-CM | POA: Diagnosis not present

## 2021-02-02 DIAGNOSIS — N3281 Overactive bladder: Secondary | ICD-10-CM | POA: Diagnosis not present

## 2021-02-02 DIAGNOSIS — Z79899 Other long term (current) drug therapy: Secondary | ICD-10-CM | POA: Diagnosis not present

## 2021-02-02 DIAGNOSIS — R262 Difficulty in walking, not elsewhere classified: Secondary | ICD-10-CM | POA: Diagnosis not present

## 2021-02-02 DIAGNOSIS — M1711 Unilateral primary osteoarthritis, right knee: Secondary | ICD-10-CM | POA: Diagnosis not present

## 2021-02-02 DIAGNOSIS — E039 Hypothyroidism, unspecified: Secondary | ICD-10-CM | POA: Diagnosis not present

## 2021-02-02 DIAGNOSIS — I350 Nonrheumatic aortic (valve) stenosis: Secondary | ICD-10-CM | POA: Diagnosis not present

## 2021-02-02 DIAGNOSIS — I2510 Atherosclerotic heart disease of native coronary artery without angina pectoris: Secondary | ICD-10-CM | POA: Diagnosis not present

## 2021-02-02 DIAGNOSIS — E785 Hyperlipidemia, unspecified: Secondary | ICD-10-CM | POA: Diagnosis not present

## 2021-02-02 DIAGNOSIS — K219 Gastro-esophageal reflux disease without esophagitis: Secondary | ICD-10-CM | POA: Diagnosis not present

## 2021-02-02 DIAGNOSIS — N401 Enlarged prostate with lower urinary tract symptoms: Secondary | ICD-10-CM | POA: Diagnosis not present

## 2021-02-02 DIAGNOSIS — N5201 Erectile dysfunction due to arterial insufficiency: Secondary | ICD-10-CM | POA: Diagnosis not present

## 2021-02-02 DIAGNOSIS — M1030 Gout due to renal impairment, unspecified site: Secondary | ICD-10-CM | POA: Diagnosis not present

## 2021-02-02 DIAGNOSIS — D696 Thrombocytopenia, unspecified: Secondary | ICD-10-CM | POA: Diagnosis not present

## 2021-02-07 DIAGNOSIS — H3530 Unspecified macular degeneration: Secondary | ICD-10-CM | POA: Diagnosis not present

## 2021-02-07 DIAGNOSIS — R69 Illness, unspecified: Secondary | ICD-10-CM | POA: Diagnosis not present

## 2021-02-07 DIAGNOSIS — E1142 Type 2 diabetes mellitus with diabetic polyneuropathy: Secondary | ICD-10-CM | POA: Diagnosis not present

## 2021-02-07 DIAGNOSIS — E291 Testicular hypofunction: Secondary | ICD-10-CM | POA: Diagnosis not present

## 2021-02-07 DIAGNOSIS — E663 Overweight: Secondary | ICD-10-CM | POA: Diagnosis not present

## 2021-02-07 DIAGNOSIS — E785 Hyperlipidemia, unspecified: Secondary | ICD-10-CM | POA: Diagnosis not present

## 2021-02-07 DIAGNOSIS — I10 Essential (primary) hypertension: Secondary | ICD-10-CM | POA: Diagnosis not present

## 2021-02-07 DIAGNOSIS — G8929 Other chronic pain: Secondary | ICD-10-CM | POA: Diagnosis not present

## 2021-02-07 DIAGNOSIS — I2510 Atherosclerotic heart disease of native coronary artery without angina pectoris: Secondary | ICD-10-CM | POA: Diagnosis not present

## 2021-02-07 DIAGNOSIS — E039 Hypothyroidism, unspecified: Secondary | ICD-10-CM | POA: Diagnosis not present

## 2021-02-07 NOTE — Progress Notes (Signed)
PROVIDER NOTE: Information contained herein reflects review and annotations entered in association with encounter. Interpretation of such information and data should be left to medically-trained personnel. Information provided to patient can be located elsewhere in the medical record under "Patient Instructions". Document created using STT-dictation technology, any transcriptional errors that may result from process are unintentional.    Patient: Chase Mendez  Service Category: E/M  Provider: Gaspar Cola, MD  DOB: Jan 11, 1935  DOS: 02/09/2021  Specialty: Interventional Pain Management  MRN: [id number]  Setting: Ambulatory outpatient  PCP: Chase Marble, MD  Type: Established Patient    Referring Provider: Jodi Marble, MD  Location: Office  Delivery: Face-to-face     HPI  [name], a 87 y.o. year old male, is here today because of his No primary diagnosis found.. Chase Mendez primary complain today is Hip Pain (Left leg) Last encounter: My last encounter with him was on 01/15/2021. Pertinent problems: Chase Mendez has Spinal accessory neuropathy; Lower limb pain, inferior (L5) (Foot Drop) (Left); Chronic low back pain (2ry area of Pain) (Bilateral) (ML) (L>R) w/ sciatica (Left); Chronic lower extremity pain (1ry area of Pain) (Left); Failed back surgical syndrome; Foot drop (Left); Lumbosacral radiculopathy at L5 (Left); DDD (degenerative disc disease), lumbar; Chronic pain syndrome; Thoracic central spinal stenosis; Chronic musculoskeletal pain; Neurogenic pain; Presence of neurostimulator; Gout of left foot due to renal impairment; and Disturbance of skin sensation on their pertinent problem list. Pain Assessment: Severity of Chronic pain is reported as a 5 /10. Location: Hip (left) Lower/pain radiaties down left leg. Onset: More than a month ago. Quality: Aching, Dull. Timing: Intermittent. Modifying factor(s): Meds, stimalator, laying down. Vitals:  height is '5\' 10"'  (1.778 m)  and weight is 192 lb (87.1 kg). His temperature is 97 F (36.1 C) (abnormal). His blood pressure is 133/75 and his pulse is 71. His respiration is 16 and oxygen saturation is 97%.   Reason for encounter: medication management.   The patient indicates doing well with the current medication regimen. No adverse reactions or side effects reported to the medications.   Today, while in the waiting room, the patient fell.  We immediately assisted him in getting up and we have evaluated him for any injuries.  He indicates that his left leg felt weak and did not support him.  This is indeed part of his radicular symptoms, but he also has severe deconditioning.  According to the patient's wife he is scheduled to have physical therapy at home.  Unfortunately she states that he has had physical therapy in multiple locations but refuses to continue doing it whenever the physical therapist is not there.  He has not taken up the responsibility of doing it by himself.  Today I had a long conversation with the patient regarding this and the fact that he needs to incorporate the stretching and strengthening exercises exercises into his daily routine, so as to avoid him becoming dependent on his wife and losing his ability to ambulate.  He says that he will be doing that, but that remains to be seen.  We will continue to monitor him for improvement or progression of deconditioning.  The patient's wife also asked about some nausea that he has been experiencing and she asked if this had to do with his pain medication.  I confirmed with him that pain medication can cause some nausea, especially if he is taking it without any fluid in his stomach.  At this point she turned  to him and told him "I told you so".  This tells me that he is likely not to be eating anything when he takes his pain medicine.  I also asked him if his primary care physician was keeping track of his kidney and liver function and he indicated that he is.  They  indicate that the labs are being done at the PCPs office.  This would explain why there are no recent comprehensive metabolic panel lab work under the [facility] system or [facility].  In any case I have explained to them that should he begin to experience a decrease in his kidney or liver function, these could be reasons for his pain medication accumulating in his system which in turn would definitely cause some issues with nausea and/or sedation.  They deny any oversedation, but they indicate that he is scheduled to see a gastroenterologist for this nausea.  I agree that this is a good idea to have it worked up by an Cytogeneticist.  I have also informed them that sure to be related to the pain medication, we could continue to adjust his dose down.  Does not something that he is interested in doing, but his wife is open to this alternative.  After I completed my evaluation I again asked him if he was okay from the fall and whether or not he needed [place] to do any x-rays or pursue further evaluation of any areas of pain that may be new from that fall.  He indicated that he was okay and he declined.  RTCB: 05/15/2021 Nonopioids transferred 01/12/2020: Gabapentin (Neurontin)  Pharmacotherapy Assessment  Analgesic: Oxycodone IR 10 mg, 1 tab PO q 6 hrs. (40 mg/day of oxycodone) MME/day: 60 mg/day.   Monitoring: [place] PMP: PDMP reviewed during this encounter.       Pharmacotherapy: No side-effects or adverse reactions reported. Compliance: No problems identified. Effectiveness: Clinically acceptable.  Chase Fischer, RN  02/09/2021  8:51 AM  Sign when Signing Visit Nursing Pain Medication Assessment:  Safety precautions to be maintained throughout the outpatient stay will include: orient to surroundings, keep bed in low position, maintain call bell within reach at all times, provide assistance with transfer out of bed and ambulation.  Medication Inspection Compliance: Pill count conducted under aseptic conditions, in  front of the patient. Neither the pills nor the bottle was removed from the patient's sight at any time. Once count was completed pills were immediately returned to the patient in their original bottle.  Medication: Oxycodone IR Pill/Patch Count:  20 of 120 pills remain Pill/Patch Appearance: Markings consistent with prescribed medication Bottle Appearance: Standard pharmacy container. Clearly labeled. Filled Date: 23 / 10 / 2022 Last Medication intake:  TodaySafety precautions to be maintained throughout the outpatient stay will include: orient to surroundings, keep bed in low position, maintain call bell within reach at all times, provide assistance with transfer out of bed and ambulation.     UDS:  Summary  Date Value Ref Range Status  08/18/2020 Note  Final    Comment:    ==================================================================== [facility] (MW) ==================================================================== Test                             Result       Flag       Units  Drug Present and Declared for Prescription Verification   Oxycodone  2792         EXPECTED   ng/mg creat   Oxymorphone                    1481         EXPECTED   ng/mg creat   Noroxycodone                   >3086        EXPECTED   ng/mg creat   Noroxymorphone                 1474         EXPECTED   ng/mg creat    Sources of oxycodone are scheduled prescription medications.    Oxymorphone, noroxycodone, and noroxymorphone are expected    metabolites of oxycodone. Oxymorphone is also available as a    scheduled prescription medication.  ==================================================================== Test                      Result    Flag   Units      Ref Range   Creatinine              324              mg/dL      >=20 ==================================================================== Declared Medications:  The flagging and interpretation on this report are based  on the  following declared medications.  Unexpected results may arise from  inaccuracies in the declared medications.   **Note: The testing scope of this panel includes these medications:   Oxycodone   **Note: The testing scope of this panel does not include the  following reported medications:   Allopurinol  Amlodipine  Aspirin  Atorvastatin  Benzonatate (Tessalon)  Cyanocobalamin  Diphenhydramine (Benadryl)  Duloxetine (Cymbalta)  Fluticasone (Flonase)  Gabapentin  Isosorbide (Imdur)  Levothyroxine  Linaclotide (Linzess)  Losartan  Naloxone (Narcan)  Ondansetron  Pantoprazole  Tamsulosin (Flomax)  Testosterone  Vitamin E ==================================================================== For clinical consultation, please call [phone]. ====================================================================      ROS  Constitutional: Denies any fever or chills Gastrointestinal: No reported hemesis, hematochezia, vomiting, or acute GI distress Musculoskeletal: Denies any acute onset joint swelling, redness, loss of ROM, or weakness Neurological: No reported episodes of acute onset apraxia, aphasia, dysarthria, agnosia, amnesia, paralysis, loss of coordination, or loss of consciousness  Medication Review  DULoxetine, Oxycodone HCl, Testosterone, allopurinol, amLODipine, aspirin, atorvastatin, benzonatate, diphenhydrAMINE, gabapentin, isosorbide mononitrate, levothyroxine, linaclotide, losartan, ondansetron, pantoprazole, tamsulosin, vitamin B-12, and vitamin E  History Review  Allergy: Chase Mendez is allergic to doxycycline, ibuprofen, and sulfa antibiotics. Drug: Chase Mendez  reports current drug use. Drug: Oxycodone. Alcohol:  reports no history of alcohol use. Tobacco:  reports that he has been smoking cigars and cigarettes. He has a 35.00 pack-year smoking history. He has never used smokeless tobacco. Social: Chase Mendez  reports that he has been smoking cigars  and cigarettes. He has a 35.00 pack-year smoking history. He has never used smokeless tobacco. He reports current drug use. Drug: Oxycodone. He reports that he does not drink alcohol. Medical:  has a past medical history of Acute encephalopathy (12/08/2014), Anxiety, ARF (acute renal failure) ([place]) (12/08/2014), Back pain, Benign neoplasm of large bowel, Capsulitis, Chronic back pain, Coronavirus infection, Depression, Diabetes mellitus without complication ([place]), Dysphagia, Exostosis, Foot drop, left, Frequent falls (02/2018), GERD (gastroesophageal reflux disease), Gout, Hypertension, Hypothyroidism, Insomnia, Low testosterone, Microscopic hematuria (2016), Myocardial infarction ([place]), Pneumonia (12/07/2014), Pressure  ulcer (12/09/2014), Sepsis ([place]) (12/08/2014), and Thyroid disease. Surgical: Mr. Fontan  has a past surgical history that includes Cholecystectomy; Esophagogastroduodenoscopy (egd) with propofol (N/A, 04/24/2017); Back surgery (1995, 1996); Eye surgery (Bilateral, 1983, 1985); Appendectomy; and Spinal cord stimulator insertion (N/A, 03/13/2018). Family: family history includes Diabetes in his father; Heart disease in his mother; Kidney cancer in his brother.  Laboratory Chemistry Profile   Renal Lab Results  Component Value Date   BUN 17 01/28/2019   CREATININE 1.30 (H) 12/29/2019   GFRAA >60 01/28/2019   GFRNONAA >60 01/28/2019    Hepatic Lab Results  Component Value Date   AST 75 (H) 11/28/2018   ALT 20 11/28/2018   ALBUMIN 3.9 11/28/2018   ALKPHOS 134 (H) 11/28/2018   HCVAB <0.1 07/24/2018   LIPASE 37 08/21/2016    Electrolytes Lab Results  Component Value Date   NA 140 01/28/2019   K 4.0 01/28/2019   CL 105 01/28/2019   CALCIUM 8.9 01/28/2019    Bone No results found for: VD25OH, XL244WN0UVO, ZD6644IH4, VQ2595GL8, 25OHVITD1, 25OHVITD2, 25OHVITD3, TESTOFREE, TESTOSTERONE  Inflammation (CRP: Acute Phase) (ESR: Chronic Phase) Lab Results  Component Value Date    LATICACIDVEN 1.1 12/08/2014         Note: Above Lab results reviewed.  Recent Imaging Review  DG Toe 2nd Left CLINICAL DATA:  Second digit laceration.  Pain.  EXAM: LEFT SECOND TOE  COMPARISON:  No prior.  FINDINGS: Soft tissue swelling. No radiopaque foreign body. Small fracture fragment the base of the distal phalanx of the left second digit noted.  IMPRESSION: 1. Soft tissue swelling. No radiopaque foreign body. 2. Small fracture fragment noted at the base of the distal phalanx of the left second digit.  Electronically Signed   By: Marcello Moores  Register   On: 03/10/2020 08:25 Note: Reviewed        Physical Exam  General appearance: Well nourished, well developed, and well hydrated. In no apparent acute distress Mental status: Alert, oriented x 3 (person, place, & time)       Respiratory: No evidence of acute respiratory distress Eyes: PERLA Vitals: BP 133/75    Pulse 71    Temp (!) 97 F (36.1 C)    Resp 16    Ht '5\' 10"'  (1.778 m)    Wt 192 lb (87.1 kg)    SpO2 97%    BMI 27.55 kg/m  BMI: Estimated body mass index is 27.55 kg/m as calculated from the following:   Height as of this encounter: '5\' 10"'  (1.778 m).   Weight as of this encounter: 192 lb (87.1 kg). Ideal: Ideal body weight: 73 kg (160 lb 15 oz) Adjusted ideal body weight: 78.6 kg (173 lb 5.8 oz)  Assessment   Status Diagnosis  Controlled Controlled Controlled 1. Chronic lower extremity pain (1ry area of Pain) (Left)   2. Chronic low back pain (2ry area of Pain) (Bilateral) (ML) (L>R) w/ sciatica (Left)   3. Failed back surgical syndrome   4. Chronic pain syndrome   5. Pharmacologic therapy   6. Chronic use of opiate for therapeutic purpose   7. Encounter for medication management   8. Lumbosacral radiculopathy at L5 (Left)   9. DDD (degenerative disc disease), lumbar      Updated Problems: No problems updated.  Plan of Care  Problem-specific:  No problem-specific Assessment & Plan notes found  for this encounter.  Chase Mendez has a current medication list which includes the following long-term medication(s): allopurinol, amlodipine, isosorbide  mononitrate, levothyroxine, losartan, testosterone, gabapentin, linaclotide, [START ON 02/14/2021] oxycodone hcl, [START ON 03/16/2021] oxycodone hcl, [START ON 04/15/2021] oxycodone hcl, pantoprazole, and [DISCONTINUED] diphenhydramine.  Pharmacotherapy (Medications Ordered): Meds ordered this encounter  Medications   Oxycodone HCl 10 MG TABS    Sig: Take 1 tablet (10 mg total) by mouth every 6 (six) hours as needed. Must last 30 days.    Dispense:  120 tablet    Refill:  0    DO NOT: delete (not duplicate); no partial-fill (will deny script to complete), no refill request (F/U required). DISPENSE: 1 day early if closed on fill date. WARN: No CNS-depressants within 8 hrs of med.   Oxycodone HCl 10 MG TABS    Sig: Take 1 tablet (10 mg total) by mouth every 6 (six) hours as needed. Must last 30 days.    Dispense:  120 tablet    Refill:  0    DO NOT: delete (not duplicate); no partial-fill (will deny script to complete), no refill request (F/U required). DISPENSE: 1 day early if closed on fill date. WARN: No CNS-depressants within 8 hrs of med.   Oxycodone HCl 10 MG TABS    Sig: Take 1 tablet (10 mg total) by mouth every 6 (six) hours as needed. Must last 30 days.    Dispense:  120 tablet    Refill:  0    DO NOT: delete (not duplicate); no partial-fill (will deny script to complete), no refill request (F/U required). DISPENSE: 1 day early if closed on fill date. WARN: No CNS-depressants within 8 hrs of med.   Orders:  No orders of the defined types were placed in this encounter.  Follow-up plan:   Return in about 3 months (around 05/15/2021) for Eval-day (M,W), (F2F), (MM).     Interventional treatment options:  Under consideration:   Diagnostic Caudal ESI + epidurogram #1  Possible Racz procedure  Diagnostic bilateral lumbar facet  block  Possible bilateral lumbar facet RFA  Possible candidate for intrathecal pump trial and implant    Therapeutic/palliative (PRN):   Therapeutic/palliative left L4 TFESI #2  Therapeutic/palliative left L5 TFESI #2  Bilateral spinal cord stimulator trial (done - 01/17/2018)  Permanent bilateral spinal cord stimulator implant by Dr. Cari Caraway (neurosurgery) (done - 03/13/2018)    Recent Visits No visits were found meeting these conditions. Showing recent visits within past 90 days and meeting all other requirements Today's Visits Date Type Provider Dept  02/09/21 Office Visit Milinda Pointer, MD [facility]  Showing today's visits and meeting all other requirements Future Appointments Date Type Provider Dept  05/04/21 Appointment Milinda Pointer, MD [facility]  Showing future appointments within next 90 days and meeting all other requirements  I discussed the assessment and treatment plan with the patient. The patient was provided an opportunity to ask questions and all were answered. The patient agreed with the plan and demonstrated an understanding of the instructions.  Patient advised to call back or seek an in-person evaluation if the symptoms or condition worsens.  Duration of encounter: 30 minutes.  Note by: Chase Cola, MD Date: 02/09/2021; Time: 9:18 AM

## 2021-02-09 ENCOUNTER — Ambulatory Visit: Payer: Medicare HMO | Attending: Pain Medicine | Admitting: Pain Medicine

## 2021-02-09 ENCOUNTER — Encounter: Payer: Self-pay | Admitting: Pain Medicine

## 2021-02-09 ENCOUNTER — Other Ambulatory Visit: Payer: Self-pay

## 2021-02-09 DIAGNOSIS — Z79891 Long term (current) use of opiate analgesic: Secondary | ICD-10-CM | POA: Diagnosis not present

## 2021-02-09 DIAGNOSIS — M5417 Radiculopathy, lumbosacral region: Secondary | ICD-10-CM | POA: Insufficient documentation

## 2021-02-09 DIAGNOSIS — M79605 Pain in left leg: Secondary | ICD-10-CM

## 2021-02-09 DIAGNOSIS — G8929 Other chronic pain: Secondary | ICD-10-CM | POA: Diagnosis not present

## 2021-02-09 DIAGNOSIS — M5136 Other intervertebral disc degeneration, lumbar region: Secondary | ICD-10-CM | POA: Insufficient documentation

## 2021-02-09 DIAGNOSIS — M961 Postlaminectomy syndrome, not elsewhere classified: Secondary | ICD-10-CM

## 2021-02-09 DIAGNOSIS — M5442 Lumbago with sciatica, left side: Secondary | ICD-10-CM | POA: Diagnosis not present

## 2021-02-09 DIAGNOSIS — G894 Chronic pain syndrome: Secondary | ICD-10-CM | POA: Diagnosis not present

## 2021-02-09 DIAGNOSIS — Z79899 Other long term (current) drug therapy: Secondary | ICD-10-CM | POA: Diagnosis not present

## 2021-02-09 MED ORDER — OXYCODONE HCL 10 MG PO TABS: 10.0000 mg | ORAL_TABLET | Freq: Four times a day (QID) | ORAL | 0 refills | Status: DC | PRN

## 2021-02-09 NOTE — Progress Notes (Signed)
Nursing Pain Medication Assessment:  Safety precautions to be maintained throughout the outpatient stay will include: orient to surroundings, keep bed in low position, maintain call bell within reach at all times, provide assistance with transfer out of bed and ambulation.  Medication Inspection Compliance: Pill count conducted under aseptic conditions, in front of the patient. Neither the pills nor the bottle was removed from the patient's sight at any time. Once count was completed pills were immediately returned to the patient in their original bottle.  Medication: Oxycodone IR Pill/Patch Count:  20 of 120 pills remain Pill/Patch Appearance: Markings consistent with prescribed medication Bottle Appearance: Standard pharmacy container. Clearly labeled. Filled Date: 21 / 10 / 2022 Last Medication intake:  TodaySafety precautions to be maintained throughout the outpatient stay will include: orient to surroundings, keep bed in low position, maintain call bell within reach at all times, provide assistance with transfer out of bed and ambulation.

## 2021-02-09 NOTE — Patient Instructions (Signed)

## 2021-02-12 ENCOUNTER — Other Ambulatory Visit: Payer: Self-pay | Admitting: Pain Medicine

## 2021-02-12 DIAGNOSIS — G894 Chronic pain syndrome: Secondary | ICD-10-CM

## 2021-02-12 DIAGNOSIS — G8929 Other chronic pain: Secondary | ICD-10-CM

## 2021-02-12 DIAGNOSIS — M5442 Lumbago with sciatica, left side: Secondary | ICD-10-CM

## 2021-02-12 DIAGNOSIS — M961 Postlaminectomy syndrome, not elsewhere classified: Secondary | ICD-10-CM

## 2021-02-12 DIAGNOSIS — M5136 Other intervertebral disc degeneration, lumbar region: Secondary | ICD-10-CM

## 2021-02-12 DIAGNOSIS — Z79891 Long term (current) use of opiate analgesic: Secondary | ICD-10-CM

## 2021-02-12 DIAGNOSIS — M5417 Radiculopathy, lumbosacral region: Secondary | ICD-10-CM

## 2021-02-12 DIAGNOSIS — Z79899 Other long term (current) drug therapy: Secondary | ICD-10-CM

## 2021-02-14 ENCOUNTER — Telehealth: Payer: Self-pay | Admitting: Pain Medicine

## 2021-02-14 ENCOUNTER — Other Ambulatory Visit: Payer: Self-pay | Admitting: Pain Medicine

## 2021-02-14 DIAGNOSIS — G8929 Other chronic pain: Secondary | ICD-10-CM

## 2021-02-14 DIAGNOSIS — M5417 Radiculopathy, lumbosacral region: Secondary | ICD-10-CM

## 2021-02-14 DIAGNOSIS — Z79899 Other long term (current) drug therapy: Secondary | ICD-10-CM

## 2021-02-14 DIAGNOSIS — M5136 Other intervertebral disc degeneration, lumbar region: Secondary | ICD-10-CM

## 2021-02-14 DIAGNOSIS — M51369 Other intervertebral disc degeneration, lumbar region without mention of lumbar back pain or lower extremity pain: Secondary | ICD-10-CM

## 2021-02-14 DIAGNOSIS — M79605 Pain in left leg: Secondary | ICD-10-CM

## 2021-02-14 DIAGNOSIS — G894 Chronic pain syndrome: Secondary | ICD-10-CM

## 2021-02-14 DIAGNOSIS — M961 Postlaminectomy syndrome, not elsewhere classified: Secondary | ICD-10-CM

## 2021-02-14 DIAGNOSIS — Z79891 Long term (current) use of opiate analgesic: Secondary | ICD-10-CM

## 2021-02-14 MED ORDER — OXYCODONE HCL 10 MG PO TABS: 10.0000 mg | ORAL_TABLET | Freq: Four times a day (QID) | ORAL | 0 refills | Status: DC | PRN

## 2021-02-14 NOTE — Telephone Encounter (Signed)
Patient's wife called stating [facility] does not have the scripts sent in by Dr. Dossie Arbour. Please call pharmacy and let patient know when they can pick up. He is out of meds, none to take today.

## 2021-02-14 NOTE — Telephone Encounter (Signed)
Called pharmacy. Script is there. Called patient and informed.

## 2021-02-14 NOTE — Progress Notes (Signed)
Pharmacy again does not have enough medication.  They have requested that we resend the prescription elsewhere.

## 2021-02-15 DIAGNOSIS — H353131 Nonexudative age-related macular degeneration, bilateral, early dry stage: Secondary | ICD-10-CM | POA: Diagnosis not present

## 2021-02-15 DIAGNOSIS — E119 Type 2 diabetes mellitus without complications: Secondary | ICD-10-CM | POA: Diagnosis not present

## 2021-03-14 ENCOUNTER — Telehealth: Payer: Self-pay | Admitting: Pain Medicine

## 2021-03-14 ENCOUNTER — Other Ambulatory Visit: Payer: Self-pay | Admitting: Pain Medicine

## 2021-03-14 ENCOUNTER — Telehealth: Payer: Self-pay | Admitting: *Deleted

## 2021-03-14 DIAGNOSIS — Z79899 Other long term (current) drug therapy: Secondary | ICD-10-CM

## 2021-03-14 DIAGNOSIS — M79605 Pain in left leg: Secondary | ICD-10-CM

## 2021-03-14 DIAGNOSIS — G8929 Other chronic pain: Secondary | ICD-10-CM

## 2021-03-14 DIAGNOSIS — Z79891 Long term (current) use of opiate analgesic: Secondary | ICD-10-CM

## 2021-03-14 DIAGNOSIS — G894 Chronic pain syndrome: Secondary | ICD-10-CM

## 2021-03-14 DIAGNOSIS — M5136 Other intervertebral disc degeneration, lumbar region: Secondary | ICD-10-CM

## 2021-03-14 DIAGNOSIS — M5442 Lumbago with sciatica, left side: Secondary | ICD-10-CM

## 2021-03-14 DIAGNOSIS — M961 Postlaminectomy syndrome, not elsewhere classified: Secondary | ICD-10-CM

## 2021-03-14 DIAGNOSIS — M5417 Radiculopathy, lumbosacral region: Secondary | ICD-10-CM

## 2021-03-14 MED ORDER — OXYCODONE HCL 10 MG PO TABS: 10.0000 mg | ORAL_TABLET | Freq: Four times a day (QID) | ORAL | 0 refills | Status: DC | PRN

## 2021-03-14 NOTE — Telephone Encounter (Signed)
Note placed onto Dr. Adalberto Cole desk.

## 2021-03-14 NOTE — Telephone Encounter (Signed)
Spoke with patient's wife, informed her that Oxycodone scripts have been sent to [facility].

## 2021-03-22 DIAGNOSIS — K219 Gastro-esophageal reflux disease without esophagitis: Secondary | ICD-10-CM | POA: Diagnosis not present

## 2021-03-22 DIAGNOSIS — K760 Fatty (change of) liver, not elsewhere classified: Secondary | ICD-10-CM | POA: Diagnosis not present

## 2021-03-22 DIAGNOSIS — K5909 Other constipation: Secondary | ICD-10-CM | POA: Diagnosis not present

## 2021-03-22 DIAGNOSIS — R1032 Left lower quadrant pain: Secondary | ICD-10-CM | POA: Diagnosis not present

## 2021-03-22 DIAGNOSIS — R1031 Right lower quadrant pain: Secondary | ICD-10-CM | POA: Diagnosis not present

## 2021-03-22 DIAGNOSIS — R110 Nausea: Secondary | ICD-10-CM | POA: Diagnosis not present

## 2021-03-22 DIAGNOSIS — R7309 Other abnormal glucose: Secondary | ICD-10-CM | POA: Diagnosis not present

## 2021-03-24 ENCOUNTER — Other Ambulatory Visit: Payer: Self-pay | Admitting: Nurse Practitioner

## 2021-03-24 DIAGNOSIS — R1031 Right lower quadrant pain: Secondary | ICD-10-CM

## 2021-03-24 DIAGNOSIS — R1032 Left lower quadrant pain: Secondary | ICD-10-CM

## 2021-03-25 DIAGNOSIS — R110 Nausea: Secondary | ICD-10-CM | POA: Diagnosis not present

## 2021-03-25 DIAGNOSIS — R7981 Abnormal blood-gas level: Secondary | ICD-10-CM | POA: Diagnosis not present

## 2021-04-01 DIAGNOSIS — B351 Tinea unguium: Secondary | ICD-10-CM | POA: Diagnosis not present

## 2021-04-01 DIAGNOSIS — L600 Ingrowing nail: Secondary | ICD-10-CM | POA: Diagnosis not present

## 2021-04-01 DIAGNOSIS — M2042 Other hammer toe(s) (acquired), left foot: Secondary | ICD-10-CM | POA: Diagnosis not present

## 2021-04-01 DIAGNOSIS — M2041 Other hammer toe(s) (acquired), right foot: Secondary | ICD-10-CM | POA: Diagnosis not present

## 2021-04-01 DIAGNOSIS — G6289 Other specified polyneuropathies: Secondary | ICD-10-CM | POA: Diagnosis not present

## 2021-04-05 ENCOUNTER — Ambulatory Visit
Admission: RE | Admit: 2021-04-05 | Discharge: 2021-04-05 | Disposition: A | Discharge status: Home / Self Care | Payer: Medicare HMO | Source: Ambulatory Visit | Attending: Nurse Practitioner | Admitting: Nurse Practitioner

## 2021-04-05 ENCOUNTER — Other Ambulatory Visit: Payer: Self-pay

## 2021-04-05 DIAGNOSIS — L570 Actinic keratosis: Secondary | ICD-10-CM | POA: Diagnosis not present

## 2021-04-05 DIAGNOSIS — R1032 Left lower quadrant pain: Secondary | ICD-10-CM | POA: Diagnosis not present

## 2021-04-05 DIAGNOSIS — D225 Melanocytic nevi of trunk: Secondary | ICD-10-CM | POA: Diagnosis not present

## 2021-04-05 DIAGNOSIS — R1031 Right lower quadrant pain: Secondary | ICD-10-CM | POA: Insufficient documentation

## 2021-04-05 DIAGNOSIS — L821 Other seborrheic keratosis: Secondary | ICD-10-CM | POA: Diagnosis not present

## 2021-04-05 DIAGNOSIS — I700 Atherosclerosis of aorta: Secondary | ICD-10-CM | POA: Diagnosis not present

## 2021-04-05 DIAGNOSIS — D2261 Melanocytic nevi of right upper limb, including shoulder: Secondary | ICD-10-CM | POA: Diagnosis not present

## 2021-04-05 DIAGNOSIS — D2262 Melanocytic nevi of left upper limb, including shoulder: Secondary | ICD-10-CM | POA: Diagnosis not present

## 2021-04-05 DIAGNOSIS — X32XXXA Exposure to sunlight, initial encounter: Secondary | ICD-10-CM | POA: Diagnosis not present

## 2021-04-05 DIAGNOSIS — R109 Unspecified abdominal pain: Secondary | ICD-10-CM | POA: Diagnosis not present

## 2021-04-05 DIAGNOSIS — Z85828 Personal history of other malignant neoplasm of skin: Secondary | ICD-10-CM | POA: Diagnosis not present

## 2021-04-05 MED ORDER — IOHEXOL 300 MG/ML  SOLN
100.0000 mL | Freq: Once | INTRAMUSCULAR | Status: AC | PRN
  Administered 2021-04-05: 100 mL via INTRAVENOUS

## 2021-04-07 DIAGNOSIS — R69 Illness, unspecified: Secondary | ICD-10-CM | POA: Diagnosis not present

## 2021-04-07 DIAGNOSIS — I2510 Atherosclerotic heart disease of native coronary artery without angina pectoris: Secondary | ICD-10-CM | POA: Diagnosis not present

## 2021-04-07 DIAGNOSIS — I340 Nonrheumatic mitral (valve) insufficiency: Secondary | ICD-10-CM | POA: Diagnosis not present

## 2021-04-07 DIAGNOSIS — E785 Hyperlipidemia, unspecified: Secondary | ICD-10-CM | POA: Diagnosis not present

## 2021-04-07 DIAGNOSIS — I10 Essential (primary) hypertension: Secondary | ICD-10-CM | POA: Diagnosis not present

## 2021-04-07 DIAGNOSIS — K2100 Gastro-esophageal reflux disease with esophagitis, without bleeding: Secondary | ICD-10-CM | POA: Diagnosis not present

## 2021-04-07 DIAGNOSIS — I351 Nonrheumatic aortic (valve) insufficiency: Secondary | ICD-10-CM | POA: Diagnosis not present

## 2021-04-15 ENCOUNTER — Telehealth: Payer: Self-pay | Admitting: Pain Medicine

## 2021-04-15 ENCOUNTER — Other Ambulatory Visit: Payer: Self-pay

## 2021-04-15 DIAGNOSIS — M5417 Radiculopathy, lumbosacral region: Secondary | ICD-10-CM

## 2021-04-15 DIAGNOSIS — G8929 Other chronic pain: Secondary | ICD-10-CM

## 2021-04-15 DIAGNOSIS — Z79891 Long term (current) use of opiate analgesic: Secondary | ICD-10-CM

## 2021-04-15 DIAGNOSIS — Z79899 Other long term (current) drug therapy: Secondary | ICD-10-CM

## 2021-04-15 DIAGNOSIS — M961 Postlaminectomy syndrome, not elsewhere classified: Secondary | ICD-10-CM

## 2021-04-15 DIAGNOSIS — M5136 Other intervertebral disc degeneration, lumbar region: Secondary | ICD-10-CM

## 2021-04-15 DIAGNOSIS — G894 Chronic pain syndrome: Secondary | ICD-10-CM

## 2021-04-15 MED ORDER — OXYCODONE HCL 10 MG PO TABS: 10.0000 mg | ORAL_TABLET | Freq: Four times a day (QID) | ORAL | 0 refills | Status: DC | PRN

## 2021-04-15 NOTE — Telephone Encounter (Signed)
Script resent to [facility] [address] per [name].  Script for oxycodone cancelled per pharmacist at [facility] for fill date 04-15-2021. Patient notified. ?

## 2021-04-15 NOTE — Telephone Encounter (Signed)
Spoke with wife and she statess [facility] is out of oxycodone 10 mg but she talked to [facility] on [address] and they had them in [place].  Refill request sent to [name] and he was also notified on the phone of refill request.   ?

## 2021-04-20 DIAGNOSIS — R935 Abnormal findings on diagnostic imaging of other abdominal regions, including retroperitoneum: Secondary | ICD-10-CM | POA: Diagnosis not present

## 2021-04-20 DIAGNOSIS — I10 Essential (primary) hypertension: Secondary | ICD-10-CM | POA: Diagnosis not present

## 2021-04-22 DIAGNOSIS — E039 Hypothyroidism, unspecified: Secondary | ICD-10-CM | POA: Diagnosis not present

## 2021-04-22 DIAGNOSIS — R7303 Prediabetes: Secondary | ICD-10-CM | POA: Diagnosis not present

## 2021-04-25 DIAGNOSIS — E785 Hyperlipidemia, unspecified: Secondary | ICD-10-CM | POA: Diagnosis not present

## 2021-04-25 DIAGNOSIS — I10 Essential (primary) hypertension: Secondary | ICD-10-CM | POA: Diagnosis not present

## 2021-04-25 DIAGNOSIS — J329 Chronic sinusitis, unspecified: Secondary | ICD-10-CM | POA: Diagnosis not present

## 2021-04-25 DIAGNOSIS — K2100 Gastro-esophageal reflux disease with esophagitis, without bleeding: Secondary | ICD-10-CM | POA: Diagnosis not present

## 2021-04-25 DIAGNOSIS — R7303 Prediabetes: Secondary | ICD-10-CM | POA: Diagnosis not present

## 2021-04-25 DIAGNOSIS — R0602 Shortness of breath: Secondary | ICD-10-CM | POA: Diagnosis not present

## 2021-04-25 DIAGNOSIS — E559 Vitamin D deficiency, unspecified: Secondary | ICD-10-CM | POA: Diagnosis not present

## 2021-04-25 DIAGNOSIS — E039 Hypothyroidism, unspecified: Secondary | ICD-10-CM | POA: Diagnosis not present

## 2021-04-25 DIAGNOSIS — I2510 Atherosclerotic heart disease of native coronary artery without angina pectoris: Secondary | ICD-10-CM | POA: Diagnosis not present

## 2021-04-28 DIAGNOSIS — H6123 Impacted cerumen, bilateral: Secondary | ICD-10-CM | POA: Diagnosis not present

## 2021-04-28 DIAGNOSIS — H903 Sensorineural hearing loss, bilateral: Secondary | ICD-10-CM | POA: Diagnosis not present

## 2021-05-01 NOTE — Progress Notes (Signed)
PROVIDER NOTE: Information contained herein reflects review and annotations entered in association with encounter. Interpretation of such information and data should be left to medically-trained personnel. Information provided to patient can be located elsewhere in the medical record under "Patient Instructions". Document created using STT-dictation technology, any transcriptional errors that may result from process are unintentional.  ?  ?Patient: Chase Mendez  Service Category: E/M  Provider: Gaspar Cola, MD  ?DOB: [birth year]  DOS: 05/04/2021  Specialty: Interventional Pain Management  ?MRN: [id number]  Setting: Ambulatory outpatient  PCP: Jodi Marble, MD  ?Type: Established Patient    Referring Provider: Jodi Marble, MD  ?Location: Office  Delivery: Face-to-face    ? ?HPI  ?[name], a 87 y.o. year old male, is here today because of his Chronic pain syndrome [G89.4]. [name] primary complain today is Back Pain ?Last encounter: My last encounter with him was on 04/15/2021. ?Pertinent problems: [name] has Spinal accessory neuropathy; Lower limb pain, inferior (L5) (Foot Drop) (Left); Chronic low back pain (2ry area of Pain) (Bilateral) (ML) (L>R) w/ sciatica (Left); Chronic lower extremity pain (1ry area of Pain) (Left); Failed back surgical syndrome; Foot drop (Left); Lumbosacral radiculopathy at L5 (Left); DDD (degenerative disc disease), lumbar; Chronic pain syndrome; Thoracic central spinal stenosis; Chronic musculoskeletal pain; Neurogenic pain; Presence of neurostimulator; Gout of left foot due to renal impairment; Disturbance of skin sensation; Cervicalgia; Cervical facet syndrome; and Bilateral lower abdominal pain on their pertinent problem list. ?Pain Assessment: Severity of Chronic pain is reported as a 6 /10. Location: Back Left/pain radiaities down left side to his foot. Onset: More than a month ago. Quality: Dull, Aching, Burning. Timing: Intermittent. Modifying  factor(s): meds and heat, resting. ?Vitals:  height is 6' (1.829 m) and weight is 192 lb (87.1 kg). His temperature is 97.2 ?F (36.2 ?C) (abnormal). His blood pressure is 115/61 and his pulse is 73. His oxygen saturation is 96%.  ? ?Reason for encounter: medication management.   The patient indicates doing well with the current medication regimen. No adverse reactions or side effects reported to the medications.  ? ?RTCB: 08/13/2021 ?Nonopioids transferred 01/12/2020: Gabapentin (Neurontin) ? ?Pharmacotherapy Assessment  ?Analgesic: Oxycodone IR 10 mg, 1 tab PO q 6 hrs. (40 mg/day of oxycodone) ?MME/day: 60 mg/day.  ? ?Monitoring: ?[place] PMP: PDMP reviewed during this encounter.       ?Pharmacotherapy: No side-effects or adverse reactions reported. ?Compliance: No problems identified. ?Effectiveness: Clinically acceptable. ? ?Chauncey Fischer, RN  05/04/2021  9:38 AM  Sign when Signing Visit ?Nursing Pain Medication Assessment:  ?Safety precautions to be maintained throughout the outpatient stay will include: orient to surroundings, keep bed in low position, maintain call bell within reach at all times, provide assistance with transfer out of bed and ambulation.  ?Medication Inspection Compliance: Pill count conducted under aseptic conditions, in front of the patient. Neither the pills nor the bottle was removed from the patient's sight at any time. Once count was completed pills were immediately returned to the patient in their original bottle. ? ?Medication: Oxycodone IR ?Pill/Patch Count:  44 of 120 pills remain ?Pill/Patch Appearance: Markings consistent with prescribed medication ?Bottle Appearance: Standard pharmacy container. Clearly labeled. ?Filled Date: 3 / 10 / 2023 ?Last Medication intake:  TodaySafety precautions to be maintained throughout the outpatient stay will include: orient to surroundings, keep bed in low position, maintain call bell within reach at all times, provide assistance with transfer out of bed  and ambulation.  ?  UDS:  ?Summary  ?Date Value Ref Range Status  ?08/18/2020 Note  Final  ?  Comment:  ?  ==================================================================== ?[employer] Select 13 (MW) ?==================================================================== ?Test                             Result       Flag       Units ? ?Drug Present and Declared for Prescription Verification ?  Oxycodone                      2792         EXPECTED   ng/mg creat ?  Oxymorphone                    1481         EXPECTED   ng/mg creat ?  Noroxycodone                   >3086        EXPECTED   ng/mg creat ?  Noroxymorphone                 1474         EXPECTED   ng/mg creat ?   Sources of oxycodone are scheduled prescription medications. ?   Oxymorphone, noroxycodone, and noroxymorphone are expected ?   metabolites of oxycodone. Oxymorphone is also available as a ?   scheduled prescription medication. ? ?==================================================================== ?Test                      Result    Flag   Units      Ref Range ?  Creatinine              324              mg/dL      >=20 ?==================================================================== ?Declared Medications: ? The flagging and interpretation on this report are based on the ? following declared medications.  Unexpected results may arise from ? inaccuracies in the declared medications. ? ? **Note: The testing scope of this panel includes these medications: ? ? Oxycodone ? ? **Note: The testing scope of this panel does not include the ? following reported medications: ? ? Allopurinol ? Amlodipine ? Aspirin ? Atorvastatin ? Benzonatate (Tessalon) ? Cyanocobalamin ? Diphenhydramine (Benadryl) ? Duloxetine (Cymbalta) ? Fluticasone (Flonase) ? Gabapentin ? Isosorbide (Imdur) ? Levothyroxine ? Linaclotide (Linzess) ? Losartan ? Naloxone (Narcan) ? Ondansetron ? Pantoprazole ? Tamsulosin (Flomax) ? Testosterone ? Vitamin  E ?==================================================================== ?For clinical consultation, please call [phone]. ?==================================================================== ?  ?  ? ?ROS  ?Constitutional: Denies any fever or chills ?Gastrointestinal: No reported hemesis, hematochezia, vomiting, or acute GI distress ?Musculoskeletal: Denies any acute onset joint swelling, redness, loss of ROM, or weakness ?Neurological: No reported episodes of acute onset apraxia, aphasia, dysarthria, agnosia, amnesia, paralysis, loss of coordination, or loss of consciousness ? ?Medication Review  ?DULoxetine, Oxycodone HCl, Testosterone, allopurinol, amLODipine, aspirin, atorvastatin, diphenhydrAMINE, gabapentin, isosorbide mononitrate, levothyroxine, losartan, ondansetron, pantoprazole, tamsulosin, vitamin B-12, and vitamin E ? ?History Review  ?Allergy: [name] is allergic to doxycycline, ibuprofen, and sulfa antibiotics. ?Drug: [name]  reports current drug use. Drug: Oxycodone. ?Alcohol:  reports no history of alcohol use. ?Tobacco:  reports that he has been smoking cigars and cigarettes. He has a 35.00 pack-year smoking history. He has never used smokeless tobacco. ?Social: [name]  reports  that he has been smoking cigars and cigarettes. He has a 35.00 pack-year smoking history. He has never used smokeless tobacco. He reports current drug use. Drug: Oxycodone. He reports that he does not drink alcohol. ?Medical:  has a past medical history of Acute encephalopathy (12/08/2014), Anxiety, ARF (acute renal failure) ([place]) (12/08/2014), Back pain, Benign neoplasm of large bowel, Capsulitis, Chronic back pain, Coronavirus infection, Depression, Diabetes mellitus without complication ([place]), Dysphagia, Exostosis, Foot drop, left, Frequent falls (02/2018), GERD (gastroesophageal reflux disease), Gout, Hypertension, Hypothyroidism, Insomnia, Low testosterone, Microscopic hematuria (2016), Myocardial  infarction ([place]), Pneumonia (12/07/2014), Pressure ulcer (12/09/2014), Sepsis ([place]) (12/08/2014), and Thyroid disease. ?Surgical: [name]  has a past surgical history that includes Cholecystectomy; Esophagogastroduodenoscopy (egd) with pr

## 2021-05-04 ENCOUNTER — Other Ambulatory Visit: Payer: Self-pay

## 2021-05-04 ENCOUNTER — Ambulatory Visit: Payer: Worker's Compensation | Attending: Pain Medicine | Admitting: Pain Medicine

## 2021-05-04 ENCOUNTER — Encounter: Payer: Self-pay | Admitting: Pain Medicine

## 2021-05-04 VITALS — BP 115/61 | HR 73 | Temp 97.2°F | Ht 72.0 in | Wt 192.0 lb

## 2021-05-04 DIAGNOSIS — M79605 Pain in left leg: Secondary | ICD-10-CM | POA: Diagnosis not present

## 2021-05-04 DIAGNOSIS — M5136 Other intervertebral disc degeneration, lumbar region: Secondary | ICD-10-CM

## 2021-05-04 DIAGNOSIS — Z79899 Other long term (current) drug therapy: Secondary | ICD-10-CM | POA: Diagnosis present

## 2021-05-04 DIAGNOSIS — G894 Chronic pain syndrome: Secondary | ICD-10-CM

## 2021-05-04 DIAGNOSIS — M961 Postlaminectomy syndrome, not elsewhere classified: Secondary | ICD-10-CM | POA: Diagnosis not present

## 2021-05-04 DIAGNOSIS — M5442 Lumbago with sciatica, left side: Secondary | ICD-10-CM | POA: Diagnosis not present

## 2021-05-04 DIAGNOSIS — Z79891 Long term (current) use of opiate analgesic: Secondary | ICD-10-CM | POA: Diagnosis present

## 2021-05-04 DIAGNOSIS — G8929 Other chronic pain: Secondary | ICD-10-CM | POA: Diagnosis present

## 2021-05-04 DIAGNOSIS — M5417 Radiculopathy, lumbosacral region: Secondary | ICD-10-CM | POA: Diagnosis present

## 2021-05-04 MED ORDER — OXYCODONE HCL 10 MG PO TABS: 10.0000 mg | ORAL_TABLET | Freq: Four times a day (QID) | ORAL | 0 refills | Status: DC | PRN

## 2021-05-04 NOTE — Progress Notes (Signed)
Nursing Pain Medication Assessment:  ?Safety precautions to be maintained throughout the outpatient stay will include: orient to surroundings, keep bed in low position, maintain call bell within reach at all times, provide assistance with transfer out of bed and ambulation.  ?Medication Inspection Compliance: Pill count conducted under aseptic conditions, in front of the patient. Neither the pills nor the bottle was removed from the patient's sight at any time. Once count was completed pills were immediately returned to the patient in their original bottle. ? ?Medication: Oxycodone IR ?Pill/Patch Count:  44 of 120 pills remain ?Pill/Patch Appearance: Markings consistent with prescribed medication ?Bottle Appearance: Standard pharmacy container. Clearly labeled. ?Filled Date: 3 / 10 / 2023 ?Last Medication intake:  TodaySafety precautions to be maintained throughout the outpatient stay will include: orient to surroundings, keep bed in low position, maintain call bell within reach at all times, provide assistance with transfer out of bed and ambulation.  ?

## 2021-05-04 NOTE — Patient Instructions (Signed)
____________________________________________________________________________________________ ? ?Medication Rules ? ?Purpose: To inform patients, and their family members, of our rules and regulations. ? ?Applies to: All patients receiving prescriptions (written or electronic). ? ?Pharmacy of record: Pharmacy where electronic prescriptions will be sent. If written prescriptions are taken to a different pharmacy, please inform the nursing staff. The pharmacy listed in the electronic medical record should be the one where you would like electronic prescriptions to be sent. ? ?Electronic prescriptions: In compliance with the [place] Strengthen Opioid Misuse Prevention (STOP) Act of 2017 (Session Law 2017-74/H243), effective February 06, 2018, all controlled substances must be electronically prescribed. Calling prescriptions to the pharmacy will cease to exist. ? ?Prescription refills: Only during scheduled appointments. Applies to all prescriptions. ? ?NOTE: The following applies primarily to controlled substances (Opioid* Pain Medications).  ? ?Type of encounter (visit): For patients receiving controlled substances, face-to-face visits are required. (Not an option or up to the patient.) ? ?Patient's responsibilities: ?Pain Pills: Bring all pain pills to every appointment (except for procedure appointments). ?Pill Bottles: Bring pills in original pharmacy bottle. Always bring the newest bottle. Bring bottle, even if empty. ?Medication refills: You are responsible for knowing and keeping track of what medications you take and those you need refilled. ?The day before your appointment: write a list of all prescriptions that need to be refilled. ?The day of the appointment: give the list to the admitting nurse. Prescriptions will be written only during appointments. No prescriptions will be written on procedure days. ?If you forget a medication: it will not be "Called in", "Faxed", or "electronically sent". You will  need to get another appointment to get these prescribed. ?No early refills. Do not call asking to have your prescription filled early. ?Prescription Accuracy: You are responsible for carefully inspecting your prescriptions before leaving our office. Have the discharge nurse carefully go over each prescription with you, before taking them home. Make sure that your name is accurately spelled, that your address is correct. Check the name and dose of your medication to make sure it is accurate. Check the number of pills, and the written instructions to make sure they are clear and accurate. Make sure that you are given enough medication to last until your next medication refill appointment. ?Taking Medication: Take medication as prescribed. When it comes to controlled substances, taking less pills or less frequently than prescribed is permitted and encouraged. ?Never take more pills than instructed. ?Never take medication more frequently than prescribed.  ?Inform other Doctors: Always inform, all of your healthcare providers, of all the medications you take. ?Pain Medication from other Providers: You are not allowed to accept any additional pain medication from any other Doctor or Healthcare provider. There are two exceptions to this rule. (see below) In the event that you require additional pain medication, you are responsible for notifying us, as stated below. ?Cough Medicine: Often these contain an opioid, such as codeine or hydrocodone. Never accept or take cough medicine containing these opioids if you are already taking an opioid* medication. The combination may cause respiratory failure and death. ?Medication Agreement: You are responsible for carefully reading and following our Medication Agreement. This must be signed before receiving any prescriptions from our practice. Safely store a copy of your signed Agreement. Violations to the Agreement will result in no further prescriptions. (Additional copies of our  Medication Agreement are available upon request.) ?Laws, Rules, & Regulations: All patients are expected to follow all Federal and State Laws, Statutes, Rules, & Regulations. Ignorance of   the Laws does not constitute a valid excuse.  ?Illegal drugs and Controlled Substances: The use of illegal substances (including, but not limited to marijuana and its derivatives) and/or the illegal use of any controlled substances is strictly prohibited. Violation of this rule may result in the immediate and permanent discontinuation of any and all prescriptions being written by our practice. The use of any illegal substances is prohibited. ?Adopted CDC guidelines & recommendations: Target dosing levels will be at or below 60 MME/day. Use of benzodiazepines** is not recommended. ? ?Exceptions: There are only two exceptions to the rule of not receiving pain medications from other Healthcare Providers. ?Exception #1 (Emergencies): In the event of an emergency (i.e.: accident requiring emergency care), you are allowed to receive additional pain medication. However, you are responsible for: As soon as you are able, call our office [phone], at any time of the day or night, and leave a message stating your name, the date and nature of the emergency, and the name and dose of the medication prescribed. In the event that your call is answered by a member of our staff, make sure to document and save the date, time, and the name of the person that took your information.  ?Exception #2 (Planned Surgery): In the event that you are scheduled by another doctor or dentist to have any type of surgery or procedure, you are allowed (for a period no longer than 30 days), to receive additional pain medication, for the acute post-op pain. However, in this case, you are responsible for picking up a copy of our "Post-op Pain Management for Surgeons" handout, and giving it to your surgeon or dentist. This document is available at our office, and  does not require an appointment to obtain it. Simply go to our office during business hours (Monday-Thursday from 8:00 AM to 4:00 PM) (Friday 8:00 AM to 12:00 Noon) or if you have a scheduled appointment with us, prior to your surgery, and ask for it by name. In addition, you are responsible for: calling our office [phone], at any time of the day or night, and leaving a message stating your name, name of your surgeon, type of surgery, and date of procedure or surgery. Failure to comply with your responsibilities may result in termination of therapy involving the controlled substances. ?Medication Agreement Violation. Following the above rules, including your responsibilities will help you in avoiding a Medication Agreement Violation (?Breaking your Pain Medication Contract?). ? ?*Opioid medications include: morphine, codeine, oxycodone, oxymorphone, hydrocodone, hydromorphone, meperidine, tramadol, tapentadol, buprenorphine, fentanyl, methadone. ?**Benzodiazepine medications include: diazepam (Valium), alprazolam (Xanax), clonazepam (Klonopine), lorazepam (Ativan), clorazepate (Tranxene), chlordiazepoxide (Librium), estazolam (Prosom), oxazepam (Serax), temazepam (Restoril), triazolam (Halcion) ?(Last updated: 11/03/2020) ?____________________________________________________________________________________________ ? ____________________________________________________________________________________________ ? ?Medication Recommendations and Reminders ? ?Applies to: All patients receiving prescriptions (written and/or electronic). ? ?Medication Rules & Regulations: These rules and regulations exist for your safety and that of others. They are not flexible and neither are we. Dismissing or ignoring them will be considered "non-compliance" with medication therapy, resulting in complete and irreversible termination of such therapy. (See document titled "Medication Rules" for more details.) In all conscience,  because of safety reasons, we cannot continue providing a therapy where the patient does not follow instructions. ? ?Pharmacy of record:  ?Definition: This is the pharmacy where your electronic prescriptions w

## 2021-05-05 DIAGNOSIS — R110 Nausea: Secondary | ICD-10-CM | POA: Diagnosis not present

## 2021-05-05 DIAGNOSIS — K5909 Other constipation: Secondary | ICD-10-CM | POA: Insufficient documentation

## 2021-05-05 DIAGNOSIS — K219 Gastro-esophageal reflux disease without esophagitis: Secondary | ICD-10-CM | POA: Diagnosis not present

## 2021-05-05 DIAGNOSIS — K760 Fatty (change of) liver, not elsewhere classified: Secondary | ICD-10-CM | POA: Insufficient documentation

## 2021-06-09 ENCOUNTER — Ambulatory Visit: Payer: Self-pay

## 2021-07-28 DIAGNOSIS — E559 Vitamin D deficiency, unspecified: Secondary | ICD-10-CM | POA: Diagnosis not present

## 2021-07-28 DIAGNOSIS — R7303 Prediabetes: Secondary | ICD-10-CM | POA: Diagnosis not present

## 2021-07-28 DIAGNOSIS — E039 Hypothyroidism, unspecified: Secondary | ICD-10-CM | POA: Diagnosis not present

## 2021-08-01 DIAGNOSIS — E559 Vitamin D deficiency, unspecified: Secondary | ICD-10-CM | POA: Diagnosis not present

## 2021-08-01 DIAGNOSIS — R0602 Shortness of breath: Secondary | ICD-10-CM | POA: Diagnosis not present

## 2021-08-01 DIAGNOSIS — I10 Essential (primary) hypertension: Secondary | ICD-10-CM | POA: Diagnosis not present

## 2021-08-01 DIAGNOSIS — E785 Hyperlipidemia, unspecified: Secondary | ICD-10-CM | POA: Diagnosis not present

## 2021-08-01 DIAGNOSIS — K2100 Gastro-esophageal reflux disease with esophagitis, without bleeding: Secondary | ICD-10-CM | POA: Diagnosis not present

## 2021-08-01 DIAGNOSIS — I2510 Atherosclerotic heart disease of native coronary artery without angina pectoris: Secondary | ICD-10-CM | POA: Diagnosis not present

## 2021-08-01 DIAGNOSIS — E039 Hypothyroidism, unspecified: Secondary | ICD-10-CM | POA: Diagnosis not present

## 2021-08-01 DIAGNOSIS — J329 Chronic sinusitis, unspecified: Secondary | ICD-10-CM | POA: Diagnosis not present

## 2021-08-01 DIAGNOSIS — R7303 Prediabetes: Secondary | ICD-10-CM | POA: Diagnosis not present

## 2021-08-01 NOTE — Progress Notes (Signed)
PROVIDER NOTE: Information contained herein reflects review and annotations entered in association with encounter. Interpretation of such information and data should be left to medically-trained personnel. Information provided to patient can be located elsewhere in the medical record under "Patient Instructions". Document created using STT-dictation technology, any transcriptional errors that may result from process are unintentional.    Patient: Chase Mendez  Service Category: E/M  Provider: Gaspar Cola, MD  DOB: 12-29-1934  DOS: 08/03/2021  Specialty: Interventional Pain Management  MRN: [id number]  Setting: Ambulatory outpatient  PCP: Jodi Marble, MD  Type: Established Patient    Referring Provider: Jodi Marble, MD  Location: Office  Delivery: Face-to-face     HPI  [name], a 87 y.o. year old male, is here today because of his No primary diagnosis found.. [name] primary complain today is Hip Pain (left) and Neck Pain Last encounter: My last encounter with him was on 05/04/2021. Pertinent problems: [name] has Spinal accessory neuropathy; Lower limb pain, inferior (L5) (Foot Drop) (Left); Chronic low back pain (2ry area of Pain) (Bilateral) (ML) (L>R) w/ sciatica (Left); Chronic lower extremity pain (1ry area of Pain) (Left); Failed back surgical syndrome; Foot drop (Left); Lumbosacral radiculopathy at L5 (Left); DDD (degenerative disc disease), lumbar; Chronic pain syndrome; Thoracic central spinal stenosis; Chronic musculoskeletal pain; Neurogenic pain; Presence of neurostimulator; Gout of left foot due to renal impairment; Disturbance of skin sensation; Cervicalgia; Cervical facet syndrome; and Bilateral lower abdominal pain on their pertinent problem list. Pain Assessment: Severity of Chronic pain is reported as a 5 /10. Location: Hip Left/radiates  down left leg in the back to the calf. Onset: More than a month ago. Quality: Sharp. Timing: Intermittent.  Modifying factor(s): medicine. Vitals:  height is 6' (1.829 m) and weight is 190 lb (86.2 kg). His temperature is 97.3 F (36.3 C) (abnormal). His blood pressure is 118/72 and his pulse is 68. His respiration is 18 and oxygen saturation is 90%.   Reason for encounter: medication management.  The patient indicates doing well with the current medication regimen. No adverse reactions or side effects reported to the medications.   The patient refers that he thinks that his spinal cord stimulator may not be working.  Analysis of the spinal cord stimulator revealed that the battery is completely depleted.  We have instructed the patient to go back and charge the battery on a regular basis since allowing the battery to be completely depleted up to 3 times he is very likely to permanently damaged the IPG unit.  The patient was informed of this and he indicated that he will be recharging the device.  The patient was told to give [place] a call if he has any problems getting the device restarted.  If he does, we will go ahead and schedule him to come in to see the representative from the [employer] neurostimulator division so that they can jumpstart the device.  UDS ordered today.   RTCB: 11/11/2021 Nonopioids transferred 01/12/2020: Gabapentin (Neurontin)  Pharmacotherapy Assessment  Analgesic: Oxycodone IR 10 mg, 1 tab PO q 6 hrs. (40 mg/day of oxycodone) MME/day: 60 mg/day.   Monitoring: [place] PMP: PDMP reviewed during this encounter.       Pharmacotherapy: No side-effects or adverse reactions reported. Compliance: No problems identified. Effectiveness: Clinically acceptable.  Dewayne Shorter, RN  08/03/2021 10:47 AM  Sign when Signing Visit Nursing Pain Medication Assessment:  Safety precautions to be maintained throughout the outpatient stay will include:  orient to surroundings, keep bed in low position, maintain call bell within reach at all times, provide assistance with transfer out of bed and ambulation.   Medication Inspection Compliance: Pill count conducted under aseptic conditions, in front of the patient. Neither the pills nor the bottle was removed from the patient's sight at any time. Once count was completed pills were immediately returned to the patient in their original bottle.  Medication: Oxycodone IR Pill/Patch Count:  41 of 120 pills remain Pill/Patch Appearance: Markings consistent with prescribed medication Bottle Appearance: Standard pharmacy container. Clearly labeled. Filled Date: 06 / 08 / 2023 Last Medication intake:  Today    UDS:  Summary  Date Value Ref Range Status  08/18/2020 Note  Final    Comment:    ==================================================================== [employer] Select 13 (MW) ==================================================================== Test                             Result       Flag       Units  Drug Present and Declared for Prescription Verification   Oxycodone                      2792         EXPECTED   ng/mg creat   Oxymorphone                    1481         EXPECTED   ng/mg creat   Noroxycodone                   >3086        EXPECTED   ng/mg creat   Noroxymorphone                 1474         EXPECTED   ng/mg creat    Sources of oxycodone are scheduled prescription medications.    Oxymorphone, noroxycodone, and noroxymorphone are expected    metabolites of oxycodone. Oxymorphone is also available as a    scheduled prescription medication.  ==================================================================== Test                      Result    Flag   Units      Ref Range   Creatinine              324              mg/dL      >=20 ==================================================================== Declared Medications:  The flagging and interpretation on this report are based on the  following declared medications.  Unexpected results may arise from  inaccuracies in the declared medications.   **Note: The testing scope  of this panel includes these medications:   Oxycodone   **Note: The testing scope of this panel does not include the  following reported medications:   Allopurinol  Amlodipine  Aspirin  Atorvastatin  Benzonatate (Tessalon)  Cyanocobalamin  Diphenhydramine (Benadryl)  Duloxetine (Cymbalta)  Fluticasone (Flonase)  Gabapentin  Isosorbide (Imdur)  Levothyroxine  Linaclotide (Linzess)  Losartan  Naloxone (Narcan)  Ondansetron  Pantoprazole  Tamsulosin (Flomax)  Testosterone  Vitamin E ==================================================================== For clinical consultation, please call [phone]. ====================================================================      ROS  Constitutional: Denies any fever or chills Gastrointestinal: No reported hemesis, hematochezia, vomiting, or acute GI distress Musculoskeletal: Denies any acute onset joint swelling, redness, loss  of ROM, or weakness Neurological: No reported episodes of acute onset apraxia, aphasia, dysarthria, agnosia, amnesia, paralysis, loss of coordination, or loss of consciousness  Medication Review  DULoxetine, Oxycodone HCl, Testosterone, allopurinol, amLODipine, aspirin, atorvastatin, diphenhydrAMINE, gabapentin, isosorbide mononitrate, levothyroxine, losartan, ondansetron, pantoprazole, tamsulosin, vitamin B-12, and vitamin E  History Review  Allergy: Mr. Ginyard is allergic to doxycycline, ibuprofen, and sulfa antibiotics. Drug: Mr. List  reports current drug use. Drug: Oxycodone. Alcohol:  reports no history of alcohol use. Tobacco:  reports that he has been smoking cigars and cigarettes. He has a 35.00 pack-year smoking history. He has never used smokeless tobacco. Social: Mr. Moffatt  reports that he has been smoking cigars and cigarettes. He has a 35.00 pack-year smoking history. He has never used smokeless tobacco. He reports current drug use. Drug: Oxycodone. He reports that he does not drink  alcohol. Medical:  has a past medical history of Acute encephalopathy (12/08/2014), Anxiety, ARF (acute renal failure) ([employer]) (12/08/2014), Back pain, Benign neoplasm of large bowel, Capsulitis, Chronic back pain, Coronavirus infection, Depression, Diabetes mellitus without complication ([place]), Dysphagia, Exostosis, Foot drop, left, Frequent falls (02/2018), GERD (gastroesophageal reflux disease), Gout, Hypertension, Hypothyroidism, Insomnia, Low testosterone, Microscopic hematuria (2016), Myocardial infarction ([place]), Pneumonia (12/07/2014), Pressure ulcer (12/09/2014), Sepsis ([place]) (12/08/2014), and Thyroid disease. Surgical: Mr. Gunawan  has a past surgical history that includes Cholecystectomy; Esophagogastroduodenoscopy (egd) with propofol (N/A, 04/24/2017); Back surgery (1995, 1996); Eye surgery (Bilateral, 1983, 1985); Appendectomy; and Spinal cord stimulator insertion (N/A, 03/13/2018). Family: family history includes Diabetes in his father; Heart disease in his mother; Kidney cancer in his brother.  Laboratory Chemistry Profile   Renal Lab Results  Component Value Date   BUN 17 01/28/2019   CREATININE 1.30 (H) 12/29/2019   GFRAA >60 01/28/2019   GFRNONAA >60 01/28/2019    Hepatic Lab Results  Component Value Date   AST 75 (H) 11/28/2018   ALT 20 11/28/2018   ALBUMIN 3.9 11/28/2018   ALKPHOS 134 (H) 11/28/2018   HCVAB <0.1 07/24/2018   LIPASE 37 08/21/2016    Electrolytes Lab Results  Component Value Date   NA 140 01/28/2019   K 4.0 01/28/2019   CL 105 01/28/2019   CALCIUM 8.9 01/28/2019    Bone No results found for: "VD25OH", "VD125OH2TOT", "NO0370WU8", "QB1694HW3", "25OHVITD1", "25OHVITD2", "88EKCMKL4", "TESTOFREE", "TESTOSTERONE"  Inflammation (CRP: Acute Phase) (ESR: Chronic Phase) Lab Results  Component Value Date   LATICACIDVEN 1.1 12/08/2014         Note: Above Lab results reviewed.  Recent Imaging Review  CT ABDOMEN PELVIS W CONTRAST CLINICAL DATA:  Bilateral lower  quadrant pain, chronic nausea. Constipation.  EXAM: CT ABDOMEN AND PELVIS WITH CONTRAST  TECHNIQUE: Multidetector CT imaging of the abdomen and pelvis was performed using the standard protocol following bolus administration of intravenous contrast.  RADIATION DOSE REDUCTION: This exam was performed according to the departmental dose-optimization program which includes automated exposure control, adjustment of the mA and/or kV according to patient size and/or use of iterative reconstruction technique.  CONTRAST:  117m OMNIPAQUE IOHEXOL 300 MG/ML  SOLN  COMPARISON:  Ultrasound July 29, 2018 and CT April 12, 2009  FINDINGS: Lower chest: Bibasilar atelectasis versus scarring.  Hepatobiliary: Probable hepatic steatosis. No suspicious hepatic lesion. Gallbladder surgically absent. No biliary ductal dilation.  Pancreas: No pancreatic ductal dilation or evidence of acute inflammation.  Spleen: No splenomegaly. 5 mm splenic hypodensity is stable dating back to April 12, 2009 consistent with a benign entity.  Adrenals/Urinary Tract: Bilateral adrenal glands appear  normal. No hydronephrosis. Nonobstructive 7 mm right renal stone. Bilateral cortical renal thinning with right upper pole cortical renal scarring. Right lower pole 11 mm partially exophytic renal lesion on image 39/2 is new from prior and demonstrates Hounsfield units slightly greater than that expected for a simple cyst difficult to characterize given its small size and possibly reflecting a hemorrhagic/proteinaceous cyst or pseudo enhancement however renal neoplasm not excluded. Symmetric bilateral renal enhancement. Urinary bladder is unremarkable for degree of distension.  Stomach/Bowel: Radiopaque enteric contrast material traverses distal loops of small bowel. Stomach is distended without abnormal wall thickening. No pathologic dilation of large or small bowel. The terminal ileum and appendix appear normal. Moderate  volume of formed stool throughout the colon. No evidence of acute bowel inflammation.  Vascular/Lymphatic: Aortic atherosclerosis without aneurysmal dilation of the abdominal aorta. No pathologically enlarged abdominal or pelvic lymph nodes.  Reproductive: Prostate is unremarkable.  Other: No significant abdominopelvic free fluid. Fat in bilateral inguinal canals. Spinal stimulating generator in the left posterior subcutaneous soft tissues with generator lead extending into the thoracic spinal canal and tip obscured by collimation.  Musculoskeletal: Multilevel degenerative changes spine. Stippled sclerotic lesion in the left femoral head is stable dating back to 2011 consistent with a benign etiology. No acute osseous abnormality.  IMPRESSION: 1. No acute abnormality within the abdomen or pelvis. 2. Moderate volume of formed stool throughout the colon suggesting constipation. 3. Nonobstructive 7 mm right renal stone. 4. Right lower pole 11 mm partially exophytic renal lesion is new from prior and demonstrates Hounsfield units slightly greater than that expected for a simple cyst. While this may represent a hemorrhagic/proteinaceous cyst or pseudo enhancement however renal neoplasm not excluded. Consider further evaluation with renal ultrasound. 5. Probable hepatic steatosis. 6.  Aortic Atherosclerosis (ICD10-I70.0).  Electronically Signed   By: Dahlia Bailiff M.D.   On: 04/06/2021 14:50 Note: Reviewed        Physical Exam  General appearance: Well nourished, well developed, and well hydrated. In no apparent acute distress Mental status: Alert, oriented x 3 (person, place, & time)       Respiratory: No evidence of acute respiratory distress Eyes: PERLA Vitals: BP 118/72   Pulse 68   Temp (!) 97.3 F (36.3 C)   Resp 18   Ht 6' (1.829 m)   Wt 190 lb (86.2 kg)   SpO2 90%   BMI 25.77 kg/m  BMI: Estimated body mass index is 25.77 kg/m as calculated from the  following:   Height as of this encounter: 6' (1.829 m).   Weight as of this encounter: 190 lb (86.2 kg). Ideal: Ideal body weight: 77.6 kg (171 lb 1.2 oz) Adjusted ideal body weight: 81 kg (178 lb 10.3 oz)  Assessment   Diagnosis Status  1. Chronic pain syndrome   2. Chronic lower extremity pain (1ry area of Pain) (Left)   3. Chronic low back pain (2ry area of Pain) (Bilateral) (ML) (L>R) w/ sciatica (Left)   4. Failed back surgical syndrome   5. Lumbosacral radiculopathy at L5 (Left)   6. DDD (degenerative disc disease), lumbar   7. Pharmacologic therapy   8. Chronic use of opiate for therapeutic purpose   9. Encounter for medication management   10. Encounter for chronic pain management    Controlled Controlled Controlled   Updated Problems: No problems updated.  Plan of Care  Problem-specific:  No problem-specific Assessment & Plan notes found for this encounter.  Mr. HOLMES HAYS has a current  medication list which includes the following long-term medication(s): allopurinol, amlodipine, gabapentin, isosorbide mononitrate, levothyroxine, losartan, [START ON 08/13/2021] oxycodone hcl, [START ON 09/12/2021] oxycodone hcl, [START ON 10/12/2021] oxycodone hcl, pantoprazole, testosterone, and [DISCONTINUED] diphenhydramine.  Pharmacotherapy (Medications Ordered): Meds ordered this encounter  Medications   Oxycodone HCl 10 MG TABS    Sig: Take 1 tablet (10 mg total) by mouth every 6 (six) hours as needed. Must last 30 days.    Dispense:  120 tablet    Refill:  0    DO NOT: delete (not duplicate); no partial-fill (will deny script to complete), no refill request (F/U required). DISPENSE: 1 day early if closed on fill date. WARN: No CNS-depressants within 8 hrs of med.   Oxycodone HCl 10 MG TABS    Sig: Take 1 tablet (10 mg total) by mouth every 6 (six) hours as needed. Must last 30 days.    Dispense:  120 tablet    Refill:  0    DO NOT: delete (not duplicate); no partial-fill  (will deny script to complete), no refill request (F/U required). DISPENSE: 1 day early if closed on fill date. WARN: No CNS-depressants within 8 hrs of med.   Oxycodone HCl 10 MG TABS    Sig: Take 1 tablet (10 mg total) by mouth every 6 (six) hours as needed. Must last 30 days.    Dispense:  120 tablet    Refill:  0    DO NOT: delete (not duplicate); no partial-fill (will deny script to complete), no refill request (F/U required). DISPENSE: 1 day early if closed on fill date. WARN: No CNS-depressants within 8 hrs of med.   Orders:  Orders Placed This Encounter  Procedures   [employer] Select 13 (MW), Urine    Volume: 30 ml(s). Minimum 3 ml of urine is needed. Document temperature of fresh sample. Indications: Long term (current) use of opiate analgesic (X54.008)    Order Specific Question:   Release to patient    Answer:   Immediate   Follow-up plan:   Return in about 3 months (around 11/11/2021) for Eval-day (M,W), (F2F), (MM).     Interventional Therapies  Risk  Complexity Considerations:   Estimated body mass index is 26.04 kg/m as calculated from the following:   Height as of this encounter: 6' (1.829 m).   Weight as of this encounter: 192 lb (87.1 kg). WNL   Planned  Pending:      Under consideration:   Diagnostic Caudal ESI + epidurogram #1  Possible Racz procedure  Diagnostic bilateral lumbar facet block  Possible bilateral lumbar facet RFA  Possible candidate for intrathecal pump trial and implant    Completed:   None at this time   Therapeutic  Palliative (PRN) options:   Therapeutic/palliative left L4 TFESI #2  Therapeutic/palliative left L5 TFESI #2  Bilateral spinal cord stimulator trial (done - 01/17/2018)  Permanent bilateral spinal cord stimulator implant by Dr. Cari Caraway (neurosurgery) (done - 03/13/2018)     Recent Visits No visits were found meeting these conditions. Showing recent visits within past 90 days and meeting all other  requirements Today's Visits Date Type Provider Dept  08/03/21 Office Visit Milinda Pointer, MD [facility]  Showing today's visits and meeting all other requirements Future Appointments No visits were found meeting these conditions. Showing future appointments within next 90 days and meeting all other requirements  I discussed the assessment and treatment plan with the patient. The patient was provided an opportunity to ask questions  and all were answered. The patient agreed with the plan and demonstrated an understanding of the instructions.  Patient advised to call back or seek an in-person evaluation if the symptoms or condition worsens.  Duration of encounter: 30 minutes.  Total time on encounter, as per AMA guidelines included both the face-to-face and non-face-to-face time personally spent by the physician and/or other qualified health care professional(s) on the day of the encounter (includes time in activities that require the physician or other qualified health care professional and does not include time in activities normally performed by clinical staff). Physician's time may include the following activities when performed: preparing to see the patient (eg, review of tests, pre-charting review of records) obtaining and/or reviewing separately obtained history performing a medically appropriate examination and/or evaluation counseling and educating the patient/family/caregiver ordering medications, tests, or procedures referring and communicating with other health care professionals (when not separately reported) documenting clinical information in the electronic or other health record independently interpreting results (not separately reported) and communicating results to the patient/ family/caregiver care coordination (not separately reported)  Note by: Gaspar Cola, MD Date: 08/03/2021; Time: 11:46 AM

## 2021-08-03 ENCOUNTER — Ambulatory Visit: Payer: Worker's Compensation | Attending: Pain Medicine | Admitting: Pain Medicine

## 2021-08-03 ENCOUNTER — Encounter: Payer: Self-pay | Admitting: Pain Medicine

## 2021-08-03 DIAGNOSIS — Z79891 Long term (current) use of opiate analgesic: Secondary | ICD-10-CM | POA: Diagnosis not present

## 2021-08-03 DIAGNOSIS — Z79899 Other long term (current) drug therapy: Secondary | ICD-10-CM | POA: Diagnosis not present

## 2021-08-03 DIAGNOSIS — M79605 Pain in left leg: Secondary | ICD-10-CM | POA: Insufficient documentation

## 2021-08-03 DIAGNOSIS — M5136 Other intervertebral disc degeneration, lumbar region: Secondary | ICD-10-CM | POA: Insufficient documentation

## 2021-08-03 DIAGNOSIS — M961 Postlaminectomy syndrome, not elsewhere classified: Secondary | ICD-10-CM | POA: Insufficient documentation

## 2021-08-03 DIAGNOSIS — G8929 Other chronic pain: Secondary | ICD-10-CM | POA: Insufficient documentation

## 2021-08-03 DIAGNOSIS — G894 Chronic pain syndrome: Secondary | ICD-10-CM | POA: Diagnosis not present

## 2021-08-03 DIAGNOSIS — M5442 Lumbago with sciatica, left side: Secondary | ICD-10-CM | POA: Insufficient documentation

## 2021-08-03 DIAGNOSIS — M5417 Radiculopathy, lumbosacral region: Secondary | ICD-10-CM | POA: Insufficient documentation

## 2021-08-03 MED ORDER — OXYCODONE HCL 10 MG PO TABS: 10.0000 mg | ORAL_TABLET | Freq: Four times a day (QID) | ORAL | 0 refills | Status: DC | PRN

## 2021-08-03 NOTE — Progress Notes (Signed)
Nursing Pain Medication Assessment:  Safety precautions to be maintained throughout the outpatient stay will include: orient to surroundings, keep bed in low position, maintain call bell within reach at all times, provide assistance with transfer out of bed and ambulation.  Medication Inspection Compliance: Pill count conducted under aseptic conditions, in front of the patient. Neither the pills nor the bottle was removed from the patient's sight at any time. Once count was completed pills were immediately returned to the patient in their original bottle.  Medication: Oxycodone IR Pill/Patch Count:  41 of 120 pills remain Pill/Patch Appearance: Markings consistent with prescribed medication Bottle Appearance: Standard pharmacy container. Clearly labeled. Filled Date: 06 / 08 / 2023 Last Medication intake:  Today

## 2021-08-03 NOTE — Patient Instructions (Signed)
____________________________________________________________________________________________  Medication Rules  Purpose: To inform patients, and their family members, of our rules and regulations.  Applies to: All patients receiving prescriptions (written or electronic).  Pharmacy of record: Pharmacy where electronic prescriptions will be sent. If written prescriptions are taken to a different pharmacy, please inform the nursing staff. The pharmacy listed in the electronic medical record should be the one where you would like electronic prescriptions to be sent.  Electronic prescriptions: In compliance with the [place] Strengthen Opioid Misuse Prevention (STOP) Act of 2017 (Session Law 2017-74/H243), effective February 06, 2018, all controlled substances must be electronically prescribed. Calling prescriptions to the pharmacy will cease to exist.  Prescription refills: Only during scheduled appointments. Applies to all prescriptions.  NOTE: The following applies primarily to controlled substances (Opioid* Pain Medications).   Type of encounter (visit): For patients receiving controlled substances, face-to-face visits are required. (Not an option or up to the patient.)  Patient's responsibilities: Pain Pills: Bring all pain pills to every appointment (except for procedure appointments). Pill Bottles: Bring pills in original pharmacy bottle. Always bring the newest bottle. Bring bottle, even if empty. Medication refills: You are responsible for knowing and keeping track of what medications you take and those you need refilled. The day before your appointment: write a list of all prescriptions that need to be refilled. The day of the appointment: give the list to the admitting nurse. Prescriptions will be written only during appointments. No prescriptions will be written on procedure days. If you forget a medication: it will not be "Called in", "Faxed", or "electronically sent". You will  need to get another appointment to get these prescribed. No early refills. Do not call asking to have your prescription filled early. Prescription Accuracy: You are responsible for carefully inspecting your prescriptions before leaving our office. Have the discharge nurse carefully go over each prescription with you, before taking them home. Make sure that your name is accurately spelled, that your address is correct. Check the name and dose of your medication to make sure it is accurate. Check the number of pills, and the written instructions to make sure they are clear and accurate. Make sure that you are given enough medication to last until your next medication refill appointment. Taking Medication: Take medication as prescribed. When it comes to controlled substances, taking less pills or less frequently than prescribed is permitted and encouraged. Never take more pills than instructed. Never take medication more frequently than prescribed.  Inform other Doctors: Always inform, all of your healthcare providers, of all the medications you take. Pain Medication from other Providers: You are not allowed to accept any additional pain medication from any other Doctor or Healthcare provider. There are two exceptions to this rule. (see below) In the event that you require additional pain medication, you are responsible for notifying us, as stated below. Cough Medicine: Often these contain an opioid, such as codeine or hydrocodone. Never accept or take cough medicine containing these opioids if you are already taking an opioid* medication. The combination may cause respiratory failure and death. Medication Agreement: You are responsible for carefully reading and following our Medication Agreement. This must be signed before receiving any prescriptions from our practice. Safely store a copy of your signed Agreement. Violations to the Agreement will result in no further prescriptions. (Additional copies of our  Medication Agreement are available upon request.) Laws, Rules, & Regulations: All patients are expected to follow all Federal and State Laws, Statutes, Rules, & Regulations. Ignorance of   the Laws does not constitute a valid excuse.  Illegal drugs and Controlled Substances: The use of illegal substances (including, but not limited to marijuana and its derivatives) and/or the illegal use of any controlled substances is strictly prohibited. Violation of this rule may result in the immediate and permanent discontinuation of any and all prescriptions being written by our practice. The use of any illegal substances is prohibited. Adopted CDC guidelines & recommendations: Target dosing levels will be at or below 60 MME/day. Use of benzodiazepines** is not recommended.  Exceptions: There are only two exceptions to the rule of not receiving pain medications from other Healthcare Providers. Exception #1 (Emergencies): In the event of an emergency (i.e.: accident requiring emergency care), you are allowed to receive additional pain medication. However, you are responsible for: As soon as you are able, call our office [phone], at any time of the day or night, and leave a message stating your name, the date and nature of the emergency, and the name and dose of the medication prescribed. In the event that your call is answered by a member of our staff, make sure to document and save the date, time, and the name of the person that took your information.  Exception #2 (Planned Surgery): In the event that you are scheduled by another doctor or dentist to have any type of surgery or procedure, you are allowed (for a period no longer than 30 days), to receive additional pain medication, for the acute post-op pain. However, in this case, you are responsible for picking up a copy of our "Post-op Pain Management for Surgeons" handout, and giving it to your surgeon or dentist. This document is available at our office, and  does not require an appointment to obtain it. Simply go to our office during business hours (Monday-Thursday from 8:00 AM to 4:00 PM) (Friday 8:00 AM to 12:00 Noon) or if you have a scheduled appointment with us, prior to your surgery, and ask for it by name. In addition, you are responsible for: calling our office [phone], at any time of the day or night, and leaving a message stating your name, name of your surgeon, type of surgery, and date of procedure or surgery. Failure to comply with your responsibilities may result in termination of therapy involving the controlled substances. Medication Agreement Violation. Following the above rules, including your responsibilities will help you in avoiding a Medication Agreement Violation ("Breaking your Pain Medication Contract").  *Opioid medications include: morphine, codeine, oxycodone, oxymorphone, hydrocodone, hydromorphone, meperidine, tramadol, tapentadol, buprenorphine, fentanyl, methadone. **Benzodiazepine medications include: diazepam (Valium), alprazolam (Xanax), clonazepam (Klonopine), lorazepam (Ativan), clorazepate (Tranxene), chlordiazepoxide (Librium), estazolam (Prosom), oxazepam (Serax), temazepam (Restoril), triazolam (Halcion) (Last updated: 11/03/2020) ____________________________________________________________________________________________  ____________________________________________________________________________________________  Medication Recommendations and Reminders  Applies to: All patients receiving prescriptions (written and/or electronic).  Medication Rules & Regulations: These rules and regulations exist for your safety and that of others. They are not flexible and neither are we. Dismissing or ignoring them will be considered "non-compliance" with medication therapy, resulting in complete and irreversible termination of such therapy. (See document titled "Medication Rules" for more details.) In all conscience,  because of safety reasons, we cannot continue providing a therapy where the patient does not follow instructions.  Pharmacy of record:  Definition: This is the pharmacy where your electronic prescriptions will be sent.  We do not endorse any particular pharmacy, however, we have experienced problems with [facility] not securing enough medication supply for the community. We do not restrict you   in your choice of pharmacy. However, once we write for your prescriptions, we will NOT be re-sending more prescriptions to fix restricted supply problems created by your pharmacy, or your insurance.  The pharmacy listed in the electronic medical record should be the one where you want electronic prescriptions to be sent. If you choose to change pharmacy, simply notify our nursing staff.  Recommendations: Keep all of your pain medications in a safe place, under lock and key, even if you live alone. We will NOT replace lost, stolen, or damaged medication. After you fill your prescription, take 1 week's worth of pills and put them away in a safe place. You should keep a separate, properly labeled bottle for this purpose. The remainder should be kept in the original bottle. Use this as your primary supply, until it runs out. Once it's gone, then you know that you have 1 week's worth of medicine, and it is time to come in for a prescription refill. If you do this correctly, it is unlikely that you will ever run out of medicine. To make sure that the above recommendation works, it is very important that you make sure your medication refill appointments are scheduled at least 1 week before you run out of medicine. To do this in an effective manner, make sure that you do not leave the office without scheduling your next medication management appointment. Always ask the nursing staff to show you in your prescription , when your medication will be running out. Then arrange for the receptionist to get you a return appointment,  at least 7 days before you run out of medicine. Do not wait until you have 1 or 2 pills left, to come in. This is very poor planning and does not take into consideration that we may need to cancel appointments due to bad weather, sickness, or emergencies affecting our staff. DO NOT ACCEPT A "Partial Fill": If for any reason your pharmacy does not have enough pills/tablets to completely fill or refill your prescription, do not allow for a "partial fill". The law allows the pharmacy to complete that prescription within 72 hours, without requiring a new prescription. If they do not fill the rest of your prescription within those 72 hours, you will need a separate prescription to fill the remaining amount, which we will NOT provide. If the reason for the partial fill is your insurance, you will need to talk to the pharmacist about payment alternatives for the remaining tablets, but again, DO NOT ACCEPT A PARTIAL FILL, unless you can trust your pharmacist to obtain the remainder of the pills within 72 hours.  Prescription refills and/or changes in medication(s):  Prescription refills, and/or changes in dose or medication, will be conducted only during scheduled medication management appointments. (Applies to both, written and electronic prescriptions.) No refills on procedure days. No medication will be changed or started on procedure days. No changes, adjustments, and/or refills will be conducted on a procedure day. Doing so will interfere with the diagnostic portion of the procedure. No phone refills. No medications will be "called into the pharmacy". No Fax refills. No weekend refills. No Holliday refills. No after hours refills.  Remember:  Business hours are:  Monday to Thursday 8:00 AM to 4:00 PM Provider's Schedule: Kirstine Jacquin, MD - Appointments are:  Medication management: Monday and Wednesday 8:00 AM to 4:00 PM Procedure day: Tuesday and Thursday 7:30 AM to 4:00 PM Bilal Lateef, MD -  Appointments are:  Medication management: Tuesday and Thursday 8:00   AM to 4:00 PM Procedure day: Monday and Wednesday 7:30 AM to 4:00 PM (Last update: 08/27/2019) ____________________________________________________________________________________________  ____________________________________________________________________________________________  CBD (cannabidiol) & Delta-8 (Delta-8 tetrahydrocannabinol) WARNING  Intro: Cannabidiol (CBD) and tetrahydrocannabinol (THC), are two natural compounds found in plants of the Cannabis genus. They can both be extracted from hemp or cannabis. Hemp and cannabis come from the Cannabis sativa plant. Both compounds interact with your body's endocannabinoid system, but they have very different effects. CBD does not produce the high sensation associated with cannabis. Delta-8 tetrahydrocannabinol, also known as delta-8 THC, is a psychoactive substance found in the Cannabis sativa plant, of which marijuana and hemp are two varieties. THC is responsible for the high associated with the illicit use of marijuana.  Applicable to: All individuals currently taking or considering taking CBD (cannabidiol) and, more important, all patients taking opioid analgesic controlled substances (pain medication). (Example: oxycodone; oxymorphone; hydrocodone; hydromorphone; morphine; methadone; tramadol; tapentadol; fentanyl; buprenorphine; butorphanol; dextromethorphan; meperidine; codeine; etc.)  Legal status: CBD remains a Schedule I drug prohibited for any use. CBD is illegal with one exception. In the [place], CBD has a limited Food and Drug Administration (FDA) approval for the treatment of two specific types of epilepsy disorders. Only one CBD product has been approved by the FDA for this purpose: "Epidiolex". FDA is aware that some companies are marketing products containing cannabis and cannabis-derived compounds in ways that violate the Federal Food, Drug and Cosmetic Act  (FD&C Act) and that may put the health and safety of consumers at risk. The FDA, a Federal agency, has not enforced the CBD status since 2018. UPDATE: (03/25/2021) The Drug Enforcement Agency (DEA) issued a letter stating that "delta" cannabinoids, including Delta-8-THCO and Delta-9-THCO, synthetically derived from hemp do not qualify as hemp and will be viewed as Schedule I drugs. (Schedule I drugs, substances, or chemicals are defined as drugs with no currently accepted medical use and a high potential for abuse. Some examples of Schedule I drugs are: heroin, lysergic acid diethylamide (LSD), marijuana (cannabis), 3,4-methylenedioxymethamphetamine (ecstasy), methaqualone, and peyote.) (https://www.dea.gov)  Legality: Some manufacturers ship CBD products nationally, which is illegal. Often such products are sold online and are therefore available throughout the country. CBD is openly sold in head shops and health food stores in some states where such sales have not been explicitly legalized. Selling unapproved products with unsubstantiated therapeutic claims is not only a violation of the law, but also can put patients at risk, as these products have not been proven to be safe or effective. Federal illegality makes it difficult to conduct research on CBD.  Reference: "FDA Regulation of Cannabis and Cannabis-Derived Products, Including Cannabidiol (CBD)" - https://www.fda.gov/news-events/public-health-focus/fda-regulation-cannabis-and-cannabis-derived-products-including-cannabidiol-cbd  Warning: CBD is not FDA approved and has not undergo the same manufacturing controls as prescription drugs.  This means that the purity and safety of available CBD may be questionable. Most of the time, despite manufacturer's claims, it is contaminated with THC (delta-9-tetrahydrocannabinol - the chemical in marijuana responsible for the "HIGH").  When this is the case, the THC contaminant will trigger a positive urine drug  screen (UDS) test for Marijuana (carboxy-THC). Because a positive UDS for any illicit substance is a violation of our medication agreement, your opioid analgesics (pain medicine) may be permanently discontinued. The FDA recently put out a warning about 5 things that everyone should be aware of regarding Delta-8 THC: Delta-8 THC products have not been evaluated or approved by the FDA for safe use and may be marketed in ways that put the   public health at risk. The FDA has received adverse event reports involving delta-8 THC-containing products. Delta-8 THC has psychoactive and intoxicating effects. Delta-8 THC manufacturing often involve use of potentially harmful chemicals to create the concentrations of delta-8 THC claimed in the marketplace. The final delta-8 THC product may have potentially harmful by-products (contaminants) due to the chemicals used in the process. Manufacturing of delta-8 THC products may occur in uncontrolled or unsanitary settings, which may lead to the presence of unsafe contaminants or other potentially harmful substances. Delta-8 THC products should be kept out of the reach of children and pets.  MORE ABOUT CBD  General Information: CBD was discovered in 1940 and it is a derivative of the cannabis sativa genus plants (Marijuana and Hemp). It is one of the 113 identified substances found in Marijuana. It accounts for up to 40% of the plant's extract. As of 2018, preliminary clinical studies on CBD included research for the treatment of anxiety, movement disorders, and pain. CBD is available and consumed in multiple forms, including inhalation of smoke or vapor, as an aerosol spray, and by mouth. It may be supplied as an oil containing CBD, capsules, dried cannabis, or as a liquid solution. CBD is thought not to be as psychoactive as THC (delta-9-tetrahydrocannabinol - the chemical in marijuana responsible for the "HIGH"). Studies suggest that CBD may interact with different  biological target receptors in the body, including cannabinoid and other neurotransmitter receptors. As of 2018 the mechanism of action for its biological effects has not been determined.  Side-effects  Adverse reactions: Dry mouth, diarrhea, decreased appetite, fatigue, drowsiness, malaise, weakness, sleep disturbances, and others.  Drug interactions: CBC may interact with other medications such as blood-thinners. Because CBD causes drowsiness on its own, it also increases the drowsiness caused by other medications, including antihistamines (such as Benadryl), benzodiazepines (Xanax, Ativan, Valium), antipsychotics, antidepressants and opioids, as well as alcohol and supplements such as kava, melatonin and St. John's Wort. Be cautious with the following combinations:   Brivaracetam (Briviact) Brivaracetam is changed and broken down by the body. CBD might decrease how quickly the body breaks down brivaracetam. This might increase levels of brivaracetam in the body.  Caffeine Caffeine is changed and broken down by the body. CBD might decrease how quickly the body breaks down caffeine. This might increase levels of caffeine in the body.  Carbamazepine (Tegretol) Carbamazepine is changed and broken down by the body. CBD might decrease how quickly the body breaks down carbamazepine. This might increase levels of carbamazepine in the body and increase its side effects.  Citalopram (Celexa) Citalopram is changed and broken down by the body. CBD might decrease how quickly the body breaks down citalopram. This might increase levels of citalopram in the body and increase its side effects.  Clobazam (Onfi) Clobazam is changed and broken down by the liver. CBD might decrease how quickly the liver breaks down clobazam. This might increase the effects and side effects of clobazam.  Eslicarbazepine (Aptiom) Eslicarbazepine is changed and broken down by the body. CBD might decrease how quickly the body  breaks down eslicarbazepine. This might increase levels of eslicarbazepine in the body by a small amount.  Everolimus (Zostress) Everolimus is changed and broken down by the body. CBD might decrease how quickly the body breaks down everolimus. This might increase levels of everolimus in the body.  Lithium Taking higher doses of CBD might increase levels of lithium. This can increase the risk of lithium toxicity.  Medications changed by the liver (  Cytochrome P450 1A1 (CYP1A1) substrates) Some medications are changed and broken down by the liver. CBD might change how quickly the liver breaks down these medications. This could change the effects and side effects of these medications.  Medications changed by the liver (Cytochrome P450 1A2 (CYP1A2) substrates) Some medications are changed and broken down by the liver. CBD might change how quickly the liver breaks down these medications. This could change the effects and side effects of these medications.  Medications changed by the liver (Cytochrome P450 1B1 (CYP1B1) substrates) Some medications are changed and broken down by the liver. CBD might change how quickly the liver breaks down these medications. This could change the effects and side effects of these medications.  Medications changed by the liver (Cytochrome P450 2A6 (CYP2A6) substrates) Some medications are changed and broken down by the liver. CBD might change how quickly the liver breaks down these medications. This could change the effects and side effects of these medications.  Medications changed by the liver (Cytochrome P450 2B6 (CYP2B6) substrates) Some medications are changed and broken down by the liver. CBD might change how quickly the liver breaks down these medications. This could change the effects and side effects of these medications.  Medications changed by the liver (Cytochrome P450 2C19 (CYP2C19) substrates) Some medications are changed and broken down by the liver.  CBD might change how quickly the liver breaks down these medications. This could change the effects and side effects of these medications.  Medications changed by the liver (Cytochrome P450 2C8 (CYP2C8) substrates) Some medications are changed and broken down by the liver. CBD might change how quickly the liver breaks down these medications. This could change the effects and side effects of these medications.  Medications changed by the liver (Cytochrome P450 2C9 (CYP2C9) substrates) Some medications are changed and broken down by the liver. CBD might change how quickly the liver breaks down these medications. This could change the effects and side effects of these medications.  Medications changed by the liver (Cytochrome P450 2D6 (CYP2D6) substrates) Some medications are changed and broken down by the liver. CBD might change how quickly the liver breaks down these medications. This could change the effects and side effects of these medications.  Medications changed by the liver (Cytochrome P450 2E1 (CYP2E1) substrates) Some medications are changed and broken down by the liver. CBD might change how quickly the liver breaks down these medications. This could change the effects and side effects of these medications.  Medications changed by the liver (Cytochrome P450 3A4 (CYP3A4) substrates) Some medications are changed and broken down by the liver. CBD might change how quickly the liver breaks down these medications. This could change the effects and side effects of these medications.  Medications changed by the liver (Glucuronidated drugs) Some medications are changed and broken down by the liver. CBD might change how quickly the liver breaks down these medications. This could change the effects and side effects of these medications.  Medications that decrease the breakdown of other medications by the liver (Cytochrome P450 2C19 (CYP2C19) inhibitors) CBD is changed and broken down by the liver.  Some drugs decrease how quickly the liver changes and breaks down CBD. This could change the effects and side effects of CBD.  Medications that decrease the breakdown of other medications in the liver (Cytochrome P450 3A4 (CYP3A4) inhibitors) CBD is changed and broken down by the liver. Some drugs decrease how quickly the liver changes and breaks down CBD. This could change the effects   and side effects of CBD.  Medications that increase breakdown of other medications by the liver (Cytochrome P450 3A4 (CYP3A4) inducers) CBD is changed and broken down by the liver. Some drugs increase how quickly the liver changes and breaks down CBD. This could change the effects and side effects of CBD.  Medications that increase the breakdown of other medications by the liver (Cytochrome P450 2C19 (CYP2C19) inducers) CBD is changed and broken down by the liver. Some drugs increase how quickly the liver changes and breaks down CBD. This could change the effects and side effects of CBD.  Methadone (Dolophine) Methadone is broken down by the liver. CBD might decrease how quickly the liver breaks down methadone. Taking cannabidiol along with methadone might increase the effects and side effects of methadone.  Rufinamide (Banzel) Rufinamide is changed and broken down by the body. CBD might decrease how quickly the body breaks down rufinamide. This might increase levels of rufinamide in the body by a small amount.  Sedative medications (CNS depressants) CBD might cause sleepiness and slowed breathing. Some medications, called sedatives, can also cause sleepiness and slowed breathing. Taking CBD with sedative medications might cause breathing problems and/or too much sleepiness.  Sirolimus (Rapamune) Sirolimus is changed and broken down by the body. CBD might decrease how quickly the body breaks down sirolimus. This might increase levels of sirolimus in the body.  Stiripentol (Diacomit) Stiripentol is changed and  broken down by the body. CBD might decrease how quickly the body breaks down stiripentol. This might increase levels of stiripentol in the body and increase its side effects.  Tacrolimus (Prograf) Tacrolimus is changed and broken down by the body. CBD might decrease how quickly the body breaks down tacrolimus. This might increase levels of tacrolimus in the body.  Tamoxifen (Soltamox) Tamoxifen is changed and broken down by the body. CBD might affect how quickly the body breaks down tamoxifen. This might affect levels of tamoxifen in the body.  Topiramate (Topamax) Topiramate is changed and broken down by the body. CBD might decrease how quickly the body breaks down topiramate. This might increase levels of topiramate in the body by a small amount.  Valproate Valproic acid can cause liver injury. Taking cannabidiol with valproic acid might increase the chance of liver injury. CBD and/or valproic acid might need to be stopped, or the dose might need to be reduced.  Warfarin (Coumadin) CBD might increase levels of warfarin, which can increase the risk for bleeding. CBD and/or warfarin might need to be stopped, or the dose might need to be reduced.  Zonisamide Zonisamide is changed and broken down by the body. CBD might decrease how quickly the body breaks down zonisamide. This might increase levels of zonisamide in the body by a small amount. (Last update: 04/06/2021) ____________________________________________________________________________________________  ____________________________________________________________________________________________  Drug Holidays (Slow)  What is a "Drug Holiday"? Drug Holiday: is the name given to the period of time during which a patient stops taking a medication(s) for the purpose of eliminating tolerance to the drug.  Benefits Improved effectiveness of opioids. Decreased opioid dose needed to achieve benefits. Improved pain with lesser  dose.  What is tolerance? Tolerance: is the progressive decreased in effectiveness of a drug due to its repetitive use. With repetitive use, the body gets use to the medication and as a consequence, it loses its effectiveness. This is a common problem seen with opioid pain medications. As a result, a larger dose of the drug is needed to achieve the same effect that   used to be obtained with a smaller dose.  How long should a "Drug Holiday" last? You should stay off of the pain medicine for at least 14 consecutive days. (2 weeks)  Should I stop the medicine "cold turkey"? No. You should always coordinate with your Pain Specialist so that he/she can provide you with the correct medication dose to make the transition as smoothly as possible.  How do I stop the medicine? Slowly. You will be instructed to decrease the daily amount of pills that you take by one (1) pill every seven (7) days. This is called a "slow downward taper" of your dose. For example: if you normally take four (4) pills per day, you will be asked to drop this dose to three (3) pills per day for seven (7) days, then to two (2) pills per day for seven (7) days, then to one (1) per day for seven (7) days, and at the end of those last seven (7) days, this is when the "Drug Holiday" would start.   Will I have withdrawals? By doing a "slow downward taper" like this one, it is unlikely that you will experience any significant withdrawal symptoms. Typically, what triggers withdrawals is the sudden stop of a high dose opioid therapy. Withdrawals can usually be avoided by slowly decreasing the dose over a prolonged period of time. If you do not follow these instructions and decide to stop your medication abruptly, withdrawals may be possible.  What are withdrawals? Withdrawals: refers to the wide range of symptoms that occur after stopping or dramatically reducing opiate drugs after heavy and prolonged use. Withdrawal symptoms do not occur to  patients that use low dose opioids, or those who take the medication sporadically. Contrary to benzodiazepine (example: Valium, Xanax, etc.) or alcohol withdrawals ("Delirium Tremens"), opioid withdrawals are not lethal. Withdrawals are the physical manifestation of the body getting rid of the excess receptors.  Expected Symptoms Early symptoms of withdrawal may include: Agitation Anxiety Muscle aches Increased tearing Insomnia Runny nose Sweating Yawning  Late symptoms of withdrawal may include: Abdominal cramping Diarrhea Dilated pupils Goose bumps Nausea Vomiting  Will I experience withdrawals? Due to the slow nature of the taper, it is very unlikely that you will experience any.  What is a slow taper? Taper: refers to the gradual decrease in dose.  (Last update: 08/27/2019) ____________________________________________________________________________________________   ____________________________________________________________________________________________  Pharmacy Shortages of Pain Medication   Introduction Shockingly as it may seem, .  "No [place] Supreme Court decision has ever interpreted the Constitution as guaranteeing a right to health care for all Americans." - https://www.healthequityandpolicylab.com/elusive-right-to-health-care-under-us-law  "With respect to human rights, the [place] has no formally codified right to health, nor does it participate in a human rights treaty that specifies a right to health." - Scott J. Schweikart, JD, MBE  Situation By now, most of our patients have had the experience of being told by their pharmacist that they do not have enough medication to cover their prescription. If you have not had this experience, just know that you soon will.  Problem There appears to be a shortage of these medications, either at the national level or locally. This is happening with all pharmacies. When there is not enough medication, patients  are offered a partial fill and they are told that they will try to get the rest of the medicine for them at a later time. If they do not have enough for even a partial fill, the pharmacists are telling the patients to   call us (the prescribing physicians) to request that we send another prescription to another pharmacy to get the medicine.   This reordering of a controlled substance creates documentation problems where additional paperwork needs to be created to explain why two prescriptions for the same period of time and the same medicine are being prescribed to the same patient. It also creates situations where the last appointment note does not accurately reflect when and what prescriptions were given to a patient. This leads to prescribing errors down the line, in subsequent follow-up visits.   [place] Board [employer]acy ([place]) Research revealed that Board of Pharmacy Rule .1806 (21 NCAC 46.1806) authorizes pharmacists to the transfer of prescriptions among pharmacies, and it sets forth procedural and recordkeeping requirements for doing so. However, this requires the pharmacist to complete the previously mentioned procedural paperwork to accomplish the transfer. As it turns out, it is much easier for them to have the prescribing physicians do the work.   Possible solutions 1. Have the [place] Assembly add a provision to the "STOP ACT" (the law that mandates how controlled substances are prescribed) where there is an exception to the electronic prescribing rule that states that in the event there are shortages of medications the physicians are allowed to use written prescriptions as opposed to electronic ones. This would allow patients to take their prescriptions to a different pharmacy that may have enough medication available to fill the prescription. The problem is that currently there is a law that does not allow for written prescriptions, with the exception of instances where the  electronic medical record is down due to technical issues.  2. Have [place] Congress ease the pressure on pharmaceutical companies, allowing them to produce enough quantities of the medication to adequately supply the population. 3. Have pharmacies keep enough stocks of these medications to cover their client base.  4. Have the [place] Assembly add a provision to the "STOP ACT" where they ease the regulations surrounding the transfer of controlled substances between pharmacies, so as to simplify the transfer of supplies. As an alternative, develop a system to allow patients to obtain the remainder of their prescription at another one of their pharmacies or at an associate pharmacy.   How this shortage will affect you.  The one thing that is abundantly clear is that this is a pharmacy supply problem  and not a prescriber problem. The job of the prescriber is to evaluate and monitor the patients for the appropriate indications to the use of these medicines, the monitoring of their use and the prescribing of the appropriate dose and regimen. It is not the job of the prescriber to provide or dispense the actual medication. By law, this is the job of the pharmacies and pharmacists. It is certainly not the job of the prescriber to solve the supply problems.   Due to the above problems we are no longer taking patients to write for their pain medication. We will continue to evaluate for appropriate indications and we may provide recommendations regarding medication, dose, and schedule, as well as monitoring recommendations, however, we will not be taking over the actual prescribing of these substances. On those patients where we are treating their chronic pain with interventional therapies, exceptions will be considered on a case by case basis. At this time, we will try to continue providing this supplemental service to those patients we have been managing in the past. However, as of August 1st, 2023, we no  longer   will be sending additional prescriptions to other pharmacies for the purpose of solving their supply problems. Once we send a prescription to a pharmacy, we will not be resending it again to another pharmacy to cover for their shortages.   What to do. Write as many letters as you can. Recruit the help of family members in writing these letters. Below are some of the places where you can write to make your voice heard. Let them know what the problem is and push them to look for solutions.   Search internet for: "[place] find your legislators" https://www.ncleg.gov/findyourlegislators  Search internet for: "[place] insurance commissioner complaints" https://www.ncdoi.gov/contactscomplaints/assistance-or-file-complaint  Search internet for: "[place] Board of Pharmacy complaints" http://www.ncbop.org/contact.htm  Search internet for: "[facility] pharmacy complaints" Email [facility] Customer [facility] https://www.[facility].com/help/email-[email or web address].jsp?callType=store  Search internet for: "[facility] pharmacy customer service complaints" https://www.[facility].com/topic/marketing/contactus/contactus_customerservice.jsp  ____________________________________________________________________________________________   

## 2021-08-04 DIAGNOSIS — Z79899 Other long term (current) drug therapy: Secondary | ICD-10-CM | POA: Diagnosis not present

## 2021-08-04 DIAGNOSIS — E291 Testicular hypofunction: Secondary | ICD-10-CM | POA: Diagnosis not present

## 2021-08-04 DIAGNOSIS — N401 Enlarged prostate with lower urinary tract symptoms: Secondary | ICD-10-CM | POA: Diagnosis not present

## 2021-08-05 DIAGNOSIS — K219 Gastro-esophageal reflux disease without esophagitis: Secondary | ICD-10-CM | POA: Diagnosis not present

## 2021-08-05 DIAGNOSIS — I10 Essential (primary) hypertension: Secondary | ICD-10-CM | POA: Diagnosis not present

## 2021-08-05 DIAGNOSIS — E785 Hyperlipidemia, unspecified: Secondary | ICD-10-CM | POA: Diagnosis not present

## 2021-08-07 LAB — TOXASSURE SELECT 13 (MW), URINE

## 2021-09-01 DIAGNOSIS — I10 Essential (primary) hypertension: Secondary | ICD-10-CM | POA: Diagnosis not present

## 2021-09-06 DIAGNOSIS — E119 Type 2 diabetes mellitus without complications: Secondary | ICD-10-CM | POA: Diagnosis not present

## 2021-09-06 DIAGNOSIS — H353131 Nonexudative age-related macular degeneration, bilateral, early dry stage: Secondary | ICD-10-CM | POA: Diagnosis not present

## 2021-09-21 DIAGNOSIS — H524 Presbyopia: Secondary | ICD-10-CM | POA: Diagnosis not present

## 2021-10-06 DIAGNOSIS — K219 Gastro-esophageal reflux disease without esophagitis: Secondary | ICD-10-CM | POA: Diagnosis not present

## 2021-10-06 DIAGNOSIS — I10 Essential (primary) hypertension: Secondary | ICD-10-CM | POA: Diagnosis not present

## 2021-10-06 DIAGNOSIS — E785 Hyperlipidemia, unspecified: Secondary | ICD-10-CM | POA: Diagnosis not present

## 2021-10-11 DIAGNOSIS — H524 Presbyopia: Secondary | ICD-10-CM | POA: Diagnosis not present

## 2021-10-17 DIAGNOSIS — R7303 Prediabetes: Secondary | ICD-10-CM | POA: Diagnosis not present

## 2021-10-17 DIAGNOSIS — E039 Hypothyroidism, unspecified: Secondary | ICD-10-CM | POA: Diagnosis not present

## 2021-10-17 DIAGNOSIS — I2510 Atherosclerotic heart disease of native coronary artery without angina pectoris: Secondary | ICD-10-CM | POA: Diagnosis not present

## 2021-10-17 DIAGNOSIS — R0602 Shortness of breath: Secondary | ICD-10-CM | POA: Diagnosis not present

## 2021-10-17 DIAGNOSIS — R69 Illness, unspecified: Secondary | ICD-10-CM | POA: Diagnosis not present

## 2021-10-17 DIAGNOSIS — I10 Essential (primary) hypertension: Secondary | ICD-10-CM | POA: Diagnosis not present

## 2021-10-17 DIAGNOSIS — E559 Vitamin D deficiency, unspecified: Secondary | ICD-10-CM | POA: Diagnosis not present

## 2021-10-17 DIAGNOSIS — K2100 Gastro-esophageal reflux disease with esophagitis, without bleeding: Secondary | ICD-10-CM | POA: Diagnosis not present

## 2021-10-17 DIAGNOSIS — E785 Hyperlipidemia, unspecified: Secondary | ICD-10-CM | POA: Diagnosis not present

## 2021-11-02 DIAGNOSIS — E785 Hyperlipidemia, unspecified: Secondary | ICD-10-CM | POA: Diagnosis not present

## 2021-11-02 DIAGNOSIS — E559 Vitamin D deficiency, unspecified: Secondary | ICD-10-CM | POA: Diagnosis not present

## 2021-11-02 DIAGNOSIS — E039 Hypothyroidism, unspecified: Secondary | ICD-10-CM | POA: Diagnosis not present

## 2021-11-02 DIAGNOSIS — R7303 Prediabetes: Secondary | ICD-10-CM | POA: Diagnosis not present

## 2021-11-07 ENCOUNTER — Encounter: Payer: PRIVATE HEALTH INSURANCE | Admitting: Pain Medicine

## 2021-11-08 ENCOUNTER — Ambulatory Visit: Payer: Worker's Compensation | Attending: Pain Medicine | Admitting: Pain Medicine

## 2021-11-08 ENCOUNTER — Encounter: Payer: Self-pay | Admitting: Pain Medicine

## 2021-11-08 VITALS — BP 125/59 | HR 70 | Temp 97.9°F | Resp 16 | Ht 72.0 in | Wt 190.0 lb

## 2021-11-08 DIAGNOSIS — M961 Postlaminectomy syndrome, not elsewhere classified: Secondary | ICD-10-CM | POA: Diagnosis not present

## 2021-11-08 DIAGNOSIS — G8929 Other chronic pain: Secondary | ICD-10-CM | POA: Insufficient documentation

## 2021-11-08 DIAGNOSIS — M5442 Lumbago with sciatica, left side: Secondary | ICD-10-CM | POA: Insufficient documentation

## 2021-11-08 DIAGNOSIS — M79605 Pain in left leg: Secondary | ICD-10-CM | POA: Insufficient documentation

## 2021-11-08 DIAGNOSIS — G894 Chronic pain syndrome: Secondary | ICD-10-CM | POA: Insufficient documentation

## 2021-11-08 DIAGNOSIS — M5417 Radiculopathy, lumbosacral region: Secondary | ICD-10-CM | POA: Diagnosis present

## 2021-11-08 DIAGNOSIS — Z79899 Other long term (current) drug therapy: Secondary | ICD-10-CM | POA: Diagnosis present

## 2021-11-08 DIAGNOSIS — Z79891 Long term (current) use of opiate analgesic: Secondary | ICD-10-CM | POA: Diagnosis present

## 2021-11-08 DIAGNOSIS — M5136 Other intervertebral disc degeneration, lumbar region: Secondary | ICD-10-CM | POA: Insufficient documentation

## 2021-11-08 MED ORDER — OXYCODONE HCL 10 MG PO TABS: 10.0000 mg | ORAL_TABLET | Freq: Four times a day (QID) | ORAL | 0 refills | Status: DC | PRN

## 2021-11-08 NOTE — Progress Notes (Signed)
PROVIDER NOTE: Information contained herein reflects review and annotations entered in association with encounter. Interpretation of such information and data should be left to medically-trained personnel. Information provided to patient can be located elsewhere in the medical record under "Patient Instructions". Document created using STT-dictation technology, any transcriptional errors that may result from process are unintentional.    Patient: Chase Mendez  Service Category: E/M  Provider: Gaspar Cola, MD  DOB: 01/12/1935  DOS: 11/08/2021  Referring Provider: Jodi Marble, MD  MRN: [id number]  Specialty: Interventional Pain Management  PCP: Jodi Marble, MD  Type: Established Patient  Setting: Ambulatory outpatient    Location: Office  Delivery: Face-to-face     HPI  [name], a 87 y.o. year old male, is here today because of his Chronic pain syndrome [G89.4]. Chase Mendez primary complain today is Back Pain (Left, lower) Last encounter: My last encounter with him was on 08/03/2021. Pertinent problems: Chase Mendez has Spinal accessory neuropathy; Lower limb pain, inferior (L5) (Foot Drop) (Left); Chronic low back pain (2ry area of Pain) (Bilateral) (ML) (L>R) w/ sciatica (Left); Chronic lower extremity pain (1ry area of Pain) (Left); Failed back surgical syndrome; Foot drop (Left); Lumbosacral radiculopathy at L5 (Left); DDD (degenerative disc disease), lumbar; Chronic pain syndrome; Thoracic central spinal stenosis; Chronic musculoskeletal pain; Neurogenic pain; Presence of neurostimulator; Gout of left foot due to renal impairment; Disturbance of skin sensation; Cervicalgia; Cervical facet syndrome; and Bilateral lower abdominal pain on their pertinent problem list. Pain Assessment: Severity of Chronic pain is reported as a 6 /10. Location: Back Left, Lower/left leg to the foot. Onset: More than a month ago. Quality: Sharp. Timing: Intermittent. Modifying factor(s):  positioning, medications, SCS. Vitals:  height is 6' (1.829 m) and weight is 190 lb (86.2 kg). His temporal temperature is 97.9 F (36.6 C). His blood pressure is 125/59 (abnormal) and his pulse is 70. His respiration is 16 and oxygen saturation is 95%.   Reason for encounter: medication management.  The patient indicates doing well with the current medication regimen. No adverse reactions or side effects reported to the medications.   Today the patient comes in indicating that he has been having pain going all the way down to his big toe on the left side.  He does have a known left-sided L5 radiculopathy.  Today I have offered him a caudal epidural steroid injection which he has indicated that he is interested in having.  In addition to that, he is complaining of some neck pain and indicated that he may want to follow-up with his cervical epidural.  RTCB: 02/09/2022 Nonopioids transferred 01/12/2020: Gabapentin (Neurontin)  Pharmacotherapy Assessment  Analgesic: Oxycodone IR 10 mg, 1 tab PO q 6 hrs. (40 mg/day of oxycodone) MME/day: 60 mg/day.   Monitoring: [place] PMP: PDMP reviewed during this encounter.       Pharmacotherapy: No side-effects or adverse reactions reported. Compliance: No problems identified. Effectiveness: Clinically acceptable.  Landis Martins, RN  11/08/2021  1:06 PM  Sign when Signing Visit Nursing Pain Medication Assessment:  Safety precautions to be maintained throughout the outpatient stay will include: orient to surroundings, keep bed in low position, maintain call bell within reach at all times, provide assistance with transfer out of bed and ambulation.  Medication Inspection Compliance: Pill count conducted under aseptic conditions, in front of the patient. Neither the pills nor the bottle was removed from the patient's sight at any time. Once count was completed pills were immediately returned  to the patient in their original bottle.  Medication: Oxycodone  IR Pill/Patch Count:  12 of 120 pills remain Pill/Patch Appearance: Markings consistent with prescribed medication Bottle Appearance: Standard pharmacy container. Clearly labeled. Filled Date: 09 / 06 / 2023 Last Medication intake:  Today    No results found for: "CBDTHCR" No results found for: "D8THCCBX" No results found for: "D9THCCBX"  UDS:  Summary  Date Value Ref Range Status  08/03/2021 Note  Final    Comment:    ==================================================================== [facility] (MW) ==================================================================== Test                             Result       Flag       Units  Drug Present and Declared for Prescription Verification   Oxycodone                      3770         EXPECTED   ng/mg creat   Oxymorphone                    1689         EXPECTED   ng/mg creat   Noroxycodone                   4295         EXPECTED   ng/mg creat   Noroxymorphone                 909          EXPECTED   ng/mg creat    Sources of oxycodone are scheduled prescription medications.    Oxymorphone, noroxycodone, and noroxymorphone are expected    metabolites of oxycodone. Oxymorphone is also available as a    scheduled prescription medication.  ==================================================================== Test                      Result    Flag   Units      Ref Range   Creatinine              148              mg/dL      >=20 ==================================================================== Declared Medications:  The flagging and interpretation on this report are based on the  following declared medications.  Unexpected results may arise from  inaccuracies in the declared medications.   **Note: The testing scope of this panel includes these medications:   Oxycodone   **Note: The testing scope of this panel does not include the  following reported medications:   Allopurinol (Zyloprim)  Amlodipine (Norvasc)   Aspirin  Atorvastatin (Lipitor)  Cyanocobalamin  Diphenhydramine (Benadryl)  Duloxetine (Cymbalta)  Gabapentin (Neurontin)  Isosorbide (Imdur)  Levothyroxine (Synthroid)  Losartan (Cozaar)  Ondansetron (Zofran)  Pantoprazole (Protonix)  Tamsulosin (Flomax)  Testosterone  Vitamin E ==================================================================== For clinical consultation, please call [phone]. ====================================================================       ROS  Constitutional: Denies any fever or chills Gastrointestinal: No reported hemesis, hematochezia, vomiting, or acute GI distress Musculoskeletal: Denies any acute onset joint swelling, redness, loss of ROM, or weakness Neurological: No reported episodes of acute onset apraxia, aphasia, dysarthria, agnosia, amnesia, paralysis, loss of coordination, or loss of consciousness  Medication Review  DULoxetine, Oxycodone HCl, Testosterone, allopurinol, amLODipine, aspirin, atorvastatin, cariprazine, cyanocobalamin, diphenhydrAMINE, gabapentin, isosorbide mononitrate, levothyroxine, losartan, mirabegron ER, ondansetron, pantoprazole,  tamsulosin, and vitamin E  History Review  Allergy: Chase Mendez is allergic to doxycycline, ibuprofen, and sulfa antibiotics. Drug: Chase Mendez  reports current drug use. Drug: Oxycodone. Alcohol:  reports no history of alcohol use. Tobacco:  reports that he has been smoking cigars and cigarettes. He has a 35.00 pack-year smoking history. He has never used smokeless tobacco. Social: Chase Mendez  reports that he has been smoking cigars and cigarettes. He has a 35.00 pack-year smoking history. He has never used smokeless tobacco. He reports current drug use. Drug: Oxycodone. He reports that he does not drink alcohol. Medical:  has a past medical history of Acute encephalopathy (12/08/2014), Anxiety, ARF (acute renal failure) ([place]) (12/08/2014), Back pain, Benign neoplasm of large bowel,  Capsulitis, Chronic back pain, Coronavirus infection, Depression, Diabetes mellitus without complication ([place]), Dysphagia, Exostosis, Foot drop, left, Frequent falls (02/2018), GERD (gastroesophageal reflux disease), Gout, Hypertension, Hypothyroidism, Insomnia, Low testosterone, Microscopic hematuria (2016), Myocardial infarction ([place]), Pneumonia (12/07/2014), Pressure ulcer (12/09/2014), Sepsis ([place]) (12/08/2014), and Thyroid disease. Surgical: Chase Mendez  has a past surgical history that includes Cholecystectomy; Esophagogastroduodenoscopy (egd) with propofol (N/A, 04/24/2017); Back surgery (1995, 1996); Eye surgery (Bilateral, 1983, 1985); Appendectomy; and Spinal cord stimulator insertion (N/A, 03/13/2018). Family: family history includes Diabetes in his father; Heart disease in his mother; Kidney cancer in his brother.  Laboratory Chemistry Profile   Renal Lab Results  Component Value Date   BUN 17 01/28/2019   CREATININE 1.30 (H) 12/29/2019   GFRAA >60 01/28/2019   GFRNONAA >60 01/28/2019    Hepatic Lab Results  Component Value Date   AST 75 (H) 11/28/2018   ALT 20 11/28/2018   ALBUMIN 3.9 11/28/2018   ALKPHOS 134 (H) 11/28/2018   HCVAB <0.1 07/24/2018   LIPASE 37 08/21/2016    Electrolytes Lab Results  Component Value Date   NA 140 01/28/2019   K 4.0 01/28/2019   CL 105 01/28/2019   CALCIUM 8.9 01/28/2019    Bone No results found for: "VD25OH", "VD125OH2TOT", "UM3536RW4", "RX5400QQ7", "25OHVITD1", "25OHVITD2", "61PJKDTO6", "TESTOFREE", "TESTOSTERONE"  Inflammation (CRP: Acute Phase) (ESR: Chronic Phase) Lab Results  Component Value Date   LATICACIDVEN 1.1 12/08/2014         Note: Above Lab results reviewed.  Recent Imaging Review  CT ABDOMEN PELVIS W CONTRAST CLINICAL DATA:  Bilateral lower quadrant pain, chronic nausea. Constipation.  EXAM: CT ABDOMEN AND PELVIS WITH CONTRAST  TECHNIQUE: Multidetector CT imaging of the abdomen and pelvis was performed using the  standard protocol following bolus administration of intravenous contrast.  RADIATION DOSE REDUCTION: This exam was performed according to the departmental dose-optimization program which includes automated exposure control, adjustment of the mA and/or kV according to patient size and/or use of iterative reconstruction technique.  CONTRAST:  123m OMNIPAQUE IOHEXOL 300 MG/ML  SOLN  COMPARISON:  Ultrasound July 29, 2018 and CT April 12, 2009  FINDINGS: Lower chest: Bibasilar atelectasis versus scarring.  Hepatobiliary: Probable hepatic steatosis. No suspicious hepatic lesion. Gallbladder surgically absent. No biliary ductal dilation.  Pancreas: No pancreatic ductal dilation or evidence of acute inflammation.  Spleen: No splenomegaly. 5 mm splenic hypodensity is stable dating back to April 12, 2009 consistent with a benign entity.  Adrenals/Urinary Tract: Bilateral adrenal glands appear normal. No hydronephrosis. Nonobstructive 7 mm right renal stone. Bilateral cortical renal thinning with right upper pole cortical renal scarring. Right lower pole 11 mm partially exophytic renal lesion on image 39/2 is new from prior and demonstrates Hounsfield units slightly greater than that expected for  a simple cyst difficult to characterize given its small size and possibly reflecting a hemorrhagic/proteinaceous cyst or pseudo enhancement however renal neoplasm not excluded. Symmetric bilateral renal enhancement. Urinary bladder is unremarkable for degree of distension.  Stomach/Bowel: Radiopaque enteric contrast material traverses distal loops of small bowel. Stomach is distended without abnormal wall thickening. No pathologic dilation of large or small bowel. The terminal ileum and appendix appear normal. Moderate volume of formed stool throughout the colon. No evidence of acute bowel inflammation.  Vascular/Lymphatic: Aortic atherosclerosis without aneurysmal dilation of the abdominal  aorta. No pathologically enlarged abdominal or pelvic lymph nodes.  Reproductive: Prostate is unremarkable.  Other: No significant abdominopelvic free fluid. Fat in bilateral inguinal canals. Spinal stimulating generator in the left posterior subcutaneous soft tissues with generator lead extending into the thoracic spinal canal and tip obscured by collimation.  Musculoskeletal: Multilevel degenerative changes spine. Stippled sclerotic lesion in the left femoral head is stable dating back to 2011 consistent with a benign etiology. No acute osseous abnormality.  IMPRESSION: 1. No acute abnormality within the abdomen or pelvis. 2. Moderate volume of formed stool throughout the colon suggesting constipation. 3. Nonobstructive 7 mm right renal stone. 4. Right lower pole 11 mm partially exophytic renal lesion is new from prior and demonstrates Hounsfield units slightly greater than that expected for a simple cyst. While this may represent a hemorrhagic/proteinaceous cyst or pseudo enhancement however renal neoplasm not excluded. Consider further evaluation with renal ultrasound. 5. Probable hepatic steatosis. 6.  Aortic Atherosclerosis (ICD10-I70.0).  Electronically Signed   By: Dahlia Bailiff M.D.   On: 04/06/2021 14:50 Note: Reviewed        Physical Exam  General appearance: Well nourished, well developed, and well hydrated. In no apparent acute distress Mental status: Alert, oriented x 3 (person, place, & time)       Respiratory: No evidence of acute respiratory distress Eyes: PERLA Vitals: BP (!) 125/59   Pulse 70   Temp 97.9 F (36.6 C) (Temporal)   Resp 16   Ht 6' (1.829 m)   Wt 190 lb (86.2 kg)   SpO2 95%   BMI 25.77 kg/m  BMI: Estimated body mass index is 25.77 kg/m as calculated from the following:   Height as of this encounter: 6' (1.829 m).   Weight as of this encounter: 190 lb (86.2 kg). Ideal: Ideal body weight: 77.6 kg (171 lb 1.2 oz) Adjusted ideal body  weight: 81 kg (178 lb 10.3 oz)  Assessment   Diagnosis Status  1. Chronic pain syndrome   2. Chronic lower extremity pain (1ry area of Pain) (Left)   3. Chronic low back pain (2ry area of Pain) (Bilateral) (ML) (L>R) w/ sciatica (Left)   4. Failed back surgical syndrome   5. Lumbosacral radiculopathy at L5 (Left)   6. DDD (degenerative disc disease), lumbar   7. Pharmacologic therapy   8. Chronic use of opiate for therapeutic purpose   9. Encounter for medication management   10. Encounter for chronic pain management    Controlled Controlled Controlled   Updated Problems: No problems updated.   Plan of Care  Problem-specific:  No problem-specific Assessment & Plan notes found for this encounter.  Chase Mendez has a current medication list which includes the following long-term medication(s): allopurinol, amlodipine, isosorbide mononitrate, levothyroxine, losartan, [START ON 11/11/2021] oxycodone hcl, [START ON 12/11/2021] oxycodone hcl, [START ON 01/10/2022] oxycodone hcl, testosterone, gabapentin, pantoprazole, and [DISCONTINUED] diphenhydramine.  Pharmacotherapy (Medications Ordered): Meds ordered this encounter  Medications   Oxycodone HCl 10 MG TABS    Sig: Take 1 tablet (10 mg total) by mouth every 6 (six) hours as needed. Must last 30 days.    Dispense:  120 tablet    Refill:  0    DO NOT: delete (not duplicate); no partial-fill (will deny script to complete), no refill request (F/U required). DISPENSE: 1 day early if closed on fill date. WARN: No CNS-depressants within 8 hrs of med.   Oxycodone HCl 10 MG TABS    Sig: Take 1 tablet (10 mg total) by mouth every 6 (six) hours as needed. Must last 30 days.    Dispense:  120 tablet    Refill:  0    DO NOT: delete (not duplicate); no partial-fill (will deny script to complete), no refill request (F/U required). DISPENSE: 1 day early if closed on fill date. WARN: No CNS-depressants within 8 hrs of med.   Oxycodone HCl 10  MG TABS    Sig: Take 1 tablet (10 mg total) by mouth every 6 (six) hours as needed. Must last 30 days.    Dispense:  120 tablet    Refill:  0    DO NOT: delete (not duplicate); no partial-fill (will deny script to complete), no refill request (F/U required). DISPENSE: 1 day early if closed on fill date. WARN: No CNS-depressants within 8 hrs of med.   Orders:  Orders Placed This Encounter  Procedures   Caudal Epidural Injection    Standing Status:   Future    Standing Expiration Date:   02/08/2022    Scheduling Instructions:     Laterality: Left-sided     Level(s): Sacrococcygeal canal (Tailbone area)     Sedation: Patient's choice     Scheduling Timeframe: As soon as pre-approved    Order Specific Question:   Where will this procedure be performed?    Answer:   [facility]   Follow-up plan:   Return for procedure (ECT): (L) Caudal ESI + Epidurogram #1.     Interventional Therapies  Risk  Complexity Considerations:   Estimated body mass index is 26.04 kg/m as calculated from the following:   Height as of this encounter: 6' (1.829 m).   Weight as of this encounter: 192 lb (87.1 kg). WNL   Planned  Pending:   Diagnostic/therapeutic left caudal ESI + epidurogram #1  Diagnostic/therapeutic cervical ESI #1 (after caudal)   Under consideration:   Diagnostic Caudal ESI + epidurogram #1  Possible Racz procedure  Diagnostic bilateral lumbar facet MBB  Possible bilateral lumbar facet RFA  Possible candidate for intrathecal pump trial and implant    Completed:   None at this time   Therapeutic  Palliative (PRN) options:   Therapeutic/palliative left L4 TFESI #2  Therapeutic/palliative left L5 TFESI #2  Bilateral spinal cord stimulator trial (done - 01/17/2018)  Permanent bilateral spinal cord stimulator implant by Dr. Cari Caraway (neurosurgery) (done - 03/13/2018)    Recent Visits No visits were found meeting these conditions. Showing recent visits within past 90 days  and meeting all other requirements Today's Visits Date Type Provider Dept  11/08/21 Office Visit Milinda Pointer, MD [facility]  Showing today's visits and meeting all other requirements Future Appointments Date Type Provider Dept  11/17/21 Appointment Milinda Pointer, [facility]  02/01/22 Appointment Milinda Pointer, MD [facility]  Showing future appointments within next 90 days and meeting all other requirements  I discussed the assessment and treatment plan  with the patient. The patient was provided an opportunity to ask questions and all were answered. The patient agreed with the plan and demonstrated an understanding of the instructions.  Patient advised to call back or seek an in-person evaluation if the symptoms or condition worsens.  Duration of encounter: 30 minutes.  Total time on encounter, as per AMA guidelines included both the face-to-face and non-face-to-face time personally spent by the physician and/or other qualified health care professional(s) on the day of the encounter (includes time in activities that require the physician or other qualified health care professional and does not include time in activities normally performed by clinical staff). Physician's time may include the following activities when performed: preparing to see the patient (eg, review of tests, pre-charting review of records) obtaining and/or reviewing separately obtained history performing a medically appropriate examination and/or evaluation counseling and educating the patient/family/caregiver ordering medications, tests, or procedures referring and communicating with other health care professionals (when not separately reported) documenting clinical information in the electronic or other health record independently interpreting results (not separately reported) and communicating results to the patient/ family/caregiver care coordination (not separately  reported)  Note by: Gaspar Cola, MD Date: 11/08/2021; Time: 3:07 PM

## 2021-11-08 NOTE — Progress Notes (Signed)
Nursing Pain Medication Assessment:  Safety precautions to be maintained throughout the outpatient stay will include: orient to surroundings, keep bed in low position, maintain call bell within reach at all times, provide assistance with transfer out of bed and ambulation.  Medication Inspection Compliance: Pill count conducted under aseptic conditions, in front of the patient. Neither the pills nor the bottle was removed from the patient's sight at any time. Once count was completed pills were immediately returned to the patient in their original bottle.  Medication: Oxycodone IR Pill/Patch Count:  12 of 120 pills remain Pill/Patch Appearance: Markings consistent with prescribed medication Bottle Appearance: Standard pharmacy container. Clearly labeled. Filled Date: 09 / 06 / 2023 Last Medication intake:  Today

## 2021-11-08 NOTE — Patient Instructions (Addendum)
_Do not eat or drink for 8 hours Bring a driver Take blood pressure medicine the morning of the procedure with a small sip of water.  _____________________________________________________________________  Preparing for Procedure with Sedation  NOTICE: Due to recent regulatory changes, starting on September 06, 2020, procedures requiring intravenous (IV) sedation will no longer be performed at the [facility].  These types of procedures are required to be performed at [facility] ambulatory surgery facility.  We are very sorry for the inconvenience.  Procedure appointments are limited to planned procedures: No Prescription Refills. No disability issues will be discussed. No medication changes will be discussed.  Instructions: Oral Intake: Do not eat or drink anything for at least 8 hours prior to your procedure. (Exception: Blood Pressure Medication. See below.) Transportation: A driver is required. You may not drive yourself after the procedure. Blood Pressure Medicine: Do not forget to take your blood pressure medicine with a sip of water the morning of the procedure. If your Diastolic (lower reading) is above 100 mmHg, elective cases will be cancelled/rescheduled. Blood thinners: These will need to be stopped for procedures. Notify our staff if you are taking any blood thinners. Depending on which one you take, there will be specific instructions on how and when to stop it. Diabetics on insulin: Notify the staff so that you can be scheduled 1st case in the morning. If your diabetes requires high dose insulin, take only  of your normal insulin dose the morning of the procedure and notify the staff that you have done so. Preventing infections: Shower with an antibacterial soap the morning of your procedure. Build-up your immune system: Take 1000 mg of Vitamin C with every meal (3 times a day) the day prior to your procedure. Antibiotics: Inform the staff if you have a condition or reason that  requires you to take antibiotics before dental procedures. Pregnancy: If you are pregnant, call and cancel the procedure. Sickness: If you have a cold, fever, or any active infections, call and cancel the procedure. Arrival: You must be in the facility at least 30 minutes prior to your scheduled procedure. Children: Do not bring children with you. Dress appropriately: There is always the possibility that your clothing may get soiled. Valuables: Do not bring any jewelry or valuables.  Reasons to call and reschedule or cancel your procedure: (Following these recommendations will minimize the risk of a serious complication.) Surgeries: Avoid having procedures within 2 weeks of any surgery. (Avoid for 2 weeks before or after any surgery). Flu Shots: Avoid having procedures within 2 weeks of a flu shots. (Avoid for 2 weeks before or after immunizations). Barium: Avoid having a procedure within 7-10 days after having had a radiological study involving the use of radiological contrast. (Myelograms, Barium swallow or enema study). Heart attacks: Avoid any elective procedures or surgeries for the initial 6 months after a "Myocardial Infarction" (Heart Attack). Blood thinners: It is imperative that you stop these medications before procedures. Let us know if you if you take any blood thinner.  Infection: Avoid procedures during or within two weeks of an infection (including chest colds or gastrointestinal problems). Symptoms associated with infections include: Localized redness, fever, chills, night sweats or profuse sweating, burning sensation when voiding, cough, congestion, stuffiness, runny nose, sore throat, diarrhea, nausea, vomiting, cold or Flu symptoms, recent or current infections. It is specially important if the infection is over the area that we intend to treat. Heart and lung problems: Symptoms that may suggest an active cardiopulmonary  problem include: cough, chest pain, breathing difficulties or  shortness of breath, dizziness, ankle swelling, uncontrolled high or unusually low blood pressure, and/or palpitations. If you are experiencing any of these symptoms, cancel your procedure and contact your primary care physician for an evaluation.  Remember:  Regular Business hours are:  Monday to Thursday 8:00 AM to 4:00 PM  Provider's Schedule: Milinda Pointer, MD:  Procedure days: Tuesday and Thursday 7:30 AM to 4:00 PM  Gillis Santa, MD:  Procedure days: Monday and Wednesday 7:30 AM to 4:00 PM ______________________________________________________________________  ____________________________________________________________________________________________  General Risks and Possible Complications  Patient Responsibilities: It is important that you read this as it is part of your informed consent. It is our duty to inform you of the risks and possible complications associated with treatments offered to you. It is your responsibility as a patient to read this and to ask questions about anything that is not clear or that you believe was not covered in this document.  Patient's Rights: You have the right to refuse treatment. You also have the right to change your mind, even after initially having agreed to have the treatment done. However, under this last option, if you wait until the last second to change your mind, you may be charged for the materials used up to that point.  Introduction: Medicine is not an Chief Strategy Officer. Everything in Medicine, including the lack of treatment(s), carries the potential for danger, harm, or loss (which is by definition: Risk). In Medicine, a complication is a secondary problem, condition, or disease that can aggravate an already existing one. All treatments carry the risk of possible complications. The fact that a side effects or complications occurs, does not imply that the treatment was conducted incorrectly. It must be clearly understood that these can  happen even when everything is done following the highest safety standards.  No treatment: You can choose not to proceed with the proposed treatment alternative. The "PRO(s)" would include: avoiding the risk of complications associated with the therapy. The "CON(s)" would include: not getting any of the treatment benefits. These benefits fall under one of three categories: diagnostic; therapeutic; and/or palliative. Diagnostic benefits include: getting information which can ultimately lead to improvement of the disease or symptom(s). Therapeutic benefits are those associated with the successful treatment of the disease. Finally, palliative benefits are those related to the decrease of the primary symptoms, without necessarily curing the condition (example: decreasing the pain from a flare-up of a chronic condition, such as incurable terminal cancer).  General Risks and Complications: These are associated to most interventional treatments. They can occur alone, or in combination. They fall under one of the following six (6) categories: no benefit or worsening of symptoms; bleeding; infection; nerve damage; allergic reactions; and/or death. No benefits or worsening of symptoms: In Medicine there are no guarantees, only probabilities. No healthcare provider can ever guarantee that a medical treatment will work, they can only state the probability that it may. Furthermore, there is always the possibility that the condition may worsen, either directly, or indirectly, as a consequence of the treatment. Bleeding: This is more common if the patient is taking a blood thinner, either prescription or over the counter (example: Goody Powders, Fish oil, Aspirin, Garlic, etc.), or if suffering a condition associated with impaired coagulation (example: Hemophilia, cirrhosis of the liver, low platelet counts, etc.). However, even if you do not have one on these, it can still happen. If you have any of these conditions, or  take one  of these drugs, make sure to notify your treating physician. Infection: This is more common in patients with a compromised immune system, either due to disease (example: diabetes, cancer, human immunodeficiency virus [HIV], etc.), or due to medications or treatments (example: therapies used to treat cancer and rheumatological diseases). However, even if you do not have one on these, it can still happen. If you have any of these conditions, or take one of these drugs, make sure to notify your treating physician. Nerve Damage: This is more common when the treatment is an invasive one, but it can also happen with the use of medications, such as those used in the treatment of cancer. The damage can occur to small secondary nerves, or to large primary ones, such as those in the spinal cord and brain. This damage may be temporary or permanent and it may lead to impairments that can range from temporary numbness to permanent paralysis and/or brain death. Allergic Reactions: Any time a substance or material comes in contact with our body, there is the possibility of an allergic reaction. These can range from a mild skin rash (contact dermatitis) to a severe systemic reaction (anaphylactic reaction), which can result in death. Death: In general, any medical intervention can result in death, most of the time due to an unforeseen complication. ____________________________________________________________________________________________ ____________________________________________________________________________________________  Pharmacy Shortages of Pain Medication   Introduction Shockingly as it may seem, .  "No U.[place] Supreme Court decision has ever interpreted the Constitution as guaranteeing a right to health care for all Americans." - https://huff.com/  "With respect to human rights, the [place] has no formally codified right to  health, nor does it participate in a human rights treaty that specifies a right to health." - Scott J. Schweikart, JD, MBE  Situation By now, most of our patients have had the experience of being told by their pharmacist that they do not have enough medication to cover their prescription. If you have not had this experience, just know that you soon will.  Problem There appears to be a shortage of these medications, either at the national level or locally. This is happening with all pharmacies. When there is not enough medication, patients are offered a partial fill and they are told that they will try to get the rest of the medicine for them at a later time. If they do not have enough for even a partial fill, the pharmacists are telling the patients to call us (the prescribing physicians) to request that we send another prescription to another pharmacy to get the medicine.   This reordering of a controlled substance creates documentation problems where additional paperwork needs to be created to explain why two prescriptions for the same period of time and the same medicine are being prescribed to the same patient. It also creates situations where the last appointment note does not accurately reflect when and what prescriptions were given to a patient. This leads to prescribing errors down the line, in subsequent follow-up visits.   [employer] of Pharmacy ([employer]) Research revealed that Surveyor, quantity .1806 (21 NCAC 46.1806) authorizes pharmacists to the transfer of prescriptions among pharmacies, and it sets forth procedural and recordkeeping requirements for doing so. However, this requires the pharmacist to complete the previously mentioned procedural paperwork to accomplish the transfer. As it turns out, it is much easier for them to have the prescribing physicians do the work.   Possible solutions 1. You can ask your physician to assist you in  weaning yourself off these  medications. 2. Ask your pharmacy if the medication is in stock, 3 days prior to your refill. 3. If you need a pharmacy change, let us know at your medication management visit. Prescriptions that have already been electronically sent to a pharmacy will not be re-sent to a different pharmacy if your pharmacy of record does not have it in stock. Proper stocking of medication is a pharmacy problem, not a prescriber problem. Work with your pharmacist to solve the problem. 4. Have the [facility] Assembly add a provision to the "STOP ACT" (the law that mandates how controlled substances are prescribed) where there is an exception to the electronic prescribing rule that states that in the event there are shortages of medications the physicians are allowed to use written prescriptions as opposed to electronic ones. This would allow patients to take their prescriptions to a different pharmacy that may have enough medication available to fill the prescription. The problem is that currently there is a law that does not allow for written prescriptions, with the exception of instances where the electronic medical record is down due to technical issues.  5. Have [place] Congress ease the pressure on pharmaceutical companies, allowing them to produce enough quantities of the medication to adequately supply the population. 6. Have pharmacies keep enough stocks of these medications to cover their client base.  7. Have the [facility] Assembly add a provision to the "STOP ACT" where they ease the regulations surrounding the transfer of controlled substances between pharmacies, so as to simplify the transfer of supplies. As an alternative, develop a system to allow patients to obtain the remainder of their prescription at another one of their pharmacies or at an associate pharmacy.   How this shortage will affect you.  Understand that this is a pharmacy supply problem, not a prescriber problem. Work with your  pharmacy to solve it. The job of the prescriber is to evaluate and monitor the patient for the appropriate indications and use of these medicines. It is not the job of the prescriber to supply the medication or to solve problems with that supply. The responsibility and the choice to obtain the medication resides on the patient. By law, supplying the medication is the job of the pharmacy. It is certainly not the job of the prescriber to solve supply problems.   Due to the above problems we are no longer taking patients to write for their pain medication. Future discussions with your physician may include potentially weaning medications or transitioning to alternatives.  We will be focusing primarily on interventional based pain management. We will continue to evaluate for appropriate indications and we may provide recommendations regarding medication, dose, and schedule, as well as monitoring recommendations, however, we will not be taking over the actual prescribing of these substances. On those patients where we are treating their chronic pain with interventional therapies, exceptions will be considered on a case by case basis. At this time, we will try to continue providing this supplemental service to those patients we have been managing in the past. However, as of August 1st, 2023, we no longer will be sending additional prescriptions to other pharmacies for the purpose of solving their supply problems. Once we send a prescription to a pharmacy, we will not be resending it again to another pharmacy to cover for their shortages.   What to do. Write as many letters as you can. Recruit the help of family members in writing these letters.  Below are some of the places where you can write to make your voice heard. Let them know what the problem is and push them to look for solutions.   Search internet for: "[employer] find your legislators" NoseSwap.is  Search internet for:  "[employer] commissioner complaints" Starlas.fi  Search internet for: "[employer] complaints" https://www.hernandez-brewer.com/.htm  Search internet for: "[facility] pharmacy complaints" Email [facility] Customer Relations woondaal.com.jsp?callType=store  Search internet for: Programme researcher, broadcasting/film/video customer service complaints" https://www.[facility].com/topic/marketing/contactus/contactus_customerservice.jsp  ____________________________________________________________________________________________  ____________________________________________________________________________________________  Medication Rules  Purpose: To inform patients, and their family members, of our rules and regulations.  Applies to: All patients receiving prescriptions (written or electronic).  Pharmacy of record: Pharmacy where electronic prescriptions will be sent. If written prescriptions are taken to a different pharmacy, please inform the nursing staff. The pharmacy listed in the electronic medical record should be the one where you would like electronic prescriptions to be sent.  Electronic prescriptions: In compliance with the [place] (STOP) Act of 2017 (Session Lanny Cramp [phone]), effective February 06, 2018, all controlled substances must be electronically prescribed. Calling prescriptions to the pharmacy will cease to exist.  Prescription refills: Only during scheduled appointments. Applies to all prescriptions.  NOTE: The following applies primarily to controlled substances (Opioid* Pain Medications).   Type of encounter (visit): For patients receiving controlled substances, face-to-face visits are required. (Not an option or up to the patient.)  Patient's responsibilities: Pain Pills: Bring all pain pills to every appointment (except for  procedure appointments). Pill Bottles: Bring pills in original pharmacy bottle. Always bring the newest bottle. Bring bottle, even if empty. Medication refills: You are responsible for knowing and keeping track of what medications you take and those you need refilled. The day before your appointment: write a list of all prescriptions that need to be refilled. The day of the appointment: give the list to the admitting nurse. Prescriptions will be written only during appointments. No prescriptions will be written on procedure days. If you forget a medication: it will not be "Called in", "Faxed", or "electronically sent". You will need to get another appointment to get these prescribed. No early refills. Do not call asking to have your prescription filled early. Prescription Accuracy: You are responsible for carefully inspecting your prescriptions before leaving our office. Have the discharge nurse carefully go over each prescription with you, before taking them home. Make sure that your name is accurately spelled, that your address is correct. Check the name and dose of your medication to make sure it is accurate. Check the number of pills, and the written instructions to make sure they are clear and accurate. Make sure that you are given enough medication to last until your next medication refill appointment. Taking Medication: Take medication as prescribed. When it comes to controlled substances, taking less pills or less frequently than prescribed is permitted and encouraged. Never take more pills than instructed. Never take medication more frequently than prescribed.  Inform other Doctors: Always inform, all of your healthcare providers, of all the medications you take. Pain Medication from other Providers: You are not allowed to accept any additional pain medication from any other Doctor or Healthcare provider. There are two exceptions to this rule. (see below) In the event that you require additional  pain medication, you are responsible for notifying us, as stated below. Cough Medicine: Often these contain an opioid, such as codeine or hydrocodone. Never accept or take cough medicine containing these opioids if  you are already taking an opioid* medication. The combination may cause respiratory failure and death. Medication Agreement: You are responsible for carefully reading and following our Medication Agreement. This must be signed before receiving any prescriptions from our practice. Safely store a copy of your signed Agreement. Violations to the Agreement will result in no further prescriptions. (Additional copies of our Medication Agreement are available upon request.) Laws, Rules, & Regulations: All patients are expected to follow all Federal and [employer], Rules, [employer]. Ignorance of the Laws does not constitute a valid excuse.  Illegal drugs and Controlled Substances: The use of illegal substances (including, but not limited to marijuana and its derivatives) and/or the illegal use of any controlled substances is strictly prohibited. Violation of this rule may result in the immediate and permanent discontinuation of any and all prescriptions being written by our practice. The use of any illegal substances is prohibited. Adopted CDC guidelines & recommendations: Target dosing levels will be at or below 60 MME/day. Use of benzodiazepines** is not recommended.  Exceptions: There are only two exceptions to the rule of not receiving pain medications from other Healthcare Providers. Exception #1 (Emergencies): In the event of an emergency (i.e.: accident requiring emergency care), you are allowed to receive additional pain medication. However, you are responsible for: As soon as you are able, call our office [phone], at any time of the day or night, and leave a message stating your name, the date and nature of the emergency, and the name and dose of the medication prescribed. In  the event that your call is answered by a member of our staff, make sure to document and save the date, time, and the name of the person that took your information.  Exception #2 (Planned Surgery): In the event that you are scheduled by another doctor or dentist to have any type of surgery or procedure, you are allowed (for a period no longer than 30 days), to receive additional pain medication, for the acute post-op pain. However, in this case, you are responsible for picking up a copy of our "Post-op Pain Management for Surgeons" handout, and giving it to your surgeon or dentist. This document is available at our office, and does not require an appointment to obtain it. Simply go to our office during business hours (Monday-Thursday from 8:00 AM to 4:00 PM) (Friday 8:00 AM to 12:00 Noon) or if you have a scheduled appointment with [place], prior to your surgery, and ask for it by name. In addition, you are responsible for: calling our office [phone], at any time of the day or night, and leaving a message stating your name, name of your surgeon, type of surgery, and date of procedure or surgery. Failure to comply with your responsibilities may result in termination of therapy involving the controlled substances. Medication Agreement Violation. Following the above rules, including your responsibilities will help you in avoiding a Medication Agreement Violation ("Breaking your Pain Medication Contract").  *Opioid medications include: morphine, codeine, oxycodone, oxymorphone, hydrocodone, hydromorphone, meperidine, tramadol, tapentadol, buprenorphine, fentanyl, methadone. **Benzodiazepine medications include: diazepam (Valium), alprazolam (Xanax), clonazepam (Klonopine), lorazepam (Ativan), clorazepate (Tranxene), chlordiazepoxide (Librium), estazolam (Prosom), oxazepam (Serax), temazepam (Restoril), triazolam (Halcion) (Last updated:  11/03/2020) ____________________________________________________________________________________________  ____________________________________________________________________________________________  Medication Recommendations and Reminders  Applies to: All patients receiving prescriptions (written and/or electronic).  Medication Rules & Regulations: These rules and regulations exist for your safety and that of others. They are not flexible and neither are we. Dismissing or ignoring them will  be considered "non-compliance" with medication therapy, resulting in complete and irreversible termination of such therapy. (See document titled "Medication Rules" for more details.) In all conscience, because of safety reasons, we cannot continue providing a therapy where the patient does not follow instructions.  Pharmacy of record:  Definition: This is the pharmacy where your electronic prescriptions will be sent.  We do not endorse any particular pharmacy, however, we have experienced problems with [facility] not securing enough medication supply for the community. We do not restrict you in your choice of pharmacy. However, once we write for your prescriptions, we will NOT be re-sending more prescriptions to fix restricted supply problems created by your pharmacy, or your insurance.  The pharmacy listed in the electronic medical record should be the one where you want electronic prescriptions to be sent. If you choose to change pharmacy, simply notify our nursing staff.  Recommendations: Keep all of your pain medications in a safe place, under lock and key, even if you live alone. We will NOT replace lost, stolen, or damaged medication. After you fill your prescription, take 1 week's worth of pills and put them away in a safe place. You should keep a separate, properly labeled bottle for this purpose. The remainder should be kept in the original bottle. Use this as your primary supply, until it runs out.  Once it's gone, then you know that you have 1 week's worth of medicine, and it is time to come in for a prescription refill. If you do this correctly, it is unlikely that you will ever run out of medicine. To make sure that the above recommendation works, it is very important that you make sure your medication refill appointments are scheduled at least 1 week before you run out of medicine. To do this in an effective manner, make sure that you do not leave the office without scheduling your next medication management appointment. Always ask the nursing staff to show you in your prescription , when your medication will be running out. Then arrange for the receptionist to get you a return appointment, at least 7 days before you run out of medicine. Do not wait until you have 1 or 2 pills left, to come in. This is very poor planning and does not take into consideration that we may need to cancel appointments due to bad weather, sickness, or emergencies affecting our staff. DO NOT ACCEPT A "Partial Fill": If for any reason your pharmacy does not have enough pills/tablets to completely fill or refill your prescription, do not allow for a "partial fill". The law allows the pharmacy to complete that prescription within 72 hours, without requiring a new prescription. If they do not fill the rest of your prescription within those 72 hours, you will need a separate prescription to fill the remaining amount, which we will NOT provide. If the reason for the partial fill is your insurance, you will need to talk to the pharmacist about payment alternatives for the remaining tablets, but again, DO NOT ACCEPT A PARTIAL FILL, unless you can trust your pharmacist to obtain the remainder of the pills within 72 hours.  Prescription refills and/or changes in medication(s):  Prescription refills, and/or changes in dose or medication, will be conducted only during scheduled medication management appointments. (Applies to both,  written and electronic prescriptions.) No refills on procedure days. No medication will be changed or started on procedure days. No changes, adjustments, and/or refills will be conducted on a procedure day. Doing so will interfere  with the diagnostic portion of the procedure. No phone refills. No medications will be "called into the pharmacy". No Fax refills. No weekend refills. No Holliday refills. No after hours refills.  Remember:  Business hours are:  Monday to Thursday 8:00 AM to 4:00 PM Provider's Schedule: Milinda Pointer, MD - Appointments are:  Medication management: Monday and Wednesday 8:00 AM to 4:00 PM Procedure day: Tuesday and Thursday 7:30 AM to 4:00 PM Gillis Santa, MD - Appointments are:  Medication management: Tuesday and Thursday 8:00 AM to 4:00 PM Procedure day: Monday and Wednesday 7:30 AM to 4:00 PM (Last update: 08/27/2019) ____________________________________________________________________________________________  ____________________________________________________________________________________________  CBD (cannabidiol) & Delta-8 (Delta-8 tetrahydrocannabinol) WARNING  Intro: Cannabidiol (CBD) and tetrahydrocannabinol (THC), are two natural compounds found in plants of the Cannabis genus. They can both be extracted from hemp or cannabis. Hemp and cannabis come from the Cannabis sativa plant. Both compounds interact with your body's endocannabinoid system, but they have very different effects. CBD does not produce the high sensation associated with cannabis. Delta-8 tetrahydrocannabinol, also known as delta-8 THC, is a psychoactive substance found in the Cannabis sativa plant, of which marijuana and hemp are two varieties. THC is responsible for the high associated with the illicit use of marijuana.  Applicable to: All individuals currently taking or considering taking CBD (cannabidiol) and, more important, all patients taking opioid analgesic controlled  substances (pain medication). (Example: oxycodone; oxymorphone; hydrocodone; hydromorphone; morphine; methadone; tramadol; tapentadol; fentanyl; buprenorphine; butorphanol; dextromethorphan; meperidine; codeine; etc.)  Legal status: CBD remains a Schedule I drug prohibited for any use. CBD is illegal with one exception. In the [place], CBD has a limited Transport planner (FDA) approval for the treatment of two specific types of epilepsy disorders. Only one CBD product has been approved by the FDA for this purpose: "Epidiolex". FDA is aware that some companies are marketing products containing cannabis and cannabis-derived compounds in ways that violate the [employer], Drug and Cosmetic Act [facility] Act) and that may put the health and safety of consumers at risk. The FDA, a Federal agency, has not enforced the CBD status since 2018. UPDATE: (03/25/2021) The Drug Enforcement Agency ([place]) issued a letter stating that "delta" cannabinoids, including Delta-8-THCO and Delta-9-THCO, synthetically derived from hemp do not qualify as hemp and will be viewed as Schedule I drugs. (Schedule I drugs, substances, or chemicals are defined as drugs with no currently accepted medical use and a high potential for abuse. Some examples of Schedule I drugs are: heroin, lysergic acid diethylamide (LSD), marijuana (cannabis), 3,4-methylenedioxymethamphetamine (ecstasy), methaqualone, and peyote.) (https://jennings.com/)  Legality: Some manufacturers ship CBD products nationally, which is illegal. Often such products are sold online and are therefore available throughout the country. CBD is openly sold in head shops and health food stores in some states where such sales have not been explicitly legalized. Selling unapproved products with unsubstantiated therapeutic claims is not only a violation of the law, but also can put patients at risk, as these products have not been proven to be safe or effective. Federal  illegality makes it difficult to conduct research on CBD.  Reference: "FDA Regulation of Cannabis and Cannabis-Derived Products, Including Cannabidiol (CBD)" - SeekArtists.[employer].pt  Warning: CBD is not FDA approved and has not undergo the same manufacturing controls as prescription drugs.  This means that the purity and safety of available CBD may be questionable. Most of the time, despite manufacturer's claims, it is contaminated with THC (delta-9-tetrahydrocannabinol - the chemical in marijuana responsible for the "HIGH").  When this is the case, the [facility] contaminant will trigger a positive urine drug screen (UDS) test for Marijuana (carboxy-THC). Because a positive UDS for any illicit substance is a violation of our medication agreement, your opioid analgesics (pain medicine) may be permanently discontinued. The FDA recently put out a warning about 5 things that everyone should be aware of regarding Delta-8 THC: Delta-8 THC products have not been evaluated or approved by the FDA for safe use and may be marketed in ways that put the public health at risk. The FDA has received adverse event reports involving delta-8 THC-containing products. Delta-8 THC has psychoactive and intoxicating effects. Delta-8 THC manufacturing often involve use of potentially harmful chemicals to create the concentrations of delta-8 THC claimed in the marketplace. The final delta-8 THC product may have potentially harmful by-products (contaminants) due to the chemicals used in the process. Manufacturing of delta-8 THC products may occur in uncontrolled or unsanitary settings, which may lead to the presence of unsafe contaminants or other potentially harmful substances. Delta-8 THC products should be kept out of the reach of children and pets.  MORE ABOUT CBD  General Information: CBD was discovered in [birth year] and it is  a derivative of the cannabis sativa genus plants (Marijuana and Hemp). It is one of the 113 identified substances found in Marijuana. It accounts for up to 40% of the plant's extract. As of 2018, preliminary clinical studies on CBD included research for the treatment of anxiety, movement disorders, and pain. CBD is available and consumed in multiple forms, including inhalation of smoke or vapor, as an aerosol spray, and by mouth. It may be supplied as an oil containing CBD, capsules, dried cannabis, or as a liquid solution. CBD is thought not to be as psychoactive as THC (delta-9-tetrahydrocannabinol - the chemical in marijuana responsible for the "HIGH"). Studies suggest that CBD may interact with different biological target receptors in the body, including cannabinoid and other neurotransmitter receptors. As of 2018 the mechanism of action for its biological effects has not been determined.  Side-effects  Adverse reactions: Dry mouth, diarrhea, decreased appetite, fatigue, drowsiness, malaise, weakness, sleep disturbances, and others.  Drug interactions: CBC may interact with other medications such as blood-thinners. Because CBD causes drowsiness on its own, it also increases the drowsiness caused by other medications, including antihistamines (such as Benadryl), benzodiazepines (Xanax, Ativan, Valium), antipsychotics, antidepressants and opioids, as well as alcohol and supplements such as kava, melatonin and St. John's Wort. Be cautious with the following combinations:   Brivaracetam (Briviact) Brivaracetam is changed and broken down by the body. CBD might decrease how quickly the body breaks down brivaracetam. This might increase levels of brivaracetam in the body.  Caffeine Caffeine is changed and broken down by the body. CBD might decrease how quickly the body breaks down caffeine. This might increase levels of caffeine in the body.  Carbamazepine (Tegretol) Carbamazepine is changed and broken  down by the body. CBD might decrease how quickly the body breaks down carbamazepine. This might increase levels of carbamazepine in the body and increase its side effects.  Citalopram (Celexa) Citalopram is changed and broken down by the body. CBD might decrease how quickly the body breaks down citalopram. This might increase levels of citalopram in the body and increase its side effects.  Clobazam (Onfi) Clobazam is changed and broken down by the liver. CBD might decrease how quickly the liver breaks down clobazam. This might increase the effects and side effects of clobazam.  Eslicarbazepine (Aptiom) Eslicarbazepine is  changed and broken down by the body. CBD might decrease how quickly the body breaks down eslicarbazepine. This might increase levels of eslicarbazepine in the body by a small amount.  Everolimus (Zostress) Everolimus is changed and broken down by the body. CBD might decrease how quickly the body breaks down everolimus. This might increase levels of everolimus in the body.  Lithium Taking higher doses of CBD might increase levels of lithium. This can increase the risk of lithium toxicity.  Medications changed by the liver (Cytochrome P450 1A1 (CYP1A1) substrates) Some medications are changed and broken down by the liver. CBD might change how quickly the liver breaks down these medications. This could change the effects and side effects of these medications.  Medications changed by the liver (Cytochrome P450 1A2 (CYP1A2) substrates) Some medications are changed and broken down by the liver. CBD might change how quickly the liver breaks down these medications. This could change the effects and side effects of these medications.  Medications changed by the liver (Cytochrome P450 1B1 (CYP1B1) substrates) Some medications are changed and broken down by the liver. CBD might change how quickly the liver breaks down these medications. This could change the effects and side effects of  these medications.  Medications changed by the liver (Cytochrome P450 2A6 (CYP2A6) substrates) Some medications are changed and broken down by the liver. CBD might change how quickly the liver breaks down these medications. This could change the effects and side effects of these medications.  Medications changed by the liver (Cytochrome P450 2B6 (CYP2B6) substrates) Some medications are changed and broken down by the liver. CBD might change how quickly the liver breaks down these medications. This could change the effects and side effects of these medications.  Medications changed by the liver (Cytochrome P450 2C19 (CYP2C19) substrates) Some medications are changed and broken down by the liver. CBD might change how quickly the liver breaks down these medications. This could change the effects and side effects of these medications.  Medications changed by the liver (Cytochrome P450 2C8 (CYP2C8) substrates) Some medications are changed and broken down by the liver. CBD might change how quickly the liver breaks down these medications. This could change the effects and side effects of these medications.  Medications changed by the liver (Cytochrome P450 2C9 (CYP2C9) substrates) Some medications are changed and broken down by the liver. CBD might change how quickly the liver breaks down these medications. This could change the effects and side effects of these medications.  Medications changed by the liver (Cytochrome P450 2D6 (CYP2D6) substrates) Some medications are changed and broken down by the liver. CBD might change how quickly the liver breaks down these medications. This could change the effects and side effects of these medications.  Medications changed by the liver (Cytochrome P450 2E1 (CYP2E1) substrates) Some medications are changed and broken down by the liver. CBD might change how quickly the liver breaks down these medications. This could change the effects and side effects of these  medications.  Medications changed by the liver (Cytochrome P450 3A4 (CYP3A4) substrates) Some medications are changed and broken down by the liver. CBD might change how quickly the liver breaks down these medications. This could change the effects and side effects of these medications.  Medications changed by the liver (Glucuronidated drugs) Some medications are changed and broken down by the liver. CBD might change how quickly the liver breaks down these medications. This could change the effects and side effects of these medications.  Medications that decrease  the breakdown of other medications by the liver (Cytochrome P450 2C19 (CYP2C19) inhibitors) CBD is changed and broken down by the liver. Some drugs decrease how quickly the liver changes and breaks down CBD. This could change the effects and side effects of CBD.  Medications that decrease the breakdown of other medications in the liver (Cytochrome P450 3A4 (CYP3A4) inhibitors) CBD is changed and broken down by the liver. Some drugs decrease how quickly the liver changes and breaks down CBD. This could change the effects and side effects of CBD.  Medications that increase breakdown of other medications by the liver (Cytochrome P450 3A4 (CYP3A4) inducers) CBD is changed and broken down by the liver. Some drugs increase how quickly the liver changes and breaks down CBD. This could change the effects and side effects of CBD.  Medications that increase the breakdown of other medications by the liver (Cytochrome P450 2C19 (CYP2C19) inducers) CBD is changed and broken down by the liver. Some drugs increase how quickly the liver changes and breaks down CBD. This could change the effects and side effects of CBD.  Methadone (Dolophine) Methadone is broken down by the liver. CBD might decrease how quickly the liver breaks down methadone. Taking cannabidiol along with methadone might increase the effects and side effects of methadone.  Rufinamide  (Banzel) Rufinamide is changed and broken down by the body. CBD might decrease how quickly the body breaks down rufinamide. This might increase levels of rufinamide in the body by a small amount.  Sedative medications (CNS depressants) CBD might cause sleepiness and slowed breathing. Some medications, called sedatives, can also cause sleepiness and slowed breathing. Taking CBD with sedative medications might cause breathing problems and/or too much sleepiness.  Sirolimus (Rapamune) Sirolimus is changed and broken down by the body. CBD might decrease how quickly the body breaks down sirolimus. This might increase levels of sirolimus in the body.  Stiripentol (Diacomit) Stiripentol is changed and broken down by the body. CBD might decrease how quickly the body breaks down stiripentol. This might increase levels of stiripentol in the body and increase its side effects.  Tacrolimus (Prograf) Tacrolimus is changed and broken down by the body. CBD might decrease how quickly the body breaks down tacrolimus. This might increase levels of tacrolimus in the body.  Tamoxifen (Soltamox) Tamoxifen is changed and broken down by the body. CBD might affect how quickly the body breaks down tamoxifen. This might affect levels of tamoxifen in the body.  Topiramate (Topamax) Topiramate is changed and broken down by the body. CBD might decrease how quickly the body breaks down topiramate. This might increase levels of topiramate in the body by a small amount.  Valproate Valproic acid can cause liver injury. Taking cannabidiol with valproic acid might increase the chance of liver injury. CBD and/or valproic acid might need to be stopped, or the dose might need to be reduced.  Warfarin (Coumadin) CBD might increase levels of warfarin, which can increase the risk for bleeding. CBD and/or warfarin might need to be stopped, or the dose might need to be reduced.  Zonisamide Zonisamide is changed and broken down by  the body. CBD might decrease how quickly the body breaks down zonisamide. This might increase levels of zonisamide in the body by a small amount. (Last update: 04/06/2021) ____________________________________________________________________________________________  ____________________________________________________________________________________________  Drug Holidays (Slow)  What is a "Drug Holiday"? Drug Holiday: is the name given to the period of time during which a patient stops taking a medication(s) for the purpose of  eliminating tolerance to the drug.  Benefits Improved effectiveness of opioids. Decreased opioid dose needed to achieve benefits. Improved pain with lesser dose.  What is tolerance? Tolerance: is the progressive decreased in effectiveness of a drug due to its repetitive use. With repetitive use, the body gets use to the medication and as a consequence, it loses its effectiveness. This is a common problem seen with opioid pain medications. As a result, a larger dose of the drug is needed to achieve the same effect that used to be obtained with a smaller dose.  How long should a "Drug Holiday" last? You should stay off of the pain medicine for at least 14 consecutive days. (2 weeks)  Should I stop the medicine "cold [place]"? No. You should always coordinate with your Pain Specialist so that he/she can provide you with the correct medication dose to make the transition as smoothly as possible.  How do I stop the medicine? Slowly. You will be instructed to decrease the daily amount of pills that you take by one (1) pill every seven (7) days. This is called a "slow downward taper" of your dose. For example: if you normally take four (4) pills per day, you will be asked to drop this dose to three (3) pills per day for seven (7) days, then to two (2) pills per day for seven (7) days, then to one (1) per day for seven (7) days, and at the end of those last seven (7) days, this  is when the "Drug Holiday" would start.   Will I have withdrawals? By doing a "slow downward taper" like this one, it is unlikely that you will experience any significant withdrawal symptoms. Typically, what triggers withdrawals is the sudden stop of a high dose opioid therapy. Withdrawals can usually be avoided by slowly decreasing the dose over a prolonged period of time. If you do not follow these instructions and decide to stop your medication abruptly, withdrawals may be possible.  What are withdrawals? Withdrawals: refers to the wide range of symptoms that occur after stopping or dramatically reducing opiate drugs after heavy and prolonged use. Withdrawal symptoms do not occur to patients that use low dose opioids, or those who take the medication sporadically. Contrary to benzodiazepine (example: Valium, Xanax, etc.) or alcohol withdrawals ("Delirium Tremens"), opioid withdrawals are not lethal. Withdrawals are the physical manifestation of the body getting rid of the excess receptors.  Expected Symptoms Early symptoms of withdrawal may include: Agitation Anxiety Muscle aches Increased tearing Insomnia Runny nose Sweating Yawning  Late symptoms of withdrawal may include: Abdominal cramping Diarrhea Dilated pupils Goose bumps Nausea Vomiting  Will I experience withdrawals? Due to the slow nature of the taper, it is very unlikely that you will experience any.  What is a slow taper? Taper: refers to the gradual decrease in dose.  (Last update: 08/27/2019) ____________________________________________________________________________________________   ______________________________________________________________________  Preparing for Procedure with Sedation  NOTICE: Due to recent regulatory changes, starting on September 06, 2020, procedures requiring intravenous (IV) sedation will no longer be performed at the [place].  These types of procedures are required to be  performed at [facility] ambulatory surgery facility.  We are very sorry for the inconvenience.  Procedure appointments are limited to planned procedures: No Prescription Refills. No disability issues will be discussed. No medication changes will be discussed.  Instructions: Oral Intake: Do not eat or drink anything for at least 8 hours prior to your procedure. (Exception: Blood Pressure Medication. See below.) Transportation:  A driver is required. You may not drive yourself after the procedure. Blood Pressure Medicine: Do not forget to take your blood pressure medicine with a sip of water the morning of the procedure. If your Diastolic (lower reading) is above 100 mmHg, elective cases will be cancelled/rescheduled. Blood thinners: These will need to be stopped for procedures. Notify our staff if you are taking any blood thinners. Depending on which one you take, there will be specific instructions on how and when to stop it. Diabetics on insulin: Notify the staff so that you can be scheduled 1st case in the morning. If your diabetes requires high dose insulin, take only  of your normal insulin dose the morning of the procedure and notify the staff that you have done so. Preventing infections: Shower with an antibacterial soap the morning of your procedure. Build-up your immune system: Take 1000 mg of Vitamin C with every meal (3 times a day) the day prior to your procedure. Antibiotics: Inform the staff if you have a condition or reason that requires you to take antibiotics before dental procedures. Pregnancy: If you are pregnant, call and cancel the procedure. Sickness: If you have a cold, fever, or any active infections, call and cancel the procedure. Arrival: You must be in the facility at least 30 minutes prior to your scheduled procedure. Children: Do not bring children with you. Dress appropriately: There is always the possibility that your clothing may get soiled. Valuables: Do not bring any  jewelry or valuables.  Reasons to call and reschedule or cancel your procedure: (Following these recommendations will minimize the risk of a serious complication.) Surgeries: Avoid having procedures within 2 weeks of any surgery. (Avoid for 2 weeks before or after any surgery). Flu Shots: Avoid having procedures within 2 weeks of a flu shots. (Avoid for 2 weeks before or after immunizations). Barium: Avoid having a procedure within 7-10 days after having had a radiological study involving the use of radiological contrast. (Myelograms, Barium swallow or enema study). Heart attacks: Avoid any elective procedures or surgeries for the initial 6 months after a "Myocardial Infarction" (Heart Attack). Blood thinners: It is imperative that you stop these medications before procedures. Let us know if you if you take any blood thinner.  Infection: Avoid procedures during or within two weeks of an infection (including chest colds or gastrointestinal problems). Symptoms associated with infections include: Localized redness, fever, chills, night sweats or profuse sweating, burning sensation when voiding, cough, congestion, stuffiness, runny nose, sore throat, diarrhea, nausea, vomiting, cold or Flu symptoms, recent or current infections. It is specially important if the infection is over the area that we intend to treat. Heart and lung problems: Symptoms that may suggest an active cardiopulmonary problem include: cough, chest pain, breathing difficulties or shortness of breath, dizziness, ankle swelling, uncontrolled high or unusually low blood pressure, and/or palpitations. If you are experiencing any of these symptoms, cancel your procedure and contact your primary care physician for an evaluation.  Remember:  Regular Business hours are:  Monday to Thursday 8:00 AM to 4:00 PM  Provider's Schedule: Milinda Pointer, MD:  Procedure days: Tuesday and Thursday 7:30 AM to 4:00 PM  Gillis Santa, MD:  Procedure  days: Monday and Wednesday 7:30 AM to 4:00 PM ______________________________________________________________________

## 2021-11-09 DIAGNOSIS — Z739 Problem related to life management difficulty, unspecified: Secondary | ICD-10-CM | POA: Diagnosis not present

## 2021-11-09 DIAGNOSIS — Z1330 Encounter for screening examination for mental health and behavioral disorders, unspecified: Secondary | ICD-10-CM | POA: Diagnosis not present

## 2021-11-09 DIAGNOSIS — R7303 Prediabetes: Secondary | ICD-10-CM | POA: Diagnosis not present

## 2021-11-09 DIAGNOSIS — Z1331 Encounter for screening for depression: Secondary | ICD-10-CM | POA: Diagnosis not present

## 2021-11-09 DIAGNOSIS — E785 Hyperlipidemia, unspecified: Secondary | ICD-10-CM | POA: Diagnosis not present

## 2021-11-09 DIAGNOSIS — Z0001 Encounter for general adult medical examination with abnormal findings: Secondary | ICD-10-CM | POA: Diagnosis not present

## 2021-11-09 DIAGNOSIS — E559 Vitamin D deficiency, unspecified: Secondary | ICD-10-CM | POA: Diagnosis not present

## 2021-11-09 DIAGNOSIS — I2510 Atherosclerotic heart disease of native coronary artery without angina pectoris: Secondary | ICD-10-CM | POA: Diagnosis not present

## 2021-11-09 DIAGNOSIS — I10 Essential (primary) hypertension: Secondary | ICD-10-CM | POA: Diagnosis not present

## 2021-11-09 DIAGNOSIS — E039 Hypothyroidism, unspecified: Secondary | ICD-10-CM | POA: Diagnosis not present

## 2021-11-09 DIAGNOSIS — R0602 Shortness of breath: Secondary | ICD-10-CM | POA: Diagnosis not present

## 2021-11-09 DIAGNOSIS — D696 Thrombocytopenia, unspecified: Secondary | ICD-10-CM | POA: Diagnosis not present

## 2021-11-10 DIAGNOSIS — H6063 Unspecified chronic otitis externa, bilateral: Secondary | ICD-10-CM | POA: Diagnosis not present

## 2021-11-10 DIAGNOSIS — H6123 Impacted cerumen, bilateral: Secondary | ICD-10-CM | POA: Diagnosis not present

## 2021-11-17 ENCOUNTER — Encounter: Payer: Self-pay | Admitting: Pain Medicine

## 2021-11-17 ENCOUNTER — Ambulatory Visit
Admission: RE | Admit: 2021-11-17 | Discharge: 2021-11-17 | Disposition: A | Discharge status: Home / Self Care | Payer: PRIVATE HEALTH INSURANCE | Source: Ambulatory Visit | Attending: Pain Medicine | Admitting: Pain Medicine

## 2021-11-17 ENCOUNTER — Ambulatory Visit: Payer: Worker's Compensation | Attending: Pain Medicine | Admitting: Pain Medicine

## 2021-11-17 VITALS — BP 132/76 | HR 66 | Temp 97.4°F | Resp 14 | Ht 72.0 in | Wt 191.0 lb

## 2021-11-17 DIAGNOSIS — M961 Postlaminectomy syndrome, not elsewhere classified: Secondary | ICD-10-CM | POA: Insufficient documentation

## 2021-11-17 DIAGNOSIS — G8929 Other chronic pain: Secondary | ICD-10-CM | POA: Insufficient documentation

## 2021-11-17 DIAGNOSIS — M21372 Foot drop, left foot: Secondary | ICD-10-CM | POA: Insufficient documentation

## 2021-11-17 DIAGNOSIS — M542 Cervicalgia: Secondary | ICD-10-CM | POA: Diagnosis not present

## 2021-11-17 DIAGNOSIS — M5136 Other intervertebral disc degeneration, lumbar region: Secondary | ICD-10-CM | POA: Diagnosis not present

## 2021-11-17 DIAGNOSIS — M5417 Radiculopathy, lumbosacral region: Secondary | ICD-10-CM | POA: Diagnosis not present

## 2021-11-17 DIAGNOSIS — M5442 Lumbago with sciatica, left side: Secondary | ICD-10-CM | POA: Diagnosis not present

## 2021-11-17 DIAGNOSIS — M5030 Other cervical disc degeneration, unspecified cervical region: Secondary | ICD-10-CM | POA: Diagnosis not present

## 2021-11-17 DIAGNOSIS — M79605 Pain in left leg: Secondary | ICD-10-CM | POA: Insufficient documentation

## 2021-11-17 DIAGNOSIS — R937 Abnormal findings on diagnostic imaging of other parts of musculoskeletal system: Secondary | ICD-10-CM | POA: Insufficient documentation

## 2021-11-17 MED ORDER — ROPIVACAINE HCL 2 MG/ML IJ SOLN
2.0000 mL | Freq: Once | INTRAMUSCULAR | Status: AC
  Administered 2021-11-17: 2 mL via EPIDURAL

## 2021-11-17 MED ORDER — MIDAZOLAM HCL 5 MG/5ML IJ SOLN
INTRAMUSCULAR | Status: AC
  Filled 2021-11-17: qty 5

## 2021-11-17 MED ORDER — SODIUM CHLORIDE (PF) 0.9 % IJ SOLN
INTRAMUSCULAR | Status: AC
  Filled 2021-11-17: qty 10

## 2021-11-17 MED ORDER — TRIAMCINOLONE ACETONIDE 40 MG/ML IJ SUSP
40.0000 mg | Freq: Once | INTRAMUSCULAR | Status: AC
  Administered 2021-11-17: 40 mg

## 2021-11-17 MED ORDER — LIDOCAINE HCL (PF) 2 % IJ SOLN
INTRAMUSCULAR | Status: AC
  Filled 2021-11-17: qty 5

## 2021-11-17 MED ORDER — IOHEXOL 180 MG/ML  SOLN
10.0000 mL | Freq: Once | INTRAMUSCULAR | Status: AC
  Administered 2021-11-17: 10 mL via EPIDURAL

## 2021-11-17 MED ORDER — TRIAMCINOLONE ACETONIDE 40 MG/ML IJ SUSP
INTRAMUSCULAR | Status: AC
  Filled 2021-11-17: qty 1

## 2021-11-17 MED ORDER — FENTANYL CITRATE (PF) 100 MCG/2ML IJ SOLN
25.0000 ug | INTRAMUSCULAR | Status: DC | PRN
  Administered 2021-11-17: 25 ug via INTRAVENOUS

## 2021-11-17 MED ORDER — PENTAFLUOROPROP-TETRAFLUOROETH EX AERO
INHALATION_SPRAY | Freq: Once | CUTANEOUS | Status: DC
  Filled 2021-11-17: qty 116

## 2021-11-17 MED ORDER — ROPIVACAINE HCL 2 MG/ML IJ SOLN
INTRAMUSCULAR | Status: AC
  Filled 2021-11-17: qty 20

## 2021-11-17 MED ORDER — MIDAZOLAM HCL 5 MG/5ML IJ SOLN
0.5000 mg | Freq: Once | INTRAMUSCULAR | Status: AC
  Administered 2021-11-17: 1 mg via INTRAVENOUS

## 2021-11-17 MED ORDER — FENTANYL CITRATE (PF) 100 MCG/2ML IJ SOLN
INTRAMUSCULAR | Status: AC
  Filled 2021-11-17: qty 2

## 2021-11-17 MED ORDER — LACTATED RINGERS IV SOLN: Freq: Once | INTRAVENOUS | Status: AC

## 2021-11-17 MED ORDER — LIDOCAINE HCL 2 % IJ SOLN
20.0000 mL | Freq: Once | INTRAMUSCULAR | Status: AC
  Administered 2021-11-17: 100 mg

## 2021-11-17 MED ORDER — IOHEXOL 180 MG/ML  SOLN
INTRAMUSCULAR | Status: AC
  Filled 2021-11-17: qty 10

## 2021-11-17 MED ORDER — SODIUM CHLORIDE 0.9% FLUSH
2.0000 mL | Freq: Once | INTRAVENOUS | Status: AC
  Administered 2021-11-17: 2 mL

## 2021-11-17 NOTE — Patient Instructions (Addendum)
____________________________________________________________________________________________  Post-Procedure Discharge Instructions  Instructions: Apply ice:  Purpose: This will minimize any swelling and discomfort after procedure.  When: Day of procedure, as soon as you get home. How: Fill a plastic sandwich bag with crushed ice. Cover it with a small towel and apply to injection site. How long: (15 min on, 15 min off) Apply for 15 minutes then remove x 15 minutes.  Repeat sequence on day of procedure, until you go to bed. Apply heat:  Purpose: To treat any soreness and discomfort from the procedure. When: Starting the next day after the procedure. How: Apply heat to procedure site starting the day following the procedure. How long: May continue to repeat daily, until discomfort goes away. Food intake: Start with clear liquids (like water) and advance to regular food, as tolerated.  Physical activities: Keep activities to a minimum for the first 8 hours after the procedure. After that, then as tolerated. Driving: If you have received any sedation, be responsible and do not drive. You are not allowed to drive for 24 hours after having sedation. Blood thinner: (Applies only to those taking blood thinners) You may restart your blood thinner 6 hours after your procedure. Insulin: (Applies only to Diabetic patients taking insulin) As soon as you can eat, you may resume your normal dosing schedule. Infection prevention: Keep procedure site clean and dry. Shower daily and clean area with soap and water. Post-procedure Pain Diary: Extremely important that this be done correctly and accurately. Recorded information will be used to determine the next step in treatment. For the purpose of accuracy, follow these rules: Evaluate only the area treated. Do not report or include pain from an untreated area. For the purpose of this evaluation, ignore all other areas of pain, except for the treated area. After  your procedure, avoid taking a long nap and attempting to complete the pain diary after you wake up. Instead, set your alarm clock to go off every hour, on the hour, for the initial 8 hours after the procedure. Document the duration of the numbing medicine, and the relief you are getting from it. Do not go to sleep and attempt to complete it later. It will not be accurate. If you received sedation, it is likely that you were given a medication that may cause amnesia. Because of this, completing the diary at a later time may cause the information to be inaccurate. This information is needed to plan your care. Follow-up appointment: Keep your post-procedure follow-up evaluation appointment after the procedure (usually 2 weeks for most procedures, 6 weeks for radiofrequencies). DO NOT FORGET to bring you pain diary with you.   Expect: (What should I expect to see with my procedure?) From numbing medicine (AKA: Local Anesthetics): Numbness or decrease in pain. You may also experience some weakness, which if present, could last for the duration of the local anesthetic. Onset: Full effect within 15 minutes of injected. Duration: It will depend on the type of local anesthetic used. On the average, 1 to 8 hours.  From steroids (Applies only if steroids were used): Decrease in swelling or inflammation. Once inflammation is improved, relief of the pain will follow. Onset of benefits: Depends on the amount of swelling present. The more swelling, the longer it will take for the benefits to be seen. In some cases, up to 10 days. Duration: Steroids will stay in the system x 2 weeks. Duration of benefits will depend on multiple posibilities including persistent irritating factors. Side-effects: If present, they  may typically last 2 weeks (the duration of the steroids). Frequent: Cramps (if they occur, drink Gatorade and take over-the-counter Magnesium 450-500 mg once to twice a day); water retention with temporary  weight gain; increases in blood sugar; decreased immune system response; increased appetite. Occasional: Facial flushing (red, warm cheeks); mood swings; menstrual changes. Uncommon: Long-term decrease or suppression of natural hormones; bone thinning. (These are more common with higher doses or more frequent use. This is why we prefer that our patients avoid having any injection therapies in other practices.)  Very Rare: Severe mood changes; psychosis; aseptic necrosis. From procedure: Some discomfort is to be expected once the numbing medicine wears off. This should be minimal if ice and heat are applied as instructed.  Call if: (When should I call?) You experience numbness and weakness that gets worse with time, as opposed to wearing off. New onset bowel or bladder incontinence. (Applies only to procedures done in the spine)  Emergency Numbers: Durning business hours (Monday - Thursday, 8:00 AM - 4:00 PM) (Friday, 9:00 AM - 12:00 Noon): [phone] After hours: [phone] NOTE: If you are having a problem and are unable connect with, or to talk to a provider, then go to your nearest urgent care or emergency department. If the problem is serious and urgent, please call 911. ____________________________________________________________________________________________ ______________________________________________________________________  Preparing for Procedure with Sedation  NOTICE: Due to recent regulatory changes, starting on September 06, 2020, procedures requiring intravenous (IV) sedation will no longer be performed at the [facility].  These types of procedures are required to be performed at [facility] ambulatory surgery facility.  We are very sorry for the inconvenience.  Procedure appointments are limited to planned procedures: No Prescription Refills. No disability issues will be discussed. No medication changes will be discussed.  Instructions: Oral Intake: Do not eat or  drink anything for at least 8 hours prior to your procedure. (Exception: Blood Pressure Medication. See below.) Transportation: A driver is required. You may not drive yourself after the procedure. Blood Pressure Medicine: Do not forget to take your blood pressure medicine with a sip of water the morning of the procedure. If your Diastolic (lower reading) is above 100 mmHg, elective cases will be cancelled/rescheduled. Blood thinners: These will need to be stopped for procedures. Notify our staff if you are taking any blood thinners. Depending on which one you take, there will be specific instructions on how and when to stop it. Diabetics on insulin: Notify the staff so that you can be scheduled 1st case in the morning. If your diabetes requires high dose insulin, take only  of your normal insulin dose the morning of the procedure and notify the staff that you have done so. Preventing infections: Shower with an antibacterial soap the morning of your procedure. Build-up your immune system: Take 1000 mg of Vitamin C with every meal (3 times a day) the day prior to your procedure. Antibiotics: Inform the staff if you have a condition or reason that requires you to take antibiotics before dental procedures. Pregnancy: If you are pregnant, call and cancel the procedure. Sickness: If you have a cold, fever, or any active infections, call and cancel the procedure. Arrival: You must be in the facility at least 30 minutes prior to your scheduled procedure. Children: Do not bring children with you. Dress appropriately: There is always the possibility that your clothing may get soiled. Valuables: Do not bring any jewelry or valuables.  Reasons to call and reschedule or cancel  your procedure: (Following these recommendations will minimize the risk of a serious complication.) Surgeries: Avoid having procedures within 2 weeks of any surgery. (Avoid for 2 weeks before or after any surgery). Flu Shots: Avoid  having procedures within 2 weeks of a flu shots. (Avoid for 2 weeks before or after immunizations). Barium: Avoid having a procedure within 7-10 days after having had a radiological study involving the use of radiological contrast. (Myelograms, Barium swallow or enema study). Heart attacks: Avoid any elective procedures or surgeries for the initial 6 months after a "Myocardial Infarction" (Heart Attack). Blood thinners: It is imperative that you stop these medications before procedures. Let us know if you if you take any blood thinner.  Infection: Avoid procedures during or within two weeks of an infection (including chest colds or gastrointestinal problems). Symptoms associated with infections include: Localized redness, fever, chills, night sweats or profuse sweating, burning sensation when voiding, cough, congestion, stuffiness, runny nose, sore throat, diarrhea, nausea, vomiting, cold or Flu symptoms, recent or current infections. It is specially important if the infection is over the area that we intend to treat. Heart and lung problems: Symptoms that may suggest an active cardiopulmonary problem include: cough, chest pain, breathing difficulties or shortness of breath, dizziness, ankle swelling, uncontrolled high or unusually low blood pressure, and/or palpitations. If you are experiencing any of these symptoms, cancel your procedure and contact your primary care physician for an evaluation.  Remember:  Regular Business hours are:  Monday to Thursday 8:00 AM to 4:00 PM  Provider's Schedule: Milinda Pointer, MD:  Procedure days: Tuesday and Thursday 7:30 AM to 4:00 PM  Gillis Santa, MD:  Procedure days: Monday and Wednesday 7:30 AM to 4:00 PM ______________________________________________________________________  ____________________________________________________________________________________________  General Risks and Possible Complications  Patient Responsibilities: It is important  that you read this as it is part of your informed consent. It is our duty to inform you of the risks and possible complications associated with treatments offered to you. It is your responsibility as a patient to read this and to ask questions about anything that is not clear or that you believe was not covered in this document.  Patient's Rights: You have the right to refuse treatment. You also have the right to change your mind, even after initially having agreed to have the treatment done. However, under this last option, if you wait until the last second to change your mind, you may be charged for the materials used up to that point.  Introduction: Medicine is not an Chief Strategy Officer. Everything in Medicine, including the lack of treatment(s), carries the potential for danger, harm, or loss (which is by definition: Risk). In Medicine, a complication is a secondary problem, condition, or disease that can aggravate an already existing one. All treatments carry the risk of possible complications. The fact that a side effects or complications occurs, does not imply that the treatment was conducted incorrectly. It must be clearly understood that these can happen even when everything is done following the highest safety standards.  No treatment: You can choose not to proceed with the proposed treatment alternative. The "PRO(s)" would include: avoiding the risk of complications associated with the therapy. The "CON(s)" would include: not getting any of the treatment benefits. These benefits fall under one of three categories: diagnostic; therapeutic; and/or palliative. Diagnostic benefits include: getting information which can ultimately lead to improvement of the disease or symptom(s). Therapeutic benefits are those associated with the successful treatment of the disease. Finally, palliative benefits  are those related to the decrease of the primary symptoms, without necessarily curing the condition (example:  decreasing the pain from a flare-up of a chronic condition, such as incurable terminal cancer).  General Risks and Complications: These are associated to most interventional treatments. They can occur alone, or in combination. They fall under one of the following six (6) categories: no benefit or worsening of symptoms; bleeding; infection; nerve damage; allergic reactions; and/or death. No benefits or worsening of symptoms: In Medicine there are no guarantees, only probabilities. No healthcare provider can ever guarantee that a medical treatment will work, they can only state the probability that it may. Furthermore, there is always the possibility that the condition may worsen, either directly, or indirectly, as a consequence of the treatment. Bleeding: This is more common if the patient is taking a blood thinner, either prescription or over the counter (example: Goody Powders, Fish oil, Aspirin, Garlic, etc.), or if suffering a condition associated with impaired coagulation (example: Hemophilia, cirrhosis of the liver, low platelet counts, etc.). However, even if you do not have one on these, it can still happen. If you have any of these conditions, or take one of these drugs, make sure to notify your treating physician. Infection: This is more common in patients with a compromised immune system, either due to disease (example: diabetes, cancer, human immunodeficiency virus [HIV], etc.), or due to medications or treatments (example: therapies used to treat cancer and rheumatological diseases). However, even if you do not have one on these, it can still happen. If you have any of these conditions, or take one of these drugs, make sure to notify your treating physician. Nerve Damage: This is more common when the treatment is an invasive one, but it can also happen with the use of medications, such as those used in the treatment of cancer. The damage can occur to small secondary nerves, or to large primary  ones, such as those in the spinal cord and brain. This damage may be temporary or permanent and it may lead to impairments that can range from temporary numbness to permanent paralysis and/or brain death. Allergic Reactions: Any time a substance or material comes in contact with our body, there is the possibility of an allergic reaction. These can range from a mild skin rash (contact dermatitis) to a severe systemic reaction (anaphylactic reaction), which can result in death. Death: In general, any medical intervention can result in death, most of the time due to an unforeseen complication. ____________________________________________________________________________________________

## 2021-11-17 NOTE — Progress Notes (Signed)
Safety precautions to be maintained throughout the outpatient stay will include: orient to surroundings, keep bed in low position, maintain call bell within reach at all times, provide assistance with transfer out of bed and ambulation.  

## 2021-11-17 NOTE — Progress Notes (Signed)
PROVIDER NOTE: Interpretation of information contained herein should be left to medically-trained personnel. Specific patient instructions are provided elsewhere under "Patient Instructions" section of medical record. This document was created in part using STT-dictation technology, any transcriptional errors that may result from this process are unintentional.  Patient: Chase Mendez Type: Established DOB: 05/27/1934 MRN: [id number] PCP: Jodi Marble, MD  Service: Procedure DOS: 11/17/2021 Setting: Ambulatory Location: Ambulatory outpatient facility Delivery: Face-to-face Provider: Gaspar Cola, MD Specialty: Interventional Pain Management Specialty designation: 09 Location: Outpatient facility Ref. Prov.: Jodi Marble, MD    Interventional Therapy      Procedure: Caudal Epidural Steroid Injection #1  Laterality: Midline aiming left Level: Sacrococcygeal ligament  Imaging: Fluoroscopy-guided         Anesthesia: Local anesthesia (1-2% Lidocaine) Anxiolysis: IV Versed 1.0 mg Sedation: Moderate Sedation Fentanyl 0.5 mL (25 mcg) DOS: 11/17/2021  Performed by: Gaspar Cola, MD  Purpose: Diagnostic/Therapeutic Indications: Low back and lower extremity pain severe enough to impact quality of life or function. Rationale (medical necessity): procedure needed and proper for the diagnosis and/or treatment of Chase Mendez medical symptoms and needs. 1. Chronic low back pain (2ry area of Pain) (Bilateral) (ML) (L>R) w/ sciatica (Left)   2. Chronic lower extremity pain (1ry area of Pain) (Left)   3. Failed back surgical syndrome   4. DDD (degenerative disc disease), lumbar   5. Foot drop (Left)   6. Lower limb pain, inferior (L5) (Foot Drop) (Left)   7. Lumbosacral radiculopathy at L5 (Left)   8. Abnormal MRI, thoracic spine (02/12/2018)    NAS-11 Pain score:   Pre-procedure: 6 /10   Post-procedure: 0-No pain/10     Target: Lumbosacral epidural canal   Location: Epidural space  Region: Caudal canal Approach: Percutaneous  Type of procedure: Epidural block  Position  Prep  Materials:  Position: Prone  Prep solution: DuraPrep (Iodine Povacrylex [0.7% available iodine] and Isopropyl Alcohol, 74% w/w) Prep Area: Entire posterior lumbosacral area  Materials:  Tray: Epidural Needle(s):  Type: Epidural  Gauge (G): 17  Length: 3.5-in  Qty: 1  Pre-op H&P Assessment:  Chase Mendez is a 87 y.o. (year old), male patient, seen today for interventional treatment. He  has a past surgical history that includes Cholecystectomy; Esophagogastroduodenoscopy (egd) with propofol (N/A, 04/24/2017); Back surgery (1995, 1996); Eye surgery (Bilateral, 1983, 1985); Appendectomy; and Spinal cord stimulator insertion (N/A, 03/13/2018). Chase Mendez has a current medication list which includes the following prescription(s): allopurinol, amlodipine, aspirin, atorvastatin, duloxetine, isosorbide mononitrate, levothyroxine, losartan, myrbetriq, ondansetron, oxycodone hcl, [START ON 12/11/2021] oxycodone hcl, [START ON 01/10/2022] oxycodone hcl, tamsulosin, testosterone, cyanocobalamin, vitamin e, vraylar, gabapentin, pantoprazole, and [DISCONTINUED] diphenhydramine, and the following Facility-Administered Medications: fentanyl and pentafluoroprop-tetrafluoroeth. His primarily concern today is the Back Pain  Initial Vital Signs:  Pulse/HCG Rate: 66ECG Heart Rate: 70 (NSR) Temp: 97.6 F (36.4 C) Resp: 16 BP: 131/68 SpO2: 96 %  BMI: Estimated body mass index is 25.9 kg/m as calculated from the following:   Height as of this encounter: 6' (1.829 m).   Weight as of this encounter: 191 lb (86.6 kg).  Risk Assessment: Allergies: Reviewed. He is allergic to doxycycline, ibuprofen, and sulfa antibiotics.  Allergy Precautions: None required Coagulopathies: Reviewed. None identified.  Blood-thinner therapy: None at this time Active Infection(s): Reviewed. None identified.  Chase Mendez is afebrile  Site Confirmation: Chase Mendez was asked to confirm the procedure and laterality before marking the site Procedure checklist: Completed Consent: Before the procedure and under  the influence of no sedative(s), amnesic(s), or anxiolytics, the patient was informed of the treatment options, risks and possible complications. To fulfill our ethical and legal obligations, as recommended by the American Medical Association's Code of Ethics, I have informed the patient of my clinical impression; the nature and purpose of the treatment or procedure; the risks, benefits, and possible complications of the intervention; the alternatives, including doing nothing; the risk(s) and benefit(s) of the alternative treatment(s) or procedure(s); and the risk(s) and benefit(s) of doing nothing. The patient was provided information about the general risks and possible complications associated with the procedure. These may include, but are not limited to: failure to achieve desired goals, infection, bleeding, organ or nerve damage, allergic reactions, paralysis, and death. In addition, the patient was informed of those risks and complications associated to Spine-related procedures, such as failure to decrease pain; infection (i.e.: Meningitis, epidural or intraspinal abscess); bleeding (i.e.: epidural hematoma, subarachnoid hemorrhage, or any other type of intraspinal or peri-dural bleeding); organ or nerve damage (i.e.: Any type of peripheral nerve, nerve root, or spinal cord injury) with subsequent damage to sensory, motor, and/or autonomic systems, resulting in permanent pain, numbness, and/or weakness of one or several areas of the body; allergic reactions; (i.e.: anaphylactic reaction); and/or death. Furthermore, the patient was informed of those risks and complications associated with the medications. These include, but are not limited to: allergic reactions (i.e.: anaphylactic or anaphylactoid  reaction(s)); adrenal axis suppression; blood sugar elevation that in diabetics may result in ketoacidosis or comma; water retention that in patients with history of congestive heart failure may result in shortness of breath, pulmonary edema, and decompensation with resultant heart failure; weight gain; swelling or edema; medication-induced neural toxicity; particulate matter embolism and blood vessel occlusion with resultant organ, and/or nervous system infarction; and/or aseptic necrosis of one or more joints. Finally, the patient was informed that Medicine is not an exact science; therefore, there is also the possibility of unforeseen or unpredictable risks and/or possible complications that may result in a catastrophic outcome. The patient indicated having understood very clearly. We have given the patient no guarantees and we have made no promises. Enough time was given to the patient to ask questions, all of which were answered to the patient's satisfaction. Mr. Leverich has indicated that he wanted to continue with the procedure. Attestation: I, the ordering provider, attest that I have discussed with the patient the benefits, risks, side-effects, alternatives, likelihood of achieving goals, and potential problems during recovery for the procedure that I have provided informed consent. Date  Time: 11/17/2021  8:03 AM  Imaging Guidance (Spinal):          Type of Imaging Technique: Fluoroscopy Guidance (Spinal) Indication(s): Assistance in needle guidance and placement for procedures requiring needle placement in or near specific anatomical locations not easily accessible without such assistance. Exposure Time: Please see nurses notes. Contrast: Before injecting any contrast, we confirmed that the patient did not have an allergy to iodine, shellfish, or radiological contrast. Once satisfactory needle placement was completed at the desired level, radiological contrast was injected. Contrast injected  under live fluoroscopy. No contrast complications. See chart for type and volume of contrast used. Fluoroscopic Guidance: I was personally present during the use of fluoroscopy. "Tunnel Vision Technique" used to obtain the best possible view of the target area. Parallax error corrected before commencing the procedure. "Direction-depth-direction" technique used to introduce the needle under continuous pulsed fluoroscopy. Once target was reached, antero-posterior, oblique, and lateral fluoroscopic projection used  confirm needle placement in all planes. Images permanently stored in EMR. Interpretation: I personally interpreted the imaging intraoperatively. Adequate needle placement confirmed in multiple planes. Appropriate spread of contrast into desired area was observed. No evidence of afferent or efferent intravascular uptake. No intrathecal or subarachnoid spread observed. Permanent images saved into the patient's record.  Pre-Procedure Preparation:  Monitoring: As per clinic protocol. Respiration, ETCO2, SpO2, BP, heart rate and rhythm monitor placed and checked for adequate function Safety Precautions: Patient was assessed for positional comfort and pressure points before starting the procedure. Time-out: I initiated and conducted the "Time-out" before starting the procedure, as per protocol. The patient was asked to participate by confirming the accuracy of the "Time Out" information. Verification of the correct person, site, and procedure were performed and confirmed by me, the nursing staff, and the patient. "Time-out" conducted as per Joint Commission's Universal Protocol (UP.01.01.01). Time: 0833  Description  Narrative of Procedure:          Procedural Technique Safety Precautions: Aspiration looking for blood return was conducted prior to all injections. At no point did we inject any substances, as a needle was being advanced. No attempts were made at seeking any paresthesias. Safe injection  practices and needle disposal techniques used. Medications properly checked for expiration dates. SDV (single dose vial) medications used. Description of the Procedure: Protocol guidelines were followed. The patient was assisted into a comfortable position. The target area was identified and the area prepped in the usual manner. Skin & deeper tissues infiltrated with local anesthetic. Appropriate amount of time allowed to pass for local anesthetics to take effect. The procedure needles were then advanced to the target area. Proper needle placement secured. Negative aspiration confirmed. Solution injected in intermittent fashion, asking for systemic symptoms every 0.5cc of injectate. The needles were then removed and the area cleansed, making sure to leave some of the prepping solution back to take advantage of its long term bactericidal properties.  Technical description of procedure:  Safety Precautions: Aspiration looking for blood return was conducted prior to all injections. At no point did we inject any substances, as a needle was being advanced. No attempts were made at seeking any paresthesias. Safe injection practices and needle disposal techniques used. Medications properly checked for expiration dates. SDV (single dose vial) medications used. Description of the Procedure: Protocol guidelines were followed. The patient was placed in position over the fluoroscopy table. The target area was identified and the area prepped in the usual manner. Skin & deeper tissues infiltrated with local anesthetic. Appropriate amount of time allowed to pass for local anesthetics to take effect. The procedure needle was then advanced to the target area. Proper needle placement secured. Negative aspiration confirmed. Solution injected in intermittent fashion, asking for systemic symptoms every 0.5cc of injectate. The needles were then removed and the area cleansed, making sure to leave some of the prepping solution back to  take advantage of its long term bactericidal properties.  Vitals:   11/17/21 0839 11/17/21 0845 11/17/21 0855 11/17/21 0905  BP: 128/68 134/63 138/70 132/76  Pulse:      Resp: '18 16 18 14  '$ Temp:  97.6 F (36.4 C)  (!) 97.4 F (36.3 C)  SpO2: 100% 97% 96% 97%  Weight:      Height:         Start Time: 0833 hrs. End Time: 0839 hrs.  Post-operative Assessment:  Post-procedure Vital Signs:  Pulse/HCG Rate: 6670 Temp: (!) 97.4 F (36.3 C) Resp: 14 BP:  132/76 SpO2: 97 %  EBL: None  Complications: No immediate post-treatment complications observed by team, or reported by patient.  Note: The patient tolerated the entire procedure well. A repeat set of vitals were taken after the procedure and the patient was kept under observation following institutional policy, for this type of procedure. Post-procedural neurological assessment was performed, showing return to baseline, prior to discharge. The patient was provided with post-procedure discharge instructions, including a section on how to identify potential problems. Should any problems arise concerning this procedure, the patient was given instructions to immediately contact us, at any time, without hesitation. In any case, we plan to contact the patient by telephone for a follow-up status report regarding this interventional procedure.  Comments:  No additional relevant information.  Plan of Care  Orders:  Orders Placed This Encounter  Procedures   Caudal Epidural Injection    Scheduling Instructions:     Laterality: Left-sided     Level(s): Sacrococcygeal canal (Tailbone area)     Sedation: Patient's choice     Timeframe: Today    Order Specific Question:   Where will this procedure be performed?    Answer:   [facility]   Cervical Epidural Injection    Sedation: Patient's choice. Purpose: Diagnostic/Therapeutic Indication(s): Radiculitis and cervicalgia associater with cervical degenerative disc disease.     Standing Status:   Future    Standing Expiration Date:   02/17/2022    Scheduling Instructions:     Procedure: Cervical Epidural Steroid Injection/Block     Level(s): C7-T1     Laterality: Midline     Timeframe: As soon as schedule allows    Order Specific Question:   Where will this procedure be performed?    Answer:   [facility]    Comments:   by Dr. Fortunato Curling [facility] C-ARM 1-60 MIN NO REPORT    Intraoperative interpretation by procedural physician at [place].    Standing Status:   Standing    Number of Occurrences:   1    Order Specific Question:   Reason for exam:    Answer:   Assistance in needle guidance and placement for procedures requiring needle placement in or near specific anatomical locations not easily accessible without such assistance.   Informed Consent Details: Physician/Practitioner Attestation; Transcribe to consent form and obtain patient signature    Nursing Order: Transcribe to consent form and obtain patient signature. Note: Always confirm laterality of pain with Mr. Brosh, before procedure.    Order Specific Question:   Physician/Practitioner attestation of informed consent for procedure/surgical case    Answer:   I, the physician/practitioner, attest that I have discussed with the patient the benefits, risks, side effects, alternatives, likelihood of achieving goals and potential problems during recovery for the procedure that I have provided informed consent.    Order Specific Question:   Procedure    Answer:   Caudal epidural steroid injection    Order Specific Question:   Physician/Practitioner performing the procedure    Answer:   Ceil Roderick A. Dossie Arbour, MD    Order Specific Question:   Indication/Reason    Answer:   Low back pain and lower extremity pain secondary to lumbosacral radiculitis   Provide equipment / supplies at bedside    "Epidural Tray" (Disposable  single use) Catheter: NOT required    Standing Status:    Standing    Number of Occurrences:   1    Order Specific Question:  Specify    Answer:   Epidural Tray   Chronic Opioid Analgesic:  Oxycodone IR 10 mg, 1 tab PO q 6 hrs. (40 mg/day of oxycodone) MME/day: 60 mg/day.   Medications ordered for procedure: Meds ordered this encounter  Medications   iohexol (OMNIPAQUE) 180 MG/ML injection 10 mL    Must be Myelogram-compatible. If not available, you may substitute with a water-soluble, non-ionic, hypoallergenic, myelogram-compatible radiological contrast medium.   lidocaine (XYLOCAINE) 2 % (with pres) injection 400 mg   pentafluoroprop-tetrafluoroeth (GEBAUERS) aerosol   lactated ringers infusion   midazolam (VERSED) 5 MG/5ML injection 0.5-2 mg    Make sure Flumazenil is available in the pyxis when using this medication. If oversedation occurs, administer 0.2 mg IV over 15 sec. If after 45 sec no response, administer 0.2 mg again over 1 min; may repeat at 1 min intervals; not to exceed 4 doses (1 mg)   fentaNYL (SUBLIMAZE) injection 25-50 mcg    Make sure Narcan is available in the pyxis when using this medication. In the event of respiratory depression (RR< 8/min): Titrate NARCAN (naloxone) in increments of 0.1 to 0.2 mg IV at 2-3 minute intervals, until desired degree of reversal.   sodium chloride flush (NS) 0.9 % injection 2 mL   ropivacaine (PF) 2 mg/mL (0.2%) (NAROPIN) injection 2 mL   triamcinolone acetonide (KENALOG-40) injection 40 mg   Medications administered: We administered iohexol, lidocaine, lactated ringers, midazolam, fentaNYL, sodium chloride flush, ropivacaine (PF) 2 mg/mL (0.2%), and triamcinolone acetonide.  See the medical record for exact dosing, route, and time of administration.  Follow-up plan:   Return in about 2 weeks (around 12/01/2021) for procedure (ECT): (ML) CESI #1, (PPE).       Interventional Therapies  Risk  Complexity Considerations:   Estimated body mass index is 26.04 kg/m as calculated from the  following:   Height as of this encounter: 6' (1.829 m).   Weight as of this encounter: 192 lb (87.1 kg). WNL   Planned  Pending:   Diagnostic/therapeutic cervical ESI #1 (after caudal)    Under consideration:   Diagnostic Caudal ESI + epidurogram #1  Possible Racz procedure  Diagnostic bilateral lumbar facet MBB  Possible bilateral lumbar facet RFA  Possible candidate for intrathecal pump trial and implant    Completed:   Diagnostic/therapeutic left caudal ESI + epidurogram x1 (11/17/2021)    Therapeutic  Palliative (PRN) options:   Therapeutic/palliative left L4 TFESI #2  Therapeutic/palliative left L5 TFESI #2  Bilateral spinal cord stimulator trial (done - 01/17/2018)  Permanent bilateral spinal cord stimulator implant by Dr. Cari Caraway (neurosurgery) (done - 03/13/2018)    Recent Visits Date Type Provider Dept  11/08/21 Office Visit Milinda Pointer, MD [facility]  Showing recent visits within past 90 days and meeting all other requirements Today's Visits Date Type Provider Dept  11/17/21 Procedure visit Milinda Pointer, MD [facility]  Showing today's visits and meeting all other requirements Future Appointments Date Type Provider Dept  12/01/21 Appointment Milinda Pointer, MD [facility]  02/01/22 Appointment Milinda Pointer, MD [facility]  Showing future appointments within next 90 days and meeting all other requirements  Disposition: Discharge home  Discharge (Date  Time): 11/17/2021; 0915 hrs.   Primary Care Physician: Jodi Marble, MD Location: [facility] Note by: Gaspar Cola, MD Date: 11/17/2021; Time: 9:52 AM  Disclaimer:  Medicine is not an Chief Strategy Officer. The only guarantee in medicine is that nothing is guaranteed.  It is important to note that the decision to proceed with this intervention was based on the information collected from the patient. The Data and  conclusions were drawn from the patient's questionnaire, the interview, and the physical examination. Because the information was provided in large part by the patient, it cannot be guaranteed that it has not been purposely or unconsciously manipulated. Every effort has been made to obtain as much relevant data as possible for this evaluation. It is important to note that the conclusions that lead to this procedure are derived in large part from the available data. Always take into account that the treatment will also be dependent on availability of resources and existing treatment guidelines, considered by other Pain Management Practitioners as being common knowledge and practice, at the time of the intervention. For Medico-Legal purposes, it is also important to point out that variation in procedural techniques and pharmacological choices are the acceptable norm. The indications, contraindications, technique, and results of the above procedure should only be interpreted and judged by a Board-Certified Interventional Pain Specialist with extensive familiarity and expertise in the same exact procedure and technique.

## 2021-11-18 ENCOUNTER — Telehealth: Payer: Self-pay

## 2021-11-18 NOTE — Telephone Encounter (Signed)
Post procedure phone call.  Patient states he is doing ok today.

## 2021-12-01 ENCOUNTER — Telehealth: Payer: Self-pay

## 2021-12-01 ENCOUNTER — Ambulatory Visit: Payer: Self-pay | Admitting: Pain Medicine

## 2021-12-01 NOTE — Telephone Encounter (Signed)
Insurance is requiring documentation that there is pain traveling to the arm in a pattern related to the level of spine to be treated.. there is not much said in the notes that you ordered the CESI. Please add documentation so I can get this authorized.

## 2021-12-06 DIAGNOSIS — K219 Gastro-esophageal reflux disease without esophagitis: Secondary | ICD-10-CM | POA: Diagnosis not present

## 2021-12-06 DIAGNOSIS — E785 Hyperlipidemia, unspecified: Secondary | ICD-10-CM | POA: Diagnosis not present

## 2021-12-06 DIAGNOSIS — I10 Essential (primary) hypertension: Secondary | ICD-10-CM | POA: Diagnosis not present

## 2021-12-08 DIAGNOSIS — B351 Tinea unguium: Secondary | ICD-10-CM | POA: Diagnosis not present

## 2021-12-08 DIAGNOSIS — M79674 Pain in right toe(s): Secondary | ICD-10-CM | POA: Diagnosis not present

## 2021-12-08 DIAGNOSIS — M79675 Pain in left toe(s): Secondary | ICD-10-CM | POA: Diagnosis not present

## 2021-12-09 NOTE — Progress Notes (Unsigned)
Psychiatric Initial Adult Assessment   Patient Identification: Chase Mendez MRN:  [id number] Date of Evaluation:  12/12/2021 Referral Source: Jodi Marble, MD  Chief Complaint:   Chief Complaint  Patient presents with   Establish Care   Visit Diagnosis:    ICD-10-CM   1. MDD (major depressive disorder), recurrent episode, moderate (HCC)  F33.1       History of Present Illness:   Chase Mendez is a 87 y.o. year old male with a history of chronic low back pain, diabetes, GERD, hypertension, hypothyroidism, MI , who is referred for depression.   He initially asks to be interviewed by himself.  He states that he suffers from depression.  He cannot drive, and he cannot get out often due to using a walker.  He also has decreased vision and hearing loss.  Although he has hearing aids, obtained from [place], it does not work well.  He states that he was in [employer].  3 out of 5 members have passed.  He misses them, and his siblings and his parents.  He thinks of them very often.  He also states that he has back and leg pain after injury.  Although he used to be doing very well with methadone, things has "fell apart" since not being able to get this medication.  Although he is on opioid, it is less helpful compared to methadone.  He reports great relationship with his wife ("great lady").  He also reports good relationship with his daughter, although he has estranged relationship with his son. He states that he "want to shake this." He then states that he has difficulty in remembering what he was trying to say, although he agrees that he wants to "get rid of" depression/sadness. He asks how many minutes left for the visit, and asks this Probation officer to invite his wife in the waiting room. Of note, he excused himself to take off his jacket (appropriately) in a calm manner, and was able to fold it down during the visit. The patient has mood symptoms as in PHQ-9/GAD-7. He denies SI. HI.   [name], his  wife joined at the last at the interview at the patient request.  She is aware that he has been depressed.  She also wonders if he might have dementia.  His PCP reportedly evaluated this, and was advised to keep monitoring this.  She talks about an episode of him getting confused about the family.  She also states that there was a time he ran out of his pain medication in a few days, although it was placed in pill box to last for a week.  She denies any safety concern to himself or others.   Head CT in 2020 FINDINGS: Brain: Age related volume loss. No focal abnormality affects the brainstem or cerebellum. Cerebral hemispheres show minimal small vessel change of the deep white matter. No cortical or large vessel territory infarction. No mass lesion, hemorrhage, hydrocephalus or extra-axial collection. After contrast administration, no abnormal enhancement occurs.  Wt Readings from Last 3 Encounters:  12/12/21 198 lb 9.6 oz (90.1 kg)  11/17/21 191 lb (86.6 kg)  11/08/21 190 lb (86.2 kg)      Functional Status Instrumental Activities of Daily Living (IADLs):  Chase Mendez is independent in the following:  Requires assistance with the following: driving, medications (his wife arranges in pill box), managing finances,   Activities of Daily Living (ADLs):  Chase Mendez is independent in the following: bathing (  uses shower chair), and hygiene, feeding, continence, grooming and toileting, walking (walker)   Household: wife Marital status: married for 40 years, second marriage Number of children: 2 (daughter, estranged relationship with his son) 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),  Education:  one year of college Last PCP / ongoing medical evaluation:     Associated Signs/Symptoms: Depression Symptoms:  depressed mood, anhedonia, fatigue, difficulty concentrating, (Hypo) Manic Symptoms:   denies decreased need in sleep, euphoria Anxiety Symptoms:   mild  anxiety Psychotic Symptoms:   denies AH, VH, paranoia PTSD Symptoms: Negative  Past Psychiatric History:  Outpatient:  Psychiatry admission: denies  Previous suicide attempt: denies  Past trials of medication: risperidone History of violence:  History of head injury: denies  Previous Psychotropic Medications: Yes   Substance Abuse History in the last 12 months:  No.  Consequences of Substance Abuse: NA  Past Medical History:  Past Medical History:  Diagnosis Date   Acute encephalopathy 12/08/2014   Anxiety    ARF (acute renal failure) ([place]) 12/08/2014   Back pain    Benign neoplasm of large bowel    Capsulitis    fractured displaced metatarsal with capsulitis   Chronic back pain    Coronavirus infection    Depression    Diabetes mellitus without complication ([place])    no medications currently   Dysphagia    Exostosis    painful, right hallux   Foot drop, left    wears a brace   Frequent falls 02/2018   poor balance, foot drop   GERD (gastroesophageal reflux disease)    Gout    Hypertension    Hypothyroidism    Insomnia    Low testosterone    Microscopic hematuria 2016   Myocardial infarction [facility])    patient unaware when it happened years ago.     Pneumonia 12/07/2014   Pressure ulcer 12/09/2014   Sepsis ([place]) 12/08/2014   Thyroid disease     Past Surgical History:  Procedure Laterality Date   [place]   x 2   CHOLECYSTECTOMY     ESOPHAGOGASTRODUODENOSCOPY (EGD) WITH PROPOFOL N/A 04/24/2017   Procedure: ESOPHAGOGASTRODUODENOSCOPY (EGD) WITH PROPOFOL;  Surgeon: Manya Silvas, MD;  Location: [facility] ENDOSCOPY;  Service: Endoscopy;  Laterality: N/A;   EYE SURGERY Bilateral 1983, 1985   cataract extractions   SPINAL CORD STIMULATOR INSERTION N/A 03/13/2018   Procedure: SPINAL CORD STIMULATOR INSERTION;  Surgeon: Meade Maw, MD;  Location: [facility] ORS;  Service: Neurosurgery;  Laterality: N/A;    Family Psychiatric  History: as below  Family History:  Family History  Problem Relation Age of Onset   Heart disease Mother    Diabetes Father    Kidney cancer Brother     Social History:   Social History   Socioeconomic History   Marital status: Married    Spouse name: dorothy   Number of children: 2   Years of education: Not on file   Highest education level: Some college, no degree  Occupational History   Occupation: maintenance / repair    Comment: disabled  Tobacco Use   Smoking status: Every Day    Packs/day: 0.50    Years: 70.00    Total pack years: 35.00    Types: Cigars, Cigarettes   Smokeless tobacco: Never   Tobacco comments:    unable to give cessation materials due to webex visit   Vaping Use   Vaping Use: Never used  Substance and Sexual Activity   Alcohol use: No   Drug use: Yes    Types: Oxycodone   Sexual activity: Not Currently  Other Topics Concern   Not on file  Social History Narrative   Not on file   Social Determinants of Health   Financial Resource Strain: Not on file  Food Insecurity: Not on file  Transportation Needs: Not on file  Physical Activity: Not on file  Stress: Not on file  Social Connections: Not on file    Additional Social History: as above  Allergies:   Allergies  Allergen Reactions   Doxycycline    Ibuprofen Nausea Only   Sulfa Antibiotics Nausea Only    Metabolic Disorder Labs: Lab Results  Component Value Date   HGBA1C 6.3 (H) 12/08/2014   No results found for: "PROLACTIN" Lab Results  Component Value Date   CHOL 121 12/08/2014   TRIG 115 12/08/2014   HDL 40 (L) 12/08/2014   CHOLHDL 3.0 12/08/2014   VLDL 23 12/08/2014   LDLCALC 58 12/08/2014   Lab Results  Component Value Date   TSH 1.425 01/27/2019    Therapeutic Level Labs: No results found for: "LITHIUM" No results found for: "CBMZ" No results found for: "VALPROATE"  Current Medications: Current Outpatient Medications  Medication Sig Dispense Refill    allopurinol (ZYLOPRIM) 100 MG tablet Take 100 mg by mouth daily.  0   amLODipine (NORVASC) 10 MG tablet Take 10 mg by mouth daily.     aspirin 81 MG tablet Take 81 mg by mouth daily.     atorvastatin (LIPITOR) 10 MG tablet Take 10 mg by mouth at bedtime.     DULoxetine (CYMBALTA) 30 MG capsule Take 30 mg by mouth daily.     isosorbide mononitrate (IMDUR) 30 MG 24 hr tablet Take 30 mg by mouth daily.     levothyroxine (SYNTHROID, LEVOTHROID) 125 MCG tablet Take 125 mcg by mouth daily before breakfast.  0   losartan (COZAAR) 100 MG tablet Take 100 mg by mouth daily.      ondansetron (ZOFRAN ODT) 4 MG disintegrating tablet Take 1 tablet (4 mg total) by mouth every 8 (eight) hours as needed for nausea or vomiting. 20 tablet 0   [START ON 01/10/2022] Oxycodone HCl 10 MG TABS Take 1 tablet (10 mg total) by mouth every 6 (six) hours as needed. Must last 30 days. 120 tablet 0   tamsulosin (FLOMAX) 0.4 MG CAPS capsule Take 0.4 mg by mouth daily.     Testosterone 40.5 MG/2.5GM (1.62%) GEL Place 2 Pump onto the skin 3 (three) times a week.     vitamin B-12 (CYANOCOBALAMIN) 1000 MCG tablet Take 1,000 mcg by mouth daily.     vitamin E 400 UNIT capsule Take 400 Units by mouth daily.     VRAYLAR 1.5 MG capsule Take 1.5 mg by mouth.     gabapentin (NEURONTIN) 600 MG tablet Take 1 tablet (600 mg total) by mouth 2 (two) times daily. 60 tablet 2   pantoprazole (PROTONIX) 40 MG tablet Take 40 mg by mouth daily.      No current facility-administered medications for this visit.    Musculoskeletal: Strength & Muscle Tone: decreased Gait & Station: normal (uses a walker) Patient leans: N/A  Psychiatric Specialty Exam: Review of Systems  Psychiatric/Behavioral:  Positive for decreased concentration, dysphoric mood and sleep disturbance. Negative for agitation, behavioral problems, confusion, hallucinations, self-injury and suicidal ideas. The patient is nervous/anxious. The patient is not hyperactive.  All other  systems reviewed and are negative.   Blood pressure 133/70, pulse 71, temperature 97.8 F (36.6 C), temperature source Oral, height 6' (1.829 m), weight 198 lb 9.6 oz (90.1 kg).Body mass index is 26.94 kg/m.  General Appearance: Fairly Groomed  Eye Contact:  Good  Speech:  Clear and Coherent, slightly increased latency due to word finding difficulty  Volume:  Normal  Mood:  Depressed  Affect:  Appropriate, Congruent, and down  Thought Process:  Coherent  Orientation:  Full (Time, Place, and Person)  Thought Content:  Logical  Suicidal Thoughts:  No  Homicidal Thoughts:  No  Memory:  Immediate;   Good  Judgement:  Good  Insight:  Present  Psychomotor Activity:  Decreased  Concentration:  Concentration: Poor and Attention Span: Good  Recall:  [place] of Knowledge:Good  Language: Good  Akathisia:  No  Handed:  Right  AIMS (if indicated):  not done  Assets:  Desire for Improvement Social Support  ADL's:  Intact  Cognition: Impaired,  Mild  Sleep:  Fair   Screenings: GAD-7    [place] Office Visit from 12/12/2021 in [place]  Total GAD-7 Score 10      PHQ2-9    [place] Visit from 12/12/2021 in [place] Visit from 11/08/2021 in [place] Office Visit from 08/03/2021 in [place] Office Visit from 05/17/2020 in [place] Procedure visit from 02/19/2020 in [place]  PHQ-2 Total Score 6 0 0 0 0  PHQ-9 Total Score 14 -- -- -- --      [place] Office Visit from 12/12/2021 in [place] ED from 03/10/2020 in [place] No Risk No Risk       Assessment and Plan:  Chase Mendez is a 87 y.o. year old male with a  history of chronic low back pain, diabetes, GERD, hypertension, hypothyroidism, MI , who is referred for depression.   1. MDD (major depressive disorder), recurrent episode, moderate (HCC) Reports depressive symptoms in the context of loss of his family members, and team members in the [employer].  He is also demoralized due to declining in health, which includes hearing loss, using a walker.  Although he will benefit from medication adjustment, the most recent EKG in the record (in 2020) was concerning for QTc prolongation in the context of Wide QRS, RBBB, bifascicular block.  He does have history of MI; will obtain record from his cardiologist, psychiatrist to see if readjust his medication.  Will continue current dose of duloxetine and Vraylar at this time to target depression.  Discussed potential risk of QTc prolongation, EPS, and metabolic side effect. Both the patient and his wife has agreed with plans.  Although he may benefit from CBT in the future, will hold at this time given his cognition might interferes with the therapy work.   # cognitive impairment Exam is notable for word-finding difficulty , and he does have impairment in IADL . His wife reports concern of dementia, and he was reportedly evaluated by his PCP for this. Will obtain record to avoid duplicate of evaluation. Will plan to assess with [facility], labs, if those are not done. There is no safety concern to self or others according to his wife.   Plan (he declined refills) Continue  duloxetine 30 mg daily  Continue Vraylar 1.5 mg daily  (EKG in 2020: Qtc 490 msec,  Wide QRS, RBBB, bifascicular block,  ) Obtain record form his cardiologist, PCP, Dr. Nicolasa Ducking  Obtain ROI to communicate with his wife, Chase Mendez Next appointment: 1/9 at 33 AM for one hour, in person  The patient demonstrates the following risk factors for suicide: Chronic risk factors for suicide include: psychiatric disorder of depression . Acute risk factors for suicide  include: unemployment and loss (financial, interpersonal, professional). Protective factors for this patient include: positive social support and hope for the future. Considering these factors, the overall suicide risk at this point appears to be low. Patient is appropriate for outpatient follow up.   Collaboration of Care: Other reviewed records in [place] was advised Release of Information must be obtained prior to any record release in order to collaborate their care with an outside provider. Patient/Guardian was advised if they have not already done so to contact the registration department to sign all necessary forms in order for [place] to release information regarding their care.   Consent: Patient/Guardian gives verbal consent for treatment and assignment of benefits for services provided during this visit. Patient/Guardian expressed understanding and agreed to proceed.   Norman Clay, MD 11/6/20231:04 PM

## 2021-12-12 ENCOUNTER — Ambulatory Visit (INDEPENDENT_AMBULATORY_CARE_PROVIDER_SITE_OTHER): Payer: Worker's Compensation | Admitting: Psychiatry

## 2021-12-12 ENCOUNTER — Encounter: Payer: Self-pay | Admitting: Psychiatry

## 2021-12-12 VITALS — BP 133/70 | HR 71 | Temp 97.8°F | Ht 72.0 in | Wt 198.6 lb

## 2021-12-12 DIAGNOSIS — F331 Major depressive disorder, recurrent, moderate: Secondary | ICD-10-CM

## 2021-12-12 DIAGNOSIS — R69 Illness, unspecified: Secondary | ICD-10-CM | POA: Diagnosis not present

## 2021-12-12 NOTE — Patient Instructions (Signed)
Continue duloxetine 30 mg daily  Continue Vraylar 1.5 mg daily  Obtain record form his cardiologist, PCP, Dr. Nicolasa Ducking  Obtain ROI to communicate with his wife, Earlie Server Next appointment: 1/9 at 11 AM

## 2021-12-19 ENCOUNTER — Telehealth: Payer: Self-pay | Admitting: Psychiatry

## 2021-12-19 NOTE — Telephone Encounter (Signed)
Reviewed a record from his PCP, including the note on 11/15/2021. Diagnosis includes depression. No detailed assessment regarding his cognition were found. Labs- TSH checked in 2022.

## 2021-12-20 ENCOUNTER — Ambulatory Visit: Payer: Self-pay | Admitting: Psychiatry

## 2021-12-25 NOTE — Progress Notes (Unsigned)
PROVIDER NOTE: Information contained herein reflects review and annotations entered in association with encounter. Interpretation of such information and data should be left to medically-trained personnel. Information provided to patient can be located elsewhere in the medical record under "Patient Instructions". Document created using STT-dictation technology, any transcriptional errors that may result from process are unintentional.    Patient: Chase Mendez  Service Category: E/M  Provider: Gaspar Cola, MD  DOB: 08-29-1934  DOS: 12/26/2021  Referring Provider: Jodi Marble, MD  MRN: [id number]  Specialty: Interventional Pain Management  PCP: Jodi Marble, MD  Type: Established Patient  Setting: Ambulatory outpatient    Location: Office  Delivery: Face-to-face     HPI  [name], a 87 y.o. year old male, is here today because of his No primary diagnosis found.. Mr. Luckman primary complain today is No chief complaint on file. Last encounter: My last encounter with him was on 11/17/2021. Pertinent problems: Mr. Poch has Spinal accessory neuropathy; Lower limb pain, inferior (L5) (Foot Drop) (Left); Chronic low back pain (2ry area of Pain) (Bilateral) (ML) (L>R) w/ sciatica (Left); Chronic lower extremity pain (1ry area of Pain) (Left); Failed back surgical syndrome; Foot drop (Left); Lumbosacral radiculopathy at L5 (Left); DDD (degenerative disc disease), lumbar; Chronic pain syndrome; Thoracic central spinal stenosis; Chronic musculoskeletal pain; Neurogenic pain; Presence of neurostimulator; Gout of left foot due to renal impairment; Disturbance of skin sensation; Cervicalgia; Cervical facet syndrome; Bilateral lower abdominal pain; Abnormal MRI, thoracic spine (02/12/2018); and DDD (degenerative disc disease), cervical on their pertinent problem list. Pain Assessment: Severity of   is reported as a  /10. Location:    / . Onset:  . Quality:  . Timing:  . Modifying  factor(s):  [name] Vitals:  vitals were not taken for this visit.   Reason for encounter:  *** . ***  Pharmacotherapy Assessment  Analgesic: Oxycodone IR 10 mg, 1 tab PO q 6 hrs. (40 mg/day of oxycodone) MME/day: 60 mg/day.   Monitoring: [place] PMP: PDMP reviewed during this encounter.       Pharmacotherapy: No side-effects or adverse reactions reported. Compliance: No problems identified. Effectiveness: Clinically acceptable.  No notes on file  No results found for: "CBDTHCR" No results found for: "D8THCCBX" No results found for: "D9THCCBX"  UDS:  Summary  Date Value Ref Range Status  08/03/2021 Note  Final    Comment:    ==================================================================== [employer] (MW) ==================================================================== Test                             Result       Flag       Units  Drug Present and Declared for Prescription Verification   Oxycodone                      3770         EXPECTED   ng/mg creat   Oxymorphone                    1689         EXPECTED   ng/mg creat   Noroxycodone                   4295         EXPECTED   ng/mg creat   Noroxymorphone  909          EXPECTED   ng/mg creat    Sources of oxycodone are scheduled prescription medications.    Oxymorphone, noroxycodone, and noroxymorphone are expected    metabolites of oxycodone. Oxymorphone is also available as a    scheduled prescription medication.  ==================================================================== Test                      Result    Flag   Units      Ref Range   Creatinine              148              mg/dL      >=20 ==================================================================== Declared Medications:  The flagging and interpretation on this report are based on the  following declared medications.  Unexpected results may arise from  inaccuracies in the declared medications.   **Note: The testing scope of  this panel includes these medications:   Oxycodone   **Note: The testing scope of this panel does not include the  following reported medications:   Allopurinol (Zyloprim)  Amlodipine (Norvasc)  Aspirin  Atorvastatin (Lipitor)  Cyanocobalamin  Diphenhydramine (Benadryl)  Duloxetine (Cymbalta)  Gabapentin (Neurontin)  Isosorbide (Imdur)  Levothyroxine (Synthroid)  Losartan (Cozaar)  Ondansetron (Zofran)  Pantoprazole (Protonix)  Tamsulosin (Flomax)  Testosterone  Vitamin E ==================================================================== For clinical consultation, please call [phone]. ====================================================================       ROS  Constitutional: Denies any fever or chills Gastrointestinal: No reported hemesis, hematochezia, vomiting, or acute GI distress Musculoskeletal: Denies any acute onset joint swelling, redness, loss of ROM, or weakness Neurological: No reported episodes of acute onset apraxia, aphasia, dysarthria, agnosia, amnesia, paralysis, loss of coordination, or loss of consciousness  Medication Review  DULoxetine, Oxycodone HCl, Testosterone, allopurinol, amLODipine, aspirin, atorvastatin, cariprazine, cyanocobalamin, diphenhydrAMINE, gabapentin, isosorbide mononitrate, levothyroxine, losartan, ondansetron, pantoprazole, tamsulosin, and vitamin E  History Review  Allergy: Mr. Gluth is allergic to doxycycline, ibuprofen, and sulfa antibiotics. Drug: Mr. Gulas  reports current drug use. Drug: Oxycodone. Alcohol:  reports no history of alcohol use. Tobacco:  reports that he has been smoking cigars and cigarettes. He has a 35.00 pack-year smoking history. He has never used smokeless tobacco. Social: Mr. Cech  reports that he has been smoking cigars and cigarettes. He has a 35.00 pack-year smoking history. He has never used smokeless tobacco. He reports current drug use. Drug: Oxycodone. He reports that he does not  drink alcohol. Medical:  has a past medical history of Acute encephalopathy (12/08/2014), Anxiety, ARF (acute renal failure) ([place]) (12/08/2014), Back pain, Benign neoplasm of large bowel, Capsulitis, Chronic back pain, Coronavirus infection, Depression, Diabetes mellitus without complication ([place]), Dysphagia, Exostosis, Foot drop, left, Frequent falls (02/2018), GERD (gastroesophageal reflux disease), Gout, Hypertension, Hypothyroidism, Insomnia, Low testosterone, Microscopic hematuria (2016), Myocardial infarction ([place]), Pneumonia (12/07/2014), Pressure ulcer (12/09/2014), Sepsis ([place]) (12/08/2014), and Thyroid disease. Surgical: Mr. Edmonston  has a past surgical history that includes Cholecystectomy; Esophagogastroduodenoscopy (egd) with propofol (N/A, 04/24/2017); Back surgery (1995, 1996); Eye surgery (Bilateral, 1983, 1985); Appendectomy; and Spinal cord stimulator insertion (N/A, 03/13/2018). Family: family history includes Diabetes in his father; Heart disease in his mother; Kidney cancer in his brother.  Laboratory Chemistry Profile   Renal Lab Results  Component Value Date   BUN 17 01/28/2019   CREATININE 1.30 (H) 12/29/2019   GFRAA >60 01/28/2019   GFRNONAA >60 01/28/2019    Hepatic Lab Results  Component Value Date  AST 75 (H) 11/28/2018   ALT 20 11/28/2018   ALBUMIN 3.9 11/28/2018   ALKPHOS 134 (H) 11/28/2018   HCVAB <0.1 07/24/2018   LIPASE 37 08/21/2016    Electrolytes Lab Results  Component Value Date   NA 140 01/28/2019   K 4.0 01/28/2019   CL 105 01/28/2019   CALCIUM 8.9 01/28/2019    Bone No results found for: "VD25OH", "VD125OH2TOT", "AO1308MV7", "QI6962XB2", "25OHVITD1", "25OHVITD2", "84XLKGMW1", "TESTOFREE", "TESTOSTERONE"  Inflammation (CRP: Acute Phase) (ESR: Chronic Phase) Lab Results  Component Value Date   LATICACIDVEN 1.1 12/08/2014         Note: Above Lab results reviewed.  Recent Imaging Review  DG PAIN CLINIC C-ARM 1-60 MIN NO REPORT Fluoro was used,  but no Radiologist interpretation will be provided.  Please refer to "NOTES" tab for provider progress note. Note: Reviewed        Physical Exam  General appearance: Well nourished, well developed, and well hydrated. In no apparent acute distress Mental status: Alert, oriented x 3 (person, place, & time)       Respiratory: No evidence of acute respiratory distress Eyes: PERLA Vitals: There were no vitals taken for this visit. BMI: Estimated body mass index is 26.94 kg/m as calculated from the following:   Height as of 12/12/21: 6' (1.829 m).   Weight as of 12/12/21: 198 lb 9.6 oz (90.1 kg). Ideal: Ideal body weight: 77.6 kg (171 lb 1.2 oz) Adjusted ideal body weight: 82.6 kg (182 lb 1.4 oz)  Assessment   Diagnosis Status  No diagnosis found. Controlled Controlled Controlled   Updated Problems: No problems updated.  Plan of Care  Problem-specific:  No problem-specific Assessment & Plan notes found for this encounter.  Mr. REXFORD PREVO has a current medication list which includes the following long-term medication(s): allopurinol, amlodipine, gabapentin, isosorbide mononitrate, levothyroxine, losartan, [START ON 01/10/2022] oxycodone hcl, pantoprazole, testosterone, and [DISCONTINUED] diphenhydramine.  Pharmacotherapy (Medications Ordered): No orders of the defined types were placed in this encounter.  Orders:  No orders of the defined types were placed in this encounter.  Follow-up plan:   No follow-ups on file.     Interventional Therapies  Risk  Complexity Considerations:   Estimated body mass index is 26.04 kg/m as calculated from the following:   Height as of this encounter: 6' (1.829 m).   Weight as of this encounter: 192 lb (87.1 kg). WNL   Planned  Pending:   Diagnostic/therapeutic cervical ESI #1 (after caudal)    Under consideration:   Diagnostic Caudal ESI + epidurogram #1  Possible Racz procedure  Diagnostic bilateral lumbar facet MBB  Possible  bilateral lumbar facet RFA  Possible candidate for intrathecal pump trial and implant    Completed:   Diagnostic/therapeutic left caudal ESI + epidurogram x1 (11/17/2021)    Therapeutic  Palliative (PRN) options:   Therapeutic/palliative left L4 TFESI #2  Therapeutic/palliative left L5 TFESI #2  Bilateral spinal cord stimulator trial (done - 01/17/2018)  Permanent bilateral spinal cord stimulator implant by Dr. Cari Caraway (neurosurgery) (done - 03/13/2018)     Recent Visits Date Type Provider Dept  11/17/21 Procedure visit Milinda Pointer, MD [facility]  11/08/21 Office Visit Milinda Pointer, MD [facility]  Showing recent visits within past 90 days and meeting all other requirements Future Appointments Date Type Provider Dept  12/26/21 Appointment Milinda Pointer, [facility]  02/01/22 Appointment Milinda Pointer, MD [facility]  Showing future appointments within next 90 days and meeting all  other requirements  I discussed the assessment and treatment plan with the patient. The patient was provided an opportunity to ask questions and all were answered. The patient agreed with the plan and demonstrated an understanding of the instructions.  Patient advised to call back or seek an in-person evaluation if the symptoms or condition worsens.  Duration of encounter: *** minutes.  Total time on encounter, as per AMA guidelines included both the face-to-face and non-face-to-face time personally spent by the physician and/or other qualified health care professional(s) on the day of the encounter (includes time in activities that require the physician or other qualified health care professional and does not include time in activities normally performed by clinical staff). Physician's time may include the following activities when performed: preparing to see the patient (eg, review of tests, pre-charting review of records) obtaining and/or  reviewing separately obtained history performing a medically appropriate examination and/or evaluation counseling and educating the patient/family/caregiver ordering medications, tests, or procedures referring and communicating with other health care professionals (when not separately reported) documenting clinical information in the electronic or other health record independently interpreting results (not separately reported) and communicating results to the patient/ family/caregiver care coordination (not separately reported)  Note by: Gaspar Cola, MD Date: 12/26/2021; Time: 2:17 PM

## 2021-12-26 ENCOUNTER — Ambulatory Visit (HOSPITAL_BASED_OUTPATIENT_CLINIC_OR_DEPARTMENT_OTHER): Payer: Medicare HMO | Admitting: Pain Medicine

## 2021-12-26 ENCOUNTER — Encounter: Payer: Self-pay | Admitting: Pain Medicine

## 2021-12-26 ENCOUNTER — Ambulatory Visit
Admission: RE | Admit: 2021-12-26 | Discharge: 2021-12-26 | Disposition: A | Discharge status: Home / Self Care | Payer: Medicare HMO | Source: Ambulatory Visit | Attending: Pain Medicine | Admitting: Pain Medicine

## 2021-12-26 VITALS — BP 137/75 | HR 76 | Temp 98.2°F | Ht 72.0 in | Wt 198.0 lb

## 2021-12-26 DIAGNOSIS — M5442 Lumbago with sciatica, left side: Secondary | ICD-10-CM | POA: Insufficient documentation

## 2021-12-26 DIAGNOSIS — M5417 Radiculopathy, lumbosacral region: Secondary | ICD-10-CM | POA: Insufficient documentation

## 2021-12-26 DIAGNOSIS — G4486 Cervicogenic headache: Secondary | ICD-10-CM

## 2021-12-26 DIAGNOSIS — M542 Cervicalgia: Secondary | ICD-10-CM | POA: Insufficient documentation

## 2021-12-26 DIAGNOSIS — Z79899 Other long term (current) drug therapy: Secondary | ICD-10-CM

## 2021-12-26 DIAGNOSIS — Z9682 Presence of neurostimulator: Secondary | ICD-10-CM | POA: Insufficient documentation

## 2021-12-26 DIAGNOSIS — Z79891 Long term (current) use of opiate analgesic: Secondary | ICD-10-CM

## 2021-12-26 DIAGNOSIS — M47812 Spondylosis without myelopathy or radiculopathy, cervical region: Secondary | ICD-10-CM | POA: Insufficient documentation

## 2021-12-26 DIAGNOSIS — M79605 Pain in left leg: Secondary | ICD-10-CM

## 2021-12-26 DIAGNOSIS — M5136 Other intervertebral disc degeneration, lumbar region: Secondary | ICD-10-CM

## 2021-12-26 DIAGNOSIS — M5382 Other specified dorsopathies, cervical region: Secondary | ICD-10-CM

## 2021-12-26 DIAGNOSIS — M961 Postlaminectomy syndrome, not elsewhere classified: Secondary | ICD-10-CM

## 2021-12-26 DIAGNOSIS — M5030 Other cervical disc degeneration, unspecified cervical region: Secondary | ICD-10-CM | POA: Diagnosis not present

## 2021-12-26 DIAGNOSIS — G894 Chronic pain syndrome: Secondary | ICD-10-CM

## 2021-12-26 DIAGNOSIS — G8929 Other chronic pain: Secondary | ICD-10-CM | POA: Insufficient documentation

## 2021-12-26 MED ORDER — OXYCODONE HCL 10 MG PO TABS: 10.0000 mg | ORAL_TABLET | Freq: Four times a day (QID) | ORAL | 0 refills | Status: DC | PRN

## 2021-12-26 NOTE — Progress Notes (Signed)
Nursing Pain Medication Assessment:  Safety precautions to be maintained throughout the outpatient stay will include: orient to surroundings, keep bed in low position, maintain call bell within reach at all times, provide assistance with transfer out of bed and ambulation.  Medication Inspection Compliance: Pill count conducted under aseptic conditions, in front of the patient. Neither the pills nor the bottle was removed from the patient's sight at any time. Once count was completed pills were immediately returned to the patient in their original bottle.  Medication: Oxycodone HCL Pill/Patch Count:  81 of 120 pills remain Pill/Patch Appearance: Markings consistent with prescribed medication Bottle Appearance: Standard pharmacy container. Clearly labeled. Filled Date: 46 / 04 / 2023 Last Medication intake:  Today

## 2021-12-26 NOTE — Progress Notes (Signed)
Safety precautions to be maintained throughout the outpatient stay will include: orient to surroundings, keep bed in low position, maintain call bell within reach at all times, provide assistance with transfer out of bed and ambulation.  

## 2021-12-27 NOTE — Addendum Note (Signed)
Addended by: Milinda Pointer A on: 12/27/2021 10:11 AM   Modules accepted: Orders

## 2021-12-31 NOTE — Progress Notes (Signed)
PROVIDER NOTE: Interpretation of information contained herein should be left to medically-trained personnel. Specific patient instructions are provided elsewhere under "Patient Instructions" section of medical record. This document was created in part using STT-dictation technology, any transcriptional errors that may result from this process are unintentional.  Patient: Chase Mendez Type: Established DOB: 03-29-1934 MRN: [id number] PCP: Jodi Marble, MD  Service: Procedure DOS: 01/05/2022 Setting: Ambulatory Location: Ambulatory outpatient facility Delivery: Face-to-face Provider: Gaspar Cola, MD Specialty: Interventional Pain Management Specialty designation: 09 Location: Outpatient facility Ref. Prov.: Milinda Pointer, MD     Procedure:           Type: Cervical Facet Medial Branch Block(s) #1  Laterality: Bilateral  Level: C3, C4, C5, C6, & C7 Medial Branch Level(s). Injecting these levels blocks the C3-4, C4-5, C5-6, and C6-7 cervical facet joints.  Imaging: Fluoroscopic guidance Anesthesia: Local anesthesia (1-2% Lidocaine) Anxiolysis: IV Versed         Sedation: Moderate Sedation                       DOS: 01/05/2022  Performed by: Gaspar Cola, MD  Purpose: Diagnostic/Therapeutic Indications: Cervicalgia (cervical spine axial pain) severe enough to impact quality of life or function. 1. Cervical facet syndrome   2. Spondylosis without myelopathy or radiculopathy, cervical region   3. Cervicalgia   4. Cervicogenic headache   5. Painful cervical range of motion   6. Impaired range of motion of cervical spine   7. DDD (degenerative disc disease), cervical    NAS-11 Pain score:   Pre-procedure: 4 /10   Post-procedure: 0-No pain/10     Position / Prep / Materials:  Position: Prone. Head in cradle. C-spine slightly flexed. Prep solution: DuraPrep (Iodine Povacrylex [0.7% available iodine] and Isopropyl Alcohol, 74% w/w) Prep Area: Posterior  Cervico-thoracic Region. From occipital ridge to tip of scapula, and from shoulder to shoulder. Entire posterior and lateral neck surface. Materials:  Tray: Block Needle(s):  Type: Spinal  Gauge (G): 22"  Length: 3.5-in  Qty: 5  Pre-op H&P Assessment:  Chase Mendez is a 87 y.o. (year old), male patient, seen today for interventional treatment. He  has a past surgical history that includes Cholecystectomy; Esophagogastroduodenoscopy (egd) with propofol (N/A, 04/24/2017); Back surgery (1995, 1996); Eye surgery (Bilateral, 1983, 1985); Appendectomy; and Spinal cord stimulator insertion (N/A, 03/13/2018). Chase Mendez has a current medication list which includes the following prescription(s): allopurinol, amlodipine, aspirin, atorvastatin, duloxetine, isosorbide mononitrate, levothyroxine, losartan, ondansetron, [START ON 01/10/2022] oxycodone hcl, [START ON 02/09/2022] oxycodone hcl, tamsulosin, testosterone, cyanocobalamin, vitamin e, vraylar, pantoprazole, and [DISCONTINUED] diphenhydramine, and the following Facility-Administered Medications: fentanyl. His primarily concern today is the Neck Pain  Initial Vital Signs:  Pulse/HCG Rate: 72ECG Heart Rate: 75 (NSR) Temp: (!) 97.2 F (36.2 C) Resp: 20 BP: (!) 149/69 SpO2: 96 %  BMI: Estimated body mass index is 25.77 kg/m as calculated from the following:   Height as of this encounter: 6' (1.829 m).   Weight as of this encounter: 190 lb (86.2 kg).  Risk Assessment: Allergies: Reviewed. He is allergic to doxycycline, ibuprofen, and sulfa antibiotics.  Allergy Precautions: None required Coagulopathies: Reviewed. None identified.  Blood-thinner therapy: None at this time Active Infection(s): Reviewed. None identified. Chase Mendez is afebrile  Site Confirmation: Chase Mendez was asked to confirm the procedure and laterality before marking the site Procedure checklist: Completed Consent: Before the procedure and under the influence of no sedative(s),  amnesic(s), or anxiolytics, the patient  was informed of the treatment options, risks and possible complications. To fulfill our ethical and legal obligations, as recommended by the American Medical Association's Code of Ethics, I have informed the patient of my clinical impression; the nature and purpose of the treatment or procedure; the risks, benefits, and possible complications of the intervention; the alternatives, including doing nothing; the risk(s) and benefit(s) of the alternative treatment(s) or procedure(s); and the risk(s) and benefit(s) of doing nothing. The patient was provided information about the general risks and possible complications associated with the procedure. These may include, but are not limited to: failure to achieve desired goals, infection, bleeding, organ or nerve damage, allergic reactions, paralysis, and death. In addition, the patient was informed of those risks and complications associated to Spine-related procedures, such as failure to decrease pain; infection (i.e.: Meningitis, epidural or intraspinal abscess); bleeding (i.e.: epidural hematoma, subarachnoid hemorrhage, or any other type of intraspinal or peri-dural bleeding); organ or nerve damage (i.e.: Any type of peripheral nerve, nerve root, or spinal cord injury) with subsequent damage to sensory, motor, and/or autonomic systems, resulting in permanent pain, numbness, and/or weakness of one or several areas of the body; allergic reactions; (i.e.: anaphylactic reaction); and/or death. Furthermore, the patient was informed of those risks and complications associated with the medications. These include, but are not limited to: allergic reactions (i.e.: anaphylactic or anaphylactoid reaction(s)); adrenal axis suppression; blood sugar elevation that in diabetics may result in ketoacidosis or comma; water retention that in patients with history of congestive heart failure may result in shortness of breath, pulmonary edema, and  decompensation with resultant heart failure; weight gain; swelling or edema; medication-induced neural toxicity; particulate matter embolism and blood vessel occlusion with resultant organ, and/or nervous system infarction; and/or aseptic necrosis of one or more joints. Finally, the patient was informed that Medicine is not an exact science; therefore, there is also the possibility of unforeseen or unpredictable risks and/or possible complications that may result in a catastrophic outcome. The patient indicated having understood very clearly. We have given the patient no guarantees and we have made no promises. Enough time was given to the patient to ask questions, all of which were answered to the patient's satisfaction. Mr. Westrup has indicated that he wanted to continue with the procedure. Attestation: I, the ordering provider, attest that I have discussed with the patient the benefits, risks, side-effects, alternatives, likelihood of achieving goals, and potential problems during recovery for the procedure that I have provided informed consent. Date  Time: 01/05/2022  8:01 AM  Pre-Procedure Preparation:  Monitoring: As per clinic protocol. Respiration, ETCO2, SpO2, BP, heart rate and rhythm monitor placed and checked for adequate function Safety Precautions: Patient was assessed for positional comfort and pressure points before starting the procedure. Time-out: I initiated and conducted the "Time-out" before starting the procedure, as per protocol. The patient was asked to participate by confirming the accuracy of the "Time Out" information. Verification of the correct person, site, and procedure were performed and confirmed by me, the nursing staff, and the patient. "Time-out" conducted as per Joint Commission's Universal Protocol (UP.01.01.01). Time: 0347  Description/Narrative of Procedure:          Laterality: Bilateral. The procedure was performed in identical fashion on both sides. Targeted  Levels:  C3, C4, C5, C6, & C7 Medial Branch Level(s).  Rationale (medical necessity): procedure needed and proper for the diagnosis and/or treatment of the patient's medical symptoms and needs. Procedural Technique Safety Precautions: Aspiration looking for blood return was  conducted prior to all injections. At no point did we inject any substances, as a needle was being advanced. No attempts were made at seeking any paresthesias. Safe injection practices and needle disposal techniques used. Medications properly checked for expiration dates. SDV (single dose vial) medications used. Description of the Procedure: Protocol guidelines were followed. The patient was assisted into a comfortable position. The target area was identified and the area prepped in the usual manner. Skin & deeper tissues infiltrated with local anesthetic. Appropriate amount of time allowed to pass for local anesthetics to take effect. The procedure needles were then advanced to the target area. Proper needle placement secured. Negative aspiration confirmed. Solution injected in intermittent fashion, asking for systemic symptoms every 0.5cc of injectate. The needles were then removed and the area cleansed, making sure to leave some of the prepping solution back to take advantage of its long term bactericidal properties.  Technical description of process:  C3 Medial Branch Nerve Block (MBB): The target area for the C3 dorsal medial articular branch is the lateral concave waist of the articular pillar of C3. Under fluoroscopic guidance, a Quincke needle was inserted until contact was made with os over the postero-lateral aspect of the articular pillar of C3 (target area). After negative aspiration for blood, 0.5 mL of the nerve block solution was injected without difficulty or complication. The needle was removed intact. C4 Medial Branch Nerve Block (MBB): The target area for the C4 dorsal medial articular branch is the lateral concave  waist of the articular pillar of C4. Under fluoroscopic guidance, a Quincke needle was inserted until contact was made with os over the postero-lateral aspect of the articular pillar of C4 (target area). After negative aspiration for blood, 0.5 mL of the nerve block solution was injected without difficulty or complication. The needle was removed intact. C5 Medial Branch Nerve Block (MBB): The target area for the C5 dorsal medial articular branch is the lateral concave waist of the articular pillar of C5. Under fluoroscopic guidance, a Quincke needle was inserted until contact was made with os over the postero-lateral aspect of the articular pillar of C5 (target area). After negative aspiration for blood, 0.5 mL of the nerve block solution was injected without difficulty or complication. The needle was removed intact. C6 Medial Branch Nerve Block (MBB): The target area for the C6 dorsal medial articular branch is the lateral concave waist of the articular pillar of C6. Under fluoroscopic guidance, a Quincke needle was inserted until contact was made with os over the postero-lateral aspect of the articular pillar of C6 (target area). After negative aspiration for blood, 0.5 mL of the nerve block solution was injected without difficulty or complication. The needle was removed intact. C7 Medial Branch Nerve Block (MBB): The target for the C7 dorsal medial articular branch lies on the superior-medial tip of the C7 transverse process. Under fluoroscopic guidance, a Quincke needle was inserted until contact was made with os over the postero-lateral aspect of the articular pillar of C7 (target area). After negative aspiration for blood, 0.5 mL of the nerve block solution was injected without difficulty or complication. The needle was removed intact.  Once the entire procedure was completed, the treated area was cleaned, making sure to leave some of the prepping solution back to take advantage of its long term  bactericidal properties.  Anatomy Reference Guide:       Vitals:   01/05/22 0852 01/05/22 0902 01/05/22 0912 01/05/22 0922  BP:  (!) 145/72 135/71 [name])  141/72  Pulse:  78 72 73  Resp: 18 (!) '21 16 18  '$ Temp:      TempSrc:      SpO2: 100% 96% 97% 96%  Weight:      Height:         Start Time: 0838 hrs. End Time: 0852 hrs.  Imaging Guidance (Spinal):          Type of Imaging Technique: Fluoroscopy Guidance (Spinal) Indication(s): Assistance in needle guidance and placement for procedures requiring needle placement in or near specific anatomical locations not easily accessible without such assistance. Exposure Time: Please see nurses notes. Contrast: None used. Fluoroscopic Guidance: I was personally present during the use of fluoroscopy. "Tunnel Vision Technique" used to obtain the best possible view of the target area. Parallax error corrected before commencing the procedure. "Direction-depth-direction" technique used to introduce the needle under continuous pulsed fluoroscopy. Once target was reached, antero-posterior, oblique, and lateral fluoroscopic projection used confirm needle placement in all planes. Images permanently stored in EMR. Interpretation: No contrast injected. I personally interpreted the imaging intraoperatively. Adequate needle placement confirmed in multiple planes. Permanent images saved into the patient's record.  Post-operative Assessment:  Post-procedure Vital Signs:  Pulse/HCG Rate: 7373 (NSR) Temp: (!) 97.2 F (36.2 C) Resp: 18 BP: (!) 141/72 SpO2: 96 %  EBL: None  Complications: No immediate post-treatment complications observed by team, or reported by patient.  Note: The patient tolerated the entire procedure well. A repeat set of vitals were taken after the procedure and the patient was kept under observation following institutional policy, for this type of procedure. Post-procedural neurological assessment was performed, showing return to  baseline, prior to discharge. The patient was provided with post-procedure discharge instructions, including a section on how to identify potential problems. Should any problems arise concerning this procedure, the patient was given instructions to immediately contact us, at any time, without hesitation. In any case, we plan to contact the patient by telephone for a follow-up status report regarding this interventional procedure.  Comments:  No additional relevant information.  Plan of Care  Orders:  Orders Placed This Encounter  Procedures   CERVICAL FACET (MEDIAL BRANCH NERVE BLOCK)     Scheduling Instructions:     Side: Bilateral     Level: C3-4, C4-5, C5-6 Facet joints (C3, C4, C5, C6, & C7 Medial Branch Nerves)     Sedation: Patient's choice.     Timeframe: Today    Order Specific Question:   Where will this procedure be performed?    Answer:   [facility]   DG [facility] C-ARM 1-60 MIN NO REPORT    Intraoperative interpretation by procedural physician at [place].    Standing Status:   Standing    Number of Occurrences:   1    Order Specific Question:   Reason for exam:    Answer:   Assistance in needle guidance and placement for procedures requiring needle placement in or near specific anatomical locations not easily accessible without such assistance.   Informed Consent Details: Physician/Practitioner Attestation; Transcribe to consent form and obtain patient signature    Note: Always confirm laterality of pain with Mr. Hedglin, before procedure. Transcribe to consent form and obtain patient signature.    Order Specific Question:   Physician/Practitioner attestation of informed consent for procedure/surgical case    Answer:   I, the physician/practitioner, attest that I have discussed with the patient the benefits, risks, side effects, alternatives, likelihood of achieving goals and  potential problems during recovery for the procedure that I have provided  informed consent.    Order Specific Question:   Procedure    Answer:   Bilateral Cervical facet block under fluoroscopic guidance.    Order Specific Question:   Physician/Practitioner performing the procedure    Answer:   Christabella Alvira A. Dossie Arbour, MD    Order Specific Question:   Indication/Reason    Answer:   Chronic neck pain secondary to cervical facet syndrome   Provide equipment / supplies at bedside    "Block Tray" (Disposable  single use) Skin infiltration needle: Regular - 1.5-in, 25-G, (x1) Block Needle type: Spinal Amount/quantity: 5 Size: Regular (3.5-inch) Gauge: 22G    Standing Status:   Standing    Number of Occurrences:   1    Order Specific Question:   Specify    Answer:   Block Tray   Chronic Opioid Analgesic:  Oxycodone IR 10 mg, 1 tab PO q 6 hrs. (40 mg/day of oxycodone) MME/day: 60 mg/day.   Medications ordered for procedure: Meds ordered this encounter  Medications   lidocaine (XYLOCAINE) 2 % (with pres) injection 400 mg   lactated ringers infusion   midazolam (VERSED) 5 MG/5ML injection 0.5-2 mg    Make sure Flumazenil is available in the pyxis when using this medication. If oversedation occurs, administer 0.2 mg IV over 15 sec. If after 45 sec no response, administer 0.2 mg again over 1 min; may repeat at 1 min intervals; not to exceed 4 doses (1 mg)   ropivacaine (PF) 2 mg/mL (0.2%) (NAROPIN) injection 18 mL   dexamethasone (DECADRON) injection 20 mg   fentaNYL (SUBLIMAZE) injection 25-50 mcg    Make sure Narcan is available in the pyxis when using this medication. In the event of respiratory depression (RR< 8/min): Titrate NARCAN (naloxone) in increments of 0.1 to 0.2 mg IV at 2-3 minute intervals, until desired degree of reversal.   Medications administered: We administered lidocaine, lactated ringers, midazolam, ropivacaine (PF) 2 mg/mL (0.2%), dexamethasone, and fentaNYL.  See the medical record for exact dosing, route, and time of  administration.  Follow-up plan:   Return in about 2 weeks (around 01/19/2022) for Proc-day (T,Th), (F2F), (PPE).       Interventional Therapies  Risk  Complexity Considerations:   Estimated body mass index is 26.04 kg/m as calculated from the following:   Height as of this encounter: 6' (1.829 m).   Weight as of this encounter: 192 lb (87.1 kg). WNL   Planned  Pending:   Diagnostic/therapeutic bilateral cervical facet MBB #1    Under consideration:   Diagnostic Caudal ESI + epidurogram #1  Possible Racz procedure  Diagnostic bilateral lumbar facet MBB  Possible bilateral lumbar facet RFA  Possible candidate for intrathecal pump trial and implant    Completed:   Diagnostic/therapeutic left caudal ESI + epidurogram x1 (11/17/2021) (100/100/25/25)    Therapeutic  Palliative (PRN) options:   Therapeutic/palliative left L4 TFESI #2  Therapeutic/palliative left L5 TFESI #2  Bilateral spinal cord stimulator trial (done - 01/17/2018)  Permanent bilateral spinal cord stimulator implant by Dr. Cari Caraway (neurosurgery) (done - 03/13/2018)   Pharmacotherapy:  Nonopioids transferred 01/12/2020: Gabapentin Recommendations:   None at this time.     Recent Visits Date Type Provider Dept  12/26/21 Office Visit Milinda Pointer, MD [facility]  11/17/21 Procedure visit Milinda Pointer, MD [facility]  11/08/21 Office Visit Milinda Pointer, MD [facility]  Showing recent visits within past 68  days and meeting all other requirements Today's Visits Date Type Provider Dept  01/05/22 Procedure visit Milinda Pointer, MD [facility]  Showing today's visits and meeting all other requirements Future Appointments Date Type Provider Dept  01/19/22 Appointment Milinda Pointer, MD [facility]  03/06/22 Appointment Milinda Pointer, MD [facility]  Showing future appointments within next 90 days and meeting all other  requirements  Disposition: Discharge home  Discharge (Date  Time): 01/05/2022; 0924 hrs.   Primary Care Physician: Jodi Marble, MD Location: [facility] Note by: Gaspar Cola, MD Date: 01/05/2022; Time: 10:52 AM  Disclaimer:  Medicine is not an Chief Strategy Officer. The only guarantee in medicine is that nothing is guaranteed. It is important to note that the decision to proceed with this intervention was based on the information collected from the patient. The Data and conclusions were drawn from the patient's questionnaire, the interview, and the physical examination. Because the information was provided in large part by the patient, it cannot be guaranteed that it has not been purposely or unconsciously manipulated. Every effort has been made to obtain as much relevant data as possible for this evaluation. It is important to note that the conclusions that lead to this procedure are derived in large part from the available data. Always take into account that the treatment will also be dependent on availability of resources and existing treatment guidelines, considered by other Pain Management Practitioners as being common knowledge and practice, at the time of the intervention. For Medico-Legal purposes, it is also important to point out that variation in procedural techniques and pharmacological choices are the acceptable norm. The indications, contraindications, technique, and results of the above procedure should only be interpreted and judged by a Board-Certified Interventional Pain Specialist with extensive familiarity and expertise in the same exact procedure and technique.

## 2022-01-04 ENCOUNTER — Telehealth: Payer: Self-pay | Admitting: Pain Medicine

## 2022-01-04 NOTE — Telephone Encounter (Signed)
PT wife stated that patient fall yesterday and didn't go seek any treatment. Wife stated that he has an bump on his forehead. PT has been fine BP has been good and no headaches. Wife just wanted to make sure it's okay for patient to still have procedure done on tomorrow. To let doctor know aware. Thanks

## 2022-01-05 ENCOUNTER — Ambulatory Visit
Admission: RE | Admit: 2022-01-05 | Discharge: 2022-01-05 | Disposition: A | Discharge status: Home / Self Care | Payer: PRIVATE HEALTH INSURANCE | Source: Ambulatory Visit | Attending: Pain Medicine | Admitting: Pain Medicine

## 2022-01-05 ENCOUNTER — Encounter: Payer: Self-pay | Admitting: Pain Medicine

## 2022-01-05 ENCOUNTER — Ambulatory Visit: Payer: Medicare HMO | Attending: Pain Medicine | Admitting: Pain Medicine

## 2022-01-05 VITALS — BP 141/72 | HR 73 | Temp 97.2°F | Resp 18 | Ht 72.0 in | Wt 190.0 lb

## 2022-01-05 DIAGNOSIS — M47812 Spondylosis without myelopathy or radiculopathy, cervical region: Secondary | ICD-10-CM | POA: Insufficient documentation

## 2022-01-05 DIAGNOSIS — M5030 Other cervical disc degeneration, unspecified cervical region: Secondary | ICD-10-CM | POA: Diagnosis present

## 2022-01-05 DIAGNOSIS — M542 Cervicalgia: Secondary | ICD-10-CM | POA: Diagnosis present

## 2022-01-05 DIAGNOSIS — M5382 Other specified dorsopathies, cervical region: Secondary | ICD-10-CM | POA: Insufficient documentation

## 2022-01-05 DIAGNOSIS — G4486 Cervicogenic headache: Secondary | ICD-10-CM | POA: Diagnosis present

## 2022-01-05 MED ORDER — DEXAMETHASONE SODIUM PHOSPHATE 10 MG/ML IJ SOLN
INTRAMUSCULAR | Status: AC
  Filled 2022-01-05: qty 1

## 2022-01-05 MED ORDER — MIDAZOLAM HCL 5 MG/5ML IJ SOLN
0.5000 mg | Freq: Once | INTRAMUSCULAR | Status: AC
  Administered 2022-01-05: 1 mg via INTRAVENOUS

## 2022-01-05 MED ORDER — FENTANYL CITRATE (PF) 100 MCG/2ML IJ SOLN
25.0000 ug | INTRAMUSCULAR | Status: DC | PRN
  Administered 2022-01-05: 50 ug via INTRAVENOUS

## 2022-01-05 MED ORDER — DEXAMETHASONE SODIUM PHOSPHATE 10 MG/ML IJ SOLN
20.0000 mg | Freq: Once | INTRAMUSCULAR | Status: AC
  Administered 2022-01-05: 20 mg

## 2022-01-05 MED ORDER — ROPIVACAINE HCL 2 MG/ML IJ SOLN
18.0000 mL | Freq: Once | INTRAMUSCULAR | Status: AC
  Administered 2022-01-05: 18 mL via PERINEURAL

## 2022-01-05 MED ORDER — LIDOCAINE HCL 2 % IJ SOLN
INTRAMUSCULAR | Status: AC
  Filled 2022-01-05: qty 20

## 2022-01-05 MED ORDER — FENTANYL CITRATE (PF) 100 MCG/2ML IJ SOLN
INTRAMUSCULAR | Status: AC
  Filled 2022-01-05: qty 2

## 2022-01-05 MED ORDER — LACTATED RINGERS IV SOLN: Freq: Once | INTRAVENOUS | Status: AC

## 2022-01-05 MED ORDER — MIDAZOLAM HCL 5 MG/5ML IJ SOLN
INTRAMUSCULAR | Status: AC
  Filled 2022-01-05: qty 5

## 2022-01-05 MED ORDER — LIDOCAINE HCL 2 % IJ SOLN
20.0000 mL | Freq: Once | INTRAMUSCULAR | Status: AC
  Administered 2022-01-05: 400 mg

## 2022-01-05 MED ORDER — ROPIVACAINE HCL 2 MG/ML IJ SOLN
INTRAMUSCULAR | Status: AC
  Filled 2022-01-05: qty 20

## 2022-01-05 NOTE — Telephone Encounter (Signed)
Patient came if for procedure.  No signs or symptoms of distress.  [name] notified of fall.

## 2022-01-05 NOTE — Progress Notes (Signed)
Safety precautions to be maintained throughout the outpatient stay will include: orient to surroundings, keep bed in low position, maintain call bell within reach at all times, provide assistance with transfer out of bed and ambulation.  

## 2022-01-05 NOTE — Patient Instructions (Signed)

## 2022-01-06 ENCOUNTER — Telehealth: Payer: Self-pay

## 2022-01-06 NOTE — Telephone Encounter (Signed)
Post procedure follow up. Patients wife states he is doing good.

## 2022-01-10 ENCOUNTER — Telehealth: Payer: Self-pay | Admitting: Licensed Clinical Social Worker

## 2022-01-10 ENCOUNTER — Ambulatory Visit (INDEPENDENT_AMBULATORY_CARE_PROVIDER_SITE_OTHER): Payer: Worker's Compensation | Admitting: Licensed Clinical Social Worker

## 2022-01-10 DIAGNOSIS — F331 Major depressive disorder, recurrent, moderate: Secondary | ICD-10-CM | POA: Diagnosis not present

## 2022-01-10 DIAGNOSIS — R69 Illness, unspecified: Secondary | ICD-10-CM | POA: Diagnosis not present

## 2022-01-10 NOTE — Telephone Encounter (Signed)
Left a voice message to contact the office.  - when he calls back, please ask him what questions he may have about the medication.  Thanks.

## 2022-01-10 NOTE — Progress Notes (Signed)
Comprehensive Clinical Assessment (CCA) Note  01/10/2022 Chase Mendez [id number]  Pt presented in person at [place] office. Pt and LCSW were present during the visit.    Chief Complaint:  Chief Complaint  Patient presents with   Anxiety   Depression   Visit Diagnosis:  Encounter Diagnosis  Name Primary?   MDD (major depressive disorder), recurrent episode, moderate ([place]) Yes    Pt is a 87 year old caucasian married male who lives with his wife. Pt presents with anxiety, depression and grief/loss. Pt stated that he has had family and friends that have passed away in the last few months and that it difficult to cope with loss.   Pt stated that he has been having difficult with his eye sight. Pt stated that he can not drive. Pt stated that he has bene feeling depressed and feeling anxious for three years.  Pt stated that he sleeps 8 hours a night. Pt sated that his appetite is good and reports no concerns with his appetite.   Pt stated that he hurt his back and he had to have back surgery in 1995. Pt stated that he has trauma as result of his injury. 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 Pt stated that he has external stressors to include finances, family and medical issues. Pt stated that he worries about his family and that he does not have a relationship with his son. Pt stated that he has a relationship with his daughter and that his wife is supportive.   Pt stated that he thinks about his past often and wishes that he would of things differently. Pt stated that he thinks about his ex-wife and how she is the reason he got out of the [employer]. 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 Pt stated that he joined the [employer] when he was 87 years old. Pt stated that he has anxiety about his past and that he misses his  friends that he was in the [employer] with and that he feels that he could of accomplished more. Pt stated that he misses his brother-in-law that passed away last month. Pt stated that he thinks about him often.   Pt stated that he uses nicotine and smokes half a pack of cigarettes a day. 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    Allowed pt to explore thoughts and feelings associated with life situations and external stressors. Encouraged expression of feelings and used empathic listening. Pt was oriented to time, place and situation.   LCSW answered any questions that the pt had about the treatment plan and used motivational interviewing techniques to complete the CCA and treatment plan with the pt. LCSW showed unconditional positive regard and validated the pts thoughts and feelings.    Pt denies SI/HI or A/V hallucinations.Pt was cooperative during visit and was engaged throughout the visit.    Pt stated that he is compliant with his medications and that he felt that he needed something to help him cope. Therapist educated the pt on my role as his therapist and that I did not prescribe medication. Pt expressed that he wanted LCSW to reach out to his psychiatrist for him in regards to a medication questions. LCSW messaged Dr. Modesta Messing addressing pts concerns with his medication question.  CCA Screening, Triage and Referral (STR)  Patient Reported Information How did you hear about [place]? No data recorded Referral name: No data recorded Referral phone number: No data recorded  Whom do you see for routine medical problems? No data recorded Practice/Facility Name: No data recorded Practice/Facility Phone Number: No data recorded Name of Contact: No data recorded Contact Number: No data recorded Contact Fax Number: No data recorded Prescriber Name: No data recorded Prescriber Address (if known): No data recorded  What Is the Reason for Your Visit/Call Today? No data  recorded How Long Has This Been Causing You Problems? No data recorded What Do You Feel Would Help You the Most Today? No data recorded  Have You Recently Been in Any Inpatient Treatment (Hospital/Detox/Crisis Center/28-Day Program)? No data recorded Name/Location of Program/Hospital:No data recorded How Long Were You There? No data recorded When Were You Discharged? No data recorded  Have You Ever Received Services From [facility] Before? Yes  Who Do You See at [facility]? No data recorded  Have You Recently Had Any Thoughts About Hurting Yourself? No  Are You Planning to Commit Suicide/Harm Yourself At This time? No   Have you Recently Had Thoughts About [place]? No  Explanation: No data recorded  Have You Used Any Alcohol or Drugs in the Past 24 Hours? No data recorded How Long Ago Did You Use Drugs or Alcohol? No data recorded What Did You Use and How Much? No data recorded  Do You Currently Have a Therapist/Psychiatrist? Yes  Name of Therapist/Psychiatrist: Dr. Modesta Messing   Have You Been Recently Discharged From Any Office Practice or Programs? No  Explanation of Discharge From Practice/Program: No data recorded    CCA Screening Triage Referral Assessment Type of Contact: Face-to-Face  Is this Initial or Reassessment? No data recorded Date Telepsych consult ordered in [facility]:  No data recorded Time Telepsych consult ordered in [facility]:  No data recorded  Patient Reported Information Reviewed? No data recorded Patient Left Without Being Seen? No data recorded Reason for Not Completing Assessment: No data recorded  Collateral Involvement: No data recorded  Does Patient Have a [place]? No data recorded Name and Contact of Legal Guardian: No data recorded If Minor and Not Living with Parent(s), Who has Custody? No data recorded Is CPS involved or ever been involved? No data recorded Is APS involved or ever been involved? No data  recorded  Patient Determined To Be At Risk for Harm To Self or Others Based on Review of Patient Reported Information or Presenting Complaint? No  Method: No Plan  Availability of Means: No data recorded Intent: No data recorded Notification Required: No data recorded Additional Information for Danger to Others Potential: No data recorded Additional Comments for Danger to Others Potential: No data recorded Are There Guns or Other Weapons in Your Home? No data recorded Types of Guns/Weapons: No data recorded Are These Weapons Safely Secured?                            No data recorded Who Could Verify You Are Able To Have These Secured: No data recorded Do You Have any Outstanding Charges, Pending Court Dates, Parole/Probation? No data recorded Contacted To Inform of Risk of Harm To Self or Others: No data recorded  Location of Assessment: No data recorded  Does Patient Present under Involuntary Commitment? No data recorded IVC Papers Initial File Date: No data recorded  [place] of Residence: No data recorded  Patient Currently Receiving the Following Services: Individual Therapy   Determination of Need: No data recorded  Options For Referral: No data recorded    CCA Biopsychosocial Intake/Chief Complaint:  anxiety, depression, grief/loss  Current Symptoms/Problems: anxiety, depression, loss of family members and friends   Patient Reported Schizophrenia/Schizoaffective Diagnosis in Past: No   Strengths: playing guitar  Preferences: none  Abilities: playing guitar   Type of Services Patient Feels are Needed: therapy   Initial Clinical Notes/Concerns: No data recorded  Mental Health Symptoms Depression:  Difficulty Concentrating; Change in energy/activity; Tearfulness   Duration of Depressive symptoms: Greater than two weeks   Mania:  None   Anxiety:   Tension; Worrying   Psychosis:  None   Duration of Psychotic symptoms: No data recorded  Trauma:   None   Obsessions:  None   Compulsions:  None   Inattention:  Forgetful   Hyperactivity/Impulsivity:  None   Oppositional/Defiant Behaviors:  None   Emotional Irregularity:  None   Other Mood/Personality Symptoms:  No data recorded   Mental Status Exam Appearance and self-care  Stature:  Average   Weight:  Average weight   Clothing:  Neat/clean   Grooming:  Normal   Cosmetic use:  Age appropriate   Posture/gait:  Normal   Motor activity:  Not Remarkable   Sensorium  Attention:  Normal   Concentration:  Normal   Orientation:  Object; Person; Place; Situation; Time   Recall/memory:  Defective in Short-term (pt stated that he does have diffcult with recall at times)   Affect and Mood  Affect:  No data recorded  Mood:  No data recorded  Relating  Eye contact:  Normal   Facial expression:  Responsive   Attitude toward examiner:  Cooperative   Thought and Language  Speech flow: Clear and Coherent   Thought content:  Appropriate to Mood and Circumstances   Preoccupation:  None   Hallucinations:  None   Organization:  No data recorded  [employer] of Knowledge:  Good   Intelligence:  Average   Abstraction:  Normal   Judgement:  Good   Reality Testing:  Adequate   Insight:  Good   Decision Making:  Normal   Social Functioning  Social Maturity:  Responsible   Social Judgement:  Normal   Stress  Stressors:  Family conflict; Grief/losses; Financial   Coping Ability:  Overwhelmed   Skill Deficits:  Self-care   Supports:  Family     Religion:    Leisure/Recreation: Leisure / Recreation Do You Have Hobbies?: Yes Leisure and Hobbies: playing guitar  Exercise/Diet: Exercise/Diet Do You Exercise?: No Have You Gained or Lost A Significant Amount of Weight in the Past Six Months?: No Do You Follow a Special Diet?: No Do You Have Any Trouble Sleeping?: No   CCA Employment/Education Employment/Work  Situation: Employment / Work Nurse, 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.) Did You Receive Any Psychiatric Treatment/Services While in the [employer]?: No  Education: Education Is Patient Currently Attending School?: No Last Grade Completed: 60 (GED as stated by pt) Did You Graduate From [employer]?: No Did You Attend  College?: No Did You Attend Graduate School?: No Did You Have An Individualized Education Program (IIEP): No Did You Have Any Difficulty At School?: No Patient's Education Has Been Impacted by Current Illness: No   CCA Family/Childhood History Family and Relationship History: Family history Marital status: Married Number of Years Married: 57 What types of issues is patient dealing with in the relationship?: pt stated that he has close relationship with his wife Chase Mendez Does patient have children?: Yes How many children?: 3 How is patient's relationship with their children?: pt stated that he has two daughters and one son. Pt stated that he has not spoke to his son in five years.  Childhood History:  Childhood History By whom was/is the patient raised?: Both parents Additional childhood history information: pt stated that he has a close relationship with his mother and father How were you disciplined when you got in trouble as a child/adolescent?: Pt stated that his dad would get the switch Does patient have siblings?: Yes Number of Siblings: 2 Description of patient's current relationship with siblings: two brothers that have passed away. Pt stated that he misses his brothers Did patient suffer any verbal/emotional/physical/sexual abuse as a child?:  No Did patient suffer from severe childhood neglect?: No Has patient ever been sexually abused/assaulted/raped as an adolescent or adult?: No Was the patient ever a victim of a crime or a disaster?: No Witnessed domestic violence?: No Has patient been affected by domestic violence as an adult?: No  Child/Adolescent Assessment:     CCA Substance Use Alcohol/Drug Use: Alcohol / Drug Use History of alcohol / drug use?: Yes Substance #1 Name of Substance 1: alcohol 1 - Age of First Use: 18 1 - Last Use / Amount: Sober for 35 years as reported by pt 1 - Method of Aquiring: legal Substance #2 Name of Substance 2: Nicotine 2 - Age of First Use: 13 2 - Amount (size/oz): variable 2 - Last Use / Amount: pt stated that he smokes half a pack a day 2 - Method of Aquiring: legal                     ASAM's:  Six Dimensions of Multidimensional Assessment  Dimension 1:  Acute Intoxication and/or Withdrawal Potential:      Dimension 2:  Biomedical Conditions and Complications:      Dimension 3:  Emotional, Behavioral, or Cognitive Conditions and Complications:     Dimension 4:  Readiness to Change:     Dimension 5:  Relapse, Continued use, or Continued Problem Potential:     Dimension 6:  Recovery/Living Environment:     ASAM Severity Score:    ASAM Recommended Level of Treatment:     Substance use Disorder (SUD)    Recommendations for Services/Supports/Treatments:    DSM5 Diagnoses: Patient Active Problem List   Diagnosis Date Noted   Spondylosis without myelopathy or radiculopathy, cervical region 01/05/2022    Class: Chronic   Cervicogenic headache 12/26/2021    Class: Chronic   Painful cervical range of motion 12/26/2021    Class: Chronic   Impaired range of motion of cervical spine 12/26/2021    Class: Chronic   Abnormal MRI, thoracic spine (02/12/2018) 11/17/2021   DDD (degenerative disc disease), cervical 11/17/2021   Chronic constipation 05/05/2021    Hepatic steatosis 05/05/2021   Bilateral lower abdominal pain 03/22/2021   Chronic nausea 03/22/2021   Cervicalgia 11/10/2020   Cervical facet syndrome 11/10/2020   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) 05/16/2020   Anxiety 05/16/2020   Diabetes mellitus ([place]) 05/16/2020   Gastroesophageal reflux disease 05/16/2020   Hypothyroidism 05/16/2020   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   Smokes tobacco daily 05/16/2020   Chronic use of opiate for therapeutic purpose 05/16/2020   Disturbance of skin sensation 01/12/2020   Long term prescription opiate use 01/12/2020   Opiate use 38/75/6433   Uncomplicated opioid dependence ([place]) 10/19/2019   Delirium 01/27/2019   Gout of left foot due to renal impairment 12/16/2018   Presence of neurostimulator 10/22/2018   Encounter for interrogation of neurostimulator 10/22/2018   Chronic musculoskeletal pain 09/11/2018   Neurogenic pain 09/11/2018   Claustrophobia 01/23/2018   Thoracic central spinal stenosis 01/22/2018   Cellulitis of right hand 12/06/2017   Chronic pain syndrome 07/11/2017   DDD (degenerative disc disease), lumbar 06/19/2017   Opioid Drug tolerance 06/10/2017   History of acute renal failure (12/08/2014) 06/10/2017   History of encephalopathy (12/08/2014) 06/10/2017   History of sepsis (12/08/2014) 06/10/2017   Lower limb pain, inferior (L5) (Foot Drop) (Left) 05/16/2017   Chronic low back pain (2ry area of Pain) (Bilateral) (ML) (L>R) w/ sciatica (Left) 05/16/2017   Chronic lower extremity pain (1ry area of Pain) (Left) 05/16/2017   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   Foot drop (Left) 05/16/2017   Lumbosacral radiculopathy at L5 (Left) 05/16/2017   Disorder of skeletal system 05/16/2017   Pharmacologic therapy 05/16/2017   Problems influencing health status 05/16/2017   Venous reflux 07/12/2016   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   Varicose veins of both lower extremities with pain 05/09/2016   ARF  (acute renal failure) ([place]) 12/08/2014   Pneumonia 12/07/2014    Patient Centered Plan: Patient is on the following Treatment Plan(s):  Anxiety and Depression   Referrals to Alternative Service(s): Referred to Alternative Service(s):   Place:   Date:   Time:    Referred to Alternative Service(s):   Place:   Date:   Time:    Referred to Alternative Service(s):   Place:   Date:   Time:    Referred to Alternative Service(s):   Place:   Date:   Time:      Collaboration of Care: Pt expressed that he wanted LCSW to reach out to his psychiatrist for him in regards to a medication question. LCSW messaged Dr. Modesta Messing addressing pts concerns with his medication question.   Patient/Guardian was advised Release of Information must be obtained prior to any record release in order to collaborate their care with an outside provider. Patient/Guardian was advised if they have not already done so to contact the registration department to sign all necessary forms in order for [place] to release information regarding their care.   Consent: Patient/Guardian gives verbal consent for treatment and assignment of benefits for services provided during this visit. Patient/Guardian expressed understanding and agreed to proceed.   Lorenda Hatchet

## 2022-01-10 NOTE — Telephone Encounter (Signed)
Pt had question about his medications. Pt stated that he is compliant with medication and stated that he had questions about being prescribed medication to cope. Pt asked me about prescribing medication and had to educate pt on my role as the therapist. Pt wanted me to reach out to his MD.

## 2022-01-19 ENCOUNTER — Ambulatory Visit: Payer: Medicare HMO | Attending: Pain Medicine | Admitting: Pain Medicine

## 2022-01-19 ENCOUNTER — Encounter: Payer: Self-pay | Admitting: Pain Medicine

## 2022-01-19 VITALS — BP 109/65 | HR 68 | Temp 97.2°F | Resp 16 | Ht 71.0 in | Wt 191.0 lb

## 2022-01-19 DIAGNOSIS — G4486 Cervicogenic headache: Secondary | ICD-10-CM | POA: Diagnosis not present

## 2022-01-19 DIAGNOSIS — G894 Chronic pain syndrome: Secondary | ICD-10-CM | POA: Diagnosis not present

## 2022-01-19 DIAGNOSIS — M5382 Other specified dorsopathies, cervical region: Secondary | ICD-10-CM | POA: Diagnosis not present

## 2022-01-19 DIAGNOSIS — M542 Cervicalgia: Secondary | ICD-10-CM | POA: Diagnosis not present

## 2022-01-19 DIAGNOSIS — M47812 Spondylosis without myelopathy or radiculopathy, cervical region: Secondary | ICD-10-CM | POA: Diagnosis not present

## 2022-01-19 NOTE — Patient Instructions (Signed)
____________________________________________________________________________________________  Patient Information update  To: All of our patients.  Re: Name change.  It has been made official that our current name, "[facility]"   will soon be changed to "[facility]".   The purpose of this change is to eliminate any confusion created by the concept of our practice being a "Medication Management Pain Clinic". In the past this has led to the misconception that we treat pain primarily by the use of prescription medications.  Nothing can be farther from the truth.   Understanding PAIN MANAGEMENT: To further understand what our practice does, you first have to understand that "Pain Management" is a subspecialty that requires additional training once a physician has completed their specialty training, which can be in either Anesthesia, Neurology, Psychiatry, or Physical Medicine and Rehabilitation (PMR). Each one of these contributes to the final approach taken by each physician to the management of their patient's pain. To be a "Pain Management Specialist" you must have first completed one of the specialty trainings below.  Anesthesiologists - trained in clinical pharmacology and interventional techniques such as nerve blockade and regional as well as central neuroanatomy. They are trained to block pain before, during, and after surgical interventions.  Neurologists - trained in the diagnosis and pharmacological treatment of complex neurological conditions, such as Multiple Sclerosis, Parkinson's, spinal cord injuries, and other systemic conditions that may be associated with symptoms that may include but are not limited to pain. They tend to rely primarily on the treatment of chronic pain using prescription medications.  Psychiatrist - trained in conditions affecting the psychosocial  wellbeing of patients including but not limited to depression, anxiety, schizophrenia, personality disorders, addiction, and other substance use disorders that may be associated with chronic pain. They tend to rely primarily on the treatment of chronic pain using prescription medications.   Physical Medicine and Rehabilitation (PMR) physicians, also known as physiatrists - trained to treat a wide variety of medical conditions affecting the brain, spinal cord, nerves, bones, joints, ligaments, muscles, and tendons. Their training is primarily aimed at treating patients that have suffered injuries that have caused severe physical impairment. Their training is primarily aimed at the physical therapy and rehabilitation of those patients. They may also work alongside orthopedic surgeons or neurosurgeons using their expertise in assisting surgical patients to recover after their surgeries.  INTERVENTIONAL PAIN MANAGEMENT is sub-subspecialty of Pain Management.  Our physicians are Board-certified in Anesthesia, Pain Management, and Interventional Pain Management.  This meaning that not only have they been trained and Board-certified in their specialty of Anesthesia, and subspecialty of Pain Management, but they have also received further training in the sub-subspecialty of Interventional Pain Management, in order to become Board-certified as INTERVENTIONAL PAIN MANAGEMENT SPECIALIST.    Mission: Our goal is to use our skills in  INTERVENTIONAL PAIN MANAGEMENT as alternatives to the chronic use of prescription opioid medications for the treatment of pain. To make this more clear, we have changed our name to reflect what we do and offer. We will continue to offer medication management assessment and recommendations, but we will not be taking over any patient's medication management.  ____________________________________________________________________________________________      ____________________________________________________________________________________________  National Pain Medication Shortage  [place] is experiencing worsening drug shortages. These have had a negative widespread effect on patient care and treatment. Not expected to improve any time soon. Predicted to last past 2029.   Drug shortage list (generic   names) Oxycodone IR Oxycodone/APAP Oxymorphone IR Hydromorphone Hydrocodone/APAP Morphine  Where is the problem?  Manufacturing and supply level.  Will this shortage affect you?  Only if you take any of the above pain medications.  How? You may be unable to fill your prescription.  Your pharmacist may offer a "partial fill" of your prescription. (Warning: Do not accept partial fills.) Prescriptions partially filled cannot be transferred to another pharmacy. Read our Medication Rules and Regulation. Depending on how much medicine you are dependent on, you may experience withdrawals when unable to get the medication.  Recommendations: Consider ending your dependence on opioid pain medications. Ask your pain specialist to assist you with the process. Consider switching to a medication currently not in shortage, such as Buprenorphine. Talk to your pain specialist about this option. Consider decreasing your pain medication requirements by managing tolerance thru "Drug Holidays". This may help minimize withdrawals, should you run out of medicine. Control your pain thru the use of non-pharmacological interventional therapies.   Your prescriber: Prescribers cannot be blamed for shortages. Medication manufacturing and supply issues cannot be fixed by the prescriber.   NOTE: The prescriber is not responsible for supplying the medication, or solving supply issues. Work with your pharmacist to solve it. The patient is responsible for the decision to take or continue taking the medication and for identifying and securing a legal supply source. By  law, supplying the medication is the job and responsibility of the pharmacy. The prescriber is responsible for the evaluation, monitoring, and prescribing of these medications.   Prescribers will NOT: Re-issue prescriptions that have been partially filled. Re-issue prescriptions already sent to a pharmacy.  Re-send prescriptions to a different pharmacy because yours did not have your medication. Ask pharmacist to order more medicine or transfer the prescription to another pharmacy. (Read below.)  New 2023 regulation: "October 07, 2021 Revised Regulation Allows DEA-Registered Pharmacies to Transfer Electronic Prescriptions at a Patient's Request [place] Patients now have the ability to request their electronic prescription be transferred to another pharmacy without having to go back to their practitioner to initiate the request. This revised regulation went into effect on Monday, October 03, 2021.     At a patient's request, a DEA-registered retail pharmacy can now transfer an electronic prescription for a controlled substance (schedules II-V) to another DEA-registered retail pharmacy. Prior to this change, patients would have to go through their practitioner to cancel their prescription and have it re-issued to a different pharmacy. The process was taxing and time consuming for both patients and practitioners.    The Drug Enforcement Administration [facility]) published its intent to revise the process for transferring electronic prescriptions on December 26, 2019.  The final rule was published in the federal register on September 01, 2021 and went into effect 30 days later.  Under the final rule, a prescription can only be transferred once between pharmacies, and only if allowed under existing state or other applicable law. The prescription must remain in its electronic form; may not be altered in any way; and the transfer must be communicated directly between  two licensed pharmacists. It's important to note, any authorized refills transfer with the original prescription, which means the entire prescription will be filled at the same pharmacy".  Reference: [email or web address] [facility] website announcement)  [email or web address] ([place])   [employer] / Vol. 88, No. 143 / Thursday, September 01, 2021 / Rules and Regulations DEPARTMENT OF JUSTICE  Drug Enforcement  Administration  21 CFR Part 9539  [Docket No. [id number]]  RIN [id number]-AB64 Transfer of Electronic Prescriptions for Schedules II-V Controlled Substances Between Pharmacies for Initial Filling  ____________________________________________________________________________________________

## 2022-01-19 NOTE — Progress Notes (Signed)
Nursing Pain Medication Assessment:  Safety precautions to be maintained throughout the outpatient stay will include: orient to surroundings, keep bed in low position, maintain call bell within reach at all times, provide assistance with transfer out of bed and ambulation.  Medication Inspection Compliance: Pill count conducted under aseptic conditions, in front of the patient. Neither the pills nor the bottle was removed from the patient's sight at any time. Once count was completed pills were immediately returned to the patient in their original bottle.  Medication: Oxycodone IR Pill/Patch Count:  85 of 120 pills remain Pill/Patch Appearance: Markings consistent with prescribed medication Bottle Appearance: Standard pharmacy container. Clearly labeled. Filled Date: 4 / 5 / 2023 Last Medication intake:  Today

## 2022-01-19 NOTE — Progress Notes (Signed)
PROVIDER NOTE: Information contained herein reflects review and annotations entered in association with encounter. Interpretation of such information and data should be left to medically-trained personnel. Information provided to patient can be located elsewhere in the medical record under "Patient Instructions". Document created using STT-dictation technology, any transcriptional errors that may result from process are unintentional.    Patient: Chase Mendez  Service Category: E/M  Provider: Gaspar Cola, MD  DOB: [birth year]  DOS: 01/19/2022  Referring Provider: Jodi Marble, MD  MRN: [id number]  Specialty: Interventional Pain Management  PCP: Jodi Marble, MD  Type: Established Patient  Setting: Ambulatory outpatient    Location: Office  Delivery: Face-to-face     HPI  [name], a 87 y.o. year old male, is here today because of his Cervicalgia [M54.2]. [name] primary complain today is Neck Pain Last encounter: My last encounter with him was on 01/05/2022. Pertinent problems: [name] has Spinal accessory neuropathy; Lower limb pain, inferior (L5) (Foot Drop) (Left); Chronic low back pain (2ry area of Pain) (Bilateral) (ML) (L>R) w/ sciatica (Left); Chronic lower extremity pain (1ry area of Pain) (Left); Failed back surgical syndrome; Foot drop (Left); Lumbosacral radiculopathy at L5 (Left); DDD (degenerative disc disease), lumbar; Chronic pain syndrome; Thoracic central spinal stenosis; Chronic musculoskeletal pain; Neurogenic pain; Presence of neurostimulator; Gout of left foot due to renal impairment; Disturbance of skin sensation; Cervicalgia; Cervical facet syndrome; Bilateral lower abdominal pain; Abnormal MRI, thoracic spine (02/12/2018); DDD (degenerative disc disease), cervical; Cervicogenic headache; Painful cervical range of motion; Impaired range of motion of cervical spine; and Spondylosis without myelopathy or radiculopathy, cervical region on their  pertinent problem list. Pain Assessment: Severity of Chronic pain is reported as a 0-No pain/10. Location: Neck Left/denies any pain today. Onset: More than a month ago. Quality:  . Timing: Intermittent. Modifying factor(s): meds. Vitals:  height is _0  (1.803 m) and weight is 191 lb (86.6 kg). His temperature is 97.2 F (36.2 C) (abnormal). His blood pressure is 109/65 and his pulse is 68. His respiration is 16 and oxygen saturation is 96%.  BMI: Estimated body mass index is 26.64 kg/m as calculated from the following:   Height as of this encounter: _1  (1.803 m).   Weight as of this encounter: 191 lb (86.6 kg).  Reason for encounter: post-procedure evaluation and assessment.  The patient indicates having attained an ongoing 100% relief of his neck pain, cervicogenic headaches, and the referred pain failed upper extremities.  He is very happy with the results.  He indicates that he still hearing a lot of cracking and popping in the neck when he moves that around, but for the time being it is not painful.  I have informed him that we will keep this under observation and once it starts going back, we will like for him to give [place] a call so that we can have it repeated.  Depending on when this happens, we may continue to offer him PRN cervical facet blocks or we will move onto radiofrequency ablation if it turns out that the blocks did not last long.  This plan was shared with the patient who understood and accepted.  Post-procedure evaluation   Type: Cervical Facet Medial Branch Block(s) #1  Laterality: Bilateral  Level: C3, C4, C5, C6, & C7 Medial Branch Level(s). Injecting these levels blocks the C3-4, C4-5, C5-6, and C6-7 cervical facet joints.  Imaging: Fluoroscopic guidance Anesthesia: Local anesthesia (1-2% Lidocaine) Anxiolysis: IV Versed  Sedation: Moderate Sedation                       DOS: 01/05/2022  Performed by: Gaspar Cola, MD  Purpose:  Diagnostic/Therapeutic Indications: Cervicalgia (cervical spine axial pain) severe enough to impact quality of life or function. 1. Cervical facet syndrome   2. Spondylosis without myelopathy or radiculopathy, cervical region   3. Cervicalgia   4. Cervicogenic headache   5. Painful cervical range of motion   6. Impaired range of motion of cervical spine   7. DDD (degenerative disc disease), cervical    NAS-11 Pain score:   Pre-procedure: 4 /10   Post-procedure: 0-No pain/10      Effectiveness:  Initial hour after procedure: 100 %. Subsequent 4-6 hours post-procedure: 100 %. Analgesia past initial 6 hours: 100 %. Ongoing improvement:  Analgesic: The patient indicates currently having an ongoing 100% relief of the neck pain including the cervicogenic headaches and the referred pain down the upper extremities. Function: [name] reports improvement in function ROM: [name] reports improvement in ROM  Pharmacotherapy Assessment  Analgesic: Oxycodone IR 10 mg, 1 tab PO q 6 hrs. (40 mg/day of oxycodone) MME/day: 60 mg/day.   Monitoring: [place] PMP: PDMP reviewed during this encounter.       Pharmacotherapy: No side-effects or adverse reactions reported. Compliance: No problems identified. Effectiveness: Clinically acceptable.  Ignatius Specking, RN  01/19/2022 12:19 PM  Sign when Signing Visit Nursing Pain Medication Assessment:  Safety precautions to be maintained throughout the outpatient stay will include: orient to surroundings, keep bed in low position, maintain call bell within reach at all times, provide assistance with transfer out of bed and ambulation.  Medication Inspection Compliance: Pill count conducted under aseptic conditions, in front of the patient. Neither the pills nor the bottle was removed from the patient's sight at any time. Once count was completed pills were immediately returned to the patient in their original bottle.  Medication: Oxycodone IR Pill/Patch  Count:  85 of 120 pills remain Pill/Patch Appearance: Markings consistent with prescribed medication Bottle Appearance: Standard pharmacy container. Clearly labeled. Filled Date: 106 / 5 / 2023 Last Medication intake:  Today    No results found for: "CBDTHCR" No results found for: "D8THCCBX" No results found for: "D9THCCBX"  UDS:  Summary  Date Value Ref Range Status  08/03/2021 Note  Final    Comment:    ==================================================================== [facility] (MW) ==================================================================== Test                             Result       Flag       Units  Drug Present and Declared for Prescription Verification   Oxycodone                      3770         EXPECTED   ng/mg creat   Oxymorphone                    1689         EXPECTED   ng/mg creat   Noroxycodone                   4295         EXPECTED   ng/mg creat   Noroxymorphone  909          EXPECTED   ng/mg creat    Sources of oxycodone are scheduled prescription medications.    Oxymorphone, noroxycodone, and noroxymorphone are expected    metabolites of oxycodone. Oxymorphone is also available as a    scheduled prescription medication.  ==================================================================== Test                      Result    Flag   Units      Ref Range   Creatinine              148              mg/dL      >=20 ==================================================================== Declared Medications:  The flagging and interpretation on this report are based on the  following declared medications.  Unexpected results may arise from  inaccuracies in the declared medications.   **Note: The testing scope of this panel includes these medications:   Oxycodone   **Note: The testing scope of this panel does not include the  following reported medications:   Allopurinol (Zyloprim)  Amlodipine (Norvasc)  Aspirin   Atorvastatin (Lipitor)  Cyanocobalamin  Diphenhydramine (Benadryl)  Duloxetine (Cymbalta)  Gabapentin (Neurontin)  Isosorbide (Imdur)  Levothyroxine (Synthroid)  Losartan (Cozaar)  Ondansetron (Zofran)  Pantoprazole (Protonix)  Tamsulosin (Flomax)  Testosterone  Vitamin E ==================================================================== For clinical consultation, please call [phone]. ====================================================================       ROS  Constitutional: Denies any fever or chills Gastrointestinal: No reported hemesis, hematochezia, vomiting, or acute GI distress Musculoskeletal: Denies any acute onset joint swelling, redness, loss of ROM, or weakness Neurological: No reported episodes of acute onset apraxia, aphasia, dysarthria, agnosia, amnesia, paralysis, loss of coordination, or loss of consciousness  Medication Review  DULoxetine, Oxycodone HCl, Testosterone, allopurinol, amLODipine, aspirin, atorvastatin, cariprazine, cyanocobalamin, diphenhydrAMINE, isosorbide mononitrate, levothyroxine, losartan, ondansetron, pantoprazole, tamsulosin, and vitamin E  History Review  Allergy: [name] is allergic to doxycycline, ibuprofen, and sulfa antibiotics. Drug: [name]  reports current drug use. Drug: Oxycodone. Alcohol:  reports no history of alcohol use. Tobacco:  reports that he has been smoking cigars and cigarettes. He has a 35.00 pack-year smoking history. He has never used smokeless tobacco. Social: [name]  reports that he has been smoking cigars and cigarettes. He has a 35.00 pack-year smoking history. He has never used smokeless tobacco. He reports current drug use. Drug: Oxycodone. He reports that he does not drink alcohol. Medical:  has a past medical history of Acute encephalopathy (12/08/2014), Anxiety, ARF (acute renal failure) ([place]) (12/08/2014), Back pain, Benign neoplasm of large bowel, Capsulitis, Chronic back pain, Coronavirus  infection, Depression, Diabetes mellitus without complication ([place]), Dysphagia, Exostosis, Foot drop, left, Frequent falls (02/2018), GERD (gastroesophageal reflux disease), Gout, Hypertension, Hypothyroidism, Insomnia, Low testosterone, Microscopic hematuria (2016), Myocardial infarction ([place]), Pneumonia (12/07/2014), Pressure ulcer (12/09/2014), Sepsis ([place]) (12/08/2014), and Thyroid disease. Surgical: [name]  has a past surgical history that includes Cholecystectomy; Esophagogastroduodenoscopy (egd) with propofol (N/A, 04/24/2017); Back surgery (1995, 1996); Eye surgery (Bilateral, 1983, 1985); Appendectomy; and Spinal cord stimulator insertion (N/A, 03/13/2018). Family: family history includes Diabetes in his father; Heart disease in his mother; Kidney cancer in his brother.  Laboratory Chemistry Profile   Renal Lab Results  Component Value Date   BUN 17 01/28/2019   CREATININE 1.30 (H) 12/29/2019   GFRAA >60 01/28/2019   GFRNONAA >60 01/28/2019    Hepatic Lab Results  Component Value Date   AST  75 (H) 11/28/2018   ALT 20 11/28/2018   ALBUMIN 3.9 11/28/2018   ALKPHOS 134 (H) 11/28/2018   HCVAB <0.1 07/24/2018   LIPASE 37 08/21/2016    Electrolytes Lab Results  Component Value Date   NA 140 01/28/2019   K 4.0 01/28/2019   CL 105 01/28/2019   CALCIUM 8.9 01/28/2019    Bone No results found for: "VD25OH", "VD125OH2TOT", "CN4709GG8", "ZM6294TM5", "25OHVITD1", "25OHVITD2", "46TKPTWS5", "TESTOFREE", "TESTOSTERONE"  Inflammation (CRP: Acute Phase) (ESR: Chronic Phase) Lab Results  Component Value Date   LATICACIDVEN 1.1 12/08/2014         Note: Above Lab results reviewed.  Recent Imaging Review  DG PAIN CLINIC C-ARM 1-60 MIN NO REPORT Fluoro was used, but no Radiologist interpretation will be provided.  Please refer to "NOTES" tab for provider progress note. Note: Reviewed        Physical Exam  General appearance: Well nourished, well developed, and well hydrated. In no  apparent acute distress Mental status: Alert, oriented x 3 (person, place, & time)       Respiratory: No evidence of acute respiratory distress Eyes: PERLA Vitals: BP 109/65   Pulse 68   Temp (!) 97.2 F (36.2 C)   Resp 16   Ht _0  (1.803 m)   Wt 191 lb (86.6 kg)   SpO2 96%   BMI 26.64 kg/m  BMI: Estimated body mass index is 26.64 kg/m as calculated from the following:   Height as of this encounter: _1  (1.803 m).   Weight as of this encounter: 191 lb (86.6 kg). Ideal: Ideal body weight: 75.3 kg (166 lb 0.1 oz) Adjusted ideal body weight: 79.8 kg (176 lb 0.1 oz)  Assessment   Diagnosis Status  1. Cervicalgia   2. Cervical facet syndrome   3. Cervicogenic headache   4. Painful cervical range of motion   5. Impaired range of motion of cervical spine   6. Chronic pain syndrome    Controlled Controlled Controlled   Updated Problems: No problems updated.  Plan of Care  Problem-specific:  No problem-specific Assessment & Plan notes found for this encounter.  [name] has a current medication list which includes the following long-term medication(s): allopurinol, amlodipine, isosorbide mononitrate, levothyroxine, losartan, oxycodone hcl, [START ON 02/09/2022] oxycodone hcl, testosterone, pantoprazole, and [DISCONTINUED] diphenhydramine.  Pharmacotherapy (Medications Ordered): No orders of the defined types were placed in this encounter.  Orders:  No orders of the defined types were placed in this encounter.  Follow-up plan:   Return for scheduled encounter.     Interventional Therapies  Risk  Complexity Considerations:   Estimated body mass index is 26.04 kg/m as calculated from the following:   Height as of this encounter: 6' (1.829 m).   Weight as of this encounter: 192 lb (87.1 kg). WNL   Planned  Pending:      Under consideration:   Diagnostic Caudal ESI + epidurogram #1  Possible Racz procedure  Diagnostic bilateral lumbar facet MBB   Possible bilateral lumbar facet RFA  Possible candidate for intrathecal pump trial and implant    Completed:   Diagnostic/therapeutic bilateral cervical facet MBB x1 (01/05/2022) (100/100/100/100)  Diagnostic/therapeutic left caudal ESI + epidurogram x1 (11/17/2021) (100/100/25/25)    Therapeutic  Palliative (PRN) options:   Therapeutic/palliative left L4 TFESI #2  Therapeutic/palliative left L5 TFESI #2  Bilateral spinal cord stimulator trial (done - 01/17/2018)  Permanent bilateral spinal cord stimulator implant by Dr. Cari Caraway (neurosurgery) (done - 03/13/2018)  Pharmacotherapy:  Nonopioids transferred 01/12/2020: Gabapentin Recommendations:   None at this time.      Recent Visits Date Type Provider Dept  01/05/22 Procedure visit Milinda Pointer, MD [facility]  12/26/21 Office Visit Milinda Pointer, MD [facility]  11/17/21 Procedure visit Milinda Pointer, MD [facility]  11/08/21 Office Visit Milinda Pointer, MD [facility]  Showing recent visits within past 90 days and meeting all other requirements Today's Visits Date Type Provider Dept  01/19/22 Office Visit Milinda Pointer, MD [facility]  Showing today's visits and meeting all other requirements Future Appointments Date Type Provider Dept  03/06/22 Appointment Milinda Pointer, MD [facility]  Showing future appointments within next 90 days and meeting all other requirements  I discussed the assessment and treatment plan with the patient. The patient was provided an opportunity to ask questions and all were answered. The patient agreed with the plan and demonstrated an understanding of the instructions.  Patient advised to call back or seek an in-person evaluation if the symptoms or condition worsens.  Duration of encounter: 30 minutes.  Total time on encounter, as per AMA guidelines included both the face-to-face and non-face-to-face  time personally spent by the physician and/or other qualified health care professional(s) on the day of the encounter (includes time in activities that require the physician or other qualified health care professional and does not include time in activities normally performed by clinical staff). Physician's time may include the following activities when performed: preparing to see the patient (eg, review of tests, pre-charting review of records) obtaining and/or reviewing separately obtained history performing a medically appropriate examination and/or evaluation counseling and educating the patient/family/caregiver ordering medications, tests, or procedures referring and communicating with other health care professionals (when not separately reported) documenting clinical information in the electronic or other health record independently interpreting results (not separately reported) and communicating results to the patient/ family/caregiver care coordination (not separately reported)  Note by: Gaspar Cola, MD Date: 01/19/2022; Time: 12:19 PM

## 2022-01-24 ENCOUNTER — Ambulatory Visit (INDEPENDENT_AMBULATORY_CARE_PROVIDER_SITE_OTHER): Payer: Worker's Compensation | Admitting: Licensed Clinical Social Worker

## 2022-01-24 DIAGNOSIS — F331 Major depressive disorder, recurrent, moderate: Secondary | ICD-10-CM

## 2022-01-24 DIAGNOSIS — R69 Illness, unspecified: Secondary | ICD-10-CM | POA: Diagnosis not present

## 2022-01-24 NOTE — Progress Notes (Signed)
THERAPIST PROGRESS NOTE  Session Time: 9am-10am   Participation Level: Active  Behavioral Response: NeatAlertEuthymic  Type of Therapy: Individual Therapy  Treatment Goals addressed:  Depression: Reduce overall frequency, intensity and duration of depression so that daily functioning is not impaired per pt self report 3 out of 5 sessions documented.     ProgressTowards Goals: Progressing  Interventions: CBT and Supportive  Pt presented in person at [facility] office. Pt and LCSW were present during the visit.    Summary: Chase Mendez is a 87 y.o. male who presents with continuing symptoms of anxiety and depression. Pt stated that she has been feeling depressed and that he has been thinking a lot about the things that he is not able to do anymore to include golfing, and driving. Pt stated that he gets upset when he thinks about the past and what he is not able to do for himself now.   Pt was able to explore in session things that bring him joy and was able to explore coping skills that he uses. Pt stated that he enjoys watching tv in his [place] and that he he enjoys listening to music. Pt stated that his eyesight is bad and that sometimes it is difficult for him to watch tv.   Allowed pt to explore thoughts and feelings associated with life situations and external stressors. Encouraged expression of feelings and used empathic listening. Pt was oriented to time, place and situation. LCSW validated the pts feelings and thoughts and showed unconditional positive regard.   Pts wife was present in the end of the visit as requested by pt and she expressed that she does not mind helping her husband. Pt stated that he was going to try and do something new and different to help with his depression. Explored in sessions ways that the pt could cope with depression. Pt stated that he was going to try and think of things that he could do to help with feelings of  depression and that he would think about the positive moments and memories he has of the things that he once enjoyed.   Pt was able to explore how his thoughts impact how he feels. Pt stated that he finds himself often thinking about his past and what he wanted to do then and what he can not do now. Pt stated that he wife is supportive and that he enjoys her help and companionship.   Pt denies SI/HI or A/V hallucinations.Pt was cooperative during visit and was engaged throughout the visit. Pt does not report any other concerns at the time of visit.   Suicidal/Homicidal: Nowithout intent/plan  Therapist Response: Educated on the importance of healthy coping skills with the pt. Explored the benefits of healthy coping skills and engaged the pt to discuss ways that they are coping with depression. Encouraged the pt to explore new ways to help alleviate depression and determined new ways to help with distraction and coping in session. Dicussed the benefits of exercise as a way of coping with anxiety and stress. Explored different ways that the pt could cope to include listening to music, being in nature, trying a new hobby and other activities that the pt could try to help with coping.     Encouraged the pt to explore new activities that he can do and to try a new hobby to help alleviate depression symptoms before the next therapy visit. Pt expressed that he would.   LCSW used CBT techniques  to explore how thoughts,feelings and beliefs impact behavorial responses and physical symptoms. Explored how stressors can lead to negative thoughts that can be irrational or exaggerated which in return impacts how the person feels and can negatively impact how their body responds to include fatigue, sleep problems, poor concentration and loss of motivation. Explored how a behavioral response can create new stressors. Engaged with the pt to explore how their thoughts and feelings impact their behaviors and choices.     Encouraged pt to take medications as prescribed by their psychiatrist.    Continued Recommendations as followed: Self-care behaviors, positive social engagements, focusing on positive physical and emotional wellness, and focusing on life/work balance.    Plan: Return again on 02/17/2022  Diagnosis: MDD (major depressive disorder), recurrent episode, moderate ([place])  Collaboration of Care: none   Patient/Guardian was advised Release of Information must be obtained prior to any record release in order to collaborate their care with an outside provider. Patient/Guardian was advised if they have not already done so to contact the registration department to sign all necessary forms in order for [place] to release information regarding their care.   Consent: Patient/Guardian gives verbal consent for treatment and assignment of benefits for services provided during this visit. Patient/Guardian expressed understanding and agreed to proceed.   Lorenda Hatchet 01/24/2022

## 2022-01-25 DIAGNOSIS — N3281 Overactive bladder: Secondary | ICD-10-CM | POA: Diagnosis not present

## 2022-01-25 DIAGNOSIS — N5201 Erectile dysfunction due to arterial insufficiency: Secondary | ICD-10-CM | POA: Diagnosis not present

## 2022-01-25 DIAGNOSIS — E291 Testicular hypofunction: Secondary | ICD-10-CM | POA: Diagnosis not present

## 2022-01-25 DIAGNOSIS — N401 Enlarged prostate with lower urinary tract symptoms: Secondary | ICD-10-CM | POA: Diagnosis not present

## 2022-02-01 ENCOUNTER — Ambulatory Visit: Payer: PRIVATE HEALTH INSURANCE | Admitting: Pain Medicine

## 2022-02-04 DIAGNOSIS — E785 Hyperlipidemia, unspecified: Secondary | ICD-10-CM | POA: Diagnosis not present

## 2022-02-04 DIAGNOSIS — I10 Essential (primary) hypertension: Secondary | ICD-10-CM | POA: Diagnosis not present

## 2022-02-07 DIAGNOSIS — E039 Hypothyroidism, unspecified: Secondary | ICD-10-CM | POA: Diagnosis not present

## 2022-02-07 DIAGNOSIS — R7303 Prediabetes: Secondary | ICD-10-CM | POA: Diagnosis not present

## 2022-02-07 DIAGNOSIS — I10 Essential (primary) hypertension: Secondary | ICD-10-CM | POA: Diagnosis not present

## 2022-02-07 DIAGNOSIS — E559 Vitamin D deficiency, unspecified: Secondary | ICD-10-CM | POA: Diagnosis not present

## 2022-02-07 DIAGNOSIS — D696 Thrombocytopenia, unspecified: Secondary | ICD-10-CM | POA: Diagnosis not present

## 2022-02-10 DIAGNOSIS — I10 Essential (primary) hypertension: Secondary | ICD-10-CM | POA: Diagnosis not present

## 2022-02-10 DIAGNOSIS — I2510 Atherosclerotic heart disease of native coronary artery without angina pectoris: Secondary | ICD-10-CM | POA: Diagnosis not present

## 2022-02-10 DIAGNOSIS — J329 Chronic sinusitis, unspecified: Secondary | ICD-10-CM | POA: Diagnosis not present

## 2022-02-10 DIAGNOSIS — E785 Hyperlipidemia, unspecified: Secondary | ICD-10-CM | POA: Diagnosis not present

## 2022-02-10 DIAGNOSIS — E039 Hypothyroidism, unspecified: Secondary | ICD-10-CM | POA: Diagnosis not present

## 2022-02-10 DIAGNOSIS — E559 Vitamin D deficiency, unspecified: Secondary | ICD-10-CM | POA: Diagnosis not present

## 2022-02-10 DIAGNOSIS — K2100 Gastro-esophageal reflux disease with esophagitis, without bleeding: Secondary | ICD-10-CM | POA: Diagnosis not present

## 2022-02-10 DIAGNOSIS — R7303 Prediabetes: Secondary | ICD-10-CM | POA: Diagnosis not present

## 2022-02-10 DIAGNOSIS — R69 Illness, unspecified: Secondary | ICD-10-CM | POA: Diagnosis not present

## 2022-02-10 DIAGNOSIS — R0602 Shortness of breath: Secondary | ICD-10-CM | POA: Diagnosis not present

## 2022-02-12 NOTE — Progress Notes (Unsigned)
[place] MD/PA/NP OP Progress Note  02/14/2022 1:10 PM Chase Mendez  MRN:  [id number]  Chief Complaint:  Chief Complaint  Patient presents with   Follow-up   HPI:  This is a follow-up appointment for depression and cognitive deficits.  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  He states that he had a good Christmas.  He spent time with his wife, and his daughter.  He also saw her nephew and the child.  He states that there was a family gathering at the community center.  However, they had a loss of brother-in-law, and sister-in-law. He states that it was "just dragging out" in [place]. He states that he did not go to church last Sunday.  He asks if he can be all Vraylar as it costs 1300 per month.  He states that he has issues with his eye, and is planning to see ophthalmologist to get second opinion.      02/14/2022    1:01 PM  Montreal Cognitive Assessment Blind  Attention: Read list of digits (0/2) 0  Attention: Read list of letters (0/1) 0  Attention: Serial 7 subtraction starting at 100 (0/3) 1  Language: Repeat phrase (0/2) 1  Language : Fluency (0/1) 0  Abstraction (0/2) 0  Delayed Recall (0/5) 0  Orientation (0/6) 3  Total 5  Visuospatial- he initially wrote "bed" instead of copying bed Clock drawing- writing "1-8" inside the circle, and "12" outside of circle when he was asked to draw a clock) Orientation- "2024, Feb, 11th, Tues,office, [place]"  Language (he stated "I can't see it well" when he was asked to name words) Attention- he perseverated on list of words he was given for memory despite being redirected to work on another questions.  Chase Mendez, his wife was invited to the interview latter part of the visit. She states that he is "out there somewhere."  He sits and stares, watching TV. He is not talkative as much.  She thinks he has been feeling depressed.  He has been eating better.  She denies any safety concern.  She is not aware of any current/past  hallucinations or paranoia.  She is also concerned about Vraylar due to its cost, although they will get reimbursement. Chase Mendez agrees with the plans as below.   Wt Readings from Last 3 Encounters:  02/14/22 207 lb 9.6 oz (94.2 kg)  01/19/22 191 lb (86.6 kg)  01/05/22 190 lb (86.2 kg)      Functional Status Instrumental Activities of Daily Living (IADLs):  Chase Mendez is independent in the following:  Requires assistance with the following: driving, medications (his wife arranges in pill box), managing finances,   Activities of Daily Living (ADLs):  Chase Mendez is independent in the following: bathing (uses shower chair), and hygiene, feeding, continence, grooming and toileting, walking (walker)     Household: wife Marital status: married for 32 years, second marriage Number of children: 2 (daughter, estranged relationship with his son) 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 MDD (major depressive disorder), recurrent episode, moderate (HCC)  F33.1 Vitamin B12    TSH    Folate    CBC    Comprehensive metabolic panel    2. Neurocognitive disorder  R41.9       Past Psychiatric History: Please see initial evaluation for full details. I have reviewed the history.  No updates at this time.     Past Medical History:  Past Medical History:  Diagnosis Date   Acute encephalopathy 12/08/2014   Anxiety    ARF (acute renal failure) ([place]) 12/08/2014   Back pain    Benign neoplasm of large bowel    Capsulitis    fractured displaced metatarsal with capsulitis   Chronic back pain    Coronavirus infection    Depression    Diabetes mellitus without complication (HCC)    no medications currently   Dysphagia    Exostosis    painful, right hallux   Foot drop, left    wears a brace   Frequent falls 02/2018   poor balance, foot drop   GERD (gastroesophageal  reflux disease)    Gout    Hypertension    Hypothyroidism    Insomnia    Low testosterone    Microscopic hematuria 2016   Myocardial infarction [facility])    patient unaware when it happened years ago.     Pneumonia 12/07/2014   Pressure ulcer 12/09/2014   Sepsis ([place]) 12/08/2014   Thyroid disease     Past Surgical History:  Procedure Laterality Date   [place]   x 2   CHOLECYSTECTOMY     ESOPHAGOGASTRODUODENOSCOPY (EGD) WITH PROPOFOL N/A 04/24/2017   Procedure: ESOPHAGOGASTRODUODENOSCOPY (EGD) WITH PROPOFOL;  Surgeon: Manya Silvas, MD;  Location: [facility] ENDOSCOPY;  Service: Endoscopy;  Laterality: N/A;   EYE SURGERY Bilateral 1983, 1985   cataract extractions   SPINAL CORD STIMULATOR INSERTION N/A 03/13/2018   Procedure: SPINAL CORD STIMULATOR INSERTION;  Surgeon: Meade Maw, MD;  Location: [facility] ORS;  Service: Neurosurgery;  Laterality: N/A;    Family Psychiatric History: Please see initial evaluation for full details. I have reviewed the history. No updates at this time.     Family History:  Family History  Problem Relation Age of Onset   Heart disease Mother    Diabetes Father    Kidney cancer Brother     Social History:  Social History   Socioeconomic History   Marital status: Married    Spouse name: Chase Mendez   Number of children: 2   Years of education: Not on file   Highest education level: Some college, no degree  Occupational History   Occupation: maintenance / repair    Comment: disabled  Tobacco Use   Smoking status: Every Day    Packs/day: 0.50    Years: 70.00    Total pack years: 35.00    Types: Cigars, Cigarettes   Smokeless tobacco: Never   Tobacco comments:    unable to give cessation materials due to webex visit   Vaping Use   Vaping Use: Never used  Substance and Sexual Activity   Alcohol use: No   Drug use: Yes    Types: Oxycodone   Sexual activity: Not Currently  Other Topics Concern   Not on file   Social History Narrative   Not on file   Social Determinants of Health   Financial Resource Strain: Not on file  Food Insecurity: Not on file  Transportation Needs: Not on file  Physical Activity: Not on file  Stress: Not on file  Social Connections: Not on file    Allergies:  Allergies  Allergen Reactions   Doxycycline    Ibuprofen Nausea Only   Sulfa Antibiotics Nausea Only    Metabolic Disorder Labs: Lab Results  Component Value Date  HGBA1C 6.3 (H) 12/08/2014   No results found for: "PROLACTIN" Lab Results  Component Value Date   CHOL 121 12/08/2014   TRIG 115 12/08/2014   HDL 40 (L) 12/08/2014   CHOLHDL 3.0 12/08/2014   VLDL 23 12/08/2014   LDLCALC 58 12/08/2014   Lab Results  Component Value Date   TSH 1.425 01/27/2019    Therapeutic Level Labs: No results found for: "LITHIUM" No results found for: "VALPROATE" No results found for: "CBMZ"  Current Medications: Current Outpatient Medications  Medication Sig Dispense Refill   allopurinol (ZYLOPRIM) 100 MG tablet Take 100 mg by mouth daily.  0   amLODipine (NORVASC) 10 MG tablet Take 10 mg by mouth daily.     aspirin 81 MG tablet Take 81 mg by mouth daily.     atorvastatin (LIPITOR) 10 MG tablet Take 10 mg by mouth at bedtime.     DULoxetine (CYMBALTA) 20 MG capsule Take 2 capsules (40 mg total) by mouth at bedtime. 60 capsule 1   fluticasone (FLONASE) 50 MCG/ACT nasal spray Place into both nostrils.     gabapentin (NEURONTIN) 600 MG tablet Take by mouth.     isosorbide mononitrate (IMDUR) 30 MG 24 hr tablet Take 30 mg by mouth daily.     levothyroxine (SYNTHROID, LEVOTHROID) 125 MCG tablet Take 125 mcg by mouth daily before breakfast.  0   losartan (COZAAR) 100 MG tablet Take 100 mg by mouth daily.      ondansetron (ZOFRAN ODT) 4 MG disintegrating tablet Take 1 tablet (4 mg total) by mouth every 8 (eight) hours as needed for nausea or vomiting. 20 tablet 0   Oxycodone HCl 10 MG TABS Take 1 tablet (10  mg total) by mouth every 6 (six) hours as needed. Must last 30 days. 120 tablet 0   tamsulosin (FLOMAX) 0.4 MG CAPS capsule Take 0.4 mg by mouth daily.     Testosterone 40.5 MG/2.5GM (1.62%) GEL Place 2 Pump onto the skin 3 (three) times a week.     vitamin B-12 (CYANOCOBALAMIN) 1000 MCG tablet Take 1,000 mcg by mouth daily.     vitamin E 400 UNIT capsule Take 400 Units by mouth daily.     VRAYLAR 1.5 MG capsule Take 1.5 mg by mouth.     Oxycodone HCl 10 MG TABS Take 1 tablet (10 mg total) by mouth every 6 (six) hours as needed. Must last 30 days. 120 tablet 0   pantoprazole (PROTONIX) 40 MG tablet Take 40 mg by mouth daily.      No current facility-administered medications for this visit.     Musculoskeletal: Strength & Muscle Tone: within normal limits Gait & Station: normal Patient leans: N/A  Psychiatric Specialty Exam: Review of Systems  Psychiatric/Behavioral:  Positive for decreased concentration, dysphoric mood and sleep disturbance. Negative for agitation, behavioral problems, confusion, hallucinations, self-injury and suicidal ideas. The patient is nervous/anxious. The patient is not hyperactive.   All other systems reviewed and are negative.   Blood pressure 118/72, pulse 72, height '5\' 11"'$  (1.803 m), weight 207 lb 9.6 oz (94.2 kg), SpO2 96 %.Body mass index is 28.95 kg/m.  General Appearance: Fairly Groomed  Eye Contact:  Good  Speech:  Clear and Coherent, slightly increased in latency  Volume:  Normal  Mood:  Depressed  Affect:  Appropriate, Congruent, and Restricted  Thought Process:  Coherent, occasional disorganization  Orientation:  Other:  place, person  Thought Content: Logical   Suicidal Thoughts:  No  Homicidal Thoughts:  No  Memory:  Immediate;   Good  Judgement:  Good  Insight:  Good  Psychomotor Activity:  Normal  Concentration:  Concentration: Good and Attention Span: Good  Recall:  Good  Fund of Knowledge: Good  Language: Good  Akathisia:  No   Handed:  Right  AIMS (if indicated): not done  Assets:  Communication Skills Desire for Improvement  ADL's:  Intact  Cognition: WNL  Sleep:  Fair   Screenings: GAD-7    Flowsheet Row Office Visit from 02/14/2022 in [place] from 01/24/2022 in [place] Visit from 01/10/2022 in [place] Office Visit from 12/12/2021 in [place]  Total GAD-7 Score '12 9 16 10      '$ PHQ2-9    [place] Office Visit from 02/14/2022 in [place] Counselor from 01/24/2022 in [place] Office Visit from 01/10/2022 in [place] Office Visit from 12/26/2021 in [place] Office Visit from 12/12/2021 in [place]  PHQ-2 Total Score '4 5 5 '$ 0 6  PHQ-9 Total Score '12 13 18 '$ -- 14      Flowsheet Row Counselor from 01/24/2022 in [place] Office Visit from 01/10/2022 in [place] Office Visit from 12/12/2021 in [place] No Risk Error: Question 6 not populated No Risk        Assessment and Plan:  Chase Mendez is a 88 y.o. year old male with a history of chronic low back pain, diabetes, GERD, hypertension, hypothyroidism, MI, who presents for follow up appointment for below.    1. MDD (major depressive disorder), recurrent episode, moderate ([place]) He continues to report depressive symptoms since the last visit.  Psychosocial stressors includes demoralization due to declining in health, which includes hearing loss, using a walker.  He also previously reported grief of loss of his family members, and team members in the [employer].  Will uptitrate duloxetine to optimize treatment for depression.  Will  discontinue Vraylar at this time due to financial strain/limited benefit from this medication.   2. Neurocognitive disorder Exam is notable for delayed speech latency, and occasional disorganization, perseveration during MoCA.  He does have cognitive deficits as evidenced by MoCA. Noted that although the score was impacted by decreased vision and hearing loss and with limited effort, he did demonstrate deficits in some areas (such as writing "1-8" inside the circle, and "12" outside of circle when he was asked to draw a clock).  He does have impairment in IADL. Will obtain blood test to rule out medical health condition contributing to these symptoms.  His wife denies safety concern at this time, and verbalized understanding of the nature of possible dementia.    Plan  Increase duloxetine 40 mg daily  Discontinue Vraylar (was on 1.5 mg at least for a few months, EKG in 2020: Qtc 490 msec,  Wide QRS, RBBB, bifascicular block,) Next appointment: 2/20 at 11 am for 30 mins, IP  Past trials of medication: risperidone    The patient demonstrates the following risk factors for suicide: Chronic risk factors for suicide include: psychiatric disorder of depression . Acute risk factors for suicide include: unemployment and loss (financial, interpersonal, professional). Protective factors for this patient include: positive social support and hope for the future. Considering these factors, the overall suicide risk at this point appears to be low.  Patient is appropriate for outpatient follow up.   The duration of the time spent on the following activities on the date of the encounter was 45 minutes.   Preparing to see the patient (e.g., review of test, records)  Obtaining and/or reviewing separately obtained history  Performing a medically necessary exam and/or evaluation  Counseling and educating the patient/family/caregiver  Ordering medications, tests, or procedures  Documenting clinical information in  the electronic or paper health record  Independently interpreting results of tests/labs and communication of results to the family or caregiver  Care coordination (when not reported separately)   Collaboration of Care: Collaboration of Care: Other reviewed notes in Epic  Patient/Guardian was advised Release of Information must be obtained prior to any record release in order to collaborate their care with an outside provider. Patient/Guardian was advised if they have not already done so to contact the registration department to sign all necessary forms in order for [place] to release information regarding their care.   Consent: Patient/Guardian gives verbal consent for treatment and assignment of benefits for services provided during this visit. Patient/Guardian expressed understanding and agreed to proceed.    Norman Clay, MD 02/14/2022, 1:10 PM

## 2022-02-14 ENCOUNTER — Other Ambulatory Visit
Admission: RE | Admit: 2022-02-14 | Discharge: 2022-02-14 | Disposition: A | Discharge status: Home / Self Care | Payer: Worker's Compensation | Source: Ambulatory Visit | Attending: Psychiatry | Admitting: Psychiatry

## 2022-02-14 ENCOUNTER — Encounter: Payer: Self-pay | Admitting: Psychiatry

## 2022-02-14 ENCOUNTER — Ambulatory Visit (INDEPENDENT_AMBULATORY_CARE_PROVIDER_SITE_OTHER): Payer: Worker's Compensation | Admitting: Psychiatry

## 2022-02-14 VITALS — BP 118/72 | HR 72 | Ht 71.0 in | Wt 207.6 lb

## 2022-02-14 DIAGNOSIS — F331 Major depressive disorder, recurrent, moderate: Secondary | ICD-10-CM

## 2022-02-14 DIAGNOSIS — R69 Illness, unspecified: Secondary | ICD-10-CM | POA: Diagnosis not present

## 2022-02-14 DIAGNOSIS — R419 Unspecified symptoms and signs involving cognitive functions and awareness: Secondary | ICD-10-CM

## 2022-02-14 LAB — VITAMIN B12: Vitamin B-12: 728 pg/mL (ref 180–914)

## 2022-02-14 LAB — COMPREHENSIVE METABOLIC PANEL
ALT: 13 U/L (ref 0–44)
AST: 22 U/L (ref 15–41)
Albumin: 4.1 g/dL (ref 3.5–5.0)
Alkaline Phosphatase: 90 U/L (ref 38–126)
Anion gap: 10 (ref 5–15)
BUN: 23 mg/dL (ref 8–23)
CO2: 27 mmol/L (ref 22–32)
Calcium: 9 mg/dL (ref 8.9–10.3)
Chloride: 102 mmol/L (ref 98–111)
Creatinine, Ser: 1.42 mg/dL — ABNORMAL HIGH (ref 0.61–1.24)
GFR, Estimated: 48 mL/min — ABNORMAL LOW (ref >60)
Glucose, Bld: 134 mg/dL — ABNORMAL HIGH (ref 70–99)
Potassium: 4.4 mmol/L (ref 3.5–5.1)
Sodium: 139 mmol/L (ref 135–145)
Total Bilirubin: 0.9 mg/dL (ref 0.3–1.2)
Total Protein: 7.4 g/dL (ref 6.5–8.1)

## 2022-02-14 LAB — CBC
HCT: 42.5 % (ref 39.0–52.0)
Hemoglobin: 14.2 g/dL (ref 13.0–17.0)
MCH: 29.4 pg (ref 26.0–34.0)
MCHC: 33.4 g/dL (ref 30.0–36.0)
MCV: 88 fL (ref 80.0–100.0)
Platelets: 150 10*3/uL (ref 150–400)
RBC: 4.83 MIL/uL (ref 4.22–5.81)
RDW: 12.8 % (ref 11.5–15.5)
WBC: 7.6 10*3/uL (ref 4.0–10.5)
nRBC: 0 % (ref 0.0–0.2)

## 2022-02-14 LAB — TSH: TSH: 2.963 u[IU]/mL (ref 0.350–4.500)

## 2022-02-14 LAB — FOLATE: Folate: 15.2 ng/mL (ref >5.9)

## 2022-02-14 MED ORDER — DULOXETINE HCL 20 MG PO CPEP: 40.0000 mg | ORAL_CAPSULE | Freq: Every day | ORAL | 1 refills | Status: DC

## 2022-02-14 NOTE — Patient Instructions (Signed)
Increase duloxetine 40 mg daily  Discontinue Vraylar Next appointment: 2/20 at 11 am

## 2022-02-15 ENCOUNTER — Encounter: Payer: Self-pay | Admitting: Psychiatry

## 2022-02-15 ENCOUNTER — Telehealth: Payer: Self-pay | Admitting: Psychiatry

## 2022-02-15 NOTE — Telephone Encounter (Signed)
Left a voice message regarding the blood test result. Details are available in my chart/letter. The patient was advised to contact the office if any questions.

## 2022-02-17 ENCOUNTER — Ambulatory Visit (INDEPENDENT_AMBULATORY_CARE_PROVIDER_SITE_OTHER): Payer: Worker's Compensation | Admitting: Licensed Clinical Social Worker

## 2022-02-17 DIAGNOSIS — F331 Major depressive disorder, recurrent, moderate: Secondary | ICD-10-CM | POA: Diagnosis not present

## 2022-02-17 DIAGNOSIS — R419 Unspecified symptoms and signs involving cognitive functions and awareness: Secondary | ICD-10-CM

## 2022-02-17 DIAGNOSIS — R69 Illness, unspecified: Secondary | ICD-10-CM | POA: Diagnosis not present

## 2022-02-17 NOTE — Progress Notes (Signed)
THERAPIST PROGRESS NOTE  Session Time: 9am-10am   Participation Level: Active  Behavioral Response: NeatAlertEuthymic  Type of Therapy: Individual Therapy  Treatment Goals addressed:  Depression: Reduce overall frequency, intensity and duration of depression so that daily functioning is not impaired per pt self report 3 out of 5 sessions documented.    Work with [name]" to identify the major components of a recent episode of depression: physical symptoms, major thoughts and images, and major behaviors they experienced.    ProgressTowards Goals: Not Progressing   GAD-7 on 01/24/2022 was a 9 GAD-7 on 02/17/2022 was a score of 16. PHQ-9 on 01/24/2022 was a 13 and on 02/17/2022 was a 16.   Interventions: CBT and Supportive  Pt presented in person at [facility] office. Pt and LCSW were present during the visit.    Summary: Chase Mendez is a 88 y.o. male who presents with continuing symptoms of anxiety and depression.   Pt stated that she has been feeling depressed and that he has been thinking a lot about the things that he is not able to do anymore and that he has been upset about the death of his neighbor last week and thinking about all the family and friends he has lost. Pt stated that the death of his neighbor has been difficult and that her funeral was yesterday.   Pt was able to explore in session things that bring him joy and was able to explore coping skills that he uses. Pt stated that he enjoys watching tv in his man cave and that he he enjoys listening to music. Pt stated that his eyesight is bad and that sometimes it is difficult for him to watch tv. Pt stated that his eye sight causes him distress and that he is going to be going to get an exam for his eyes next month and stated that he is looking forward to that because he does not like having to have his wife help him so much because he can not see.   GAD-7 on 01/24/2022 was a 9 GAD-7 on  02/17/2022 was a score of 16. PHQ-9 on 01/24/2022 was a 13 and on 02/17/2022 was a 16.   Allowed pt to explore thoughts and feelings associated with life situations and external stressors. Encouraged expression of feelings and used empathic listening. Pt appeared to be oriented to place and situation. LCSW validated the pts feelings and thoughts and showed unconditional positive regard. Pt reported that he has been having difficulties with memory. Pt was aware of the time, place and was able to recall his last appointment with his psychiatrist and was able to recall our last session and what we dicussed. Pt stated that he has been truing to continue to stay positive and that some days he is really down.   Pts wife was present in the end of the visit as requested by pt and she expressed that she does not mind helping her husband. Pts wife reports that the pt has been having difficulties with memory. Pt stated that he would think about the positive moments and memories he has of the things that he once enjoyed. Pt was able to explore his grief in session and was able to explore positive experiences that he had in his past in [employer] and the times that he enjoyed with his family camping and spending time with him.   Pt was able to explore how his thoughts impact how he feels. Pt  stated that he finds himself often thinking about his past and what he wanted to do then and what he can not do now. Pt stated that he wife is supportive and that he enjoys her help and companionship. Pt was able to explore his social support system and stated that his wife is his biggest support.   Pt denies SI/HI or A/V hallucinations.Pt was cooperative during visit and was engaged throughout the visit. Pt does not report any other concerns at the time of visit.   Suicidal/Homicidal: Nowithout intent/plan  Therapist Response:Educated the pt on positive coping skills to help with depression. Explored with the pt the negative  thinking is a feature of depression and that the positive experiences are minimized as a result. Explored how gratitude can help focus on the positive experiences, and not magnify the negative experiences. Explored with the pt to write about three positive experiences from their day. Engaged the pt to look at all positive experiences and that they can be small. Provided the pt with examples of positive experiences. Explored with the pt why the good experience was meaningful to them, engaged with the pt to explore how they can experience more of this good experience they had. Encouraged the pt to repeat this exercise every day for two weeks to strive to acknowledge positive experiences.    Explored with pt how one important way to regain emotional health is to develop and utilize healthy social supports. Engaged the pt to explore who their support system is to include any family members, friends, other resources or social groups that they are part of feel and important to. Explored that the support must be someone or a group that they trust and affirm their individuality. Encouraged the pt to explore any barriers that stand in the way in developing their support system to include: difficult time reaching out to others, low self-esteem, hard time making and keeping friends and any other barriers that prevent them from having social supports. Explored what the pt feels that they need and want from their support system and empowered the pt to commit themselves to develop a support sytem and to not give up on developing and utilizing their healthy support system. Explored with the pt how it makes them feel to be supportive and looked at how they can look for new supports and resources to help them with providing positive support.      Encouraged the pt to explore new activities that he can do and to try a new hobby to help alleviate depression symptoms before the next therapy visit. Pt expressed that he would.    Explored with the pt the grief process and explored how the loss of his neighbor has impacted  him.   Encouraged pt to take medications as prescribed by their psychiatrist.    Continued Recommendations as followed: Self-care behaviors, positive social engagements, focusing on positive physical and emotional wellness, and focusing on life/work balance.    Plan: Return again on 03/07/2022  Diagnosis:  Encounter Diagnoses  Name Primary?   MDD (major depressive disorder), recurrent episode, moderate ([place]) Yes   Neurocognitive disorder       Collaboration of Care: Pt encouraged to continue care with psychiatrist of record Dr. Modesta Messing.     Patient/Guardian was advised Release of Information must be obtained prior to any record release in order to collaborate their care with an outside provider. Patient/Guardian was advised if they have not already done so to contact the registration department to  sign all necessary forms in order for [place] to release information regarding their care.   Consent: Patient/Guardian gives verbal consent for treatment and assignment of benefits for services provided during this visit. Patient/Guardian expressed understanding and agreed to proceed.   Lorenda Hatchet 02/17/2022

## 2022-03-05 NOTE — Progress Notes (Unsigned)
PROVIDER NOTE: Information contained herein reflects review and annotations entered in association with encounter. Interpretation of such information and data should be left to medically-trained personnel. Information provided to patient can be located elsewhere in the medical record under "Patient Instructions". Document created using STT-dictation technology, any transcriptional errors that may result from process are unintentional.    Patient: Chase Mendez  Service Category: E/M  Provider: Gaspar Cola, MD  DOB: October 26, 1934  DOS: 03/06/2022  Referring Provider: Jodi Marble, MD  MRN: [id number]  Specialty: Interventional Pain Management  PCP: Chase Marble, MD  Type: Established Patient  Setting: Ambulatory outpatient    Location: Office  Delivery: Face-to-face     HPI  [name], a 88 y.o. year old male, is here today because of his No primary diagnosis found.. Chase Mendez primary complain today is No chief complaint on file. Last encounter: My last encounter with him was on 01/19/2022. Pertinent problems: Chase Mendez has Spinal accessory neuropathy; Lower limb pain, inferior (L5) (Foot Drop) (Left); Chronic low back pain (2ry area of Pain) (Bilateral) (ML) (L>R) w/ sciatica (Left); Chronic lower extremity pain (1ry area of Pain) (Left); Failed back surgical syndrome; Foot drop (Left); Lumbosacral radiculopathy at L5 (Left); DDD (degenerative disc disease), lumbar; Chronic pain syndrome; Thoracic central spinal stenosis; Chronic musculoskeletal pain; Neurogenic pain; Presence of neurostimulator; Gout of left foot due to renal impairment; Disturbance of skin sensation; Cervicalgia; Cervical facet syndrome; Bilateral lower abdominal pain; Abnormal MRI, thoracic spine (02/12/2018); DDD (degenerative disc disease), cervical; Cervicogenic headache; Painful cervical range of motion; Impaired range of motion of cervical spine; and Spondylosis without myelopathy or radiculopathy,  cervical region on their pertinent problem list. Pain Assessment: Severity of   is reported as a  /10. Location:    / . Onset:  . Quality:  . Timing:  . Modifying factor(s):  [name] Vitals:  vitals were not taken for this visit.  BMI: Estimated body mass index is 28.95 kg/m as calculated from the following:   Height as of 02/14/22: '5\' 11"'$  (1.803 m).   Weight as of 02/14/22: 207 lb 9.6 oz (94.2 kg).  Reason for encounter:  *** . ***  Pharmacotherapy Assessment  Analgesic: Oxycodone IR 10 mg, 1 tab PO q 6 hrs. (40 mg/day of oxycodone) MME/day: 60 mg/day.   Monitoring: [place] PMP: PDMP reviewed during this encounter.       Pharmacotherapy: No side-effects or adverse reactions reported. Compliance: No problems identified. Effectiveness: Clinically acceptable.  No notes on file  No results found for: "CBDTHCR" No results found for: "D8THCCBX" No results found for: "D9THCCBX"  UDS:  Summary  Date Value Ref Range Status  08/03/2021 Note  Final    Comment:    ==================================================================== [facility] (MW) ==================================================================== Test                             Result       Flag       Units  Drug Present and Declared for Prescription Verification   Oxycodone                      3770         EXPECTED   ng/mg creat   Oxymorphone                    1689         EXPECTED  ng/mg creat   Noroxycodone                   4295         EXPECTED   ng/mg creat   Noroxymorphone                 909          EXPECTED   ng/mg creat    Sources of oxycodone are scheduled prescription medications.    Oxymorphone, noroxycodone, and noroxymorphone are expected    metabolites of oxycodone. Oxymorphone is also available as a    scheduled prescription medication.  ==================================================================== Test                      Result    Flag   Units      Ref Range   Creatinine              148               mg/dL      >=20 ==================================================================== Declared Medications:  The flagging and interpretation on this report are based on the  following declared medications.  Unexpected results may arise from  inaccuracies in the declared medications.   **Note: The testing scope of this panel includes these medications:   Oxycodone   **Note: The testing scope of this panel does not include the  following reported medications:   Allopurinol (Zyloprim)  Amlodipine (Norvasc)  Aspirin  Atorvastatin (Lipitor)  Cyanocobalamin  Diphenhydramine (Benadryl)  Duloxetine (Cymbalta)  Gabapentin (Neurontin)  Isosorbide (Imdur)  Levothyroxine (Synthroid)  Losartan (Cozaar)  Ondansetron (Zofran)  Pantoprazole (Protonix)  Tamsulosin (Flomax)  Testosterone  Vitamin E ==================================================================== For clinical consultation, please call [phone]. ====================================================================       ROS  Constitutional: Denies any fever or chills Gastrointestinal: No reported hemesis, hematochezia, vomiting, or acute GI distress Musculoskeletal: Denies any acute onset joint swelling, redness, loss of ROM, or weakness Neurological: No reported episodes of acute onset apraxia, aphasia, dysarthria, agnosia, amnesia, paralysis, loss of coordination, or loss of consciousness  Medication Review  DULoxetine, Oxycodone HCl, Testosterone, allopurinol, amLODipine, aspirin, atorvastatin, cariprazine, cyanocobalamin, diphenhydrAMINE, fluticasone, gabapentin, isosorbide mononitrate, levothyroxine, losartan, ondansetron, pantoprazole, tamsulosin, and vitamin E  History Review  Allergy: Chase Mendez is allergic to doxycycline, ibuprofen, and sulfa antibiotics. Drug: Chase Mendez  reports current drug use. Drug: Oxycodone. Alcohol:  reports no history of alcohol use. Tobacco:  reports that he has  been smoking cigars and cigarettes. He has a 35.00 pack-year smoking history. He has never used smokeless tobacco. Social: Chase Mendez  reports that he has been smoking cigars and cigarettes. He has a 35.00 pack-year smoking history. He has never used smokeless tobacco. He reports current drug use. Drug: Oxycodone. He reports that he does not drink alcohol. Medical:  has a past medical history of Acute encephalopathy (12/08/2014), Anxiety, ARF (acute renal failure) ([place]) (12/08/2014), Back pain, Benign neoplasm of large bowel, Capsulitis, Chronic back pain, Coronavirus infection, Depression, Diabetes mellitus without complication ([place]), Dysphagia, Exostosis, Foot drop, left, Frequent falls (02/2018), GERD (gastroesophageal reflux disease), Gout, Hypertension, Hypothyroidism, Insomnia, Low testosterone, Microscopic hematuria (2016), Myocardial infarction ([place]), Pneumonia (12/07/2014), Pressure ulcer (12/09/2014), Sepsis ([place]) (12/08/2014), and Thyroid disease. Surgical: Chase Mendez  has a past surgical history that includes Cholecystectomy; Esophagogastroduodenoscopy (egd) with propofol (N/A, 04/24/2017); Back surgery (1995, 1996); Eye surgery (Bilateral, 1983, 1985); Appendectomy; and Spinal cord stimulator insertion (N/A, 03/13/2018). Family: family history includes Diabetes in his  father; Heart disease in his mother; Kidney cancer in his brother.  Laboratory Chemistry Profile   Renal Lab Results  Component Value Date   BUN 23 02/14/2022   CREATININE 1.42 (H) 02/14/2022   GFRAA >60 01/28/2019   GFRNONAA 48 (L) 02/14/2022    Hepatic Lab Results  Component Value Date   AST 22 02/14/2022   ALT 13 02/14/2022   ALBUMIN 4.1 02/14/2022   ALKPHOS 90 02/14/2022   HCVAB <0.1 07/24/2018   LIPASE 37 08/21/2016    Electrolytes Lab Results  Component Value Date   NA 139 02/14/2022   K 4.4 02/14/2022   CL 102 02/14/2022   CALCIUM 9.0 02/14/2022    Bone No results found for: "VD25OH", "VD125OH2TOT",  "FT7322GU5", "KY7062BJ6", "25OHVITD1", "25OHVITD2", "28BTDVVO1", "TESTOFREE", "TESTOSTERONE"  Inflammation (CRP: Acute Phase) (ESR: Chronic Phase) Lab Results  Component Value Date   LATICACIDVEN 1.1 12/08/2014         Note: Above Lab results reviewed.  Recent Imaging Review  DG PAIN CLINIC C-ARM 1-60 MIN NO REPORT Fluoro was used, but no Radiologist interpretation will be provided.  Please refer to "NOTES" tab for provider progress note. Note: Reviewed        Physical Exam  General appearance: Well nourished, well developed, and well hydrated. In no apparent acute distress Mental status: Alert, oriented x 3 (person, place, & time)       Respiratory: No evidence of acute respiratory distress Eyes: PERLA Vitals: There were no vitals taken for this visit. BMI: Estimated body mass index is 28.95 kg/m as calculated from the following:   Height as of 02/14/22: '5\' 11"'$  (1.803 m).   Weight as of 02/14/22: 207 lb 9.6 oz (94.2 kg). Ideal: Patient weight not recorded  Assessment   Diagnosis Status  No diagnosis found. Controlled Controlled Controlled   Updated Problems: No problems updated.  Plan of Care  Problem-specific:  No problem-specific Assessment & Plan notes found for this encounter.  Mr. GENNARO LIZOTTE has a current medication list which includes the following long-term medication(s): allopurinol, amlodipine, duloxetine, isosorbide mononitrate, levothyroxine, losartan, oxycodone hcl, oxycodone hcl, pantoprazole, testosterone, and [DISCONTINUED] diphenhydramine.  Pharmacotherapy (Medications Ordered): No orders of the defined types were placed in this encounter.  Orders:  No orders of the defined types were placed in this encounter.  Follow-up plan:   No follow-ups on file.     Interventional Therapies  Risk  Complexity Considerations:   Estimated body mass index is 26.04 kg/m as calculated from the following:   Height as of this encounter: 6' (1.829 m).   Weight  as of this encounter: 192 lb (87.1 kg). WNL   Planned  Pending:      Under consideration:   Diagnostic Caudal ESI + epidurogram #1  Possible Racz procedure  Diagnostic bilateral lumbar facet MBB  Possible bilateral lumbar facet RFA  Possible candidate for intrathecal pump trial and implant    Completed:   Diagnostic/therapeutic bilateral cervical facet MBB x1 (01/05/2022) (100/100/100/100)  Diagnostic/therapeutic left caudal ESI + epidurogram x1 (11/17/2021) (100/100/25/25)    Therapeutic  Palliative (PRN) options:   Therapeutic/palliative left L4 TFESI #2  Therapeutic/palliative left L5 TFESI #2  Bilateral spinal cord stimulator trial (done - 01/17/2018)  Permanent bilateral spinal cord stimulator implant by Dr. Cari Caraway (neurosurgery) (done - 03/13/2018)   Pharmacotherapy:  Nonopioids transferred 01/12/2020: Gabapentin Recommendations:   None at this time.       Recent Visits Date Type Provider Dept  01/19/22 Office Visit Milinda Pointer,  MD [facility]  01/05/22 Procedure visit Milinda Pointer, MD [facility]  12/26/21 Office Visit Milinda Pointer, MD [facility]  Showing recent visits within past 90 days and meeting all other requirements Future Appointments Date Type Provider Dept  03/06/22 Appointment Milinda Pointer, MD [facility]  Showing future appointments within next 90 days and meeting all other requirements  I discussed the assessment and treatment plan with the patient. The patient was provided an opportunity to ask questions and all were answered. The patient agreed with the plan and demonstrated an understanding of the instructions.  Patient advised to call back or seek an in-person evaluation if the symptoms or condition worsens.  Duration of encounter: *** minutes.  Total time on encounter, as per AMA guidelines included both the face-to-face and non-face-to-face time personally spent by the  physician and/or other qualified health care professional(s) on the day of the encounter (includes time in activities that require the physician or other qualified health care professional and does not include time in activities normally performed by clinical staff). Physician's time may include the following activities when performed: Preparing to see the patient (e.g., pre-charting review of records, searching for previously ordered imaging, lab work, and nerve conduction tests) Review of prior analgesic pharmacotherapies. Reviewing PMP Interpreting ordered tests (e.g., lab work, imaging, nerve conduction tests) Performing post-procedure evaluations, including interpretation of diagnostic procedures Obtaining and/or reviewing separately obtained history Performing a medically appropriate examination and/or evaluation Counseling and educating the patient/family/caregiver Ordering medications, tests, or procedures Referring and communicating with other health care professionals (when not separately reported) Documenting clinical information in the electronic or other health record Independently interpreting results (not separately reported) and communicating results to the patient/ family/caregiver Care coordination (not separately reported)  Note by: Chase Cola, MD Date: 03/06/2022; Time: 6:06 PM

## 2022-03-06 ENCOUNTER — Ambulatory Visit: Payer: Worker's Compensation | Attending: Pain Medicine | Admitting: Pain Medicine

## 2022-03-06 ENCOUNTER — Encounter: Payer: Self-pay | Admitting: Pain Medicine

## 2022-03-06 VITALS — BP 131/67 | HR 66 | Temp 97.3°F | Ht 72.0 in | Wt 200.0 lb

## 2022-03-06 DIAGNOSIS — M47812 Spondylosis without myelopathy or radiculopathy, cervical region: Secondary | ICD-10-CM | POA: Insufficient documentation

## 2022-03-06 DIAGNOSIS — M5442 Lumbago with sciatica, left side: Secondary | ICD-10-CM | POA: Insufficient documentation

## 2022-03-06 DIAGNOSIS — M79605 Pain in left leg: Secondary | ICD-10-CM | POA: Diagnosis not present

## 2022-03-06 DIAGNOSIS — M961 Postlaminectomy syndrome, not elsewhere classified: Secondary | ICD-10-CM | POA: Insufficient documentation

## 2022-03-06 DIAGNOSIS — M5030 Other cervical disc degeneration, unspecified cervical region: Secondary | ICD-10-CM | POA: Insufficient documentation

## 2022-03-06 DIAGNOSIS — M542 Cervicalgia: Secondary | ICD-10-CM | POA: Diagnosis present

## 2022-03-06 DIAGNOSIS — G894 Chronic pain syndrome: Secondary | ICD-10-CM | POA: Diagnosis not present

## 2022-03-06 DIAGNOSIS — Z79899 Other long term (current) drug therapy: Secondary | ICD-10-CM | POA: Insufficient documentation

## 2022-03-06 DIAGNOSIS — Z79891 Long term (current) use of opiate analgesic: Secondary | ICD-10-CM | POA: Diagnosis not present

## 2022-03-06 DIAGNOSIS — M5136 Other intervertebral disc degeneration, lumbar region: Secondary | ICD-10-CM | POA: Insufficient documentation

## 2022-03-06 DIAGNOSIS — G4486 Cervicogenic headache: Secondary | ICD-10-CM | POA: Insufficient documentation

## 2022-03-06 DIAGNOSIS — M5417 Radiculopathy, lumbosacral region: Secondary | ICD-10-CM | POA: Insufficient documentation

## 2022-03-06 DIAGNOSIS — G8929 Other chronic pain: Secondary | ICD-10-CM | POA: Insufficient documentation

## 2022-03-06 MED ORDER — OXYCODONE HCL 10 MG PO TABS: 10.0000 mg | ORAL_TABLET | Freq: Four times a day (QID) | ORAL | 0 refills | Status: DC | PRN

## 2022-03-06 MED ORDER — NALOXONE HCL 4 MG/0.1ML NA LIQD: 1.0000 | NASAL | 0 refills | Status: DC | PRN

## 2022-03-06 NOTE — Patient Instructions (Addendum)
______________________________________________________________________  Procedure instructions  Do not eat or drink fluids (other than water) for 6 hours before your procedure  No water for 2 hours before your procedure  Take your blood pressure medicine with a sip of water  Arrive 30 minutes before your appointment  Carefully read the "Preparing for your procedure" detailed instructions  If you have questions call us at [phone]  _____________________________________________________________________    ______________________________________________________________________  Preparing for your procedure  During your procedure appointment there will be: No Prescription Refills. No disability issues to discussed. No medication changes or discussions.  Instructions: Food intake: Avoid eating anything solid for at least 8 hours prior to your procedure. Clear liquid intake: You may take clear liquids such as water up to 2 hours prior to your procedure. (No carbonated drinks. No soda.) Transportation: Unless otherwise stated by your physician, bring a driver. Morning Medicines: Except for blood thinners, take all of your other morning medications with a sip of water. Make sure to take your heart and blood pressure medicines. If your blood pressure's lower number is above 100, the case will be rescheduled. Blood thinners: Make sure to stop your blood thinners as instructed.  If you take a blood thinner, but were not instructed to stop it, call our office [phone] and ask to talk to a nurse. Not stopping a blood thinner prior to certain procedures could lead to serious complications. Diabetics on insulin: Notify the staff so that you can be scheduled 1st case in the morning. If your diabetes requires high dose insulin, take only  of your normal insulin dose the morning of the procedure and notify the staff that you have done so. Preventing infections: Shower with an  antibacterial soap the morning of your procedure.  Build-up your immune system: Take 1000 mg of Vitamin C with every meal (3 times a day) the day prior to your procedure. Antibiotics: Inform the nursing staff if you are taking any antibiotics or if you have any conditions that may require antibiotics prior to procedures. (Example: recent joint implants)   Pregnancy: If you are pregnant make sure to notify the nursing staff. Not doing so may result in injury to the fetus, including death.  Sickness: If you have a cold, fever, or any active infections, call and cancel or reschedule your procedure. Receiving steroids while having an infection may result in complications. Arrival: You must be in the facility at least 30 minutes prior to your scheduled procedure. Tardiness: Your scheduled time is also the cutoff time. If you do not arrive at least 15 minutes prior to your procedure, you will be rescheduled.  Children: Do not bring any children with you. Make arrangements to keep them home. Dress appropriately: There is always a possibility that your clothing may get soiled. Avoid long dresses. Valuables: Do not bring any jewelry or valuables.  Reasons to call and reschedule or cancel your procedure: (Following these recommendations will minimize the risk of a serious complication.) Surgeries: Avoid having procedures within 2 weeks of any surgery. (Avoid for 2 weeks before or after any surgery). Flu Shots: Avoid having procedures within 2 weeks of a flu shots or . (Avoid for 2 weeks before or after immunizations). Barium: Avoid having a procedure within 7-10 days after having had a radiological study involving the use of radiological contrast. (Myelograms, Barium swallow or enema study). Heart attacks: Avoid any elective procedures or surgeries for the initial 6 months after a "Myocardial Infarction" (Heart Attack). Blood thinners: It  is imperative that you stop these medications before procedures. Let us  know if you if you take any blood thinner.  Infection: Avoid procedures during or within two weeks of an infection (including chest colds or gastrointestinal problems). Symptoms associated with infections include: Localized redness, fever, chills, night sweats or profuse sweating, burning sensation when voiding, cough, congestion, stuffiness, runny nose, sore throat, diarrhea, nausea, vomiting, cold or Flu symptoms, recent or current infections. It is specially important if the infection is over the area that we intend to treat. Heart and lung problems: Symptoms that may suggest an active cardiopulmonary problem include: cough, chest pain, breathing difficulties or shortness of breath, dizziness, ankle swelling, uncontrolled high or unusually low blood pressure, and/or palpitations. If you are experiencing any of these symptoms, cancel your procedure and contact your primary care physician for an evaluation.  Remember:  Regular Business hours are:  Monday to Thursday 8:00 AM to 4:00 PM  Provider's Schedule: Milinda Pointer, MD:  Procedure days: Tuesday and Thursday 7:30 AM to 4:00 PM  Gillis Santa, MD:  Procedure days: Monday and Wednesday 7:30 AM to 4:00 PM  ______________________________________________________________________    ____________________________________________________________________________________________  General Risks and Possible Complications  Patient Responsibilities: It is important that you read this as it is part of your informed consent. It is our duty to inform you of the risks and possible complications associated with treatments offered to you. It is your responsibility as a patient to read this and to ask questions about anything that is not clear or that you believe was not covered in this document.  Patient's Rights: You have the right to refuse treatment. You also have the right to change your mind, even after initially having agreed to have the treatment  done. However, under this last option, if you wait until the last second to change your mind, you may be charged for the materials used up to that point.  Introduction: Medicine is not an Chief Strategy Officer. Everything in Medicine, including the lack of treatment(s), carries the potential for danger, harm, or loss (which is by definition: Risk). In Medicine, a complication is a secondary problem, condition, or disease that can aggravate an already existing one. All treatments carry the risk of possible complications. The fact that a side effects or complications occurs, does not imply that the treatment was conducted incorrectly. It must be clearly understood that these can happen even when everything is done following the highest safety standards.  No treatment: You can choose not to proceed with the proposed treatment alternative. The "PRO(s)" would include: avoiding the risk of complications associated with the therapy. The "CON(s)" would include: not getting any of the treatment benefits. These benefits fall under one of three categories: diagnostic; therapeutic; and/or palliative. Diagnostic benefits include: getting information which can ultimately lead to improvement of the disease or symptom(s). Therapeutic benefits are those associated with the successful treatment of the disease. Finally, palliative benefits are those related to the decrease of the primary symptoms, without necessarily curing the condition (example: decreasing the pain from a flare-up of a chronic condition, such as incurable terminal cancer).  General Risks and Complications: These are associated to most interventional treatments. They can occur alone, or in combination. They fall under one of the following six (6) categories: no benefit or worsening of symptoms; bleeding; infection; nerve damage; allergic reactions; and/or death. No benefits or worsening of symptoms: In Medicine there are no guarantees, only probabilities. No  healthcare provider can ever guarantee that a  medical treatment will work, they can only state the probability that it may. Furthermore, there is always the possibility that the condition may worsen, either directly, or indirectly, as a consequence of the treatment. Bleeding: This is more common if the patient is taking a blood thinner, either prescription or over the counter (example: Goody Powders, Fish oil, Aspirin, Garlic, etc.), or if suffering a condition associated with impaired coagulation (example: Hemophilia, cirrhosis of the liver, low platelet counts, etc.). However, even if you do not have one on these, it can still happen. If you have any of these conditions, or take one of these drugs, make sure to notify your treating physician. Infection: This is more common in patients with a compromised immune system, either due to disease (example: diabetes, cancer, human immunodeficiency virus [HIV], etc.), or due to medications or treatments (example: therapies used to treat cancer and rheumatological diseases). However, even if you do not have one on these, it can still happen. If you have any of these conditions, or take one of these drugs, make sure to notify your treating physician. Nerve Damage: This is more common when the treatment is an invasive one, but it can also happen with the use of medications, such as those used in the treatment of cancer. The damage can occur to small secondary nerves, or to large primary ones, such as those in the spinal cord and brain. This damage may be temporary or permanent and it may lead to impairments that can range from temporary numbness to permanent paralysis and/or brain death. Allergic Reactions: Any time a substance or material comes in contact with our body, there is the possibility of an allergic reaction. These can range from a mild skin rash (contact dermatitis) to a severe systemic reaction (anaphylactic reaction), which can result in death. Death: In  general, any medical intervention can result in death, most of the time due to an unforeseen complication. ____________________________________________________________________________________________    ____________________________________________________________________________________________  Opioid Pain Medication Update  To: All patients taking opioid pain medications. (I.e.: hydrocodone, hydromorphone, oxycodone, oxymorphone, morphine, codeine, methadone, tapentadol, tramadol, buprenorphine, fentanyl, etc.)  Re: Review of side effects and adverse reactions of opioid analgesics, as well as new information about long term effects of this class of medications.  Direct risks of long-term opioid therapy are not limited to opioid addiction and overdose. Potential medical risks include serious fractures, breathing problems during sleep, hyperalgesia, immunosuppression, chronic constipation, bowel obstruction, myocardial infarction, and tooth decay secondary to xerostomia.  Historically, the original case for using long-term opioid therapy to treat chronic noncancer pain was based on safety assumptions that subsequent experience has called into question. In 1996, the American Pain Society and the [place] Academy of [employer] issued a consensus statement supporting long-term opioid therapy. This statement acknowledged the dangers of opioid prescribing but concluded that the risk for addiction was low; respiratory depression induced by opioids was short-lived, occurred mainly in opioid-naive patients, and was antagonized by pain; tolerance was not a common problem; and efforts to control diversion should not constrain opioid prescribing. This has now proven to be wrong. Experience regarding the risks for opioid addiction, misuse, and overdose in community practice has failed to support these assumptions.  According to the Centers for Disease Control and Prevention, fatal overdoses involving opioid  analgesics have increased sharply over the past decade. Currently, more than 96,700 people die from drug overdoses every year. Opioids are a factor in 7 out of every 10 overdose deaths. Deaths from drug overdose  have surpassed motor vehicle accidents as the leading cause of death for individuals between the ages of 61 and 45.  Clinical data suggest that neuroendocrine dysfunction may be very common in both men and women, potentially causing hypogonadism, erectile dysfunction, infertility, decreased libido, osteoporosis, and depression. Recent studies linked higher opioid dose to increased opioid-related mortality. Controlled observational studies reported that long-term opioid therapy may be associated with increased risk for cardiovascular events. Subsequent meta-analysis concluded that the safety of long-term opioid therapy in elderly patients has not been proven.   Side Effects and adverse reactions: Common side effects: Drowsiness (sedation). Dizziness. Nausea and vomiting. Constipation. Physical dependence -- Dependence often manifests with withdrawal symptoms when opioids are discontinued or decreased. Tolerance -- As you take repeated doses of opioids, you require increased medication to experience the same effect of pain relief. Respiratory depression -- This can occur in healthy people, especially with higher doses. However, people with COPD, asthma or other lung conditions may be even more susceptible to fatal respiratory impairment.  Uncommon side effects: An increased sensitivity to feeling pain and extreme response to pain (hyperalgesia). Chronic use of opioids can lead to this. Delayed gastric emptying (the process by which the contents of your stomach are moved into your small intestine). Muscle rigidity. Immune system and hormonal dysfunction. Quick, involuntary muscle jerks (myoclonus). Arrhythmia. Itchy skin (pruritus). Dry mouth (xerostomia).  Long-term side  effects: Chronic constipation. Sleep-disordered breathing (SDB). Increased risk of bone fractures. Hypothalamic-pituitary-adrenal dysregulation. Increased risk of overdose.  RISKS: Fractures and Falls:  Opioids increase the risk and incidence of falls. This is of particular importance in elderly patients.  Endocrine System:  Long-term administration is associated with endocrine abnormalities. Influences on both the hypothalamic-pituitary-adrenal axis?and the hypothalamic-pituitary-gonadal axis have been demonstrated with consequent hypogonadism and adrenal insufficiency in both sexes. Hypogonadism and decreased levels of dehydroepiandrosterone sulfate have been reported in men and women. Endocrine effects can lead to: Amenorrhoea in women Reduced libido in both sexes Erectile dysfunction in men Infertility Depression and fatigue Patients (particularly women of childbearing age) should avoid opioids. There is insufficient evidence to recommend routine monitoring of asymptomatic patients taking opioids in the long-term for hormonal deficiencies.  Immune System: Human studies have demonstrated that opioids have an immunomodulating effect. These effects are mediated via opioid receptors both on immune effector cells and in the central nervous system. Opioids have been demonstrated to have adverse effects on antimicrobial response and anti-tumour surveillance. Buprenorphine has been demonstrated to have no impact on immune function.  Opioid Induced Hyperalgesia: Human studies have demonstrated that prolonged use of opioids can lead to a state of abnormal pain sensitivity, sometimes called opioid induced hyperalgesia (OIH). Opioid induced hyperalgesia is not usually seen in the absence of tolerance to opioid analgesia. Clinically, hyperalgesia may be diagnosed if the patient on long-term opioid therapy presents with increased pain. This might be qualitatively and anatomically distinct from  pain related to disease progression or to breakthrough pain resulting from development of opioid tolerance. Pain associated with hyperalgesia tends to be more diffuse than the pre-existing pain and less defined in quality. Management of opioid induced hyperalgesia requires opioid dose reduction.  Cancer: Chronic opioid therapy has been associated with an increased risk of cancer among noncancer patients with chronic pain. This association was more evident in chronic strong opioid users. Chronic opioid consumption causes significant pathological changes in the small intestine and colon. Epidemiological studies have found that there is a link between opium dependence and initiation of gastrointestinal cancers.  Cancer is the second leading cause of death after cardiovascular disease. Chronic use of opioids can cause multiple conditions such as GERD, immunosuppression and renal damage as well as carcinogenic effects, which are associated with the incidence of cancers.   Mortality: Long-term opioid use has been associated with increased mortality among patients with chronic non-cancer pain (CNCP).  Prescription of long-acting opioids for chronic noncancer pain was associated with a significantly increased risk of all-cause mortality, including deaths from causes other than overdose.  Reference: Von Korff M, Kolodny A, Deyo RA, Chou R. Long-term opioid therapy reconsidered. Ann Intern Med. 2011 Sep 6;155(5):325-8. doi: 10.7326/[id number]. PMID: 36144315; PMCID: [id number]. Morley Kos, Hayward RA, Dunn KM, [place] KP. Risk of adverse events in patients prescribed long-term opioids: A cohort study in the [place] Clinical Practice Research [employer]. Eur J Pain. 2019 May;23(5):908-922. doi: 10.1002/ejp.1357. Epub 2019 Jan 31. PMID: 09326712. Colameco S, Coren JS, Ciervo CA. Continuous opioid treatment for chronic noncancer pain: a time for moderation in prescribing. Postgrad Med.  2009 Jul;121(4):61-6. doi: 10.3810/pgm.2009.07.2032. PMID: 45809983. Heywood Bene RN, McKenna SD, Blazina I, Rosalio Loud, Bougatsos C, Deyo RA. The effectiveness and risks of long-term opioid therapy for chronic pain: a systematic review for a [employer] of Health Pathways to [employer]. Ann Intern Med. 2015 Feb 17;162(4):276-86. doi: 38.2505/L97-6734. PMID: 19379024. Marjory Sneddon [facility], Makuc DM. NCHS Data Brief No. 22. [place]: Centers for Disease Control and Prevention; 2009. Sep, Increase in Fatal Poisonings Involving Opioid Analgesics in the [place], 1999-2006. Song IA, Choi HR, Oh TK. Long-term opioid use and mortality in patients with chronic non-cancer pain: Ten-year follow-up study in [place] from 2010 through 2019. EClinicalMedicine. 2022 Jul 18;51:101558. doi: 10.1016/j.eclinm.2022.097353. PMID: 29924268; PMCID: [id number]. Huser, W., Schubert, T., Vogelmann, T. et al. All-cause mortality in patients with long-term opioid therapy compared with non-opioid analgesics for chronic non-cancer pain: a database study. Frontier Med 18, 162 (2020). [email or web address].[email or web address].com/ Rashidian H, Roxy Cedar, Malekzadeh R, Haghdoost AA. An Ecological Study of the Association between Opiate Use and Incidence of Cancers. Addict Health. 2016 Fall;8(4):252-260. PMID: 98921194; PMCID: [id number].  Our Goals and Recommendations: Our goal is to control your pain with means other than the use of opioid pain medications. Talk to your physician about coming off of these medications. We can assist you with the tapering down and stopping these medicines. Based on the information above, even if you cannot completely stop these medicines, even a decrease in the dose has been shown to be associated with a decreased risk.  ____________________________________________________________________________________________      ____________________________________________________________________________________________  National Pain Medication Shortage  The [place] is experiencing worsening drug shortages. These have had a negative widespread effect on patient care and treatment. Not expected to improve any time soon. Predicted to last past 2029.   Drug shortage list (generic names) Oxycodone IR Oxycodone/APAP Oxymorphone IR Hydromorphone Hydrocodone/APAP Morphine  Where is the problem?  Manufacturing and supply level.  Will this shortage affect you?  Only if you take any of the above pain medications.  How? You may be unable to fill your prescription.  Your pharmacist may offer a "partial fill" of your prescription. (Warning: Do not accept partial fills.) Prescriptions partially filled cannot be transferred to another pharmacy. Read our Medication Rules and Regulation. Depending on how much medicine you are dependent on, you may experience withdrawals when unable to get the medication.  Recommendations: Consider ending your dependence on opioid pain medications. Ask  your pain specialist to assist you with the process. Consider switching to a medication currently not in shortage, such as Buprenorphine. Talk to your pain specialist about this option. Consider decreasing your pain medication requirements by managing tolerance thru "Drug Holidays". This may help minimize withdrawals, should you run out of medicine. Control your pain thru the use of non-pharmacological interventional therapies.   Your prescriber: Prescribers cannot be blamed for shortages. Medication manufacturing and supply issues cannot be fixed by the prescriber.   NOTE: The prescriber is not responsible for supplying the medication, or solving supply issues. Work with your pharmacist to solve it. The patient is responsible for the decision to take or continue taking the medication and for identifying and securing a legal supply source. By  law, supplying the medication is the job and responsibility of the pharmacy. The prescriber is responsible for the evaluation, monitoring, and prescribing of these medications.   Prescribers will NOT: Re-issue prescriptions that have been partially filled. Re-issue prescriptions already sent to a pharmacy.  Re-send prescriptions to a different pharmacy because yours did not have your medication. Ask pharmacist to order more medicine or transfer the prescription to another pharmacy. (Read below.)  New 2023 regulation: "October 07, 2021 Revised Regulation Allows DEA-Registered Pharmacies to Transfer Electronic Prescriptions at a Patient's Request [place] Patients now have the ability to request their electronic prescription be transferred to another pharmacy without having to go back to their practitioner to initiate the request. This revised regulation went into effect on Monday, October 03, 2021.     At a patient's request, a DEA-registered retail pharmacy can now transfer an electronic prescription for a controlled substance (schedules II-V) to another DEA-registered retail pharmacy. Prior to this change, patients would have to go through their practitioner to cancel their prescription and have it re-issued to a different pharmacy. The process was taxing and time consuming for both patients and practitioners.    The Drug Enforcement Administration [facility]) published its intent to revise the process for transferring electronic prescriptions on December 26, 2019.  The final rule was published in the federal register on September 01, 2021 and went into effect 30 days later.  Under the final rule, a prescription can only be transferred once between pharmacies, and only if allowed under existing state or other applicable law. The prescription must remain in its electronic form; may not be altered in any way; and the transfer must be communicated directly between  two licensed pharmacists. It's important to note, any authorized refills transfer with the original prescription, which means the entire prescription will be filled at the same pharmacy".  Reference: [email or web address] [facility] website announcement)  [email or web address] ([place])   [employer] / Vol. 88, No. 143 / Thursday, September 01, 2021 / Rules and Regulations DEPARTMENT OF JUSTICE  Drug Enforcement Administration  21 CFR Part 1306  [Docket No. [id number]]  RIN [id number] Transfer of Electronic Prescriptions for Schedules II-V Controlled Substances Between Pharmacies for Initial Filling  ____________________________________________________________________________________________     _______________________________________________________________________  Medication Rules  Purpose: To inform patients, and their family members, of our medication rules and regulations.  Applies to: All patients receiving prescriptions from our practice (written or electronic).  Pharmacy of record: This is the pharmacy where your electronic prescriptions will be sent. Make sure we have the correct one.  Electronic prescriptions: In compliance with the [place] (STOP) Act of 2017 (Session Law [phone]), effective  February 06, 2018, all controlled substances must be electronically prescribed. Written prescriptions, faxing, or calling prescriptions to a pharmacy will no longer be done.  Prescription refills: These will be provided only during in-person appointments. No medications will be renewed without a "face-to-face" evaluation with your provider. Applies to all prescriptions.  NOTE: The following applies primarily to controlled substances (Opioid* Pain Medications).   Type of encounter  (visit): For patients receiving controlled substances, face-to-face visits are required. (Not an option and not up to the patient.)  Patient's responsibilities: Pain Pills: Bring all pain pills to every appointment (except for procedure appointments). Pill Bottles: Bring pills in original pharmacy bottle. Bring bottle, even if empty. Always bring the bottle of the most recent fill.  Medication refills: You are responsible for knowing and keeping track of what medications you are taking and when is it that you will need a refill. The day before your appointment: write a list of all prescriptions that need to be refilled. The day of the appointment: give the list to the admitting nurse. Prescriptions will be written only during appointments. No prescriptions will be written on procedure days. If you forget a medication: it will not be "Called in", "Faxed", or "electronically sent". You will need to get another appointment to get these prescribed. No early refills. Do not call asking to have your prescription filled early. Partial  or short prescriptions: Occasionally your pharmacy may not have enough pills to fill your prescription.  NEVER ACCEPT a partial fill or a prescription that is short of the total amount of pills that you were prescribed.  With controlled substances the law allows 72 hours for the pharmacy to complete the prescription.  If the prescription is not completed within 72 hours, the pharmacist will require a new prescription to be written. This means that you will be short on your medicine and we WILL NOT send another prescription to complete your original prescription.  Instead, request the pharmacy to send a carrier to a nearby branch to get enough medication to provide you with your full prescription. Prescription Accuracy: You are responsible for carefully inspecting your prescriptions before leaving our office. Have the discharge nurse carefully go over each prescription with you,  before taking them home. Make sure that your name is accurately spelled, that your address is correct. Check the name and dose of your medication to make sure it is accurate. Check the number of pills, and the written instructions to make sure they are clear and accurate. Make sure that you are given enough medication to last until your next medication refill appointment. Taking Medication: Take medication as prescribed. When it comes to controlled substances, taking less pills or less frequently than prescribed is permitted and encouraged. Never take more pills than instructed. Never take the medication more frequently than prescribed.  Inform other Doctors: Always inform, all of your healthcare providers, of all the medications you take. Pain Medication from other Providers: You are not allowed to accept any additional pain medication from any other Doctor or Healthcare provider. There are two exceptions to this rule. (see below) In the event that you require additional pain medication, you are responsible for notifying us, as stated below. Cough Medicine: Often these contain an opioid, such as codeine or hydrocodone. Never accept or take cough medicine containing these opioids if you are already taking an opioid* medication. The combination may cause respiratory failure and death. Medication Agreement: You are responsible for carefully reading and following our Medication Agreement.  This must be signed before receiving any prescriptions from our practice. Safely store a copy of your signed Agreement. Violations to the Agreement will result in no further prescriptions. (Additional copies of our Medication Agreement are available upon request.) Laws, Rules, & Regulations: All patients are expected to follow all Federal and [employer], [employer], Rules, [employer] [facility]. Ignorance of the Laws does not constitute a valid excuse.  Illegal drugs and Controlled Substances: The use of illegal substances (including,  but not limited to marijuana and its derivatives) and/or the illegal use of any controlled substances is strictly prohibited. Violation of this rule may result in the immediate and permanent discontinuation of any and all prescriptions being written by our practice. The use of any illegal substances is prohibited. Adopted CDC guidelines & recommendations: Target dosing levels will be at or below 60 MME/day. Use of benzodiazepines** is not recommended.  Exceptions: There are only two exceptions to the rule of not receiving pain medications from other Healthcare Providers. Exception #1 (Emergencies): In the event of an emergency (i.e.: accident requiring emergency care), you are allowed to receive additional pain medication. However, you are responsible for: As soon as you are able, call our office [phone], at any time of the day or night, and leave a message stating your name, the date and nature of the emergency, and the name and dose of the medication prescribed. In the event that your call is answered by a member of our staff, make sure to document and save the date, time, and the name of the person that took your information.  Exception #2 (Planned Surgery): In the event that you are scheduled by another doctor or dentist to have any type of surgery or procedure, you are allowed (for a period no longer than 30 days), to receive additional pain medication, for the acute post-op pain. However, in this case, you are responsible for picking up a copy of our "Post-op Pain Management for Surgeons" handout, and giving it to your surgeon or dentist. This document is available at our office, and does not require an appointment to obtain it. Simply go to our office during business hours (Monday-Thursday from 8:00 AM to 4:00 PM) (Friday 8:00 AM to 12:00 Noon) or if you have a scheduled appointment with [place], prior to your surgery, and ask for it by name. In addition, you are responsible for: calling our office  [phone], at any time of the day or night, and leaving a message stating your name, name of your surgeon, type of surgery, and date of procedure or surgery. Failure to comply with your responsibilities may result in termination of therapy involving the controlled substances. Medication Agreement Violation. Following the above rules, including your responsibilities will help you in avoiding a Medication Agreement Violation ("Breaking your Pain Medication Contract").  Consequences:  Not following the above rules may result in permanent discontinuation of medication prescription therapy.  *Opioid medications include: morphine, codeine, oxycodone, oxymorphone, hydrocodone, hydromorphone, meperidine, tramadol, tapentadol, buprenorphine, fentanyl, methadone. **Benzodiazepine medications include: diazepam (Valium), alprazolam (Xanax), clonazepam (Klonopine), lorazepam (Ativan), clorazepate (Tranxene), chlordiazepoxide (Librium), estazolam (Prosom), oxazepam (Serax), temazepam (Restoril), triazolam (Halcion) (Last updated: 11/29/2021) ______________________________________________________________________    ______________________________________________________________________  Medication Recommendations and Reminders  Applies to: All patients receiving prescriptions (written and/or electronic).  Medication Rules & Regulations: You are responsible for reading, knowing, and following our "Medication Rules" document. These exist for your safety and that of others. They are not flexible and neither are we. Dismissing or ignoring them is  an act of "non-compliance" that may result in complete and irreversible termination of such medication therapy. For safety reasons, "non-compliance" will not be tolerated. As with the [place] fundamental legal principle of "ignorance of the law is no defense", we will accept no excuses for not having read and knowing the content of documents provided to you by our  practice.  Pharmacy of record:  Definition: This is the pharmacy where your electronic prescriptions will be sent.  We do not endorse any particular pharmacy. It is up to you and your insurance to decide what pharmacy to use.  We do not restrict you in your choice of pharmacy. However, once we write for your prescriptions, we will NOT be re-sending more prescriptions to fix restricted supply problems created by your pharmacy, or your insurance.  The pharmacy listed in the electronic medical record should be the one where you want electronic prescriptions to be sent. If you choose to change pharmacy, simply notify our nursing staff. Changes will be made only during your regular appointments and not over the phone.  Recommendations: Keep all of your pain medications in a safe place, under lock and key, even if you live alone. We will NOT replace lost, stolen, or damaged medication. We do not accept "Police Reports" as proof of medications having been stolen. After you fill your prescription, take 1 week's worth of pills and put them away in a safe place. You should keep a separate, properly labeled bottle for this purpose. The remainder should be kept in the original bottle. Use this as your primary supply, until it runs out. Once it's gone, then you know that you have 1 week's worth of medicine, and it is time to come in for a prescription refill. If you do this correctly, it is unlikely that you will ever run out of medicine. To make sure that the above recommendation works, it is very important that you make sure your medication refill appointments are scheduled at least 1 week before you run out of medicine. To do this in an effective manner, make sure that you do not leave the office without scheduling your next medication management appointment. Always ask the nursing staff to show you in your prescription , when your medication will be running out. Then arrange for the receptionist to get you a  return appointment, at least 7 days before you run out of medicine. Do not wait until you have 1 or 2 pills left, to come in. This is very poor planning and does not take into consideration that we may need to cancel appointments due to bad weather, sickness, or emergencies affecting our staff. DO NOT ACCEPT A "Partial Fill": If for any reason your pharmacy does not have enough pills/tablets to completely fill or refill your prescription, do not allow for a "partial fill". The law allows the pharmacy to complete that prescription within 72 hours, without requiring a new prescription. If they do not fill the rest of your prescription within those 72 hours, you will need a separate prescription to fill the remaining amount, which we will NOT provide. If the reason for the partial fill is your insurance, you will need to talk to the pharmacist about payment alternatives for the remaining tablets, but again, DO NOT ACCEPT A PARTIAL FILL, unless you can trust your pharmacist to obtain the remainder of the pills within 72 hours.  Prescription refills and/or changes in medication(s):  Prescription refills, and/or changes in dose or medication, will be conducted  only during scheduled medication management appointments. (Applies to both, written and electronic prescriptions.) No refills on procedure days. No medication will be changed or started on procedure days. No changes, adjustments, and/or refills will be conducted on a procedure day. Doing so will interfere with the diagnostic portion of the procedure. No phone refills. No medications will be "called into the pharmacy". No Fax refills. No weekend refills. No Holliday refills. No after hours refills.  Remember:  Business hours are:  Monday to Thursday 8:00 AM to 4:00 PM Provider's Schedule: Milinda Pointer, MD - Appointments are:  Medication management: Monday and Wednesday 8:00 AM to 4:00 PM Procedure day: Tuesday and Thursday 7:30 AM to 4:00  PM Gillis Santa, MD - Appointments are:  Medication management: Tuesday and Thursday 8:00 AM to 4:00 PM Procedure day: Monday and Wednesday 7:30 AM to 4:00 PM (Last update: 11/29/2021) ______________________________________________________________________    ____________________________________________________________________________________________  Drug Holidays  What is a "Drug Holiday"? Drug Holiday: is the name given to the process of slowly tapering down and temporarily stopping the pain medication for the purpose of decreasing or eliminating tolerance to the drug.  Benefits Improved effectiveness Decreased required effective dose Improved pain control End dependence on high dose therapy Decrease cost of therapy Uncovering "opioid-induced hyperalgesia". (OIH)  What is "opioid hyperalgesia"? It is a paradoxical increase in pain caused by exposure to opioids. Stopping the opioid pain medication, contrary to the expected, it actually decreases or completely eliminates the pain. Ref.: "A comprehensive review of opioid-induced hyperalgesia". Brion Aliment, et.al. Pain Physician. 2011 Mar-Apr;14(2):145-61.  What is tolerance? Tolerance: the progressive loss of effectiveness of a pain medicine due to repetitive use. A common problem of opioid pain medications.  How long should a "Drug Holiday" last? Effectiveness depends on the patient staying off all opioid pain medicines for a minimum of 14 consecutive days. (2 weeks)  How about just taking less of the medicine? Does not work. Will not accomplish goal of eliminating the excess receptors.  How about switching to a different pain medicine? (AKA. "Opioid rotation") Does not work. Creates the illusion of effectiveness by taking advantage of inaccurate equivalent dose calculations between different opioids. -This "technique" was promoted by studies funded by [employer], such as [employer], creators of  "OxyContin".  Can I stop the medicine "cold [place]"? Depends. You should always coordinate with your Pain Specialist to make the transition as smoothly as possible. Avoid stopping the medicine abruptly without consulting. We recommend a "slow taper".  What is a slow taper? Taper: refers to the gradual decrease in dose.   How do I stop/taper the dose? Slowly. Decrease the daily amount of pills that you take by one (1) pill every seven (7) days. This is called a "slow downward taper". Example: if you normally take four (4) pills per day, drop it to three (3) pills per day for seven (7) days, then to two (2) pills per day for seven (7) days, then to one (1) per day for seven (7) days, and then stop the medicine. The 14 day "Drug Holiday" starts on the first day without medicine.   Will I experience withdrawals? Unlikely with a slow taper.  What triggers withdrawals? Withdrawals are triggered by the sudden/abrupt stop of high dose opioids. Withdrawals can be avoided by slowly decreasing the dose over a prolonged period of time.  What are withdrawals? Symptoms associated with sudden/abrupt reduction/stopping of high-dose, long-term use of pain medication. Withdrawal are seldom seen on low dose therapy, or  patients rarely taking opioid medication.  Early Withdrawal Symptoms may include: Agitation Anxiety Muscle aches Increased tearing Insomnia Runny nose Sweating Yawning  Late symptoms may include: Abdominal cramping Diarrhea Dilated pupils Goose bumps Nausea Vomiting  (Last update: 01/15/2022) ____________________________________________________________________________________________    ____________________________________________________________________________________________  WARNING: CBD (cannabidiol) & Delta (Delta-8 tetrahydrocannabinol) products.   Applicable to:  All individuals currently taking or considering taking CBD (cannabidiol) and, more important, all  patients taking opioid analgesic controlled substances (pain medication). (Example: oxycodone; oxymorphone; hydrocodone; hydromorphone; morphine; methadone; tramadol; tapentadol; fentanyl; buprenorphine; butorphanol; dextromethorphan; meperidine; codeine; etc.)  Introduction:  Recently there has been a drive towards the use of "natural" products for the treatment of different conditions, including pain anxiety and sleep disorders. Marijuana and hemp are two varieties of the cannabis genus plants. Marijuana and its derivatives are illegal, while hemp and its derivatives are not. Cannabidiol (CBD) and tetrahydrocannabinol (THC), are two natural compounds found in plants of the Cannabis genus. They can both be extracted from hemp or marijuana. Both compounds interact with your body's endocannabinoid system in very different ways. CBD is associated with pain relief (analgesia) while THC is associated with the psychoactive effects ("the high") obtained from the use of marijuana products. There are two main types of THC: Delta-9, which comes from the marijuana plant and it is illegal, and Delta-8, which comes from the hemp plant, and it is legal. (Both, Delta-9-THC and Delta-8-THC are psychoactive and give you "the high".)   Legality:  Marijuana and its derivatives: illegal Hemp and its derivatives: Legal (State dependent) UPDATE: (03/25/2021) The Drug Enforcement Agency ([place]) issued a letter stating that "delta" cannabinoids, including Delta-8-THCO and Delta-9-THCO, synthetically derived from hemp do not qualify as hemp and will be viewed as Schedule I drugs. (Schedule I drugs, substances, or chemicals are defined as drugs with no currently accepted medical use and a high potential for abuse. Some examples of Schedule I drugs are: heroin, lysergic acid diethylamide (LSD), marijuana (cannabis), 3,4-methylenedioxymethamphetamine (ecstasy), methaqualone, and peyote.) (https://jennings.com/)  Legal status of CBD in  [place]:  "Conditionally Legal"  Reference: "FDA Regulation of Cannabis and Cannabis-Derived Products, Including Cannabidiol (CBD)" - SeekArt[employer].com.pt  Warning:  CBD is not FDA approved and has not undergo the same manufacturing controls as prescription drugs.  This means that the purity and safety of available CBD may be questionable. Most of the time, despite manufacturer's claims, it is contaminated with THC (delta-9-tetrahydrocannabinol - the chemical in marijuana responsible for the "HIGH").  When this is the case, the [facility] contaminant will trigger a positive urine drug screen (UDS) test for Marijuana (carboxy-THC).   The FDA recently put out a warning about 5 things that everyone should be aware of regarding Delta-8 THC: Delta-8 THC products have not been evaluated or approved by the FDA for safe use and may be marketed in ways that put the public health at risk. The FDA has received adverse event reports involving delta-8 THC-containing products. Delta-8 THC has psychoactive and intoxicating effects. Delta-8 THC manufacturing often involve use of potentially harmful chemicals to create the concentrations of delta-8 THC claimed in the marketplace. The final delta-8 THC product may have potentially harmful by-products (contaminants) due to the chemicals used in the process. Manufacturing of delta-8 THC products may occur in uncontrolled or unsanitary settings, which may lead to the presence of unsafe contaminants or other potentially harmful substances. Delta-8 THC products should be kept out of the reach of children and pets.  NOTE: Because a positive UDS for any illicit  substance is a violation of our medication agreement, your opioid analgesics (pain medicine) may be permanently discontinued.  MORE ABOUT CBD  General Information: CBD was discovered in [birth year] and it is a derivative of  the cannabis sativa genus plants (Marijuana and Hemp). It is one of the 113 identified substances found in Marijuana. It accounts for up to 40% of the plant's extract. As of 2018, preliminary clinical studies on CBD included research for the treatment of anxiety, movement disorders, and pain. CBD is available and consumed in multiple forms, including inhalation of smoke or vapor, as an aerosol spray, and by mouth. It may be supplied as an oil containing CBD, capsules, dried cannabis, or as a liquid solution. CBD is thought not to be as psychoactive as THC (delta-9-tetrahydrocannabinol - the chemical in marijuana responsible for the "HIGH"). Studies suggest that CBD may interact with different biological target receptors in the body, including cannabinoid and other neurotransmitter receptors. As of 2018 the mechanism of action for its biological effects has not been determined.  Side-effects  Adverse reactions: Dry mouth, diarrhea, decreased appetite, fatigue, drowsiness, malaise, weakness, sleep disturbances, and others.  Drug interactions:  CBD may interact with medications such as blood-thinners. CBD causes drowsiness on its own and it will increase drowsiness caused by other medications, including antihistamines (such as Benadryl), benzodiazepines (Xanax, Ativan, Valium), antipsychotics, antidepressants, opioids, alcohol and supplements such as kava, melatonin and St. John's Wort.  Other drug interactions: Brivaracetam (Briviact); Caffeine; Carbamazepine (Tegretol); Citalopram (Celexa); Clobazam (Onfi); Eslicarbazepine (Aptiom); Everolimus (Zostress); Lithium; Methadone (Dolophine); Rufinamide (Banzel); Sedative medications (CNS depressants); Sirolimus (Rapamune); Stiripentol (Diacomit); Tacrolimus (Prograf); Tamoxifen ; Soltamox); Topiramate (Topamax); Valproate; Warfarin (Coumadin); Zonisamide. (Last update:  01/16/2022) ____________________________________________________________________________________________   ____________________________________________________________________________________________  Naloxone Nasal Spray  Why am I receiving this medication? [place] STOP ACT requires that all patients taking high dose opioids or at risk of opioids respiratory depression, be prescribed an opioid reversal agent, such as Naloxone (AKA: Narcan).  What is this medication? NALOXONE (nal OX one) treats opioid overdose, which causes slow or shallow breathing, severe drowsiness, or trouble staying awake. Call emergency services after using this medication. You may need additional treatment. Naloxone works by reversing the effects of opioids. It belongs to a group of medications called opioid blockers.  COMMON BRAND NAME(S): Kloxxado, Narcan  What should I tell my care team before I take this medication? They need to know if you have any of these conditions: Heart disease Substance use disorder An unusual or allergic reaction to naloxone, other medications, foods, dyes, or preservatives Pregnant or trying to get pregnant Breast-feeding  When to use this medication? This medication is to be used for the treatment of respiratory depression (less than 8 breaths per minute) secondary to opioid overdose.   How to use this medication? This medication is for use in the nose. Lay the person on their back. Support their neck with your hand and allow the head to tilt back before giving the medication. The nasal spray should be given into 1 nostril. After giving the medication, move the person onto their side. Do not remove or test the nasal spray until ready to use. Get emergency medical help right away after giving the first dose of this medication, even if the person wakes up. You should be familiar with how to recognize the signs and symptoms of a narcotic overdose. If more doses are needed, give  the additional dose in the other nostril. Talk to your care team about the use of  this medication in children. While this medication may be prescribed for children as young as newborns for selected conditions, precautions do apply.  Naloxone Overdosage: If you think you have taken too much of this medicine contact a poison control center or emergency room at once.  NOTE: This medicine is only for you. Do not share this medicine with others.  What if I miss a dose? This does not apply.  What may interact with this medication? This is only used during an emergency. No interactions are expected during emergency use. This list may not describe all possible interactions. Give your health care provider a list of all the medicines, herbs, non-prescription drugs, or dietary supplements you use. Also tell them if you smoke, drink alcohol, or use illegal drugs. Some items may interact with your medicine.  What should I watch for while using this medication? Keep this medication ready for use in the case of an opioid overdose. Make sure that you have the phone number of your care team and local hospital ready. You may need to have additional doses of this medication. Each nasal spray contains a single dose. Some emergencies may require additional doses. After use, bring the treated person to the nearest hospital or call 911. Make sure the treating care team knows that the person has received a dose of this medication. You will receive additional instructions on what to do during and after use of this medication before an emergency occurs.  What side effects may I notice from receiving this medication? Side effects that you should report to your care team as soon as possible: Allergic reactions--skin rash, itching, hives, swelling of the face, lips, tongue, or throat Side effects that usually do not require medical attention (report these to your care team if they continue or are  bothersome): Constipation Dryness or irritation inside the nose Headache Increase in blood pressure Muscle spasms Stuffy nose Toothache This list may not describe all possible side effects. Call your doctor for medical advice about side effects. You may report side effects to FDA at 1-800-FDA-1088.  Where should I keep my medication? Because this is an emergency medication, you should keep it with you at all times.  Keep out of the reach of children and pets. Store between 20 and 25 degrees C (68 and 77 degrees F). Do not freeze. Throw away any unused medication after the expiration date. Keep in original box until ready to use.  NOTE: This sheet is a summary. It may not cover all possible information. If you have questions about this medicine, talk to your doctor, pharmacist, or health care provider.   2023 Elsevier/Gold Standard (2020-10-01 00:00:00)  ____________________________________________________________________________________________   ____________________________________________________________________________________________  Patient Information update  To: All of our patients.  Re: Name change.  It has been made official that our current name, "[facility]"   will soon be changed to "[facility]".   The purpose of this change is to eliminate any confusion created by the concept of our practice being a "Medication Management Pain Clinic". In the past this has led to the misconception that we treat pain primarily by the use of prescription medications.  Nothing can be farther from the truth.   Understanding PAIN MANAGEMENT: To further understand what our practice does, you first have to understand that "Pain Management" is a subspecialty that requires additional training once a physician has completed their specialty training, which can be in  either Anesthesia,  Neurology, Psychiatry, or Physical Medicine and Rehabilitation (PMR). Each one of these contributes to the final approach taken by each physician to the management of their patient's pain. To be a "Pain Management Specialist" you must have first completed one of the specialty trainings below.  Anesthesiologists - trained in clinical pharmacology and interventional techniques such as nerve blockade and regional as well as central neuroanatomy. They are trained to block pain before, during, and after surgical interventions.  Neurologists - trained in the diagnosis and pharmacological treatment of complex neurological conditions, such as Multiple Sclerosis, Parkinson's, spinal cord injuries, and other systemic conditions that may be associated with symptoms that may include but are not limited to pain. They tend to rely primarily on the treatment of chronic pain using prescription medications.  Psychiatrist - trained in conditions affecting the psychosocial wellbeing of patients including but not limited to depression, anxiety, schizophrenia, personality disorders, addiction, and other substance use disorders that may be associated with chronic pain. They tend to rely primarily on the treatment of chronic pain using prescription medications.   Physical Medicine and Rehabilitation (PMR) physicians, also known as physiatrists - trained to treat a wide variety of medical conditions affecting the brain, spinal cord, nerves, bones, joints, ligaments, muscles, and tendons. Their training is primarily aimed at treating patients that have suffered injuries that have caused severe physical impairment. Their training is primarily aimed at the physical therapy and rehabilitation of those patients. They may also work alongside orthopedic surgeons or neurosurgeons using their expertise in assisting surgical patients to recover after their surgeries.  INTERVENTIONAL PAIN MANAGEMENT is sub-subspecialty of Pain Management.   Our physicians are Board-certified in Anesthesia, Pain Management, and Interventional Pain Management.  This meaning that not only have they been trained and Board-certified in their specialty of Anesthesia, and subspecialty of Pain Management, but they have also received further training in the sub-subspecialty of Interventional Pain Management, in order to become Board-certified as INTERVENTIONAL PAIN MANAGEMENT SPECIALIST.    Mission: Our goal is to use our skills in  [place] as alternatives to the chronic use of prescription opioid medications for the treatment of pain. To make this more clear, we have changed our name to reflect what we do and offer. We will continue to offer medication management assessment and recommendations, but we will not be taking over any patient's medication management.  ____________________________________________________________________________________________

## 2022-03-06 NOTE — Progress Notes (Signed)
Safety precautions to be maintained throughout the outpatient stay will include: orient to surroundings, keep bed in low position, maintain call bell within reach at all times, provide assistance with transfer out of bed and ambulation.   Nursing Pain Medication Assessment:  Safety precautions to be maintained throughout the outpatient stay will include: orient to surroundings, keep bed in low position, maintain call bell within reach at all times, provide assistance with transfer out of bed and ambulation.  Medication Inspection Compliance: Pill count conducted under aseptic conditions, in front of the patient. Neither the pills nor the bottle was removed from the patient's sight at any time. Once count was completed pills were immediately returned to the patient in their original bottle.  Medication: Oxycodone IR Pill/Patch Count:  22 of 120 pills remain Pill/Patch Appearance: Markings consistent with prescribed medication Bottle Appearance: Standard pharmacy container. Clearly labeled. Filled Date: 01 / 04 / 2024 Last Medication intake:  Today

## 2022-03-07 ENCOUNTER — Ambulatory Visit (INDEPENDENT_AMBULATORY_CARE_PROVIDER_SITE_OTHER): Payer: Worker's Compensation | Admitting: Licensed Clinical Social Worker

## 2022-03-07 ENCOUNTER — Telehealth: Payer: Self-pay | Admitting: Licensed Clinical Social Worker

## 2022-03-07 DIAGNOSIS — F331 Major depressive disorder, recurrent, moderate: Secondary | ICD-10-CM

## 2022-03-07 DIAGNOSIS — R419 Unspecified symptoms and signs involving cognitive functions and awareness: Secondary | ICD-10-CM

## 2022-03-07 DIAGNOSIS — K219 Gastro-esophageal reflux disease without esophagitis: Secondary | ICD-10-CM | POA: Diagnosis not present

## 2022-03-07 DIAGNOSIS — R69 Illness, unspecified: Secondary | ICD-10-CM | POA: Diagnosis not present

## 2022-03-07 DIAGNOSIS — E785 Hyperlipidemia, unspecified: Secondary | ICD-10-CM | POA: Diagnosis not present

## 2022-03-07 DIAGNOSIS — I10 Essential (primary) hypertension: Secondary | ICD-10-CM | POA: Diagnosis not present

## 2022-03-07 NOTE — Progress Notes (Signed)
THERAPIST PROGRESS NOTE  Session Time: [id number]   Participation Level: Active  Behavioral Response: NeatAlertDepressed  Type of Therapy: Individual Therapy  Treatment Goals addressed:  Depression: Reduce overall frequency, intensity and duration of depression so that daily functioning is not impaired per pt self report 3 out of 5 sessions documented.    Work with [name]" to identify the major components of a recent episode of depression: physical symptoms, major thoughts and images, and major behaviors they experienced.    ProgressTowards Goals: Progressing  PHQ-9 score on 02/17/2022 was a 16 and PHQ-9 score on 03/07/2022 was a 16.   Interventions: CBT and Supportive  Pt presented in person at [facility] office. Pt and LCSW were present during the visit.    Summary: Chase Mendez is a 88 y.o. male who presents with continuing symptoms of anxiety and depression.   Pt stated that she has been feeling depressed and that his wife is in the hospital and that has him upset. Pt stated that he has been trying to think of his positive experiences that he has from his past and daily. Pt stated that he is grateful for his wife and his family.   Pt was able to explore in session things that bring him joy and was able to explore coping skills that he uses. Pt stated that his eye sight causes him distress and that he is going to be going to get an exam for his eyes next month and stated that he is looking forward to that because he does not like having to have his wife help him so much because he can not see. Pt stated that he enjoys spending time with his cat and that he enjoys watching his cat.    Allowed pt to explore thoughts and feelings associated with life situations and external stressors. Encouraged expression of feelings and used empathic listening. Pt appeared to be oriented to place and situation. LCSW validated the pts feelings and thoughts and  showed unconditional positive regard. Pt reported that he has been having difficulties with memory. Pt was aware of the time, place and was able to recall his last appointment with his psychiatrist and was able to recall our last session and what we dicussed. Pt stated that he has been truing to continue to stay positive and that some days he is really down.    Pt was able to explore how his thoughts impact how he feels. Pt stated that he finds himself often thinking about his past and what he wanted to do then and what he can not do now. Pt stated that he wife is supportive and that he enjoys her help and companionship. Pt was able to explore his thoughts and values and what is important to him in session and was able to explore his emotions and how he feels. Pt stated that he values his wife and his accomplishments and was able to explore what he has accomplished.   Pt denies SI/HI or A/V hallucinations.Pt was cooperative during visit and was engaged throughout the visit. Pt does not report any other concerns at the time of visit.   Encouraged pt to take medications as prescribed by their psychiatrist.    Suicidal/Homicidal: Nowithout intent/plan  Therapist Response: Educated on acceptance and commitment therapy and explored how skills can be taught to accept painful and unwanted emotions instead of avoiding them. Encouraged pt to be present in the here and now and explored with the pt  what is really important to them. Discussed with the pt the importance of discovering their values and their commitment to action to purse the important things in their life. Validated the pts feelings and thoughts about the values and what is important to them. Engaged the client to explore what acceptance means to them and explored the pts willingness to experience difficult thoughts and how ready they are to accept and acknowledge those difficult thoughts.    LCSW used CBT techniques to explore how thoughts,feelings  and beliefs impact behavioral responses and physical symptoms. Explored how stressors can lead to negative thoughts that can be irrational or exaggerated which in return impacts how the person feels and can negatively impact how their body responds to include fatigue, sleep problems, poor concentration and loss of motivation. Explored how a behavioral response can create new stressors. Engaged with the pt to explore how their thoughts and feelings impact their behaviors and choices.    Explored with pt how their beliefs to include what they believe about themselves, about something specific and about what's most important to them impacts their thoughts, feelings and emotions and actions and behavior. Explored the power of self-talk and how often times what a person feels is what they are saying to themselves. Discussed with the pt how a situation or experience that they encounter and how they think about the situation will likely influence how they feel and respond and if that they have negative-self talk then the emotional response will more than likely be negative. Explored with the pt that the positive aspect of self-talk is them being able to make choice. Discussed with the pt that they have the choice to interpret the situation and think about the situation in a certain manner. Empowered the pt to take responsibility for their reactions and to challenge negative thoughts to replace those thoughts with positive ones. Provided examples of positive self-talk to include: this shall pass and my life will be better, I am doing the best I can, given my history and level of current awareness, Be honest and true to yourself, I am a worthy and good person, Look how my I have accomplished and the progression I have made and there are no failures only different degrees of success. Explored in session with the pt how to utilize self-talk that is realistic and uplifting and to become aware when negative self-talk is  present and to challenge those negative thoughts.     Continued Recommendations as followed: Self-care behaviors, positive social engagements, focusing on positive physical and emotional wellness, and focusing on life/work balance.    Plan: Discussed with the pt the transition of a new therapist and provided the pt with the choice of continuing services with the new therapist and answered any questions that the pt had about the transition.  Pt agreed to the transition and stated that he was ok with the new therapist. Dicussed with the pt that this would be our last therapy visit.   Diagnosis:  Encounter Diagnoses  Name Primary?   MDD (major depressive disorder), recurrent episode, moderate ([place]) Yes   Neurocognitive disorder       Collaboration of Care: Pt encouraged to continue care with psychiatrist of record Dr. Modesta Messing.     Patient/Guardian was advised Release of Information must be obtained prior to any record release in order to collaborate their care with an outside provider. Patient/Guardian was advised if they have not already done so to contact the registration department to sign all  necessary forms in order for [place] to release information regarding their care.   Consent: Patient/Guardian gives verbal consent for treatment and assignment of benefits for services provided during this visit. Patient/Guardian expressed understanding and agreed to proceed.   Lorenda Hatchet 03/07/2022

## 2022-03-07 NOTE — Telephone Encounter (Signed)
Left a voicemail requesting a return call. When he contacts [place], please inquire for additional details regarding concerns or questions. Thanks!

## 2022-03-07 NOTE — Telephone Encounter (Signed)
Pt stated that he wanted me to reach out because he had a concern about his medication Cymbalta and stated that his legs are weak and he has questions about the medication at this time. Thank you.

## 2022-03-09 ENCOUNTER — Ambulatory Visit (INDEPENDENT_AMBULATORY_CARE_PROVIDER_SITE_OTHER): Payer: Worker's Compensation | Admitting: Psychiatry

## 2022-03-09 ENCOUNTER — Encounter: Payer: Self-pay | Admitting: Psychiatry

## 2022-03-09 VITALS — BP 96/60 | HR 71 | Temp 98.5°F | Ht 72.0 in | Wt 193.0 lb

## 2022-03-09 DIAGNOSIS — R419 Unspecified symptoms and signs involving cognitive functions and awareness: Secondary | ICD-10-CM

## 2022-03-09 DIAGNOSIS — F331 Major depressive disorder, recurrent, moderate: Secondary | ICD-10-CM | POA: Diagnosis not present

## 2022-03-09 DIAGNOSIS — R69 Illness, unspecified: Secondary | ICD-10-CM | POA: Diagnosis not present

## 2022-03-09 MED ORDER — SERTRALINE HCL 25 MG PO TABS: 25.0000 mg | ORAL_TABLET | Freq: Every day | ORAL | 1 refills | Status: DC

## 2022-03-09 NOTE — Telephone Encounter (Signed)
spoke with patient he states he just can not take the medication he states that he just feels weak and that his leg feel like jello, he states that he also has a sore on his mouth. he just doesn't want to take it.

## 2022-03-09 NOTE — Telephone Encounter (Signed)
He had a visit with me earlier, and was already advised to taper off this medication.

## 2022-03-09 NOTE — Patient Instructions (Signed)
Decrease duloxetine 20 mg daily for one week, then discontinue Start sertraline 25 mg at night Next appointment: 4/2 at 2:30

## 2022-03-09 NOTE — Progress Notes (Signed)
BH MD/PA/NP OP Progress Note  03/09/2022 12:18 PM Chase Mendez  MRN:  [id number]  Chief Complaint:  Chief Complaint  Patient presents with   Follow-up   HPI:  This is a follow-up appointment for depression and neurocognitive impairment.  He states that he is not doing well.  He feels weak.  He also thinks he has some yeast infection in his mouth.  He was unstable on his foot, and fell a few times.  He has an upcoming appointment with his PCP.  He also states that he is concerned as he has limited vision.  He is unable to comb his hair.  He then talks about the time he was in [place] (20 Mendez ago) for "drug holiday."  After redirection, he thinks higher dose of duloxetine is causing more issues, and he does not want to stay on Cymbalta anymore.  He denies SI.  He denies hallucinations.   His step Chase Mendez Chase Mendez presents to the visit.  She stays with them since 2019.  She has not noticed any difference in him except he complains of leg weakness. He wishes him to get stronger.  He does eat better for the past month.  Functional Status Instrumental Activities of Daily Living (IADLs):  Chase Mendez is independent in the following:  Requires assistance with the following: driving, medications (his wife arranges in pill box), managing finances,   Activities of Daily Living (ADLs):  Chase Mendez is independent in the following: bathing (uses shower chair), and hygiene, feeding, continence, grooming and toileting, walking (walker)     Household: wife Chase Mendez, second marriage Number of children: 2 (Chase Mendez, estranged relationship with his son) Employment: Chase (worked for school system),  Education:  Chase Mendez Last PCP / ongoing medical evaluation:   Visit Diagnosis:    ICD-10-CM   1. MDD (major depressive disorder), recurrent episode, moderate (HCC)  F33.1     2. Neurocognitive disorder  R41.9       Past Psychiatric History: Please  see initial evaluation for full details. I have reviewed the history. No updates at this time.     Past Medical History:  Past Medical History:  Diagnosis Date   Acute encephalopathy 12/08/2014   Anxiety    ARF (acute renal failure) ([place]) 12/08/2014   Back pain    Benign neoplasm of large bowel    Capsulitis    fractured displaced metatarsal with capsulitis   Chronic back pain    Coronavirus infection    Depression    Diabetes mellitus without complication (HCC)    no medications currently   Dysphagia    Exostosis    painful, right hallux   Foot drop, left    wears a brace   Frequent falls 02/2018   poor balance, foot drop   GERD (gastroesophageal reflux disease)    Gout    Hypertension    Hypothyroidism    Insomnia    Low testosterone    Microscopic hematuria 2016   Myocardial infarction [facility])    patient unaware when it happened Mendez ago.     Pneumonia 12/07/2014   Pressure ulcer 12/09/2014   Sepsis ([place]) 12/08/2014   Thyroid disease     Past Surgical History:  Procedure Laterality Date   [place]   x 2   CHOLECYSTECTOMY     ESOPHAGOGASTRODUODENOSCOPY (EGD) WITH PROPOFOL N/A 04/24/2017   Procedure: ESOPHAGOGASTRODUODENOSCOPY (EGD) WITH  PROPOFOL;  Surgeon: Manya Silvas, MD;  Location: [facility] ENDOSCOPY;  Service: Endoscopy;  Laterality: N/A;   EYE SURGERY Bilateral 1983, 1985   cataract extractions   SPINAL CORD STIMULATOR INSERTION N/A 03/13/2018   Procedure: SPINAL CORD STIMULATOR INSERTION;  Surgeon: Meade Maw, MD;  Location: [facility] ORS;  Service: Neurosurgery;  Laterality: N/A;    Family Psychiatric History: Please see initial evaluation for full details. I have reviewed the history. No updates at this time.     Family History:  Family History  Problem Relation Age of Onset   Heart disease Mother    Diabetes Father    Kidney cancer Brother     Social History:  Social History   Socioeconomic History   Chase  status: Married    Spouse name: Chase Mendez   Number of children: 2   Mendez of education: Not on file   Highest education level: Some Mendez, no degree  Occupational History   Occupation: maintenance / repair    Comment: disabled  Tobacco Use   Smoking status: Every Day    Packs/day: 0.50    Mendez: 70.00    Total pack Mendez: 35.00    Types: Cigars, Cigarettes   Smokeless tobacco: Never   Tobacco comments:    unable to give cessation materials due to webex visit   Vaping Use   Vaping Use: Never used  Substance and Sexual Activity   Alcohol use: No   Drug use: Yes    Types: Oxycodone   Sexual activity: Not Currently  Other Topics Concern   Not on file  Social History Narrative   Not on file   Social Determinants of Health   Financial Resource Strain: Not on file  Food Insecurity: Not on file  Transportation Needs: Not on file  Physical Activity: Not on file  Stress: Not on file  Social Connections: Not on file    Allergies:  Allergies  Allergen Reactions   Doxycycline    Ibuprofen Nausea Only   Sulfa Antibiotics Nausea Only    Metabolic Disorder Labs: Lab Results  Component Value Date   HGBA1C 6.3 (H) 12/08/2014   No results found for: "PROLACTIN" Lab Results  Component Value Date   CHOL 121 12/08/2014   TRIG 115 12/08/2014   HDL 40 (L) 12/08/2014   CHOLHDL 3.0 12/08/2014   VLDL 23 12/08/2014   LDLCALC 58 12/08/2014   Lab Results  Component Value Date   TSH 2.963 02/14/2022   TSH 1.425 01/27/2019    Therapeutic Level Labs: No results found for: "LITHIUM" No results found for: "VALPROATE" No results found for: "CBMZ"  Current Medications: Current Outpatient Medications  Medication Sig Dispense Refill   allopurinol (ZYLOPRIM) 100 MG tablet Take 100 mg by mouth daily.  0   amLODipine (NORVASC) 10 MG tablet Take 10 mg by mouth daily.     aspirin 81 MG tablet Take 81 mg by mouth daily.     atorvastatin (LIPITOR) 10 MG tablet Take 10 mg by mouth at  bedtime.     DULoxetine (CYMBALTA) 20 MG capsule Take 2 capsules (40 mg total) by mouth at bedtime. 60 capsule 1   fluticasone (FLONASE) 50 MCG/ACT nasal spray Place into both nostrils.     gabapentin (NEURONTIN) 600 MG tablet Take by mouth.     isosorbide mononitrate (IMDUR) 30 MG 24 hr tablet Take 30 mg by mouth daily.     levothyroxine (SYNTHROID, LEVOTHROID) 125 MCG tablet Take 125 mcg by mouth daily before  breakfast.  0   losartan (COZAAR) 100 MG tablet Take 100 mg by mouth daily.      naloxone (NARCAN) nasal spray 4 mg/0.1 mL Place 1 spray into the nose as needed for up to 365 doses (for opioid-induced respiratory depresssion). In case of emergency (overdose), spray once into each nostril. If no response within 3 minutes, repeat application and call 539. 1 each 0   ondansetron (ZOFRAN ODT) 4 MG disintegrating tablet Take 1 tablet (4 mg total) by mouth every 8 (eight) hours as needed for nausea or vomiting. 20 tablet 0   [START ON 03/11/2022] Oxycodone HCl 10 MG TABS Take 1 tablet (10 mg total) by mouth every 6 (six) hours as needed. Must last 30 days. 120 tablet 0   [START ON 04/10/2022] Oxycodone HCl 10 MG TABS Take 1 tablet (10 mg total) by mouth every 6 (six) hours as needed. Must last 30 days. 120 tablet 0   [START ON 05/10/2022] Oxycodone HCl 10 MG TABS Take 1 tablet (10 mg total) by mouth every 6 (six) hours as needed. Must last 30 days. 120 tablet 0   sertraline (ZOLOFT) 25 MG tablet Take 1 tablet (25 mg total) by mouth at bedtime. 30 tablet 1   tamsulosin (FLOMAX) 0.4 MG CAPS capsule Take 0.4 mg by mouth daily.     Testosterone 40.5 MG/2.5GM (1.62%) GEL Place 2 Pump onto the skin 3 (three) times a week.     vitamin B-12 (CYANOCOBALAMIN) 1000 MCG tablet Take 1,000 mcg by mouth daily.     vitamin E 400 UNIT capsule Take 400 Units by mouth daily.     pantoprazole (PROTONIX) 40 MG tablet Take 40 mg by mouth daily.      No current facility-administered medications for this visit.      Musculoskeletal: Strength & Muscle Tone: within normal limits Gait & Station:  N/A (on wheel chair) Patient leans: N/A  Psychiatric Specialty Exam: Review of Systems  Psychiatric/Behavioral:  Positive for decreased concentration and dysphoric mood. Negative for agitation, behavioral problems, confusion, hallucinations, self-injury, sleep disturbance and suicidal ideas. The patient is nervous/anxious. The patient is not hyperactive.   All other systems reviewed and are negative.   Blood pressure 96/60, pulse 71, temperature 98.5 F (36.9 C), height 6' (1.829 m), weight 193 lb (87.5 kg), SpO2 93 %.Body mass index is 26.18 kg/m.  General Appearance: Fairly Groomed  Eye Contact:  Good  Speech:  Clear and Coherent  Volume:  Normal  Mood:  Depressed  Affect:  Appropriate, Congruent, and Restricted  Thought Process:  Coherent, irrelevant at times  Orientation:  Full (Time, Place, and Person)  Thought Content: Logical   Suicidal Thoughts:  No  Homicidal Thoughts:  No  Memory:  Immediate;   Fair  Judgement:  Good  Insight:  Present  Psychomotor Activity:  Decreased  Concentration:  Concentration: Fair and Attention Span: Fair  Recall:  [employer] of Knowledge: Good  Language: Good  Akathisia:  No  Handed:  Right  AIMS (if indicated): not done  Assets:  Social Support  ADL's:  Impaired  Cognition: Impaired,  Mild  Sleep:  Fair   Screenings: GAD-7    Personnel officer Visit from 03/09/2022 in [place] [facility] from 02/17/2022 in [place] Office Visit from 02/14/2022 in [place] [facility] from 01/24/2022 in [place] Office Visit from 01/10/2022 in [facility]  Total GAD-7 Score '18 16 12 9 16      '$ PHQ2-9    [place] Office Visit from 03/09/2022 in [place] [facility] from 03/07/2022 in [place] Office Visit from 03/06/2022 in [facility] from 02/17/2022 in [place] Office Visit from 02/14/2022 in [place]  PHQ-2 Total Score '4 4 1 4 4  '$ PHQ-9 Total Score 22 16 -- 16 12      [place] Office Visit from 03/09/2022 in [place] [facility] from 03/07/2022 in [place] [facility] from 02/17/2022 in [place] No Risk No Risk No Risk        Assessment and Plan:  THEODIS KINSEL is a 88 y.o. year old male with a history of chronic low back pain, diabetes, GERD, hypertension, hypothyroidism, MI, who presents for follow up appointment for below.   1. MDD (major depressive disorder), recurrent episode, moderate (HCC) Acute stressors include:  Other stressors include: declining in health, including hearing/visual loss, using a walker, loss of his family members, team members in the [employer]    History:   There has been worsening in depressive symptoms since the last visit.  He reports leg weakness in the setting of uptitration of duloxetine.  Although he was advised to be evaluated by PCP first given AKI on recent lab, he reports preference to discontinue duloxetine.  Will cross taper to sertraline to see if it is more beneficial for his mood symptoms.  Will consider a slow uptitration if he tolerates this medication.   2. Neurocognitive disorder Unchanged. Exam is notable for delayed speech latency, and occasional disorganization, perseveration during MoCA.  He does have cognitive deficits as evidenced by MoCA. Noted that although the score was impacted by decreased  vision and hearing loss and with limited effort, he did demonstrate deficits in some areas (such as writing "1-8" inside the circle, and "12" outside of circle when he was asked to draw a clock).  He does have impairment in IADL.   Plan  Decrease duloxetine 20 mg daily for Chase week, then discontinue Start sertraline 25 mg at night Next appointment: 4/2 at 2:30 for 30 mins, IP   Past trials of medication: risperidone    The patient demonstrates the following risk factors for suicide: Chronic risk factors for suicide include: psychiatric disorder of depression . Acute risk factors for suicide include: unemployment and loss (financial, interpersonal, professional). Protective factors for this patient include: positive social support and hope for the future. Considering these factors, the overall suicide risk at this point appears to be low. Patient is appropriate for outpatient follow up.   Collaboration of Care: Collaboration of Care: Other reviewed notes in Epic  Patient/Guardian was advised Release of Information must be obtained prior to any record release in order to collaborate their care with an outside provider. Patient/Guardian was advised if they have not already done so to contact the registration department to sign all necessary forms in order for [place] to release information regarding their care.   Consent: Patient/Guardian gives verbal consent for treatment and assignment of benefits for services provided during this visit. Patient/Guardian expressed understanding and agreed to proceed.    Norman Clay, MD 03/09/2022, 12:18 PM

## 2022-03-11 ENCOUNTER — Other Ambulatory Visit: Payer: Self-pay | Admitting: Pain Medicine

## 2022-03-11 DIAGNOSIS — Z79899 Other long term (current) drug therapy: Secondary | ICD-10-CM

## 2022-03-11 DIAGNOSIS — G894 Chronic pain syndrome: Secondary | ICD-10-CM

## 2022-03-11 DIAGNOSIS — Z79891 Long term (current) use of opiate analgesic: Secondary | ICD-10-CM

## 2022-03-12 NOTE — Progress Notes (Unsigned)
PROVIDER NOTE: Interpretation of information contained herein should be left to medically-trained personnel. Specific patient instructions are provided elsewhere under "Patient Instructions" section of medical record. This document was created in part using STT-dictation technology, any transcriptional errors that may result from this process are unintentional.  Patient: Chase Mendez Type: Established DOB: March 18, 1934 MRN: [id number] PCP: Jodi Marble, MD   Service: Procedure DOS: 03/16/2022 Setting: Ambulatory Location: Ambulatory outpatient facility Delivery: Face-to-face Provider: Gaspar Cola, MD Specialty: Interventional Pain Management Specialty designation: 09 Location: Outpatient facility Ref. Prov.: Milinda Pointer, MD   Interventional Therapy     Type: Cervical Facet Medial Branch Block(s) #2  Laterality: Bilateral  Level: C3, C4, C5, C6, and C7 Medial Branch Level(s). Injecting these levels blocks the C3-4, C4-5, C5-6, and C6-7 cervical facet joints.  Imaging: Fluoroscopic guidance Anesthesia: Local anesthesia (1-2% Lidocaine) Anxiolysis: IV Versed 3.0 mg Sedation: Moderate Sedation Fentanyl 1 mL (50 mcg) DOS: 03/16/2022  Performed by: Gaspar Cola, MD  Purpose: Diagnostic/Therapeutic Indications: Cervicalgia (cervical spine axial pain) severe enough to impact quality of life or function. 1. Cervicalgia   2. Cervical facet syndrome   3. DDD (degenerative disc disease), cervical   4. Spondylosis without myelopathy or radiculopathy, cervical region   5. Chronic pain syndrome   6. Presence of neurostimulator    NAS-11 Pain score:   Pre-procedure: 6 /10   Post-procedure: 0-No pain/10     Position / Prep / Materials:  Position: Prone. Head in cradle. C-spine slightly flexed. Prep solution: DuraPrep (Iodine Povacrylex [0.7% available iodine] and Isopropyl Alcohol, 74% w/w) Prep Area: Posterior Cervico-thoracic Region. From occipital ridge to tip of  scapula, and from shoulder to shoulder. Entire posterior and lateral neck surface. Materials:  Tray: Block Needle(s):  Type: Spinal  Gauge (G): 22"  Length: 3.5-in  Qty:  5      Pre-op H&P Assessment:  Chase Mendez is a 88 y.o. (year old), male patient, seen today for interventional treatment. He  has a past surgical history that includes Cholecystectomy; Esophagogastroduodenoscopy (egd) with propofol (N/A, 04/24/2017); Back surgery (1995, 1996); Eye surgery (Bilateral, 1983, 1985); Appendectomy; and Spinal cord stimulator insertion (N/A, 03/13/2018). [name] has a current medication list which includes the following prescription(s): allopurinol, amlodipine, aspirin, atorvastatin, fluticasone, gabapentin, isosorbide mononitrate, levothyroxine, losartan, naloxone, ondansetron, oxycodone hcl, [START ON 04/10/2022] oxycodone hcl, [START ON 05/10/2022] oxycodone hcl, sertraline, tamsulosin, testosterone, cyanocobalamin, vitamin e, pantoprazole, and [DISCONTINUED] diphenhydramine, and the following Facility-Administered Medications: fentanyl. His primarily concern today is the Neck Pain  Initial Vital Signs:  Pulse/HCG Rate: 68ECG Heart Rate: 76 (NSR) Temp: (!) 97.5 F (36.4 C) Resp: 14 BP: (!) 136/57 SpO2: 97 %  BMI: Estimated body mass index is 26.45 kg/m as calculated from the following:   Height as of this encounter: 6' (1.829 m).   Weight as of this encounter: 195 lb (88.5 kg).  Risk Assessment: Allergies: Reviewed. He is allergic to doxycycline, ibuprofen, and sulfa antibiotics.  Allergy Precautions: None required Coagulopathies: Reviewed. None identified.  Blood-thinner therapy: None at this time Active Infection(s): Reviewed. None identified. Chase Mendez is afebrile  Site Confirmation: Mr. Bernhard was asked to confirm the procedure and laterality before marking the site Procedure checklist: Completed Consent: Before the procedure and under the influence of no sedative(s), amnesic(s), or  anxiolytics, the patient was informed of the treatment options, risks and possible complications. To fulfill our ethical and legal obligations, as recommended by the American Medical Association's Code of Ethics, I have informed the  patient of my clinical impression; the nature and purpose of the treatment or procedure; the risks, benefits, and possible complications of the intervention; the alternatives, including doing nothing; the risk(s) and benefit(s) of the alternative treatment(s) or procedure(s); and the risk(s) and benefit(s) of doing nothing. The patient was provided information about the general risks and possible complications associated with the procedure. These may include, but are not limited to: failure to achieve desired goals, infection, bleeding, organ or nerve damage, allergic reactions, paralysis, and death. In addition, the patient was informed of those risks and complications associated to Spine-related procedures, such as failure to decrease pain; infection (i.e.: Meningitis, epidural or intraspinal abscess); bleeding (i.e.: epidural hematoma, subarachnoid hemorrhage, or any other type of intraspinal or peri-dural bleeding); organ or nerve damage (i.e.: Any type of peripheral nerve, nerve root, or spinal cord injury) with subsequent damage to sensory, motor, and/or autonomic systems, resulting in permanent pain, numbness, and/or weakness of one or several areas of the body; allergic reactions; (i.e.: anaphylactic reaction); and/or death. Furthermore, the patient was informed of those risks and complications associated with the medications. These include, but are not limited to: allergic reactions (i.e.: anaphylactic or anaphylactoid reaction(s)); adrenal axis suppression; blood sugar elevation that in diabetics may result in ketoacidosis or comma; water retention that in patients with history of congestive heart failure may result in shortness of breath, pulmonary edema, and decompensation  with resultant heart failure; weight gain; swelling or edema; medication-induced neural toxicity; particulate matter embolism and blood vessel occlusion with resultant organ, and/or nervous system infarction; and/or aseptic necrosis of one or more joints. Finally, the patient was informed that Medicine is not an exact science; therefore, there is also the possibility of unforeseen or unpredictable risks and/or possible complications that may result in a catastrophic outcome. The patient indicated having understood very clearly. We have given the patient no guarantees and we have made no promises. Enough time was given to the patient to ask questions, all of which were answered to the patient's satisfaction. Mr. Streeper has indicated that he wanted to continue with the procedure. Attestation: I, the ordering provider, attest that I have discussed with the patient the benefits, risks, side-effects, alternatives, likelihood of achieving goals, and potential problems during recovery for the procedure that I have provided informed consent. Date  Time: 03/16/2022  9:17 AM   Pre-Procedure Preparation:  Monitoring: As per clinic protocol. Respiration, ETCO2, SpO2, BP, heart rate and rhythm monitor placed and checked for adequate function Safety Precautions: Patient was assessed for positional comfort and pressure points before starting the procedure. Time-out: I initiated and conducted the "Time-out" before starting the procedure, as per protocol. The patient was asked to participate by confirming the accuracy of the "Time Out" information. Verification of the correct person, site, and procedure were performed and confirmed by me, the nursing staff, and the patient. "Time-out" conducted as per Joint Commission's Universal Protocol (UP.01.01.01). Time: 1043  Description/Narrative of Procedure:          Laterality: Bilateral. The procedure was performed in identical fashion on both sides. Targeted Levels:  C3, C4,  C5, C6, and C7 Medial Branch Level(s).  Rationale (medical necessity): procedure needed and proper for the diagnosis and/or treatment of the patient's medical symptoms and needs. Procedural Technique Safety Precautions: Aspiration looking for blood return was conducted prior to all injections. At no point did we inject any substances, as a needle was being advanced. No attempts were made at seeking any paresthesias. Safe injection practices  and needle disposal techniques used. Medications properly checked for expiration dates. SDV (single dose vial) medications used. Description of the Procedure: Protocol guidelines were followed. The patient was assisted into a comfortable position. The target area was identified and the area prepped in the usual manner. Skin & deeper tissues infiltrated with local anesthetic. Appropriate amount of time allowed to pass for local anesthetics to take effect. The procedure needles were then advanced to the target area. Proper needle placement secured. Negative aspiration confirmed. Solution injected in intermittent fashion, asking for systemic symptoms every 0.5cc of injectate. The needles were then removed and the area cleansed, making sure to leave some of the prepping solution back to take advantage of its long term bactericidal properties.  Technical description of process:  Third Occipital Nerve (TON) Block (MBB): The target area for the TON branch is the postero-lateral aspect of the C2-C3 articulation. Under fluoroscopic guidance, a Quincke needle was inserted until contact was made with os over the target area. After negative aspiration for blood, 0.5 mL of the nerve block solution was injected without difficulty or complication. The needle was removed intact. C3 Medial Branch Nerve Block (MBB): The target area for the C3 dorsal medial articular branch is the lateral concave waist of the articular pillar of C3. Under fluoroscopic guidance, a Quincke needle was inserted  until contact was made with os over the postero-lateral aspect of the articular pillar of C3 (target area). After negative aspiration for blood, 0.5 mL of the nerve block solution was injected without difficulty or complication. The needle was removed intact. C4 Medial Branch Nerve Block (MBB): The target area for the C4 dorsal medial articular branch is the lateral concave waist of the articular pillar of C4. Under fluoroscopic guidance, a Quincke needle was inserted until contact was made with os over the postero-lateral aspect of the articular pillar of C4 (target area). After negative aspiration for blood, 0.5 mL of the nerve block solution was injected without difficulty or complication. The needle was removed intact. C5 Medial Branch Nerve Block (MBB): The target area for the C5 dorsal medial articular branch is the lateral concave waist of the articular pillar of C5. Under fluoroscopic guidance, a Quincke needle was inserted until contact was made with os over the postero-lateral aspect of the articular pillar of C5 (target area). After negative aspiration for blood, 0.5 mL of the nerve block solution was injected without difficulty or complication. The needle was removed intact. C6 Medial Branch Nerve Block (MBB): The target area for the C6 dorsal medial articular branch is the lateral concave waist of the articular pillar of C6. Under fluoroscopic guidance, a Quincke needle was inserted until contact was made with os over the postero-lateral aspect of the articular pillar of C6 (target area). After negative aspiration for blood, 0.5 mL of the nerve block solution was injected without difficulty or complication. The needle was removed intact. C7 Medial Branch Nerve Block (MBB): The target for the C7 dorsal medial articular branch lies on the superior-medial tip of the C7 transverse process. Under fluoroscopic guidance, a Quincke needle was inserted until contact was made with os over the postero-lateral  aspect of the articular pillar of C7 (target area). After negative aspiration for blood, 0.5 mL of the nerve block solution was injected without difficulty or complication. The needle was removed intact.  Once the entire procedure was completed, the treated area was cleaned, making sure to leave some of the prepping solution back to take advantage of its  long term bactericidal properties.  Anatomy Reference Guide:       Vitals:   03/16/22 1056 03/16/22 1108 03/16/22 1117 03/16/22 1128  BP: 127/69 108/63 (!) 117/53 (!) 118/57  Pulse:      Resp: '20 18 16 16  '$ Temp:  (!) 97.3 F (36.3 C)    TempSrc:  Temporal    SpO2: 100% 95%  98%  Weight:      Height:         Start Time: 1043 hrs. End Time: 1055 hrs.  Imaging Guidance (Spinal):          Type of Imaging Technique: Fluoroscopy Guidance (Spinal) Indication(s): Assistance in needle guidance and placement for procedures requiring needle placement in or near specific anatomical locations not easily accessible without such assistance. Exposure Time: Please see nurses notes. Contrast: None used. Fluoroscopic Guidance: I was personally present during the use of fluoroscopy. "Tunnel Vision Technique" used to obtain the best possible view of the target area. Parallax error corrected before commencing the procedure. "Direction-depth-direction" technique used to introduce the needle under continuous pulsed fluoroscopy. Once target was reached, antero-posterior, oblique, and lateral fluoroscopic projection used confirm needle placement in all planes. Images permanently stored in EMR. Interpretation: No contrast injected. I personally interpreted the imaging intraoperatively. Adequate needle placement confirmed in multiple planes. Permanent images saved into the patient's record.  Post-operative Assessment:  Post-procedure Vital Signs:  Pulse/HCG Rate: 6865 Temp: (!) 97.3 F (36.3 C) Resp: 16 BP: (!) 118/57 SpO2: 98 %  EBL:  None  Complications: No immediate post-treatment complications observed by team, or reported by patient.  Note: The patient tolerated the entire procedure well. A repeat set of vitals were taken after the procedure and the patient was kept under observation following institutional policy, for this type of procedure. Post-procedural neurological assessment was performed, showing return to baseline, prior to discharge. The patient was provided with post-procedure discharge instructions, including a section on how to identify potential problems. Should any problems arise concerning this procedure, the patient was given instructions to immediately contact us, at any time, without hesitation. In any case, we plan to contact the patient by telephone for a follow-up status report regarding this interventional procedure.  Comments:  No additional relevant information.  Plan of Care  Orders:  Orders Placed This Encounter  Procedures   CERVICAL FACET (MEDIAL BRANCH NERVE BLOCK)     Scheduling Instructions:     Side: Bilateral     Level: C3-4, C4-5, C5-6, and C6-7 Facet joints (C3, C4, C5, C6, and C7 Medial Branch Nerves)     Sedation: Patient's choice.     Timeframe: Today    Order Specific Question:   Where will this procedure be performed?    Answer:   [facility]   DG [facility] C-ARM 1-60 MIN NO REPORT    Intraoperative interpretation by procedural physician at [place].    Standing Status:   Standing    Number of Occurrences:   1    Order Specific Question:   Reason for exam:    Answer:   Assistance in needle guidance and placement for procedures requiring needle placement in or near specific anatomical locations not easily accessible without such assistance.   Informed Consent Details: Physician/Practitioner Attestation; Transcribe to consent form and obtain patient signature    Note: Always confirm laterality of pain with Mr. Mortensen, before procedure. Transcribe to  consent form and obtain patient signature.    Order Specific Question:  Physician/Practitioner attestation of informed consent for procedure/surgical case    Answer:   I, the physician/practitioner, attest that I have discussed with the patient the benefits, risks, side effects, alternatives, likelihood of achieving goals and potential problems during recovery for the procedure that I have provided informed consent.    Order Specific Question:   Procedure    Answer:   Bilateral Cervical facet block under fluoroscopic guidance.    Order Specific Question:   Physician/Practitioner performing the procedure    Answer:   Normalee Sistare A. Dossie Arbour, MD    Order Specific Question:   Indication/Reason    Answer:   Chronic neck pain secondary to cervical facet syndrome   Provide equipment / supplies at bedside    Procedure tray: "Block Tray" (Disposable  single use) Skin infiltration needle: Regular 1.5-in, 25-G, (x1) Block Needle type: Spinal Amount/quantity: 5 Size: Regular (3.5-inch) Gauge: 22G    Standing Status:   Standing    Number of Occurrences:   1    Order Specific Question:   Specify    Answer:   Block Tray   Chronic Opioid Analgesic:  Oxycodone IR 10 mg, 1 tab PO q 6 hrs. (40 mg/day of oxycodone) MME/day: 60 mg/day.   Medications ordered for procedure: Meds ordered this encounter  Medications   lidocaine (XYLOCAINE) 2 % (with pres) injection 400 mg   pentafluoroprop-tetrafluoroeth (GEBAUERS) aerosol   lactated ringers infusion   midazolam (VERSED) 5 MG/5ML injection 0.5-2 mg    Make sure Flumazenil is available in the pyxis when using this medication. If oversedation occurs, administer 0.2 mg IV over 15 sec. If after 45 sec no response, administer 0.2 mg again over 1 min; may repeat at 1 min intervals; not to exceed 4 doses (1 mg)   fentaNYL (SUBLIMAZE) injection 25-50 mcg    Make sure Narcan is available in the pyxis when using this medication. In the event of respiratory depression  (RR< 8/min): Titrate NARCAN (naloxone) in increments of 0.1 to 0.2 mg IV at 2-3 minute intervals, until desired degree of reversal.   ropivacaine (PF) 2 mg/mL (0.2%) (NAROPIN) injection 18 mL   dexamethasone (DECADRON) injection 20 mg   Medications administered: We administered lidocaine, pentafluoroprop-tetrafluoroeth, lactated ringers, midazolam, fentaNYL, ropivacaine (PF) 2 mg/mL (0.2%), and dexamethasone.  See the medical record for exact dosing, route, and time of administration.  Follow-up plan:   Return in about 2 weeks (around 03/30/2022) for Proc-day (T,Th), (VV), (PPE).        Interventional Therapies  Risk  Complexity Considerations:   WNL   Planned  Pending:   Diagnostic bilateral cervical facet medial branch block #2    Under consideration:   Therapeutic bilateral cervical facet medial branch RFA #1    Completed:   Diagnostic/therapeutic bilateral cervical facet MBB x1 (01/05/2022) (100/100/100/100)  Diagnostic/therapeutic left caudal ESI + epidurogram x1 (11/17/2021) (100/100/25/25)    Therapeutic  Palliative (PRN) options:   Therapeutic/palliative left L4 TFESI #2  Therapeutic/palliative left L5 TFESI #2  Bilateral spinal cord stimulator trial (done - 01/17/2018)  Permanent bilateral spinal cord stimulator implant by Dr. Cari Caraway (neurosurgery) (done - 03/13/2018)   Pharmacotherapy  Nonopioids transferred 01/12/2020: Gabapentin       Recent Visits Date Type Provider Dept  03/06/22 Office Visit Milinda Pointer, [facility]  01/19/22 Office Visit Milinda Pointer, MD [facility]  01/05/22 Procedure visit Milinda Pointer, MD [facility]  12/26/21 Office Visit Milinda Pointer, MD [facility]  Showing recent visits  within past 90 days and meeting all other requirements Today's Visits Date Type Provider Dept  03/16/22 Procedure visit Milinda Pointer, MD [facility]  Showing today's visits and  meeting all other requirements Future Appointments Date Type Provider Dept  04/04/22 Appointment Milinda Pointer, MD [facility]  Showing future appointments within next 90 days and meeting all other requirements  Disposition: Discharge home  Discharge (Date  Time): 03/16/2022; 1133 hrs.   Primary Care Physician: Jodi Marble, MD Location: [facility] Note by: Gaspar Cola, MD Date: 03/16/2022; Time: 12:21 PM  Disclaimer:  Medicine is not an Chief Strategy Officer. The only guarantee in medicine is that nothing is guaranteed. It is important to note that the decision to proceed with this intervention was based on the information collected from the patient. The Data and conclusions were drawn from the patient's questionnaire, the interview, and the physical examination. Because the information was provided in large part by the patient, it cannot be guaranteed that it has not been purposely or unconsciously manipulated. Every effort has been made to obtain as much relevant data as possible for this evaluation. It is important to note that the conclusions that lead to this procedure are derived in large part from the available data. Always take into account that the treatment will also be dependent on availability of resources and existing treatment guidelines, considered by other Pain Management Practitioners as being common knowledge and practice, at the time of the intervention. For Medico-Legal purposes, it is also important to point out that variation in procedural techniques and pharmacological choices are the acceptable norm. The indications, contraindications, technique, and results of the above procedure should only be interpreted and judged by a Board-Certified Interventional Pain Specialist with extensive familiarity and expertise in the same exact procedure and technique.

## 2022-03-13 ENCOUNTER — Other Ambulatory Visit: Payer: Self-pay | Admitting: Internal Medicine

## 2022-03-13 DIAGNOSIS — M109 Gout, unspecified: Secondary | ICD-10-CM

## 2022-03-13 DIAGNOSIS — I10 Essential (primary) hypertension: Secondary | ICD-10-CM

## 2022-03-16 ENCOUNTER — Ambulatory Visit
Admission: RE | Admit: 2022-03-16 | Discharge: 2022-03-16 | Disposition: A | Discharge status: Home / Self Care | Payer: Medicare HMO | Source: Ambulatory Visit | Attending: Pain Medicine | Admitting: Pain Medicine

## 2022-03-16 ENCOUNTER — Encounter: Payer: Self-pay | Admitting: Pain Medicine

## 2022-03-16 ENCOUNTER — Ambulatory Visit: Payer: Medicare HMO | Attending: Pain Medicine | Admitting: Pain Medicine

## 2022-03-16 ENCOUNTER — Other Ambulatory Visit: Payer: Self-pay | Admitting: Internal Medicine

## 2022-03-16 VITALS — BP 118/57 | HR 68 | Temp 97.3°F | Resp 16 | Ht 72.0 in | Wt 195.0 lb

## 2022-03-16 DIAGNOSIS — G4486 Cervicogenic headache: Secondary | ICD-10-CM | POA: Insufficient documentation

## 2022-03-16 DIAGNOSIS — Z9682 Presence of neurostimulator: Secondary | ICD-10-CM | POA: Diagnosis not present

## 2022-03-16 DIAGNOSIS — M5030 Other cervical disc degeneration, unspecified cervical region: Secondary | ICD-10-CM | POA: Diagnosis not present

## 2022-03-16 DIAGNOSIS — G894 Chronic pain syndrome: Secondary | ICD-10-CM | POA: Diagnosis not present

## 2022-03-16 DIAGNOSIS — M542 Cervicalgia: Secondary | ICD-10-CM | POA: Diagnosis present

## 2022-03-16 DIAGNOSIS — M47812 Spondylosis without myelopathy or radiculopathy, cervical region: Secondary | ICD-10-CM | POA: Insufficient documentation

## 2022-03-16 MED ORDER — LACTATED RINGERS IV SOLN: Freq: Once | INTRAVENOUS | Status: AC

## 2022-03-16 MED ORDER — MIDAZOLAM HCL 5 MG/5ML IJ SOLN
0.5000 mg | Freq: Once | INTRAMUSCULAR | Status: AC
  Administered 2022-03-16: 2 mg via INTRAVENOUS

## 2022-03-16 MED ORDER — MIDAZOLAM HCL 5 MG/5ML IJ SOLN
INTRAMUSCULAR | Status: AC
  Filled 2022-03-16: qty 5

## 2022-03-16 MED ORDER — PENTAFLUOROPROP-TETRAFLUOROETH EX AERO
INHALATION_SPRAY | Freq: Once | CUTANEOUS | Status: AC
  Administered 2022-03-16: 30 via TOPICAL

## 2022-03-16 MED ORDER — LIDOCAINE HCL 2 % IJ SOLN
20.0000 mL | Freq: Once | INTRAMUSCULAR | Status: AC
  Administered 2022-03-16: 400 mg

## 2022-03-16 MED ORDER — DEXAMETHASONE SODIUM PHOSPHATE 10 MG/ML IJ SOLN
INTRAMUSCULAR | Status: AC
  Filled 2022-03-16: qty 2

## 2022-03-16 MED ORDER — ROPIVACAINE HCL 2 MG/ML IJ SOLN
18.0000 mL | Freq: Once | INTRAMUSCULAR | Status: AC
  Administered 2022-03-16: 18 mL via PERINEURAL

## 2022-03-16 MED ORDER — DEXAMETHASONE SODIUM PHOSPHATE 10 MG/ML IJ SOLN
20.0000 mg | Freq: Once | INTRAMUSCULAR | Status: AC
  Administered 2022-03-16: 20 mg

## 2022-03-16 MED ORDER — LIDOCAINE HCL 2 % IJ SOLN
INTRAMUSCULAR | Status: AC
  Filled 2022-03-16: qty 20

## 2022-03-16 MED ORDER — ROPIVACAINE HCL 2 MG/ML IJ SOLN
INTRAMUSCULAR | Status: AC
  Filled 2022-03-16: qty 20

## 2022-03-16 MED ORDER — FENTANYL CITRATE (PF) 100 MCG/2ML IJ SOLN
25.0000 ug | INTRAMUSCULAR | Status: DC | PRN
  Administered 2022-03-16: 50 ug via INTRAVENOUS

## 2022-03-16 MED ORDER — FENTANYL CITRATE (PF) 100 MCG/2ML IJ SOLN
INTRAMUSCULAR | Status: AC
  Filled 2022-03-16: qty 2

## 2022-03-16 NOTE — Patient Instructions (Signed)
  ____________________________________________________________________________________________  Patient Information update  To: All of our patients.  Re: Name change.  It has been made official that our current name, "[facility]"   will soon be changed to "[facility]".   The purpose of this change is to eliminate any confusion created by the concept of our practice being a "Medication Management Pain Clinic". In the past this has led to the misconception that we treat pain primarily by the use of prescription medications.  Nothing can be farther from the truth.   Understanding PAIN MANAGEMENT: To further understand what our practice does, you first have to understand that "Pain Management" is a subspecialty that requires additional training once a physician has completed their specialty training, which can be in either Anesthesia, Neurology, Psychiatry, or Physical Medicine and Rehabilitation (PMR). Each one of these contributes to the final approach taken by each physician to the management of their patient's pain. To be a "Pain Management Specialist" you must have first completed one of the specialty trainings below.  Anesthesiologists - trained in clinical pharmacology and interventional techniques such as nerve blockade and regional as well as central neuroanatomy. They are trained to block pain before, during, and after surgical interventions.  Neurologists - trained in the diagnosis and pharmacological treatment of complex neurological conditions, such as Multiple Sclerosis, Parkinson's, spinal cord injuries, and other systemic conditions that may be associated with symptoms that may include but are not limited to pain. They tend to rely primarily on the treatment of chronic pain using prescription medications.  Psychiatrist - trained in conditions affecting the psychosocial  wellbeing of patients including but not limited to depression, anxiety, schizophrenia, personality disorders, addiction, and other substance use disorders that may be associated with chronic pain. They tend to rely primarily on the treatment of chronic pain using prescription medications.   Physical Medicine and Rehabilitation (PMR) physicians, also known as physiatrists - trained to treat a wide variety of medical conditions affecting the brain, spinal cord, nerves, bones, joints, ligaments, muscles, and tendons. Their training is primarily aimed at treating patients that have suffered injuries that have caused severe physical impairment. Their training is primarily aimed at the physical therapy and rehabilitation of those patients. They may also work alongside orthopedic surgeons or neurosurgeons using their expertise in assisting surgical patients to recover after their surgeries.  INTERVENTIONAL PAIN MANAGEMENT is sub-subspecialty of Pain Management.  Our physicians are Board-certified in Anesthesia, Pain Management, and Interventional Pain Management.  This meaning that not only have they been trained and Board-certified in their specialty of Anesthesia, and subspecialty of Pain Management, but they have also received further training in the sub-subspecialty of Interventional Pain Management, in order to become Board-certified as INTERVENTIONAL PAIN MANAGEMENT SPECIALIST.    Mission: Our goal is to use our skills in  INTERVENTIONAL PAIN MANAGEMENT as alternatives to the chronic use of prescription opioid medications for the treatment of pain. To make this more clear, we have changed our name to reflect what we do and offer. We will continue to offer medication management assessment and recommendations, but we will not be taking over any patient's medication management.  ____________________________________________________________________________________________       cheeks); mood swings; menstrual changes. Uncommon: Long-term decrease or suppression of natural hormones; bone thinning. (These are more common with higher doses or more frequent use. This is why we prefer that our patients avoid having any injection therapies in other practices.)  Very Rare: Severe mood changes; psychosis; aseptic necrosis. From procedure: Some discomfort is to be expected once  the numbing medicine wears off. This should be minimal if ice and heat are applied as instructed.  Call if: (When should I call?) You experience numbness and weakness that gets worse with time, as opposed to wearing off. New onset bowel or bladder incontinence. (Applies only to procedures done in the spine)  Emergency Numbers: Durning business hours (Monday - Thursday, 8:00 AM - 4:00 PM) (Friday, 9:00 AM - 12:00 Noon): [phone] After hours: [phone] NOTE: If you are having a problem and are unable connect with, or to talk to a provider, then go to your nearest urgent care or emergency department. If the problem is serious and urgent, please call 911. ____________________________________________________________________________________________    

## 2022-03-17 ENCOUNTER — Telehealth: Payer: Self-pay

## 2022-03-17 DIAGNOSIS — H353134 Nonexudative age-related macular degeneration, bilateral, advanced atrophic with subfoveal involvement: Secondary | ICD-10-CM | POA: Diagnosis not present

## 2022-03-17 DIAGNOSIS — H35033 Hypertensive retinopathy, bilateral: Secondary | ICD-10-CM | POA: Diagnosis not present

## 2022-03-17 DIAGNOSIS — H04123 Dry eye syndrome of bilateral lacrimal glands: Secondary | ICD-10-CM | POA: Diagnosis not present

## 2022-03-17 NOTE — Telephone Encounter (Signed)
Post procedure follow. Patients daughter states he is doing good.

## 2022-03-28 ENCOUNTER — Ambulatory Visit: Payer: Medicare HMO | Admitting: Psychiatry

## 2022-03-29 ENCOUNTER — Ambulatory Visit (INDEPENDENT_AMBULATORY_CARE_PROVIDER_SITE_OTHER): Payer: Worker's Compensation | Admitting: Licensed Clinical Social Worker

## 2022-03-29 DIAGNOSIS — F331 Major depressive disorder, recurrent, moderate: Secondary | ICD-10-CM

## 2022-03-29 DIAGNOSIS — R69 Illness, unspecified: Secondary | ICD-10-CM | POA: Diagnosis not present

## 2022-03-30 NOTE — Progress Notes (Addendum)
THERAPIST PROGRESS NOTE  Session Time: 3:00PM-4:01PM  Participation Level: Active  Behavioral Response: Casual, Neat, and Well GroomedAlertDepressed  Type of Therapy: Individual Therapy  Treatment Goals addressed:  Participate in at least 80% of scheduled individual psychotherapy sessions  Recognize sxs of depression  Learned skills to assist in developing healthy thinking patterns about self  Working to resolve core conflicts that are primary sources of anxiety for pt  Reduce overall frequency, intensity and duration of depression so that daily functioning is not impaired per pt self report 3 out of 5 sessions documented.    Develop healthy thinking patterns about self, others and beliefs about self to help alleviate symptoms of depression per pt report 3 out 5 sessions.    Appropriately grieve losses in order to normalize mood and improve symptoms of depression per pt report 3 out 5 sessions.    ProgressTowards Goals: Progressing  Interventions: DBT, Strength-based, and Other: ACT  Summary:  Encounter Diagnosis  Name Primary?   MDD (major depressive disorder), recurrent episode, moderate ([name]) Yes    AANAV Mendez is a 88 y.o. male who presents in person with continued sxs of depression and anxiety re: grief of loved ones and experiencing grief of who he used to be. Some sxs include but are not limited to crying spells, guilt/shame, worry, fatigue, lack of motivation, lack of interest, and negative self affect. Pt oriented to person, place, and time.   Pt attended therapy alone for the first 50 minutes and his wife joined for the last 11 minutes of session to provide collateral. Pt and wife assisted in re-evaluating tx plan and agreed to continue with goals listed.   Pt reports experiencing difficult emotions re: losing his vision and declines in health. Pt reports that he would feel much happier if he could regain his vision. Pt reports that he has a f/u appt on March  15th for another consultation with opthalmologist.  Provided pt room to process emotions re: changes in his health and wellness.   Pt reports that he is continuing to take sertraline as prescribed and reports improvements on his outlook of life since starting sertraline.   Pt utilized therapeutic space to continue to process grief re: friends and family who have passed and grief re: relationships that have changed over time. Pt endorsed disappointment in self re: wishing younger self would have made different life choices. 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 Discussed importance of self compassion for younger self.   Pt shared importance of his faith in his tx and reports desire to acknowledge his faith as his primary source of hope.   Pt reports enjoying time in his man cave, singing, spending time with his cat, and praying for self care.      03/29/2022    5:16 PM 03/09/2022   11:24 AM 02/17/2022   10:13 AM 02/14/2022   11:21 AM  GAD 7 : Generalized Anxiety Score  Nervous, Anxious, on Edge '1 3 3 1  '$ Control/stop worrying '3 3 2 1  '$ Worry too much - different things '3 3 1 1  '$ Trouble relaxing '2 3 2 1  '$ Restless '1 3 2 3  '$ Easily annoyed or irritable '3 3 3 2  '$ Afraid - awful might happen 3 0 3 3  Total GAD 7 Score '16 18 16 12  '$ Anxiety Difficulty Extremely difficult Not difficult at all Very difficult Very difficult  03/29/2022    5:15 PM 03/16/2022    9:20 AM 03/09/2022   11:22 AM  Depression screen PHQ 2/9  Decreased Interest 3 0 3  Down, Depressed, Hopeless 1 0 1  PHQ - 2 Score 4 0 4  Altered sleeping 2  3  Tired, decreased energy 3  3  Change in appetite 3  3  Feeling bad or failure about yourself  3  3  Trouble concentrating 1  3  Moving slowly or fidgety/restless 2  3  Suicidal thoughts 0  0  PHQ-9 Score 18  22  Difficult doing work/chores Extremely dIfficult  Not difficult at all     Suicidal/Homicidal: Nowithout  intent/plan  Therapist Response:  Introduced pt to Dialectical Behavior Therapy and the importance of acceptance and change. Discussed concept of radical acceptance and also assisted pt in learning barriers to engaging in radical acceptance in journey towards change. Discussed importance of leaning into the dialectic and taught pt "and also" statements to assist in engaging with the bothness of change.    LCSW introduced pt to Acceptance and Commitment Therapy. Pt engaged in discussion on how to explore what they must accept in order to commit to what they have identified as important. Introduced pt to values directed goal exploration in order to identify goals of importance.    LCSW provided pt psychoed re: self compassion and self forgiveness and its importance in moving on with other relationships in life and in processing grief. Provided pt a sample tx order for how to utilize therapeutic space going forward in order to pursue and achieve increased self compassion and self forgiveness.  Homework: Invited pt to either write a letter to self or engage in a conversation with self out loud as if sitting beside oneself to discuss what and why pt continues to feel resentment towards self in order to create a conversation with pt's inner child and to assist in pursuing a conversation about forgiveness of inner child. Encouraged pt to lean on support system for additional guidance as needed during this exercise.   Plan: Return again in 2 weeks for continued work on listed goals.  Diagnosis: MDD (major depressive disorder), recurrent episode, moderate ([place])     03/29/2022    5:15 PM 03/16/2022    9:20 AM 03/09/2022   11:22 AM  Depression screen PHQ 2/9  Decreased Interest 3 0 3  Down, Depressed, Hopeless 1 0 1  PHQ - 2 Score 4 0 4  Altered sleeping 2  3  Tired, decreased energy 3  3  Change in appetite 3  3  Feeling bad or failure about yourself  3  3  Trouble concentrating 1  3  Moving slowly or  fidgety/restless 2  3  Suicidal thoughts 0  0  PHQ-9 Score 18  22  Difficult doing work/chores Extremely dIfficult  Not difficult at all       03/29/2022    5:16 PM 03/09/2022   11:24 AM 02/17/2022   10:13 AM 02/14/2022   11:21 AM  GAD 7 : Generalized Anxiety Score  Nervous, Anxious, on Edge '1 3 3 1  '$ Control/stop worrying '3 3 2 1  '$ Worry too much - different things '3 3 1 1  '$ Trouble relaxing '2 3 2 1  '$ Restless '1 3 2 3  '$ Easily annoyed or irritable '3 3 3 2  '$ Afraid - awful might happen 3 0 3 3  Total GAD 7 Score '16 18 16 12  '$ Anxiety Difficulty  Extremely difficult Not difficult at all Very difficult Very difficult      Collaboration of Care: Psychiatrist Chase Mendez  Patient/Guardian was advised Release of Information must be obtained prior to any record release in order to collaborate their care with an outside provider. Patient/Guardian was advised if they have not already done so to contact the registration department to sign all necessary forms in order for [place] to release information regarding their care.   Consent: Patient/Guardian gives verbal consent for treatment and assignment of benefits for services provided during this visit. Patient/Guardian expressed understanding and agreed to proceed.   Blair Dolphin, LCSW 03/30/2022

## 2022-03-30 NOTE — BH OP Treatment Plan (Addendum)
Tx plan reviewed with pt and pt's wife during 03/29/2022 session and agreed to continue goals. Pt provided verbal permission for LCSW to virtually sign tx plan on pt's behalf.

## 2022-04-03 ENCOUNTER — Other Ambulatory Visit: Payer: Self-pay | Admitting: Internal Medicine

## 2022-04-03 ENCOUNTER — Encounter: Payer: Self-pay | Admitting: Pain Medicine

## 2022-04-03 ENCOUNTER — Other Ambulatory Visit: Payer: Self-pay | Admitting: Cardiovascular Disease

## 2022-04-03 DIAGNOSIS — R0789 Other chest pain: Secondary | ICD-10-CM

## 2022-04-04 ENCOUNTER — Ambulatory Visit: Payer: Medicare HMO | Attending: Pain Medicine | Admitting: Pain Medicine

## 2022-04-04 DIAGNOSIS — M5030 Other cervical disc degeneration, unspecified cervical region: Secondary | ICD-10-CM

## 2022-04-04 DIAGNOSIS — M47812 Spondylosis without myelopathy or radiculopathy, cervical region: Secondary | ICD-10-CM

## 2022-04-04 DIAGNOSIS — M542 Cervicalgia: Secondary | ICD-10-CM

## 2022-04-04 NOTE — Progress Notes (Signed)
Patient: Chase Mendez  Service Category: E/M  Provider: Gaspar Cola, MD  DOB: 1934-07-28  DOS: 04/04/2022  Location: Office  MRN: [id number]  Setting: Ambulatory outpatient  Referring Provider: Jodi Marble, MD  Type: Established Patient  Specialty: Interventional Pain Management  PCP: Jodi Marble, MD  Location: Remote location  Delivery: TeleHealth     Virtual Encounter - Pain Management PROVIDER NOTE: Information contained herein reflects review and annotations entered in association with encounter. Interpretation of such information and data should be left to medically-trained personnel. Information provided to patient can be located elsewhere in the medical record under "Patient Instructions". Document created using STT-dictation technology, any transcriptional errors that may result from process are unintentional.    Contact & Pharmacy Preferred: [phone] Home: [phone] (home) Mobile: There is no such number on file (mobile). E-mail: [email or web address]  [facility] - [place] [address] Phone: [phone] Fax: [phone]  [id number] [id number]: [phone]: [phone]  Pre-screening  Mr. RFiegeloffered "in-person" vs "virtual" encounter. He indicated preferring virtual for this encounter.   Reason COVID-19*  Social distancing based on CDC and AMA recommendations.   I contacted [name] 04/04/2022 via telephone.      I clearly identified myself as FGaspar Cola MD. I verified that I was speaking with the correct person using two identifiers (Name: HZYMEER HEWITT and date of birth: [birth year].  Consent I sought verbal advanced consent from HAmmie Ferrierfor virtual visit interactions. I informed Mr. RKeathof possible security and privacy concerns, risks, and limitations associated with  providing "not-in-person" medical evaluation and management services. I also informed Mr. RSantangeloof the availability of "in-person" appointments. Finally, I informed him that there would be a charge for the virtual visit and that he could be  personally, fully or partially, financially responsible for it. Mr. RFriedersexpressed understanding and agreed to proceed.   Historic Elements   Mr. HJAKYREE ERICKSENis a 88y.o. year old, male patient evaluated today after our last contact on 03/16/2022. Mr. RNilsson has a past medical history of Acute encephalopathy (12/08/2014), Anxiety, ARF (acute renal failure) ([place] (12/08/2014), Back pain, Benign neoplasm of large bowel, Capsulitis, Chronic back pain, Coronavirus infection, Depression, Diabetes mellitus without complication ([place], Dysphagia, Exostosis, Foot drop, left, Frequent falls (02/2018), GERD (gastroesophageal reflux disease), Gout, Hypertension, Hypothyroidism, Insomnia, Low testosterone, Microscopic hematuria (2016), Myocardial infarction ([place], Pneumonia (12/07/2014), Pressure ulcer (12/09/2014), Sepsis ([place] (12/08/2014), and Thyroid disease. He also  has a past surgical history that includes Cholecystectomy; Esophagogastroduodenoscopy (egd) with propofol (N/A, 04/24/2017); Back surgery (1995, 1996); Eye surgery (Bilateral, 1983, 1985); Appendectomy; and Spinal cord stimulator insertion (N/A, 03/13/2018). Mr. RHollinshas a current medication list which includes the following prescription(s): acetaminophen, allopurinol, amlodipine, aspirin, atorvastatin, fluticasone, gabapentin, isosorbide mononitrate, levothyroxine, losartan, naloxone, ondansetron, oxycodone hcl, [START ON 04/10/2022] oxycodone hcl, [START ON 05/10/2022] oxycodone hcl, sertraline, tamsulosin, testosterone, cyanocobalamin, vitamin e, pantoprazole, and [DISCONTINUED] diphenhydramine. He  reports that he has been smoking cigars and cigarettes. He has a 35.00 pack-year smoking history. He has never used  smokeless tobacco. He reports current drug use. Drug: Oxycodone. He reports that he does not drink alcohol. Mr. RWeidelis allergic to doxycycline, ibuprofen, and sulfa antibiotics.  Estimated body mass index is 26.45 kg/m as calculated from the following:   Height  as of 03/16/22: 6' (1.829 m).   Weight as of 03/16/22: 195 lb (88.5 kg).  HPI  Today, he is being contacted for a post-procedure assessment.  The patient indicates having attained 100% relief of the pain that lasted for 1 week after that the pain started slowly to come back and currently he has only an ongoing 20% improvement.  Based on these results, I have informed the patient that he may be a good candidate for radiofrequency ablation.  He indicated to be aware of what the radiofrequency ablation is and having had that done in the past for his low back pain.  At this point, he feels that he is doing okay and he wants to hold.  I have reminded him to give [place] a call if the pain worsens or if he decides to go with the radiofrequency ablation.  He understood and accepted.  Post-procedure evaluation   Type: Cervical Facet Medial Branch Block(s) #2  Laterality: Bilateral  Level: C3, C4, C5, C6, and C7 Medial Branch Level(s). Injecting these levels blocks the C3-4, C4-5, C5-6, and C6-7 cervical facet joints.  Imaging: Fluoroscopic guidance Anesthesia: Local anesthesia (1-2% Lidocaine) Anxiolysis: IV Versed 3.0 mg Sedation: Moderate Sedation Fentanyl 1 mL (50 mcg) DOS: 03/16/2022  Performed by: Gaspar Cola, MD  Purpose: Diagnostic/Therapeutic Indications: Cervicalgia (cervical spine axial pain) severe enough to impact quality of life or function. 1. Cervicalgia   2. Cervical facet syndrome   3. DDD (degenerative disc disease), cervical   4. Spondylosis without myelopathy or radiculopathy, cervical region   5. Chronic pain syndrome   6. Presence of neurostimulator    NAS-11 Pain score:   Pre-procedure: 6 /10   Post-procedure:  0-No pain/10      Effectiveness:  Initial hour after procedure: 100 %. Subsequent 4-6 hours post-procedure: 100 %. Analgesia past initial 6 hours: 100 % x 1wk, then it is only went down to an ongoing 20% improvement.. Ongoing improvement:  Analgesic: The patient indicates having an ongoing 20% improvement of the Function: Somewhat improved ROM: Mr. Hagadorn reports improvement in ROM   Pharmacotherapy Assessment   Opioid Analgesic: Oxycodone IR 10 mg, 1 tab PO q 6 hrs. (40 mg/day of oxycodone) MME/day: 60 mg/day.   Monitoring: [place] PMP: PDMP not reviewed this encounter.       Pharmacotherapy: No side-effects or adverse reactions reported. Compliance: No problems identified. Effectiveness: Clinically acceptable. Plan: Refer to "POC". UDS:  Summary  Date Value Ref Range Status  08/03/2021 Note  Final    Comment:    ==================================================================== [employer] Select 13 (MW) ==================================================================== Test                             Result       Flag       Units  Drug Present and Declared for Prescription Verification   Oxycodone                      3770         EXPECTED   ng/mg creat   Oxymorphone                    1689         EXPECTED   ng/mg creat   Noroxycodone                   4295  EXPECTED   ng/mg creat   Noroxymorphone                 909          EXPECTED   ng/mg creat    Sources of oxycodone are scheduled prescription medications.    Oxymorphone, noroxycodone, and noroxymorphone are expected    metabolites of oxycodone. Oxymorphone is also available as a    scheduled prescription medication.  ==================================================================== Test                      Result    Flag   Units      Ref Range   Creatinine              148              mg/dL      >=20 ==================================================================== Declared Medications:  The  flagging and interpretation on this report are based on the  following declared medications.  Unexpected results may arise from  inaccuracies in the declared medications.   **Note: The testing scope of this panel includes these medications:   Oxycodone   **Note: The testing scope of this panel does not include the  following reported medications:   Allopurinol (Zyloprim)  Amlodipine (Norvasc)  Aspirin  Atorvastatin (Lipitor)  Cyanocobalamin  Diphenhydramine (Benadryl)  Duloxetine (Cymbalta)  Gabapentin (Neurontin)  Isosorbide (Imdur)  Levothyroxine (Synthroid)  Losartan (Cozaar)  Ondansetron (Zofran)  Pantoprazole (Protonix)  Tamsulosin (Flomax)  Testosterone  Vitamin E ==================================================================== For clinical consultation, please call [phone]. ====================================================================    No results found for: "CBDTHCR", "D8THCCBX", "D9THCCBX"   Laboratory Chemistry Profile   Renal Lab Results  Component Value Date   BUN 23 02/14/2022   CREATININE 1.42 (H) 02/14/2022   GFRAA >60 01/28/2019   GFRNONAA 48 (L) 02/14/2022    Hepatic Lab Results  Component Value Date   AST 22 02/14/2022   ALT 13 02/14/2022   ALBUMIN 4.1 02/14/2022   ALKPHOS 90 02/14/2022   HCVAB <0.1 07/24/2018   LIPASE 37 08/21/2016    Electrolytes Lab Results  Component Value Date   NA 139 02/14/2022   K 4.4 02/14/2022   CL 102 02/14/2022   CALCIUM 9.0 02/14/2022    Bone No results found for: "VD25OH", "VD125OH2TOT", "[id number]", "[id number]", "25OHVITD1", "25OHVITD2", "25OHVITD3", "TESTOFREE", "TESTOSTERONE"  Inflammation (CRP: Acute Phase) (ESR: Chronic Phase) Lab Results  Component Value Date   LATICACIDVEN 1.1 12/08/2014         Note: Above Lab results reviewed.  Imaging  DG PAIN CLINIC C-ARM 1-60 MIN NO REPORT Fluoro was used, but no Radiologist interpretation will be provided.  Please refer to "NOTES"  tab for provider progress note.  Assessment  The primary encounter diagnosis was Cervicalgia. Diagnoses of Cervical facet syndrome, DDD (degenerative disc disease), cervical, and Spondylosis without myelopathy or radiculopathy, cervical region were also pertinent to this visit.  Plan of Care  Problem-specific:  No problem-specific Assessment & Plan notes found for this encounter.  Mr. JOHNIEL GARBARINI has a current medication list which includes the following long-term medication(s): allopurinol, amlodipine, isosorbide mononitrate, levothyroxine, losartan, oxycodone hcl, [START ON 04/10/2022] oxycodone hcl, [START ON 05/10/2022] oxycodone hcl, sertraline, testosterone, pantoprazole, and [DISCONTINUED] diphenhydramine.  Pharmacotherapy (Medications Ordered): No orders of the defined types were placed in this encounter.  Orders:  No orders of the defined types were placed in this encounter.  Follow-up plan:   No follow-ups on file.  Interventional Therapies  Risk  Complexity Considerations:   WNL   Planned  Pending:      Under consideration:   Therapeutic bilateral cervical facet MB RFA #1    Completed:   Diagnostic/therapeutic bilateral cervical facet MBB x2 (03/16/2022) (1st:100/100/100/100) (2nd: 100/100/100/20) Diagnostic/therapeutic left caudal ESI + epidurogram x1 (11/17/2021) (100/100/25/25)    Therapeutic  Palliative (PRN) options:   Therapeutic/palliative left L4 TFESI #2  Therapeutic/palliative left L5 TFESI #2  Bilateral spinal cord stimulator trial (done - 01/17/2018)  Permanent bilateral spinal cord stimulator implant by Dr. Cari Caraway (neurosurgery) (done - 03/13/2018)   Pharmacotherapy  Nonopioids transferred 01/12/2020: Gabapentin       Recent Visits Date Type Provider Dept  03/16/22 Procedure visit Milinda Pointer, MD [facility]  03/06/22 Office Visit Milinda Pointer, MD [facility]  01/19/22 Office Visit Milinda Pointer,  MD [facility]  01/05/22 Procedure visit Milinda Pointer, MD [facility]  Showing recent visits within past 90 days and meeting all other requirements Today's Visits Date Type Provider Dept  04/04/22 Office Visit Milinda Pointer, MD [facility]  Showing today's visits and meeting all other requirements Future Appointments No visits were found meeting these conditions. Showing future appointments within next 90 days and meeting all other requirements  I discussed the assessment and treatment plan with the patient. The patient was provided an opportunity to ask questions and all were answered. The patient agreed with the plan and demonstrated an understanding of the instructions.  Patient advised to call back or seek an in-person evaluation if the symptoms or condition worsens.  Duration of encounter: 18 minutes.  Note by: Gaspar Cola, MD Date: 04/04/2022; Time: 3:23 PM

## 2022-04-06 ENCOUNTER — Other Ambulatory Visit: Payer: Self-pay | Admitting: Cardiovascular Disease

## 2022-04-06 DIAGNOSIS — X32XXXA Exposure to sunlight, initial encounter: Secondary | ICD-10-CM | POA: Diagnosis not present

## 2022-04-06 DIAGNOSIS — R0789 Other chest pain: Secondary | ICD-10-CM

## 2022-04-06 DIAGNOSIS — B372 Candidiasis of skin and nail: Secondary | ICD-10-CM | POA: Diagnosis not present

## 2022-04-06 DIAGNOSIS — Z85828 Personal history of other malignant neoplasm of skin: Secondary | ICD-10-CM | POA: Diagnosis not present

## 2022-04-06 DIAGNOSIS — D2262 Melanocytic nevi of left upper limb, including shoulder: Secondary | ICD-10-CM | POA: Diagnosis not present

## 2022-04-06 DIAGNOSIS — D2272 Melanocytic nevi of left lower limb, including hip: Secondary | ICD-10-CM | POA: Diagnosis not present

## 2022-04-06 DIAGNOSIS — D2261 Melanocytic nevi of right upper limb, including shoulder: Secondary | ICD-10-CM | POA: Diagnosis not present

## 2022-04-06 DIAGNOSIS — L570 Actinic keratosis: Secondary | ICD-10-CM | POA: Diagnosis not present

## 2022-04-08 ENCOUNTER — Other Ambulatory Visit: Payer: Self-pay | Admitting: Internal Medicine

## 2022-04-11 ENCOUNTER — Ambulatory Visit (INDEPENDENT_AMBULATORY_CARE_PROVIDER_SITE_OTHER): Payer: Worker's Compensation | Admitting: Licensed Clinical Social Worker

## 2022-04-11 DIAGNOSIS — F331 Major depressive disorder, recurrent, moderate: Secondary | ICD-10-CM

## 2022-04-11 DIAGNOSIS — R69 Illness, unspecified: Secondary | ICD-10-CM | POA: Diagnosis not present

## 2022-04-11 NOTE — Progress Notes (Signed)
THERAPIST PROGRESS NOTE  Session Time: 10:01AM-11:00AM  Participation Level: Active  Behavioral Response: Casual, Neat, and Well GroomedAlertDepressed  Type of Therapy: Individual Therapy  Treatment Goals addressed:  The following goals were addressed in today's session, as well as the previous session.   Participate in at least 80% of scheduled individual psychotherapy sessions  Recognize sxs of depression  Learned skills to assist in developing healthy thinking patterns about self  Working to resolve core conflicts that are primary sources of anxiety for pt  Reduce overall frequency, intensity and duration of depression so that daily functioning is not impaired per pt self report 3 out of 5 sessions documented.    Develop healthy thinking patterns about self, others and beliefs about self to help alleviate symptoms of depression per pt report 3 out 5 sessions.    Appropriately grieve losses in order to normalize mood and improve symptoms of depression per pt report 3 out 5 sessions.    ProgressTowards Goals: Progressing Pt is continuing to apply interventions/techniques learned in session into daily life situations. Pt is currently on track to meet goals utilizing interventions that are discussed in session. Treatment to continue as indicated. Personal growth and progress toward goals noted above.   Interventions: DBT, Strength-based, and Other: ACT  Summary:  Encounter Diagnosis  Name Primary?   MDD (major depressive disorder), recurrent episode, moderate ([place]) Yes     Chase Mendez is a 88 y.o. male who presents in person with continued sxs of depression and anxiety re: grief of loved ones and experiencing grief of who he used to be. Some sxs include but are not limited to guilt/shame, worry, fatigue, lack of motivation, lack of interest, depressed mood, and negative self affect. Pt oriented to person, place, and time. Pt denies SI/HI or A/V hallucinations. Pt was  cooperative during visit and was engaged throughout the visit. Pt does not report any other concerns at the time of visit.   Pt attended therapy alone for the first 55 minutes and his wife joined for the last 4 minutes of session to provide collateral. Pt and wife assisted in re-evaluating tx plan and agreed to continue with goals listed. Wife, Earlie Server, reported noticing that the pt names goals and has poor follow through. Pt agreed with wife's statement.   LCSW introduced pt to DBT skill of Radical Acceptance to normalize feelings of discomfort that arise when re-integrating into something we have stepped away from, such as attending lodge each week. LCSW informed pt that radical acceptance is utilized when there is nothing we can do to change the discomfort except for accept that it will be present. Pt identified ways they have exercised control and how they discerned radical acceptance to be a necessary skill for their situation. Pt shared barriers to radical acceptance and discussed ways they plan to overcome those barriers in order to best utilize radical acceptance.    Pt reports that he is continuing to take sertraline as prescribed and reports improvements on his outlook of life since starting sertraline.   Pt endorsed disappointment in self re: wishing younger self would have made different life choices. 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 Discussed importance of self compassion for younger self. Discussed how every decision has pros and cons. Taught pt good, bad, gratitude processing exercise to assist pt in viewing all sides of situation or circumstance being processed. Stressed importance of ending with gratitude as gratitude can serve as  a form of closure during processing work.    Discussed with pt how "should" statements are fuel to depressive fire. Pt validated in experiencing frustrations towards younger self and encouraged to explore  both pros and cons of his actions in order to pursue a neutral mindset free of intense judgments about past. This skill it utilized to create access to self compassion.   Pt reported that he attempted homework from previous session and shared questions that arose from that homework. Pt reported feeling punished and feeling that that could be why he is undergoing trials. LCSW inquired with pt about what he feels he deserves to be punished for and discussed concept of life sometimes being hard even without the fault of our own. Pt practiced acceptance that aging yields good and bad changes not inspired by his good or bad actions.     Continued Recommendations as followed: Self-care behaviors, positive social engagements, focusing on positive physical and emotional wellness, and focusing on life/work balance.    Suicidal/Homicidal: Nowithout intent/plan  Therapist Response:  LCSW provided mood monitoring and treatment progress review in the context of this episode of treatment. LCSW reviewed the pt's mood status since last session.    Provided pt education re: acceptance. Discussed how to make acceptance accessible at all parts of pt's healing journey.    LCSW introduced pt to action planning. Pt practiced naming long term goal and worked with LCSW to define measurable "mile marker" goals that feel achievable to himself as he is now to assist him in pursuing long term goal.    LCSW practiced active listening to validate pt participation, build rapport, and create safe space for pt to feel heard as they are disclosing their thoughts and feelings.    Taught pt good, bad, gratitude processing exercise to assist pt in viewing all sides of situation or circumstance being processed. Stressed importance of ending with gratitude as gratitude can serve as a form of closure during processing work.    Approached pt with strengths based perspective to assist pt in exploring strengths in moments of feeling low.     LCSW utilized therapeutic conversation skills informed by CBT, DBT, and ACT to expose pt to multiple ways of thinking about healing and to provide pt to access to multiple interventions.   Homework: Invited pt to practice good, bad, gratitude to explore the pros and cons attached to "should" statements. Encouraged pt to practice radical acceptance when reintegrating into experiences pt has been removed from to assist in getting started again.   Plan: Return again in 4 weeks for continued work on listed goals.  Diagnosis: MDD (major depressive disorder), recurrent episode, moderate ([place])     04/11/2022   11:39 AM 03/29/2022    5:15 PM 03/16/2022    9:20 AM  Depression screen PHQ 2/9  Decreased Interest 2 3 0  Down, Depressed, Hopeless 3 1 0  PHQ - 2 Score 5 4 0  Altered sleeping 1 2   Tired, decreased energy 1 3   Change in appetite 0 3   Feeling bad or failure about yourself  3 3   Trouble concentrating 3 1   Moving slowly or fidgety/restless 2 2   Suicidal thoughts 0 0   PHQ-9 Score 15 18   Difficult doing work/chores Extremely dIfficult Extremely dIfficult        04/11/2022   11:39 AM 03/29/2022    5:16 PM 03/09/2022   11:24 AM 02/17/2022   10:13 AM  GAD  7 : Generalized Anxiety Score  Nervous, Anxious, on Edge '2 1 3 3  '$ Control/stop worrying '2 3 3 2  '$ Worry too much - different things '2 3 3 1  '$ Trouble relaxing '1 2 3 2  '$ Restless '1 1 3 2  '$ Easily annoyed or irritable '2 3 3 3  '$ Afraid - awful might happen 2 3 0 3  Total GAD 7 Score '12 16 18 16  '$ Anxiety Difficulty Extremely difficult Extremely difficult Not difficult at all Very difficult      Collaboration of Care: Psychiatrist AEB Dr. Modesta Messing  Patient/Guardian was advised Release of Information must be obtained prior to any record release in order to collaborate their care with an outside provider. Patient/Guardian was advised if they have not already done so to contact the registration department to sign all necessary forms in  order for [place] to release information regarding their care.   Consent: Patient/Guardian gives verbal consent for treatment and assignment of benefits for services provided during this visit. Patient/Guardian expressed understanding and agreed to proceed.   Blair Dolphin, LCSW 04/11/2022

## 2022-04-12 ENCOUNTER — Other Ambulatory Visit: Payer: Self-pay | Admitting: Cardiovascular Disease

## 2022-04-12 DIAGNOSIS — R0789 Other chest pain: Secondary | ICD-10-CM

## 2022-04-17 ENCOUNTER — Other Ambulatory Visit: Payer: Self-pay | Admitting: Cardiovascular Disease

## 2022-04-17 DIAGNOSIS — R0789 Other chest pain: Secondary | ICD-10-CM

## 2022-04-18 MED ORDER — ISOSORBIDE MONONITRATE ER 30 MG PO TB24: 30.0000 mg | ORAL_TABLET | Freq: Every day | ORAL | 0 refills | Status: DC

## 2022-04-21 DIAGNOSIS — H353131 Nonexudative age-related macular degeneration, bilateral, early dry stage: Secondary | ICD-10-CM | POA: Diagnosis not present

## 2022-04-21 DIAGNOSIS — Z961 Presence of intraocular lens: Secondary | ICD-10-CM | POA: Diagnosis not present

## 2022-04-29 ENCOUNTER — Other Ambulatory Visit: Payer: Self-pay | Admitting: Internal Medicine

## 2022-05-05 ENCOUNTER — Other Ambulatory Visit: Payer: Self-pay | Admitting: Internal Medicine

## 2022-05-06 NOTE — Progress Notes (Signed)
BH MD/PA/NP OP Progress Note  05/09/2022 3:14 PM Chase Mendez  MRN:  [id number]  Chief Complaint:  Chief Complaint  Patient presents with   Follow-up   HPI:  -According to the chart review, he was prescribed pantoprazole followed by famotidine for chronic nausea.  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  Although he can see to get around, it has been difficult.  He was advised that he may have glasses for reading.  He also has hearing impairment.  Although he used to take those for granted, he is now having difficulty.  He is unable to drive a car.  He thinks about the time he used to drive although over the [place].  He wishes to stay in the [employer].  However, he also said he would not have been able to met with his current wife.  He agrees to explore his current situation while  acknowledge his feelings of sadness and loss.  He was seen by his provider for nausea.  It has been going on for the past few weeks.  He wonders if he can be prescribed Valium as it worked well in the past.  He verbalized understanding that this medication will not be prescribed due to risk of fall and given his on opioid. The patient has mood symptoms as in PHQ-9/GAD-7. He sleeps well. He denies SI.  He denies alcohol use or drug use.  He thinks this therapy with Ms. Bounds went very well, and is willing to continue.  Chase Mendez, his wife presents to the visit.  She thinks he has been doing better.  He is able to do things in the house.  He was able to attend a meeting, and went to [place], interacting with others more often.  He has been more talkative with her and her daughter.  She feels comfortable with uptitration of sertraline.    Wt Readings from Last 3 Encounters:  05/09/22 201 lb 14.4 oz (91.6 kg)  03/16/22 195 lb (88.5 kg)  03/09/22 193 lb (87.5 kg)    Visit Diagnosis:    ICD-10-CM   1. MDD (major depressive disorder), recurrent episode, moderate  F33.1     2. Neurocognitive  disorder  R41.9       Past Psychiatric History: Please see initial evaluation for full details. I have reviewed the history. No updates at this time.     Past Medical History:  Past Medical History:  Diagnosis Date   Acute encephalopathy 12/08/2014   Anxiety    ARF (acute renal failure) 12/08/2014   Back pain    Benign neoplasm of large bowel    Capsulitis    fractured displaced metatarsal with capsulitis   Chronic back pain    Coronavirus infection    Depression    Diabetes mellitus without complication    no medications currently   Dysphagia    Exostosis    painful, right hallux   Foot drop, left    wears a brace   Frequent falls 02/2018   poor balance, foot drop   GERD (gastroesophageal reflux disease)    Gout    Hypertension    Hypothyroidism    Insomnia    Low testosterone    Microscopic hematuria 2016   Myocardial infarction    patient unaware when it happened years ago.     Pneumonia 12/07/2014   Pressure ulcer 12/09/2014   Sepsis 12/08/2014   Thyroid disease     Past Surgical  History:  Procedure Laterality Date   [place]   x 2   CHOLECYSTECTOMY     ESOPHAGOGASTRODUODENOSCOPY (EGD) WITH PROPOFOL N/A 04/24/2017   Procedure: ESOPHAGOGASTRODUODENOSCOPY (EGD) WITH PROPOFOL;  Surgeon: Manya Silvas, MD;  Location: [facility] ENDOSCOPY;  Service: Endoscopy;  Laterality: N/A;   EYE SURGERY Bilateral 1983, 1985   cataract extractions   SPINAL CORD STIMULATOR INSERTION N/A 03/13/2018   Procedure: SPINAL CORD STIMULATOR INSERTION;  Surgeon: Meade Maw, MD;  Location: [facility] ORS;  Service: Neurosurgery;  Laterality: N/A;    Family Psychiatric History: Please see initial evaluation for full details. I have reviewed the history. No updates at this time.     Family History:  Family History  Problem Relation Age of Onset   Heart disease Mother    Diabetes Father    Kidney cancer Brother     Social History:  Social History    Socioeconomic History   Marital status: Married    Spouse name: Chase Mendez   Number of children: 2   Years of education: Not on file   Highest education level: Some college, no degree  Occupational History   Occupation: maintenance / repair    Comment: disabled  Tobacco Use   Smoking status: Every Day    Packs/day: 0.50    Years: 70.00    Additional pack years: 0.00    Total pack years: 35.00    Types: Cigars, Cigarettes   Smokeless tobacco: Never   Tobacco comments:    unable to give cessation materials due to webex visit   Vaping Use   Vaping Use: Never used  Substance and Sexual Activity   Alcohol use: No   Drug use: Yes    Types: Oxycodone   Sexual activity: Not Currently  Other Topics Concern   Not on file  Social History Narrative   Not on file   Social Determinants of Health   Financial Resource Strain: Not on file  Food Insecurity: Not on file  Transportation Needs: Not on file  Physical Activity: Not on file  Stress: Not on file  Social Connections: Not on file    Allergies:  Allergies  Allergen Reactions   Doxycycline    Ibuprofen Nausea Only   Sulfa Antibiotics Nausea Only    Metabolic Disorder Labs: Lab Results  Component Value Date   HGBA1C 6.3 (H) 12/08/2014   No results found for: "PROLACTIN" Lab Results  Component Value Date   CHOL 121 12/08/2014   TRIG 115 12/08/2014   HDL 40 (L) 12/08/2014   CHOLHDL 3.0 12/08/2014   VLDL 23 12/08/2014   LDLCALC 58 12/08/2014   Lab Results  Component Value Date   TSH 2.963 02/14/2022   TSH 1.425 01/27/2019    Therapeutic Level Labs: No results found for: "LITHIUM" No results found for: "VALPROATE" No results found for: "CBMZ"  Current Medications: Current Outpatient Medications  Medication Sig Dispense Refill   acetaminophen (TYLENOL) 500 MG tablet Take by mouth 2 (two) times daily as needed.     allopurinol (ZYLOPRIM) 100 MG tablet TAKE 1 TABLET BY MOUTH EVERY OTHER DAY 45 tablet 1    amLODipine (NORVASC) 10 MG tablet Take 10 mg by mouth daily.     aspirin 81 MG tablet Take 81 mg by mouth daily.     atorvastatin (LIPITOR) 10 MG tablet Take 10 mg by mouth at bedtime.     fluticasone (FLONASE) 50 MCG/ACT nasal spray Place into both  nostrils.     gabapentin (NEURONTIN) 600 MG tablet TAKE 1 TABLET BY MOUTH TWICE DAILY. 180 tablet 0   isosorbide mononitrate (IMDUR) 30 MG 24 hr tablet Take 1 tablet (30 mg total) by mouth daily. 90 tablet 0   levothyroxine (SYNTHROID, LEVOTHROID) 125 MCG tablet Take 125 mcg by mouth daily before breakfast.  0   losartan (COZAAR) 100 MG tablet TAKE 1 TABLET BY MOUTH EVERY DAY 90 tablet 1   naloxone (NARCAN) nasal spray 4 mg/0.1 mL Place 1 spray into the nose as needed for up to 365 doses (for opioid-induced respiratory depresssion). In case of emergency (overdose), spray once into each nostril. If no response within 3 minutes, repeat application and call [id number]. 1 each 0   ondansetron (ZOFRAN ODT) 4 MG disintegrating tablet Take 1 tablet (4 mg total) by mouth every 8 (eight) hours as needed for nausea or vomiting. 20 tablet 0   Oxycodone HCl 10 MG TABS Take 1 tablet (10 mg total) by mouth every 6 (six) hours as needed. Must last 30 days. 120 tablet 0   [START ON 05/10/2022] Oxycodone HCl 10 MG TABS Take 1 tablet (10 mg total) by mouth every 6 (six) hours as needed. Must last 30 days. 120 tablet 0   tamsulosin (FLOMAX) 0.4 MG CAPS capsule Take 0.4 mg by mouth daily.     Testosterone 40.5 MG/2.5GM (1.62%) GEL Place 2 Pump onto the skin 3 (three) times a week.     vitamin B-12 (CYANOCOBALAMIN) 1000 MCG tablet Take 1,000 mcg by mouth daily.     Vitamin D, Ergocalciferol, (DRISDOL) 1.25 MG (50000 UNIT) CAPS capsule TAKE 1 CAPSULE BY MOUTH ONE TIME PER WEEK 4 capsule 2   vitamin E 400 UNIT capsule Take 400 Units by mouth daily.     Oxycodone HCl 10 MG TABS Take 1 tablet (10 mg total) by mouth every 6 (six) hours as needed. Must last 30 days. 120 tablet 0    pantoprazole (PROTONIX) 40 MG tablet Take 40 mg by mouth daily.      sertraline (ZOLOFT) 50 MG tablet Take 1 tablet (50 mg total) by mouth at bedtime. 30 tablet 1   No current facility-administered medications for this visit.     Musculoskeletal: Strength & Muscle Tone: within normal limits Gait & Station:  uses a walker Patient leans: N/A  Psychiatric Specialty Exam: Review of Systems  Psychiatric/Behavioral:  Positive for decreased concentration and dysphoric mood. Negative for agitation, behavioral problems, confusion, hallucinations, self-injury, sleep disturbance and suicidal ideas. The patient is nervous/anxious. The patient is not hyperactive.   All other systems reviewed and are negative.   Blood pressure 124/69, pulse 69, temperature 98.1 F (36.7 C), temperature source Skin, height 6' (1.829 m), weight 201 lb 14.4 oz (91.6 kg).Body mass index is 27.38 kg/m.  General Appearance: Fairly Groomed  Eye Contact:  Fair  Speech:  Clear and Coherent  Volume:  Normal  Mood:  Depressed  Affect:  Appropriate, Congruent, and restricted  Thought Process:  Coherent  Orientation:  Full (Time, Place, and Person)  Thought Content: Logical   Suicidal Thoughts:  No  Homicidal Thoughts:  No  Memory:  Immediate;   Good  Judgement:  Good  Insight:  Good  Psychomotor Activity:  Normal  Concentration:  Concentration: Good and Attention Span: Good  Recall:  Good  Fund of Knowledge: Good  Language: Good  Akathisia:  No  Handed:  Right  AIMS (if indicated): not done  Assets:  Communication  Skills Desire for Improvement  ADL's:  Intact  Cognition: WNL  Sleep:  Good   Screenings: GAD-7    Flowsheet Row Office Visit from 05/09/2022 in [facility] from 05/08/2022 in [place] [facility] from 04/11/2022 in [place] [facility] from 03/29/2022 in [place] Office Visit from 03/09/2022 in [place]  Total GAD-7 Score 13 12 12 16 18       PHQ2-9    [place] Office Visit from 05/09/2022 in [place] [facility] from 05/08/2022 in [place] [facility] from 04/11/2022 in [place] [facility] from 03/29/2022 in [place] Procedure visit from 03/16/2022 in [facility] Total Score 6 6 5 4  0  PHQ-9 Total Score 20 16 15 18  --      Flowsheet Row Counselor from 05/08/2022 in [place] [facility] from 04/11/2022 in [place] [facility] from 03/29/2022 in [place] No Risk No Risk No Risk        Assessment and Plan:  KYUS ZERKEL is a 88 y.o. year old male with a history of chronic low back pain, diabetes, GERD, hypertension, hypothyroidism, MI, who presents for follow up appointment for below.   1. MDD (major depressive disorder), recurrent episode, moderate Acute stressors include: diagnosed with legally blind  Other stressors include: declining in health, including hearing/visual loss, using a walker, loss of his family members, team members in the [employer]    History:   There has been slight improvement in depressive symptoms since switching from duloxetine to sertraline.  Will do further uptitration of sertraline to optimize treatment for depression and anxiety.  Discussed potential risk of GI symptoms and drowsiness.  He will greatly benefit from CBT; he will continue to see a therapist.  Coached behavioral activation.   2. Neurocognitive disorder Functional Status   IADL: Independent in the  following:            Requires assistance with the following: managing finances, medications (his wife arranges in pill box)  , driving ADL  Independent in the following: bathing (uses shower chair), and hygiene, feeding, continence, grooming and toileting, walking (walker)           Requires assistance with the following: Folate, Vtamin B12, TSH wnl 02/2022 Images Head CT 11.2021  Brain: Age related volume loss. No focal abnormality affects the brainstem or cerebellum. Cerebral hemispheres show minimal small vessel change of the deep white matter. No cortical or large vessel territory infarction. No mass lesion, hemorrhage, hydrocephalus or extra-axial collection. After contrast administration, no abnormal enhancement occurs. Neuropsych assessment: [facility] 5/30 02/2022 Etiology:   r/o VaD, MDD  Unchanged.  Will continue to assess as needed.      Plan  Increase sertraline 50 mg at night Next appointment: 5/30 at 3:30 for 30 mins, IP   Past trials of medication: risperidone    The patient demonstrates the following risk factors for suicide: Chronic risk factors for suicide include: psychiatric disorder of depression . Acute risk factors for suicide include: unemployment and loss (financial, interpersonal, professional). Protective factors for this patient include: positive social support and hope for the future. Considering these factors, the overall suicide risk  at this point appears to be low. Patient is appropriate for outpatient follow up.    Collaboration of Care: Collaboration of Care: Other reviewed notes in Epic  Patient/Guardian was advised Release of Information must be obtained prior to any record release in order to collaborate their care with an outside provider. Patient/Guardian was advised if they have not already done so to contact the registration department to sign all necessary forms in order for [place] to release information regarding their care.   Consent: Patient/Guardian gives  verbal consent for treatment and assignment of benefits for services provided during this visit. Patient/Guardian expressed understanding and agreed to proceed.    Norman Clay, MD 05/09/2022, 3:14 PM

## 2022-05-08 ENCOUNTER — Ambulatory Visit (INDEPENDENT_AMBULATORY_CARE_PROVIDER_SITE_OTHER): Payer: Worker's Compensation | Admitting: Licensed Clinical Social Worker

## 2022-05-08 DIAGNOSIS — F331 Major depressive disorder, recurrent, moderate: Secondary | ICD-10-CM | POA: Diagnosis not present

## 2022-05-08 DIAGNOSIS — R69 Illness, unspecified: Secondary | ICD-10-CM | POA: Diagnosis not present

## 2022-05-09 ENCOUNTER — Encounter: Payer: Self-pay | Admitting: Psychiatry

## 2022-05-09 ENCOUNTER — Ambulatory Visit (INDEPENDENT_AMBULATORY_CARE_PROVIDER_SITE_OTHER): Payer: PRIVATE HEALTH INSURANCE | Admitting: Psychiatry

## 2022-05-09 VITALS — BP 124/69 | HR 69 | Temp 98.1°F | Ht 72.0 in | Wt 201.9 lb

## 2022-05-09 DIAGNOSIS — F331 Major depressive disorder, recurrent, moderate: Secondary | ICD-10-CM

## 2022-05-09 DIAGNOSIS — K219 Gastro-esophageal reflux disease without esophagitis: Secondary | ICD-10-CM | POA: Diagnosis not present

## 2022-05-09 DIAGNOSIS — R419 Unspecified symptoms and signs involving cognitive functions and awareness: Secondary | ICD-10-CM | POA: Diagnosis not present

## 2022-05-09 DIAGNOSIS — K5909 Other constipation: Secondary | ICD-10-CM | POA: Diagnosis not present

## 2022-05-09 DIAGNOSIS — R69 Illness, unspecified: Secondary | ICD-10-CM | POA: Diagnosis not present

## 2022-05-09 DIAGNOSIS — R110 Nausea: Secondary | ICD-10-CM | POA: Diagnosis not present

## 2022-05-09 MED ORDER — SERTRALINE HCL 50 MG PO TABS: 50.0000 mg | ORAL_TABLET | Freq: Every day | ORAL | 1 refills | Status: DC

## 2022-05-09 NOTE — Patient Instructions (Signed)
Increase sertraline 50 mg at night Next appointment: 5/30 at 3:30

## 2022-05-10 NOTE — Progress Notes (Unsigned)
THERAPIST PROGRESS NOTE  Session Time: 10:04AM-11:00AM  Participation Level: Active  Behavioral Response: Casual, Neat, and Well GroomedAlertDepressed  Type of Therapy: Individual Therapy  Treatment Goals addressed:  Reduce overall frequency, intensity and duration of anxiety so that daily functioning is not impaired per pt self report 3 out of 5 sessions.   Reduce overall frequency, intensity and duration of depression so that daily functioning is not impaired per pt self report 3 out of 5 sessions documented.   ProgressTowards Goals: Progressing  Interventions: CBT, DBT, Motivational Interviewing, Solution Focused, and Other: ACT  Summary: Chase Mendez is a 88 y.o. male who presents with continued sxs of MDD including but not limited to lack of interest, lack of motivation, depressed mood, negative self affect, and feelings of hopelessness. Pt oriented to person, place, and time. Pt denies SI/HI or A/V hallucinations. Pt was cooperative during visit and was engaged throughout the visit. Pt does not report any other concerns at the time of visit.  Pt reported increase in depressed mood due to being diagnosed legally blind. Pt identified next steps he plans to take with providers to assist in potential tx. Pt reported feeling hopeless. Provided pt processing space. Discussed concept of having a grief response to loss of vision.    Pt reported looking forward to daughter coming to visit today. Pt reported feeling down due to not seeing his son and grateful to continue to have his daughter present.   Pt reported improvements in practicing DBT distress tolerance skill of opposite action when facing debilitating sxs. Pt identified how they discerned need to utilize opposite action and explored ways they can and have practiced opposite action to assist in combating depressive sxs. Pt reported that he attended [employer] and one other meeting that he had not gone to in several months.  Pt identified feeling anxious beforehand. Provided psychoed re: anticipation and how depression and anxiety utilize anticipation to keep pt in their comfort zone. Invited pt to focus on feeling of reward that comes with completing task and educate depression that this will feel uncomfortable until it becomes routine again. Pt reported feeling good while in attendance of meetings.   Pt reported increased nausea. Encouraged pt to discuss physical concerns with psychiatrist and other med providers. Pt identified barrier to activity that nausea poses in his day to day life. Discussed balance of practicing opposite action while listening to body's needs.   Pt utilized therapeutic space to process "should" statements related to his past. Discussed "should" statements being a cognitive distortion inspired by anxious sxs. Explored idea that pt had more than one correct path to walk down in life. Provided pt space to follow thought and discuss pros and cons of different life paths. Pt engaged in identifying positives of his current life path while also grieving what could have been. Pt reported biggest pro of his life path being meeting his wife. Invited pt to practice matching criticism with gratitude. Taught pt good, bad, gratitude processing exercise to assist pt in viewing all sides of situation or circumstance being processed. Stressed importance of ending with gratitude as gratitude can serve as a form of closure during processing work.   Pt processed grief of his contemporaries and his hobbies that he can no longer engage in. Discussed importance of having a support system with a wide age range. Encouraged pt to continue to attend meetings and connect with contemporaries as no one else will understand life changes pt is processing like his  contemporaries. Discussed concept of community in grief.  LCSW provided mood monitoring and treatment progress review in the context of this episode of treatment. LCSW  reviewed the pt's mood status since last session.   Pt is continuing to apply interventions/techniques learned in session into daily life situations. Pt is currently on track to meet goals utilizing interventions that are discussed in session. Treatment to continue as indicated. Personal growth and progress toward goals noted above.  Continued Recommendations as followed: Self-care behaviors, positive social engagements, focusing on positive physical and emotional wellness, and focusing on life/work balance.   Suicidal/Homicidal: Nowithout intent/plan  Therapist Response:  Provided pt education re: acceptance. Discussed how to make acceptance accessible at all parts of pt's healing journey.   LCSW practiced active listening to validate pt participation, build rapport, and create safe space for pt to feel heard as they are disclosing their thoughts and feelings.   Approached pt with strengths based perspective to assist pt in exploring strengths in moments of feeling low.   LCSW utilized therapeutic conversation skills informed by CBT, DBT, and ACT to expose pt to multiple ways of thinking about healing and to provide pt to access to multiple interventions.  Taught pt good, bad, gratitude processing exercise to assist pt in viewing all sides of situation or circumstance being processed. Stressed importance of ending with gratitude as gratitude can serve as a form of closure during processing work.   Educated pt on DBT skill Radical Acceptance. Pt reported improvements in utilizing distress tolerance skill of radical acceptance. Pt identified ways they have exercised control and how they discerned radical acceptance to be a necessary skill for their situation. Pt shared barriers to radical acceptance and discussed ways they plan to overcome those barriers in order to best utilize radical acceptance.    Plan: Return again in 3 weeks.  Diagnosis: MDD (major depressive disorder), recurrent episode,  moderate    05/09/2022    3:10 PM 05/08/2022   11:05 AM 04/11/2022   11:39 AM 03/29/2022    5:16 PM  GAD 7 : Generalized Anxiety Score  Nervous, Anxious, on Edge 2 3 2 1   Control/stop worrying 2 2 2 3   Worry too much - different things 3 2 2 3   Trouble relaxing 2 1 1 2   Restless 2 1 1 1   Easily annoyed or irritable 2 1 2 3   Afraid - awful might happen 0 2 2 3   Total GAD 7 Score 13 12 12 16   Anxiety Difficulty Very difficult Very difficult Extremely difficult Extremely difficult       05/09/2022    3:10 PM 05/08/2022   11:05 AM 04/11/2022   11:39 AM  Depression screen PHQ 2/9  Decreased Interest 3 3 2   Down, Depressed, Hopeless 3 3 3   PHQ - 2 Score 6 6 5   Altered sleeping 2 1 1   Tired, decreased energy 2 3 1   Change in appetite 3 2 0  Feeling bad or failure about yourself  2 2 3   Trouble concentrating 2 0 3  Moving slowly or fidgety/restless 3 2 2   Suicidal thoughts 0 0 0  PHQ-9 Score 20 16 15   Difficult doing work/chores Very difficult Very difficult Extremely dIfficult    Collaboration of Care: Psychiatrist AEB Dr. Modesta Messing  Patient/Guardian was advised Release of Information must be obtained prior to any record release in order to collaborate their care with an outside provider. Patient/Guardian was advised if they have not already done so to contact  the registration department to sign all necessary forms in order for [place] to release information regarding their care.   Consent: Patient/Guardian gives verbal consent for treatment and assignment of benefits for services provided during this visit. Patient/Guardian expressed understanding and agreed to proceed.   Blair Dolphin, LCSW 05/10/2022

## 2022-05-11 DIAGNOSIS — H903 Sensorineural hearing loss, bilateral: Secondary | ICD-10-CM | POA: Diagnosis not present

## 2022-05-11 DIAGNOSIS — H6123 Impacted cerumen, bilateral: Secondary | ICD-10-CM | POA: Diagnosis not present

## 2022-05-12 ENCOUNTER — Ambulatory Visit: Payer: Medicare HMO | Admitting: Internal Medicine

## 2022-05-18 ENCOUNTER — Encounter: Payer: Self-pay | Admitting: Internal Medicine

## 2022-05-29 ENCOUNTER — Ambulatory Visit (INDEPENDENT_AMBULATORY_CARE_PROVIDER_SITE_OTHER): Payer: Worker's Compensation | Admitting: Licensed Clinical Social Worker

## 2022-05-29 DIAGNOSIS — F331 Major depressive disorder, recurrent, moderate: Secondary | ICD-10-CM | POA: Diagnosis not present

## 2022-05-30 ENCOUNTER — Other Ambulatory Visit: Payer: Self-pay | Admitting: Internal Medicine

## 2022-05-30 DIAGNOSIS — E785 Hyperlipidemia, unspecified: Secondary | ICD-10-CM

## 2022-05-30 NOTE — Progress Notes (Unsigned)
PROVIDER NOTE: Information contained herein reflects review and annotations entered in association with encounter. Interpretation of such information and data should be left to medically-trained personnel. Information provided to patient can be located elsewhere in the medical record under "Patient Instructions". Document created using STT-dictation technology, any transcriptional errors that may result from process are unintentional.    Patient: Chase Mendez  Service Category: E/M  Provider: Oswaldo Done, MD  DOB: [birth year]  DOS: 05/31/2022  Referring Provider: Sherron Monday, MD  MRN: [id number]  Specialty: Interventional Pain Management  PCP: Chase Monday, MD  Type: Established Patient  Setting: Ambulatory outpatient    Location: Office  Delivery: Face-to-face     HPI  Chase Mendez, a 87 y.o. year old male, is here today because of his Chronic pain syndrome [G89.4]. Mr. Keep primary complain today is No chief complaint on file.  Pertinent problems: [name] has Spinal accessory neuropathy; Lower limb pain, inferior (L5) (Foot Drop) (Left); Chronic low back pain (2ry area of Pain) (Bilateral) (ML) (L>R) w/ sciatica (Left); Chronic lower extremity pain (1ry area of Pain) (Left); Failed back surgical syndrome; Foot drop (Left); Lumbosacral radiculopathy at L5 (Left); DDD (degenerative disc disease), lumbar; Chronic pain syndrome; Thoracic central spinal stenosis; Chronic musculoskeletal pain; Neurogenic pain; Presence of neurostimulator; Gout of left foot due to renal impairment; Disturbance of skin sensation; Cervicalgia; Cervical facet syndrome; Bilateral lower abdominal pain; Abnormal MRI, thoracic spine (02/12/2018); DDD (degenerative disc disease), cervical; Cervicogenic headache; Painful cervical range of motion; Impaired range of motion of cervical spine; and Spondylosis without myelopathy or radiculopathy, cervical region on their pertinent problem list. Pain  Assessment: Severity of   is reported as a  /10. Location:    / . Onset:  . Quality:  . Timing:  . Modifying factor(s):  [name] Vitals:  vitals were not taken for this visit.  BMI: Estimated body mass index is 27.38 kg/m as calculated from the following:   Height as of 05/09/22: 6' (1.829 m).   Weight as of 05/09/22: 201 lb 14.4 oz (91.6 kg). Last encounter: 04/04/2022. Last procedure: 03/16/2022.  Reason for encounter: medication management. ***  RTCB: 09/07/2022   Pharmacotherapy Assessment  Analgesic: Oxycodone IR 10 mg, 1 tab PO q 6 hrs. (40 mg/day of oxycodone) MME/day: 60 mg/day.   Monitoring: [place] PMP: PDMP reviewed during this encounter.       Pharmacotherapy: No side-effects or adverse reactions reported. Compliance: No problems identified. Effectiveness: Clinically acceptable.  No notes on file  No results found for: "CBDTHCR" No results found for: "D8THCCBX" No results found for: "D9THCCBX"  UDS:  Summary  Date Value Ref Range Status  08/03/2021 Note  Final    Comment:    ==================================================================== [employer] [facility] (MW) ==================================================================== Test                             Result       Flag       Units  Drug Present and Declared for Prescription Verification   Oxycodone                      3770         EXPECTED   ng/mg creat   Oxymorphone                    1689         EXPECTED  ng/mg creat   Noroxycodone                   4295         EXPECTED   ng/mg creat   Noroxymorphone                 909          EXPECTED   ng/mg creat    Sources of oxycodone are scheduled prescription medications.    Oxymorphone, noroxycodone, and noroxymorphone are expected    metabolites of oxycodone. Oxymorphone is also available as a    scheduled prescription medication.  ==================================================================== Test                      Result    Flag   Units      Ref  Range   Creatinine              148              mg/dL      >=16 ==================================================================== Declared Medications:  The flagging and interpretation on this report are based on the  following declared medications.  Unexpected results may arise from  inaccuracies in the declared medications.   **Note: The testing scope of this panel includes these medications:   Oxycodone   **Note: The testing scope of this panel does not include the  following reported medications:   Allopurinol (Zyloprim)  Amlodipine (Norvasc)  Aspirin  Atorvastatin (Lipitor)  Cyanocobalamin  Diphenhydramine (Benadryl)  Duloxetine (Cymbalta)  Gabapentin (Neurontin)  Isosorbide (Imdur)  Levothyroxine (Synthroid)  Losartan (Cozaar)  Ondansetron (Zofran)  Pantoprazole (Protonix)  Tamsulosin (Flomax)  Testosterone  Vitamin E ==================================================================== For clinical consultation, please call [phone]. ====================================================================       ROS  Constitutional: Denies any fever or chills Gastrointestinal: No reported hemesis, hematochezia, vomiting, or acute GI distress Musculoskeletal: Denies any acute onset joint swelling, redness, loss of ROM, or weakness Neurological: No reported episodes of acute onset apraxia, aphasia, dysarthria, agnosia, amnesia, paralysis, loss of coordination, or loss of consciousness  Medication Review  Oxycodone HCl, Testosterone, Vitamin D (Ergocalciferol), acetaminophen, allopurinol, amLODipine, aspirin, atorvastatin, cyanocobalamin, diphenhydrAMINE, fluticasone, gabapentin, isosorbide mononitrate, levothyroxine, losartan, naloxone, ondansetron, pantoprazole, sertraline, tamsulosin, and vitamin E  History Review  Allergy: [name] is allergic to doxycycline, ibuprofen, and sulfa antibiotics. Drug: [name]  reports current drug use. Drug:  Oxycodone. Alcohol:  reports no history of alcohol use. Tobacco:  reports that he has been smoking cigars and cigarettes. He has a 35.00 pack-year smoking history. He has never used smokeless tobacco. Social: [name]  reports that he has been smoking cigars and cigarettes. He has a 35.00 pack-year smoking history. He has never used smokeless tobacco. He reports current drug use. Drug: Oxycodone. He reports that he does not drink alcohol. Medical:  has a past medical history of Acute encephalopathy (12/08/2014), Anxiety, ARF (acute renal failure) (12/08/2014), Back pain, Benign neoplasm of large bowel, Capsulitis, Chronic back pain, Coronavirus infection, Depression, Diabetes mellitus without complication, Dysphagia, Exostosis, Foot drop, left, Frequent falls (02/2018), GERD (gastroesophageal reflux disease), Gout, Hypertension, Hypothyroidism, Insomnia, Low testosterone, Microscopic hematuria (2016), Myocardial infarction, Pneumonia (12/07/2014), Pressure ulcer (12/09/2014), Sepsis (12/08/2014), and Thyroid disease. Surgical: Mr. Lightner  has a past surgical history that includes Cholecystectomy; Esophagogastroduodenoscopy (egd) with propofol (N/A, 04/24/2017); Back surgery (1995, 1996); Eye surgery (Bilateral, 1983, 1985); Appendectomy; and Spinal cord stimulator insertion (N/A, 03/13/2018). Family: family history includes Diabetes in his  father; Heart disease in his mother; Kidney cancer in his brother.  Laboratory Chemistry Profile   Renal Lab Results  Component Value Date   BUN 23 02/14/2022   CREATININE 1.42 (H) 02/14/2022   GFRAA >60 01/28/2019   GFRNONAA 48 (L) 02/14/2022    Hepatic Lab Results  Component Value Date   AST 22 02/14/2022   ALT 13 02/14/2022   ALBUMIN 4.1 02/14/2022   ALKPHOS 90 02/14/2022   HCVAB <0.1 07/24/2018   LIPASE 37 08/21/2016    Electrolytes Lab Results  Component Value Date   NA 139 02/14/2022   K 4.4 02/14/2022   CL 102 02/14/2022   CALCIUM 9.0 02/14/2022     Bone No results found for: "VD25OH", "VD125OH2TOT", "QM5784ON6", "EX5284XL2", "25OHVITD1", "25OHVITD2", "25OHVITD3", "TESTOFREE", "TESTOSTERONE"  Inflammation (CRP: Acute Phase) (ESR: Chronic Phase) Lab Results  Component Value Date   LATICACIDVEN 1.1 12/08/2014         Note: Above Lab results reviewed.  Recent Imaging Review  DG PAIN CLINIC C-ARM 1-60 MIN NO REPORT Fluoro was used, but no Radiologist interpretation will be provided.  Please refer to "NOTES" tab for provider progress note. Note: Reviewed        Physical Exam  General appearance: Well nourished, well developed, and well hydrated. In no apparent acute distress Mental status: Alert, oriented x 3 (person, place, & time)       Respiratory: No evidence of acute respiratory distress Eyes: PERLA Vitals: There were no vitals taken for this visit. BMI: Estimated body mass index is 27.38 kg/m as calculated from the following:   Height as of 05/09/22: 6' (1.829 m).   Weight as of 05/09/22: 201 lb 14.4 oz (91.6 kg). Ideal: Patient weight not recorded  Assessment   Diagnosis Status  1. Chronic pain syndrome   2. Chronic lower extremity pain (1ry area of Pain) (Left)   3. Chronic low back pain (2ry area of Pain) (Bilateral) (ML) (L>R) w/ sciatica (Left)   4. Lumbosacral radiculopathy at L5 (Left)   5. DDD (degenerative disc disease), lumbar   6. Failed back surgical syndrome   7. Pharmacologic therapy   8. Chronic use of opiate for therapeutic purpose   9. Encounter for medication management   10. Encounter for chronic pain management    Controlled Controlled Controlled   Updated Problems: No problems updated.  Plan of Care  Problem-specific:  No problem-specific Assessment & Plan notes found for this encounter.  Mr. AIDYN KELLIS has a current medication list which includes the following long-term medication(s): allopurinol, amlodipine, gabapentin, isosorbide mononitrate, levothyroxine, losartan, oxycodone  hcl, oxycodone hcl, pantoprazole, sertraline, testosterone, and [DISCONTINUED] diphenhydramine.  Pharmacotherapy (Medications Ordered): No orders of the defined types were placed in this encounter.  Orders:  No orders of the defined types were placed in this encounter.  Follow-up plan:   No follow-ups on file.      Interventional Therapies  Risk  Complexity Considerations:   WNL   Planned  Pending:      Under consideration:   Therapeutic bilateral cervical facet MB RFA #1    Completed:   Diagnostic/therapeutic bilateral cervical facet MBB x2 (03/16/2022) (1st:100/100/100/100) (2nd: 100/100/100/20) Diagnostic/therapeutic left caudal ESI + epidurogram x1 (11/17/2021) (100/100/25/25)    Therapeutic  Palliative (PRN) options:   Therapeutic/palliative left L4 TFESI #2  Therapeutic/palliative left L5 TFESI #2  Bilateral spinal cord stimulator trial (Mendez - 01/17/2018)  Permanent bilateral spinal cord stimulator implant by Dr. Marcell Barlow (neurosurgery) (Mendez - 03/13/2018)  Pharmacotherapy  Nonopioids transferred 01/12/2020: Gabapentin        Recent Visits Date Type Provider Dept  04/04/22 Office Visit Delano Metz, MD [facility]  03/16/22 Procedure visit Delano Metz, MD [facility]  03/06/22 Office Visit Delano Metz, MD [facility]  Showing recent visits within past 90 days and meeting all other requirements Future Appointments Date Type Provider Dept  05/31/22 Appointment Delano Metz, MD [facility]  Showing future appointments within next 90 days and meeting all other requirements  I discussed the assessment and treatment plan with the patient. The patient was provided an opportunity to ask questions and all were answered. The patient agreed with the plan and demonstrated an understanding of the instructions.  Patient advised to call back or seek an in-person evaluation if the symptoms or condition  worsens.  Duration of encounter: *** minutes.  Total time on encounter, as per AMA guidelines included both the face-to-face and non-face-to-face time personally spent by the physician and/or other qualified health care professional(s) on the day of the encounter (includes time in activities that require the physician or other qualified health care professional and does not include time in activities normally performed by clinical staff). Physician's time may include the following activities when performed: Preparing to see the patient (e.g., pre-charting review of records, searching for previously ordered imaging, lab work, and nerve conduction tests) Review of prior analgesic pharmacotherapies. Reviewing PMP Interpreting ordered tests (e.g., lab work, imaging, nerve conduction tests) Performing post-procedure evaluations, including interpretation of diagnostic procedures Obtaining and/or reviewing separately obtained history Performing a medically appropriate examination and/or evaluation Counseling and educating the patient/family/caregiver Ordering medications, tests, or procedures Referring and communicating with other health care professionals (when not separately reported) Documenting clinical information in the electronic or other health record Independently interpreting results (not separately reported) and communicating results to the patient/ family/caregiver Care coordination (not separately reported)  Note by: Chase Done, MD Date: 05/31/2022; Time: 4:04 PM

## 2022-05-30 NOTE — Patient Instructions (Signed)
____________________________________________________________________________________________  Opioid Pain Medication Update  To: All patients taking opioid pain medications. (I.e.: hydrocodone, hydromorphone, oxycodone, oxymorphone, morphine, codeine, methadone, tapentadol, tramadol, buprenorphine, fentanyl, etc.)  Re: Updated review of side effects and adverse reactions of opioid analgesics, as well as new information about long term effects of this class of medications.  Direct risks of long-term opioid therapy are not limited to opioid addiction and overdose. Potential medical risks include serious fractures, breathing problems during sleep, hyperalgesia, immunosuppression, chronic constipation, bowel obstruction, myocardial infarction, and tooth decay secondary to xerostomia.  Unpredictable adverse effects that can occur even if you take your medication correctly: Cognitive impairment, respiratory depression, and death. Most people think that if they take their medication "correctly", and "as instructed", that they will be safe. Nothing could be farther from the truth. In reality, a significant amount of recorded deaths associated with the use of opioids has occurred in individuals that had taken the medication for a long time, and were taking their medication correctly. The following are examples of how this can happen: Patient taking his/her medication for a long time, as instructed, without any side effects, is given a certain antibiotic or another unrelated medication, which in turn triggers a "Drug-to-drug interaction" leading to disorientation, cognitive impairment, impaired reflexes, respiratory depression or an untoward event leading to serious bodily harm or injury, including death.  Patient taking his/her medication for a long time, as instructed, without any side effects, develops an acute impairment of liver and/or kidney function. This will lead to a rapid inability of the body to  breakdown and eliminate their pain medication, which will result in effects similar to an "overdose", but with the same medicine and dose that they had always taken. This again may lead to disorientation, cognitive impairment, impaired reflexes, respiratory depression or an untoward event leading to serious bodily harm or injury, including death.  A similar problem will occur with patients as they grow older and their liver and kidney function begins to decrease as part of the aging process.  Background information: Historically, the original case for using long-term opioid therapy to treat chronic noncancer pain was based on safety assumptions that subsequent experience has called into question. In 1996, the American Pain Society and the American Academy of Pain Medicine issued a consensus statement supporting long-term opioid therapy. This statement acknowledged the dangers of opioid prescribing but concluded that the risk for addiction was low; respiratory depression induced by opioids was short-lived, occurred mainly in opioid-naive patients, and was antagonized by pain; tolerance was not a common problem; and efforts to control diversion should not constrain opioid prescribing. This has now proven to be wrong. Experience regarding the risks for opioid addiction, misuse, and overdose in community practice has failed to support these assumptions.  According to the Centers for Disease Control and Prevention, fatal overdoses involving opioid analgesics have increased sharply over the past decade. Currently, more than 96,700 people die from drug overdoses every year. Opioids are a factor in 7 out of every 10 overdose deaths. Deaths from drug overdose have surpassed motor vehicle accidents as the leading cause of death for individuals between the ages of 35 and 54.  Clinical data suggest that neuroendocrine dysfunction may be very common in both men and women, potentially causing hypogonadism, erectile  dysfunction, infertility, decreased libido, osteoporosis, and depression. Recent studies linked higher opioid dose to increased opioid-related mortality. Controlled observational studies reported that long-term opioid therapy may be associated with increased risk for cardiovascular events. Subsequent meta-analysis concluded   that the safety of long-term opioid therapy in elderly patients has not been proven.   Side Effects and adverse reactions: Common side effects: Drowsiness (sedation). Dizziness. Nausea and vomiting. Constipation. Physical dependence -- Dependence often manifests with withdrawal symptoms when opioids are discontinued or decreased. Tolerance -- As you take repeated doses of opioids, you require increased medication to experience the same effect of pain relief. Respiratory depression -- This can occur in healthy people, especially with higher doses. However, people with COPD, asthma or other lung conditions may be even more susceptible to fatal respiratory impairment.  Uncommon side effects: An increased sensitivity to feeling pain and extreme response to pain (hyperalgesia). Chronic use of opioids can lead to this. Delayed gastric emptying (the process by which the contents of your stomach are moved into your small intestine). Muscle rigidity. Immune system and hormonal dysfunction. Quick, involuntary muscle jerks (myoclonus). Arrhythmia. Itchy skin (pruritus). Dry mouth (xerostomia).  Long-term side effects: Chronic constipation. Sleep-disordered breathing (SDB). Increased risk of bone fractures. Hypothalamic-pituitary-adrenal dysregulation. Increased risk of overdose.  RISKS: Fractures and Falls:  Opioids increase the risk and incidence of falls. This is of particular importance in elderly patients.  Endocrine System:  Long-term administration is associated with endocrine abnormalities (endocrinopathies). (Also known as Opioid-induced Endocrinopathy) Influences  on both the hypothalamic-pituitary-adrenal axis?and the hypothalamic-pituitary-gonadal axis have been demonstrated with consequent hypogonadism and adrenal insufficiency in both sexes. Hypogonadism and decreased levels of dehydroepiandrosterone sulfate have been reported in men and women. Endocrine effects include: Amenorrhoea in women (abnormal absence of menstruation) Reduced libido in both sexes Decreased sexual function Erectile dysfunction in men Hypogonadisms (decreased testicular function with shrinkage of testicles) Infertility Depression and fatigue Loss of muscle mass Anxiety Depression Immune suppression Hyperalgesia Weight gain Anemia Osteoporosis Patients (particularly women of childbearing age) should avoid opioids. There is insufficient evidence to recommend routine monitoring of asymptomatic patients taking opioids in the long-term for hormonal deficiencies.  Immune System: Human studies have demonstrated that opioids have an immunomodulating effect. These effects are mediated via opioid receptors both on immune effector cells and in the central nervous system. Opioids have been demonstrated to have adverse effects on antimicrobial response and anti-tumour surveillance. Buprenorphine has been demonstrated to have no impact on immune function.  Opioid Induced Hyperalgesia: Human studies have demonstrated that prolonged use of opioids can lead to a state of abnormal pain sensitivity, sometimes called opioid induced hyperalgesia (OIH). Opioid induced hyperalgesia is not usually seen in the absence of tolerance to opioid analgesia. Clinically, hyperalgesia may be diagnosed if the patient on long-term opioid therapy presents with increased pain. This might be qualitatively and anatomically distinct from pain related to disease progression or to breakthrough pain resulting from development of opioid tolerance. Pain associated with hyperalgesia tends to be more diffuse than the  pre-existing pain and less defined in quality. Management of opioid induced hyperalgesia requires opioid dose reduction.  Cancer: Chronic opioid therapy has been associated with an increased risk of cancer among noncancer patients with chronic pain. This association was more evident in chronic strong opioid users. Chronic opioid consumption causes significant pathological changes in the small intestine and colon. Epidemiological studies have found that there is a link between opium dependence and initiation of gastrointestinal cancers. Cancer is the second leading cause of death after cardiovascular disease. Chronic use of opioids can cause multiple conditions such as GERD, immunosuppression and renal damage as well as carcinogenic effects, which are associated with the incidence of cancers.   Mortality: Long-term opioid use   has been associated with increased mortality among patients with chronic non-cancer pain (CNCP).  Prescription of long-acting opioids for chronic noncancer pain was associated with a significantly increased risk of all-cause mortality, including deaths from causes other than overdose.  Reference: Von Korff M, Kolodny A, Deyo RA, Chou R. Long-term opioid therapy reconsidered. Ann Intern Med. 2011 Sep 6;155(5):325-8. doi: 10.7326/[id number]. PMID: 21893626; PMCID: PMC3280085. Bedson J, Chen Y, Ashworth J, Hayward RA, Dunn KM, Jordan KP. Risk of adverse events in patients prescribed long-term opioids: A cohort study in the [place] Clinical Practice Research Datalink. Eur J Pain. 2019 May;23(5):908-922. doi: 10.1002/ejp.1357. Epub 2019 Jan 31. PMID: 30620116. Colameco S, Coren JS, Ciervo CA. Continuous opioid treatment for chronic noncancer pain: a time for moderation in prescribing. Postgrad Med. 2009 Jul;121(4):61-6. doi: 10.3810/pgm.2009.07.2032. PMID: 19641271. Chou R, Turner JA, Devine EB, Hansen RN, Sullivan SD, Blazina I, Dana T, Bougatsos C, Deyo RA. The  effectiveness and risks of long-term opioid therapy for chronic pain: a systematic review for a National Institutes of Health Pathways to Prevention Workshop. Ann Intern Med. 2015 Feb 17;162(4):276-86. doi: 10.7326/M14-2559. PMID: 25581257. Warner M, Chen LH, Makuc DM. NCHS Data Brief No. 22. [place]: Centers for Disease Control and Prevention; 2009. Sep, Increase in Fatal Poisonings Involving Opioid Analgesics in the [place], 1999-2006. Song IA, Choi HR, Oh TK. Long-term opioid use and mortality in patients with chronic non-cancer pain: Ten-year follow-up study in [place] from 2010 through 2019. EClinicalMedicine. 2022 Jul 18;51:101558. doi: 10.1016/j.eclinm.2022.101558. PMID: 35875817; PMCID: PMC9304910. Huser, W., Schubert, T., Vogelmann, T. et al. All-cause mortality in patients with long-term opioid therapy compared with non-opioid analgesics for chronic non-cancer pain: a database study. BMC Med 18, 162 (2020). https://doi.org/10.1186/[email or web address] Rashidian H, Zendehdel K, Kamangar F, Malekzadeh R, Haghdoost AA. An Ecological Study of the Association between Opiate Use and Incidence of Cancers. Addict Health. 2016 Fall;8(4):252-260. PMID: 28819556; PMCID: PMC5554805.  Our Goal: Our goal is to control your pain with means other than the use of opioid pain medications.  Our Recommendation: Talk to your physician about coming off of these medications. We can assist you with the tapering down and stopping these medicines. Based on the new information, even if you cannot completely stop the medication, a decrease in the dose may be associated with a lesser risk. Ask for other means of controlling the pain. Decrease or eliminate those factors that significantly contribute to your pain such as smoking, obesity, and a diet heavily tilted towards "inflammatory" nutrients.  Last Updated: 04/05/2022    ____________________________________________________________________________________________     ____________________________________________________________________________________________  Patient Information update  To: All of our patients.  Re: Name change.  It has been made official that our current name, "[facility]"   will soon be changed to "[facility]".   The purpose of this change is to eliminate any confusion created by the concept of our practice being a "Medication Management Pain Clinic". In the past this has led to the misconception that we treat pain primarily by the use of prescription medications.  Nothing can be farther from the truth.   Understanding PAIN MANAGEMENT: To further understand what our practice does, you first have to understand that "Pain Management" is a subspecialty that requires additional training once a physician has completed their specialty training, which can be in either Anesthesia, Neurology, Psychiatry, or Physical Medicine and Rehabilitation (PMR). Each one of these contributes to the final approach taken by each physician to   the management of their patient's pain. To be a "Pain Management Specialist" you must have first completed one of the specialty trainings below.  Anesthesiologists - trained in clinical pharmacology and interventional techniques such as nerve blockade and regional as well as central neuroanatomy. They are trained to block pain before, during, and after surgical interventions.  Neurologists - trained in the diagnosis and pharmacological treatment of complex neurological conditions, such as Multiple Sclerosis, Parkinson's, spinal cord injuries, and other systemic conditions that may be associated with symptoms that may include but are not limited to pain. They tend to rely primarily on the treatment of chronic pain  using prescription medications.  Psychiatrist - trained in conditions affecting the psychosocial wellbeing of patients including but not limited to depression, anxiety, schizophrenia, personality disorders, addiction, and other substance use disorders that may be associated with chronic pain. They tend to rely primarily on the treatment of chronic pain using prescription medications.   Physical Medicine and Rehabilitation (PMR) physicians, also known as physiatrists - trained to treat a wide variety of medical conditions affecting the brain, spinal cord, nerves, bones, joints, ligaments, muscles, and tendons. Their training is primarily aimed at treating patients that have suffered injuries that have caused severe physical impairment. Their training is primarily aimed at the physical therapy and rehabilitation of those patients. They may also work alongside orthopedic surgeons or neurosurgeons using their expertise in assisting surgical patients to recover after their surgeries.  INTERVENTIONAL PAIN MANAGEMENT is sub-subspecialty of Pain Management.  Our physicians are Board-certified in Anesthesia, Pain Management, and Interventional Pain Management.  This meaning that not only have they been trained and Board-certified in their specialty of Anesthesia, and subspecialty of Pain Management, but they have also received further training in the sub-subspecialty of Interventional Pain Management, in order to become Board-certified as INTERVENTIONAL PAIN MANAGEMENT SPECIALIST.    Mission: Our goal is to use our skills in  INTERVENTIONAL PAIN MANAGEMENT as alternatives to the chronic use of prescription opioid medications for the treatment of pain. To make this more clear, we have changed our name to reflect what we do and offer. We will continue to offer medication management assessment and recommendations, but we will not be taking over any patient's medication  management.  ____________________________________________________________________________________________     ____________________________________________________________________________________________  National Pain Medication Shortage  The [place] is experiencing worsening drug shortages. These have had a negative widespread effect on patient care and treatment. Not expected to improve any time soon. Predicted to last past 2029.   Drug shortage list (generic names) Oxycodone IR Oxycodone/APAP Oxymorphone IR Hydromorphone Hydrocodone/APAP Morphine  Where is the problem?  Manufacturing and supply level.  Will this shortage affect you?  Only if you take any of the above pain medications.  How? You may be unable to fill your prescription.  Your pharmacist may offer a "partial fill" of your prescription. (Warning: Do not accept partial fills.) Prescriptions partially filled cannot be transferred to another pharmacy. Read our Medication Rules and Regulation. Depending on how much medicine you are dependent on, you may experience withdrawals when unable to get the medication.  Recommendations: Consider ending your dependence on opioid pain medications. Ask your pain specialist to assist you with the process. Consider switching to a medication currently not in shortage, such as Buprenorphine. Talk to your pain specialist about this option. Consider decreasing your pain medication requirements by managing tolerance thru "Drug Holidays". This may help minimize withdrawals, should you run out of medicine. Control your pain thru   the use of non-pharmacological interventional therapies.   Your prescriber: Prescribers cannot be blamed for shortages. Medication manufacturing and supply issues cannot be fixed by the prescriber.   NOTE: The prescriber is not responsible for supplying the medication, or solving supply issues. Work with your pharmacist to solve it. The patient is responsible for  the decision to take or continue taking the medication and for identifying and securing a legal supply source. By law, supplying the medication is the job and responsibility of the pharmacy. The prescriber is responsible for the evaluation, monitoring, and prescribing of these medications.   Prescribers will NOT: Re-issue prescriptions that have been partially filled. Re-issue prescriptions already sent to a pharmacy.  Re-send prescriptions to a different pharmacy because yours did not have your medication. Ask pharmacist to order more medicine or transfer the prescription to another pharmacy. (Read below.)  New 2023 regulation: "October 07, 2021 Revised Regulation Allows DEA-Registered Pharmacies to Transfer Electronic Prescriptions at a Patient's Request DEA Headquarters Division - Public Information Office Patients now have the ability to request their electronic prescription be transferred to another pharmacy without having to go back to their practitioner to initiate the request. This revised regulation went into effect on Monday, October 03, 2021.     At a patient's request, a DEA-registered retail pharmacy can now transfer an electronic prescription for a controlled substance (schedules II-V) to another DEA-registered retail pharmacy. Prior to this change, patients would have to go through their practitioner to cancel their prescription and have it re-issued to a different pharmacy. The process was taxing and time consuming for both patients and practitioners.    The Drug Enforcement Administration (DEA) published its intent to revise the process for transferring electronic prescriptions on December 26, 2019.  The final rule was published in the federal register on September 01, 2021 and went into effect 30 days later.  Under the final rule, a prescription can only be transferred once between pharmacies, and only if allowed under existing state or other applicable law. The prescription must  remain in its electronic form; may not be altered in any way; and the transfer must be communicated directly between two licensed pharmacists. It's important to note, any authorized refills transfer with the original prescription, which means the entire prescription will be filled at the same pharmacy".  Reference: https://www.dea.gov/stories/2023/2023-10/2021-09-01/revised-regulation-allows-dea-registered-pharmacies-transfer (DEA website announcement)  https://www.govinfo.gov/content/pkg/FR-2021-09-01/pdf/2023-15847.pdf (Federal Register  Department of Justice)   Federal Register / Vol. 88, No. 143 / Thursday, September 01, 2021 / Rules and Regulations DEPARTMENT OF JUSTICE  Drug Enforcement Administration  21 CFR Part 1306  [Docket No. [id number]]  RIN [id number]-AB64 Transfer of Electronic Prescriptions for Schedules II-V Controlled Substances Between Pharmacies for Initial Filling  ____________________________________________________________________________________________     ____________________________________________________________________________________________  Transfer of Pain Medication between Pharmacies  Re: 2023 DEA Clarification on existing regulation  Published on DEA Website: October 07, 2021  Title: Revised Regulation Allows DEA-Registered Pharmacies to Transfer Electronic Prescriptions at a Patient's Request DEA Headquarters Division - Public Information Office  "Patients now have the ability to request their electronic prescription be transferred to another pharmacy without having to go back to their practitioner to initiate the request. This revised regulation went into effect on Monday, October 03, 2021.     At a patient's request, a DEA-registered retail pharmacy can now transfer an electronic prescription for a controlled substance (schedules II-V) to another DEA-registered retail pharmacy. Prior to this change, patients would have to go through their practitioner to  cancel their prescription   and have it re-issued to a different pharmacy. The process was taxing and time consuming for both patients and practitioners.    The Drug Enforcement Administration (DEA) published its intent to revise the process for transferring electronic prescriptions on December 26, 2019.  The final rule was published in the federal register on September 01, 2021 and went into effect 30 days later.  Under the final rule, a prescription can only be transferred once between pharmacies, and only if allowed under existing state or other applicable law. The prescription must remain in its electronic form; may not be altered in any way; and the transfer must be communicated directly between two licensed pharmacists. It's important to note, any authorized refills transfer with the original prescription, which means the entire prescription will be filled at the same pharmacy."    REFERENCES: 1. DEA website announcement https://www.dea.gov/stories/2023/2023-10/2021-09-01/revised-regulation-allows-dea-registered-pharmacies-transfer  2. Department of Justice website  https://www.govinfo.gov/content/pkg/FR-2021-09-01/pdf/2023-15847.pdf  3. DEPARTMENT OF JUSTICE Drug Enforcement Administration 21 CFR Part 1306 [Docket No. [id number]] RIN [id number] "Transfer of Electronic Prescriptions for Schedules II-V Controlled Substances Between Pharmacies for Initial Filling"  ____________________________________________________________________________________________     _______________________________________________________________________  Medication Rules  Purpose: To inform patients, and their family members, of our medication rules and regulations.  Applies to: All patients receiving prescriptions from our practice (written or electronic).  Pharmacy of record: This is the pharmacy where your electronic prescriptions will be sent. Make sure we have the correct one.  Electronic prescriptions: In  compliance with the [place] Strengthen Opioid Misuse Prevention (STOP) Act of 2017 (Session Law 2017-74/H243), effective February 06, 2018, all controlled substances must be electronically prescribed. Written prescriptions, faxing, or calling prescriptions to a pharmacy will no longer be done.  Prescription refills: These will be provided only during in-person appointments. No medications will be renewed without a "face-to-face" evaluation with your provider. Applies to all prescriptions.  NOTE: The following applies primarily to controlled substances (Opioid* Pain Medications).   Type of encounter (visit): For patients receiving controlled substances, face-to-face visits are required. (Not an option and not up to the patient.)  Patient's responsibilities: Pain Pills: Bring all pain pills to every appointment (except for procedure appointments). Pill Bottles: Bring pills in original pharmacy bottle. Bring bottle, even if empty. Always bring the bottle of the most recent fill.  Medication refills: You are responsible for knowing and keeping track of what medications you are taking and when is it that you will need a refill. The day before your appointment: write a list of all prescriptions that need to be refilled. The day of the appointment: give the list to the admitting nurse. Prescriptions will be written only during appointments. No prescriptions will be written on procedure days. If you forget a medication: it will not be "Called in", "Faxed", or "electronically sent". You will need to get another appointment to get these prescribed. No early refills. Do not call asking to have your prescription filled early. Partial  or short prescriptions: Occasionally your pharmacy may not have enough pills to fill your prescription.  NEVER ACCEPT a partial fill or a prescription that is short of the total amount of pills that you were prescribed.  With controlled substances the law allows 72 hours for  the pharmacy to complete the prescription.  If the prescription is not completed within 72 hours, the pharmacist will require a new prescription to be written. This means that you will be short on your medicine and we WILL NOT send another prescription to complete your original   prescription.  Instead, request the pharmacy to send a carrier to a nearby branch to get enough medication to provide you with your full prescription. Prescription Accuracy: You are responsible for carefully inspecting your prescriptions before leaving our office. Have the discharge nurse carefully go over each prescription with you, before taking them home. Make sure that your name is accurately spelled, that your address is correct. Check the name and dose of your medication to make sure it is accurate. Check the number of pills, and the written instructions to make sure they are clear and accurate. Make sure that you are given enough medication to last until your next medication refill appointment. Taking Medication: Take medication as prescribed. When it comes to controlled substances, taking less pills or less frequently than prescribed is permitted and encouraged. Never take more pills than instructed. Never take the medication more frequently than prescribed.  Inform other Doctors: Always inform, all of your healthcare providers, of all the medications you take. Pain Medication from other Providers: You are not allowed to accept any additional pain medication from any other Doctor or Healthcare provider. There are two exceptions to this rule. (see below) In the event that you require additional pain medication, you are responsible for notifying us, as stated below. Cough Medicine: Often these contain an opioid, such as codeine or hydrocodone. Never accept or take cough medicine containing these opioids if you are already taking an opioid* medication. The combination may cause respiratory failure and death. Medication Agreement:  You are responsible for carefully reading and following our Medication Agreement. This must be signed before receiving any prescriptions from our practice. Safely store a copy of your signed Agreement. Violations to the Agreement will result in no further prescriptions. (Additional copies of our Medication Agreement are available upon request.) Laws, Rules, & Regulations: All patients are expected to follow all Federal and State Laws, Statutes, Rules, & Regulations. Ignorance of the Laws does not constitute a valid excuse.  Illegal drugs and Controlled Substances: The use of illegal substances (including, but not limited to marijuana and its derivatives) and/or the illegal use of any controlled substances is strictly prohibited. Violation of this rule may result in the immediate and permanent discontinuation of any and all prescriptions being written by our practice. The use of any illegal substances is prohibited. Adopted CDC guidelines & recommendations: Target dosing levels will be at or below 60 MME/day. Use of benzodiazepines** is not recommended.  Exceptions: There are only two exceptions to the rule of not receiving pain medications from other Healthcare Providers. Exception #1 (Emergencies): In the event of an emergency (i.e.: accident requiring emergency care), you are allowed to receive additional pain medication. However, you are responsible for: As soon as you are able, call our office [phone], at any time of the day or night, and leave a message stating your name, the date and nature of the emergency, and the name and dose of the medication prescribed. In the event that your call is answered by a member of our staff, make sure to document and save the date, time, and the name of the person that took your information.  Exception #2 (Planned Surgery): In the event that you are scheduled by another doctor or dentist to have any type of surgery or procedure, you are allowed (for a period no  longer than 30 days), to receive additional pain medication, for the acute post-op pain. However, in this case, you are responsible for picking up a copy of   our "Post-op Pain Management for Surgeons" handout, and giving it to your surgeon or dentist. This document is available at our office, and does not require an appointment to obtain it. Simply go to our office during business hours (Monday-Thursday from 8:00 AM to 4:00 PM) (Friday 8:00 AM to 12:00 Noon) or if you have a scheduled appointment with us, prior to your surgery, and ask for it by name. In addition, you are responsible for: calling our office [phone], at any time of the day or night, and leaving a message stating your name, name of your surgeon, type of surgery, and date of procedure or surgery. Failure to comply with your responsibilities may result in termination of therapy involving the controlled substances. Medication Agreement Violation. Following the above rules, including your responsibilities will help you in avoiding a Medication Agreement Violation ("Breaking your Pain Medication Contract").  Consequences:  Not following the above rules may result in permanent discontinuation of medication prescription therapy.  *Opioid medications include: morphine, codeine, oxycodone, oxymorphone, hydrocodone, hydromorphone, meperidine, tramadol, tapentadol, buprenorphine, fentanyl, methadone. **Benzodiazepine medications include: diazepam (Valium), alprazolam (Xanax), clonazepam (Klonopine), lorazepam (Ativan), clorazepate (Tranxene), chlordiazepoxide (Librium), estazolam (Prosom), oxazepam (Serax), temazepam (Restoril), triazolam (Halcion) (Last updated: 11/29/2021) ______________________________________________________________________    ______________________________________________________________________  Medication Recommendations and Reminders  Applies to: All patients receiving prescriptions (written and/or  electronic).  Medication Rules & Regulations: You are responsible for reading, knowing, and following our "Medication Rules" document. These exist for your safety and that of others. They are not flexible and neither are we. Dismissing or ignoring them is an act of "non-compliance" that may result in complete and irreversible termination of such medication therapy. For safety reasons, "non-compliance" will not be tolerated. As with the [place] fundamental legal principle of "ignorance of the law is no defense", we will accept no excuses for not having read and knowing the content of documents provided to you by our practice.  Pharmacy of record:  Definition: This is the pharmacy where your electronic prescriptions will be sent.  We do not endorse any particular pharmacy. It is up to you and your insurance to decide what pharmacy to use.  We do not restrict you in your choice of pharmacy. However, once we write for your prescriptions, we will NOT be re-sending more prescriptions to fix restricted supply problems created by your pharmacy, or your insurance.  The pharmacy listed in the electronic medical record should be the one where you want electronic prescriptions to be sent. If you choose to change pharmacy, simply notify our nursing staff. Changes will be made only during your regular appointments and not over the phone.  Recommendations: Keep all of your pain medications in a safe place, under lock and key, even if you live alone. We will NOT replace lost, stolen, or damaged medication. We do not accept "Police Reports" as proof of medications having been stolen. After you fill your prescription, take 1 week's worth of pills and put them away in a safe place. You should keep a separate, properly labeled bottle for this purpose. The remainder should be kept in the original bottle. Use this as your primary supply, until it runs out. Once it's gone, then you know that you have 1 week's worth of medicine,  and it is time to come in for a prescription refill. If you do this correctly, it is unlikely that you will ever run out of medicine. To make sure that the above recommendation works, it is very important that you make   sure your medication refill appointments are scheduled at least 1 week before you run out of medicine. To do this in an effective manner, make sure that you do not leave the office without scheduling your next medication management appointment. Always ask the nursing staff to show you in your prescription , when your medication will be running out. Then arrange for the receptionist to get you a return appointment, at least 7 days before you run out of medicine. Do not wait until you have 1 or 2 pills left, to come in. This is very poor planning and does not take into consideration that we may need to cancel appointments due to bad weather, sickness, or emergencies affecting our staff. DO NOT ACCEPT A "Partial Fill": If for any reason your pharmacy does not have enough pills/tablets to completely fill or refill your prescription, do not allow for a "partial fill". The law allows the pharmacy to complete that prescription within 72 hours, without requiring a new prescription. If they do not fill the rest of your prescription within those 72 hours, you will need a separate prescription to fill the remaining amount, which we will NOT provide. If the reason for the partial fill is your insurance, you will need to talk to the pharmacist about payment alternatives for the remaining tablets, but again, DO NOT ACCEPT A PARTIAL FILL, unless you can trust your pharmacist to obtain the remainder of the pills within 72 hours.  Prescription refills and/or changes in medication(s):  Prescription refills, and/or changes in dose or medication, will be conducted only during scheduled medication management appointments. (Applies to both, written and electronic prescriptions.) No refills on procedure days. No  medication will be changed or started on procedure days. No changes, adjustments, and/or refills will be conducted on a procedure day. Doing so will interfere with the diagnostic portion of the procedure. No phone refills. No medications will be "called into the pharmacy". No Fax refills. No weekend refills. No Holliday refills. No after hours refills.  Remember:  Business hours are:  Monday to Thursday 8:00 AM to 4:00 PM Provider's Schedule: Cobe Viney, MD - Appointments are:  Medication management: Monday and Wednesday 8:00 AM to 4:00 PM Procedure day: Tuesday and Thursday 7:30 AM to 4:00 PM Bilal Lateef, MD - Appointments are:  Medication management: Tuesday and Thursday 8:00 AM to 4:00 PM Procedure day: Monday and Wednesday 7:30 AM to 4:00 PM (Last update: 11/29/2021) ______________________________________________________________________    ____________________________________________________________________________________________  Drug Holidays  What is a "Drug Holiday"? Drug Holiday: is the name given to the process of slowly tapering down and temporarily stopping the pain medication for the purpose of decreasing or eliminating tolerance to the drug.  Benefits Improved effectiveness Decreased required effective dose Improved pain control End dependence on high dose therapy Decrease cost of therapy Uncovering "opioid-induced hyperalgesia". (OIH)  What is "opioid hyperalgesia"? It is a paradoxical increase in pain caused by exposure to opioids. Stopping the opioid pain medication, contrary to the expected, it actually decreases or completely eliminates the pain. Ref.: "A comprehensive review of opioid-induced hyperalgesia". Marion Lee, et.al. Pain Physician. 2011 Mar-Apr;14(2):145-61.  What is tolerance? Tolerance: the progressive loss of effectiveness of a pain medicine due to repetitive use. A common problem of opioid pain medications.  How long should a "Drug  Holiday" last? Effectiveness depends on the patient staying off all opioid pain medicines for a minimum of 14 consecutive days. (2 weeks)  How about just taking less of the medicine? Does not   work. Will not accomplish goal of eliminating the excess receptors.  How about switching to a different pain medicine? (AKA. "Opioid rotation") Does not work. Creates the illusion of effectiveness by taking advantage of inaccurate equivalent dose calculations between different opioids. -This "technique" was promoted by studies funded by pharmaceutical companies, such as [employer], creators of "OxyContin".  Can I stop the medicine "cold turkey"? We do not recommend it. You should always coordinate with your prescribing physician to make the transition as smoothly as possible. Avoid stopping the medicine abruptly without consulting. We recommend a "slow taper".  What is a slow taper? Taper: refers to the gradual decrease in dose.   How do I stop/taper the dose? Slowly. Decrease the daily amount of pills that you take by one (1) pill every seven (7) days. This is called a "slow downward taper". Example: if you normally take four (4) pills per day, drop it to three (3) pills per day for seven (7) days, then to two (2) pills per day for seven (7) days, then to one (1) per day for seven (7) days, and then stop the medicine. The 14 day "Drug Holiday" starts on the first day without medicine.   Will I experience withdrawals? Unlikely with a slow taper.  What triggers withdrawals? Withdrawals are triggered by the sudden/abrupt stop of high dose opioids. Withdrawals can be avoided by slowly decreasing the dose over a prolonged period of time.  What are withdrawals? Symptoms associated with sudden/abrupt reduction/stopping of high-dose, long-term use of pain medication. Withdrawal are seldom seen on low dose therapy, or patients rarely taking opioid medication.  Early Withdrawal Symptoms may  include: Agitation Anxiety Muscle aches Increased tearing Insomnia Runny nose Sweating Yawning  Late symptoms may include: Abdominal cramping Diarrhea Dilated pupils Goose bumps Nausea Vomiting  When could I see withdrawals? Onset: 8-24 hours after last use for most opioids. 12-48 hours for long-acting opioids (i.e.: methadone)  How long could they last? Duration: 4-10 days for most opioids. 14-21 days for long-acting opioids (i.e.: methadone)  What will happen after I complete my "Drug Holiday"? The need and indications for the opioid analgesic will be reviewed before restarting the medication. Dose requirements will likely decrease and the dose will need to be adjusted accordingly.   (Last update: 04/26/2022) ____________________________________________________________________________________________    ____________________________________________________________________________________________  WARNING: CBD (cannabidiol) & Delta (Delta-8 tetrahydrocannabinol) products.   Applicable to:  All individuals currently taking or considering taking CBD (cannabidiol) and, more important, all patients taking opioid analgesic controlled substances (pain medication). (Example: oxycodone; oxymorphone; hydrocodone; hydromorphone; morphine; methadone; tramadol; tapentadol; fentanyl; buprenorphine; butorphanol; dextromethorphan; meperidine; codeine; etc.)  Introduction:  Recently there has been a drive towards the use of "natural" products for the treatment of different conditions, including pain anxiety and sleep disorders. Marijuana and hemp are two varieties of the cannabis genus plants. Marijuana and its derivatives are illegal, while hemp and its derivatives are not. Cannabidiol (CBD) and tetrahydrocannabinol (THC), are two natural compounds found in plants of the Cannabis genus. They can both be extracted from hemp or marijuana. Both compounds interact with your body's endocannabinoid  system in very different ways. CBD is associated with pain relief (analgesia) while THC is associated with the psychoactive effects ("the high") obtained from the use of marijuana products. There are two main types of THC: Delta-9, which comes from the marijuana plant and it is illegal, and Delta-8, which comes from the hemp plant, and it is legal. (Both, Delta-9-THC and Delta-8-THC are psychoactive and   give you "the high".)   Legality:  Marijuana and its derivatives: illegal Hemp and its derivatives: Legal (State dependent) UPDATE: (03/25/2021) The Drug Enforcement Agency (DEA) issued a letter stating that "delta" cannabinoids, including Delta-8-THCO and Delta-9-THCO, synthetically derived from hemp do not qualify as hemp and will be viewed as Schedule I drugs. (Schedule I drugs, substances, or chemicals are defined as drugs with no currently accepted medical use and a high potential for abuse. Some examples of Schedule I drugs are: heroin, lysergic acid diethylamide (LSD), marijuana (cannabis), 3,4-methylenedioxymethamphetamine (ecstasy), methaqualone, and peyote.) (https://www.dea.gov)  Legal status of CBD in [place]:  "Conditionally Legal"  Reference: "FDA Regulation of Cannabis and Cannabis-Derived Products, Including Cannabidiol (CBD)" - https://www.fda.gov/news-events/public-health-focus/fda-regulation-cannabis-and-cannabis-derived-products-including-cannabidiol-cbd  Warning:  CBD is not FDA approved and has not undergo the same manufacturing controls as prescription drugs.  This means that the purity and safety of available CBD may be questionable. Most of the time, despite manufacturer's claims, it is contaminated with THC (delta-9-tetrahydrocannabinol - the chemical in marijuana responsible for the "HIGH").  When this is the case, the THC contaminant will trigger a positive urine drug screen (UDS) test for Marijuana (carboxy-THC).   The FDA recently put out a warning about 5 things that everyone  should be aware of regarding Delta-8 THC: Delta-8 THC products have not been evaluated or approved by the FDA for safe use and may be marketed in ways that put the public health at risk. The FDA has received adverse event reports involving delta-8 THC-containing products. Delta-8 THC has psychoactive and intoxicating effects. Delta-8 THC manufacturing often involve use of potentially harmful chemicals to create the concentrations of delta-8 THC claimed in the marketplace. The final delta-8 THC product may have potentially harmful by-products (contaminants) due to the chemicals used in the process. Manufacturing of delta-8 THC products may occur in uncontrolled or unsanitary settings, which may lead to the presence of unsafe contaminants or other potentially harmful substances. Delta-8 THC products should be kept out of the reach of children and pets.  NOTE: Because a positive UDS for any illicit substance is a violation of our medication agreement, your opioid analgesics (pain medicine) may be permanently discontinued.  MORE ABOUT CBD  General Information: CBD was discovered in 1940 and it is a derivative of the cannabis sativa genus plants (Marijuana and Hemp). It is one of the 113 identified substances found in Marijuana. It accounts for up to 40% of the plant's extract. As of 2018, preliminary clinical studies on CBD included research for the treatment of anxiety, movement disorders, and pain. CBD is available and consumed in multiple forms, including inhalation of smoke or vapor, as an aerosol spray, and by mouth. It may be supplied as an oil containing CBD, capsules, dried cannabis, or as a liquid solution. CBD is thought not to be as psychoactive as THC (delta-9-tetrahydrocannabinol - the chemical in marijuana responsible for the "HIGH"). Studies suggest that CBD may interact with different biological target receptors in the body, including cannabinoid and other neurotransmitter receptors. As of  2018 the mechanism of action for its biological effects has not been determined.  Side-effects  Adverse reactions: Dry mouth, diarrhea, decreased appetite, fatigue, drowsiness, malaise, weakness, sleep disturbances, and others.  Drug interactions:  CBD may interact with medications such as blood-thinners. CBD causes drowsiness on its own and it will increase drowsiness caused by other medications, including antihistamines (such as Benadryl), benzodiazepines (Xanax, Ativan, Valium), antipsychotics, antidepressants, opioids, alcohol and supplements such as kava, melatonin and St. John's Wort.    Other drug interactions: Brivaracetam (Briviact); Caffeine; Carbamazepine (Tegretol); Citalopram (Celexa); Clobazam (Onfi); Eslicarbazepine (Aptiom); Everolimus (Zostress); Lithium; Methadone (Dolophine); Rufinamide (Banzel); Sedative medications (CNS depressants); Sirolimus (Rapamune); Stiripentol (Diacomit); Tacrolimus (Prograf); Tamoxifen ; Soltamox); Topiramate (Topamax); Valproate; Warfarin (Coumadin); Zonisamide. (Last update: 01/16/2022) ____________________________________________________________________________________________   ____________________________________________________________________________________________  Naloxone Nasal Spray  Why am I receiving this medication? [place] ACT requires that all patients taking high dose opioids or at risk of opioids respiratory depression, be prescribed an opioid reversal agent, such as Naloxone (AKA: Narcan).  What is this medication? NALOXONE (nal OX one) treats opioid overdose, which causes slow or shallow breathing, severe drowsiness, or trouble staying awake. Call emergency services after using this medication. You may need additional treatment. Naloxone works by reversing the effects of opioids. It belongs to a group of medications called opioid blockers.  COMMON BRAND NAME(S): Kloxxado, Narcan  What should I tell my care team before  I take this medication? They need to know if you have any of these conditions: Heart disease Substance use disorder An unusual or allergic reaction to naloxone, other medications, foods, dyes, or preservatives Pregnant or trying to get pregnant Breast-feeding  When to use this medication? This medication is to be used for the treatment of respiratory depression (less than 8 breaths per minute) secondary to opioid overdose.   How to use this medication? This medication is for use in the nose. Lay the person on their back. Support their neck with your hand and allow the head to tilt back before giving the medication. The nasal spray should be given into 1 nostril. After giving the medication, move the person onto their side. Do not remove or test the nasal spray until ready to use. Get emergency medical help right away after giving the first dose of this medication, even if the person wakes up. You should be familiar with how to recognize the signs and symptoms of a narcotic overdose. If more doses are needed, give the additional dose in the other nostril. Talk to your care team about the use of this medication in children. While this medication may be prescribed for children as young as newborns for selected conditions, precautions do apply.  Naloxone Overdosage: If you think you have taken too much of this medicine contact a poison control center or emergency room at once.  NOTE: This medicine is only for you. Do not share this medicine with others.  What if I miss a dose? This does not apply.  What may interact with this medication? This is only used during an emergency. No interactions are expected during emergency use. This list may not describe all possible interactions. Give your health care provider a list of all the medicines, herbs, non-prescription drugs, or dietary supplements you use. Also tell them if you smoke, drink alcohol, or use illegal drugs. Some items may interact with  your medicine.  What should I watch for while using this medication? Keep this medication ready for use in the case of an opioid overdose. Make sure that you have the phone number of your care team and local hospital ready. You may need to have additional doses of this medication. Each nasal spray contains a single dose. Some emergencies may require additional doses. After use, bring the treated person to the nearest hospital or call 911. Make sure the treating care team knows that the person has received a dose of this medication. You will receive additional instructions on what to do during and after use of this   medication before an emergency occurs.  What side effects may I notice from receiving this medication? Side effects that you should report to your care team as soon as possible: Allergic reactions--skin rash, itching, hives, swelling of the face, lips, tongue, or throat Side effects that usually do not require medical attention (report these to your care team if they continue or are bothersome): Constipation Dryness or irritation inside the nose Headache Increase in blood pressure Muscle spasms Stuffy nose Toothache This list may not describe all possible side effects. Call your doctor for medical advice about side effects. You may report side effects to FDA at 1-800-FDA-1088.  Where should I keep my medication? Because this is an emergency medication, you should keep it with you at all times.  Keep out of the reach of children and pets. Store between 20 and 25 degrees C (68 and 77 degrees F). Do not freeze. Throw away any unused medication after the expiration date. Keep in original box until ready to use.  NOTE: This sheet is a summary. It may not cover all possible information. If you have questions about this medicine, talk to your doctor, pharmacist, or health care provider.   2023 Elsevier/Gold Standard (2020-10-01  00:00:00)  ____________________________________________________________________________________________   

## 2022-05-31 ENCOUNTER — Encounter: Payer: Self-pay | Admitting: Pain Medicine

## 2022-05-31 ENCOUNTER — Other Ambulatory Visit: Payer: Medicare HMO

## 2022-05-31 ENCOUNTER — Other Ambulatory Visit: Payer: Self-pay | Admitting: Internal Medicine

## 2022-05-31 ENCOUNTER — Ambulatory Visit: Payer: Worker's Compensation | Attending: Pain Medicine | Admitting: Pain Medicine

## 2022-05-31 VITALS — BP 121/69 | HR 65 | Temp 97.7°F | Resp 16 | Ht 72.0 in | Wt 199.0 lb

## 2022-05-31 DIAGNOSIS — G8929 Other chronic pain: Secondary | ICD-10-CM | POA: Insufficient documentation

## 2022-05-31 DIAGNOSIS — Z79891 Long term (current) use of opiate analgesic: Secondary | ICD-10-CM | POA: Insufficient documentation

## 2022-05-31 DIAGNOSIS — M5442 Lumbago with sciatica, left side: Secondary | ICD-10-CM | POA: Diagnosis not present

## 2022-05-31 DIAGNOSIS — M5417 Radiculopathy, lumbosacral region: Secondary | ICD-10-CM | POA: Insufficient documentation

## 2022-05-31 DIAGNOSIS — M79605 Pain in left leg: Secondary | ICD-10-CM | POA: Insufficient documentation

## 2022-05-31 DIAGNOSIS — I2510 Atherosclerotic heart disease of native coronary artery without angina pectoris: Secondary | ICD-10-CM | POA: Diagnosis not present

## 2022-05-31 DIAGNOSIS — M961 Postlaminectomy syndrome, not elsewhere classified: Secondary | ICD-10-CM | POA: Diagnosis not present

## 2022-05-31 DIAGNOSIS — Z79899 Other long term (current) drug therapy: Secondary | ICD-10-CM | POA: Insufficient documentation

## 2022-05-31 DIAGNOSIS — G894 Chronic pain syndrome: Secondary | ICD-10-CM | POA: Insufficient documentation

## 2022-05-31 DIAGNOSIS — E039 Hypothyroidism, unspecified: Secondary | ICD-10-CM | POA: Diagnosis not present

## 2022-05-31 DIAGNOSIS — E785 Hyperlipidemia, unspecified: Secondary | ICD-10-CM | POA: Diagnosis not present

## 2022-05-31 DIAGNOSIS — M5136 Other intervertebral disc degeneration, lumbar region: Secondary | ICD-10-CM | POA: Diagnosis present

## 2022-05-31 MED ORDER — OXYCODONE HCL 10 MG PO TABS: 10.0000 mg | ORAL_TABLET | Freq: Four times a day (QID) | ORAL | 0 refills | Status: DC | PRN

## 2022-05-31 NOTE — Progress Notes (Signed)
Nursing Pain Medication Assessment:  Safety precautions to be maintained throughout the outpatient stay will include: orient to surroundings, keep bed in low position, maintain call bell within reach at all times, provide assistance with transfer out of bed and ambulation.  Medication Inspection Compliance: Pill count conducted under aseptic conditions, in front of the patient. Neither the pills nor the bottle was removed from the patient's sight at any time. Once count was completed pills were immediately returned to the patient in their original bottle.  Medication: Oxycodone IR Pill/Patch Count:  37 of 120 pills remain Pill/Patch Appearance: Markings consistent with prescribed medication Bottle Appearance: Standard pharmacy container. Clearly labeled. Filled Date: 04 / 03 / 2024 Last Medication intake:  Today

## 2022-06-01 LAB — LIPID PANEL W/O CHOL/HDL RATIO
Cholesterol, Total: 92 mg/dL — ABNORMAL LOW (ref 100–199)
HDL: 33 mg/dL — ABNORMAL LOW (ref >39)
LDL Chol Calc (NIH): 39 mg/dL (ref 0–99)
Triglycerides: 107 mg/dL (ref 0–149)
VLDL Cholesterol Cal: 20 mg/dL (ref 5–40)

## 2022-06-01 LAB — TSH: TSH: 1.89 u[IU]/mL (ref 0.450–4.500)

## 2022-06-05 ENCOUNTER — Encounter: Payer: Self-pay | Admitting: Internal Medicine

## 2022-06-05 ENCOUNTER — Ambulatory Visit (INDEPENDENT_AMBULATORY_CARE_PROVIDER_SITE_OTHER): Payer: Medicare HMO | Admitting: Internal Medicine

## 2022-06-05 VITALS — BP 108/58 | HR 59 | Ht 72.0 in | Wt 198.0 lb

## 2022-06-05 DIAGNOSIS — E782 Mixed hyperlipidemia: Secondary | ICD-10-CM

## 2022-06-05 DIAGNOSIS — E039 Hypothyroidism, unspecified: Secondary | ICD-10-CM | POA: Diagnosis not present

## 2022-06-05 DIAGNOSIS — I10 Essential (primary) hypertension: Secondary | ICD-10-CM | POA: Diagnosis not present

## 2022-06-05 NOTE — Progress Notes (Signed)
Established Patient Office Visit  Subjective:  Patient ID: Chase Mendez, male    DOB: Jun 22, 1934  Age: 88 y.o. MRN: [id number]  Chief Complaint  Patient presents with   Follow-up    3 month follow up, discuss lab results.    No new complaints, here for lab review and medication refills. No recent falls, only reports nausea for which his ppi was switched to famotidine. Labs reviewed and notable for well controlled lipids and normal TSH.    No other concerns at this time.   Past Medical History:  Diagnosis Date   Acute encephalopathy 12/08/2014   Anxiety    ARF (acute renal failure) (HCC) 12/08/2014   Back pain    Benign neoplasm of large bowel    Capsulitis    fractured displaced metatarsal with capsulitis   Chronic back pain    Coronavirus infection    Depression    Diabetes mellitus without complication (HCC)    no medications currently   Dysphagia    Exostosis    painful, right hallux   Foot drop, left    wears a brace   Frequent falls 02/2018   poor balance, foot drop   GERD (gastroesophageal reflux disease)    Gout    Hypertension    Hypothyroidism    Insomnia    Low testosterone    Microscopic hematuria 2016   Myocardial infarction [facility])    patient unaware when it happened years ago.     Pneumonia 12/07/2014   Pressure ulcer 12/09/2014   Sepsis (HCC) 12/08/2014   Thyroid disease     Past Surgical History:  Procedure Laterality Date   APPENDECTOMY     BACK SURGERY  1995, 1996   x 2   CHOLECYSTECTOMY     ESOPHAGOGASTRODUODENOSCOPY (EGD) WITH PROPOFOL N/A 04/24/2017   Procedure: ESOPHAGOGASTRODUODENOSCOPY (EGD) WITH PROPOFOL;  Surgeon: Scot Jun, MD;  Location: [facility] ENDOSCOPY;  Service: Endoscopy;  Laterality: N/A;   EYE SURGERY Bilateral 1983, 1985   cataract extractions   SPINAL CORD STIMULATOR INSERTION N/A 03/13/2018   Procedure: SPINAL CORD STIMULATOR INSERTION;  Surgeon: Venetia Night, MD;  Location: [facility] ORS;  Service: Neurosurgery;   Laterality: N/A;    Social History   Socioeconomic History   Marital status: Married    Spouse name: dorothy   Number of children: 2   Years of education: Not on file   Highest education level: Some college, no degree  Occupational History   Occupation: maintenance / repair    Comment: disabled  Tobacco Use   Smoking status: Every Day    Packs/day: 0.50    Years: 70.00    Additional pack years: 0.00    Total pack years: 35.00    Types: Cigars, Cigarettes   Smokeless tobacco: Never   Tobacco comments:    unable to give cessation materials due to webex visit   Vaping Use   Vaping Use: Never used  Substance and Sexual Activity   Alcohol use: No   Drug use: Yes    Types: Oxycodone   Sexual activity: Not Currently  Other Topics Concern   Not on file  Social History Narrative   Not on file   Social Determinants of Health   Financial Resource Strain: Not on file  Food Insecurity: Not on file  Transportation Needs: Not on file  Physical Activity: Not on file  Stress: Not on file  Social Connections: Not on file  Intimate Partner Violence: Not on file  Family History  Problem Relation Age of Onset   Heart disease Mother    Diabetes Father    Kidney cancer Brother     Allergies  Allergen Reactions   Doxycycline    Ibuprofen Nausea Only   Sulfa Antibiotics Nausea Only    Review of Systems  Constitutional: Negative.   HENT: Negative.    Eyes: Negative.   Respiratory: Negative.    Cardiovascular: Negative.   Gastrointestinal:  Positive for nausea.  Genitourinary: Negative.   Skin: Negative.   Neurological: Negative.   Endo/Heme/Allergies: Negative.   Psychiatric/Behavioral:  Positive for depression. The patient is nervous/anxious.        Objective:   BP (!) 108/58   Pulse (!) 59   Ht 6' (1.829 m)   Wt 198 lb (89.8 kg)   SpO2 93%   BMI 26.85 kg/m   Vitals:   06/05/22 1027  BP: (!) 108/58  Pulse: (!) 59  Height: 6' (1.829 m)  Weight: 198  lb (89.8 kg)  SpO2: 93%  BMI (Calculated): 26.85    Physical Exam Vitals reviewed.  Constitutional:      Appearance: Normal appearance.  HENT:     Head: Normocephalic.     Left Ear: There is no impacted cerumen.     Nose: Nose normal.     Mouth/Throat:     Mouth: Mucous membranes are moist.     Pharynx: No posterior oropharyngeal erythema.  Eyes:     Extraocular Movements: Extraocular movements intact.     Pupils: Pupils are equal, round, and reactive to light.  Cardiovascular:     Rate and Rhythm: Regular rhythm.     Chest Wall: PMI is not displaced.     Pulses: Normal pulses.     Heart sounds: Normal heart sounds. No murmur heard. Pulmonary:     Effort: Pulmonary effort is normal.     Breath sounds: Normal air entry. No rhonchi or rales.  Abdominal:     General: Abdomen is flat. Bowel sounds are normal. There is no distension.     Palpations: Abdomen is soft. There is no hepatomegaly, splenomegaly or mass.     Tenderness: There is no abdominal tenderness.  Musculoskeletal:        General: Normal range of motion.     Cervical back: Normal range of motion and neck supple.     Right lower leg: No edema.     Left lower leg: No edema.  Skin:    General: Skin is warm and dry.  Neurological:     General: No focal deficit present.     Mental Status: He is alert and oriented to person, place, and time.     Cranial Nerves: No cranial nerve deficit.     Motor: No weakness.  Psychiatric:        Mood and Affect: Mood normal.        Behavior: Behavior normal.      No results found for any visits on 06/05/22.  Recent Results (from the past 2160 hour(Kaiulani Sitton))  Lipid Panel w/o Chol/HDL Ratio     Status: Abnormal   Collection Time: 05/31/22 10:31 AM  Result Value Ref Range   Cholesterol, Total 92 (L) 100 - 199 mg/dL   Triglycerides 161 0 - 149 mg/dL   HDL 33 (L) >09 mg/dL   VLDL Cholesterol Cal 20 5 - 40 mg/dL   LDL Chol Calc (NIH) 39 0 - 99 mg/dL  TSH     Status: None  Collection Time: 05/31/22 10:31 AM  Result Value Ref Range   TSH 1.890 0.450 - 4.500 uIU/mL      Assessment & Plan:   Problem List Items Addressed This Visit   None   No follow-ups on file.   Total time spent: 30 minutes  Luna Fuse, MD  06/05/2022

## 2022-06-07 NOTE — Progress Notes (Signed)
THERAPIST PROGRESS NOTE  Session Time: 10:03AM-10:50AM  Participation Level: Active  Behavioral Response: Casual, Neat, and Well GroomedAlertDepressed  Type of Therapy: Individual Therapy  Treatment Goals addressed:  Reduce overall frequency, intensity and duration of anxiety so that daily functioning is not impaired per pt self report 3 out of 5 sessions.   Reduce overall frequency, intensity and duration of depression so that daily functioning is not impaired per pt self report 3 out of 5 sessions documented.   ProgressTowards Goals: Progressing  Interventions: CBT, DBT, Motivational Interviewing, Solution Focused, and Other: ACT  Summary: Chase Mendez is a 88 y.o. male who presents with continued sxs of MDD including but not limited to lack of interest, lack of motivation, depressed mood, negative self affect, and feelings of hopelessness. Pt oriented to person, place, and time. Pt denies SI/HI or A/V hallucinations. Pt was cooperative during visit and was engaged throughout the visit. Pt does not report any other concerns at the time of visit.  Pt reported increase in depressed mood due to being diagnosed legally blind. Pt identified next steps he plans to take with providers to assist in potential tx. Pt reported feeling hopeless. Provided pt processing space. Discussed concept of having a grief response to loss of vision.    Pt reported looking forward to daughter coming to visit today. Pt reported feeling down due to not seeing his son and grateful to continue to have his daughter present.   Pt reported improvements in practicing DBT distress tolerance skill of opposite action when facing debilitating sxs. Pt identified how they discerned need to utilize opposite action and explored ways they can and have practiced opposite action to assist in combating depressive sxs. Pt reported that he attended [employer] and one other meeting that he had not gone to in several months.  Pt identified feeling anxious beforehand. Provided psychoed re: anticipation and how depression and anxiety utilize anticipation to keep pt in their comfort zone. Invited pt to focus on feeling of reward that comes with completing task and educate depression that this will feel uncomfortable until it becomes routine again. Pt reported feeling good while in attendance of meetings.   Pt reported increased nausea. Encouraged pt to discuss physical concerns with psychiatrist and other med providers. Pt identified barrier to activity that nausea poses in his day to day life. Discussed balance of practicing opposite action while listening to body's needs.   Pt utilized therapeutic space to process "should" statements related to his past. Discussed "should" statements being a cognitive distortion inspired by anxious sxs. Explored idea that pt had more than one correct path to walk down in life. Provided pt space to follow thought and discuss pros and cons of different life paths. Pt engaged in identifying positives of his current life path while also grieving what could have been. Pt reported biggest pro of his life path being meeting his wife. Invited pt to practice matching criticism with gratitude. Taught pt good, bad, gratitude processing exercise to assist pt in viewing all sides of situation or circumstance being processed. Stressed importance of ending with gratitude as gratitude can serve as a form of closure during processing work.   Pt processed grief of his contemporaries and his hobbies that he can no longer engage in. Discussed importance of having a support system with a wide age range. Encouraged pt to continue to attend meetings and connect with contemporaries as no one else will understand life changes pt is processing like his  contemporaries. Discussed concept of community in grief.  LCSW provided mood monitoring and treatment progress review in the context of this episode of treatment. LCSW  reviewed the pt's mood status since last session.   Pt is continuing to apply interventions/techniques learned in session into daily life situations. Pt is currently on track to meet goals utilizing interventions that are discussed in session. Treatment to continue as indicated. Personal growth and progress toward goals noted above.  Continued Recommendations as followed: Self-care behaviors, positive social engagements, focusing on positive physical and emotional wellness, and focusing on life/work balance.   Suicidal/Homicidal: Nowithout intent/plan  Therapist Response:  Provided pt education re: acceptance. Discussed how to make acceptance accessible at all parts of pt's healing journey.   LCSW practiced active listening to validate pt participation, build rapport, and create safe space for pt to feel heard as they are disclosing their thoughts and feelings.   Approached pt with strengths based perspective to assist pt in exploring strengths in moments of feeling low.   LCSW utilized therapeutic conversation skills informed by CBT, DBT, and ACT to expose pt to multiple ways of thinking about healing and to provide pt to access to multiple interventions.  Taught pt good, bad, gratitude processing exercise to assist pt in viewing all sides of situation or circumstance being processed. Stressed importance of ending with gratitude as gratitude can serve as a form of closure during processing work.   Educated pt on DBT skill Radical Acceptance. Pt reported improvements in utilizing distress tolerance skill of radical acceptance. Pt identified ways they have exercised control and how they discerned radical acceptance to be a necessary skill for their situation. Pt shared barriers to radical acceptance and discussed ways they plan to overcome those barriers in order to best utilize radical acceptance.    Plan: Return again in 3 weeks.  Diagnosis: MDD (major depressive disorder), recurrent episode,  moderate (HCC)    05/29/2022    1:00 PM 05/09/2022    3:10 PM 05/08/2022   11:05 AM 04/11/2022   11:39 AM  GAD 7 : Generalized Anxiety Score  Nervous, Anxious, on Edge 3 2 3 2   Control/stop worrying 3 2 2 2   Worry too much - different things 3 3 2 2   Trouble relaxing 2 2 1 1   Restless 2 2 1 1   Easily annoyed or irritable 3 2 1 2   Afraid - awful might happen 2 0 2 2  Total GAD 7 Score 18 13 12 12   Anxiety Difficulty Extremely difficult Very difficult Very difficult Extremely difficult       05/31/2022   10:55 AM 05/29/2022   12:59 PM 05/09/2022    3:10 PM  Depression screen PHQ 2/9  Decreased Interest 0 3 3  Down, Depressed, Hopeless 0 3 3  PHQ - 2 Score 0 6 6  Altered sleeping  2 2  Tired, decreased energy  2 2  Change in appetite  2 3  Feeling bad or failure about yourself   2 2  Trouble concentrating  2 2  Moving slowly or fidgety/restless  3 3  Suicidal thoughts  0 0  PHQ-9 Score  19 20  Difficult doing work/chores  Extremely dIfficult Very difficult    Collaboration of Care: Psychiatrist AEB Dr. Vanetta Shawl  Patient/Guardian was advised Release of Information must be obtained prior to any record release in order to collaborate their care with an outside provider. Patient/Guardian was advised if they have not already done so to contact  the registration department to sign all necessary forms in order for [place] to release information regarding their care.   Consent: Patient/Guardian gives verbal consent for treatment and assignment of benefits for services provided during this visit. Patient/Guardian expressed understanding and agreed to proceed.   Geoffry Paradise, LCSW 06/07/2022

## 2022-06-16 ENCOUNTER — Other Ambulatory Visit: Payer: Self-pay | Admitting: Psychiatry

## 2022-06-19 ENCOUNTER — Ambulatory Visit: Payer: Medicare HMO | Admitting: Licensed Clinical Social Worker

## 2022-07-06 ENCOUNTER — Telehealth: Payer: Medicare HMO | Admitting: Psychiatry

## 2022-07-10 ENCOUNTER — Ambulatory Visit: Payer: Medicare HMO

## 2022-07-10 DIAGNOSIS — I10 Essential (primary) hypertension: Secondary | ICD-10-CM

## 2022-07-10 DIAGNOSIS — F331 Major depressive disorder, recurrent, moderate: Secondary | ICD-10-CM

## 2022-07-10 NOTE — Chronic Care Management (AMB) (Signed)
Follow Up Pharmacist Visit (CCM)  Chase Mendez,Chase Mendez  88 years, Male  DOB: 08/25/1934  M: [phone] . Clinical Summary Next Pharmacist Follow Up: FPO in August - AM prefered Next AWV: to be scheduled Next PCP Visit: 09/04/22 Summary for PCP:  - Patient has no concerns at this time. He is controlled on chronic conditions. - He is awaiting getting a PCP in the [place] and get his eyes tested. - He does report being depressed and is only taking sertraline 25mg  due to higher strength giving him hallucinations. . Patient's Chronic Conditions: Gastroesophageal Reflux Disease (GERD), Constipation, Diabetes (DM), Hypothyroidism, Chronic Pain, Gout, Tobacco Use Disorder, Anxiety, Other, Depression, Hypertension (HTN) List Other Conditions (separated by comma): Lumbosacral Radiculopathy, Hepatic Steatosis, Degenerative disc disease lumbar/cervical, Spondylosis, Neuropathy,  . Disease Assessments Visit Date Visit Completed on: 07/10/2022 Subjective Information Subjective: Patient is legally blind. His stepdaughter got out of hospital last week. Lives with wife and stepdaughter and [name] (black male cat) AM - watches TV for 2-3 hrs. Lies down till 3.30pm. Takes medicine at 10am and 4pm. They haven't been to church due to their stepdaughter's knee replacement. He is getting back into the [place] soon, he spent 19yrs in [employer] [place] [employer] and now is getting back to PCP in [place]. He is hoping to get glasses and be able to read small print. He has not been able to drive, and reports being depressed about it. He cannot seem to shake off the depression. [facility] He has a spinal cord implant in the back (keeps batteries charges on it), it helps pain in the lower back. Lifestyle habits: Diet: Wakes up and gets a cup of coffee. BF is 2 pieces of toast, bowl of cereal or grits. Church brings supper since last week of May 2024. Has supper at 5-6pm. 7.30-8pm is bedtime. Sleeps till 8am. Uses  bathroom couple of times a night. Since his wife fell and hurt her back, patient is helping her get in bed. She has a CPAP machine. Sunday they used to eat out.  Tobacco: smokes a cigar - 1/2 ppd. What is the patient's sleep pattern?: No sleep issues SDOH: Accountable Health Communities Health-Related Social Needs Screening Tool ([employer]) SDOH questions were documented and reviewed (EMR or [employer]) within the past 12 months or since hospitalization?: Yes . Medication Adherence Does [facility] have access to medication refill history?: No Is Patient using [facility] pharmacy?: No [facility] of [facility] Current Rx insurance plan: [employer] Are meds synced by current pharmacy?: No Are meds delivered by current pharmacy?: No - delivery not available Would patient benefit from direct intervention of clinical lead in dispensing process to optimize clinical outcomes?: No . Hypertension (HTN) Most Recent BP: 108/58 Most Recent HR: 59 taken on: 06/02/2022 Care Gap: Need BP documented or last BP 140/90 or higher: Addressed Assessed today?: Yes BP today is: Could not obtain at today's visit Goal: <130/80 mmHG Is Patient checking BP at home?: Yes Patient home BP readings are ranging: 138/82 Has patient experienced hypotension, dizziness, falls or bradycardia?: No Additional Info: Patient stays nauseated most time - eats saltine crackers We discussed: Contacting PCP office for signs and symptoms of high or low blood pressure (hypotension, dizziness, falls, headaches, edema) Assessment:: Controlled Drug: Losartan 100mg  daily Pharmacist Assessment: Appropriate, Effective, Safe, Accessible Drug: amlodipine 10mg  daily Pharmacist Assessment: Appropriate, Effective, Safe, Accessible Plan/Follow up: Counsel: Proper BP measurement . Diabetes (DM) Most recent A1C: 6.3 taken on:  03/22/2021 Most  Recent GFR: 48 taken on: 02/14/2022 Type: 2 Most recent microalbumin ratio: N/A Care Gap: Statin therapy needed: Addressed Care Gap: Need A1c documented or last A1c > 9 %: Addressed Care Gap: Need eye exam documented in EMR or by claim: Needs to be addressed Care Gap: Need eGFR and uACR for kidney health evaluation: Addressed Assessed today?: No Drug: Gabapentin 600mg - 1 tablet twice daily.  . Depression Most recent PHQ-9 Score: 20 taken on: 05/09/2022 Assessed today?: Yes Completing the PHQ-9 Questionnaire today?: No In your opinion, how do you feel your depression symptoms have been controlled over the past 3 months?: Worsened Drug: Zoloft 50mg - 1/2 tablet at bedtime. - due to hallucinations . Preventative Health Care Gap: Colorectal cancer screening: Patient excluded from population (Age > 2, hx of colorectal cancer or colectomy, hospice services) Care Gap: Breast cancer screening: Patient excluded from population (Age > 58, hx of bilateral mastectomy, frailty, hospice services) Care Gap: Annual Wellness Visit (AWV): Needs to be addressed Plan/Follow up: DOES NOT WANT ANY VACCINES Engagement Notes thompson, joni on 07/06/2022 02:51 PM HC NONBILLABLE TIME: 0:55:00 Pharmacist Interventions Intervention Details Pharmacist Interventions discussed: Yes Monitoring: Routine monitoring . Lynann Bologna, PharmD  Televisit  Documentation 

## 2022-07-12 ENCOUNTER — Telehealth: Payer: Self-pay

## 2022-07-12 ENCOUNTER — Ambulatory Visit (INDEPENDENT_AMBULATORY_CARE_PROVIDER_SITE_OTHER): Payer: Medicare HMO | Admitting: Urology

## 2022-07-12 ENCOUNTER — Other Ambulatory Visit: Payer: Self-pay | Admitting: Psychiatry

## 2022-07-12 ENCOUNTER — Encounter: Payer: Self-pay | Admitting: Urology

## 2022-07-12 VITALS — BP 90/53 | HR 63 | Ht 72.0 in | Wt 191.0 lb

## 2022-07-12 DIAGNOSIS — E291 Testicular hypofunction: Secondary | ICD-10-CM

## 2022-07-12 DIAGNOSIS — R32 Unspecified urinary incontinence: Secondary | ICD-10-CM

## 2022-07-12 DIAGNOSIS — R7989 Other specified abnormal findings of blood chemistry: Secondary | ICD-10-CM | POA: Diagnosis not present

## 2022-07-12 DIAGNOSIS — N401 Enlarged prostate with lower urinary tract symptoms: Secondary | ICD-10-CM | POA: Diagnosis not present

## 2022-07-12 DIAGNOSIS — N3941 Urge incontinence: Secondary | ICD-10-CM

## 2022-07-12 DIAGNOSIS — N3281 Overactive bladder: Secondary | ICD-10-CM

## 2022-07-12 LAB — BLADDER SCAN AMB NON-IMAGING: Scan Result: 19

## 2022-07-12 MED ORDER — MIRABEGRON ER 50 MG PO TB24: 50.0000 mg | ORAL_TABLET | Freq: Every day | ORAL | 11 refills | Status: DC

## 2022-07-12 MED ORDER — SERTRALINE HCL 50 MG PO TABS: 50.0000 mg | ORAL_TABLET | Freq: Every day | ORAL | 0 refills | Status: DC

## 2022-07-12 NOTE — Progress Notes (Signed)
I, Chase Mendez, acting as a Neurosurgeon for Chase Altes, MD., have documented all relevant documentation on the behalf of Chase Altes, MD, as directed by  Chase Altes, MD while in the presence of Chase Altes, MD.   07/12/2022 12:51 PM   Chase Mendez 07/27/1934 [id number]  Referring provider: Sherron Monday, MD [address] [place],  [place] [address]  Chief Complaint  Patient presents with   Benign Prostatic Hypertrophy    HPI: Chase Mendez is a 88 y.o. male presenting for transfer of urologic care.  Previously followed by Dr. Evelene Mendez for BPH, ED, and overactive bladder. Last saw Dr. Evelene Mendez 01/25/22 and was on Tamsulosin 0.4 mg daily and Gemtesa 75 mg daily for his LUTS. Previously on TRT for hypogonadism. At his last visit, he felt Mirabetric was more effective than Gemtesa and strted back on Myrbetriq 25 mg daily. Urinary symptoms include urgency with occasional episodes of urge incontinence and decreased force and caliber of his urinary stream. He is inquiring if there are any other medications which may improved his voiding symptoms. States he has no tried Myrbetriq 50 mg. Was placed on testosterone for significantly low T-levels for cardiac protection; using testosterone cream sporadically   PMH: Past Medical History:  Diagnosis Date   Acute encephalopathy 12/08/2014   Anxiety    ARF (acute renal failure) (HCC) 12/08/2014   Back pain    Benign neoplasm of large bowel    Capsulitis    fractured displaced metatarsal with capsulitis   Chronic back pain    Coronavirus infection    Depression    Diabetes mellitus without complication (HCC)    no medications currently   Dysphagia    Exostosis    painful, right hallux   Foot drop, left    wears a brace   Frequent falls 02/2018   poor balance, foot drop   GERD (gastroesophageal reflux disease)    Gout    Hypertension    Hypothyroidism    Insomnia    Low testosterone    Microscopic hematuria 2016    Myocardial infarction [facility])    patient unaware when it happened years ago.     Pneumonia 12/07/2014   Pressure ulcer 12/09/2014   Sepsis (HCC) 12/08/2014   Thyroid disease     Surgical History: Past Surgical History:  Procedure Laterality Date   APPENDECTOMY     BACK SURGERY  1995, 1996   x 2   CHOLECYSTECTOMY     ESOPHAGOGASTRODUODENOSCOPY (EGD) WITH PROPOFOL N/A 04/24/2017   Procedure: ESOPHAGOGASTRODUODENOSCOPY (EGD) WITH PROPOFOL;  Surgeon: Chase Jun, MD;  Location: [facility];  Service: Endoscopy;  Laterality: N/A;   EYE SURGERY Bilateral 1983, 1985   cataract extractions   SPINAL CORD STIMULATOR INSERTION N/A 03/13/2018   Procedure: SPINAL CORD STIMULATOR INSERTION;  Surgeon: Chase Night, MD;  Location: [facility] ORS;  Service: Neurosurgery;  Laterality: N/A;    Home Medications:  Allergies as of 07/12/2022       Reactions   Doxycycline    Ibuprofen Nausea Only   Sulfa Antibiotics Nausea Only        Medication List        Accurate as of July 12, 2022 12:51 PM. If you have any questions, ask your nurse or doctor.          STOP taking these medications    pantoprazole 40 MG tablet Commonly known as: PROTONIX Stopped by: Chase Altes, MD  TAKE these medications    acetaminophen 500 MG tablet Commonly known as: TYLENOL Take by mouth 2 (two) times daily as needed.   allopurinol 100 MG tablet Commonly known as: ZYLOPRIM TAKE 1 TABLET BY MOUTH EVERY OTHER DAY   amLODipine 10 MG tablet Commonly known as: NORVASC Take 10 mg by mouth daily.   aspirin 81 MG tablet Take 81 mg by mouth daily.   atorvastatin 10 MG tablet Commonly known as: LIPITOR TAKE 1 TABLET BY MOUTH EVERYDAY AT BEDTIME   cyanocobalamin 1000 MCG tablet Commonly known as: VITAMIN B12 Take 1,000 mcg by mouth daily.   famotidine 20 MG tablet Commonly known as: PEPCID Take 1 tablet by mouth 2 (two) times daily.   fluticasone 50 MCG/ACT nasal spray Commonly  known as: FLONASE Place into both nostrils.   gabapentin 600 MG tablet Commonly known as: NEURONTIN TAKE 1 TABLET BY MOUTH TWICE DAILY.   isosorbide mononitrate 30 MG 24 hr tablet Commonly known as: IMDUR Take 1 tablet (30 mg total) by mouth daily.   levothyroxine 125 MCG tablet Commonly known as: SYNTHROID Take 125 mcg by mouth daily before breakfast.   losartan 100 MG tablet Commonly known as: COZAAR TAKE 1 TABLET BY MOUTH EVERY DAY   mirabegron ER 50 MG Tb24 tablet Commonly known as: MYRBETRIQ Take 1 tablet (50 mg total) by mouth daily. Started by: Chase Altes, MD   naloxone 4 MG/0.1ML Liqd nasal spray kit Commonly known as: NARCAN Place 1 spray into the nose as needed for up to 365 doses (for opioid-induced respiratory depresssion). In case of emergency (overdose), spray once into each nostril. If no response within 3 minutes, repeat application and call 911.   ondansetron 4 MG disintegrating tablet Commonly known as: Zofran ODT Take 1 tablet (4 mg total) by mouth every 8 (eight) hours as needed for nausea or vomiting.   Oxycodone HCl 10 MG Tabs Take 1 tablet (10 mg total) by mouth every 6 (six) hours as needed. Must last 30 days. What changed: Another medication with the same name was removed. Continue taking this medication, and follow the directions you see here. Changed by: Chase Altes, MD   Oxycodone HCl 10 MG Tabs Take 1 tablet (10 mg total) by mouth every 6 (six) hours as needed. Must last 30 days. Start taking on: August 08, 2022 What changed: Another medication with the same name was removed. Continue taking this medication, and follow the directions you see here. Changed by: Chase Altes, MD   sertraline 50 MG tablet Commonly known as: Zoloft Take 1 tablet (50 mg total) by mouth at bedtime. What changed: how much to take   tamsulosin 0.4 MG Caps capsule Commonly known as: FLOMAX Take 0.4 mg by mouth daily.   Testosterone 40.5 MG/2.5GM (1.62%)  Gel Place 2 Pump onto the skin 3 (three) times a week.   Vitamin D (Ergocalciferol) 1.25 MG (50000 UNIT) Caps capsule Commonly known as: DRISDOL TAKE 1 CAPSULE BY MOUTH ONE TIME PER WEEK   vitamin E 180 MG (400 UNITS) capsule Take 400 Units by mouth daily.        Allergies:  Allergies  Allergen Reactions   Doxycycline    Ibuprofen Nausea Only   Sulfa Antibiotics Nausea Only    Family History: Family History  Problem Relation Age of Onset   Heart disease Mother    Diabetes Father    Kidney cancer Brother     Social History:  reports that he has  been smoking cigars and cigarettes. He has a 35.00 pack-year smoking history. He has never used smokeless tobacco. He reports current drug use. Drug: Oxycodone. He reports that he does not drink alcohol.   Physical Exam: BP (!) 90/53   Pulse 63   Ht 6' (1.829 m)   Wt 191 lb (86.6 kg)   BMI 25.90 kg/m   Constitutional:  Alert and oriented, No acute distress. HEENT: [place] AT Respiratory: Normal respiratory effort, no increased work of breathing Psychiatric: Normal mood and affect.  Laboratory Data:  Urinalysis Dipstick trace protein/microscopy negative.    Assessment & Plan:   1. BPH with LUTS Continued Tamsulosin. Titrate Myrbetriq to 50 mg. PVR today 19 mL.  2. Hypogonadism Using compounded testosterone cream sporadically. Testosterone level drawn today. PSA 01/2022 was 0.7. Follow up lab visit 6 months testosterone, PSA, hematocrit, and one year follow up visit.   I have reviewed the above documentation for accuracy and completeness, and I agree with the above.   Chase Altes, MD  [facility] [address], [address] [place], [place] [address] [phone]

## 2022-07-12 NOTE — Telephone Encounter (Signed)
pt wife called left a message that he needs a refill on the sertraline. pt was last see on 4-2 next appt 6-26      Disp Refills Start End   sertraline (ZOLOFT) 50 MG tablet 30 tablet 1 05/09/2022 07/08/2022   Sig - Route: Take 1 tablet (50 mg total) by mouth at bedtime. - Oral   Patient taking differently: Take 25 mg by mouth at bedtime.       Sent to pharmacy as: sertraline (ZOLOFT) 50 MG tablet   Notes to Pharmacy: Dose uptitrated   E-Prescribing Status: Receipt confirmed by pharmacy (05/09/2022  3:05 PM EDT)

## 2022-07-13 ENCOUNTER — Other Ambulatory Visit: Payer: Self-pay

## 2022-07-13 ENCOUNTER — Encounter: Payer: Self-pay | Admitting: Urology

## 2022-07-13 ENCOUNTER — Telehealth: Payer: Self-pay

## 2022-07-13 LAB — URINALYSIS, COMPLETE
Bilirubin, UA: NEGATIVE
Glucose, UA: NEGATIVE
Ketones, UA: NEGATIVE
Leukocytes,UA: NEGATIVE
Nitrite, UA: NEGATIVE
RBC, UA: NEGATIVE
Specific Gravity, UA: 1.02 (ref 1.005–1.030)
Urobilinogen, Ur: >8 mg/dL — ABNORMAL HIGH (ref 0.2–1.0)
pH, UA: 6 (ref 5.0–7.5)

## 2022-07-13 LAB — MICROSCOPIC EXAMINATION

## 2022-07-13 LAB — TESTOSTERONE: Testosterone: 513 ng/dL (ref 264–916)

## 2022-07-13 MED ORDER — MIRABEGRON ER 50 MG PO TB24: 50.0000 mg | ORAL_TABLET | Freq: Every day | ORAL | 11 refills | Status: DC

## 2022-07-13 NOTE — Telephone Encounter (Signed)
Return call to Clennie Sunder pt's spouse.  She stated [facility] does not have any Mybet[facility]q and that she would like medication sent to [facility] pharmacy which is where Dr. Artis Flock would send the prescription.  States it was much cheaper.  Informed pt's spouse that we are not licensed outside of the [place] and are unable to send RX there.  Informed spouse she can use [facility] at [facility] and generic Mybetriq is 70.00/month.  Per wife's request RX sent to [facility].

## 2022-07-13 NOTE — Telephone Encounter (Signed)
pt wife notified that rx was sent

## 2022-07-13 NOTE — Telephone Encounter (Signed)
Pts wife called regarding RX for Mybetriq that was sent to [facility] 07/12/22.  States [facility] does not have the medication and she would like it sent to a different pharmacy.

## 2022-07-14 ENCOUNTER — Ambulatory Visit (INDEPENDENT_AMBULATORY_CARE_PROVIDER_SITE_OTHER): Payer: Medicare HMO | Admitting: Family

## 2022-07-14 DIAGNOSIS — R6889 Other general symptoms and signs: Secondary | ICD-10-CM | POA: Diagnosis not present

## 2022-07-14 DIAGNOSIS — Z20822 Contact with and (suspected) exposure to covid-19: Secondary | ICD-10-CM | POA: Diagnosis not present

## 2022-07-14 LAB — POCT XPERT XPRESS SARS COVID-2/FLU/RSV
FLU A: NEGATIVE
FLU B: NEGATIVE
RSV RNA, PCR: NEGATIVE
SARS Coronavirus 2: NEGATIVE

## 2022-07-14 MED ORDER — PAXLOVID (150/100) 10 X 150 MG & 10 X 100MG PO TBPK
2.0000 | ORAL_TABLET | Freq: Two times a day (BID) | ORAL | 0 refills | Status: DC
Start: 2022-07-14 — End: 2022-07-19

## 2022-07-14 NOTE — Progress Notes (Signed)
   Subjective   CHIEF COMPLAINT  COVID Testing      REASON FOR VISIT  COVID Test Only       Objective   Results for orders placed or performed in visit on 07/14/22  POCT XPERT XPRESS SARS COVID-2/FLU/RSV  Result Value Ref Range   SARS Coronavirus 2 neg    FLU A neg    FLU B neg    RSV RNA, PCR neg     Assessment & Plan  1. Flu-like symptoms Paxlovid Renal Dosing sent.  - POCT XPERT XPRESS SARS COVID-2/FLU/RSV  2. Close exposure to COVID-19 virus Paxlovid Renal Dosing sent.   Total time spent: 10 minutes  Miki Kins, FNP 07/14/2022

## 2022-07-17 ENCOUNTER — Inpatient Hospital Stay
Admission: EM | Admit: 2022-07-17 | Discharge: 2022-07-19 | DRG: 193 | Disposition: A | Discharge status: Home Health | Payer: Medicare HMO | Attending: Internal Medicine | Admitting: Internal Medicine

## 2022-07-17 ENCOUNTER — Inpatient Hospital Stay: Payer: Medicare HMO

## 2022-07-17 ENCOUNTER — Emergency Department: Payer: Medicare HMO

## 2022-07-17 ENCOUNTER — Other Ambulatory Visit: Payer: Self-pay

## 2022-07-17 ENCOUNTER — Encounter: Payer: Self-pay | Admitting: Medical Oncology

## 2022-07-17 DIAGNOSIS — E039 Hypothyroidism, unspecified: Secondary | ICD-10-CM | POA: Diagnosis present

## 2022-07-17 DIAGNOSIS — Z8249 Family history of ischemic heart disease and other diseases of the circulatory system: Secondary | ICD-10-CM | POA: Diagnosis not present

## 2022-07-17 DIAGNOSIS — Z0389 Encounter for observation for other suspected diseases and conditions ruled out: Secondary | ICD-10-CM | POA: Diagnosis not present

## 2022-07-17 DIAGNOSIS — Z1152 Encounter for screening for COVID-19: Secondary | ICD-10-CM

## 2022-07-17 DIAGNOSIS — I2510 Atherosclerotic heart disease of native coronary artery without angina pectoris: Secondary | ICD-10-CM | POA: Diagnosis present

## 2022-07-17 DIAGNOSIS — J440 Chronic obstructive pulmonary disease with acute lower respiratory infection: Secondary | ICD-10-CM | POA: Diagnosis not present

## 2022-07-17 DIAGNOSIS — R509 Fever, unspecified: Secondary | ICD-10-CM

## 2022-07-17 DIAGNOSIS — Z7982 Long term (current) use of aspirin: Secondary | ICD-10-CM

## 2022-07-17 DIAGNOSIS — E119 Type 2 diabetes mellitus without complications: Secondary | ICD-10-CM | POA: Diagnosis present

## 2022-07-17 DIAGNOSIS — G8929 Other chronic pain: Secondary | ICD-10-CM | POA: Diagnosis present

## 2022-07-17 DIAGNOSIS — Z20822 Contact with and (suspected) exposure to covid-19: Secondary | ICD-10-CM | POA: Diagnosis present

## 2022-07-17 DIAGNOSIS — A419 Sepsis, unspecified organism: Principal | ICD-10-CM | POA: Diagnosis present

## 2022-07-17 DIAGNOSIS — K219 Gastro-esophageal reflux disease without esophagitis: Secondary | ICD-10-CM | POA: Diagnosis present

## 2022-07-17 DIAGNOSIS — J441 Chronic obstructive pulmonary disease with (acute) exacerbation: Secondary | ICD-10-CM | POA: Diagnosis not present

## 2022-07-17 DIAGNOSIS — G9341 Metabolic encephalopathy: Secondary | ICD-10-CM | POA: Diagnosis present

## 2022-07-17 DIAGNOSIS — F17290 Nicotine dependence, other tobacco product, uncomplicated: Secondary | ICD-10-CM | POA: Diagnosis present

## 2022-07-17 DIAGNOSIS — E785 Hyperlipidemia, unspecified: Secondary | ICD-10-CM | POA: Diagnosis present

## 2022-07-17 DIAGNOSIS — R6520 Severe sepsis without septic shock: Secondary | ICD-10-CM | POA: Diagnosis not present

## 2022-07-17 DIAGNOSIS — Z66 Do not resuscitate: Secondary | ICD-10-CM | POA: Diagnosis present

## 2022-07-17 DIAGNOSIS — Z79890 Hormone replacement therapy (postmenopausal): Secondary | ICD-10-CM | POA: Diagnosis not present

## 2022-07-17 DIAGNOSIS — J9601 Acute respiratory failure with hypoxia: Secondary | ICD-10-CM | POA: Diagnosis present

## 2022-07-17 DIAGNOSIS — J189 Pneumonia, unspecified organism: Principal | ICD-10-CM | POA: Diagnosis present

## 2022-07-17 DIAGNOSIS — M109 Gout, unspecified: Secondary | ICD-10-CM | POA: Diagnosis present

## 2022-07-17 DIAGNOSIS — R6889 Other general symptoms and signs: Secondary | ICD-10-CM | POA: Diagnosis not present

## 2022-07-17 DIAGNOSIS — Z882 Allergy status to sulfonamides status: Secondary | ICD-10-CM

## 2022-07-17 DIAGNOSIS — R0902 Hypoxemia: Principal | ICD-10-CM

## 2022-07-17 DIAGNOSIS — M7989 Other specified soft tissue disorders: Secondary | ICD-10-CM | POA: Diagnosis not present

## 2022-07-17 DIAGNOSIS — R0602 Shortness of breath: Secondary | ICD-10-CM | POA: Diagnosis not present

## 2022-07-17 DIAGNOSIS — I252 Old myocardial infarction: Secondary | ICD-10-CM

## 2022-07-17 DIAGNOSIS — I10 Essential (primary) hypertension: Secondary | ICD-10-CM | POA: Diagnosis present

## 2022-07-17 DIAGNOSIS — Z79899 Other long term (current) drug therapy: Secondary | ICD-10-CM

## 2022-07-17 DIAGNOSIS — F419 Anxiety disorder, unspecified: Secondary | ICD-10-CM | POA: Diagnosis present

## 2022-07-17 DIAGNOSIS — Z8051 Family history of malignant neoplasm of kidney: Secondary | ICD-10-CM

## 2022-07-17 DIAGNOSIS — M549 Dorsalgia, unspecified: Secondary | ICD-10-CM | POA: Diagnosis present

## 2022-07-17 DIAGNOSIS — N179 Acute kidney failure, unspecified: Secondary | ICD-10-CM | POA: Diagnosis not present

## 2022-07-17 DIAGNOSIS — F17210 Nicotine dependence, cigarettes, uncomplicated: Secondary | ICD-10-CM | POA: Diagnosis present

## 2022-07-17 DIAGNOSIS — Z886 Allergy status to analgesic agent status: Secondary | ICD-10-CM

## 2022-07-17 DIAGNOSIS — Z833 Family history of diabetes mellitus: Secondary | ICD-10-CM

## 2022-07-17 DIAGNOSIS — Z881 Allergy status to other antibiotic agents status: Secondary | ICD-10-CM

## 2022-07-17 DIAGNOSIS — R0689 Other abnormalities of breathing: Secondary | ICD-10-CM | POA: Diagnosis not present

## 2022-07-17 DIAGNOSIS — R296 Repeated falls: Secondary | ICD-10-CM | POA: Diagnosis present

## 2022-07-17 DIAGNOSIS — J432 Centrilobular emphysema: Secondary | ICD-10-CM | POA: Diagnosis present

## 2022-07-17 DIAGNOSIS — R531 Weakness: Secondary | ICD-10-CM | POA: Diagnosis not present

## 2022-07-17 DIAGNOSIS — W19XXXA Unspecified fall, initial encounter: Secondary | ICD-10-CM | POA: Diagnosis not present

## 2022-07-17 LAB — APTT: aPTT: 30 seconds (ref 24–36)

## 2022-07-17 LAB — RESP PANEL BY RT-PCR (RSV, FLU A&B, COVID)  RVPGX2
Influenza A by PCR: NEGATIVE
Influenza B by PCR: NEGATIVE
Resp Syncytial Virus by PCR: NEGATIVE
SARS Coronavirus 2 by RT PCR: NEGATIVE

## 2022-07-17 LAB — BLOOD GAS, VENOUS
Acid-Base Excess: 1.6 mmol/L (ref 0.0–2.0)
Bicarbonate: 29.5 mmol/L — ABNORMAL HIGH (ref 20.0–28.0)
O2 Saturation: 68.5 %
Patient temperature: 37
pCO2, Ven: 60 mmHg (ref 44–60)
pH, Ven: 7.3 (ref 7.25–7.43)
pO2, Ven: 38 mmHg (ref 32–45)

## 2022-07-17 LAB — COMPREHENSIVE METABOLIC PANEL
ALT: 16 U/L (ref 0–44)
AST: 23 U/L (ref 15–41)
Albumin: 3.9 g/dL (ref 3.5–5.0)
Alkaline Phosphatase: 108 U/L (ref 38–126)
Anion gap: 9 (ref 5–15)
BUN: 27 mg/dL — ABNORMAL HIGH (ref 8–23)
CO2: 27 mmol/L (ref 22–32)
Calcium: 8.7 mg/dL — ABNORMAL LOW (ref 8.9–10.3)
Chloride: 103 mmol/L (ref 98–111)
Creatinine, Ser: 1.82 mg/dL — ABNORMAL HIGH (ref 0.61–1.24)
GFR, Estimated: 36 mL/min — ABNORMAL LOW (ref >60)
Glucose, Bld: 133 mg/dL — ABNORMAL HIGH (ref 70–99)
Potassium: 4.2 mmol/L (ref 3.5–5.1)
Sodium: 139 mmol/L (ref 135–145)
Total Bilirubin: 0.8 mg/dL (ref 0.3–1.2)
Total Protein: 7.2 g/dL (ref 6.5–8.1)

## 2022-07-17 LAB — CBC WITH DIFFERENTIAL/PLATELET
Abs Immature Granulocytes: 0.06 10*3/uL (ref 0.00–0.07)
Basophils Absolute: 0.1 10*3/uL (ref 0.0–0.1)
Basophils Relative: 1 %
Eosinophils Absolute: 0.4 10*3/uL (ref 0.0–0.5)
Eosinophils Relative: 3 %
HCT: 44.4 % (ref 39.0–52.0)
Hemoglobin: 14 g/dL (ref 13.0–17.0)
Immature Granulocytes: 1 %
Lymphocytes Relative: 11 %
Lymphs Abs: 1.2 10*3/uL (ref 0.7–4.0)
MCH: 29.2 pg (ref 26.0–34.0)
MCHC: 31.5 g/dL (ref 30.0–36.0)
MCV: 92.5 fL (ref 80.0–100.0)
Monocytes Absolute: 0.9 10*3/uL (ref 0.1–1.0)
Monocytes Relative: 9 %
Neutro Abs: 8.3 10*3/uL — ABNORMAL HIGH (ref 1.7–7.7)
Neutrophils Relative %: 75 %
Platelets: 175 10*3/uL (ref 150–400)
RBC: 4.8 MIL/uL (ref 4.22–5.81)
RDW: 13.6 % (ref 11.5–15.5)
WBC: 10.9 10*3/uL — ABNORMAL HIGH (ref 4.0–10.5)
nRBC: 0 % (ref 0.0–0.2)

## 2022-07-17 LAB — PROCALCITONIN: Procalcitonin: <0.1 ng/mL

## 2022-07-17 LAB — URINALYSIS, W/ REFLEX TO CULTURE (INFECTION SUSPECTED)
Bilirubin Urine: NEGATIVE
Glucose, UA: NEGATIVE mg/dL
Hgb urine dipstick: NEGATIVE
Ketones, ur: NEGATIVE mg/dL
Leukocytes,Ua: NEGATIVE
Nitrite: NEGATIVE
Protein, ur: NEGATIVE mg/dL
Specific Gravity, Urine: 1.021 (ref 1.005–1.030)
pH: 5 (ref 5.0–8.0)

## 2022-07-17 LAB — TROPONIN I (HIGH SENSITIVITY)
Troponin I (High Sensitivity): 6 ng/L (ref <18)
Troponin I (High Sensitivity): 10 ng/L (ref <18)

## 2022-07-17 LAB — LACTIC ACID, PLASMA
Lactic Acid, Venous: 2.1 mmol/L (ref 0.5–1.9)
Lactic Acid, Venous: 1.7 mmol/L (ref 0.5–1.9)

## 2022-07-17 LAB — FIBRINOGEN: Fibrinogen: 392 mg/dL (ref 210–475)

## 2022-07-17 LAB — BRAIN NATRIURETIC PEPTIDE: B Natriuretic Peptide: 133.2 pg/mL — ABNORMAL HIGH (ref 0.0–100.0)

## 2022-07-17 LAB — CULTURE, BLOOD (ROUTINE X 2): Culture: NO GROWTH

## 2022-07-17 LAB — GLUCOSE, CAPILLARY
Glucose-Capillary: 177 mg/dL — ABNORMAL HIGH (ref 70–99)
Glucose-Capillary: 154 mg/dL — ABNORMAL HIGH (ref 70–99)

## 2022-07-17 LAB — MRSA NEXT GEN BY PCR, NASAL: MRSA by PCR Next Gen: NOT DETECTED

## 2022-07-17 LAB — PROTIME-INR
INR: 1 (ref 0.8–1.2)
Prothrombin Time: 13.5 seconds (ref 11.4–15.2)

## 2022-07-17 LAB — C-REACTIVE PROTEIN: CRP: 0.6 mg/dL (ref <1.0)

## 2022-07-17 LAB — STREP PNEUMONIAE URINARY ANTIGEN: Strep Pneumo Urinary Antigen: NEGATIVE

## 2022-07-17 LAB — D-DIMER, QUANTITATIVE: D-Dimer, Quant: 1 ug/mL-FEU — ABNORMAL HIGH (ref 0.00–0.50)

## 2022-07-17 MED ORDER — IOHEXOL 350 MG/ML SOLN
50.0000 mL | Freq: Once | INTRAVENOUS | Status: AC | PRN
  Administered 2022-07-17: 50 mL via INTRAVENOUS

## 2022-07-17 MED ORDER — VITAMIN C 500 MG PO TABS
500.0000 mg | ORAL_TABLET | Freq: Every day | ORAL | Status: DC
  Administered 2022-07-17 – 2022-07-19 (×3): 500 mg via ORAL
  Filled 2022-07-17 (×3): qty 1

## 2022-07-17 MED ORDER — OXYCODONE HCL 5 MG PO TABS
10.0000 mg | ORAL_TABLET | Freq: Four times a day (QID) | ORAL | Status: DC | PRN
  Administered 2022-07-17 – 2022-07-19 (×5): 10 mg via ORAL
  Filled 2022-07-17 (×5): qty 2

## 2022-07-17 MED ORDER — POLYETHYLENE GLYCOL 3350 17 G PO PACK: 17.0000 g | PACK | Freq: Every day | ORAL | Status: DC | PRN

## 2022-07-17 MED ORDER — SODIUM CHLORIDE 0.9 % IV SOLN
500.0000 mg | INTRAVENOUS | Status: DC
  Administered 2022-07-17 – 2022-07-19 (×3): 500 mg via INTRAVENOUS
  Filled 2022-07-17 (×3): qty 5

## 2022-07-17 MED ORDER — IPRATROPIUM-ALBUTEROL 0.5-2.5 (3) MG/3ML IN SOLN
3.0000 mL | Freq: Four times a day (QID) | RESPIRATORY_TRACT | Status: DC
  Administered 2022-07-17 (×2): 3 mL via RESPIRATORY_TRACT
  Filled 2022-07-17 (×2): qty 3

## 2022-07-17 MED ORDER — CHLORHEXIDINE GLUCONATE CLOTH 2 % EX PADS
6.0000 | MEDICATED_PAD | Freq: Every day | CUTANEOUS | Status: DC
  Administered 2022-07-17 – 2022-07-19 (×3): 6 via TOPICAL

## 2022-07-17 MED ORDER — FAMOTIDINE 20 MG PO TABS
20.0000 mg | ORAL_TABLET | Freq: Every day | ORAL | Status: DC
  Administered 2022-07-17 – 2022-07-19 (×3): 20 mg via ORAL
  Filled 2022-07-17 (×3): qty 1

## 2022-07-17 MED ORDER — ZINC SULFATE 220 (50 ZN) MG PO CAPS
220.0000 mg | ORAL_CAPSULE | Freq: Every day | ORAL | Status: DC
  Administered 2022-07-17 – 2022-07-19 (×3): 220 mg via ORAL
  Filled 2022-07-17 (×3): qty 1

## 2022-07-17 MED ORDER — ONDANSETRON HCL 4 MG/2ML IJ SOLN
4.0000 mg | Freq: Four times a day (QID) | INTRAMUSCULAR | Status: DC | PRN
  Administered 2022-07-17 – 2022-07-19 (×3): 4 mg via INTRAVENOUS
  Filled 2022-07-17 (×3): qty 2

## 2022-07-17 MED ORDER — IPRATROPIUM-ALBUTEROL 0.5-2.5 (3) MG/3ML IN SOLN: 3.0000 mL | RESPIRATORY_TRACT | Status: DC | PRN

## 2022-07-17 MED ORDER — SERTRALINE HCL 50 MG PO TABS
50.0000 mg | ORAL_TABLET | Freq: Every day | ORAL | Status: DC
  Administered 2022-07-17 – 2022-07-18 (×2): 50 mg via ORAL
  Filled 2022-07-17 (×2): qty 1

## 2022-07-17 MED ORDER — DOCUSATE SODIUM 100 MG PO CAPS: 100.0000 mg | ORAL_CAPSULE | Freq: Two times a day (BID) | ORAL | Status: DC | PRN

## 2022-07-17 MED ORDER — FLUTICASONE PROPIONATE 50 MCG/ACT NA SUSP: 1.0000 | Freq: Every day | NASAL | Status: DC | PRN

## 2022-07-17 MED ORDER — TAMSULOSIN HCL 0.4 MG PO CAPS
0.4000 mg | ORAL_CAPSULE | Freq: Every day | ORAL | Status: DC
  Administered 2022-07-17 – 2022-07-19 (×3): 0.4 mg via ORAL
  Filled 2022-07-17 (×3): qty 1

## 2022-07-17 MED ORDER — GABAPENTIN 300 MG PO CAPS
600.0000 mg | ORAL_CAPSULE | Freq: Two times a day (BID) | ORAL | Status: DC
  Administered 2022-07-17 – 2022-07-19 (×5): 600 mg via ORAL
  Filled 2022-07-17 (×5): qty 2

## 2022-07-17 MED ORDER — HEPARIN SODIUM (PORCINE) 5000 UNIT/ML IJ SOLN
5000.0000 [IU] | Freq: Three times a day (TID) | INTRAMUSCULAR | Status: DC
  Administered 2022-07-17 – 2022-07-19 (×7): 5000 [IU] via SUBCUTANEOUS
  Filled 2022-07-17 (×7): qty 1

## 2022-07-17 MED ORDER — SODIUM CHLORIDE 0.9 % IV SOLN
2.0000 g | INTRAVENOUS | Status: DC
  Administered 2022-07-17 – 2022-07-19 (×3): 2 g via INTRAVENOUS
  Filled 2022-07-17 (×3): qty 20

## 2022-07-17 MED ORDER — SODIUM CHLORIDE 0.9 % IV SOLN: INTRAVENOUS | Status: DC

## 2022-07-17 MED ORDER — LEVOTHYROXINE SODIUM 25 MCG PO TABS
125.0000 ug | ORAL_TABLET | Freq: Every day | ORAL | Status: DC
  Administered 2022-07-18 – 2022-07-19 (×2): 125 ug via ORAL
  Filled 2022-07-17 (×2): qty 1

## 2022-07-17 MED ORDER — NICOTINE 14 MG/24HR TD PT24
14.0000 mg | MEDICATED_PATCH | Freq: Every day | TRANSDERMAL | Status: DC
  Administered 2022-07-17 – 2022-07-19 (×3): 14 mg via TRANSDERMAL
  Filled 2022-07-17 (×3): qty 1

## 2022-07-17 MED ORDER — INSULIN ASPART 100 UNIT/ML IJ SOLN
0.0000 [IU] | Freq: Three times a day (TID) | INTRAMUSCULAR | Status: DC
  Administered 2022-07-18: 3 [IU] via SUBCUTANEOUS
  Administered 2022-07-18 (×2): 2 [IU] via SUBCUTANEOUS
  Administered 2022-07-19: 3 [IU] via SUBCUTANEOUS
  Administered 2022-07-19: 2 [IU] via SUBCUTANEOUS
  Filled 2022-07-17 (×5): qty 1

## 2022-07-17 MED ORDER — ASPIRIN 81 MG PO TBEC
81.0000 mg | DELAYED_RELEASE_TABLET | Freq: Every day | ORAL | Status: DC
  Administered 2022-07-17 – 2022-07-19 (×3): 81 mg via ORAL
  Filled 2022-07-17 (×3): qty 1

## 2022-07-17 MED ORDER — SODIUM CHLORIDE 0.9 % IV BOLUS
1000.0000 mL | Freq: Once | INTRAVENOUS | Status: AC
  Administered 2022-07-17: 1000 mL via INTRAVENOUS

## 2022-07-17 MED ORDER — DEXAMETHASONE SODIUM PHOSPHATE 10 MG/ML IJ SOLN
6.0000 mg | INTRAMUSCULAR | Status: DC
  Administered 2022-07-17 – 2022-07-18 (×2): 6 mg via INTRAVENOUS
  Filled 2022-07-17: qty 0.6
  Filled 2022-07-17: qty 1

## 2022-07-17 MED ORDER — IPRATROPIUM-ALBUTEROL 0.5-2.5 (3) MG/3ML IN SOLN
3.0000 mL | Freq: Two times a day (BID) | RESPIRATORY_TRACT | Status: DC
  Administered 2022-07-18 – 2022-07-19 (×3): 3 mL via RESPIRATORY_TRACT
  Filled 2022-07-17 (×3): qty 3

## 2022-07-17 MED ORDER — ACETAMINOPHEN 325 MG PO TABS
650.0000 mg | ORAL_TABLET | Freq: Once | ORAL | Status: AC
  Administered 2022-07-17: 650 mg via ORAL
  Filled 2022-07-17: qty 2

## 2022-07-17 MED ORDER — INSULIN ASPART 100 UNIT/ML IJ SOLN: 0.0000 [IU] | Freq: Every day | INTRAMUSCULAR | Status: DC

## 2022-07-17 NOTE — Final Progress Note (Signed)
Transfer accepted from ICU team.  TRH will assume attending role on 6/11 at 7 AM.

## 2022-07-17 NOTE — Sepsis Progress Note (Signed)
Following for sepsis monitoring ?

## 2022-07-17 NOTE — ED Triage Notes (Signed)
Pt from home via [facility], pt has been having generalized weakness since Friday. Pts family recently diagnosed with covid. Pt was 88 on RA when EMS arrived, placed on 4L [place]. Pt also has had 2 falls over last couple days, last one this am, pt slid down wall. Has skin tear to Rt arm.

## 2022-07-17 NOTE — Consult Note (Signed)
CODE SEPSIS - PHARMACY COMMUNICATION  **Broad Spectrum Antibiotics should be administered within 1 hour of Sepsis diagnosis**  Time Code Sepsis Called/Page Received: 1610  Antibiotics Ordered: ceftriaxone and azithromycin  Time of 1st antibiotic administration: 0618  Additional action taken by pharmacy: N/A  Barrie Folk ,PharmD Clinical Pharmacist  07/17/2022  7:31 AM

## 2022-07-17 NOTE — ED Provider Notes (Signed)
[facility] Provider Note    Event Date/Time   First MD Initiated Contact with Patient 07/17/22 0518     (approximate)   History   Weakness   HPI  Chase Mendez is a 88 y.o. male past medical history significant for CAD, hypertension, hyperlipidemia, chronic pain, who presents to the emergency department with shortness of breath.  Patient states that he has had progressively worsening weakness, shortness of breath and cough over the past couple of days.  Multiple family members recently diagnosed with COVID.  When EMS arrived patient's oxygen saturation was in the low 80s, placed on 4 L nasal cannula with minimal improvement to 82%.  Multiple falls over the past couple of days.  Denies any fall or pain following the fall.  Decreased p.o. intake.  Decreased urine output but denies IV dysuria.  Denied vomiting or diarrhea.  Denies blood in his stool.  Denies history of DVT or PE.     Physical Exam   Triage Vital Signs: ED Triage Vitals  Enc Vitals Group     BP 07/17/22 0520 126/77     Pulse Rate 07/17/22 0519 86     Resp 07/17/22 0519 (!) 22     Temp 07/17/22 0520 100.1 F (37.8 C)     Temp Source 07/17/22 0520 Oral     SpO2 07/17/22 0520 (!) 82 %     Weight --      Height --      Head Circumference --      Peak Flow --      Pain Score --      Pain Loc --      Pain Edu? --      Excl. in GC? --     Most recent vital signs: Vitals:   07/17/22 0546 07/17/22 0600  BP:  (!) 117/47  Pulse:  79  Resp:  17  Temp:    SpO2: 98% 99%    Physical Exam Constitutional:      General: He is in acute distress.     Appearance: He is well-developed. He is ill-appearing.  Eyes:     Conjunctiva/sclera: Conjunctivae normal.  Cardiovascular:     Rate and Rhythm: Regular rhythm.  Pulmonary:     Effort: Respiratory distress present.     Comments: Tachypneic, 82% on 4 L nasal cannula, placed on nonrebreather facemask and improved to 95%.  Coarse breath  sounds throughout all lung fields.  Rhonchi and rales. Abdominal:     Tenderness: There is no abdominal tenderness.  Musculoskeletal:        General: Normal range of motion.     Cervical back: Normal range of motion. No tenderness.     Right lower leg: No edema.     Left lower leg: No edema.     Comments: No midline thoracic or lumbar tenderness to palpation.  No tenderness to bilateral hips or lower extremities.  No tenderness to bilateral upper extremities.  Full range of motion.  Skin:    General: Skin is warm.     Capillary Refill: Capillary refill takes 2 to 3 seconds.  Neurological:     Mental Status: He is alert. Mental status is at baseline.     IMPRESSION / MDM / ASSESSMENT AND PLAN / ED COURSE  I reviewed the triage vital signs and the nursing notes.  Differential diagnosis including community-acquired pneumonia, heart failure, COVID, ARDS, pulmonary embolism  On arrival patient significantly hypoxic with an  O2 sat of 80% on 4 L nasal cannula.  Placed on nonrebreather and then high flow nasal cannula.  On chart review no history of heart failure or COPD  EKG  I, Corena Herter, the attending physician, personally viewed and interpreted this ECG.  Right bundle branch block.  QTc 453.  No significant change when compared to prior EKG.  ST depression to septal leads.    No tachycardic or bradycardic dysrhythmias while on cardiac telemetry.  RADIOLOGY I independently reviewed imaging, my interpretation of imaging: Chest x-ray without focal findings consistent with pneumonia.  Chest x-ray with low volume concern for early bronchopneumonia  LABS (all labs ordered are listed, but only abnormal results are displayed) Labs interpreted as -    Labs Reviewed  LACTIC ACID, PLASMA - Abnormal; Notable for the following components:      Result Value   Lactic Acid, Venous 2.1 (*)    All other components within normal limits  COMPREHENSIVE METABOLIC PANEL - Abnormal; Notable  for the following components:   Glucose, Bld 133 (*)    BUN 27 (*)    Creatinine, Ser 1.82 (*)    Calcium 8.7 (*)    GFR, Estimated 36 (*)    All other components within normal limits  CBC WITH DIFFERENTIAL/PLATELET - Abnormal; Notable for the following components:   WBC 10.9 (*)    Neutro Abs 8.3 (*)    All other components within normal limits  BRAIN NATRIURETIC PEPTIDE - Abnormal; Notable for the following components:   B Natriuretic Peptide 133.2 (*)    All other components within normal limits  BLOOD GAS, VENOUS - Abnormal; Notable for the following components:   Bicarbonate 29.5 (*)    All other components within normal limits  RESP PANEL BY RT-PCR (RSV, FLU A&B, COVID)  RVPGX2  CULTURE, BLOOD (ROUTINE X 2)  CULTURE, BLOOD (ROUTINE X 2)  PROTIME-INR  APTT  LACTIC ACID, PLASMA  URINALYSIS, W/ REFLEX TO CULTURE (INFECTION SUSPECTED)  TROPONIN I (HIGH SENSITIVITY)  TROPONIN I (HIGH SENSITIVITY)     MDM    Blood cultures obtained.  Leukocytosis.  Acute kidney injury with a creatinine elevated at 1.8 with a BUN elevated at 27.  No significant electrolyte abnormalities.  Initial lactic acid is 2.1.  Initial troponin is negative.  Consulted ICU for admission.  Patient currently full code.  Felt that 30 cc/kg of IV fluids may be detrimental to the patient given his hypoxia, given 1 L of IV fluids and will reevaluate.  Discussed with ICU team, Riki Rusk, will come and evaluate the patient in the emergency department.  Ordered CTA to further evaluate for pulmonary embolism.   PROCEDURES:  Critical Care performed: yes  .Critical Care  Performed by: Corena Herter, MD Authorized by: Corena Herter, MD   Critical care provider statement:    Critical care time (minutes):  30   Critical care time was exclusive of:  Separately billable procedures and treating other patients   Critical care was necessary to treat or prevent imminent or life-threatening deterioration of the following  conditions:  Respiratory failure   Critical care was time spent personally by me on the following activities:  Development of treatment plan with patient or surrogate, discussions with consultants, evaluation of patient's response to treatment, examination of patient, ordering and review of laboratory studies, ordering and review of radiographic studies, ordering and performing treatments and interventions, pulse oximetry, re-evaluation of patient's condition and review of old charts   Patient's presentation is  most consistent with acute presentation with potential threat to life or bodily function.   MEDICATIONS ORDERED IN ED: Medications  cefTRIAXone (ROCEPHIN) 2 g in sodium chloride 0.9 % 100 mL IVPB (2 g Intravenous New Bag/Given 07/17/22 0618)  azithromycin (ZITHROMAX) 500 mg in sodium chloride 0.9 % 250 mL IVPB (500 mg Intravenous New Bag/Given 07/17/22 0641)  acetaminophen (TYLENOL) tablet 650 mg (has no administration in time range)  sodium chloride 0.9 % bolus 1,000 mL (1,000 mLs Intravenous New Bag/Given 07/17/22 0642)    FINAL CLINICAL IMPRESSION(S) / ED DIAGNOSES   Final diagnoses:  Hypoxic  Fever, unspecified fever cause     Rx / DC Orders   ED Discharge Orders     None        Note:  This document was prepared using Dragon voice recognition software and may include unintentional dictation errors.   Corena Herter, MD 07/17/22 [phone]

## 2022-07-17 NOTE — Consult Note (Signed)
PHARMACY CONSULT NOTE  Pharmacy Consult for Electrolyte Monitoring and Replacement   Recent Labs: Potassium (mmol/L)  Date Value  07/17/2022 4.2   Calcium (mg/dL)  Date Value  19/14/7829 8.7 (L)   Albumin (g/dL)  Date Value  56/21/3086 3.9   Sodium (mmol/L)  Date Value  07/17/2022 139     Assessment: 88 yo male presented to the ED with chief complaint of shortness of breath.  PMH includes CAD, HTN, HLD, and chronic pain.  Patient currently requiring oxygen supplementation by  HFNC and being admitted for treatment of acute respiratory failure.  Goal of Therapy:  WNL  Plan:  No electrolyte replacement indicated at this time Follow-up electrolytes with AM labs  Barrie Folk ,PharmD Clinical Pharmacist 07/17/2022 8:13 AM

## 2022-07-17 NOTE — H&P (Signed)
NAME:  Chase Mendez, MRN:  [id number], DOB:  23-Jun-1934, LOS: 0 ADMISSION DATE:  07/17/2022, CONSULTATION DATE:  07/17/2022 REFERRING MD:  Dr. Arnoldo Morale, CHIEF COMPLAINT:  Shortness of breath, cough, weakness   Brief Pt Description / Synopsis:  88 y.o. male, DNR/DNI,  admitted with Acute Hypoxic Respiratory Failure due to multifocal pneumonia in setting of suspected viral pneumonia.  History of Present Illness:  Chase Mendez is a 88 year old male with a past medical history significant for CAD, MI, COPD, hypertension, hypothyroidism, GERD, anxiety, chronic back pain, and frequent falls who presents to [facility] ED on 07/17/2022 due to progressive shortness of breath, cough, weakness, poor p.o. intake, and status post fall yesterday prior to presentation.  Patient is a poor historian, therefore history is obtained from the patient's wife at bedside along with chart review.  Per his wife, over the past few days he has had decreased p.o. intake, weakness and fatigue, confusion, and status post a fall yesterday which he did not hit his head, just scraped his elbow.  She sent him in for evaluation as she was concerned he could possibly be having a stroke.  She reports that she and other family members recently tested positive for COVID last Friday 6/7, and that her husband was started empirically on Paxlovid outpatient.  Patient currently denies chest pain, palpitations, dizziness, shortness of breath, abdominal pain, nausea, vomiting, diarrhea, dysuria.  Upon examination in the ED no signs of trauma, and he exhibited no focal deficits   ED Course: Initial Vital Signs: Temperature 100.1 F, RR 22, Pulse 86, BP 126/77, SpO2 82% on room air Significant Labs: BNP 133, HS Troponin 6, WBC 10.9 with Neutrophilia, procalcitonin <0.10, lactic acid 2.1, Creatinine 1.82, D-dimer 1.0 UA negative for UTI COVID/FLU/RSV PCR negative Imaging Chest X-ray>>IMPRESSION: Low volume chest with atelectatic type density at the  bases. Early bronchopneumonia is not excluded. CT angio Chest>>IMPRESSION: 1. CT findings are most consistent with multi lobar pneumonia involving the right upper, right lower and, to a lesser extent, the left lower lobes. 2. Negative for acute pulmonary embolus. 3. Additionally, the visualized hepatic flexure and proximal transverse colon are abnormal with diffuse wall thickening and pericolonic inflammatory stranding. Findings suggest active infectious/inflammatory colitis. 4. Aortic and coronary artery atherosclerotic vascular calcifications. 5. Mild centrilobular pulmonary emphysema. 6. Small hiatal hernia. Medications Administered: 1L NS bolus, Azithromycin & Ceftriaxone  PCCM asked to admit for further workup and treatment.  Please see "Significant Hospital Events" section for full detailed hospital course.   Pertinent  Medical History   Past Medical History:  Diagnosis Date   Acute encephalopathy 12/08/2014   Anxiety    ARF (acute renal failure) (HCC) 12/08/2014   Back pain    Benign neoplasm of large bowel    Capsulitis    fractured displaced metatarsal with capsulitis   Chronic back pain    Coronavirus infection    Depression    Diabetes mellitus without complication (HCC)    no medications currently   Dysphagia    Exostosis    painful, right hallux   Foot drop, left    wears a brace   Frequent falls 02/2018   poor balance, foot drop   GERD (gastroesophageal reflux disease)    Gout    Hypertension    Hypothyroidism    Insomnia    Low testosterone    Microscopic hematuria 2016   Myocardial infarction [facility])    patient unaware when it happened years ago.     Pneumonia  12/07/2014   Pressure ulcer 12/09/2014   Sepsis (HCC) 12/08/2014   Thyroid disease      Micro Data:  6/10: COVID/FLU/RSV PCR>>negative 6/10: RVP>> 6/10: Blood culture x2>> 6/10: Strep pneumo urinary antigen>> 6/10: Legionella urinary antigen>>  Antimicrobials:   Anti-infectives  (From admission, onward)    Start     Dose/Rate Route Frequency Ordered Stop   07/17/22 0545  cefTRIAXone (ROCEPHIN) 2 g in sodium chloride 0.9 % 100 mL IVPB        2 g 200 mL/hr over 30 Minutes Intravenous Every 24 hours 07/17/22 0535 07/22/22 0544   07/17/22 0545  azithromycin (ZITHROMAX) 500 mg in sodium chloride 0.9 % 250 mL IVPB        500 mg 250 mL/hr over 60 Minutes Intravenous Every 24 hours 07/17/22 0535 07/22/22 0544       Significant Hospital Events: Including procedures, antibiotic start and stop dates in addition to other pertinent events   6/10: Presents to ED severely hypoxic, requiring HHFNC 60% fio2 & 40L.  PCCM asked to admit  Interim History / Subjective:  -Pt seen in ED, pt is awake and pleasant, slightly confused to situation and time, is a poor historian -On HHFNC 60% FiO2, in no respiratory distress ~ has since been weaned to 40% HHFNC -Hemodynamically stable, no vasopressors -CTA Chest negative for PE, concerning for multifocal pneumonia -COVID PCR is negative ~ suspect it may be too early in course for PCR to come back + as his wife and family tested + last Friday  Objective   Blood pressure (!) 117/47, pulse 79, temperature 100.1 F (37.8 C), temperature source Oral, resp. rate 17, SpO2 99 %.    FiO2 (%):  [60 %] 60 %   Intake/Output Summary (Last 24 hours) at 07/17/2022 0809 Last data filed at 07/17/2022 4098 Gross per 24 hour  Intake 100 ml  Output --  Net 100 ml   There were no vitals filed for this visit.  Examination: General: Acute on Chronically ill appearing male, laying in bed, on 40% HHNC, in NAD HENT: Atraumatic, normocephalic, neck supple, no JVD, dry MM Lungs: Coarse breath sounds throughout, no wheezing, even, nonlabored Cardiovascular: RRR, s1s2, no M/G/G Abdomen: Soft, nontender, nondistended, no guarding or rebound tenderness, BS+ x4 Extremities: No deformities, bilateral LE edema (L>R) Neuro: Sleeping, arouses easily to voice,  oriented to person an place, follows commands, no focal deficits noted, pupils PERRL GU: Deferred   Resolved Hospital Problem list     Assessment & Plan:   #Acute Hypoxic Respiratory Failure due to multifocal pneumonia #COPD without acute exacerbation CT Angio Chest on presentation negative for PE, concerning for multifocal pneumonia and emphysema -Supplemental O2 as needed to maintain O2 sats 88 to 92% -BiPAP if needed  (pt is DNR/DNI) -Follow intermittent Chest X-ray & ABG as needed -Bronchodilators  -IV Steroids -ABX as above -Diuresis as BP and renal function permits to maintain net even fluid status -Pulmonary toilet as able  #Multifocal Pneumonia, suspect due to viral Pneumonia COVID PCR is negative, however may be too early in clinical course as the pt's wife tested + for COVID last Friday 6/7 #Concern for possible infectious/inflammatory colitis  -Monitor fever curve -Trend WBC's & Procalcitonin -Follow cultures as above -Continue empiric Azithromycin & Ceftriaxone pending cultures & sensitivities -Follow inflammatory markers -Vitamin C & Zinc  #Acute Kidney Injury due to poor PO intake -Monitor I&O's / urinary output -Follow BMP -Ensure adequate renal perfusion -Avoid nephrotoxic agents as able -Replace  electrolytes as indicated -Gentle IV fluids  #Acute Metabolic Encephalopathy ~ IMPROVING -Treatment of metabolic derangements as outlined above -Provide supportive care -Promote normal sleep/wake cycle and family presence -Avoid sedating medications as able     Best Practice (right click and "Reselect all SmartList Selections" daily)   Diet/type: Regular consistency (see orders) DVT prophylaxis: prophylactic heparin  GI prophylaxis: N/A Lines: N/A Foley:  N/A Code Status:  DNR/DNI Last date of multidisciplinary goals of care discussion [N/A]  6/10: Updated pt's wife at bedside.  All questions answered.  She confirms pt is a DNR/DNI.  Would like to  continue current aggressive measures and allow time for outcomes.  Labs   CBC: Recent Labs  Lab 07/17/22 0434  WBC 10.9*  NEUTROABS 8.3*  HGB 14.0  HCT 44.4  MCV 92.5  PLT 175    Basic Metabolic Panel: Recent Labs  Lab 07/17/22 0434  NA 139  K 4.2  CL 103  CO2 27  GLUCOSE 133*  BUN 27*  CREATININE 1.82*  CALCIUM 8.7*   GFR: Estimated Creatinine Clearance: 32 mL/min (A) (by C-G formula based on SCr of 1.82 mg/dL (H)). Recent Labs  Lab 07/17/22 0434 07/17/22 0534 07/17/22 0735  WBC 10.9*  --   --   LATICACIDVEN  --  2.1* 1.7    Liver Function Tests: Recent Labs  Lab 07/17/22 0434  AST 23  ALT 16  ALKPHOS 108  BILITOT 0.8  PROT 7.2  ALBUMIN 3.9   No results for input(s): "LIPASE", "AMYLASE" in the last 168 hours. No results for input(s): "AMMONIA" in the last 168 hours.  ABG    Component Value Date/Time   HCO3 29.5 (H) 07/17/2022 0602   O2SAT 68.5 07/17/2022 0602     Coagulation Profile: Recent Labs  Lab 07/17/22 0434  INR 1.0    Cardiac Enzymes: No results for input(s): "CKTOTAL", "CKMB", "CKMBINDEX", "TROPONINI" in the last 168 hours.  HbA1C: Hgb A1c MFr Bld  Date/Time Value Ref Range Status  12/08/2014 02:02 AM 6.3 (H) 4.0 - 6.0 % Final    CBG: No results for input(s): "GLUCAP" in the last 168 hours.  Review of Systems:   Positives in BOLD:  Gen: Denies fever, chills, weight change, fatigue/malaise, night sweats HEENT: Denies blurred vision, double vision, hearing loss, tinnitus, sinus congestion, rhinorrhea, sore throat, neck stiffness, dysphagia PULM: Denies shortness of breath, cough, sputum production, hemoptysis, wheezing CV: Denies chest pain, edema, orthopnea, paroxysmal nocturnal dyspnea, palpitations GI: Denies abdominal pain, nausea, vomiting, diarrhea, hematochezia, melena, constipation, change in bowel habits GU: Denies dysuria, hematuria, polyuria, oliguria, urethral discharge Endocrine: Denies hot or cold  intolerance, polyuria, polyphagia or appetite change Derm: Denies rash, dry skin, scaling or peeling skin change Heme: Denies easy bruising, bleeding, bleeding gums Neuro: Denies headache, numbness, weakness, slurred speech, loss of memory or consciousness   Past Medical History:  He,  has a past medical history of Acute encephalopathy (12/08/2014), Anxiety, ARF (acute renal failure) (HCC) (12/08/2014), Back pain, Benign neoplasm of large bowel, Capsulitis, Chronic back pain, Coronavirus infection, Depression, Diabetes mellitus without complication (HCC), Dysphagia, Exostosis, Foot drop, left, Frequent falls (02/2018), GERD (gastroesophageal reflux disease), Gout, Hypertension, Hypothyroidism, Insomnia, Low testosterone, Microscopic hematuria (2016), Myocardial infarction (HCC), Pneumonia (12/07/2014), Pressure ulcer (12/09/2014), Sepsis (HCC) (12/08/2014), and Thyroid disease.   Surgical History:   Past Surgical History:  Procedure Laterality Date   APPENDECTOMY     BACK SURGERY  1995, 1996   x 2   CHOLECYSTECTOMY  ESOPHAGOGASTRODUODENOSCOPY (EGD) WITH PROPOFOL N/A 04/24/2017   Procedure: ESOPHAGOGASTRODUODENOSCOPY (EGD) WITH PROPOFOL;  Surgeon: Scot Jun, MD;  Location: [facility] ENDOSCOPY;  Service: Endoscopy;  Laterality: N/A;   EYE SURGERY Bilateral 1983, 1985   cataract extractions   SPINAL CORD STIMULATOR INSERTION N/A 03/13/2018   Procedure: SPINAL CORD STIMULATOR INSERTION;  Surgeon: Venetia Night, MD;  Location: [facility] ORS;  Service: Neurosurgery;  Laterality: N/A;     Social History:   reports that he has been smoking cigars and cigarettes. He has a 35.00 pack-year smoking history. He has never used smokeless tobacco. He reports current drug use. Drug: Oxycodone. He reports that he does not drink alcohol.   Family History:  His family history includes Diabetes in his father; Heart disease in his mother; Kidney cancer in his brother.   Allergies Allergies  Allergen  Reactions   Doxycycline    Ibuprofen Nausea Only   Sulfa Antibiotics Nausea Only     Home Medications  Prior to Admission medications   Medication Sig Start Date End Date Taking? Authorizing Provider  acetaminophen (TYLENOL) 500 MG tablet Take by mouth 2 (two) times daily as needed.    [provider]  allopurinol (ZYLOPRIM) 100 MG tablet TAKE 1 TABLET BY MOUTH EVERY OTHER DAY 03/13/22   Sherron Monday, MD  amLODipine (NORVASC) 10 MG tablet Take 10 mg by mouth daily.    [provider]  aspirin 81 MG tablet Take 81 mg by mouth daily.    [provider]  atorvastatin (LIPITOR) 10 MG tablet TAKE 1 TABLET BY MOUTH EVERYDAY AT BEDTIME 05/31/22   Sherron Monday, MD  famotidine (PEPCID) 20 MG tablet Take 1 tablet by mouth 2 (two) times daily. 05/09/22 05/09/23  [provider]  fluticasone (FLONASE) 50 MCG/ACT nasal spray Place into both nostrils. 02/10/22   [provider]  gabapentin (NEURONTIN) 600 MG tablet TAKE 1 TABLET BY MOUTH TWICE DAILY. 05/09/22   Orson Eva, NP  isosorbide mononitrate (IMDUR) 30 MG 24 hr tablet Take 1 tablet (30 mg total) by mouth daily. 04/18/22   Laurier Nancy, MD  levothyroxine (SYNTHROID, LEVOTHROID) 125 MCG tablet Take 125 mcg by mouth daily before breakfast.    [provider]  losartan (COZAAR) 100 MG tablet TAKE 1 TABLET BY MOUTH EVERY DAY 03/13/22   Sherron Monday, MD  mirabegron ER (MYRBETRIQ) 50 MG TB24 tablet Take 1 tablet (50 mg total) by mouth daily. 07/13/22   Stoioff, Verna Czech, MD  naloxone (NARCAN) nasal spray 4 mg/0.1 mL Place 1 spray into the nose as needed for up to 365 doses (for opioid-induced respiratory depresssion). In case of emergency (overdose), spray once into each nostril. If no response within 3 minutes, repeat application and call 911. 03/06/22 03/06/23  Delano Metz, MD  nirmatrelvir & ritonavir (PAXLOVID, 150/100,) 10 x 150 MG & 10 x 100MG  TBPK Take 2 tablets by mouth in the  morning and at bedtime. 07/14/22   Miki Kins, FNP  ondansetron (ZOFRAN ODT) 4 MG disintegrating tablet Take 1 tablet (4 mg total) by mouth every 8 (eight) hours as needed for nausea or vomiting. 08/21/16   Merrily Brittle, MD  Oxycodone HCl 10 MG TABS Take 1 tablet (10 mg total) by mouth every 6 (six) hours as needed. Must last 30 days. 07/09/22 08/08/22  Delano Metz, MD  Oxycodone HCl 10 MG TABS Take 1 tablet (10 mg total) by mouth every 6 (six) hours as needed. Must last 30 days.  08/08/22 09/07/22  Delano Metz, MD  sertraline (ZOLOFT) 50 MG tablet Take 1 tablet (50 mg total) by mouth at bedtime. 07/12/22 08/11/22  Neysa Hotter, MD  tamsulosin (FLOMAX) 0.4 MG CAPS capsule Take 0.4 mg by mouth daily. 11/08/20   [provider]  Testosterone 40.5 MG/2.5GM (1.62%) GEL Place 2 Pump onto the skin 3 (three) times a week.    [provider]  vitamin B-12 (CYANOCOBALAMIN) 1000 MCG tablet Take 1,000 mcg by mouth daily.    [provider]  Vitamin D, Ergocalciferol, (DRISDOL) 1.25 MG (50000 UNIT) CAPS capsule TAKE 1 CAPSULE BY MOUTH ONE TIME PER WEEK 05/31/22   Sherron Monday, MD  vitamin E 400 UNIT capsule Take 400 Units by mouth daily.    [provider]  diphenhydrAMINE (BENADRYL) 25 MG tablet Take 25 mg by mouth 2 (two) times daily.   01/27/19  [provider]     Critical care time: 50 minutes     Harlon Ditty, AGACNP-BC [facility] Prefer epic messenger for cross cover needs If after hours, please call E-link

## 2022-07-17 NOTE — ED Notes (Signed)
This RN and Casimiro Needle, RN changed patient's linens due to urine saturation, placed new chux on bed, and applied new clean brief.

## 2022-07-17 NOTE — ED Notes (Signed)
Notified Dr. Arnoldo Morale in person of patient's lactic level of 2.1.

## 2022-07-18 ENCOUNTER — Encounter: Payer: Self-pay | Admitting: Pulmonary Disease

## 2022-07-18 DIAGNOSIS — J9601 Acute respiratory failure with hypoxia: Secondary | ICD-10-CM

## 2022-07-18 DIAGNOSIS — A419 Sepsis, unspecified organism: Secondary | ICD-10-CM

## 2022-07-18 DIAGNOSIS — R6520 Severe sepsis without septic shock: Secondary | ICD-10-CM

## 2022-07-18 DIAGNOSIS — J189 Pneumonia, unspecified organism: Secondary | ICD-10-CM

## 2022-07-18 LAB — CULTURE, BLOOD (ROUTINE X 2)

## 2022-07-18 LAB — SARS CORONAVIRUS 2 BY RT PCR: SARS Coronavirus 2 by RT PCR: NEGATIVE

## 2022-07-18 LAB — RENAL FUNCTION PANEL
Albumin: 3.2 g/dL — ABNORMAL LOW (ref 3.5–5.0)
Anion gap: 6 (ref 5–15)
BUN: 20 mg/dL (ref 8–23)
CO2: 27 mmol/L (ref 22–32)
Calcium: 8.2 mg/dL — ABNORMAL LOW (ref 8.9–10.3)
Chloride: 107 mmol/L (ref 98–111)
Creatinine, Ser: 1.08 mg/dL (ref 0.61–1.24)
GFR, Estimated: >60 mL/min (ref >60)
Glucose, Bld: 119 mg/dL — ABNORMAL HIGH (ref 70–99)
Phosphorus: 2.8 mg/dL (ref 2.5–4.6)
Potassium: 3.7 mmol/L (ref 3.5–5.1)
Sodium: 140 mmol/L (ref 135–145)

## 2022-07-18 LAB — CBC
HCT: 38.2 % — ABNORMAL LOW (ref 39.0–52.0)
Hemoglobin: 12.6 g/dL — ABNORMAL LOW (ref 13.0–17.0)
MCH: 29.6 pg (ref 26.0–34.0)
MCHC: 33 g/dL (ref 30.0–36.0)
MCV: 89.7 fL (ref 80.0–100.0)
Platelets: 134 10*3/uL — ABNORMAL LOW (ref 150–400)
RBC: 4.26 MIL/uL (ref 4.22–5.81)
RDW: 13.4 % (ref 11.5–15.5)
WBC: 12.4 10*3/uL — ABNORMAL HIGH (ref 4.0–10.5)
nRBC: 0 % (ref 0.0–0.2)

## 2022-07-18 LAB — RESPIRATORY PANEL BY PCR

## 2022-07-18 LAB — GLUCOSE, CAPILLARY
Glucose-Capillary: 124 mg/dL — ABNORMAL HIGH (ref 70–99)
Glucose-Capillary: 171 mg/dL — ABNORMAL HIGH (ref 70–99)
Glucose-Capillary: 149 mg/dL — ABNORMAL HIGH (ref 70–99)
Glucose-Capillary: 174 mg/dL — ABNORMAL HIGH (ref 70–99)

## 2022-07-18 LAB — C-REACTIVE PROTEIN: CRP: 8.1 mg/dL — ABNORMAL HIGH (ref <1.0)

## 2022-07-18 LAB — LEGIONELLA PNEUMOPHILA SEROGP 1 UR AG: L. pneumophila Serogp 1 Ur Ag: NEGATIVE

## 2022-07-18 LAB — HEMOGLOBIN A1C
Hgb A1c MFr Bld: 5.6 % (ref 4.8–5.6)
Mean Plasma Glucose: 114.02 mg/dL

## 2022-07-18 LAB — MAGNESIUM: Magnesium: 2.2 mg/dL (ref 1.7–2.4)

## 2022-07-18 LAB — D-DIMER, QUANTITATIVE: D-Dimer, Quant: 0.68 ug/mL-FEU — ABNORMAL HIGH (ref 0.00–0.50)

## 2022-07-18 LAB — FIBRINOGEN: Fibrinogen: 486 mg/dL — ABNORMAL HIGH (ref 210–475)

## 2022-07-18 NOTE — Consult Note (Signed)
PHARMACY CONSULT NOTE  Pharmacy Consult for Electrolyte Monitoring and Replacement   Recent Labs: Potassium (mmol/L)  Date Value  07/18/2022 3.7   Magnesium (mg/dL)  Date Value  66/07/3014 2.2   Calcium (mg/dL)  Date Value  02/14/3233 8.2 (L)   Albumin (g/dL)  Date Value  57/32/2025 3.2 (L)   Phosphorus (mg/dL)  Date Value  42/70/6237 2.8   Sodium (mmol/L)  Date Value  07/18/2022 140     Assessment: 88 yo male presented to the ED with chief complaint of shortness of breath.  PMH includes CAD, HTN, HLD, and chronic pain.  Patient currently requiring oxygen supplementation by  HFNC and being admitted for treatment of acute respiratory failure.  Goal of Therapy:  Electrolytes WNL  Plan:  No electrolyte replacement indicated at this time Because this consult was generated as part of an ICU order set and patient is transferring pharmacy will sign off for now Please feel free to reach out if any further assistance is needed  Lowella Bandy ,PharmD Clinical Pharmacist 07/18/2022 7:01 AM

## 2022-07-18 NOTE — Progress Notes (Signed)
Progress Note    Chase Mendez  ZOX:[id number] DOB: [birth year]  DOA: 07/17/2022 PCP: Sherron Monday, MD      Brief Narrative:    Medical records reviewed and are as summarized below:  Chase Mendez is a 88 y.o. male  with a past medical history significant for CAD, MI, COPD, hypertension, hypothyroidism, gout, GERD, anxiety, chronic back pain, and frequent falls, who presented to the hospital with progressive shortness of breath, cough, generalized weakness, confusion and poor oral intake.  His family today prior to admission.  According to his wife, she and other family members recently tested for positive for COVID-19 infection on 07/14/2022.  However, patient tested negative for COVID-19 infection. He was started empirically on Paxlovid on 07/14/2022 which he took for about 2 days.  Oxygen saturation was 82% on 4 L/min oxygen in the ED.  He was tachypneic.  Lactic acid was 2.1 and WBC was 12.4. He was admitted to the hospital for severe sepsis secondary to multifocal pneumonia complicated by acute hypoxic respiratory failure.    Assessment/Plan:   Principal Problem:   Severe sepsis (HCC) Active Problems:   AKI (acute kidney injury) (HCC)   Multifocal pneumonia   Acute respiratory failure with hypoxia (HCC)   Body mass index is 26.58 kg/m.    Severe sepsis secondary to multifocal pneumonia: Continue empiric IV ceftriaxone and azithromycin.  Discontinue IV dexamethasone.  COVID-19 test was negative x 2.  Respiratory viral panel was negative.  Follow-up blood cultures.   Acute hypoxic respiratory failure: Initially required oxygen via heated humidified high flow nasal cannula (60% FiO2 and 40 L/min).  Oxygen therapy has been weaned down to 2 L/min via nasal cannula.  Taper off oxygen as able.   AKI: Improved.  Losartan on hold   General weakness, frequent falls at home: PT and OT evaluation   Other comorbidities include CAD, gout, anxiety   Diet Order              Diet heart healthy/carb modified Room service appropriate? Yes; Fluid consistency: Thin  Diet effective now                            Consultants: Intensivist  Procedures: None    Medications:    ascorbic acid  500 mg Oral Daily   aspirin EC  81 mg Oral Daily   Chlorhexidine Gluconate Cloth  6 each Topical Daily   famotidine  20 mg Oral Daily   gabapentin  600 mg Oral BID   heparin  5,000 Units Subcutaneous Q8H   insulin aspart  0-15 Units Subcutaneous TID WC   insulin aspart  0-5 Units Subcutaneous QHS   ipratropium-albuterol  3 mL Nebulization BID   levothyroxine  125 mcg Oral Q0600   nicotine  14 mg Transdermal Daily   sertraline  50 mg Oral QHS   tamsulosin  0.4 mg Oral Daily   zinc sulfate  220 mg Oral Daily   Continuous Infusions:  sodium chloride 50 mL/hr at 07/18/22 1151   azithromycin Stopped (07/18/22 0730)   cefTRIAXone (ROCEPHIN)  IV Stopped (07/18/22 0552)     Anti-infectives (From admission, onward)    Start     Dose/Rate Route Frequency Ordered Stop   07/17/22 0545  cefTRIAXone (ROCEPHIN) 2 g in sodium chloride 0.9 % 100 mL IVPB        2 g 200 mL/hr over 30 Minutes Intravenous Every 24  hours 07/17/22 0535 07/22/22 0544   07/17/22 0545  azithromycin (ZITHROMAX) 500 mg in sodium chloride 0.9 % 250 mL IVPB        500 mg 250 mL/hr over 60 Minutes Intravenous Every 24 hours 07/17/22 0535 07/22/22 0544              Family Communication/Anticipated D/C date and plan/Code Status   DVT prophylaxis: heparin injection 5,000 Units Start: 07/17/22 1400 SCDs Start: 07/17/22 4098     Code Status: DNR  Family Communication: Plan discussed with his wife at the bedsdide Disposition Plan: Plan to discharge home in 2 to 3 days   Status is: Inpatient Remains inpatient appropriate because: Severe sepsis, pneumonia and hypoxic respiratory failure       Subjective:   C/o cough. No shortness of breath. Wife at the  bedside  Objective:    Vitals:   07/18/22 0900 07/18/22 1000 07/18/22 1100 07/18/22 1154  BP: (!) 129/52 (!) 137/57 (!) 123/51 (!) 138/58  Pulse: 83 78 68 78  Resp: 20 (!) 25 (!) 22 20  Temp:    99.2 F (37.3 C)  TempSrc:    Oral  SpO2: 94% 90% 94% 95%  Weight:      Height:       No data found.   Intake/Output Summary (Last 24 hours) at 07/18/2022 1252 Last data filed at 07/18/2022 1006 Gross per 24 hour  Intake 1424.01 ml  Output 925 ml  Net 499.01 ml   Filed Weights   07/17/22 1540 07/18/22 0500  Weight: 87.8 kg 88.9 kg    Exam:  GEN: NAD SKIN: Warm and dry EYES: EOMI ENT: MMM CV: RRR PULM: Bilateral rales, no wheezing ABD: soft, ND, NT, +BS CNS: AAO x 3, non focal EXT: No edema or tenderness        Data Reviewed:   I have personally reviewed following labs and imaging studies:  Labs: Labs show the following:   Basic Metabolic Panel: Recent Labs  Lab 07/17/22 0434 07/18/22 0353  NA 139 140  K 4.2 3.7  CL 103 107  CO2 27 27  GLUCOSE 133* 119*  BUN 27* 20  CREATININE 1.82* 1.08  CALCIUM 8.7* 8.2*  MG  --  2.2  PHOS  --  2.8   GFR Estimated Creatinine Clearance: 53.9 mL/min (by C-G formula based on SCr of 1.08 mg/dL). Liver Function Tests: Recent Labs  Lab 07/17/22 0434 07/18/22 0353  AST 23  --   ALT 16  --   ALKPHOS 108  --   BILITOT 0.8  --   PROT 7.2  --   ALBUMIN 3.9 3.2*   No results for input(s): "LIPASE", "AMYLASE" in the last 168 hours. No results for input(s): "AMMONIA" in the last 168 hours. Coagulation profile Recent Labs  Lab 07/17/22 0434  INR 1.0    CBC: Recent Labs  Lab 07/17/22 0434 07/18/22 0353  WBC 10.9* 12.4*  NEUTROABS 8.3*  --   HGB 14.0 12.6*  HCT 44.4 38.2*  MCV 92.5 89.7  PLT 175 134*   Cardiac Enzymes: No results for input(s): "CKTOTAL", "CKMB", "CKMBINDEX", "TROPONINI" in the last 168 hours. BNP (last 3 results) No results for input(s): "PROBNP" in the last 8760 hours. CBG: Recent  Labs  Lab 07/17/22 1547 07/17/22 2135 07/18/22 0802 07/18/22 1211  GLUCAP 177* 154* 124* 171*   D-Dimer: Recent Labs    07/17/22 0735 07/18/22 0353  DDIMER 1.00* 0.68*   Hgb A1c: Recent Labs  07/18/22 0353  HGBA1C 5.6   Lipid Profile: No results for input(s): "CHOL", "HDL", "LDLCALC", "TRIG", "CHOLHDL", "LDLDIRECT" in the last 72 hours. Thyroid function studies: No results for input(s): "TSH", "T4TOTAL", "T3FREE", "THYROIDAB" in the last 72 hours.  Invalid input(s): "FREET3" Anemia work up: No results for input(s): "VITAMINB12", "FOLATE", "FERRITIN", "TIBC", "IRON", "RETICCTPCT" in the last 72 hours. Sepsis Labs: Recent Labs  Lab 07/17/22 0434 07/17/22 0534 07/17/22 0735 07/18/22 0353  PROCALCITON  --   --  <0.10  --   WBC 10.9*  --   --  12.4*  LATICACIDVEN  --  2.1* 1.7  --     Microbiology Recent Results (from the past 240 hour(s))  Microscopic Examination     Status: Abnormal   Collection Time: 07/12/22 10:32 AM   Urine  Result Value Ref Range Status   WBC, UA 0-5 0 - 5 /hpf Final   RBC, Urine 0-2 0 - 2 /hpf Final   Epithelial Cells (non renal) 0-10 0 - 10 /hpf Final   Casts Present (A) None seen /lpf Final   Cast Type Hyaline casts N/A Final   Mucus, UA Present (A) Not Estab. Final   Bacteria, UA Moderate (A) None seen/Few Final  Blood Culture (routine x 2)     Status: None (Preliminary result)   Collection Time: 07/17/22  4:34 AM   Specimen: BLOOD  Result Value Ref Range Status   Specimen Description BLOOD RIGHT FA  Final   Special Requests   Final    BOTTLES DRAWN AEROBIC AND ANAEROBIC Blood Culture adequate volume   Culture   Final    NO GROWTH 1 DAY Performed at [facility], [address] [place], [place] [address]    Report Status PENDING  Incomplete  Blood Culture (routine x 2)     Status: None (Preliminary result)   Collection Time: 07/17/22  5:34 AM   Specimen: BLOOD  Result Value Ref Range Status   Specimen Description  BLOOD LEFT AC  Final   Special Requests   Final    BOTTLES DRAWN AEROBIC AND ANAEROBIC Blood Culture results may not be optimal due to an excessive volume of blood received in culture bottles   Culture   Final    NO GROWTH 1 DAY Performed at [facility], [address], [place], [place] [address]    Report Status PENDING  Incomplete  Resp panel by RT-PCR (RSV, Flu A&B, Covid) Anterior Nasal Swab     Status: None   Collection Time: 07/17/22  6:02 AM   Specimen: Anterior Nasal Swab  Result Value Ref Range Status   SARS Coronavirus 2 by RT PCR NEGATIVE NEGATIVE Final    Comment: (NOTE) SARS-CoV-2 target nucleic acids are NOT DETECTED.  The SARS-CoV-2 RNA is generally detectable in upper respiratory specimens during the acute phase of infection. The lowest concentration of SARS-CoV-2 viral copies this assay can detect is 138 copies/mL. A negative result does not preclude SARS-Cov-2 infection and should not be used as the sole basis for treatment or other patient management decisions. A negative result may occur with  improper specimen collection/handling, submission of specimen other than nasopharyngeal swab, presence of viral mutation(s) within the areas targeted by this assay, and inadequate number of viral copies(<138 copies/mL). A negative result must be combined with clinical observations, patient history, and epidemiological information. The expected result is Negative.  Fact Sheet for Patients:  BloggerCourse.com  Fact Sheet for Healthcare Providers:  SeriousBroker.it  This test  is no t yet approved or cleared by the [place] and  has been authorized for detection and/or diagnosis of SARS-CoV-2 by FDA under an Emergency Use Authorization (EUA). This EUA will remain  in effect (meaning this test can be used) for the duration of the COVID-19 declaration under Section 564(b)(1) of the Act, 21 U.S.C.section  360bbb-3(b)(1), unless the authorization is terminated  or revoked sooner.       Influenza A by PCR NEGATIVE NEGATIVE Final   Influenza B by PCR NEGATIVE NEGATIVE Final    Comment: (NOTE) The Xpert Xpress SARS-CoV-2/FLU/RSV plus assay is intended as an aid in the diagnosis of influenza from Nasopharyngeal swab specimens and should not be used as a sole basis for treatment. Nasal washings and aspirates are unacceptable for Xpert Xpress SARS-CoV-2/FLU/RSV testing.  Fact Sheet for Patients: BloggerCourse.com  Fact Sheet for Healthcare Providers: SeriousBroker.it  This test is not yet approved or cleared by the [place] FDA and has been authorized for detection and/or diagnosis of SARS-CoV-2 by FDA under an Emergency Use Authorization (EUA). This EUA will remain in effect (meaning this test can be used) for the duration of the COVID-19 declaration under Section 564(b)(1) of the Act, 21 U.S.C. section 360bbb-3(b)(1), unless the authorization is terminated or revoked.     Resp Syncytial Virus by PCR NEGATIVE NEGATIVE Final    Comment: (NOTE) Fact Sheet for Patients: BloggerCourse.com  Fact Sheet for Healthcare Providers: SeriousBroker.it  This test is not yet approved or cleared by the [place] FDA and has been authorized for detection and/or diagnosis of SARS-CoV-2 by FDA under an Emergency Use Authorization (EUA). This EUA will remain in effect (meaning this test can be used) for the duration of the COVID-19 declaration under Section 564(b)(1) of the Act, 21 U.S.C. section 360bbb-3(b)(1), unless the authorization is terminated or revoked.  Performed at [facility], [address] [place], [place] [address]   MRSA Next Gen by PCR, Nasal     Status: None   Collection Time: 07/17/22  3:24 PM   Specimen: Nasal Mucosa; Nasal Swab  Result Value Ref Range  Status   MRSA by PCR Next Gen NOT DETECTED NOT DETECTED Final    Comment: (NOTE) The GeneXpert MRSA Assay (FDA approved for NASAL specimens only), is one component of a comprehensive MRSA colonization surveillance program. It is not intended to diagnose MRSA infection nor to guide or monitor treatment for MRSA infections. Test performance is not FDA approved in patients less than 29 years old. Performed at [facility], [address] [place], [place] [address]   Respiratory (~20 pathogens) panel by PCR     Status: None   Collection Time: 07/17/22 10:19 PM   Specimen: Nasopharyngeal Swab; Respiratory  Result Value Ref Range Status   Adenovirus NOT DETECTED NOT DETECTED Final   Coronavirus 229E NOT DETECTED NOT DETECTED Final    Comment: (NOTE) The Coronavirus on the Respiratory Panel, DOES NOT test for the novel  Coronavirus (2019 nCoV)    Coronavirus HKU1 NOT DETECTED NOT DETECTED Final   Coronavirus NL63 NOT DETECTED NOT DETECTED Final   Coronavirus OC43 NOT DETECTED NOT DETECTED Final   Metapneumovirus NOT DETECTED NOT DETECTED Final   Rhinovirus / Enterovirus NOT DETECTED NOT DETECTED Final   Influenza A NOT DETECTED NOT DETECTED Final   Influenza B NOT DETECTED NOT DETECTED Final   Parainfluenza Virus 1 NOT DETECTED NOT DETECTED Final   Parainfluenza Virus 2 NOT DETECTED NOT DETECTED Final  Parainfluenza Virus 3 NOT DETECTED NOT DETECTED Final   Parainfluenza Virus 4 NOT DETECTED NOT DETECTED Final   Respiratory Syncytial Virus NOT DETECTED NOT DETECTED Final   Bordetella pertussis NOT DETECTED NOT DETECTED Final   Bordetella Parapertussis NOT DETECTED NOT DETECTED Final   Chlamydophila pneumoniae NOT DETECTED NOT DETECTED Final   Mycoplasma pneumoniae NOT DETECTED NOT DETECTED Final    Comment: Performed at [facility], [address] [place], [place] [address]  SARS Coronavirus 2 by RT PCR (hospital order, performed in [facility] hospital lab)  *cepheid single result test* Anterior Nasal Swab     Status: None   Collection Time: 07/18/22 11:09 AM   Specimen: Anterior Nasal Swab  Result Value Ref Range Status   SARS Coronavirus 2 by RT PCR NEGATIVE NEGATIVE Final    Comment: (NOTE) SARS-CoV-2 target nucleic acids are NOT DETECTED.  The SARS-CoV-2 RNA is generally detectable in upper and lower respiratory specimens during the acute phase of infection. The lowest concentration of SARS-CoV-2 viral copies this assay can detect is 250 copies / mL. A negative result does not preclude SARS-CoV-2 infection and should not be used as the sole basis for treatment or other patient management decisions.  A negative result may occur with improper specimen collection / handling, submission of specimen other than nasopharyngeal swab, presence of viral mutation(s) within the areas targeted by this assay, and inadequate number of viral copies (<250 copies / mL). A negative result must be combined with clinical observations, patient history, and epidemiological information.  Fact Sheet for Patients:   RoadLapTop.co.za  Fact Sheet for Healthcare Providers: http://kim-miller.com/  This test is not yet approved or  cleared by the [place] FDA and has been authorized for detection and/or diagnosis of SARS-CoV-2 by FDA under an Emergency Use Authorization (EUA).  This EUA will remain in effect (meaning this test can be used) for the duration of the COVID-19 declaration under Section 564(b)(1) of the Act, 21 U.S.C. section 360bbb-3(b)(1), unless the authorization is terminated or revoked sooner.  Performed at [facility], [address] [place], [place] [address]     Procedures and diagnostic studies:  US Venous Img Lower Bilateral (DVT)  Result Date: 07/17/2022 CLINICAL DATA:  [id number] Swelling 86310 EXAM: BILATERAL LOWER EXTREMITY VENOUS DOPPLER ULTRASOUND TECHNIQUE: Gray-scale  sonography with graded compression, as well as color Doppler and duplex ultrasound were performed to evaluate the lower extremity deep venous systems from the level of the common femoral vein and including the common femoral, femoral, profunda femoral, popliteal and calf veins including the posterior tibial, peroneal and gastrocnemius veins when visible. The superficial great saphenous vein was also interrogated. Spectral Doppler was utilized to evaluate flow at rest and with distal augmentation maneuvers in the common femoral, femoral and popliteal veins. COMPARISON:  None Available. FINDINGS: RIGHT LOWER EXTREMITY Common Femoral Vein: No evidence of thrombus. Normal compressibility, respiratory phasicity and response to augmentation. Saphenofemoral Junction: No evidence of thrombus. Normal compressibility and flow on color Doppler imaging. Profunda Femoral Vein: No evidence of thrombus. Normal compressibility and flow on color Doppler imaging. Femoral Vein: No evidence of thrombus. Normal compressibility, respiratory phasicity and response to augmentation. Popliteal Vein: No evidence of thrombus. Normal compressibility, respiratory phasicity and response to augmentation. Calf Veins: Calf veins were poorly visualized on grayscale imaging, but were without evidence of thrombus on color Doppler evaluation Superficial Great Saphenous Vein: No evidence of thrombus. Normal compressibility. Venous Reflux:  None. Other Findings:  None. LEFT  LOWER EXTREMITY Common Femoral Vein: No evidence of thrombus. Normal compressibility, respiratory phasicity and response to augmentation. Saphenofemoral Junction: No evidence of thrombus. Normal compressibility and flow on color Doppler imaging. Profunda Femoral Vein: No evidence of thrombus. Normal compressibility and flow on color Doppler imaging. Femoral Vein: No evidence of thrombus. Normal compressibility, respiratory phasicity and response to augmentation. Popliteal Vein: No  evidence of thrombus. Normal compressibility, respiratory phasicity and response to augmentation. Calf Veins: Calf veins were poorly visualized on grayscale imaging, but were without evidence of thrombus on color Doppler evaluation Superficial Great Saphenous Vein: No evidence of thrombus. Normal compressibility. Venous Reflux:  None. Other Findings: Soft tissue swelling in the left-greater-than-right lower extremity. IMPRESSION: No evidence of deep venous thrombosis in either lower extremity. Electronically Signed   By: Lorenza Cambridge M.D.   On: 07/17/2022 15:43   CT Angio Chest PE W/Cm &/Or Wo Cm  Result Date: 07/17/2022 CLINICAL DATA:  Pulmonary embolism suspected, high probability EXAM: CT ANGIOGRAPHY CHEST WITH CONTRAST TECHNIQUE: Multidetector CT imaging of the chest was performed using the standard protocol during bolus administration of intravenous contrast. Multiplanar CT image reconstructions and MIPs were obtained to evaluate the vascular anatomy. RADIATION DOSE REDUCTION: This exam was performed according to the departmental dose-optimization program which includes automated exposure control, adjustment of the mA and/or kV according to patient size and/or use of iterative reconstruction technique. CONTRAST:  50mL OMNIPAQUE IOHEXOL 350 MG/ML SOLN COMPARISON:  None Available. FINDINGS: Cardiovascular: Conventional 3 vessel arch anatomy. Atherosclerotic calcifications throughout the aorta and along the coronary arteries. Mild cardiomegaly. The pulmonary arteries are well opacified to the proximal segmental level. No evidence of acute pulmonary embolus. No pericardial effusion. Mediastinum/Nodes: Unremarkable CT appearance of the thyroid gland. No suspicious mediastinal or hilar adenopathy. No soft tissue mediastinal mass. Small hiatal hernia. Lungs/Pleura: Multifocal ground-glass attenuation airspace opacities and small nodules throughout the right upper lobe in a peribronchovascular distribution.  Diffuse right lower lobe bronchial wall thickening with a combination of peribronchovascular patchy airspace opacities and volume loss. Similar but less impressive findings in the left lower lobe. Mild background of centrilobular pulmonary emphysema. No pleural effusion or pneumothorax. Upper Abdomen: Submucosal edema at the hepatic flexure and proximal ascending colon. Inflammatory stranding is present in the pericolonic fat. The liver demonstrates a cirrhotic morphology with faint nodularity and hypertrophy of the left lobe relative to the right. The gallbladder is surgically absent. Musculoskeletal: No chest wall abnormality. No acute or significant osseous findings. Epidural spinal stimulator in place. Review of the MIP images confirms the above findings. IMPRESSION: 1. CT findings are most consistent with multi lobar pneumonia involving the right upper, right lower and, to a lesser extent, the left lower lobes. 2. Negative for acute pulmonary embolus. 3. Additionally, the visualized hepatic flexure and proximal transverse colon are abnormal with diffuse wall thickening and pericolonic inflammatory stranding. Findings suggest active infectious/inflammatory colitis. 4. Aortic and coronary artery atherosclerotic vascular calcifications. 5. Mild centrilobular pulmonary emphysema. 6. Small hiatal hernia. Aortic Atherosclerosis (ICD10-I70.0) and Emphysema (ICD10-J43.9). Electronically Signed   By: Malachy Moan M.D.   On: 07/17/2022 08:19   DG Chest Port 1 View  Result Date: 07/17/2022 CLINICAL DATA:  Questionable sepsis EXAM: PORTABLE CHEST 1 VIEW COMPARISON:  01/27/2019 FINDINGS: Low volume chest with interstitial crowding at the bases. No edema, effusion, or pneumothorax. Normal heart size and mediastinal contours for technique. Spinal cord stimulator the lower thoracic level. IMPRESSION: Low volume chest with atelectatic type density at the bases. Early bronchopneumonia is  not excluded. Electronically  Signed   By: Tiburcio Pea M.D.   On: 07/17/2022 05:50               LOS: 1 day   Maigan Bittinger  [facility]   Pager on [email or web address]. If 7PM-7AM, please contact night-coverage at [email or web address]     07/18/2022, 12:52 PM

## 2022-07-18 NOTE — Progress Notes (Signed)
NAME:  Chase Mendez, MRN:  [id number], DOB:  Aug 31, 1934, LOS: 1 ADMISSION DATE:  07/17/2022, CONSULTATION DATE:  07/17/2022 REFERRING MD:  Dr. Arnoldo Morale, CHIEF COMPLAINT:  Shortness of breath, cough, weakness   Brief Pt Description / Synopsis:  88 y.o. male, DNR/DNI,  admitted with Acute Hypoxic Respiratory Failure due to multifocal pneumonia in setting of suspected viral pneumonia.  History of Present Illness:  Chase Mendez is a 88 year old male with a past medical history significant for CAD, MI, COPD, hypertension, hypothyroidism, GERD, anxiety, chronic back pain, and frequent falls who presents to [facility] ED on 07/17/2022 due to progressive shortness of breath, cough, weakness, poor p.o. intake, and status post fall yesterday prior to presentation.  Patient is a poor historian, therefore history is obtained from the patient's wife at bedside along with chart review.  Per his wife, over the past few days he has had decreased p.o. intake, weakness and fatigue, confusion, and status post a fall yesterday which he did not hit his head, just scraped his elbow.  She sent him in for evaluation as she was concerned he could possibly be having a stroke.  She reports that she and other family members recently tested positive for COVID last Friday 6/7, and that her husband was started empirically on Paxlovid outpatient.  Patient currently denies chest pain, palpitations, dizziness, shortness of breath, abdominal pain, nausea, vomiting, diarrhea, dysuria.  Upon examination in the ED no signs of trauma, and he exhibited no focal deficits   ED Course: Initial Vital Signs: Temperature 100.1 F, RR 22, Pulse 86, BP 126/77, SpO2 82% on room air Significant Labs: BNP 133, HS Troponin 6, WBC 10.9 with Neutrophilia, procalcitonin <0.10, lactic acid 2.1, Creatinine 1.82, D-dimer 1.0 UA negative for UTI COVID/FLU/RSV PCR negative Imaging Chest X-ray>>IMPRESSION: Low volume chest with atelectatic type density at the  bases. Early bronchopneumonia is not excluded. CT angio Chest>>IMPRESSION: 1. CT findings are most consistent with multi lobar pneumonia involving the right upper, right lower and, to a lesser extent, the left lower lobes. 2. Negative for acute pulmonary embolus. 3. Additionally, the visualized hepatic flexure and proximal transverse colon are abnormal with diffuse wall thickening and pericolonic inflammatory stranding. Findings suggest active infectious/inflammatory colitis. 4. Aortic and coronary artery atherosclerotic vascular calcifications. 5. Mild centrilobular pulmonary emphysema. 6. Small hiatal hernia. Medications Administered: 1L NS bolus, Azithromycin & Ceftriaxone  PCCM asked to admit for further workup and treatment.  Please see "Significant Hospital Events" section for full detailed hospital course.  07/18/22- patient is more improved this am.  He is weaned to 3L/min [place].  CRP is elevated consistent with viral LRTI.  PCCM will sign off today as [facility] has assumed care of patient.  Will be happy to follow up with patient on outpatient basis with [facility] pulmonology.    Pertinent  Medical History   Past Medical History:  Diagnosis Date   Acute encephalopathy 12/08/2014   Anxiety    ARF (acute renal failure) (HCC) 12/08/2014   Back pain    Benign neoplasm of large bowel    Capsulitis    fractured displaced metatarsal with capsulitis   Chronic back pain    Coronavirus infection    Depression    Diabetes mellitus without complication (HCC)    no medications currently   Dysphagia    Exostosis    painful, right hallux   Foot drop, left    wears a brace   Frequent falls 02/2018   poor balance, foot drop  GERD (gastroesophageal reflux disease)    Gout    Hypertension    Hypothyroidism    Insomnia    Low testosterone    Microscopic hematuria 2016   Myocardial infarction [facility])    patient unaware when it happened years ago.     Pneumonia 12/07/2014   Pressure ulcer  12/09/2014   Sepsis (HCC) 12/08/2014   Thyroid disease      Micro Data:  6/10: COVID/FLU/RSV PCR>>negative 6/10: RVP>> 6/10: Blood culture x2>> 6/10: Strep pneumo urinary antigen>> 6/10: Legionella urinary antigen>>  Antimicrobials:   Anti-infectives (From admission, onward)    Start     Dose/Rate Route Frequency Ordered Stop   07/17/22 0545  cefTRIAXone (ROCEPHIN) 2 g in sodium chloride 0.9 % 100 mL IVPB        2 g 200 mL/hr over 30 Minutes Intravenous Every 24 hours 07/17/22 0535 07/22/22 0544   07/17/22 0545  azithromycin (ZITHROMAX) 500 mg in sodium chloride 0.9 % 250 mL IVPB        500 mg 250 mL/hr over 60 Minutes Intravenous Every 24 hours 07/17/22 0535 07/22/22 0544       Significant Hospital Events: Including procedures, antibiotic start and stop dates in addition to other pertinent events   6/10: Presents to ED severely hypoxic, requiring HHFNC 60% fio2 & 40L.  PCCM asked to admit  Interim History / Subjective:  -Pt seen in ED, pt is awake and pleasant, slightly confused to situation and time, is a poor historian -On HHFNC 60% FiO2, in no respiratory distress ~ has since been weaned to 40% HHFNC -Hemodynamically stable, no vasopressors -CTA Chest negative for PE, concerning for multifocal pneumonia -COVID PCR is negative ~ suspect it may be too early in course for PCR to come back + as his wife and family tested + last Friday  Objective   Blood pressure 138/64, pulse 68, temperature 98 F (36.7 C), resp. rate 18, height 6' (1.829 m), weight 88.9 kg, SpO2 95 %.        Intake/Output Summary (Last 24 hours) at 07/18/2022 1628 Last data filed at 07/18/2022 1006 Gross per 24 hour  Intake 1424.01 ml  Output 925 ml  Net 499.01 ml    Filed Weights   07/17/22 1540 07/18/22 0500  Weight: 87.8 kg 88.9 kg    Examination: General: Acute on Chronically ill appearing male, laying in bed, on 40% HHNC, in NAD HENT: Atraumatic, normocephalic, neck supple, no JVD, dry  MM Lungs: Coarse breath sounds throughout, no wheezing, even, nonlabored Cardiovascular: RRR, s1s2, no M/G/G Abdomen: Soft, nontender, nondistended, no guarding or rebound tenderness, BS+ x4 Extremities: No deformities, bilateral LE edema (L>R) Neuro: Sleeping, arouses easily to voice, oriented to person an place, follows commands, no focal deficits noted, pupils PERRL GU: Deferred   Resolved Hospital Problem list     Assessment & Plan:   #Acute Hypoxic Respiratory Failure due to multifocal pneumonia #COPD without acute exacerbation CT Angio Chest on presentation negative for PE, concerning for multifocal pneumonia and emphysema -Supplemental O2 as needed to maintain O2 sats 88 to 92% -BiPAP if needed  (pt is DNR/DNI) -Follow intermittent Chest X-ray & ABG as needed -Bronchodilators  -IV Steroids -ABX as above -Diuresis as BP and renal function permits to maintain net even fluid status -Pulmonary toilet as able  #Multifocal Pneumonia, suspect due to viral Pneumonia COVID PCR is negative, however may be too early in clinical course as the pt's wife tested + for COVID last Friday 6/7 #  Concern for possible infectious/inflammatory colitis  -Monitor fever curve -Trend WBC's & Procalcitonin -Follow cultures as above -Continue empiric Azithromycin & Ceftriaxone pending cultures & sensitivities -Follow inflammatory markers -Vitamin C & Zinc  #Acute Kidney Injury due to poor PO intake -Monitor I&O's / urinary output -Follow BMP -Ensure adequate renal perfusion -Avoid nephrotoxic agents as able -Replace electrolytes as indicated -Gentle IV fluids  #Acute Metabolic Encephalopathy ~ IMPROVING -Treatment of metabolic derangements as outlined above -Provide supportive care -Promote normal sleep/wake cycle and family presence -Avoid sedating medications as able     Best Practice (right click and "Reselect all SmartList Selections" daily)   Diet/type: Regular consistency (see  orders) DVT prophylaxis: prophylactic heparin  GI prophylaxis: N/A Lines: N/A Foley:  N/A Code Status:  DNR/DNI Last date of multidisciplinary goals of care discussion [N/A]  6/10: Updated pt's wife at bedside.  All questions answered.  She confirms pt is a DNR/DNI.  Would like to continue current aggressive measures and allow time for outcomes.  Labs   CBC: Recent Labs  Lab 07/17/22 0434 07/18/22 0353  WBC 10.9* 12.4*  NEUTROABS 8.3*  --   HGB 14.0 12.6*  HCT 44.4 38.2*  MCV 92.5 89.7  PLT 175 134*     Basic Metabolic Panel: Recent Labs  Lab 07/17/22 0434 07/18/22 0353  NA 139 140  K 4.2 3.7  CL 103 107  CO2 27 27  GLUCOSE 133* 119*  BUN 27* 20  CREATININE 1.82* 1.08  CALCIUM 8.7* 8.2*  MG  --  2.2  PHOS  --  2.8    GFR: Estimated Creatinine Clearance: 53.9 mL/min (by C-G formula based on SCr of 1.08 mg/dL). Recent Labs  Lab 07/17/22 0434 07/17/22 0534 07/17/22 0735 07/18/22 0353  PROCALCITON  --   --  <0.10  --   WBC 10.9*  --   --  12.4*  LATICACIDVEN  --  2.1* 1.7  --      Liver Function Tests: Recent Labs  Lab 07/17/22 0434 07/18/22 0353  AST 23  --   ALT 16  --   ALKPHOS 108  --   BILITOT 0.8  --   PROT 7.2  --   ALBUMIN 3.9 3.2*    No results for input(s): "LIPASE", "AMYLASE" in the last 168 hours. No results for input(s): "AMMONIA" in the last 168 hours.  ABG    Component Value Date/Time   HCO3 29.5 (H) 07/17/2022 0602   O2SAT 68.5 07/17/2022 0602     Coagulation Profile: Recent Labs  Lab 07/17/22 0434  INR 1.0     Cardiac Enzymes: No results for input(s): "CKTOTAL", "CKMB", "CKMBINDEX", "TROPONINI" in the last 168 hours.  HbA1C: Hgb A1c MFr Bld  Date/Time Value Ref Range Status  07/18/2022 03:53 AM 5.6 4.8 - 5.6 % Final    Comment:    (NOTE) Pre diabetes:          5.7%-6.4%  Diabetes:              >6.4%  Glycemic control for   <7.0% adults with diabetes   12/08/2014 02:02 AM 6.3 (H) 4.0 - 6.0 % Final     CBG: Recent Labs  Lab 07/17/22 1547 07/17/22 2135 07/18/22 0802 07/18/22 1211 07/18/22 1619  GLUCAP 177* 154* 124* 171* 149*    Review of Systems:   Positives in BOLD:  Gen: Denies fever, chills, weight change, fatigue/malaise, night sweats HEENT: Denies blurred vision, double vision, hearing loss, tinnitus, sinus congestion, rhinorrhea, sore  throat, neck stiffness, dysphagia PULM: Denies shortness of breath, cough, sputum production, hemoptysis, wheezing CV: Denies chest pain, edema, orthopnea, paroxysmal nocturnal dyspnea, palpitations GI: Denies abdominal pain, nausea, vomiting, diarrhea, hematochezia, melena, constipation, change in bowel habits GU: Denies dysuria, hematuria, polyuria, oliguria, urethral discharge Endocrine: Denies hot or cold intolerance, polyuria, polyphagia or appetite change Derm: Denies rash, dry skin, scaling or peeling skin change Heme: Denies easy bruising, bleeding, bleeding gums Neuro: Denies headache, numbness, weakness, slurred speech, loss of memory or consciousness   Past Medical History:  He,  has a past medical history of Acute encephalopathy (12/08/2014), Anxiety, ARF (acute renal failure) (HCC) (12/08/2014), Back pain, Benign neoplasm of large bowel, Capsulitis, Chronic back pain, Coronavirus infection, Depression, Diabetes mellitus without complication (HCC), Dysphagia, Exostosis, Foot drop, left, Frequent falls (02/2018), GERD (gastroesophageal reflux disease), Gout, Hypertension, Hypothyroidism, Insomnia, Low testosterone, Microscopic hematuria (2016), Myocardial infarction (HCC), Pneumonia (12/07/2014), Pressure ulcer (12/09/2014), Sepsis (HCC) (12/08/2014), and Thyroid disease.   Surgical History:   Past Surgical History:  Procedure Laterality Date   APPENDECTOMY     BACK SURGERY  1995, 1996   x 2   CHOLECYSTECTOMY     ESOPHAGOGASTRODUODENOSCOPY (EGD) WITH PROPOFOL N/A 04/24/2017   Procedure: ESOPHAGOGASTRODUODENOSCOPY (EGD) WITH  PROPOFOL;  Surgeon: Scot Jun, MD;  Location: [facility] ENDOSCOPY;  Service: Endoscopy;  Laterality: N/A;   EYE SURGERY Bilateral 1983, 1985   cataract extractions   SPINAL CORD STIMULATOR INSERTION N/A 03/13/2018   Procedure: SPINAL CORD STIMULATOR INSERTION;  Surgeon: Venetia Night, MD;  Location: [facility] ORS;  Service: Neurosurgery;  Laterality: N/A;     Social History:   reports that he has been smoking cigars and cigarettes. He has a 35.00 pack-year smoking history. He has never used smokeless tobacco. He reports current drug use. Drug: Oxycodone. He reports that he does not drink alcohol.   Family History:  His family history includes Diabetes in his father; Heart disease in his mother; Kidney cancer in his brother.   Allergies Allergies  Allergen Reactions   Doxycycline Nausea Only   Ibuprofen Nausea Only   Sulfa Antibiotics Nausea Only     Home Medications  Prior to Admission medications   Medication Sig Start Date End Date Taking? Authorizing Provider  acetaminophen (TYLENOL) 500 MG tablet Take by mouth 2 (two) times daily as needed.    [provider]  allopurinol (ZYLOPRIM) 100 MG tablet TAKE 1 TABLET BY MOUTH EVERY OTHER DAY 03/13/22   Sherron Monday, MD  amLODipine (NORVASC) 10 MG tablet Take 10 mg by mouth daily.    [provider]  aspirin 81 MG tablet Take 81 mg by mouth daily.    [provider]  atorvastatin (LIPITOR) 10 MG tablet TAKE 1 TABLET BY MOUTH EVERYDAY AT BEDTIME 05/31/22   Sherron Monday, MD  famotidine (PEPCID) 20 MG tablet Take 1 tablet by mouth 2 (two) times daily. 05/09/22 05/09/23  [provider]  fluticasone (FLONASE) 50 MCG/ACT nasal spray Place into both nostrils. 02/10/22   [provider]  gabapentin (NEURONTIN) 600 MG tablet TAKE 1 TABLET BY MOUTH TWICE DAILY. 05/09/22   Orson Eva, NP  isosorbide mononitrate (IMDUR) 30 MG 24 hr tablet Take 1 tablet (30 mg total) by mouth daily. 04/18/22   Laurier Nancy, MD  levothyroxine (SYNTHROID, LEVOTHROID) 125 MCG tablet Take 125 mcg by mouth daily before breakfast.    [provider]  losartan (COZAAR) 100 MG tablet TAKE 1 TABLET BY MOUTH EVERY DAY 03/13/22  Sherron Monday, MD  mirabegron ER (MYRBETRIQ) 50 MG TB24 tablet Take 1 tablet (50 mg total) by mouth daily. 07/13/22   Stoioff, Verna Czech, MD  naloxone (NARCAN) nasal spray 4 mg/0.1 mL Place 1 spray into the nose as needed for up to 365 doses (for opioid-induced respiratory depresssion). In case of emergency (overdose), spray once into each nostril. If no response within 3 minutes, repeat application and call 911. 03/06/22 03/06/23  Delano Metz, MD  nirmatrelvir & ritonavir (PAXLOVID, 150/100,) 10 x 150 MG & 10 x 100MG  TBPK Take 2 tablets by mouth in the morning and at bedtime. 07/14/22   Miki Kins, FNP  ondansetron (ZOFRAN ODT) 4 MG disintegrating tablet Take 1 tablet (4 mg total) by mouth every 8 (eight) hours as needed for nausea or vomiting. 08/21/16   Merrily Brittle, MD  Oxycodone HCl 10 MG TABS Take 1 tablet (10 mg total) by mouth every 6 (six) hours as needed. Must last 30 days. 07/09/22 08/08/22  Delano Metz, MD  Oxycodone HCl 10 MG TABS Take 1 tablet (10 mg total) by mouth every 6 (six) hours as needed. Must last 30 days. 08/08/22 09/07/22  Delano Metz, MD  sertraline (ZOLOFT) 50 MG tablet Take 1 tablet (50 mg total) by mouth at bedtime. 07/12/22 08/11/22  Neysa Hotter, MD  tamsulosin (FLOMAX) 0.4 MG CAPS capsule Take 0.4 mg by mouth daily. 11/08/20   [provider]  Testosterone 40.5 MG/2.5GM (1.62%) GEL Place 2 Pump onto the skin 3 (three) times a week.    [provider]  vitamin B-12 (CYANOCOBALAMIN) 1000 MCG tablet Take 1,000 mcg by mouth daily.    [provider]  Vitamin D, Ergocalciferol, (DRISDOL) 1.25 MG (50000 UNIT) CAPS capsule TAKE 1 CAPSULE BY MOUTH ONE TIME PER WEEK 05/31/22   Sherron Monday, MD  vitamin E 400 UNIT capsule  Take 400 Units by mouth daily.    [provider]  diphenhydrAMINE (BENADRYL) 25 MG tablet Take 25 mg by mouth 2 (two) times daily.   01/27/19  [provider]     Vida Rigger, M.D.  Pulmonary & Critical Care Medicine  [facility]

## 2022-07-19 DIAGNOSIS — A419 Sepsis, unspecified organism: Secondary | ICD-10-CM | POA: Diagnosis not present

## 2022-07-19 DIAGNOSIS — R6520 Severe sepsis without septic shock: Secondary | ICD-10-CM | POA: Diagnosis not present

## 2022-07-19 DIAGNOSIS — R531 Weakness: Secondary | ICD-10-CM | POA: Diagnosis not present

## 2022-07-19 LAB — RENAL FUNCTION PANEL
Albumin: 3.3 g/dL — ABNORMAL LOW (ref 3.5–5.0)
Anion gap: 8 (ref 5–15)
BUN: 19 mg/dL (ref 8–23)
CO2: 25 mmol/L (ref 22–32)
Calcium: 8.5 mg/dL — ABNORMAL LOW (ref 8.9–10.3)
Chloride: 103 mmol/L (ref 98–111)
Creatinine, Ser: 0.85 mg/dL (ref 0.61–1.24)
GFR, Estimated: >60 mL/min (ref >60)
Glucose, Bld: 152 mg/dL — ABNORMAL HIGH (ref 70–99)
Phosphorus: 2.6 mg/dL (ref 2.5–4.6)
Potassium: 4 mmol/L (ref 3.5–5.1)
Sodium: 136 mmol/L (ref 135–145)

## 2022-07-19 LAB — CBC
HCT: 40.3 % (ref 39.0–52.0)
Hemoglobin: 13.5 g/dL (ref 13.0–17.0)
MCH: 29.1 pg (ref 26.0–34.0)
MCHC: 33.5 g/dL (ref 30.0–36.0)
MCV: 86.9 fL (ref 80.0–100.0)
Platelets: 147 10*3/uL — ABNORMAL LOW (ref 150–400)
RBC: 4.64 MIL/uL (ref 4.22–5.81)
RDW: 13.1 % (ref 11.5–15.5)
WBC: 14.2 10*3/uL — ABNORMAL HIGH (ref 4.0–10.5)
nRBC: 0 % (ref 0.0–0.2)

## 2022-07-19 LAB — C-REACTIVE PROTEIN: CRP: 6.6 mg/dL — ABNORMAL HIGH (ref <1.0)

## 2022-07-19 LAB — GLUCOSE, CAPILLARY
Glucose-Capillary: 132 mg/dL — ABNORMAL HIGH (ref 70–99)
Glucose-Capillary: 158 mg/dL — ABNORMAL HIGH (ref 70–99)

## 2022-07-19 MED ORDER — AZITHROMYCIN 500 MG PO TABS: 500.0000 mg | ORAL_TABLET | Freq: Every day | ORAL | 0 refills | Status: AC

## 2022-07-19 NOTE — Evaluation (Addendum)
Physical Therapy Evaluation Patient Details Name: Chase Mendez MRN: [id number] DOB: 23-May-1934 Today's Date: 07/19/2022  History of Present Illness  88 y.o. male admitted with Acute Hypoxic Respiratory Failure due to multifocal pneumonia in setting of suspected viral pneumonia. PMH CAD, MI, COPD, hypertension, hypothyroidism, GERD, anxiety, chronic back pain, and frequent falls  Clinical Impression  Pt is upright in bed and A&Ox4. Pt denies any pain. PTA pt was modI with mobility using a 4WW at baseline and performed most ADL's modI except dressing c/ minA. Pt reports living at home in a single story house with wife and daughter, no STE. Pt was able to perform bed mobility and ambulation c/ CGA and RW. Pt was able to perform transfers, sit<>stand from EOB to RW c/ modA after a few attempts. Pt SpO2 maintained above 92 throughout session and was on 3L O2. Pt left in chair with OT in room. Pt would benefit from skilled PT services to improve functional mobility, independence, and return to PLOF.      Recommendations for follow up therapy are one component of a multi-disciplinary discharge planning process, led by the attending physician.  Recommendations may be updated based on patient status, additional functional criteria and insurance authorization.  Follow Up Recommendations       Assistance Recommended at Discharge Intermittent Supervision/Assistance  Patient can return home with the following  A little help with walking and/or transfers;A little help with bathing/dressing/bathroom;Assist for transportation;Direct supervision/assist for medications management;Help with stairs or ramp for entrance;Assistance with cooking/housework    Equipment Recommendations    Recommendations for Other Services       Functional Status Assessment Patient has had a recent decline in their functional status and demonstrates the ability to make significant improvements in function in a reasonable and  predictable amount of time.     Precautions / Restrictions Precautions Precautions: Fall Restrictions Weight Bearing Restrictions: No      Mobility  Bed Mobility Overal bed mobility: Needs Assistance Bed Mobility: Supine to Sit     Supine to sit: HOB elevated, Min guard          Transfers Overall transfer level: Needs assistance Equipment used: Rolling walker (2 wheels) Transfers: Sit to/from Stand Sit to Stand: Mod assist                Ambulation/Gait Ambulation/Gait assistance: Min guard Gait Distance (Feet): 200 Feet Assistive device: Rolling walker (2 wheels) Gait Pattern/deviations: Step-through pattern, Drifts right/left          Stairs            Wheelchair Mobility    Modified Rankin (Stroke Patients Only)       Balance Overall balance assessment: Needs assistance Sitting-balance support: Feet supported Sitting balance-Leahy Scale: Good     Standing balance support: Bilateral upper extremity supported Standing balance-Leahy Scale: Fair                               Pertinent Vitals/Pain Pain Assessment Pain Assessment: No/denies pain    Home Living Family/patient expects to be discharged to:: Private residence Living Arrangements: Spouse/significant other;Children Available Help at Discharge: Family;Available 24 hours/day Type of Home: House Home Access: Level entry       Home Layout: One level Home Equipment: Rollator (4 wheels);Shower seat;BSC/3in1      Prior Function Prior Level of Function : Independent/Modified Independent  Mobility Comments: Rollator at home ADLs Comments: MIN A from wife for lower body dressing. Reports MOD I for other ADLs     Hand Dominance   Dominant Hand: Right    Extremity/Trunk Assessment   Upper Extremity Assessment Upper Extremity Assessment: Overall WFL for tasks assessed    Lower Extremity Assessment Lower Extremity Assessment: Overall WFL for  tasks assessed       Communication   Communication: No difficulties  Cognition Arousal/Alertness: Awake/alert Behavior During Therapy: WFL for tasks assessed/performed Overall Cognitive Status: Within Functional Limits for tasks assessed                                          General Comments      Exercises     Assessment/Plan    PT Assessment Patient needs continued PT services  PT Problem List Decreased strength;Decreased range of motion;Decreased knowledge of use of DME;Decreased activity tolerance;Decreased safety awareness;Decreased balance;Decreased mobility;Decreased coordination       PT Treatment Interventions DME instruction;Balance training;Gait training;Neuromuscular re-education;Stair training;Functional mobility training;Therapeutic activities;Patient/family education;Therapeutic exercise    PT Goals (Current goals can be found in the Care Plan section)  Acute Rehab PT Goals Patient Stated Goal: to return to home PT Goal Formulation: With patient Time For Goal Achievement: 08/02/22 Potential to Achieve Goals: Good    Frequency Min 2X/week     Co-evaluation               AM-PAC PT "6 Clicks" Mobility  Outcome Measure Help needed turning from your back to your side while in a flat bed without using bedrails?: None Help needed moving from lying on your back to sitting on the side of a flat bed without using bedrails?: None Help needed moving to and from a bed to a chair (including a wheelchair)?: A Little Help needed standing up from a chair using your arms (e.g., wheelchair or bedside chair)?: A Lot Help needed to walk in hospital room?: A Little Help needed climbing 3-5 steps with a railing? : A Lot 6 Click Score: 18    End of Session Equipment Utilized During Treatment: Gait belt Activity Tolerance: Patient tolerated treatment well Patient left: in chair;Other (comment) (OT in room) Nurse Communication: Mobility status PT  Visit Diagnosis: Muscle weakness (generalized) (M62.81);Other abnormalities of gait and mobility (R26.89);Unsteadiness on feet (R26.81)    Time: 1610-9604 PT Time Calculation (min) (ACUTE ONLY): 25 min   Charges:             Lala Lund, PT, SPT  11:19 AM,07/19/22

## 2022-07-19 NOTE — Discharge Summary (Signed)
Physician Discharge Summary  Chase Mendez WUJ:[id number] DOB: 10/18/1934 DOA: 07/17/2022  PCP: Sherron Monday, MD  Admit date: 07/17/2022 Discharge date: 07/19/2022  Admitted From: home  Disposition:  home w/ home health   Recommendations for Outpatient Follow-up:  Follow up with PCP in 1-2 weeks   Home Health: yes Equipment/Devices:  Discharge Condition: stable  CODE STATUS: DNR Diet recommendation: Heart Healthy   Brief/Interim Summary: HPI was taken NP Elvina Sidle: Chase Mendez is a 88 year old male with a past medical history significant for CAD, MI, COPD, hypertension, hypothyroidism, GERD, anxiety, chronic back pain, and frequent falls who presents to [facility] ED on 07/17/2022 due to progressive shortness of breath, cough, weakness, poor p.o. intake, and status post fall yesterday prior to presentation.   Patient is a poor historian, therefore history is obtained from the patient's wife at bedside along with chart review.  Per his wife, over the past few days he has had decreased p.o. intake, weakness and fatigue, confusion, and status post a fall yesterday which he did not hit his head, just scraped his elbow.  She sent him in for evaluation as she was concerned he could possibly be having a stroke.  She reports that she and other family members recently tested positive for COVID last Friday 6/7, and that her husband was started empirically on Paxlovid outpatient.  Patient currently denies chest pain, palpitations, dizziness, shortness of breath, abdominal pain, nausea, vomiting, diarrhea, dysuria.  Upon examination in the ED no signs of trauma, and he exhibited no focal deficits    ED Course: Initial Vital Signs: Temperature 100.1 F, RR 22, Pulse 86, BP 126/77, SpO2 82% on room air Significant Labs: BNP 133, HS Troponin 6, WBC 10.9 with Neutrophilia, procalcitonin <0.10, lactic acid 2.1, Creatinine 1.82, D-dimer 1.0 UA negative for UTI COVID/FLU/RSV PCR negative Imaging Chest  X-ray>>IMPRESSION: Low volume chest with atelectatic type density at the bases. Early bronchopneumonia is not excluded. CT angio Chest>>IMPRESSION: 1. CT findings are most consistent with multi lobar pneumonia involving the right upper, right lower and, to a lesser extent, the left lower lobes. 2. Negative for acute pulmonary embolus. 3. Additionally, the visualized hepatic flexure and proximal transverse colon are abnormal with diffuse wall thickening and pericolonic inflammatory stranding. Findings suggest active infectious/inflammatory colitis. 4. Aortic and coronary artery atherosclerotic vascular calcifications. 5. Mild centrilobular pulmonary emphysema. 6. Small hiatal hernia. Medications Administered: 1L NS bolus, Azithromycin & Ceftriaxone  As per Dr. Karna Christmas  07/18/22- patient is more improved this am.  He is weaned to 3L/min [place].  CRP is elevated consistent with viral LRTI.  PCCM will sign off today as [facility] has assumed care of patient.  Will be happy to follow up with patient on outpatient basis with [facility] pulmonology.      Discharge Diagnoses:  Principal Problem:   Severe sepsis (HCC) Active Problems:   AKI (acute kidney injury) (HCC)   Multifocal pneumonia   Acute respiratory failure with hypoxia (HCC)  Severe sepsis: secondary to multifocal pneumonia. Continue empiric IV ceftriaxone and azithromycin. Steroids were d/c. D/c home w/ po azithromycin to complete the course. COVID-19 test was negative x 2.  Resp viral panel was negative.     Acute hypoxic respiratory failure: likely secondary to pneumonia. Weaned off of supplemental oxygen    AKI: resolved    General weakness: w/ frequent falls at home. PT/OT recs home health.   Discharge Instructions  Discharge Instructions     Diet - low sodium heart healthy  Complete by: As directed    Discharge instructions   Complete by: As directed    F/u w/ PCP in 1-2 weeks   Increase activity slowly   Complete by: As  directed       Allergies as of 07/19/2022       Reactions   Doxycycline Nausea Only   Ibuprofen Nausea Only   Sulfa Antibiotics Nausea Only        Medication List     STOP taking these medications    Paxlovid (150/100) 10 x 150 MG & 10 x 100MG  Tbpk Generic drug: nirmatrelvir & ritonavir       TAKE these medications    acetaminophen 500 MG tablet Commonly known as: TYLENOL Take 500-1,000 mg by mouth 2 (two) times daily as needed for mild pain.   allopurinol 100 MG tablet Commonly known as: ZYLOPRIM TAKE 1 TABLET BY MOUTH EVERY OTHER DAY   amLODipine 10 MG tablet Commonly known as: NORVASC Take 10 mg by mouth daily.   aspirin 81 MG tablet Take 81 mg by mouth daily.   atorvastatin 10 MG tablet Commonly known as: LIPITOR TAKE 1 TABLET BY MOUTH EVERYDAY AT BEDTIME   azithromycin 500 MG tablet Commonly known as: Zithromax Take 1 tablet (500 mg total) by mouth daily for 2 days.   cyanocobalamin 1000 MCG tablet Commonly known as: VITAMIN B12 Take 1,000 mcg by mouth daily.   famotidine 20 MG tablet Commonly known as: PEPCID Take 1 tablet by mouth 2 (two) times daily.   fluticasone 50 MCG/ACT nasal spray Commonly known as: FLONASE Place 1-2 sprays into both nostrils daily as needed for allergies or rhinitis.   gabapentin 600 MG tablet Commonly known as: NEURONTIN TAKE 1 TABLET BY MOUTH TWICE DAILY.   isosorbide mononitrate 30 MG 24 hr tablet Commonly known as: IMDUR Take 1 tablet (30 mg total) by mouth daily.   levothyroxine 125 MCG tablet Commonly known as: SYNTHROID Take 125 mcg by mouth daily before breakfast.   losartan 100 MG tablet Commonly known as: COZAAR TAKE 1 TABLET BY MOUTH EVERY DAY   mirabegron ER 50 MG Tb24 tablet Commonly known as: MYRBETRIQ Take 1 tablet (50 mg total) by mouth daily.   naloxone 4 MG/0.1ML Liqd nasal spray kit Commonly known as: NARCAN Place 1 spray into the nose as needed for up to 365 doses (for  opioid-induced respiratory depresssion). In case of emergency (overdose), spray once into each nostril. If no response within 3 minutes, repeat application and call 911.   ondansetron 4 MG disintegrating tablet Commonly known as: Zofran ODT Take 1 tablet (4 mg total) by mouth every 8 (eight) hours as needed for nausea or vomiting.   Oxycodone HCl 10 MG Tabs Take 1 tablet (10 mg total) by mouth every 6 (six) hours as needed. Must last 30 days. Start taking on: August 08, 2022 What changed: Another medication with the same name was removed. Continue taking this medication, and follow the directions you see here.   sertraline 50 MG tablet Commonly known as: Zoloft Take 1 tablet (50 mg total) by mouth at bedtime.   tamsulosin 0.4 MG Caps capsule Commonly known as: FLOMAX Take 0.4 mg by mouth daily.   Testosterone 40.5 MG/2.5GM (1.62%) Gel Place 2 Pump onto the skin 3 (three) times a week.   Vitamin D (Ergocalciferol) 1.25 MG (50000 UNIT) Caps capsule Commonly known as: DRISDOL TAKE 1 CAPSULE BY MOUTH ONE TIME PER WEEK   vitamin E 180 MG (400  UNITS) capsule Take 400 Units by mouth daily.        Allergies  Allergen Reactions   Doxycycline Nausea Only   Ibuprofen Nausea Only   Sulfa Antibiotics Nausea Only    Consultations: ICU   Procedures/Studies: US Venous Img Lower Bilateral (DVT)  Result Date: 07/17/2022 CLINICAL DATA:  [address] Swelling [id number] EXAM: BILATERAL LOWER EXTREMITY VENOUS DOPPLER ULTRASOUND TECHNIQUE: Gray-scale sonography with graded compression, as well as color Doppler and duplex ultrasound were performed to evaluate the lower extremity deep venous systems from the level of the common femoral vein and including the common femoral, femoral, profunda femoral, popliteal and calf veins including the posterior tibial, peroneal and gastrocnemius veins when visible. The superficial great saphenous vein was also interrogated. Spectral Doppler was utilized to evaluate flow  at rest and with distal augmentation maneuvers in the common femoral, femoral and popliteal veins. COMPARISON:  None Available. FINDINGS: RIGHT LOWER EXTREMITY Common Femoral Vein: No evidence of thrombus. Normal compressibility, respiratory phasicity and response to augmentation. Saphenofemoral Junction: No evidence of thrombus. Normal compressibility and flow on color Doppler imaging. Profunda Femoral Vein: No evidence of thrombus. Normal compressibility and flow on color Doppler imaging. Femoral Vein: No evidence of thrombus. Normal compressibility, respiratory phasicity and response to augmentation. Popliteal Vein: No evidence of thrombus. Normal compressibility, respiratory phasicity and response to augmentation. Calf Veins: Calf veins were poorly visualized on grayscale imaging, but were without evidence of thrombus on color Doppler evaluation Superficial Great Saphenous Vein: No evidence of thrombus. Normal compressibility. Venous Reflux:  None. Other Findings:  None. LEFT LOWER EXTREMITY Common Femoral Vein: No evidence of thrombus. Normal compressibility, respiratory phasicity and response to augmentation. Saphenofemoral Junction: No evidence of thrombus. Normal compressibility and flow on color Doppler imaging. Profunda Femoral Vein: No evidence of thrombus. Normal compressibility and flow on color Doppler imaging. Femoral Vein: No evidence of thrombus. Normal compressibility, respiratory phasicity and response to augmentation. Popliteal Vein: No evidence of thrombus. Normal compressibility, respiratory phasicity and response to augmentation. Calf Veins: Calf veins were poorly visualized on grayscale imaging, but were without evidence of thrombus on color Doppler evaluation Superficial Great Saphenous Vein: No evidence of thrombus. Normal compressibility. Venous Reflux:  None. Other Findings: Soft tissue swelling in the left-greater-than-right lower extremity. IMPRESSION: No evidence of deep venous  thrombosis in either lower extremity. Electronically Signed   By: Lorenza Cambridge M.D.   On: 07/17/2022 15:43   CT Angio Chest PE W/Cm &/Or Wo Cm  Result Date: 07/17/2022 CLINICAL DATA:  Pulmonary embolism suspected, high probability EXAM: CT ANGIOGRAPHY CHEST WITH CONTRAST TECHNIQUE: Multidetector CT imaging of the chest was performed using the standard protocol during bolus administration of intravenous contrast. Multiplanar CT image reconstructions and MIPs were obtained to evaluate the vascular anatomy. RADIATION DOSE REDUCTION: This exam was performed according to the departmental dose-optimization program which includes automated exposure control, adjustment of the mA and/or kV according to patient size and/or use of iterative reconstruction technique. CONTRAST:  50mL OMNIPAQUE IOHEXOL 350 MG/ML SOLN COMPARISON:  None Available. FINDINGS: Cardiovascular: Conventional 3 vessel arch anatomy. Atherosclerotic calcifications throughout the aorta and along the coronary arteries. Mild cardiomegaly. The pulmonary arteries are well opacified to the proximal segmental level. No evidence of acute pulmonary embolus. No pericardial effusion. Mediastinum/Nodes: Unremarkable CT appearance of the thyroid gland. No suspicious mediastinal or hilar adenopathy. No soft tissue mediastinal mass. Small hiatal hernia. Lungs/Pleura: Multifocal ground-glass attenuation airspace opacities and small nodules throughout the right upper lobe in a peribronchovascular distribution. Diffuse  right lower lobe bronchial wall thickening with a combination of peribronchovascular patchy airspace opacities and volume loss. Similar but less impressive findings in the left lower lobe. Mild background of centrilobular pulmonary emphysema. No pleural effusion or pneumothorax. Upper Abdomen: Submucosal edema at the hepatic flexure and proximal ascending colon. Inflammatory stranding is present in the pericolonic fat. The liver demonstrates a cirrhotic  morphology with faint nodularity and hypertrophy of the left lobe relative to the right. The gallbladder is surgically absent. Musculoskeletal: No chest wall abnormality. No acute or significant osseous findings. Epidural spinal stimulator in place. Review of the MIP images confirms the above findings. IMPRESSION: 1. CT findings are most consistent with multi lobar pneumonia involving the right upper, right lower and, to a lesser extent, the left lower lobes. 2. Negative for acute pulmonary embolus. 3. Additionally, the visualized hepatic flexure and proximal transverse colon are abnormal with diffuse wall thickening and pericolonic inflammatory stranding. Findings suggest active infectious/inflammatory colitis. 4. Aortic and coronary artery atherosclerotic vascular calcifications. 5. Mild centrilobular pulmonary emphysema. 6. Small hiatal hernia. Aortic Atherosclerosis (ICD10-I70.0) and Emphysema (ICD10-J43.9). Electronically Signed   By: Malachy Moan M.D.   On: 07/17/2022 08:19   DG Chest Port 1 View  Result Date: 07/17/2022 CLINICAL DATA:  Questionable sepsis EXAM: PORTABLE CHEST 1 VIEW COMPARISON:  01/27/2019 FINDINGS: Low volume chest with interstitial crowding at the bases. No edema, effusion, or pneumothorax. Normal heart size and mediastinal contours for technique. Spinal cord stimulator the lower thoracic level. IMPRESSION: Low volume chest with atelectatic type density at the bases. Early bronchopneumonia is not excluded. Electronically Signed   By: Tiburcio Pea M.D.   On: 07/17/2022 05:50   (Echo, Carotid, EGD, Colonoscopy, ERCP)    Subjective: Pt c/o fatigue    Discharge Exam: Vitals:   07/19/22 0813 07/19/22 1145  BP: 139/63 (!) 127/59  Pulse: 76 67  Resp: 20 16  Temp: 97.7 F (36.5 C) 97.7 F (36.5 C)  SpO2: 94% 97%   Vitals:   07/19/22 0500 07/19/22 0558 07/19/22 0813 07/19/22 1145  BP:  (!) 145/63 139/63 (!) 127/59  Pulse:  77 76 67  Resp:  20 20 16   Temp:  97.6 F  (36.4 C) 97.7 F (36.5 C) 97.7 F (36.5 C)  TempSrc:  Oral    SpO2:  96% 94% 97%  Weight: 88.5 kg     Height:        General: Pt is alert, awake, not in acute distress Cardiovascular: S1/S2 +, no rubs, no gallops Respiratory: decreased breath sounds b/l  Abdominal: Soft, NT, ND, bowel sounds + Extremities:  no cyanosis    The results of significant diagnostics from this hospitalization (including imaging, microbiology, ancillary and laboratory) are listed below for reference.     Microbiology: Recent Results (from the past 240 hour(s))  Microscopic Examination     Status: Abnormal   Collection Time: 07/12/22 10:32 AM   Urine  Result Value Ref Range Status   WBC, UA 0-5 0 - 5 /hpf Final   RBC, Urine 0-2 0 - 2 /hpf Final   Epithelial Cells (non renal) 0-10 0 - 10 /hpf Final   Casts Present (A) None seen /lpf Final   Cast Type Hyaline casts N/A Final   Mucus, UA Present (A) Not Estab. Final   Bacteria, UA Moderate (A) None seen/Few Final  Blood Culture (routine x 2)     Status: None (Preliminary result)   Collection Time: 07/17/22  4:34 AM   Specimen:  BLOOD  Result Value Ref Range Status   Specimen Description BLOOD RIGHT FA  Final   Special Requests   Final    BOTTLES DRAWN AEROBIC AND ANAEROBIC Blood Culture adequate volume   Culture   Final    NO GROWTH 2 DAYS Performed at [facility], [address] [place], [place] [address]    Report Status PENDING  Incomplete  Blood Culture (routine x 2)     Status: None (Preliminary result)   Collection Time: 07/17/22  5:34 AM   Specimen: BLOOD  Result Value Ref Range Status   Specimen Description BLOOD LEFT AC  Final   Special Requests   Final    BOTTLES DRAWN AEROBIC AND ANAEROBIC Blood Culture results may not be optimal due to an excessive volume of blood received in culture bottles   Culture   Final    NO GROWTH 2 DAYS Performed at [facility], [address], [place], [place] [address]     Report Status PENDING  Incomplete  Resp panel by RT-PCR (RSV, Flu A&B, Covid) Anterior Nasal Swab     Status: None   Collection Time: 07/17/22  6:02 AM   Specimen: Anterior Nasal Swab  Result Value Ref Range Status   SARS Coronavirus 2 by RT PCR NEGATIVE NEGATIVE Final    Comment: (NOTE) SARS-CoV-2 target nucleic acids are NOT DETECTED.  The SARS-CoV-2 RNA is generally detectable in upper respiratory specimens during the acute phase of infection. The lowest concentration of SARS-CoV-2 viral copies this assay can detect is 138 copies/mL. A negative result does not preclude SARS-Cov-2 infection and should not be used as the sole basis for treatment or other patient management decisions. A negative result may occur with  improper specimen collection/handling, submission of specimen other than nasopharyngeal swab, presence of viral mutation(s) within the areas targeted by this assay, and inadequate number of viral copies(<138 copies/mL). A negative result must be combined with clinical observations, patient history, and epidemiological information. The expected result is Negative.  Fact Sheet for Patients:  BloggerCourse.com  Fact Sheet for Healthcare Providers:  SeriousBroker.it  This test is no t yet approved or cleared by the [place] FDA and  has been authorized for detection and/or diagnosis of SARS-CoV-2 by FDA under an Emergency Use Authorization (EUA). This EUA will remain  in effect (meaning this test can be used) for the duration of the COVID-19 declaration under Section 564(b)(1) of the Act, 21 U.S.C.section 360bbb-3(b)(1), unless the authorization is terminated  or revoked sooner.       Influenza A by PCR NEGATIVE NEGATIVE Final   Influenza B by PCR NEGATIVE NEGATIVE Final    Comment: (NOTE) The Xpert Xpress SARS-CoV-2/FLU/RSV plus assay is intended as an aid in the diagnosis of influenza from Nasopharyngeal swab  specimens and should not be used as a sole basis for treatment. Nasal washings and aspirates are unacceptable for Xpert Xpress SARS-CoV-2/FLU/RSV testing.  Fact Sheet for Patients: BloggerCourse.com  Fact Sheet for Healthcare Providers: SeriousBroker.it  This test is not yet approved or cleared by the [place] FDA and has been authorized for detection and/or diagnosis of SARS-CoV-2 by FDA under an Emergency Use Authorization (EUA). This EUA will remain in effect (meaning this test can be used) for the duration of the COVID-19 declaration under Section 564(b)(1) of the Act, 21 U.S.C. section 360bbb-3(b)(1), unless the authorization is terminated or revoked.     Resp Syncytial Virus by PCR NEGATIVE NEGATIVE Final    Comment: (  NOTE) Fact Sheet for Patients: BloggerCourse.com  Fact Sheet for Healthcare Providers: SeriousBroker.it  This test is not yet approved or cleared by the [place] FDA and has been authorized for detection and/or diagnosis of SARS-CoV-2 by FDA under an Emergency Use Authorization (EUA). This EUA will remain in effect (meaning this test can be used) for the duration of the COVID-19 declaration under Section 564(b)(1) of the Act, 21 U.S.C. section 360bbb-3(b)(1), unless the authorization is terminated or revoked.  Performed at [facility], [address] [place], [place] [address]   MRSA Next Gen by PCR, Nasal     Status: None   Collection Time: 07/17/22  3:24 PM   Specimen: Nasal Mucosa; Nasal Swab  Result Value Ref Range Status   MRSA by PCR Next Gen NOT DETECTED NOT DETECTED Final    Comment: (NOTE) The GeneXpert MRSA Assay (FDA approved for NASAL specimens only), is one component of a comprehensive MRSA colonization surveillance program. It is not intended to diagnose MRSA infection nor to guide or monitor treatment for MRSA  infections. Test performance is not FDA approved in patients less than 4 years old. Performed at [facility], [address] [place], [place] [address]   Respiratory (~20 pathogens) panel by PCR     Status: None   Collection Time: 07/17/22 10:19 PM   Specimen: Nasopharyngeal Swab; Respiratory  Result Value Ref Range Status   Adenovirus NOT DETECTED NOT DETECTED Final   Coronavirus 229E NOT DETECTED NOT DETECTED Final    Comment: (NOTE) The Coronavirus on the Respiratory Panel, DOES NOT test for the novel  Coronavirus (2019 nCoV)    Coronavirus HKU1 NOT DETECTED NOT DETECTED Final   Coronavirus NL63 NOT DETECTED NOT DETECTED Final   Coronavirus OC43 NOT DETECTED NOT DETECTED Final   Metapneumovirus NOT DETECTED NOT DETECTED Final   Rhinovirus / Enterovirus NOT DETECTED NOT DETECTED Final   Influenza A NOT DETECTED NOT DETECTED Final   Influenza B NOT DETECTED NOT DETECTED Final   Parainfluenza Virus 1 NOT DETECTED NOT DETECTED Final   Parainfluenza Virus 2 NOT DETECTED NOT DETECTED Final   Parainfluenza Virus 3 NOT DETECTED NOT DETECTED Final   Parainfluenza Virus 4 NOT DETECTED NOT DETECTED Final   Respiratory Syncytial Virus NOT DETECTED NOT DETECTED Final   Bordetella pertussis NOT DETECTED NOT DETECTED Final   Bordetella Parapertussis NOT DETECTED NOT DETECTED Final   Chlamydophila pneumoniae NOT DETECTED NOT DETECTED Final   Mycoplasma pneumoniae NOT DETECTED NOT DETECTED Final    Comment: Performed at [facility], [address] [place], [place] [address]  SARS Coronavirus 2 by RT PCR (hospital order, performed in [facility] hospital lab) *cepheid single result test* Anterior Nasal Swab     Status: None   Collection Time: 07/18/22 11:09 AM   Specimen: Anterior Nasal Swab  Result Value Ref Range Status   SARS Coronavirus 2 by RT PCR NEGATIVE NEGATIVE Final    Comment: (NOTE) SARS-CoV-2 target nucleic acids are NOT DETECTED.  The SARS-CoV-2 RNA is  generally detectable in upper and lower respiratory specimens during the acute phase of infection. The lowest concentration of SARS-CoV-2 viral copies this assay can detect is 250 copies / mL. A negative result does not preclude SARS-CoV-2 infection and should not be used as the sole basis for treatment or other patient management decisions.  A negative result may occur with improper specimen collection / handling, submission of specimen other than nasopharyngeal swab, presence of viral mutation(s)  within the areas targeted by this assay, and inadequate number of viral copies (<250 copies / mL). A negative result must be combined with clinical observations, patient history, and epidemiological information.  Fact Sheet for Patients:   RoadLapTop.co.za  Fact Sheet for Healthcare Providers: http://kim-miller.com/  This test is not yet approved or  cleared by the [place] FDA and has been authorized for detection and/or diagnosis of SARS-CoV-2 by FDA under an Emergency Use Authorization (EUA).  This EUA will remain in effect (meaning this test can be used) for the duration of the COVID-19 declaration under Section 564(b)(1) of the Act, 21 U.S.C. section 360bbb-3(b)(1), unless the authorization is terminated or revoked sooner.  Performed at [facility], [address] [place], [place] [address]      Labs: BNP (last 3 results) Recent Labs    07/17/22 0534  BNP 133.2*   Basic Metabolic Panel: Recent Labs  Lab 07/17/22 0434 07/18/22 0353 07/19/22 0440  NA 139 140 136  K 4.2 3.7 4.0  CL 103 107 103  CO2 27 27 25   GLUCOSE 133* 119* 152*  BUN 27* 20 19  CREATININE 1.82* 1.08 0.85  CALCIUM 8.7* 8.2* 8.5*  MG  --  2.2  --   PHOS  --  2.8 2.6   Liver Function Tests: Recent Labs  Lab 07/17/22 0434 07/18/22 0353 07/19/22 0440  AST 23  --   --   ALT 16  --   --   ALKPHOS 108  --   --   BILITOT 0.8  --   --    PROT 7.2  --   --   ALBUMIN 3.9 3.2* 3.3*   No results for input(s): "LIPASE", "AMYLASE" in the last 168 hours. No results for input(s): "AMMONIA" in the last 168 hours. CBC: Recent Labs  Lab 07/17/22 0434 07/18/22 0353 07/19/22 0440  WBC 10.9* 12.4* 14.2*  NEUTROABS 8.3*  --   --   HGB 14.0 12.6* 13.5  HCT 44.4 38.2* 40.3  MCV 92.5 89.7 86.9  PLT 175 134* 147*   Cardiac Enzymes: No results for input(s): "CKTOTAL", "CKMB", "CKMBINDEX", "TROPONINI" in the last 168 hours. BNP: Invalid input(s): "POCBNP" CBG: Recent Labs  Lab 07/18/22 1211 07/18/22 1619 07/18/22 2025 07/19/22 0754 07/19/22 1158  GLUCAP 171* 149* 174* 132* 158*   D-Dimer Recent Labs    07/17/22 0735 07/18/22 0353  DDIMER 1.00* 0.68*   Hgb A1c Recent Labs    07/18/22 0353  HGBA1C 5.6   Lipid Profile No results for input(s): "CHOL", "HDL", "LDLCALC", "TRIG", "CHOLHDL", "LDLDIRECT" in the last 72 hours. Thyroid function studies No results for input(s): "TSH", "T4TOTAL", "T3FREE", "THYROIDAB" in the last 72 hours.  Invalid input(s): "FREET3" Anemia work up No results for input(s): "VITAMINB12", "FOLATE", "FERRITIN", "TIBC", "IRON", "RETICCTPCT" in the last 72 hours. Urinalysis    Component Value Date/Time   COLORURINE YELLOW (A) 07/17/2022 0601   APPEARANCEUR CLEAR (A) 07/17/2022 0601   APPEARANCEUR Clear 07/12/2022 1032   LABSPEC 1.021 07/17/2022 0601   PHURINE 5.0 07/17/2022 0601   GLUCOSEU NEGATIVE 07/17/2022 0601   HGBUR NEGATIVE 07/17/2022 0601   BILIRUBINUR NEGATIVE 07/17/2022 0601   BILIRUBINUR Negative 07/12/2022 1032   KETONESUR NEGATIVE 07/17/2022 0601   PROTEINUR NEGATIVE 07/17/2022 0601   NITRITE NEGATIVE 07/17/2022 0601   LEUKOCYTESUR NEGATIVE 07/17/2022 0601   Sepsis Labs Recent Labs  Lab 07/17/22 0434 07/18/22 0353 07/19/22 0440  WBC 10.9* 12.4* 14.2*   Microbiology Recent Results (from the past 240  hour(s))  Microscopic Examination     Status: Abnormal    Collection Time: 07/12/22 10:32 AM   Urine  Result Value Ref Range Status   WBC, UA 0-5 0 - 5 /hpf Final   RBC, Urine 0-2 0 - 2 /hpf Final   Epithelial Cells (non renal) 0-10 0 - 10 /hpf Final   Casts Present (A) None seen /lpf Final   Cast Type Hyaline casts N/A Final   Mucus, UA Present (A) Not Estab. Final   Bacteria, UA Moderate (A) None seen/Few Final  Blood Culture (routine x 2)     Status: None (Preliminary result)   Collection Time: 07/17/22  4:34 AM   Specimen: BLOOD  Result Value Ref Range Status   Specimen Description BLOOD RIGHT FA  Final   Special Requests   Final    BOTTLES DRAWN AEROBIC AND ANAEROBIC Blood Culture adequate volume   Culture   Final    NO GROWTH 2 DAYS Performed at [facility], [address] [place], [place] [address]    Report Status PENDING  Incomplete  Blood Culture (routine x 2)     Status: None (Preliminary result)   Collection Time: 07/17/22  5:34 AM   Specimen: BLOOD  Result Value Ref Range Status   Specimen Description BLOOD LEFT AC  Final   Special Requests   Final    BOTTLES DRAWN AEROBIC AND ANAEROBIC Blood Culture results may not be optimal due to an excessive volume of blood received in culture bottles   Culture   Final    NO GROWTH 2 DAYS Performed at [facility], [address] [place], [place] [address]    Report Status PENDING  Incomplete  Resp panel by RT-PCR (RSV, Flu A&B, Covid) Anterior Nasal Swab     Status: None   Collection Time: 07/17/22  6:02 AM   Specimen: Anterior Nasal Swab  Result Value Ref Range Status   SARS Coronavirus 2 by RT PCR NEGATIVE NEGATIVE Final    Comment: (NOTE) SARS-CoV-2 target nucleic acids are NOT DETECTED.  The SARS-CoV-2 RNA is generally detectable in upper respiratory specimens during the acute phase of infection. The lowest concentration of SARS-CoV-2 viral copies this assay can detect is 138 copies/mL. A negative result does not preclude SARS-Cov-2 infection and  should not be used as the sole basis for treatment or other patient management decisions. A negative result may occur with  improper specimen collection/handling, submission of specimen other than nasopharyngeal swab, presence of viral mutation(s) within the areas targeted by this assay, and inadequate number of viral copies(<138 copies/mL). A negative result must be combined with clinical observations, patient history, and epidemiological information. The expected result is Negative.  Fact Sheet for Patients:  BloggerCourse.com  Fact Sheet for Healthcare Providers:  SeriousBroker.it  This test is no t yet approved or cleared by the [place] FDA and  has been authorized for detection and/or diagnosis of SARS-CoV-2 by FDA under an Emergency Use Authorization (EUA). This EUA will remain  in effect (meaning this test can be used) for the duration of the COVID-19 declaration under Section 564(b)(1) of the Act, 21 U.S.C.section 360bbb-3(b)(1), unless the authorization is terminated  or revoked sooner.       Influenza A by PCR NEGATIVE NEGATIVE Final   Influenza B by PCR NEGATIVE NEGATIVE Final    Comment: (NOTE) The Xpert Xpress SARS-CoV-2/FLU/RSV plus assay is intended as an aid in the diagnosis of influenza from Nasopharyngeal swab specimens and  should not be used as a sole basis for treatment. Nasal washings and aspirates are unacceptable for Xpert Xpress SARS-CoV-2/FLU/RSV testing.  Fact Sheet for Patients: BloggerCourse.com  Fact Sheet for Healthcare Providers: SeriousBroker.it  This test is not yet approved or cleared by the [place] FDA and has been authorized for detection and/or diagnosis of SARS-CoV-2 by FDA under an Emergency Use Authorization (EUA). This EUA will remain in effect (meaning this test can be used) for the duration of the COVID-19 declaration  under Section 564(b)(1) of the Act, 21 U.S.C. section 360bbb-3(b)(1), unless the authorization is terminated or revoked.     Resp Syncytial Virus by PCR NEGATIVE NEGATIVE Final    Comment: (NOTE) Fact Sheet for Patients: BloggerCourse.com  Fact Sheet for Healthcare Providers: SeriousBroker.it  This test is not yet approved or cleared by the [place] FDA and has been authorized for detection and/or diagnosis of SARS-CoV-2 by FDA under an Emergency Use Authorization (EUA). This EUA will remain in effect (meaning this test can be used) for the duration of the COVID-19 declaration under Section 564(b)(1) of the Act, 21 U.S.C. section 360bbb-3(b)(1), unless the authorization is terminated or revoked.  Performed at [facility], [address] [place], [place] [address]   MRSA Next Gen by PCR, Nasal     Status: None   Collection Time: 07/17/22  3:24 PM   Specimen: Nasal Mucosa; Nasal Swab  Result Value Ref Range Status   MRSA by PCR Next Gen NOT DETECTED NOT DETECTED Final    Comment: (NOTE) The GeneXpert MRSA Assay (FDA approved for NASAL specimens only), is one component of a comprehensive MRSA colonization surveillance program. It is not intended to diagnose MRSA infection nor to guide or monitor treatment for MRSA infections. Test performance is not FDA approved in patients less than 69 years old. Performed at [facility], [address] [place], [place] [address]   Respiratory (~20 pathogens) panel by PCR     Status: None   Collection Time: 07/17/22 10:19 PM   Specimen: Nasopharyngeal Swab; Respiratory  Result Value Ref Range Status   Adenovirus NOT DETECTED NOT DETECTED Final   Coronavirus 229E NOT DETECTED NOT DETECTED Final    Comment: (NOTE) The Coronavirus on the Respiratory Panel, DOES NOT test for the novel  Coronavirus (2019 nCoV)    Coronavirus HKU1 NOT DETECTED NOT DETECTED Final    Coronavirus NL63 NOT DETECTED NOT DETECTED Final   Coronavirus OC43 NOT DETECTED NOT DETECTED Final   Metapneumovirus NOT DETECTED NOT DETECTED Final   Rhinovirus / Enterovirus NOT DETECTED NOT DETECTED Final   Influenza A NOT DETECTED NOT DETECTED Final   Influenza B NOT DETECTED NOT DETECTED Final   Parainfluenza Virus 1 NOT DETECTED NOT DETECTED Final   Parainfluenza Virus 2 NOT DETECTED NOT DETECTED Final   Parainfluenza Virus 3 NOT DETECTED NOT DETECTED Final   Parainfluenza Virus 4 NOT DETECTED NOT DETECTED Final   Respiratory Syncytial Virus NOT DETECTED NOT DETECTED Final   Bordetella pertussis NOT DETECTED NOT DETECTED Final   Bordetella Parapertussis NOT DETECTED NOT DETECTED Final   Chlamydophila pneumoniae NOT DETECTED NOT DETECTED Final   Mycoplasma pneumoniae NOT DETECTED NOT DETECTED Final    Comment: Performed at [facility], [address] [place], [place] [address]  SARS Coronavirus 2 by RT PCR (hospital order, performed in [facility] hospital lab) *cepheid single result test* Anterior Nasal Swab     Status: None   Collection Time: 07/18/22 11:09  AM   Specimen: Anterior Nasal Swab  Result Value Ref Range Status   SARS Coronavirus 2 by RT PCR NEGATIVE NEGATIVE Final    Comment: (NOTE) SARS-CoV-2 target nucleic acids are NOT DETECTED.  The SARS-CoV-2 RNA is generally detectable in upper and lower respiratory specimens during the acute phase of infection. The lowest concentration of SARS-CoV-2 viral copies this assay can detect is 250 copies / mL. A negative result does not preclude SARS-CoV-2 infection and should not be used as the sole basis for treatment or other patient management decisions.  A negative result may occur with improper specimen collection / handling, submission of specimen other than nasopharyngeal swab, presence of viral mutation(s) within the areas targeted by this assay, and inadequate number of viral copies (<250 copies / mL). A negative  result must be combined with clinical observations, patient history, and epidemiological information.  Fact Sheet for Patients:   RoadLapTop.co.za  Fact Sheet for Healthcare Providers: http://kim-miller.com/  This test is not yet approved or  cleared by the [place] FDA and has been authorized for detection and/or diagnosis of SARS-CoV-2 by FDA under an Emergency Use Authorization (EUA).  This EUA will remain in effect (meaning this test can be used) for the duration of the COVID-19 declaration under Section 564(b)(1) of the Act, 21 U.S.C. section 360bbb-3(b)(1), unless the authorization is terminated or revoked sooner.  Performed at [facility], [address] [place], [place] [address]      Time coordinating discharge: Over 30 minutes  SIGNED:   Charise Killian, MD  [facility] 07/19/2022, 1:41 PM Pager   If 7PM-7AM, please contact night-coverage [email or web address]

## 2022-07-19 NOTE — Evaluation (Signed)
Occupational Therapy Evaluation Patient Details Name: Chase Mendez MRN: [id number] DOB: May 29, 1934 Today's Date: 07/19/2022   History of Present Illness 88 y.o. male admitted with Acute Hypoxic Respiratory Failure due to multifocal pneumonia in setting of suspected viral pneumonia. PMH CAD, MI, COPD, hypertension, hypothyroidism, GERD, anxiety, chronic back pain, and frequent falls   Clinical Impression   Pt was seen for OT evaluation this date. Prior to hospital admission, pt was MOD I - MIN A with ADLs prior to admission. Pt lives with spouse in a walker assessable home. Pt presents to acute OT demonstrating impaired ADL performance and functional mobility 2/2 decreased activity tolerance (See OT problem list for additional functional deficits). Pt currently requires MIN A for upper body dressing, set up assist for eating. CGA to stand with RW from chair. CGA with RW for mobility in room. Pt c/o low vision and nausea during session as impeding on his functional performance. Pt would benefit from skilled OT services to address noted impairments and functional limitations (see below for any additional details) in order to maximize safety and independence while minimizing falls risk and caregiver burden. Do not anticipate the need for follow up OT services upon acute hospital DC.      Recommendations for follow up therapy are one component of a multi-disciplinary discharge planning process, led by the attending physician.  Recommendations may be updated based on patient status, additional functional criteria and insurance authorization.   Assistance Recommended at Discharge Set up Supervision/Assistance  Patient can return home with the following A little help with walking and/or transfers;A little help with bathing/dressing/bathroom;Assistance with cooking/housework;Assist for transportation;Help with stairs or ramp for entrance;Direct supervision/assist for medications management    Functional  Status Assessment  Patient has had a recent decline in their functional status and demonstrates the ability to make significant improvements in function in a reasonable and predictable amount of time.  Equipment Recommendations  None recommended by OT    Recommendations for Other Services       Precautions / Restrictions Precautions Precautions: Fall Restrictions Weight Bearing Restrictions: No      Mobility Bed Mobility               General bed mobility comments: did not assess    Transfers Overall transfer level: Needs assistance Equipment used: Rolling walker (2 wheels) Transfers: Sit to/from Stand Sit to Stand: Min guard                  Balance Overall balance assessment: Needs assistance Sitting-balance support: No upper extremity supported, Feet supported Sitting balance-Leahy Scale: Good Sitting balance - Comments: Upper body dressing while sitting in chair and eating meal without back support.   Standing balance support: Bilateral upper extremity supported, Reliant on assistive device for balance Standing balance-Leahy Scale: Fair                             ADL either performed or assessed with clinical judgement   ADL Overall ADL's : Needs assistance/impaired Eating/Feeding: Set up Eating/Feeding Details (indicate cue type and reason): Set up A due to vision deficits.     Upper Body Bathing: Minimal assistance                           Functional mobility during ADLs: Min guard;Rolling walker (2 wheels) General ADL Comments: CGA for stability. Set up in unfamiliar environment due to baseline  vision deficits.     Vision Baseline Vision/History: 2 Legally blind (per pt)       Perception     Praxis      Pertinent Vitals/Pain Pain Assessment Pain Assessment: No/denies pain     Hand Dominance Right   Extremity/Trunk Assessment Upper Extremity Assessment Upper Extremity Assessment: Overall WFL for tasks  assessed   Lower Extremity Assessment Lower Extremity Assessment: Overall WFL for tasks assessed       Communication Communication Communication: No difficulties   Cognition Arousal/Alertness: Awake/alert Behavior During Therapy: WFL for tasks assessed/performed Overall Cognitive Status: Within Functional Limits for tasks assessed                                 General Comments: Pleasant and cooperative     General Comments       Exercises     Shoulder Instructions      Home Living Family/patient expects to be discharged to:: Private residence Living Arrangements: Spouse/significant other;Children Available Help at Discharge: Family;Available 24 hours/day Type of Home: House Home Access: Level entry     Home Layout: One level     Bathroom Shower/Tub: Producer, television/film/video: Standard Bathroom Accessibility: Yes How Accessible: Accessible via walker Home Equipment: Rollator (4 wheels);Shower seat;BSC/3in1          Prior Functioning/Environment Prior Level of Function : Independent/Modified Independent             Mobility Comments: Rollator at home ADLs Comments: MIN A from wife for lower body dressing. Reports MOD I for other ADLs        OT Problem List: Decreased activity tolerance;Impaired vision/perception      OT Treatment/Interventions: Self-care/ADL training;Therapeutic exercise;DME and/or AE instruction;Energy conservation;Balance training    OT Goals(Current goals can be found in the care plan section) Acute Rehab OT Goals Patient Stated Goal: To feel better OT Goal Formulation: With patient Time For Goal Achievement: 08/02/22 Potential to Achieve Goals: Fair ADL Goals Pt Will Perform Upper Body Dressing: Independently;sitting Pt Will Transfer to Toilet: with modified independence;ambulating;regular height toilet Pt Will Perform Toileting - Clothing Manipulation and hygiene: with modified independence;sit to/from  stand  OT Frequency: Min 2X/week    Co-evaluation              AM-PAC OT "6 Clicks" Daily Activity     Outcome Measure Help from another person eating meals?: A Little Help from another person taking care of personal grooming?: A Little Help from another person toileting, which includes using toliet, bedpan, or urinal?: A Little Help from another person bathing (including washing, rinsing, drying)?: A Little Help from another person to put on and taking off regular upper body clothing?: A Little Help from another person to put on and taking off regular lower body clothing?: A Little 6 Click Score: 18   End of Session Equipment Utilized During Treatment: Rolling walker (2 wheels);Oxygen Nurse Communication: Mobility status  Activity Tolerance: Patient tolerated treatment well Patient left: in chair;with call bell/phone within reach;with chair alarm set  OT Visit Diagnosis: Unsteadiness on feet (R26.81);History of falling (Z91.81)                Time: 1610-9604 OT Time Calculation (min): 13 min Charges:  OT General Charges $OT Visit: 1 Visit OT Evaluation $OT Eval Low Complexity: 1 Low [address], OTS

## 2022-07-19 NOTE — Progress Notes (Signed)
SATURATION QUALIFICATIONS: (This note is used to comply with regulatory documentation for home oxygen)  Patient Saturations on Room Air at Rest =94%  Patient Saturations on Room Air while Ambulating =95%   

## 2022-07-19 NOTE — TOC Transition Note (Addendum)
Transition of Care [facility]) - CM/SW Discharge Note   Patient Details  Name: Chase Mendez MRN: [id number] Date of Birth: 12/08/1934  Transition of [facility]) CM/SW Contact:  Allena Katz, LCSW Phone Number: 07/19/2022, 12:22 PM   Clinical Narrative:     CSW spoke with patient regarding care at home and [facility]. Pt reports he is interested in getting this set up wants [place] to coordinate with his wife and daughter. CSW spoke with both wife and daughter. Daughter just left the hospital per wife and has [facility] sitting with the daughter for care. Wife is interested in using them for services for her husband if he is okay with it. CSW spoke with patient and he is agreeable to this. Pt reports he is active with PCP and has a Rollator at home.   CSW will work on finding an agency to accept for [facility]. Pt reports no care needs.    [facility] accepted for PT/OT. CSW spoke with Adelina Mings.   CSW spoke with wife and she is now declining aide services but does want HH.   Final next level of care: Home w Home Health Services Barriers to Discharge: Barriers Resolved   Patient Goals and CMS Choice CMS Medicare.gov Compare Post Acute Care list provided to:: Patient Choice offered to / list presented to : Patient  Discharge Placement                    Name of family member notified: wife Patient and family notified of of transfer: 07/19/22  Discharge Plan and Services Additional resources added to the After Visit Summary for                                       Social Determinants of Health (SDOH) Interventions SDOH Screenings   Food Insecurity: No Food Insecurity (07/17/2022)  Housing: Low Risk  (07/17/2022)  Transportation Needs: No Transportation Needs (07/17/2022)  Utilities: Not At Risk (07/17/2022)  Depression (PHQ2-9): Low Risk  (05/31/2022)  Recent Concern: Depression (PHQ2-9) - High Risk (05/29/2022)  Tobacco Use: High Risk (07/18/2022)     Readmission Risk  Interventions     No data to display

## 2022-07-21 ENCOUNTER — Telehealth: Payer: Self-pay

## 2022-07-21 DIAGNOSIS — I10 Essential (primary) hypertension: Secondary | ICD-10-CM | POA: Diagnosis not present

## 2022-07-21 DIAGNOSIS — G8929 Other chronic pain: Secondary | ICD-10-CM | POA: Diagnosis not present

## 2022-07-21 DIAGNOSIS — J432 Centrilobular emphysema: Secondary | ICD-10-CM | POA: Diagnosis not present

## 2022-07-21 DIAGNOSIS — I252 Old myocardial infarction: Secondary | ICD-10-CM | POA: Diagnosis not present

## 2022-07-21 DIAGNOSIS — N179 Acute kidney failure, unspecified: Secondary | ICD-10-CM | POA: Diagnosis not present

## 2022-07-21 DIAGNOSIS — G4700 Insomnia, unspecified: Secondary | ICD-10-CM | POA: Diagnosis not present

## 2022-07-21 DIAGNOSIS — J189 Pneumonia, unspecified organism: Secondary | ICD-10-CM | POA: Diagnosis not present

## 2022-07-21 DIAGNOSIS — M21372 Foot drop, left foot: Secondary | ICD-10-CM | POA: Diagnosis not present

## 2022-07-21 DIAGNOSIS — K449 Diaphragmatic hernia without obstruction or gangrene: Secondary | ICD-10-CM | POA: Diagnosis not present

## 2022-07-21 DIAGNOSIS — E039 Hypothyroidism, unspecified: Secondary | ICD-10-CM | POA: Diagnosis not present

## 2022-07-21 DIAGNOSIS — F32A Depression, unspecified: Secondary | ICD-10-CM | POA: Diagnosis not present

## 2022-07-21 DIAGNOSIS — M779 Enthesopathy, unspecified: Secondary | ICD-10-CM | POA: Diagnosis not present

## 2022-07-21 DIAGNOSIS — R1310 Dysphagia, unspecified: Secondary | ICD-10-CM | POA: Diagnosis not present

## 2022-07-21 DIAGNOSIS — F419 Anxiety disorder, unspecified: Secondary | ICD-10-CM | POA: Diagnosis not present

## 2022-07-21 DIAGNOSIS — Z7982 Long term (current) use of aspirin: Secondary | ICD-10-CM | POA: Diagnosis not present

## 2022-07-21 DIAGNOSIS — J9601 Acute respiratory failure with hypoxia: Secondary | ICD-10-CM | POA: Diagnosis not present

## 2022-07-21 DIAGNOSIS — I2510 Atherosclerotic heart disease of native coronary artery without angina pectoris: Secondary | ICD-10-CM | POA: Diagnosis not present

## 2022-07-21 DIAGNOSIS — Z5982 Transportation insecurity: Secondary | ICD-10-CM | POA: Diagnosis not present

## 2022-07-21 DIAGNOSIS — M109 Gout, unspecified: Secondary | ICD-10-CM | POA: Diagnosis not present

## 2022-07-21 DIAGNOSIS — K219 Gastro-esophageal reflux disease without esophagitis: Secondary | ICD-10-CM | POA: Diagnosis not present

## 2022-07-21 DIAGNOSIS — Z79891 Long term (current) use of opiate analgesic: Secondary | ICD-10-CM | POA: Diagnosis not present

## 2022-07-21 DIAGNOSIS — J440 Chronic obstructive pulmonary disease with acute lower respiratory infection: Secondary | ICD-10-CM | POA: Diagnosis not present

## 2022-07-21 DIAGNOSIS — E119 Type 2 diabetes mellitus without complications: Secondary | ICD-10-CM | POA: Diagnosis not present

## 2022-07-21 DIAGNOSIS — G9341 Metabolic encephalopathy: Secondary | ICD-10-CM | POA: Diagnosis not present

## 2022-07-21 DIAGNOSIS — M549 Dorsalgia, unspecified: Secondary | ICD-10-CM | POA: Diagnosis not present

## 2022-07-21 LAB — CULTURE, BLOOD (ROUTINE X 2): Special Requests: ADEQUATE

## 2022-07-21 NOTE — Telephone Encounter (Signed)
PT called asking for verbal PT order 1W x 8W and OT eval please advise

## 2022-07-22 LAB — CULTURE, BLOOD (ROUTINE X 2): Culture: NO GROWTH

## 2022-07-24 ENCOUNTER — Ambulatory Visit: Payer: Self-pay

## 2022-07-25 ENCOUNTER — Telehealth: Payer: Self-pay | Admitting: Internal Medicine

## 2022-07-25 DIAGNOSIS — J9601 Acute respiratory failure with hypoxia: Secondary | ICD-10-CM | POA: Diagnosis not present

## 2022-07-25 DIAGNOSIS — J440 Chronic obstructive pulmonary disease with acute lower respiratory infection: Secondary | ICD-10-CM | POA: Diagnosis not present

## 2022-07-25 DIAGNOSIS — E039 Hypothyroidism, unspecified: Secondary | ICD-10-CM | POA: Diagnosis not present

## 2022-07-25 DIAGNOSIS — G9341 Metabolic encephalopathy: Secondary | ICD-10-CM | POA: Diagnosis not present

## 2022-07-25 DIAGNOSIS — Z7982 Long term (current) use of aspirin: Secondary | ICD-10-CM | POA: Diagnosis not present

## 2022-07-25 DIAGNOSIS — M109 Gout, unspecified: Secondary | ICD-10-CM | POA: Diagnosis not present

## 2022-07-25 DIAGNOSIS — N179 Acute kidney failure, unspecified: Secondary | ICD-10-CM | POA: Diagnosis not present

## 2022-07-25 DIAGNOSIS — F419 Anxiety disorder, unspecified: Secondary | ICD-10-CM | POA: Diagnosis not present

## 2022-07-25 DIAGNOSIS — Z5982 Transportation insecurity: Secondary | ICD-10-CM | POA: Diagnosis not present

## 2022-07-25 DIAGNOSIS — K449 Diaphragmatic hernia without obstruction or gangrene: Secondary | ICD-10-CM | POA: Diagnosis not present

## 2022-07-25 DIAGNOSIS — J432 Centrilobular emphysema: Secondary | ICD-10-CM | POA: Diagnosis not present

## 2022-07-25 DIAGNOSIS — G8929 Other chronic pain: Secondary | ICD-10-CM | POA: Diagnosis not present

## 2022-07-25 DIAGNOSIS — Z79891 Long term (current) use of opiate analgesic: Secondary | ICD-10-CM | POA: Diagnosis not present

## 2022-07-25 DIAGNOSIS — M549 Dorsalgia, unspecified: Secondary | ICD-10-CM | POA: Diagnosis not present

## 2022-07-25 DIAGNOSIS — G4700 Insomnia, unspecified: Secondary | ICD-10-CM | POA: Diagnosis not present

## 2022-07-25 DIAGNOSIS — K219 Gastro-esophageal reflux disease without esophagitis: Secondary | ICD-10-CM | POA: Diagnosis not present

## 2022-07-25 DIAGNOSIS — M779 Enthesopathy, unspecified: Secondary | ICD-10-CM | POA: Diagnosis not present

## 2022-07-25 DIAGNOSIS — F32A Depression, unspecified: Secondary | ICD-10-CM | POA: Diagnosis not present

## 2022-07-25 DIAGNOSIS — I10 Essential (primary) hypertension: Secondary | ICD-10-CM | POA: Diagnosis not present

## 2022-07-25 DIAGNOSIS — E119 Type 2 diabetes mellitus without complications: Secondary | ICD-10-CM | POA: Diagnosis not present

## 2022-07-25 DIAGNOSIS — I2510 Atherosclerotic heart disease of native coronary artery without angina pectoris: Secondary | ICD-10-CM | POA: Diagnosis not present

## 2022-07-25 DIAGNOSIS — I252 Old myocardial infarction: Secondary | ICD-10-CM | POA: Diagnosis not present

## 2022-07-25 DIAGNOSIS — R1310 Dysphagia, unspecified: Secondary | ICD-10-CM | POA: Diagnosis not present

## 2022-07-25 DIAGNOSIS — M21372 Foot drop, left foot: Secondary | ICD-10-CM | POA: Diagnosis not present

## 2022-07-25 DIAGNOSIS — J189 Pneumonia, unspecified organism: Secondary | ICD-10-CM | POA: Diagnosis not present

## 2022-07-25 NOTE — Telephone Encounter (Signed)
Stephanie with [facility] called needing verbal orders for OT for once a week for 6 weeks.  Callback # [phone]

## 2022-07-26 NOTE — Telephone Encounter (Signed)
PT informed

## 2022-07-28 ENCOUNTER — Ambulatory Visit: Payer: Medicare HMO | Admitting: Internal Medicine

## 2022-07-31 ENCOUNTER — Ambulatory Visit (INDEPENDENT_AMBULATORY_CARE_PROVIDER_SITE_OTHER): Payer: Medicare HMO | Admitting: Internal Medicine

## 2022-07-31 ENCOUNTER — Other Ambulatory Visit: Payer: Self-pay | Admitting: Internal Medicine

## 2022-07-31 VITALS — BP 70/40 | HR 62 | Ht 72.0 in | Wt 187.0 lb

## 2022-07-31 DIAGNOSIS — I10 Essential (primary) hypertension: Secondary | ICD-10-CM | POA: Diagnosis not present

## 2022-07-31 DIAGNOSIS — E861 Hypovolemia: Secondary | ICD-10-CM | POA: Diagnosis not present

## 2022-07-31 DIAGNOSIS — E039 Hypothyroidism, unspecified: Secondary | ICD-10-CM

## 2022-07-31 DIAGNOSIS — K2100 Gastro-esophageal reflux disease with esophagitis, without bleeding: Secondary | ICD-10-CM

## 2022-07-31 MED ORDER — SODIUM CHLORIDE 0.9 % IV SOLN
INTRAVENOUS | Status: AC
Start: 2022-07-31 — End: 2022-07-31

## 2022-07-31 NOTE — Progress Notes (Signed)
Established Patient Office Visit  Subjective:  Patient ID: Chase Mendez, male    DOB: 09/13/1934  Age: 88 y.o. MRN: [id number]  Chief Complaint  Patient presents with   Follow-up    Hospital F/U    Hospital f/u for pneumonia. Three falls since discharge home and EMT'Tristyn Pharris had to be called on 07/28/22 but he declined hospital eval. BP low today and admits to poor fluid intake.   No other concerns at this time.   Past Medical History:  Diagnosis Date   Acute encephalopathy 12/08/2014   Anxiety    ARF (acute renal failure) (HCC) 12/08/2014   Back pain    Benign neoplasm of large bowel    Capsulitis    fractured displaced metatarsal with capsulitis   Chronic back pain    Coronavirus infection    Depression    Diabetes mellitus without complication (HCC)    no medications currently   Dysphagia    Exostosis    painful, right hallux   Foot drop, left    wears a brace   Frequent falls 02/2018   poor balance, foot drop   GERD (gastroesophageal reflux disease)    Gout    Hypertension    Hypothyroidism    Insomnia    Low testosterone    Microscopic hematuria 2016   Myocardial infarction [facility])    patient unaware when it happened years ago.     Pneumonia 12/07/2014   Pressure ulcer 12/09/2014   Sepsis (HCC) 12/08/2014   Thyroid disease     Past Surgical History:  Procedure Laterality Date   APPENDECTOMY     BACK SURGERY  1995, 1996   x 2   CHOLECYSTECTOMY     ESOPHAGOGASTRODUODENOSCOPY (EGD) WITH PROPOFOL N/A 04/24/2017   Procedure: ESOPHAGOGASTRODUODENOSCOPY (EGD) WITH PROPOFOL;  Surgeon: Scot Jun, MD;  Location: [facility] ENDOSCOPY;  Service: Endoscopy;  Laterality: N/A;   EYE SURGERY Bilateral 1983, 1985   cataract extractions   SPINAL CORD STIMULATOR INSERTION N/A 03/13/2018   Procedure: SPINAL CORD STIMULATOR INSERTION;  Surgeon: Venetia Night, MD;  Location: [facility] ORS;  Service: Neurosurgery;  Laterality: N/A;    Social History   Socioeconomic History    Marital status: Married    Spouse name: dorothy   Number of children: 2   Years of education: Not on file   Highest education level: Some college, no degree  Occupational History   Occupation: maintenance / repair    Comment: disabled  Tobacco Use   Smoking status: Every Day    Packs/day: 0.50    Years: 70.00    Additional pack years: 0.00    Total pack years: 35.00    Types: Cigars, Cigarettes   Smokeless tobacco: Never   Tobacco comments:    unable to give cessation materials due to webex visit   Vaping Use   Vaping Use: Never used  Substance and Sexual Activity   Alcohol use: No   Drug use: Yes    Types: Oxycodone   Sexual activity: Not Currently  Other Topics Concern   Not on file  Social History Narrative   Not on file   Social Determinants of Health   Financial Resource Strain: Not on file  Food Insecurity: No Food Insecurity (07/17/2022)   Hunger Vital Sign    Worried About Running Out of Food in the Last Year: Never true    Ran Out of Food in the Last Year: Never true  Transportation Needs: No Transportation Needs (07/17/2022)  PRAPARE - Administrator, Civil Service (Medical): No    Lack of Transportation (Non-Medical): No  Physical Activity: Not on file  Stress: Not on file  Social Connections: Not on file  Intimate Partner Violence: Not At Risk (07/17/2022)   Humiliation, Afraid, Rape, and Kick questionnaire    Fear of Current or Ex-Partner: No    Emotionally Abused: No    Physically Abused: No    Sexually Abused: No    Family History  Problem Relation Age of Onset   Heart disease Mother    Diabetes Father    Kidney cancer Brother     Allergies  Allergen Reactions   Doxycycline Nausea Only   Ibuprofen Nausea Only   Sulfa Antibiotics Nausea Only    Review of Systems  Constitutional: Negative.   HENT: Negative.    Eyes: Negative.   Respiratory:  Positive for cough and sputum production.   Cardiovascular: Negative.    Gastrointestinal:  Positive for nausea.  Genitourinary: Negative.   Skin: Negative.   Neurological: Negative.   Endo/Heme/Allergies: Negative.   Psychiatric/Behavioral:  Positive for depression. The patient is nervous/anxious.        Objective:   BP (!) 70/40   Pulse 62   Ht 6' (1.829 m)   Wt 187 lb (84.8 kg)   SpO2 91%   BMI 25.36 kg/m   Vitals:   07/31/22 0949  BP: (!) 70/40  Pulse: 62  Height: 6' (1.829 m)  Weight: 187 lb (84.8 kg)  SpO2: 91%  BMI (Calculated): 25.36    Physical Exam Vitals reviewed.  Constitutional:      Appearance: Normal appearance.  HENT:     Head: Normocephalic.     Left Ear: There is no impacted cerumen.     Nose: Nose normal.     Mouth/Throat:     Mouth: Mucous membranes are dry.     Pharynx: No posterior oropharyngeal erythema.  Eyes:     Extraocular Movements: Extraocular movements intact.     Pupils: Pupils are equal, round, and reactive to light.  Cardiovascular:     Rate and Rhythm: Regular rhythm.     Chest Wall: PMI is not displaced.     Pulses: Normal pulses.     Heart sounds: Normal heart sounds. No murmur heard. Pulmonary:     Effort: Pulmonary effort is normal.     Breath sounds: Normal air entry. No rhonchi or rales.  Abdominal:     General: Abdomen is flat. Bowel sounds are normal. There is no distension.     Palpations: Abdomen is soft. There is no hepatomegaly, splenomegaly or mass.     Tenderness: There is no abdominal tenderness.  Musculoskeletal:        General: Normal range of motion.     Cervical back: Normal range of motion and neck supple.     Right lower leg: No edema.     Left lower leg: No edema.  Skin:    General: Skin is warm and dry.     Comments: Decreased skin turgor  Neurological:     General: No focal deficit present.     Mental Status: He is alert and oriented to person, place, and time.     Cranial Nerves: No cranial nerve deficit.     Motor: No weakness.  Psychiatric:        Mood and  Affect: Mood normal.        Behavior: Behavior normal.  No results found for any visits on 07/31/22.      Assessment & Plan:   As per problem list.  Problem List Items Addressed This Visit       Endocrine   Hypothyroidism   Other Visit Diagnoses     Hypotension due to hypovolemia    -  Primary   Relevant Medications   0.9 %  sodium chloride infusion (Start on 07/31/2022 11:00 AM)   Primary hypertension           Return if symptoms worsen or fail to improve.   Total time spent: 30 minutes  Luna Fuse, MD  07/31/2022   This document may have been prepared by [facility] Voice Recognition software and as such may include unintentional dictation errors.

## 2022-08-02 ENCOUNTER — Ambulatory Visit: Payer: Medicare HMO | Admitting: Psychiatry

## 2022-08-02 DIAGNOSIS — G4700 Insomnia, unspecified: Secondary | ICD-10-CM | POA: Diagnosis not present

## 2022-08-02 DIAGNOSIS — J440 Chronic obstructive pulmonary disease with acute lower respiratory infection: Secondary | ICD-10-CM | POA: Diagnosis not present

## 2022-08-02 DIAGNOSIS — Z5982 Transportation insecurity: Secondary | ICD-10-CM | POA: Diagnosis not present

## 2022-08-02 DIAGNOSIS — M21372 Foot drop, left foot: Secondary | ICD-10-CM | POA: Diagnosis not present

## 2022-08-02 DIAGNOSIS — J9601 Acute respiratory failure with hypoxia: Secondary | ICD-10-CM | POA: Diagnosis not present

## 2022-08-02 DIAGNOSIS — I10 Essential (primary) hypertension: Secondary | ICD-10-CM | POA: Diagnosis not present

## 2022-08-02 DIAGNOSIS — K449 Diaphragmatic hernia without obstruction or gangrene: Secondary | ICD-10-CM | POA: Diagnosis not present

## 2022-08-02 DIAGNOSIS — J432 Centrilobular emphysema: Secondary | ICD-10-CM | POA: Diagnosis not present

## 2022-08-02 DIAGNOSIS — M779 Enthesopathy, unspecified: Secondary | ICD-10-CM | POA: Diagnosis not present

## 2022-08-02 DIAGNOSIS — M109 Gout, unspecified: Secondary | ICD-10-CM | POA: Diagnosis not present

## 2022-08-02 DIAGNOSIS — R1310 Dysphagia, unspecified: Secondary | ICD-10-CM | POA: Diagnosis not present

## 2022-08-02 DIAGNOSIS — I2510 Atherosclerotic heart disease of native coronary artery without angina pectoris: Secondary | ICD-10-CM | POA: Diagnosis not present

## 2022-08-02 DIAGNOSIS — N179 Acute kidney failure, unspecified: Secondary | ICD-10-CM | POA: Diagnosis not present

## 2022-08-02 DIAGNOSIS — J189 Pneumonia, unspecified organism: Secondary | ICD-10-CM | POA: Diagnosis not present

## 2022-08-02 DIAGNOSIS — E119 Type 2 diabetes mellitus without complications: Secondary | ICD-10-CM | POA: Diagnosis not present

## 2022-08-02 DIAGNOSIS — M549 Dorsalgia, unspecified: Secondary | ICD-10-CM | POA: Diagnosis not present

## 2022-08-02 DIAGNOSIS — F419 Anxiety disorder, unspecified: Secondary | ICD-10-CM | POA: Diagnosis not present

## 2022-08-02 DIAGNOSIS — K219 Gastro-esophageal reflux disease without esophagitis: Secondary | ICD-10-CM | POA: Diagnosis not present

## 2022-08-02 DIAGNOSIS — G9341 Metabolic encephalopathy: Secondary | ICD-10-CM | POA: Diagnosis not present

## 2022-08-02 DIAGNOSIS — E039 Hypothyroidism, unspecified: Secondary | ICD-10-CM | POA: Diagnosis not present

## 2022-08-02 DIAGNOSIS — G8929 Other chronic pain: Secondary | ICD-10-CM | POA: Diagnosis not present

## 2022-08-02 DIAGNOSIS — I252 Old myocardial infarction: Secondary | ICD-10-CM | POA: Diagnosis not present

## 2022-08-02 DIAGNOSIS — Z7982 Long term (current) use of aspirin: Secondary | ICD-10-CM | POA: Diagnosis not present

## 2022-08-02 DIAGNOSIS — Z79891 Long term (current) use of opiate analgesic: Secondary | ICD-10-CM | POA: Diagnosis not present

## 2022-08-02 DIAGNOSIS — F32A Depression, unspecified: Secondary | ICD-10-CM | POA: Diagnosis not present

## 2022-08-03 DIAGNOSIS — Z5982 Transportation insecurity: Secondary | ICD-10-CM | POA: Diagnosis not present

## 2022-08-03 DIAGNOSIS — M549 Dorsalgia, unspecified: Secondary | ICD-10-CM | POA: Diagnosis not present

## 2022-08-03 DIAGNOSIS — J432 Centrilobular emphysema: Secondary | ICD-10-CM | POA: Diagnosis not present

## 2022-08-03 DIAGNOSIS — E039 Hypothyroidism, unspecified: Secondary | ICD-10-CM | POA: Diagnosis not present

## 2022-08-03 DIAGNOSIS — G9341 Metabolic encephalopathy: Secondary | ICD-10-CM | POA: Diagnosis not present

## 2022-08-03 DIAGNOSIS — Z7982 Long term (current) use of aspirin: Secondary | ICD-10-CM | POA: Diagnosis not present

## 2022-08-03 DIAGNOSIS — I2510 Atherosclerotic heart disease of native coronary artery without angina pectoris: Secondary | ICD-10-CM | POA: Diagnosis not present

## 2022-08-03 DIAGNOSIS — J9601 Acute respiratory failure with hypoxia: Secondary | ICD-10-CM | POA: Diagnosis not present

## 2022-08-03 DIAGNOSIS — J189 Pneumonia, unspecified organism: Secondary | ICD-10-CM | POA: Diagnosis not present

## 2022-08-03 DIAGNOSIS — M109 Gout, unspecified: Secondary | ICD-10-CM | POA: Diagnosis not present

## 2022-08-03 DIAGNOSIS — K449 Diaphragmatic hernia without obstruction or gangrene: Secondary | ICD-10-CM | POA: Diagnosis not present

## 2022-08-03 DIAGNOSIS — J440 Chronic obstructive pulmonary disease with acute lower respiratory infection: Secondary | ICD-10-CM | POA: Diagnosis not present

## 2022-08-03 DIAGNOSIS — Z79891 Long term (current) use of opiate analgesic: Secondary | ICD-10-CM | POA: Diagnosis not present

## 2022-08-03 DIAGNOSIS — M779 Enthesopathy, unspecified: Secondary | ICD-10-CM | POA: Diagnosis not present

## 2022-08-03 DIAGNOSIS — I252 Old myocardial infarction: Secondary | ICD-10-CM | POA: Diagnosis not present

## 2022-08-03 DIAGNOSIS — I10 Essential (primary) hypertension: Secondary | ICD-10-CM | POA: Diagnosis not present

## 2022-08-03 DIAGNOSIS — E119 Type 2 diabetes mellitus without complications: Secondary | ICD-10-CM | POA: Diagnosis not present

## 2022-08-03 DIAGNOSIS — G4700 Insomnia, unspecified: Secondary | ICD-10-CM | POA: Diagnosis not present

## 2022-08-03 DIAGNOSIS — M21372 Foot drop, left foot: Secondary | ICD-10-CM | POA: Diagnosis not present

## 2022-08-03 DIAGNOSIS — F419 Anxiety disorder, unspecified: Secondary | ICD-10-CM | POA: Diagnosis not present

## 2022-08-03 DIAGNOSIS — R1310 Dysphagia, unspecified: Secondary | ICD-10-CM | POA: Diagnosis not present

## 2022-08-03 DIAGNOSIS — F32A Depression, unspecified: Secondary | ICD-10-CM | POA: Diagnosis not present

## 2022-08-03 DIAGNOSIS — N179 Acute kidney failure, unspecified: Secondary | ICD-10-CM | POA: Diagnosis not present

## 2022-08-03 DIAGNOSIS — G8929 Other chronic pain: Secondary | ICD-10-CM | POA: Diagnosis not present

## 2022-08-03 DIAGNOSIS — K219 Gastro-esophageal reflux disease without esophagitis: Secondary | ICD-10-CM | POA: Diagnosis not present

## 2022-08-05 ENCOUNTER — Other Ambulatory Visit: Payer: Self-pay | Admitting: Nurse Practitioner

## 2022-08-08 ENCOUNTER — Telehealth: Payer: Self-pay | Admitting: Internal Medicine

## 2022-08-08 ENCOUNTER — Ambulatory Visit: Payer: Medicare HMO | Admitting: Internal Medicine

## 2022-08-08 NOTE — Telephone Encounter (Signed)
Someone from home health (did not leave her name) called and left a VM to inform us that patient has had several falls and has been having low BP. Just FYI.

## 2022-08-09 ENCOUNTER — Encounter: Payer: Self-pay | Admitting: Internal Medicine

## 2022-08-09 DIAGNOSIS — I10 Essential (primary) hypertension: Secondary | ICD-10-CM | POA: Diagnosis not present

## 2022-08-09 DIAGNOSIS — Z7982 Long term (current) use of aspirin: Secondary | ICD-10-CM | POA: Diagnosis not present

## 2022-08-09 DIAGNOSIS — K219 Gastro-esophageal reflux disease without esophagitis: Secondary | ICD-10-CM | POA: Diagnosis not present

## 2022-08-09 DIAGNOSIS — M21372 Foot drop, left foot: Secondary | ICD-10-CM | POA: Diagnosis not present

## 2022-08-09 DIAGNOSIS — M549 Dorsalgia, unspecified: Secondary | ICD-10-CM | POA: Diagnosis not present

## 2022-08-09 DIAGNOSIS — G8929 Other chronic pain: Secondary | ICD-10-CM | POA: Diagnosis not present

## 2022-08-09 DIAGNOSIS — F32A Depression, unspecified: Secondary | ICD-10-CM | POA: Diagnosis not present

## 2022-08-09 DIAGNOSIS — J440 Chronic obstructive pulmonary disease with acute lower respiratory infection: Secondary | ICD-10-CM | POA: Diagnosis not present

## 2022-08-09 DIAGNOSIS — R1310 Dysphagia, unspecified: Secondary | ICD-10-CM | POA: Diagnosis not present

## 2022-08-09 DIAGNOSIS — E039 Hypothyroidism, unspecified: Secondary | ICD-10-CM | POA: Diagnosis not present

## 2022-08-09 DIAGNOSIS — M779 Enthesopathy, unspecified: Secondary | ICD-10-CM | POA: Diagnosis not present

## 2022-08-09 DIAGNOSIS — J189 Pneumonia, unspecified organism: Secondary | ICD-10-CM | POA: Diagnosis not present

## 2022-08-09 DIAGNOSIS — G9341 Metabolic encephalopathy: Secondary | ICD-10-CM | POA: Diagnosis not present

## 2022-08-09 DIAGNOSIS — G4700 Insomnia, unspecified: Secondary | ICD-10-CM | POA: Diagnosis not present

## 2022-08-09 DIAGNOSIS — J432 Centrilobular emphysema: Secondary | ICD-10-CM | POA: Diagnosis not present

## 2022-08-09 DIAGNOSIS — E119 Type 2 diabetes mellitus without complications: Secondary | ICD-10-CM | POA: Diagnosis not present

## 2022-08-09 DIAGNOSIS — J9601 Acute respiratory failure with hypoxia: Secondary | ICD-10-CM | POA: Diagnosis not present

## 2022-08-09 DIAGNOSIS — N179 Acute kidney failure, unspecified: Secondary | ICD-10-CM | POA: Diagnosis not present

## 2022-08-09 DIAGNOSIS — K449 Diaphragmatic hernia without obstruction or gangrene: Secondary | ICD-10-CM | POA: Diagnosis not present

## 2022-08-09 DIAGNOSIS — F419 Anxiety disorder, unspecified: Secondary | ICD-10-CM | POA: Diagnosis not present

## 2022-08-09 DIAGNOSIS — I2510 Atherosclerotic heart disease of native coronary artery without angina pectoris: Secondary | ICD-10-CM | POA: Diagnosis not present

## 2022-08-09 DIAGNOSIS — I252 Old myocardial infarction: Secondary | ICD-10-CM | POA: Diagnosis not present

## 2022-08-09 DIAGNOSIS — Z79891 Long term (current) use of opiate analgesic: Secondary | ICD-10-CM | POA: Diagnosis not present

## 2022-08-09 DIAGNOSIS — Z5982 Transportation insecurity: Secondary | ICD-10-CM | POA: Diagnosis not present

## 2022-08-09 DIAGNOSIS — M109 Gout, unspecified: Secondary | ICD-10-CM | POA: Diagnosis not present

## 2022-08-09 NOTE — Telephone Encounter (Signed)
Spoke with patients wife and wife is scheduling an appointment for patient to be seen.

## 2022-08-11 ENCOUNTER — Other Ambulatory Visit: Payer: Self-pay | Admitting: Psychiatry

## 2022-08-11 DIAGNOSIS — R1310 Dysphagia, unspecified: Secondary | ICD-10-CM | POA: Diagnosis not present

## 2022-08-11 DIAGNOSIS — E039 Hypothyroidism, unspecified: Secondary | ICD-10-CM | POA: Diagnosis not present

## 2022-08-11 DIAGNOSIS — J189 Pneumonia, unspecified organism: Secondary | ICD-10-CM | POA: Diagnosis not present

## 2022-08-11 DIAGNOSIS — M779 Enthesopathy, unspecified: Secondary | ICD-10-CM | POA: Diagnosis not present

## 2022-08-11 DIAGNOSIS — N179 Acute kidney failure, unspecified: Secondary | ICD-10-CM | POA: Diagnosis not present

## 2022-08-11 DIAGNOSIS — M549 Dorsalgia, unspecified: Secondary | ICD-10-CM | POA: Diagnosis not present

## 2022-08-11 DIAGNOSIS — G8929 Other chronic pain: Secondary | ICD-10-CM | POA: Diagnosis not present

## 2022-08-11 DIAGNOSIS — Z7982 Long term (current) use of aspirin: Secondary | ICD-10-CM | POA: Diagnosis not present

## 2022-08-11 DIAGNOSIS — Z5982 Transportation insecurity: Secondary | ICD-10-CM | POA: Diagnosis not present

## 2022-08-11 DIAGNOSIS — M21372 Foot drop, left foot: Secondary | ICD-10-CM | POA: Diagnosis not present

## 2022-08-11 DIAGNOSIS — K449 Diaphragmatic hernia without obstruction or gangrene: Secondary | ICD-10-CM | POA: Diagnosis not present

## 2022-08-11 DIAGNOSIS — Z79891 Long term (current) use of opiate analgesic: Secondary | ICD-10-CM | POA: Diagnosis not present

## 2022-08-11 DIAGNOSIS — I252 Old myocardial infarction: Secondary | ICD-10-CM | POA: Diagnosis not present

## 2022-08-11 DIAGNOSIS — I10 Essential (primary) hypertension: Secondary | ICD-10-CM | POA: Diagnosis not present

## 2022-08-11 DIAGNOSIS — I2510 Atherosclerotic heart disease of native coronary artery without angina pectoris: Secondary | ICD-10-CM | POA: Diagnosis not present

## 2022-08-11 DIAGNOSIS — J9601 Acute respiratory failure with hypoxia: Secondary | ICD-10-CM | POA: Diagnosis not present

## 2022-08-11 DIAGNOSIS — M109 Gout, unspecified: Secondary | ICD-10-CM | POA: Diagnosis not present

## 2022-08-11 DIAGNOSIS — J440 Chronic obstructive pulmonary disease with acute lower respiratory infection: Secondary | ICD-10-CM | POA: Diagnosis not present

## 2022-08-11 DIAGNOSIS — F419 Anxiety disorder, unspecified: Secondary | ICD-10-CM | POA: Diagnosis not present

## 2022-08-11 DIAGNOSIS — K219 Gastro-esophageal reflux disease without esophagitis: Secondary | ICD-10-CM | POA: Diagnosis not present

## 2022-08-11 DIAGNOSIS — J432 Centrilobular emphysema: Secondary | ICD-10-CM | POA: Diagnosis not present

## 2022-08-11 DIAGNOSIS — G9341 Metabolic encephalopathy: Secondary | ICD-10-CM | POA: Diagnosis not present

## 2022-08-11 DIAGNOSIS — E119 Type 2 diabetes mellitus without complications: Secondary | ICD-10-CM | POA: Diagnosis not present

## 2022-08-11 DIAGNOSIS — G4700 Insomnia, unspecified: Secondary | ICD-10-CM | POA: Diagnosis not present

## 2022-08-11 DIAGNOSIS — F32A Depression, unspecified: Secondary | ICD-10-CM | POA: Diagnosis not present

## 2022-08-14 DIAGNOSIS — J189 Pneumonia, unspecified organism: Secondary | ICD-10-CM | POA: Diagnosis not present

## 2022-08-14 DIAGNOSIS — Z5982 Transportation insecurity: Secondary | ICD-10-CM | POA: Diagnosis not present

## 2022-08-14 DIAGNOSIS — M21372 Foot drop, left foot: Secondary | ICD-10-CM | POA: Diagnosis not present

## 2022-08-14 DIAGNOSIS — I2510 Atherosclerotic heart disease of native coronary artery without angina pectoris: Secondary | ICD-10-CM | POA: Diagnosis not present

## 2022-08-14 DIAGNOSIS — G9341 Metabolic encephalopathy: Secondary | ICD-10-CM | POA: Diagnosis not present

## 2022-08-14 DIAGNOSIS — G8929 Other chronic pain: Secondary | ICD-10-CM | POA: Diagnosis not present

## 2022-08-14 DIAGNOSIS — M549 Dorsalgia, unspecified: Secondary | ICD-10-CM | POA: Diagnosis not present

## 2022-08-14 DIAGNOSIS — J440 Chronic obstructive pulmonary disease with acute lower respiratory infection: Secondary | ICD-10-CM | POA: Diagnosis not present

## 2022-08-14 DIAGNOSIS — F419 Anxiety disorder, unspecified: Secondary | ICD-10-CM | POA: Diagnosis not present

## 2022-08-14 DIAGNOSIS — J432 Centrilobular emphysema: Secondary | ICD-10-CM | POA: Diagnosis not present

## 2022-08-14 DIAGNOSIS — J9601 Acute respiratory failure with hypoxia: Secondary | ICD-10-CM | POA: Diagnosis not present

## 2022-08-14 DIAGNOSIS — I10 Essential (primary) hypertension: Secondary | ICD-10-CM | POA: Diagnosis not present

## 2022-08-14 DIAGNOSIS — M779 Enthesopathy, unspecified: Secondary | ICD-10-CM | POA: Diagnosis not present

## 2022-08-14 DIAGNOSIS — K219 Gastro-esophageal reflux disease without esophagitis: Secondary | ICD-10-CM | POA: Diagnosis not present

## 2022-08-14 DIAGNOSIS — I252 Old myocardial infarction: Secondary | ICD-10-CM | POA: Diagnosis not present

## 2022-08-14 DIAGNOSIS — G4700 Insomnia, unspecified: Secondary | ICD-10-CM | POA: Diagnosis not present

## 2022-08-14 DIAGNOSIS — Z7982 Long term (current) use of aspirin: Secondary | ICD-10-CM | POA: Diagnosis not present

## 2022-08-14 DIAGNOSIS — K449 Diaphragmatic hernia without obstruction or gangrene: Secondary | ICD-10-CM | POA: Diagnosis not present

## 2022-08-14 DIAGNOSIS — E119 Type 2 diabetes mellitus without complications: Secondary | ICD-10-CM | POA: Diagnosis not present

## 2022-08-14 DIAGNOSIS — Z79891 Long term (current) use of opiate analgesic: Secondary | ICD-10-CM | POA: Diagnosis not present

## 2022-08-14 DIAGNOSIS — R1310 Dysphagia, unspecified: Secondary | ICD-10-CM | POA: Diagnosis not present

## 2022-08-14 DIAGNOSIS — M109 Gout, unspecified: Secondary | ICD-10-CM | POA: Diagnosis not present

## 2022-08-14 DIAGNOSIS — F32A Depression, unspecified: Secondary | ICD-10-CM | POA: Diagnosis not present

## 2022-08-14 DIAGNOSIS — E039 Hypothyroidism, unspecified: Secondary | ICD-10-CM | POA: Diagnosis not present

## 2022-08-14 DIAGNOSIS — N179 Acute kidney failure, unspecified: Secondary | ICD-10-CM | POA: Diagnosis not present

## 2022-08-15 DIAGNOSIS — K5909 Other constipation: Secondary | ICD-10-CM | POA: Diagnosis not present

## 2022-08-15 DIAGNOSIS — R110 Nausea: Secondary | ICD-10-CM | POA: Diagnosis not present

## 2022-08-15 DIAGNOSIS — K219 Gastro-esophageal reflux disease without esophagitis: Secondary | ICD-10-CM | POA: Diagnosis not present

## 2022-08-16 NOTE — Telephone Encounter (Signed)
Patients wife called to request a refill for the following medication please advise  Last visit 05/09/22 Next visit 09/07/22  sertraline (ZOLOFT) 50 MG tablet    Pharmacy [facility] - [place], [place] - [address] Phone: [phone]  Fax: [phone]

## 2022-08-17 ENCOUNTER — Telehealth: Payer: Self-pay | Admitting: Psychiatry

## 2022-08-17 NOTE — Telephone Encounter (Signed)
Patient wife called stating she had left a message on nurse line on 08/11/22 and 08/16/22. Also [facility] reached out to get a refill on his Zoloft 50 mg. He is transferring care from Dr. Vanetta Shawl to Dr. Elna Breslow but will need a refill until that appointment in August. Please advise

## 2022-08-17 NOTE — Telephone Encounter (Signed)
Please inform the patient that the medication was sent yesterday, as per their request. Let me know if they continue to experience difficulty in obtaining the medication.

## 2022-08-18 ENCOUNTER — Ambulatory Visit (INDEPENDENT_AMBULATORY_CARE_PROVIDER_SITE_OTHER): Payer: Medicare HMO | Admitting: Internal Medicine

## 2022-08-18 VITALS — BP 130/60 | HR 56 | Ht 72.0 in | Wt 186.4 lb

## 2022-08-18 DIAGNOSIS — I951 Orthostatic hypotension: Secondary | ICD-10-CM | POA: Diagnosis not present

## 2022-08-18 DIAGNOSIS — I10 Essential (primary) hypertension: Secondary | ICD-10-CM

## 2022-08-18 NOTE — Progress Notes (Signed)
Established Patient Office Visit  Subjective:  Patient ID: Chase Mendez, male    DOB: 19-Feb-1934  Age: 88 y.o. MRN: [id number]  Chief Complaint  Patient presents with   Follow-up    BP F/U    Here after two falls in the last 2 wks. Concern for orthostatic drop in BP at home and wife has held several doses of losartan. Still admits to poor fluid intake and high caffeine intake.    No other concerns at this time.   Past Medical History:  Diagnosis Date   Acute encephalopathy 12/08/2014   Anxiety    ARF (acute renal failure) (HCC) 12/08/2014   Back pain    Benign neoplasm of large bowel    Capsulitis    fractured displaced metatarsal with capsulitis   Chronic back pain    Coronavirus infection    Depression    Diabetes mellitus without complication (HCC)    no medications currently   Dysphagia    Exostosis    painful, right hallux   Foot drop, left    wears a brace   Frequent falls 02/2018   poor balance, foot drop   GERD (gastroesophageal reflux disease)    Gout    Hypertension    Hypothyroidism    Insomnia    Low testosterone    Microscopic hematuria 2016   Myocardial infarction [facility])    patient unaware when it happened years ago.     Pneumonia 12/07/2014   Pressure ulcer 12/09/2014   Sepsis (HCC) 12/08/2014   Thyroid disease     Past Surgical History:  Procedure Laterality Date   APPENDECTOMY     BACK SURGERY  1995, 1996   x 2   CHOLECYSTECTOMY     ESOPHAGOGASTRODUODENOSCOPY (EGD) WITH PROPOFOL N/A 04/24/2017   Procedure: ESOPHAGOGASTRODUODENOSCOPY (EGD) WITH PROPOFOL;  Surgeon: Scot Jun, MD;  Location: [facility] ENDOSCOPY;  Service: Endoscopy;  Laterality: N/A;   EYE SURGERY Bilateral 1983, 1985   cataract extractions   SPINAL CORD STIMULATOR INSERTION N/A 03/13/2018   Procedure: SPINAL CORD STIMULATOR INSERTION;  Surgeon: Venetia Night, MD;  Location: [facility] ORS;  Service: Neurosurgery;  Laterality: N/A;    Social History   Socioeconomic  History   Marital status: Married    Spouse name: dorothy   Number of children: 2   Years of education: Not on file   Highest education level: Some college, no degree  Occupational History   Occupation: maintenance / repair    Comment: disabled  Tobacco Use   Smoking status: Every Day    Current packs/day: 0.50    Average packs/day: 0.5 packs/day for 70.0 years (35.0 ttl pk-yrs)    Types: Cigars, Cigarettes   Smokeless tobacco: Never   Tobacco comments:    unable to give cessation materials due to webex visit   Vaping Use   Vaping status: Never Used  Substance and Sexual Activity   Alcohol use: No   Drug use: Yes    Types: Oxycodone   Sexual activity: Not Currently  Other Topics Concern   Not on file  Social History Narrative   Not on file   Social Determinants of Health   Financial Resource Strain: Not on file  Food Insecurity: No Food Insecurity (07/17/2022)   Hunger Vital Sign    Worried About Running Out of Food in the Last Year: Never true    Ran Out of Food in the Last Year: Never true  Transportation Needs: No Transportation Needs (07/17/2022)  PRAPARE - Administrator, Civil Service (Medical): No    Lack of Transportation (Non-Medical): No  Physical Activity: Not on file  Stress: Not on file  Social Connections: Not on file  Intimate Partner Violence: Not At Risk (07/17/2022)   Humiliation, Afraid, Rape, and Kick questionnaire    Fear of Current or Ex-Partner: No    Emotionally Abused: No    Physically Abused: No    Sexually Abused: No    Family History  Problem Relation Age of Onset   Heart disease Mother    Diabetes Father    Kidney cancer Brother     Allergies  Allergen Reactions   Doxycycline Nausea Only   Ibuprofen Nausea Only   Sulfa Antibiotics Nausea Only    Review of Systems  Constitutional: Negative.   HENT: Negative.    Eyes: Negative.   Respiratory:  Positive for cough. Negative for sputum production.   Cardiovascular:  Negative.   Gastrointestinal:  Positive for nausea.  Genitourinary: Negative.   Skin: Negative.   Neurological: Negative.   Endo/Heme/Allergies: Negative.   Psychiatric/Behavioral:  Positive for depression. The patient is nervous/anxious.        Objective:   BP 130/60   Pulse (!) 56   Ht 6' (1.829 m)   Wt 186 lb 6.4 oz (84.6 kg)   SpO2 99%   BMI 25.28 kg/m   Vitals:   08/18/22 1452  BP: 130/60  Pulse: (!) 56  Height: 6' (1.829 m)  Weight: 186 lb 6.4 oz (84.6 kg)  SpO2: 99%  BMI (Calculated): 25.27    Physical Exam Vitals reviewed.  Constitutional:      Appearance: Normal appearance.  HENT:     Head: Normocephalic.     Left Ear: There is no impacted cerumen.     Nose: Nose normal.     Mouth/Throat:     Mouth: Mucous membranes are dry.     Pharynx: No posterior oropharyngeal erythema.  Eyes:     Extraocular Movements: Extraocular movements intact.     Pupils: Pupils are equal, round, and reactive to light.  Cardiovascular:     Rate and Rhythm: Regular rhythm.     Chest Wall: PMI is not displaced.     Pulses: Normal pulses.     Heart sounds: Normal heart sounds. No murmur heard. Pulmonary:     Effort: Pulmonary effort is normal.     Breath sounds: Normal air entry. No rhonchi or rales.  Abdominal:     General: Abdomen is flat. Bowel sounds are normal. There is no distension.     Palpations: Abdomen is soft. There is no hepatomegaly, splenomegaly or mass.     Tenderness: There is no abdominal tenderness.  Musculoskeletal:        General: Normal range of motion.     Cervical back: Normal range of motion and neck supple.     Right lower leg: No edema.     Left lower leg: No edema.  Skin:    General: Skin is warm and dry.     Comments: Decreased skin turgor  Neurological:     General: No focal deficit present.     Mental Status: He is alert and oriented to person, place, and time.     Cranial Nerves: No cranial nerve deficit.     Motor: No weakness.   Psychiatric:        Mood and Affect: Mood normal.        Behavior: Behavior  normal.     No results found for any visits on 08/18/22.      Assessment & Plan:   As per problem list dc amlodipine. Wear compression stockings during the day and increase daily water intake. Problem List Items Addressed This Visit   None Visit Diagnoses     Primary hypertension    -  Primary   Orthostatic hypotension       Relevant Orders   Compression stockings       Return if symptoms worsen or fail to improve.   Total time spent: 30 minutes  Luna Fuse, MD  08/18/2022   This document may have been prepared by [facility] Voice Recognition software and as such may include unintentional dictation errors.

## 2022-08-21 DIAGNOSIS — M549 Dorsalgia, unspecified: Secondary | ICD-10-CM | POA: Diagnosis not present

## 2022-08-21 DIAGNOSIS — I252 Old myocardial infarction: Secondary | ICD-10-CM | POA: Diagnosis not present

## 2022-08-21 DIAGNOSIS — E039 Hypothyroidism, unspecified: Secondary | ICD-10-CM | POA: Diagnosis not present

## 2022-08-21 DIAGNOSIS — I10 Essential (primary) hypertension: Secondary | ICD-10-CM | POA: Diagnosis not present

## 2022-08-21 DIAGNOSIS — J440 Chronic obstructive pulmonary disease with acute lower respiratory infection: Secondary | ICD-10-CM | POA: Diagnosis not present

## 2022-08-21 DIAGNOSIS — G8929 Other chronic pain: Secondary | ICD-10-CM | POA: Diagnosis not present

## 2022-08-21 DIAGNOSIS — J189 Pneumonia, unspecified organism: Secondary | ICD-10-CM | POA: Diagnosis not present

## 2022-08-21 DIAGNOSIS — Z79891 Long term (current) use of opiate analgesic: Secondary | ICD-10-CM | POA: Diagnosis not present

## 2022-08-21 DIAGNOSIS — M779 Enthesopathy, unspecified: Secondary | ICD-10-CM | POA: Diagnosis not present

## 2022-08-21 DIAGNOSIS — J432 Centrilobular emphysema: Secondary | ICD-10-CM | POA: Diagnosis not present

## 2022-08-21 DIAGNOSIS — G9341 Metabolic encephalopathy: Secondary | ICD-10-CM | POA: Diagnosis not present

## 2022-08-21 DIAGNOSIS — I2510 Atherosclerotic heart disease of native coronary artery without angina pectoris: Secondary | ICD-10-CM | POA: Diagnosis not present

## 2022-08-21 DIAGNOSIS — N179 Acute kidney failure, unspecified: Secondary | ICD-10-CM | POA: Diagnosis not present

## 2022-08-21 DIAGNOSIS — G4700 Insomnia, unspecified: Secondary | ICD-10-CM | POA: Diagnosis not present

## 2022-08-21 DIAGNOSIS — Z5982 Transportation insecurity: Secondary | ICD-10-CM | POA: Diagnosis not present

## 2022-08-21 DIAGNOSIS — M21372 Foot drop, left foot: Secondary | ICD-10-CM | POA: Diagnosis not present

## 2022-08-21 DIAGNOSIS — E119 Type 2 diabetes mellitus without complications: Secondary | ICD-10-CM | POA: Diagnosis not present

## 2022-08-21 DIAGNOSIS — M109 Gout, unspecified: Secondary | ICD-10-CM | POA: Diagnosis not present

## 2022-08-21 DIAGNOSIS — J9601 Acute respiratory failure with hypoxia: Secondary | ICD-10-CM | POA: Diagnosis not present

## 2022-08-21 DIAGNOSIS — Z7982 Long term (current) use of aspirin: Secondary | ICD-10-CM | POA: Diagnosis not present

## 2022-08-21 DIAGNOSIS — K449 Diaphragmatic hernia without obstruction or gangrene: Secondary | ICD-10-CM | POA: Diagnosis not present

## 2022-08-21 DIAGNOSIS — K219 Gastro-esophageal reflux disease without esophagitis: Secondary | ICD-10-CM | POA: Diagnosis not present

## 2022-08-21 DIAGNOSIS — F419 Anxiety disorder, unspecified: Secondary | ICD-10-CM | POA: Diagnosis not present

## 2022-08-21 DIAGNOSIS — F32A Depression, unspecified: Secondary | ICD-10-CM | POA: Diagnosis not present

## 2022-08-21 DIAGNOSIS — R1310 Dysphagia, unspecified: Secondary | ICD-10-CM | POA: Diagnosis not present

## 2022-08-23 DIAGNOSIS — M109 Gout, unspecified: Secondary | ICD-10-CM | POA: Diagnosis not present

## 2022-08-23 DIAGNOSIS — J432 Centrilobular emphysema: Secondary | ICD-10-CM | POA: Diagnosis not present

## 2022-08-23 DIAGNOSIS — J9601 Acute respiratory failure with hypoxia: Secondary | ICD-10-CM | POA: Diagnosis not present

## 2022-08-23 DIAGNOSIS — J440 Chronic obstructive pulmonary disease with acute lower respiratory infection: Secondary | ICD-10-CM | POA: Diagnosis not present

## 2022-08-23 DIAGNOSIS — N179 Acute kidney failure, unspecified: Secondary | ICD-10-CM | POA: Diagnosis not present

## 2022-08-23 DIAGNOSIS — G8929 Other chronic pain: Secondary | ICD-10-CM | POA: Diagnosis not present

## 2022-08-23 DIAGNOSIS — K449 Diaphragmatic hernia without obstruction or gangrene: Secondary | ICD-10-CM | POA: Diagnosis not present

## 2022-08-23 DIAGNOSIS — F419 Anxiety disorder, unspecified: Secondary | ICD-10-CM | POA: Diagnosis not present

## 2022-08-23 DIAGNOSIS — Z7982 Long term (current) use of aspirin: Secondary | ICD-10-CM | POA: Diagnosis not present

## 2022-08-23 DIAGNOSIS — Z79891 Long term (current) use of opiate analgesic: Secondary | ICD-10-CM | POA: Diagnosis not present

## 2022-08-23 DIAGNOSIS — I252 Old myocardial infarction: Secondary | ICD-10-CM | POA: Diagnosis not present

## 2022-08-23 DIAGNOSIS — M21372 Foot drop, left foot: Secondary | ICD-10-CM | POA: Diagnosis not present

## 2022-08-23 DIAGNOSIS — I2510 Atherosclerotic heart disease of native coronary artery without angina pectoris: Secondary | ICD-10-CM | POA: Diagnosis not present

## 2022-08-23 DIAGNOSIS — F32A Depression, unspecified: Secondary | ICD-10-CM | POA: Diagnosis not present

## 2022-08-23 DIAGNOSIS — I10 Essential (primary) hypertension: Secondary | ICD-10-CM | POA: Diagnosis not present

## 2022-08-23 DIAGNOSIS — K219 Gastro-esophageal reflux disease without esophagitis: Secondary | ICD-10-CM | POA: Diagnosis not present

## 2022-08-23 DIAGNOSIS — Z5982 Transportation insecurity: Secondary | ICD-10-CM | POA: Diagnosis not present

## 2022-08-23 DIAGNOSIS — E119 Type 2 diabetes mellitus without complications: Secondary | ICD-10-CM | POA: Diagnosis not present

## 2022-08-23 DIAGNOSIS — G4700 Insomnia, unspecified: Secondary | ICD-10-CM | POA: Diagnosis not present

## 2022-08-23 DIAGNOSIS — J189 Pneumonia, unspecified organism: Secondary | ICD-10-CM | POA: Diagnosis not present

## 2022-08-23 DIAGNOSIS — E039 Hypothyroidism, unspecified: Secondary | ICD-10-CM | POA: Diagnosis not present

## 2022-08-23 DIAGNOSIS — R1310 Dysphagia, unspecified: Secondary | ICD-10-CM | POA: Diagnosis not present

## 2022-08-23 DIAGNOSIS — M779 Enthesopathy, unspecified: Secondary | ICD-10-CM | POA: Diagnosis not present

## 2022-08-23 DIAGNOSIS — M549 Dorsalgia, unspecified: Secondary | ICD-10-CM | POA: Diagnosis not present

## 2022-08-23 DIAGNOSIS — G9341 Metabolic encephalopathy: Secondary | ICD-10-CM | POA: Diagnosis not present

## 2022-08-28 ENCOUNTER — Encounter: Payer: Medicare HMO | Admitting: Pain Medicine

## 2022-08-29 DIAGNOSIS — E039 Hypothyroidism, unspecified: Secondary | ICD-10-CM | POA: Diagnosis not present

## 2022-08-29 DIAGNOSIS — F32A Depression, unspecified: Secondary | ICD-10-CM | POA: Diagnosis not present

## 2022-08-29 DIAGNOSIS — I252 Old myocardial infarction: Secondary | ICD-10-CM | POA: Diagnosis not present

## 2022-08-29 DIAGNOSIS — R1310 Dysphagia, unspecified: Secondary | ICD-10-CM | POA: Diagnosis not present

## 2022-08-29 DIAGNOSIS — M21372 Foot drop, left foot: Secondary | ICD-10-CM | POA: Diagnosis not present

## 2022-08-29 DIAGNOSIS — M549 Dorsalgia, unspecified: Secondary | ICD-10-CM | POA: Diagnosis not present

## 2022-08-29 DIAGNOSIS — G4700 Insomnia, unspecified: Secondary | ICD-10-CM | POA: Diagnosis not present

## 2022-08-29 DIAGNOSIS — K449 Diaphragmatic hernia without obstruction or gangrene: Secondary | ICD-10-CM | POA: Diagnosis not present

## 2022-08-29 DIAGNOSIS — G8929 Other chronic pain: Secondary | ICD-10-CM | POA: Diagnosis not present

## 2022-08-29 DIAGNOSIS — J440 Chronic obstructive pulmonary disease with acute lower respiratory infection: Secondary | ICD-10-CM | POA: Diagnosis not present

## 2022-08-29 DIAGNOSIS — J189 Pneumonia, unspecified organism: Secondary | ICD-10-CM | POA: Diagnosis not present

## 2022-08-29 DIAGNOSIS — Z79891 Long term (current) use of opiate analgesic: Secondary | ICD-10-CM | POA: Diagnosis not present

## 2022-08-29 DIAGNOSIS — I10 Essential (primary) hypertension: Secondary | ICD-10-CM | POA: Diagnosis not present

## 2022-08-29 DIAGNOSIS — F419 Anxiety disorder, unspecified: Secondary | ICD-10-CM | POA: Diagnosis not present

## 2022-08-29 DIAGNOSIS — K219 Gastro-esophageal reflux disease without esophagitis: Secondary | ICD-10-CM | POA: Diagnosis not present

## 2022-08-29 DIAGNOSIS — I2510 Atherosclerotic heart disease of native coronary artery without angina pectoris: Secondary | ICD-10-CM | POA: Diagnosis not present

## 2022-08-29 DIAGNOSIS — J9601 Acute respiratory failure with hypoxia: Secondary | ICD-10-CM | POA: Diagnosis not present

## 2022-08-29 DIAGNOSIS — G9341 Metabolic encephalopathy: Secondary | ICD-10-CM | POA: Diagnosis not present

## 2022-08-29 DIAGNOSIS — N179 Acute kidney failure, unspecified: Secondary | ICD-10-CM | POA: Diagnosis not present

## 2022-08-29 DIAGNOSIS — M109 Gout, unspecified: Secondary | ICD-10-CM | POA: Diagnosis not present

## 2022-08-29 DIAGNOSIS — Z7982 Long term (current) use of aspirin: Secondary | ICD-10-CM | POA: Diagnosis not present

## 2022-08-29 DIAGNOSIS — J432 Centrilobular emphysema: Secondary | ICD-10-CM | POA: Diagnosis not present

## 2022-08-29 DIAGNOSIS — Z5982 Transportation insecurity: Secondary | ICD-10-CM | POA: Diagnosis not present

## 2022-08-29 DIAGNOSIS — M779 Enthesopathy, unspecified: Secondary | ICD-10-CM | POA: Diagnosis not present

## 2022-08-29 DIAGNOSIS — E119 Type 2 diabetes mellitus without complications: Secondary | ICD-10-CM | POA: Diagnosis not present

## 2022-08-30 DIAGNOSIS — J189 Pneumonia, unspecified organism: Secondary | ICD-10-CM | POA: Diagnosis not present

## 2022-08-30 DIAGNOSIS — E119 Type 2 diabetes mellitus without complications: Secondary | ICD-10-CM | POA: Diagnosis not present

## 2022-08-30 DIAGNOSIS — I10 Essential (primary) hypertension: Secondary | ICD-10-CM | POA: Diagnosis not present

## 2022-08-30 DIAGNOSIS — J9601 Acute respiratory failure with hypoxia: Secondary | ICD-10-CM | POA: Diagnosis not present

## 2022-08-30 DIAGNOSIS — I252 Old myocardial infarction: Secondary | ICD-10-CM | POA: Diagnosis not present

## 2022-08-30 DIAGNOSIS — Z79891 Long term (current) use of opiate analgesic: Secondary | ICD-10-CM | POA: Diagnosis not present

## 2022-08-30 DIAGNOSIS — J432 Centrilobular emphysema: Secondary | ICD-10-CM | POA: Diagnosis not present

## 2022-08-30 DIAGNOSIS — R1310 Dysphagia, unspecified: Secondary | ICD-10-CM | POA: Diagnosis not present

## 2022-08-30 DIAGNOSIS — I2510 Atherosclerotic heart disease of native coronary artery without angina pectoris: Secondary | ICD-10-CM | POA: Diagnosis not present

## 2022-08-30 DIAGNOSIS — F32A Depression, unspecified: Secondary | ICD-10-CM | POA: Diagnosis not present

## 2022-08-30 DIAGNOSIS — Z7982 Long term (current) use of aspirin: Secondary | ICD-10-CM | POA: Diagnosis not present

## 2022-08-30 DIAGNOSIS — J440 Chronic obstructive pulmonary disease with acute lower respiratory infection: Secondary | ICD-10-CM | POA: Diagnosis not present

## 2022-08-30 DIAGNOSIS — K449 Diaphragmatic hernia without obstruction or gangrene: Secondary | ICD-10-CM | POA: Diagnosis not present

## 2022-08-30 DIAGNOSIS — G8929 Other chronic pain: Secondary | ICD-10-CM | POA: Diagnosis not present

## 2022-08-30 DIAGNOSIS — Z5982 Transportation insecurity: Secondary | ICD-10-CM | POA: Diagnosis not present

## 2022-08-30 DIAGNOSIS — M109 Gout, unspecified: Secondary | ICD-10-CM | POA: Diagnosis not present

## 2022-08-30 DIAGNOSIS — M549 Dorsalgia, unspecified: Secondary | ICD-10-CM | POA: Diagnosis not present

## 2022-08-30 DIAGNOSIS — M21372 Foot drop, left foot: Secondary | ICD-10-CM | POA: Diagnosis not present

## 2022-08-30 DIAGNOSIS — M779 Enthesopathy, unspecified: Secondary | ICD-10-CM | POA: Diagnosis not present

## 2022-08-30 DIAGNOSIS — G4700 Insomnia, unspecified: Secondary | ICD-10-CM | POA: Diagnosis not present

## 2022-08-30 DIAGNOSIS — K219 Gastro-esophageal reflux disease without esophagitis: Secondary | ICD-10-CM | POA: Diagnosis not present

## 2022-08-30 DIAGNOSIS — F419 Anxiety disorder, unspecified: Secondary | ICD-10-CM | POA: Diagnosis not present

## 2022-08-30 DIAGNOSIS — G9341 Metabolic encephalopathy: Secondary | ICD-10-CM | POA: Diagnosis not present

## 2022-08-30 DIAGNOSIS — N179 Acute kidney failure, unspecified: Secondary | ICD-10-CM | POA: Diagnosis not present

## 2022-08-30 DIAGNOSIS — E039 Hypothyroidism, unspecified: Secondary | ICD-10-CM | POA: Diagnosis not present

## 2022-08-31 ENCOUNTER — Other Ambulatory Visit: Payer: Medicare HMO

## 2022-08-31 DIAGNOSIS — E039 Hypothyroidism, unspecified: Secondary | ICD-10-CM | POA: Diagnosis not present

## 2022-08-31 DIAGNOSIS — I10 Essential (primary) hypertension: Secondary | ICD-10-CM | POA: Diagnosis not present

## 2022-08-31 DIAGNOSIS — E782 Mixed hyperlipidemia: Secondary | ICD-10-CM | POA: Diagnosis not present

## 2022-09-04 ENCOUNTER — Encounter: Payer: Self-pay | Admitting: Pain Medicine

## 2022-09-04 ENCOUNTER — Ambulatory Visit (INDEPENDENT_AMBULATORY_CARE_PROVIDER_SITE_OTHER): Payer: Medicare HMO | Admitting: Internal Medicine

## 2022-09-04 ENCOUNTER — Encounter: Payer: Self-pay | Admitting: Internal Medicine

## 2022-09-04 ENCOUNTER — Ambulatory Visit: Payer: PRIVATE HEALTH INSURANCE | Attending: Pain Medicine | Admitting: Pain Medicine

## 2022-09-04 VITALS — BP 130/67 | HR 59 | Temp 98.1°F | Resp 18 | Ht 72.0 in | Wt 198.0 lb

## 2022-09-04 VITALS — BP 118/58 | HR 66 | Ht 72.0 in | Wt 189.6 lb

## 2022-09-04 DIAGNOSIS — G894 Chronic pain syndrome: Secondary | ICD-10-CM | POA: Diagnosis present

## 2022-09-04 DIAGNOSIS — M5136 Other intervertebral disc degeneration, lumbar region: Secondary | ICD-10-CM | POA: Insufficient documentation

## 2022-09-04 DIAGNOSIS — M5442 Lumbago with sciatica, left side: Secondary | ICD-10-CM | POA: Diagnosis present

## 2022-09-04 DIAGNOSIS — G8929 Other chronic pain: Secondary | ICD-10-CM | POA: Diagnosis present

## 2022-09-04 DIAGNOSIS — M5417 Radiculopathy, lumbosacral region: Secondary | ICD-10-CM | POA: Diagnosis present

## 2022-09-04 DIAGNOSIS — I10 Essential (primary) hypertension: Secondary | ICD-10-CM

## 2022-09-04 DIAGNOSIS — E870 Hyperosmolality and hypernatremia: Secondary | ICD-10-CM

## 2022-09-04 DIAGNOSIS — M79605 Pain in left leg: Secondary | ICD-10-CM | POA: Insufficient documentation

## 2022-09-04 DIAGNOSIS — F331 Major depressive disorder, recurrent, moderate: Secondary | ICD-10-CM

## 2022-09-04 DIAGNOSIS — N179 Acute kidney failure, unspecified: Secondary | ICD-10-CM

## 2022-09-04 DIAGNOSIS — M1A9XX0 Chronic gout, unspecified, without tophus (tophi): Secondary | ICD-10-CM | POA: Insufficient documentation

## 2022-09-04 DIAGNOSIS — M1A40X0 Other secondary chronic gout, unspecified site, without tophus (tophi): Secondary | ICD-10-CM

## 2022-09-04 DIAGNOSIS — E039 Hypothyroidism, unspecified: Secondary | ICD-10-CM | POA: Diagnosis not present

## 2022-09-04 DIAGNOSIS — Z79891 Long term (current) use of opiate analgesic: Secondary | ICD-10-CM | POA: Insufficient documentation

## 2022-09-04 DIAGNOSIS — Z79899 Other long term (current) drug therapy: Secondary | ICD-10-CM | POA: Diagnosis present

## 2022-09-04 DIAGNOSIS — M961 Postlaminectomy syndrome, not elsewhere classified: Secondary | ICD-10-CM | POA: Insufficient documentation

## 2022-09-04 DIAGNOSIS — E782 Mixed hyperlipidemia: Secondary | ICD-10-CM | POA: Diagnosis not present

## 2022-09-04 MED ORDER — LOSARTAN POTASSIUM 100 MG PO TABS
100.0000 mg | ORAL_TABLET | Freq: Every day | ORAL | 1 refills | Status: DC
Start: 2022-09-04 — End: 2023-02-10

## 2022-09-04 MED ORDER — OXYCODONE HCL 10 MG PO TABS
10.0000 mg | ORAL_TABLET | Freq: Four times a day (QID) | ORAL | 0 refills | Status: DC | PRN
Start: 2022-09-07 — End: 2022-11-30

## 2022-09-04 MED ORDER — OXYCODONE HCL 10 MG PO TABS
10.0000 mg | ORAL_TABLET | Freq: Four times a day (QID) | ORAL | 0 refills | Status: DC | PRN
Start: 2022-11-06 — End: 2022-11-30

## 2022-09-04 MED ORDER — ALLOPURINOL 100 MG PO TABS
100.0000 mg | ORAL_TABLET | ORAL | 1 refills | Status: DC
Start: 2022-09-04 — End: 2023-01-01

## 2022-09-04 MED ORDER — OXYCODONE HCL 10 MG PO TABS
10.0000 mg | ORAL_TABLET | Freq: Four times a day (QID) | ORAL | 0 refills | Status: DC | PRN
Start: 2022-10-07 — End: 2022-11-30

## 2022-09-04 MED ORDER — LEVOTHYROXINE SODIUM 150 MCG PO TABS
150.0000 ug | ORAL_TABLET | Freq: Every day | ORAL | 1 refills | Status: DC
Start: 2022-09-04 — End: 2023-02-26

## 2022-09-04 NOTE — Patient Instructions (Signed)
____________________________________________________________________________________________  Opioid Pain Medication Update  To: All patients taking opioid pain medications. (I.e.: hydrocodone, hydromorphone, oxycodone, oxymorphone, morphine, codeine, methadone, tapentadol, tramadol, buprenorphine, fentanyl, etc.)  Re: Updated review of side effects and adverse reactions of opioid analgesics, as well as new information about long term effects of this class of medications.  Direct risks of long-term opioid therapy are not limited to opioid addiction and overdose. Potential medical risks include serious fractures, breathing problems during sleep, hyperalgesia, immunosuppression, chronic constipation, bowel obstruction, myocardial infarction, and tooth decay secondary to xerostomia.  Unpredictable adverse effects that can occur even if you take your medication correctly: Cognitive impairment, respiratory depression, and death. Most people think that if they take their medication "correctly", and "as instructed", that they will be safe. Nothing could be farther from the truth. In reality, a significant amount of recorded deaths associated with the use of opioids has occurred in individuals that had taken the medication for a long time, and were taking their medication correctly. The following are examples of how this can happen: Patient taking his/her medication for a long time, as instructed, without any side effects, is given a certain antibiotic or another unrelated medication, which in turn triggers a "Drug-to-drug interaction" leading to disorientation, cognitive impairment, impaired reflexes, respiratory depression or an untoward event leading to serious bodily harm or injury, including death.  Patient taking his/her medication for a long time, as instructed, without any side effects, develops an acute impairment of liver and/or kidney function. This will lead to a rapid inability of the body to  breakdown and eliminate their pain medication, which will result in effects similar to an "overdose", but with the same medicine and dose that they had always taken. This again may lead to disorientation, cognitive impairment, impaired reflexes, respiratory depression or an untoward event leading to serious bodily harm or injury, including death.  A similar problem will occur with patients as they grow older and their liver and kidney function begins to decrease as part of the aging process.  Background information: Historically, the original case for using long-term opioid therapy to treat chronic noncancer pain was based on safety assumptions that subsequent experience has called into question. In 1996, the American Pain Society and the American Academy of Pain Medicine issued a consensus statement supporting long-term opioid therapy. This statement acknowledged the dangers of opioid prescribing but concluded that the risk for addiction was low; respiratory depression induced by opioids was short-lived, occurred mainly in opioid-naive patients, and was antagonized by pain; tolerance was not a common problem; and efforts to control diversion should not constrain opioid prescribing. This has now proven to be wrong. Experience regarding the risks for opioid addiction, misuse, and overdose in community practice has failed to support these assumptions.  According to the Centers for Disease Control and Prevention, fatal overdoses involving opioid analgesics have increased sharply over the past decade. Currently, more than 96,700 people die from drug overdoses every year. Opioids are a factor in 7 out of every 10 overdose deaths. Deaths from drug overdose have surpassed motor vehicle accidents as the leading cause of death for individuals between the ages of 80 and 61.  Clinical data suggest that neuroendocrine dysfunction may be very common in both men and women, potentially causing hypogonadism, erectile  dysfunction, infertility, decreased libido, osteoporosis, and depression. Recent studies linked higher opioid dose to increased opioid-related mortality. Controlled observational studies reported that long-term opioid therapy may be associated with increased risk for cardiovascular events. Subsequent meta-analysis concluded  that the safety of long-term opioid therapy in elderly patients has not been proven.   Side Effects and adverse reactions: Common side effects: Drowsiness (sedation). Dizziness. Nausea and vomiting. Constipation. Physical dependence -- Dependence often manifests with withdrawal symptoms when opioids are discontinued or decreased. Tolerance -- As you take repeated doses of opioids, you require increased medication to experience the same effect of pain relief. Respiratory depression -- This can occur in healthy people, especially with higher doses. However, people with COPD, asthma or other lung conditions may be even more susceptible to fatal respiratory impairment.  Uncommon side effects: An increased sensitivity to feeling pain and extreme response to pain (hyperalgesia). Chronic use of opioids can lead to this. Delayed gastric emptying (the process by which the contents of your stomach are moved into your small intestine). Muscle rigidity. Immune system and hormonal dysfunction. Quick, involuntary muscle jerks (myoclonus). Arrhythmia. Itchy skin (pruritus). Dry mouth (xerostomia).  Long-term side effects: Chronic constipation. Sleep-disordered breathing (SDB). Increased risk of bone fractures. Hypothalamic-pituitary-adrenal dysregulation. Increased risk of overdose.  RISKS: Respiratory depression and death: Opioids increase the risk of respiratory depression and death.  Drug-to-drug interactions: Opioids are relatively contraindicated in combination with benzodiazepines, sleep inducers, and other central nervous system depressants. Other classes of medications  (i.e.: certain antibiotics and even over-the-counter medications) may also trigger or induce respiratory depression in some patients.  Medical conditions: Patients with pre-existing respiratory problems are at higher risk of respiratory failure and/or depression when in combination with opioid analgesics. Opioids are relatively contraindicated in some medical conditions such as central sleep apnea.   Fractures and Falls:  Opioids increase the risk and incidence of falls. This is of particular importance in elderly patients.  Endocrine System:  Long-term administration is associated with endocrine abnormalities (endocrinopathies). (Also known as Opioid-induced Endocrinopathy) Influences on both the hypothalamic-pituitary-adrenal axis?and the hypothalamic-pituitary-gonadal axis have been demonstrated with consequent hypogonadism and adrenal insufficiency in both sexes. Hypogonadism and decreased levels of dehydroepiandrosterone sulfate have been reported in men and women. Endocrine effects include: Amenorrhoea in women (abnormal absence of menstruation) Reduced libido in both sexes Decreased sexual function Erectile dysfunction in men Hypogonadisms (decreased testicular function with shrinkage of testicles) Infertility Depression and fatigue Loss of muscle mass Anxiety Depression Immune suppression Hyperalgesia Weight gain Anemia Osteoporosis Patients (particularly women of childbearing age) should avoid opioids. There is insufficient evidence to recommend routine monitoring of asymptomatic patients taking opioids in the long-term for hormonal deficiencies.  Immune System: Human studies have demonstrated that opioids have an immunomodulating effect. These effects are mediated via opioid receptors both on immune effector cells and in the central nervous system. Opioids have been demonstrated to have adverse effects on antimicrobial response and anti-tumour surveillance. Buprenorphine has  been demonstrated to have no impact on immune function.  Opioid Induced Hyperalgesia: Human studies have demonstrated that prolonged use of opioids can lead to a state of abnormal pain sensitivity, sometimes called opioid induced hyperalgesia (OIH). Opioid induced hyperalgesia is not usually seen in the absence of tolerance to opioid analgesia. Clinically, hyperalgesia may be diagnosed if the patient on long-term opioid therapy presents with increased pain. This might be qualitatively and anatomically distinct from pain related to disease progression or to breakthrough pain resulting from development of opioid tolerance. Pain associated with hyperalgesia tends to be more diffuse than the pre-existing pain and less defined in quality. Management of opioid induced hyperalgesia requires opioid dose reduction.  Cancer: Chronic opioid therapy has been associated with an increased risk of cancer  among noncancer patients with chronic pain. This association was more evident in chronic strong opioid users. Chronic opioid consumption causes significant pathological changes in the small intestine and colon. Epidemiological studies have found that there is a link between opium dependence and initiation of gastrointestinal cancers. Cancer is the second leading cause of death after cardiovascular disease. Chronic use of opioids can cause multiple conditions such as GERD, immunosuppression and renal damage as well as carcinogenic effects, which are associated with the incidence of cancers.   Mortality: Long-term opioid use has been associated with increased mortality among patients with chronic non-cancer pain (CNCP).  Prescription of long-acting opioids for chronic noncancer pain was associated with a significantly increased risk of all-cause mortality, including deaths from causes other than overdose.  Reference: Von Korff M, Kolodny A, Deyo RA, Chou R. Long-term opioid therapy reconsidered. Ann Intern Med. 2011  Sep 6;155(5):325-8. doi: 10.7326/[id number]. PMID: 64403474; PMCID: [id number]. Randon Goldsmith, Hayward RA, Dunn KM, [place] KP. Risk of adverse events in patients prescribed long-term opioids: A cohort study in the [place] [facility] Research Datalink. Eur J Pain. 2019 May;23(5):908-922. doi: 10.1002/ejp.1357. Epub 2019 Jan 31. PMID: 43329518. Colameco S, Coren JS, Ciervo CA. Continuous opioid treatment for chronic noncancer pain: a time for moderation in prescribing. Postgrad Med. 2009 Jul;121(4):61-6. doi: 10.3810/pgm.2009.07.2032. PMID: 84166063. William Hamburger RN, [place] SD, Blazina I, Cristopher Peru, Bougatsos C, Deyo RA. The effectiveness and risks of long-term opioid therapy for chronic pain: a systematic review for a [employer] of [employer] Pathways to [employer]. Ann Intern Med. 2015 Feb 17;162(4):276-86. doi: 10.7326/M14-2559. PMID: 01601093. Caryl Bis [facility], Makuc DM. NCHS Data Brief No. 22. [place]: Centers for Disease Control and Prevention; 2009. Sep, Increase in Fatal Poisonings Involving Opioid Analgesics in the [place], 1999-2006. Song IA, Choi HR, Oh TK. Long-term opioid use and mortality in patients with chronic non-cancer pain: Ten-year follow-up study in [place] from 2010 through 2019. EClinicalMedicine. 2022 Jul 18;51:101558. doi: 10.1016/j.eclinm.2022.235573. PMID: 22025427; PMCID: [id number]. Huser, W., Schubert, T., Vogelmann, T. et al. All-cause mortality in patients with long-term opioid therapy compared with non-opioid analgesics for chronic non-cancer pain: a database study. BMC Med 18, 162 (2020). http://lester.info/ Rashidian H, Karie Kirks, Malekzadeh R, Haghdoost AA. An Ecological Study of the Association between Opiate Use and Incidence of Cancers. Addict Health. 2016 Fall;8(4):252-260. PMID: 51761607; PMCID: [id number].  Our Goal: Our goal is to control your  pain with means other than the use of opioid pain medications.  Our Recommendation: Talk to your physician about coming off of these medications. We can assist you with the tapering down and stopping these medicines. Based on the new information, even if you cannot completely stop the medication, a decrease in the dose may be associated with a lesser risk. Ask for other means of controlling the pain. Decrease or eliminate those factors that significantly contribute to your pain such as smoking, obesity, and a diet heavily tilted towards "inflammatory" nutrients.  Last Updated: 08/14/2022   ____________________________________________________________________________________________     ____________________________________________________________________________________________  National Pain Medication Shortage  The [place] is experiencing worsening drug shortages. These have had a negative widespread effect on patient care and treatment. Not expected to improve any time soon. Predicted to last past 2029.   Drug shortage list (generic names) Oxycodone IR Oxycodone/APAP Oxymorphone IR Hydromorphone Hydrocodone/APAP Morphine  Where is the problem?  Manufacturing and supply level.  Will this shortage affect you?  Only if you  take any of the above pain medications.  How? You may be unable to fill your prescription.  Your pharmacist may offer a "partial fill" of your prescription. (Warning: Do not accept partial fills.) Prescriptions partially filled cannot be transferred to another pharmacy. Read our Medication Rules and Regulation. Depending on how much medicine you are dependent on, you may experience withdrawals when unable to get the medication.  Recommendations: Consider ending your dependence on opioid pain medications. Ask your pain specialist to assist you with the process. Consider switching to a medication currently not in shortage, such as Buprenorphine. Talk to your pain  specialist about this option. Consider decreasing your pain medication requirements by managing tolerance thru "Drug Holidays". This may help minimize withdrawals, should you run out of medicine. Control your pain thru the use of non-pharmacological interventional therapies.   Your prescriber: Prescribers cannot be blamed for shortages. Medication manufacturing and supply issues cannot be fixed by the prescriber.   NOTE: The prescriber is not responsible for supplying the medication, or solving supply issues. Work with your pharmacist to solve it. The patient is responsible for the decision to take or continue taking the medication and for identifying and securing a legal supply source. By law, supplying the medication is the job and responsibility of the pharmacy. The prescriber is responsible for the evaluation, monitoring, and prescribing of these medications.   Prescribers will NOT: Re-issue prescriptions that have been partially filled. Re-issue prescriptions already sent to a pharmacy.  Re-send prescriptions to a different pharmacy because yours did not have your medication. Ask pharmacist to order more medicine or transfer the prescription to another pharmacy. (Read below.)  New 2023 regulation: "October 07, 2021 Revised Regulation Allows DEA-Registered Pharmacies to Transfer Electronic Prescriptions at a Patient's Request DEA Headquarters Division - Public Information Office Patients now have the ability to request their electronic prescription be transferred to another pharmacy without having to go back to their practitioner to initiate the request. This revised regulation went into effect on Monday, October 03, 2021.     At a patient's request, a DEA-registered retail pharmacy can now transfer an electronic prescription for a controlled substance (schedules II-V) to another DEA-registered retail pharmacy. Prior to this change, patients would have to go through their practitioner to  cancel their prescription and have it re-issued to a different pharmacy. The process was taxing and time consuming for both patients and practitioners.    The Drug Enforcement Administration [facility]) published its intent to revise the process for transferring electronic prescriptions on December 26, 2019.  The final rule was published in the federal register on September 01, 2021 and went into effect 30 days later.  Under the final rule, a prescription can only be transferred once between pharmacies, and only if allowed under existing state or other applicable law. The prescription must remain in its electronic form; may not be altered in any way; and the transfer must be communicated directly between two licensed pharmacists. It's important to note, any authorized refills transfer with the original prescription, which means the entire prescription will be filled at the same pharmacy".  Reference: HugeHand.is [facility] website announcement)  [email or web address] [employer] of Justice)   [employer] / Vol. 88, No. 143 / Thursday, September 01, 2021 / Rules and Regulations DEPARTMENT OF JUSTICE  Drug Enforcement Administration  21 CFR Part 1306  [Docket No. [id number]]  RIN [id number] Transfer of Electronic Prescriptions for Schedules II-V Controlled Substances Between Pharmacies for Initial Filling  ____________________________________________________________________________________________  ____________________________________________________________________________________________  Transfer of Pain Medication between Pharmacies  Re: 2023 DEA Clarification on existing regulation  Published on DEA Website: October 07, 2021  Title: Revised Regulation Allows DEA-Registered Pharmacies to Electrical engineer Prescriptions at a Patient's  Request DEA Headquarters Division - [employer]  "Patients now have the ability to request their electronic prescription be transferred to another pharmacy without having to go back to their practitioner to initiate the request. This revised regulation went into effect on Monday, October 03, 2021.     At a patient's request, a DEA-registered retail pharmacy can now transfer an electronic prescription for a controlled substance (schedules II-V) to another DEA-registered retail pharmacy. Prior to this change, patients would have to go through their practitioner to cancel their prescription and have it re-issued to a different pharmacy. The process was taxing and time consuming for both patients and practitioners.    The Drug Enforcement Administration [facility]) published its intent to revise the process for transferring electronic prescriptions on December 26, 2019.  The final rule was published in the federal register on September 01, 2021 and went into effect 30 days later.  Under the final rule, a prescription can only be transferred once between pharmacies, and only if allowed under existing state or other applicable law. The prescription must remain in its electronic form; may not be altered in any way; and the transfer must be communicated directly between two licensed pharmacists. It's important to note, any authorized refills transfer with the original prescription, which means the entire prescription will be filled at the same pharmacy."    REFERENCES: 1. DEA website announcement [email or web address]  2. Department of Justice website  [email or web address].pdf  3. DEPARTMENT OF JUSTICE Drug Enforcement Administration 21 CFR Part 1306 [Docket No. [id number]] RIN [id number] "Transfer of Electronic Prescriptions for Schedules II-V Controlled Substances  Between Pharmacies for Initial Filling"  ____________________________________________________________________________________________     _______________________________________________________________________  Medication Rules  Purpose: To inform patients, and their family members, of our medication rules and regulations.  Applies to: All patients receiving prescriptions from our practice (written or electronic).  Pharmacy of record: This is the pharmacy where your electronic prescriptions will be sent. Make sure we have the correct one.  Electronic prescriptions: In compliance with the [facility] Strengthen Opioid Misuse Prevention (STOP) Act of 2017 (Session Conni Elliot [phone]), effective February 06, 2018, all controlled substances must be electronically prescribed. Written prescriptions, faxing, or calling prescriptions to a pharmacy will no longer be done.  Prescription refills: These will be provided only during in-person appointments. No medications will be renewed without a "face-to-face" evaluation with your provider. Applies to all prescriptions.  NOTE: The following applies primarily to controlled substances (Opioid* Pain Medications).   Type of encounter (visit): For patients receiving controlled substances, face-to-face visits are required. (Not an option and not up to the patient.)  Patient's responsibilities: Pain Pills: Bring all pain pills to every appointment (except for procedure appointments). Pill Bottles: Bring pills in original pharmacy bottle. Bring bottle, even if empty. Always bring the bottle of the most recent fill.  Medication refills: You are responsible for knowing and keeping track of what medications you are taking and when is it that you will need a refill. The day before your appointment: write a list of all prescriptions that need to be refilled. The day of the appointment: give the list to the admitting nurse. Prescriptions will be written only  during appointments. No prescriptions will be written on procedure days. If you forget a  medication: it will not be "Called in", "Faxed", or "electronically sent". You will need to get another appointment to get these prescribed. No early refills. Do not call asking to have your prescription filled early. Partial  or short prescriptions: Occasionally your pharmacy may not have enough pills to fill your prescription.  NEVER ACCEPT a partial fill or a prescription that is short of the total amount of pills that you were prescribed.  With controlled substances the law allows 72 hours for the pharmacy to complete the prescription.  If the prescription is not completed within 72 hours, the pharmacist will require a new prescription to be written. This means that you will be short on your medicine and we WILL NOT send another prescription to complete your original prescription.  Instead, request the pharmacy to send a carrier to a nearby branch to get enough medication to provide you with your full prescription. Prescription Accuracy: You are responsible for carefully inspecting your prescriptions before leaving our office. Have the discharge nurse carefully go over each prescription with you, before taking them home. Make sure that your name is accurately spelled, that your address is correct. Check the name and dose of your medication to make sure it is accurate. Check the number of pills, and the written instructions to make sure they are clear and accurate. Make sure that you are given enough medication to last until your next medication refill appointment. Taking Medication: Take medication as prescribed. When it comes to controlled substances, taking less pills or less frequently than prescribed is permitted and encouraged. Never take more pills than instructed. Never take the medication more frequently than prescribed.  Inform other Doctors: Always inform, all of your healthcare providers, of all the  medications you take. Pain Medication from other Providers: You are not allowed to accept any additional pain medication from any other Doctor or Healthcare provider. There are two exceptions to this rule. (see below) In the event that you require additional pain medication, you are responsible for notifying us, as stated below. Cough Medicine: Often these contain an opioid, such as codeine or hydrocodone. Never accept or take cough medicine containing these opioids if you are already taking an opioid* medication. The combination may cause respiratory failure and death. Medication Agreement: You are responsible for carefully reading and following our Medication Agreement. This must be signed before receiving any prescriptions from our practice. Safely store a copy of your signed Agreement. Violations to the Agreement will result in no further prescriptions. (Additional copies of our Medication Agreement are available upon request.) Laws, Rules, & Regulations: All patients are expected to follow all [address] and [employer], [employer], Rules, [place] [place]. Ignorance of the Laws does not constitute a valid excuse.  Illegal drugs and Controlled Substances: The use of illegal substances (including, but not limited to marijuana and its derivatives) and/or the illegal use of any controlled substances is strictly prohibited. Violation of this rule may result in the immediate and permanent discontinuation of any and all prescriptions being written by our practice. The use of any illegal substances is prohibited. Adopted CDC guidelines & recommendations: Target dosing levels will be at or below 60 MME/day. Use of benzodiazepines** is not recommended.  Exceptions: There are only two exceptions to the rule of not receiving pain medications from other Healthcare Providers. Exception #1 (Emergencies): In the event of an emergency (i.e.: accident requiring emergency care), you are allowed to receive additional pain  medication. However, you are responsible for: As soon as  you are able, call our office [phone], at any time of the day or night, and leave a message stating your name, the date and nature of the emergency, and the name and dose of the medication prescribed. In the event that your call is answered by a member of our staff, make sure to document and save the date, time, and the name of the person that took your information.  Exception #2 (Planned Surgery): In the event that you are scheduled by another doctor or dentist to have any type of surgery or procedure, you are allowed (for a period no longer than 30 days), to receive additional pain medication, for the acute post-op pain. However, in this case, you are responsible for picking up a copy of our "Post-op Pain Management for Surgeons" handout, and giving it to your surgeon or dentist. This document is available at our office, and does not require an appointment to obtain it. Simply go to our office during business hours (Monday-Thursday from 8:00 AM to 4:00 PM) (Friday 8:00 AM to 12:00 Noon) or if you have a scheduled appointment with [place], prior to your surgery, and ask for it by name. In addition, you are responsible for: calling our office [phone], at any time of the day or night, and leaving a message stating your name, name of your surgeon, type of surgery, and date of procedure or surgery. Failure to comply with your responsibilities may result in termination of therapy involving the controlled substances. Medication Agreement Violation. Following the above rules, including your responsibilities will help you in avoiding a Medication Agreement Violation ("Breaking your Pain Medication Contract").  Consequences:  Not following the above rules may result in permanent discontinuation of medication prescription therapy.  *Opioid medications include: morphine, codeine, oxycodone, oxymorphone, hydrocodone, hydromorphone, meperidine, tramadol,  tapentadol, buprenorphine, fentanyl, methadone. **Benzodiazepine medications include: diazepam (Valium), alprazolam (Xanax), clonazepam (Klonopine), lorazepam (Ativan), clorazepate (Tranxene), chlordiazepoxide (Librium), estazolam (Prosom), oxazepam (Serax), temazepam (Restoril), triazolam (Halcion) (Last updated: 11/29/2021) ______________________________________________________________________    ______________________________________________________________________  Medication Recommendations and Reminders  Applies to: All patients receiving prescriptions (written and/or electronic).  Medication Rules & Regulations: You are responsible for reading, knowing, and following our "Medication Rules" document. These exist for your safety and that of others. They are not flexible and neither are we. Dismissing or ignoring them is an act of "non-compliance" that may result in complete and irreversible termination of such medication therapy. For safety reasons, "non-compliance" will not be tolerated. As with the [place] fundamental legal principle of "ignorance of the law is no defense", we will accept no excuses for not having read and knowing the content of documents provided to you by our practice.  Pharmacy of record:  Definition: This is the pharmacy where your electronic prescriptions will be sent.  We do not endorse any particular pharmacy. It is up to you and your insurance to decide what pharmacy to use.  We do not restrict you in your choice of pharmacy. However, once we write for your prescriptions, we will NOT be re-sending more prescriptions to fix restricted supply problems created by your pharmacy, or your insurance.  The pharmacy listed in the electronic medical record should be the one where you want electronic prescriptions to be sent. If you choose to change pharmacy, simply notify our nursing staff. Changes will be made only during your regular appointments and not over the  phone.  Recommendations: Keep all of your pain medications in a safe place, under lock and key, even  if you live alone. We will NOT replace lost, stolen, or damaged medication. We do not accept "Police Reports" as proof of medications having been stolen. After you fill your prescription, take 1 week's worth of pills and put them away in a safe place. You should keep a separate, properly labeled bottle for this purpose. The remainder should be kept in the original bottle. Use this as your primary supply, until it runs out. Once it's gone, then you know that you have 1 week's worth of medicine, and it is time to come in for a prescription refill. If you do this correctly, it is unlikely that you will ever run out of medicine. To make sure that the above recommendation works, it is very important that you make sure your medication refill appointments are scheduled at least 1 week before you run out of medicine. To do this in an effective manner, make sure that you do not leave the office without scheduling your next medication management appointment. Always ask the nursing staff to show you in your prescription , when your medication will be running out. Then arrange for the receptionist to get you a return appointment, at least 7 days before you run out of medicine. Do not wait until you have 1 or 2 pills left, to come in. This is very poor planning and does not take into consideration that we may need to cancel appointments due to bad weather, sickness, or emergencies affecting our staff. DO NOT ACCEPT A "Partial Fill": If for any reason your pharmacy does not have enough pills/tablets to completely fill or refill your prescription, do not allow for a "partial fill". The law allows the pharmacy to complete that prescription within 72 hours, without requiring a new prescription. If they do not fill the rest of your prescription within those 72 hours, you will need a separate prescription to fill the remaining  amount, which we will NOT provide. If the reason for the partial fill is your insurance, you will need to talk to the pharmacist about payment alternatives for the remaining tablets, but again, DO NOT ACCEPT A PARTIAL FILL, unless you can trust your pharmacist to obtain the remainder of the pills within 72 hours.  Prescription refills and/or changes in medication(s):  Prescription refills, and/or changes in dose or medication, will be conducted only during scheduled medication management appointments. (Applies to both, written and electronic prescriptions.) No refills on procedure days. No medication will be changed or started on procedure days. No changes, adjustments, and/or refills will be conducted on a procedure day. Doing so will interfere with the diagnostic portion of the procedure. No phone refills. No medications will be "called into the pharmacy". No Fax refills. No weekend refills. No Holliday refills. No after hours refills.  Remember:  Business hours are:  Monday to Thursday 8:00 AM to 4:00 PM Provider's Schedule: Delano Metz, MD - Appointments are:  Medication management: Monday and Wednesday 8:00 AM to 4:00 PM Procedure day: Tuesday and Thursday 7:30 AM to 4:00 PM Edward Jolly, MD - Appointments are:  Medication management: Tuesday and Thursday 8:00 AM to 4:00 PM Procedure day: Monday and Wednesday 7:30 AM to 4:00 PM (Last update: 11/29/2021) ______________________________________________________________________   ____________________________________________________________________________________________  Naloxone Nasal Spray  Why am I receiving this medication? [place] STOP ACT requires that all patients taking high dose opioids or at risk of opioids respiratory depression, be prescribed an opioid reversal agent, such as Naloxone (AKA: Narcan).  What is this medication? NALOXONE (  nal OX one) treats opioid overdose, which causes slow or shallow breathing,  severe drowsiness, or trouble staying awake. Call emergency services after using this medication. You may need additional treatment. Naloxone works by reversing the effects of opioids. It belongs to a group of medications called opioid blockers.  COMMON BRAND NAME(S): Kloxxado, Narcan  What should I tell my care team before I take this medication? They need to know if you have any of these conditions: Heart disease Substance use disorder An unusual or allergic reaction to naloxone, other medications, foods, dyes, or preservatives Pregnant or trying to get pregnant Breast-feeding  When to use this medication? This medication is to be used for the treatment of respiratory depression (less than 8 breaths per minute) secondary to opioid overdose.   How to use this medication? This medication is for use in the nose. Lay the person on their back. Support their neck with your hand and allow the head to tilt back before giving the medication. The nasal spray should be given into 1 nostril. After giving the medication, move the person onto their side. Do not remove or test the nasal spray until ready to use. Get emergency medical help right away after giving the first dose of this medication, even if the person wakes up. You should be familiar with how to recognize the signs and symptoms of a narcotic overdose. If more doses are needed, give the additional dose in the other nostril. Talk to your care team about the use of this medication in children. While this medication may be prescribed for children as young as newborns for selected conditions, precautions do apply.  Naloxone Overdosage: If you think you have taken too much of this medicine contact a poison control center or emergency room at once.  NOTE: This medicine is only for you. Do not share this medicine with others.  What if I miss a dose? This does not apply.  What may interact with this medication? This is only used during an  emergency. No interactions are expected during emergency use. This list may not describe all possible interactions. Give your health care provider a list of all the medicines, herbs, non-prescription drugs, or dietary supplements you use. Also tell them if you smoke, drink alcohol, or use illegal drugs. Some items may interact with your medicine.  What should I watch for while using this medication? Keep this medication ready for use in the case of an opioid overdose. Make sure that you have the phone number of your care team and local hospital ready. You may need to have additional doses of this medication. Each nasal spray contains a single dose. Some emergencies may require additional doses. After use, bring the treated person to the nearest hospital or call 911. Make sure the treating care team knows that the person has received a dose of this medication. You will receive additional instructions on what to do during and after use of this medication before an emergency occurs.  What side effects may I notice from receiving this medication? Side effects that you should report to your care team as soon as possible: Allergic reactions--skin rash, itching, hives, swelling of the face, lips, tongue, or throat Side effects that usually do not require medical attention (report these to your care team if they continue or are bothersome): Constipation Dryness or irritation inside the nose Headache Increase in blood pressure Muscle spasms Stuffy nose Toothache This list may not describe all possible side effects. Call your doctor for  medical advice about side effects. You may report side effects to FDA at 1-800-FDA-1088.  Where should I keep my medication? Because this is an emergency medication, you should keep it with you at all times.  Keep out of the reach of children and pets. Store between 20 and 25 degrees C (68 and 77 degrees F). Do not freeze. Throw away any unused medication after the  expiration date. Keep in original box until ready to use.  NOTE: This sheet is a summary. It may not cover all possible information. If you have questions about this medicine, talk to your doctor, pharmacist, or health care provider.   2023 Elsevier/Gold Standard (2020-10-01 00:00:00)  ____________________________________________________________________________________________

## 2022-09-04 NOTE — Progress Notes (Signed)
Nursing Pain Medication Assessment:  Safety precautions to be maintained throughout the outpatient stay will include: orient to surroundings, keep bed in low position, maintain call bell within reach at all times, provide assistance with transfer out of bed and ambulation.  Medication Inspection Compliance: Pill count conducted under aseptic conditions, in front of the patient. Neither the pills nor the bottle was removed from the patient's sight at any time. Once count was completed pills were immediately returned to the patient in their original bottle.  Medication #1: Oxycodone IR Pill/Patch Count:  11 of 120 pills remain Pill/Patch Appearance: Markings consistent with prescribed medication Bottle Appearance: Standard pharmacy container. Clearly labeled. Filled Date: 6 / 1 / 2024 Last Medication intake:   unknown  Medication #2: Oxycodone IR Pill/Patch Count:  12 of 120 pills remain Pill/Patch Appearance: Markings consistent with prescribed medication Bottle Appearance: Standard pharmacy container. Clearly labeled. Filled Date: 7 / 2 / 2024 Last Medication intake:  Today  Pt. Wife states after pt hospitalized 07/17/22 with pneumonia, they came home and refilled new Rx in July not realizing there were still pills left from June Rx.

## 2022-09-04 NOTE — Progress Notes (Signed)
Established Patient Office Visit  Subjective:  Patient ID: Chase Mendez, male    DOB: Nov 20, 1934  Age: 88 y.o. MRN: [id number]  Chief Complaint  Patient presents with   Follow-up    3 month follow up    No falls in the past month. Labs reviewed and notable for hypernatremia, elevated Cr and gfr of 51 to which he admits poor oral fluid intake. His thyroid is also underreplaced with slight rise in platelet count. BP has improved with reduction of amlodipine dose.    No other concerns at this time.   Past Medical History:  Diagnosis Date   Acute encephalopathy 12/08/2014   Anxiety    ARF (acute renal failure) (HCC) 12/08/2014   Back pain    Benign neoplasm of large bowel    Capsulitis    fractured displaced metatarsal with capsulitis   Chronic back pain    Coronavirus infection    Depression    Diabetes mellitus without complication (HCC)    no medications currently   Dysphagia    Exostosis    painful, right hallux   Foot drop, left    wears a brace   Frequent falls 02/2018   poor balance, foot drop   GERD (gastroesophageal reflux disease)    Gout    Hypertension    Hypothyroidism    Insomnia    Low testosterone    Microscopic hematuria 2016   Myocardial infarction [facility])    patient unaware when it happened years ago.     Pneumonia 12/07/2014   Pressure ulcer 12/09/2014   Sepsis (HCC) 12/08/2014   Thyroid disease     Past Surgical History:  Procedure Laterality Date   APPENDECTOMY     BACK SURGERY  1995, 1996   x 2   CHOLECYSTECTOMY     ESOPHAGOGASTRODUODENOSCOPY (EGD) WITH PROPOFOL N/A 04/24/2017   Procedure: ESOPHAGOGASTRODUODENOSCOPY (EGD) WITH PROPOFOL;  Surgeon: Scot Jun, MD;  Location: [facility] ENDOSCOPY;  Service: Endoscopy;  Laterality: N/A;   EYE SURGERY Bilateral 1983, 1985   cataract extractions   SPINAL CORD STIMULATOR INSERTION N/A 03/13/2018   Procedure: SPINAL CORD STIMULATOR INSERTION;  Surgeon: Venetia Night, MD;  Location: [facility]  ORS;  Service: Neurosurgery;  Laterality: N/A;    Social History   Socioeconomic History   Marital status: Married    Spouse name: dorothy   Number of children: 2   Years of education: Not on file   Highest education level: Some college, no degree  Occupational History   Occupation: maintenance / repair    Comment: disabled  Tobacco Use   Smoking status: Every Day    Current packs/day: 0.50    Average packs/day: 0.5 packs/day for 70.0 years (35.0 ttl pk-yrs)    Types: Cigars, Cigarettes   Smokeless tobacco: Never   Tobacco comments:    unable to give cessation materials due to webex visit   Vaping Use   Vaping status: Never Used  Substance and Sexual Activity   Alcohol use: No   Drug use: Yes    Types: Oxycodone   Sexual activity: Not Currently  Other Topics Concern   Not on file  Social History Narrative   Not on file   Social Determinants of Health   Financial Resource Strain: Not on file  Food Insecurity: No Food Insecurity (07/17/2022)   Hunger Vital Sign    Worried About Running Out of Food in the Last Year: Never true    Ran Out of Food in the  Last Year: Never true  Transportation Needs: No Transportation Needs (07/17/2022)   PRAPARE - Administrator, Civil Service (Medical): No    Lack of Transportation (Non-Medical): No  Physical Activity: Not on file  Stress: Not on file  Social Connections: Not on file  Intimate Partner Violence: Not At Risk (07/17/2022)   Humiliation, Afraid, Rape, and Kick questionnaire    Fear of Current or Ex-Partner: No    Emotionally Abused: No    Physically Abused: No    Sexually Abused: No    Family History  Problem Relation Age of Onset   Heart disease Mother    Diabetes Father    Kidney cancer Brother     Allergies  Allergen Reactions   Doxycycline Nausea Only   Ibuprofen Nausea Only   Sulfa Antibiotics Nausea Only    Review of Systems  Constitutional: Negative.   HENT: Negative.    Eyes: Negative.    Cardiovascular: Negative.   Gastrointestinal:  Positive for nausea.  Genitourinary: Negative.   Skin: Negative.   Neurological: Negative.   Endo/Heme/Allergies: Negative.   Psychiatric/Behavioral:  Positive for depression. The patient is nervous/anxious.        Objective:   BP (!) 118/58   Pulse 66   Ht 6' (1.829 m)   Wt 189 lb 9.6 oz (86 kg)   SpO2 95%   BMI 25.71 kg/m   Vitals:   09/04/22 0959  BP: (!) 118/58  Pulse: 66  Height: 6' (1.829 m)  Weight: 189 lb 9.6 oz (86 kg)  SpO2: 95%  BMI (Calculated): 25.71    Physical Exam Vitals reviewed.  Constitutional:      Appearance: Normal appearance.  HENT:     Head: Normocephalic.     Left Ear: There is no impacted cerumen.     Nose: Nose normal.     Mouth/Throat:     Mouth: Mucous membranes are dry.     Pharynx: No posterior oropharyngeal erythema.  Eyes:     Extraocular Movements: Extraocular movements intact.     Pupils: Pupils are equal, round, and reactive to light.  Cardiovascular:     Rate and Rhythm: Regular rhythm.     Chest Wall: PMI is not displaced.     Pulses: Normal pulses.     Heart sounds: Normal heart sounds. No murmur heard. Pulmonary:     Effort: Pulmonary effort is normal.     Breath sounds: Normal air entry. No rhonchi or rales.  Abdominal:     General: Abdomen is flat. Bowel sounds are normal. There is no distension.     Palpations: Abdomen is soft. There is no hepatomegaly, splenomegaly or mass.     Tenderness: There is no abdominal tenderness.  Musculoskeletal:        General: Normal range of motion.     Cervical back: Normal range of motion and neck supple.     Right lower leg: No edema.     Left lower leg: No edema.  Skin:    General: Skin is warm and dry.     Comments: Decreased skin turgor  Neurological:     General: No focal deficit present.     Mental Status: He is alert and oriented to person, place, and time.     Cranial Nerves: No cranial nerve deficit.     Motor: No  weakness.  Psychiatric:        Mood and Affect: Mood normal.  Behavior: Behavior normal.      No results found for any visits on 09/04/22.  Recent Results (from the past 2160 hour([name]))  Urinalysis, Complete     Status: Abnormal   Collection Time: 07/12/22 10:32 AM  Result Value Ref Range   Specific Gravity, UA 1.020 1.005 - 1.030   pH, UA 6.0 5.0 - 7.5   Color, UA Yellow Yellow   Appearance Ur Clear Clear   Leukocytes,UA Negative Negative   Protein,UA Trace (A) Negative/Trace   Glucose, UA Negative Negative   Ketones, UA Negative Negative   RBC, UA Negative Negative   Bilirubin, UA Negative Negative   Urobilinogen, Ur >8.0 (H) 0.2 - 1.0 mg/dL   Nitrite, UA Negative Negative   Microscopic Examination See below:   Microscopic Examination     Status: Abnormal   Collection Time: 07/12/22 10:32 AM   Urine  Result Value Ref Range   WBC, UA 0-5 0 - 5 /hpf   RBC, Urine 0-2 0 - 2 /hpf   Epithelial Cells (non renal) 0-10 0 - 10 /hpf   Casts Present (A) None seen /lpf   Cast Type Hyaline casts N/A   Mucus, UA Present (A) Not Estab.   Bacteria, UA Moderate (A) None seen/Few  Bladder Scan (Post Void Residual) in office     Status: None   Collection Time: 07/12/22 10:39 AM  Result Value Ref Range   Scan Result 19   Testosterone     Status: None   Collection Time: 07/12/22 11:24 AM  Result Value Ref Range   Testosterone 513 264 - 916 ng/dL    Comment: Adult male reference interval is based on a population of healthy nonobese males (BMI <30) between 26 and 25 years old. Travison, et.al. JCEM [phone]. PMID: [id number].   POCT XPERT XPRESS SARS COVID-2/FLU/RSV     Status: None   Collection Time: 07/14/22 10:10 AM  Result Value Ref Range   SARS Coronavirus 2 neg    FLU A neg    FLU B neg    RSV RNA, PCR neg   Comprehensive metabolic panel     Status: Abnormal   Collection Time: 07/17/22  4:34 AM  Result Value Ref Range   Sodium 139 135 - 145 mmol/L   Potassium  4.2 3.5 - 5.1 mmol/L   Chloride 103 98 - 111 mmol/L   CO2 27 22 - 32 mmol/L   Glucose, Bld 133 (H) 70 - 99 mg/dL    Comment: Glucose reference range applies only to samples taken after fasting for at least 8 hours.   BUN 27 (H) 8 - 23 mg/dL   Creatinine, Ser 1.66 (H) 0.61 - 1.24 mg/dL   Calcium 8.7 (L) 8.9 - 10.3 mg/dL   Total Protein 7.2 6.5 - 8.1 g/dL   Albumin 3.9 3.5 - 5.0 g/dL   AST 23 15 - 41 U/L   ALT 16 0 - 44 U/L   Alkaline Phosphatase 108 38 - 126 U/L   Total Bilirubin 0.8 0.3 - 1.2 mg/dL   GFR, Estimated 36 (L) >60 mL/min    Comment: (NOTE) Calculated using the CKD-EPI Creatinine Equation (2021)    Anion gap 9 5 - 15    Comment: Performed at [facility], [address] [place], [place] [address]  CBC with Differential     Status: Abnormal   Collection Time: 07/17/22  4:34 AM  Result Value Ref Range   WBC 10.9 (H) 4.0 - 10.5 K/uL  RBC 4.80 4.22 - 5.81 MIL/uL   Hemoglobin 14.0 13.0 - 17.0 g/dL   HCT 40.9 81.1 - 91.4 %   MCV 92.5 80.0 - 100.0 fL   MCH 29.2 26.0 - 34.0 pg   MCHC 31.5 30.0 - 36.0 g/dL   RDW 78.2 95.6 - 21.3 %   Platelets 175 150 - 400 K/uL   nRBC 0.0 0.0 - 0.2 %   Neutrophils Relative % 75 %   Neutro Abs 8.3 (H) 1.7 - 7.7 K/uL   Lymphocytes Relative 11 %   Lymphs Abs 1.2 0.7 - 4.0 K/uL   Monocytes Relative 9 %   Monocytes Absolute 0.9 0.1 - 1.0 K/uL   Eosinophils Relative 3 %   Eosinophils Absolute 0.4 0.0 - 0.5 K/uL   Basophils Relative 1 %   Basophils Absolute 0.1 0.0 - 0.1 K/uL   Immature Granulocytes 1 %   Abs Immature Granulocytes 0.06 0.00 - 0.07 K/uL    Comment: Performed at [facility], [address] [place], [place] [address]  Protime-INR     Status: None   Collection Time: 07/17/22  4:34 AM  Result Value Ref Range   Prothrombin Time 13.5 11.4 - 15.2 seconds   INR 1.0 0.8 - 1.2    Comment: (NOTE) INR goal varies based on device and disease states. Performed at [facility], [address]  [place], [place] [address]   APTT     Status: None   Collection Time: 07/17/22  4:34 AM  Result Value Ref Range   aPTT 30 24 - 36 seconds    Comment: Performed at [facility], [address] [place], [place] [address]  Blood Culture (routine x 2)     Status: None   Collection Time: 07/17/22  4:34 AM   Specimen: BLOOD  Result Value Ref Range   Specimen Description BLOOD RIGHT FA    Special Requests      BOTTLES DRAWN AEROBIC AND ANAEROBIC Blood Culture adequate volume   Culture      NO GROWTH 5 DAYS Performed at [facility], [address] [place], [place] [address]    Report Status 07/22/2022 FINAL   Troponin I (High Sensitivity)     Status: None   Collection Time: 07/17/22  4:34 AM  Result Value Ref Range   Troponin I (High Sensitivity) 6 <18 ng/L    Comment: (NOTE) Elevated high sensitivity troponin I (hsTnI) values and significant  changes across serial measurements may suggest ACS but many other  chronic and acute conditions are known to elevate hsTnI results.  Refer to the "Links" section for chest pain algorithms and additional  guidance. Performed at [facility], [address] [place], [place] [address]   Lactic acid, plasma     Status: Abnormal   Collection Time: 07/17/22  5:34 AM  Result Value Ref Range   Lactic Acid, Venous 2.1 (HH) 0.5 - 1.9 mmol/L    Comment: CRITICAL RESULT CALLED TO, READ BACK BY AND VERIFIED WITH NADIA [facility] AT 0645 07/17/2022 DLB Performed at [facility], [address] [place], [place] [address]   Blood Culture (routine x 2)     Status: None   Collection Time: 07/17/22  5:34 AM   Specimen: BLOOD  Result Value Ref Range   Specimen Description BLOOD LEFT AC    Special Requests      BOTTLES DRAWN AEROBIC AND ANAEROBIC Blood Culture results may not be optimal due to an  excessive volume of blood received in culture bottles   Culture      NO GROWTH 5 DAYS Performed at [facility], [address] [place], [place] [address]    Report Status 07/22/2022 FINAL   Brain natriuretic peptide     Status: Abnormal   Collection Time: 07/17/22  5:34 AM  Result Value Ref Range   B Natriuretic Peptide 133.2 (H) 0.0 - 100.0 pg/mL    Comment: Performed at [facility], [address] [place], [place] [address]  Urinalysis, w/ Reflex to Culture (Infection Suspected) -Urine, Clean Catch     Status: Abnormal   Collection Time: 07/17/22  6:01 AM  Result Value Ref Range   Specimen Source URINE, CLEAN CATCH    Color, Urine YELLOW (A) YELLOW   APPearance CLEAR (A) CLEAR   Specific Gravity, Urine 1.021 1.005 - 1.030   pH 5.0 5.0 - 8.0   Glucose, UA NEGATIVE NEGATIVE mg/dL   Hgb urine dipstick NEGATIVE NEGATIVE   Bilirubin Urine NEGATIVE NEGATIVE   Ketones, ur NEGATIVE NEGATIVE mg/dL   Protein, ur NEGATIVE NEGATIVE mg/dL   Nitrite NEGATIVE NEGATIVE   Leukocytes,Ua NEGATIVE NEGATIVE   RBC / HPF 0-5 0 - 5 RBC/hpf   WBC, UA 0-5 0 - 5 WBC/hpf    Comment:        Reflex urine culture not performed if WBC <=10, OR if Squamous epithelial cells >5. If Squamous epithelial cells >5 suggest recollection.    Bacteria, UA RARE (A) NONE SEEN   Squamous Epithelial / HPF 0-5 0 - 5 /HPF   Mucus PRESENT     Comment: Performed at [facility], [address] [place], [place] [address]  Strep pneumoniae urinary antigen     Status: None   Collection Time: 07/17/22  6:01 AM  Result Value Ref Range   Strep Pneumo Urinary Antigen NEGATIVE NEGATIVE    Comment:        Infection due to Primrose Oler. pneumoniae cannot be absolutely ruled out since the antigen present may be below the detection limit of the test. Performed at [facility], [address] [place], [place] [address]   Legionella Pneumophila Serogp 1 Ur Ag     Status: None   Collection Time: 07/17/22  6:01 AM  Result Value Ref Range   L. pneumophila Serogp 1 Ur Ag Negative Negative    Comment: (NOTE) Presumptive  negative for L. pneumophila serogroup 1 antigen in urine, suggesting no recent or current infection. Legionnaires' disease cannot be ruled out since other serogroups and species may also cause disease. Performed At: [facility] [address] [place], [place] [id number] Jolene Schimke MD BM:[id number]    Source of Sample URINE, RANDOM     Comment: Performed at [facility], [address] [place], [place] [address]  Resp panel by RT-PCR (RSV, Flu A&B, Covid) Anterior Nasal Swab     Status: None   Collection Time: 07/17/22  6:02 AM   Specimen: Anterior Nasal Swab  Result Value Ref Range   SARS Coronavirus 2 by RT PCR NEGATIVE NEGATIVE    Comment: (NOTE) SARS-CoV-2 target nucleic acids are NOT DETECTED.  The SARS-CoV-2 RNA is generally detectable in upper respiratory specimens during the acute phase of infection. The lowest concentration of SARS-CoV-2 viral copies this assay can detect is 138 copies/mL. A negative result does not preclude SARS-Cov-2 infection and should not be used as the sole basis for treatment or other patient management  decisions. A negative result may occur with  improper specimen collection/handling, submission of specimen other than nasopharyngeal swab, presence of viral mutation(Clerance Umland) within the areas targeted by this assay, and inadequate number of viral copies(<138 copies/mL). A negative result must be combined with clinical observations, patient history, and epidemiological information. The expected result is Negative.  Fact Sheet for Patients:  BloggerCourse.com  Fact Sheet for Healthcare Providers:  SeriousBroker.it  This test is no t yet approved or cleared by the [place] FDA and  has been authorized for detection and/or diagnosis of SARS-CoV-2 by FDA under an Emergency Use Authorization (EUA). This EUA will remain  in effect (meaning this test can be used) for the duration of  the COVID-19 declaration under Section 564(b)(1) of the Act, 21 U.Azarie Coriz.C.section 360bbb-3(b)(1), unless the authorization is terminated  or revoked sooner.       Influenza A by PCR NEGATIVE NEGATIVE   Influenza B by PCR NEGATIVE NEGATIVE    Comment: (NOTE) The Xpert Xpress SARS-CoV-2/FLU/RSV plus assay is intended as an aid in the diagnosis of influenza from Nasopharyngeal swab specimens and should not be used as a sole basis for treatment. Nasal washings and aspirates are unacceptable for Xpert Xpress SARS-CoV-2/FLU/RSV testing.  Fact Sheet for Patients: BloggerCourse.com  Fact Sheet for Healthcare Providers: SeriousBroker.it  This test is not yet approved or cleared by the [place] FDA and has been authorized for detection and/or diagnosis of SARS-CoV-2 by FDA under an Emergency Use Authorization (EUA). This EUA will remain in effect (meaning this test can be used) for the duration of the COVID-19 declaration under Section 564(b)(1) of the Act, 21 U.Sherylann Vangorden.C. section 360bbb-3(b)(1), unless the authorization is terminated or revoked.     Resp Syncytial Virus by PCR NEGATIVE NEGATIVE    Comment: (NOTE) Fact Sheet for Patients: BloggerCourse.com  Fact Sheet for Healthcare Providers: SeriousBroker.it  This test is not yet approved or cleared by the [place] FDA and has been authorized for detection and/or diagnosis of SARS-CoV-2 by FDA under an Emergency Use Authorization (EUA). This EUA will remain in effect (meaning this test can be used) for the duration of the COVID-19 declaration under Section 564(b)(1) of the Act, 21 U.Delcia Spitzley.C. section 360bbb-3(b)(1), unless the authorization is terminated or revoked.  Performed at [facility], [address] [place], [place] [address]   Blood gas, venous     Status: Abnormal   Collection Time: 07/17/22  6:02 AM  Result  Value Ref Range   pH, Ven 7.3 7.25 - 7.43   pCO2, Ven 60 44 - 60 mmHg   pO2, Ven 38 32 - 45 mmHg   Bicarbonate 29.5 (H) 20.0 - 28.0 mmol/L   Acid-Base Excess 1.6 0.0 - 2.0 mmol/L   O2 Saturation 68.5 %   Patient temperature 37.0    Collection site VENOUS     Comment: Performed at [facility], [address] [place], [place] [address]  Lactic acid, plasma     Status: None   Collection Time: 07/17/22  7:35 AM  Result Value Ref Range   Lactic Acid, Venous 1.7 0.5 - 1.9 mmol/L    Comment: Performed at [facility], [address] [place], [place] [address]  Troponin I (High Sensitivity)     Status: None   Collection Time: 07/17/22  7:35 AM  Result Value Ref Range   Troponin I (High Sensitivity) 10 <18 ng/L    Comment: (NOTE) Elevated high sensitivity troponin I (hsTnI) values and significant  changes across serial measurements may suggest ACS but many other  chronic and acute conditions are known to elevate hsTnI results.  Refer to the "Links" section for chest pain algorithms and additional  guidance. Performed at [facility], [address] [place], [place] [address]   Procalcitonin     Status: None   Collection Time: 07/17/22  7:35 AM  Result Value Ref Range   Procalcitonin <0.10 ng/mL    Comment:        Interpretation: PCT (Procalcitonin) <= 0.5 ng/mL: Systemic infection (sepsis) is not likely. Local bacterial infection is possible. (NOTE)       Sepsis PCT Algorithm           Lower Respiratory Tract                                      Infection PCT Algorithm    ----------------------------     ----------------------------         PCT < 0.25 ng/mL                PCT < 0.10 ng/mL          Strongly encourage             Strongly discourage   discontinuation of antibiotics    initiation of antibiotics    ----------------------------     -----------------------------       PCT 0.25 - 0.50 ng/mL            PCT 0.10 - 0.25 ng/mL                OR       >80% decrease in PCT            Discourage initiation of                                            antibiotics      Encourage discontinuation           of antibiotics    ----------------------------     -----------------------------         PCT >= 0.50 ng/mL              PCT 0.26 - 0.50 ng/mL               AND        <80% decrease in PCT             Encourage initiation of                                             antibiotics       Encourage continuation           of antibiotics    ----------------------------     -----------------------------        PCT >= 0.50 ng/mL                  PCT > 0.50 ng/mL               AND         increase in PCT  Strongly encourage                                      initiation of antibiotics    Strongly encourage escalation           of antibiotics                                     -----------------------------                                           PCT <= 0.25 ng/mL                                                 OR                                        > 80% decrease in PCT                                      Discontinue / Do not initiate                                             antibiotics  Performed at [facility], [address] [place], [place] [address]   C-reactive protein     Status: None   Collection Time: 07/17/22  7:35 AM  Result Value Ref Range   CRP 0.6 <1.0 mg/dL    Comment: Performed at [facility], [address] [place], [place] [address]  D-dimer, quantitative     Status: Abnormal   Collection Time: 07/17/22  7:35 AM  Result Value Ref Range   D-Dimer, Quant 1.00 (H) 0.00 - 0.50 ug/mL-FEU    Comment: (NOTE) At the manufacturer cut-off value of 0.5 g/mL FEU, this assay has a negative predictive value of 95-100%.This assay is intended for use in conjunction with a clinical pretest probability (PTP) assessment model to exclude pulmonary embolism (PE) and deep venous  thrombosis (DVT) in outpatients suspected of PE or DVT. Results should be correlated with clinical presentation. Performed at [facility], [address] [place], [place] [address]   Fibrinogen     Status: None   Collection Time: 07/17/22  7:35 AM  Result Value Ref Range   Fibrinogen 392 210 - 475 mg/dL    Comment: (NOTE) Fibrinogen results may be underestimated in patients receiving thrombolytic therapy. Performed at [facility], [address] [place], [place] [address]   MRSA Next Gen by PCR, Nasal     Status: None   Collection Time: 07/17/22  3:24 PM   Specimen: Nasal Mucosa; Nasal Swab  Result Value Ref Range   MRSA by PCR Next Gen NOT DETECTED NOT DETECTED    Comment: (NOTE) The GeneXpert MRSA Assay (  FDA approved for NASAL specimens only), is one component of a comprehensive MRSA colonization surveillance program. It is not intended to diagnose MRSA infection nor to guide or monitor treatment for MRSA infections. Test performance is not FDA approved in patients less than 95 years old. Performed at [facility], [address] [place], [place] [address]   Glucose, capillary     Status: Abnormal   Collection Time: 07/17/22  3:47 PM  Result Value Ref Range   Glucose-Capillary 177 (H) 70 - 99 mg/dL    Comment: Glucose reference range applies only to samples taken after fasting for at least 8 hours.  Glucose, capillary     Status: Abnormal   Collection Time: 07/17/22  9:35 PM  Result Value Ref Range   Glucose-Capillary 154 (H) 70 - 99 mg/dL    Comment: Glucose reference range applies only to samples taken after fasting for at least 8 hours.  Respiratory (~20 pathogens) panel by PCR     Status: None   Collection Time: 07/17/22 10:19 PM   Specimen: Nasopharyngeal Swab; Respiratory  Result Value Ref Range   Adenovirus NOT DETECTED NOT DETECTED   Coronavirus 229E NOT DETECTED NOT DETECTED    Comment: (NOTE) The Coronavirus on the  Respiratory Panel, DOES NOT test for the novel  Coronavirus (2019 nCoV)    Coronavirus HKU1 NOT DETECTED NOT DETECTED   Coronavirus NL63 NOT DETECTED NOT DETECTED   Coronavirus OC43 NOT DETECTED NOT DETECTED   Metapneumovirus NOT DETECTED NOT DETECTED   Rhinovirus / Enterovirus NOT DETECTED NOT DETECTED   Influenza A NOT DETECTED NOT DETECTED   Influenza B NOT DETECTED NOT DETECTED   Parainfluenza Virus 1 NOT DETECTED NOT DETECTED   Parainfluenza Virus 2 NOT DETECTED NOT DETECTED   Parainfluenza Virus 3 NOT DETECTED NOT DETECTED   Parainfluenza Virus 4 NOT DETECTED NOT DETECTED   Respiratory Syncytial Virus NOT DETECTED NOT DETECTED   Bordetella pertussis NOT DETECTED NOT DETECTED   Bordetella Parapertussis NOT DETECTED NOT DETECTED   Chlamydophila pneumoniae NOT DETECTED NOT DETECTED   Mycoplasma pneumoniae NOT DETECTED NOT DETECTED    Comment: Performed at [facility], [address], [place], [place] [address]  C-reactive protein     Status: Abnormal   Collection Time: 07/18/22  3:53 AM  Result Value Ref Range   CRP 8.1 (H) <1.0 mg/dL    Comment: Performed at [facility], [address] [place], [place] [address]  D-dimer, quantitative     Status: Abnormal   Collection Time: 07/18/22  3:53 AM  Result Value Ref Range   D-Dimer, Quant 0.68 (H) 0.00 - 0.50 ug/mL-FEU    Comment: (NOTE) At the manufacturer cut-off value of 0.5 g/mL FEU, this assay has a negative predictive value of 95-100%.This assay is intended for use in conjunction with a clinical pretest probability (PTP) assessment model to exclude pulmonary embolism (PE) and deep venous thrombosis (DVT) in outpatients suspected of PE or DVT. Results should be correlated with clinical presentation. Performed at [facility], [address] [place], [place] [address]   Fibrinogen     Status: Abnormal   Collection Time: 07/18/22  3:53 AM  Result Value Ref Range   Fibrinogen 486 (H) 210 - 475 mg/dL     Comment: (NOTE) Fibrinogen results may be underestimated in patients receiving thrombolytic therapy. Performed at [facility], [address] [place], [place] [address]   CBC     Status: Abnormal  Collection Time: 07/18/22  3:53 AM  Result Value Ref Range   WBC 12.4 (H) 4.0 - 10.5 K/uL   RBC 4.26 4.22 - 5.81 MIL/uL   Hemoglobin 12.6 (L) 13.0 - 17.0 g/dL   HCT 82.9 (L) 56.2 - 13.0 %   MCV 89.7 80.0 - 100.0 fL   MCH 29.6 26.0 - 34.0 pg   MCHC 33.0 30.0 - 36.0 g/dL   RDW 86.5 78.4 - 69.6 %   Platelets 134 (L) 150 - 400 K/uL   nRBC 0.0 0.0 - 0.2 %    Comment: Performed at [facility], [address], [place], [place] [address]  Renal function panel     Status: Abnormal   Collection Time: 07/18/22  3:53 AM  Result Value Ref Range   Sodium 140 135 - 145 mmol/L   Potassium 3.7 3.5 - 5.1 mmol/L   Chloride 107 98 - 111 mmol/L   CO2 27 22 - 32 mmol/L   Glucose, Bld 119 (H) 70 - 99 mg/dL    Comment: Glucose reference range applies only to samples taken after fasting for at least 8 hours.   BUN 20 8 - 23 mg/dL   Creatinine, Ser 4.13 0.61 - 1.24 mg/dL   Calcium 8.2 (L) 8.9 - 10.3 mg/dL   Phosphorus 2.8 2.5 - 4.6 mg/dL   Albumin 3.2 (L) 3.5 - 5.0 g/dL   GFR, Estimated >24 >40 mL/min    Comment: (NOTE) Calculated using the CKD-EPI Creatinine Equation (2021)    Anion gap 6 5 - 15    Comment: Performed at [facility], [address], [place], [place] [address]  Magnesium     Status: None   Collection Time: 07/18/22  3:53 AM  Result Value Ref Range   Magnesium 2.2 1.7 - 2.4 mg/dL    Comment: Performed at [facility], [address] [place], [place] [address]  Hemoglobin A1c     Status: None   Collection Time: 07/18/22  3:53 AM  Result Value Ref Range   Hgb A1c MFr Bld 5.6 4.8 - 5.6 %    Comment: (NOTE) Pre diabetes:          5.7%-6.4%  Diabetes:              >6.4%  Glycemic control for   <7.0% adults with diabetes    Mean  Plasma Glucose 114.02 mg/dL    Comment: Performed at [facility], [address] [place] [address], [place], [place] [address]  Glucose, capillary     Status: Abnormal   Collection Time: 07/18/22  8:02 AM  Result Value Ref Range   Glucose-Capillary 124 (H) 70 - 99 mg/dL    Comment: Glucose reference range applies only to samples taken after fasting for at least 8 hours.  SARS Coronavirus 2 by RT PCR (hospital order, performed in [facility] hospital lab) *cepheid single result test* Anterior Nasal Swab     Status: None   Collection Time: 07/18/22 11:09 AM   Specimen: Anterior Nasal Swab  Result Value Ref Range   SARS Coronavirus 2 by RT PCR NEGATIVE NEGATIVE    Comment: (NOTE) SARS-CoV-2 target nucleic acids are NOT DETECTED.  The SARS-CoV-2 RNA is generally detectable in upper and lower respiratory specimens during the acute phase of infection. The lowest concentration of SARS-CoV-2 viral copies this assay can detect is 250 copies / mL. A negative result does not preclude SARS-CoV-2 infection and should not be used as the sole basis for  treatment or other patient management decisions.  A negative result may occur with improper specimen collection / handling, submission of specimen other than nasopharyngeal swab, presence of viral mutation(Saliha Salts) within the areas targeted by this assay, and inadequate number of viral copies (<250 copies / mL). A negative result must be combined with clinical observations, patient history, and epidemiological information.  Fact Sheet for Patients:   RoadLapTop.co.za  Fact Sheet for Healthcare Providers: http://kim-miller.com/  This test is not yet approved or  cleared by the [place] FDA and has been authorized for detection and/or diagnosis of SARS-CoV-2 by FDA under an Emergency Use Authorization (EUA).  This EUA will remain in effect (meaning this test can be used) for the duration of the COVID-19 declaration  under Section 564(b)(1) of the Act, 21 [place]C. section 360bbb-3(b)(1), unless the authorization is terminated or revoked sooner.  Performed at [facility], [address] [place], [place] [address]   Glucose, capillary     Status: Abnormal   Collection Time: 07/18/22 12:11 PM  Result Value Ref Range   Glucose-Capillary 171 (H) 70 - 99 mg/dL    Comment: Glucose reference range applies only to samples taken after fasting for at least 8 hours.  Glucose, capillary     Status: Abnormal   Collection Time: 07/18/22  4:19 PM  Result Value Ref Range   Glucose-Capillary 149 (H) 70 - 99 mg/dL    Comment: Glucose reference range applies only to samples taken after fasting for at least 8 hours.  Glucose, capillary     Status: Abnormal   Collection Time: 07/18/22  8:25 PM  Result Value Ref Range   Glucose-Capillary 174 (H) 70 - 99 mg/dL    Comment: Glucose reference range applies only to samples taken after fasting for at least 8 hours.  CBC     Status: Abnormal   Collection Time: 07/19/22  4:40 AM  Result Value Ref Range   WBC 14.2 (H) 4.0 - 10.5 K/uL   RBC 4.64 4.22 - 5.81 MIL/uL   Hemoglobin 13.5 13.0 - 17.0 g/dL   HCT 60.1 09.3 - 23.5 %   MCV 86.9 80.0 - 100.0 fL   MCH 29.1 26.0 - 34.0 pg   MCHC 33.5 30.0 - 36.0 g/dL   RDW 57.3 22.0 - 25.4 %   Platelets 147 (L) 150 - 400 K/uL    Comment: REPEATED TO VERIFY   nRBC 0.0 0.0 - 0.2 %    Comment: Performed at [facility], [address] [place], [place] [address]  C-reactive protein     Status: Abnormal   Collection Time: 07/19/22  4:40 AM  Result Value Ref Range   CRP 6.6 (H) <1.0 mg/dL    Comment: Performed at [facility], [address], [place], [place] [address]  Renal function panel     Status: Abnormal   Collection Time: 07/19/22  4:40 AM  Result Value Ref Range   Sodium 136 135 - 145 mmol/L   Potassium 4.0 3.5 - 5.1 mmol/L   Chloride 103 98 - 111 mmol/L   CO2 25 22 - 32 mmol/L   Glucose, Bld 152  (H) 70 - 99 mg/dL    Comment: Glucose reference range applies only to samples taken after fasting for at least 8 hours.   BUN 19 8 - 23 mg/dL   Creatinine, Ser 3.15 0.61 - 1.24 mg/dL   Calcium 8.5 (L) 8.9 - 10.3 mg/dL   Phosphorus 2.6 2.5 - 4.6  mg/dL   Albumin 3.3 (L) 3.5 - 5.0 g/dL   GFR, Estimated >09 >81 mL/min    Comment: (NOTE) Calculated using the CKD-EPI Creatinine Equation (2021)    Anion gap 8 5 - 15    Comment: Performed at [facility], [address] [place], [place] [address]  Glucose, capillary     Status: Abnormal   Collection Time: 07/19/22  7:54 AM  Result Value Ref Range   Glucose-Capillary 132 (H) 70 - 99 mg/dL    Comment: Glucose reference range applies only to samples taken after fasting for at least 8 hours.  Glucose, capillary     Status: Abnormal   Collection Time: 07/19/22 11:58 AM  Result Value Ref Range   Glucose-Capillary 158 (H) 70 - 99 mg/dL    Comment: Glucose reference range applies only to samples taken after fasting for at least 8 hours.  TSH     Status: Abnormal   Collection Time: 08/31/22  9:54 AM  Result Value Ref Range   TSH 4.510 (H) 0.450 - 4.500 uIU/mL  Lipid panel     Status: Abnormal   Collection Time: 08/31/22  9:54 AM  Result Value Ref Range   Cholesterol, Total 94 (L) 100 - 199 mg/dL   Triglycerides 71 0 - 149 mg/dL   HDL 34 (L) >82 mg/dL   VLDL Cholesterol Cal 15 5 - 40 mg/dL   LDL Chol Calc (NIH) 45 0 - 99 mg/dL   Chol/HDL Ratio 2.8 0.0 - 5.0 ratio    Comment:                                   T. Chol/HDL Ratio                                             Men  Women                               1/2 Avg.Risk  3.4    3.3                                   Avg.Risk  5.0    4.4                                2X Avg.Risk  9.6    7.1                                3X Avg.Risk 23.4   11.0   CBC With Diff/Platelet     Status: Abnormal   Collection Time: 08/31/22  9:54 AM  Result Value Ref Range   WBC 6.5 3.4 - 10.8  x10E3/uL   RBC 4.46 4.14 - 5.80 x10E6/uL   Hemoglobin 12.6 (L) 13.0 - 17.7 g/dL   Hematocrit 95.6 21.3 - 51.0 %   MCV 87 79 - 97 fL   MCH 28.3 26.6 - 33.0 pg   MCHC 32.4 31.5 - 35.7 g/dL   RDW 08.6 57.8 - 46.9 %   Platelets 153 150 - 450  x10E3/uL   Neutrophils 59 Not Estab. %   Lymphs 25 Not Estab. %   Monocytes 10 Not Estab. %   Eos 5 Not Estab. %   Basos 1 Not Estab. %   Neutrophils Absolute 3.9 1.4 - 7.0 x10E3/uL   Lymphocytes Absolute 1.6 0.7 - 3.1 x10E3/uL   Monocytes Absolute 0.7 0.1 - 0.9 x10E3/uL   EOS (ABSOLUTE) 0.4 0.0 - 0.4 x10E3/uL   Basophils Absolute 0.0 0.0 - 0.2 x10E3/uL   Immature Granulocytes 0 Not Estab. %   Immature Grans (Abs) 0.0 0.0 - 0.1 x10E3/uL  Comprehensive metabolic panel     Status: Abnormal   Collection Time: 08/31/22  9:54 AM  Result Value Ref Range   Glucose 117 (H) 70 - 99 mg/dL   BUN 13 8 - 27 mg/dL   Creatinine, Ser 1.06 (H) 0.76 - 1.27 mg/dL   eGFR 51 (L) >26 RS/WNI/6.27   BUN/Creatinine Ratio 10 10 - 24   Sodium 145 (H) 134 - 144 mmol/L   Potassium 4.6 3.5 - 5.2 mmol/L   Chloride 104 96 - 106 mmol/L   CO2 25 20 - 29 mmol/L   Calcium 8.7 8.6 - 10.2 mg/dL   Total Protein 6.3 6.0 - 8.5 g/dL   Albumin 3.9 3.7 - 4.7 g/dL   Globulin, Total 2.4 1.5 - 4.5 g/dL   Bilirubin Total 0.4 0.0 - 1.2 mg/dL   Alkaline Phosphatase 109 44 - 121 IU/L   AST 16 0 - 40 IU/L   ALT 7 0 - 44 IU/L      Assessment & Plan:  As per problem list and administer fluids.  Problem List Items Addressed This Visit       Cardiovascular and Mediastinum   Primary hypertension - Primary   Relevant Medications   amLODipine (NORVASC) 5 MG tablet   losartan (COZAAR) 100 MG tablet   Other Relevant Orders   CBC With Diff/Platelet     Endocrine   Hypothyroidism   Relevant Medications   levothyroxine (SYNTHROID) 150 MCG tablet   Other Relevant Orders   TSH     Genitourinary   AKI (acute kidney injury) (HCC)     Other   MDD (major depressive disorder),  recurrent episode, moderate (HCC)   Hypernatremia   Mixed hyperlipidemia   Relevant Medications   amLODipine (NORVASC) 5 MG tablet   losartan (COZAAR) 100 MG tablet   Other Relevant Orders   Comprehensive metabolic panel   Lipid panel   Other Visit Diagnoses     Gout, unspecified       Relevant Medications   allopurinol (ZYLOPRIM) 100 MG tablet   Essential (primary) hypertension       Relevant Medications   amLODipine (NORVASC) 5 MG tablet   losartan (COZAAR) 100 MG tablet       Return in about 3 months (around 12/05/2022) for awv with labs prior.   Total time spent: 20 minutes  Luna Fuse, MD  09/04/2022   This document may have been prepared by [facility] Voice Recognition software and as such may include unintentional dictation errors.

## 2022-09-04 NOTE — Progress Notes (Signed)
PROVIDER NOTE: Information contained herein reflects review and annotations entered in association with encounter. Interpretation of such information and data should be left to medically-trained personnel. Information provided to patient can be located elsewhere in the medical record under "Patient Instructions". Document created using STT-dictation technology, any transcriptional errors that may result from process are unintentional.    Patient: Chase Mendez  Service Category: E/M  Provider: Oswaldo Done, MD  DOB: [birth year]  DOS: 09/04/2022  Referring Provider: Sherron Monday, MD  MRN: [id number]  Specialty: Interventional Pain Management  PCP: Chase Monday, MD  Type: Established Patient  Setting: Ambulatory outpatient    Location: Office  Delivery: Face-to-face     HPI  [name], a 88 y.o. year old male, is here today because of his Chronic pain syndrome [G89.4]. Chase Mendez primary complain today is Hip Pain (left)  Pertinent problems: Chase Mendez has Spinal accessory neuropathy; Lower limb pain, inferior (L5) (Foot Drop) (Left); Chronic low back pain (2ry area of Pain) (Bilateral) (ML) (L>R) w/ sciatica (Left); Chronic lower extremity pain (1ry area of Pain) (Left); Failed back surgical syndrome; Foot drop (Left); Lumbosacral radiculopathy at L5 (Left); DDD (degenerative disc disease), lumbar; Chronic pain syndrome; Thoracic central spinal stenosis; Chronic musculoskeletal pain; Neurogenic pain; Presence of neurostimulator; Gout of left foot due to renal impairment; Disturbance of skin sensation; Cervicalgia; Cervical facet syndrome; Bilateral lower abdominal pain; Abnormal MRI, thoracic spine (02/12/2018); DDD (degenerative disc disease), cervical; Cervicogenic headache; Painful cervical range of motion; Impaired range of motion of cervical spine; and Spondylosis without myelopathy or radiculopathy, cervical region on their pertinent problem list. Pain Assessment: Severity  of Chronic pain is reported as a 4 /10. Location: Hip Left/down to left foot. Onset: More than a month ago. Quality: Aching, Burning, Stabbing. Timing: Constant. Modifying factor(s): meds, reclining. Vitals:  height is 6' (1.829 m) and weight is 198 lb (89.8 kg). His temporal temperature is 98.1 F (36.7 C). His blood pressure is 130/67 and his pulse is 59 (abnormal). His respiration is 18 and oxygen saturation is 98%.  BMI: Estimated body mass index is 26.85 kg/m as calculated from the following:   Height as of this encounter: 6' (1.829 m).   Weight as of this encounter: 198 lb (89.8 kg). Last encounter: 05/31/2022. Last procedure: 03/16/2022.  Reason for encounter: medication management.  The patient indicates doing well with the current medication regimen. No adverse reactions or side effects reported to the medications.  Since the patient's last visit to our office, he indicates having been hospitalized due to pneumonia.  As always, he comes in accompanied by his wife who monitors and controls his medications for him since he is legally blind.  Today he initially mentioned doing the radiofrequency ablation for his neck pain, but when I started to put in the orders for it, he then decided to hold on that.  He indicated that he would let us know if he wanted to proceed with that but for now he wanted to try to see if they would "clear by itself" (?).  Routine UDS ordered today.   RTCB: 12/06/2022   Pharmacotherapy Assessment  Analgesic: Oxycodone IR 10 mg, 1 tab PO q 6 hrs. (40 mg/day of oxycodone) MME/day: 60 mg/day.   Monitoring: Chase Mendez PMP: Chase Mendez reviewed during this encounter.       Pharmacotherapy: No side-effects or adverse reactions reported. Compliance: No problems identified. Effectiveness: Clinically acceptable.  Chase Mattes, RN  09/04/2022 11:32 AM  Sign  when Signing Visit Nursing Pain Medication Assessment:  Safety precautions to be maintained throughout the outpatient stay will  include: orient to surroundings, keep bed in low position, maintain call bell within reach at all times, provide assistance with transfer out of bed and ambulation.  Medication Inspection Compliance: Pill count conducted under aseptic conditions, in front of the patient. Neither the pills nor the bottle was removed from the patient's sight at any time. Once count was completed pills were immediately returned to the patient in their original bottle.  Medication #1: Oxycodone IR Pill/Patch Count:  11 of 120 pills remain Pill/Patch Appearance: Markings consistent with prescribed medication Bottle Appearance: Standard pharmacy container. Clearly labeled. Filled Date: 6 / 1 / 2024 Last Medication intake:   unknown  Medication #2: Oxycodone IR Pill/Patch Count:  12 of 120 pills remain Pill/Patch Appearance: Markings consistent with prescribed medication Bottle Appearance: Standard pharmacy container. Clearly labeled. Filled Date: 7 / 2 / 2024 Last Medication intake:  Today  Pt. Wife states after pt hospitalized 07/17/22 with pneumonia, they came home and refilled new Rx in July not realizing there were still pills left from June Rx.     No results found for: "CBDTHCR" No results found for: "D8THCCBX" No results found for: "D9THCCBX"  UDS:  Summary  Date Value Ref Range Status  08/03/2021 Note  Final    Comment:    ==================================================================== [employer] Select 13 (MW) ==================================================================== Test                             Result       Flag       Units  Drug Present and Declared for Prescription Verification   Oxycodone                      3770         EXPECTED   ng/mg creat   Oxymorphone                    1689         EXPECTED   ng/mg creat   Noroxycodone                   4295         EXPECTED   ng/mg creat   Noroxymorphone                 909          EXPECTED   ng/mg creat    Sources of oxycodone  are scheduled prescription medications.    Oxymorphone, noroxycodone, and noroxymorphone are expected    metabolites of oxycodone. Oxymorphone is also available as a    scheduled prescription medication.  ==================================================================== Test                      Result    Flag   Units      Ref Range   Creatinine              148              mg/dL      >=38 ==================================================================== Declared Medications:  The flagging and interpretation on this report are based on the  following declared medications.  Unexpected results may arise from  inaccuracies in the declared medications.   **Note: The testing scope of this panel includes these medications:   Oxycodone   **  Note: The testing scope of this panel does not include the  following reported medications:   Allopurinol (Zyloprim)  Amlodipine (Norvasc)  Aspirin  Atorvastatin (Lipitor)  Cyanocobalamin  Diphenhydramine (Benadryl)  Duloxetine (Cymbalta)  Gabapentin (Neurontin)  Isosorbide (Imdur)  Levothyroxine (Synthroid)  Losartan (Cozaar)  Ondansetron (Zofran)  Pantoprazole (Protonix)  Tamsulosin (Flomax)  Testosterone  Vitamin E ==================================================================== For clinical consultation, please call [phone]. ====================================================================       ROS  Constitutional: Denies any fever or chills Gastrointestinal: No reported hemesis, hematochezia, vomiting, or acute GI distress Musculoskeletal: Denies any acute onset joint swelling, redness, loss of ROM, or weakness Neurological: No reported episodes of acute onset apraxia, aphasia, dysarthria, agnosia, amnesia, paralysis, loss of coordination, or loss of consciousness  Medication Review  Oxycodone HCl, Testosterone, Vitamin D (Ergocalciferol), acetaminophen, allopurinol, amLODipine, aspirin, atorvastatin,  cyanocobalamin, diphenhydrAMINE, famotidine, fluticasone, gabapentin, isosorbide mononitrate, levothyroxine, losartan, mirabegron ER, naloxone, ondansetron, sertraline, tamsulosin, and vitamin E  History Review  Allergy: Mr. Zachry is allergic to doxycycline, ibuprofen, and sulfa antibiotics. Drug: Mr. Torbeck  reports current drug use. Drug: Oxycodone. Alcohol:  reports no history of alcohol use. Tobacco:  reports that he has been smoking cigars. He has never used smokeless tobacco. Social: Mr. Wark  reports that he has been smoking cigars. He has never used smokeless tobacco. He reports current drug use. Drug: Oxycodone. He reports that he does not drink alcohol. Medical:  has a past medical history of Acute encephalopathy (12/08/2014), Anxiety, ARF (acute renal failure) (HCC) (12/08/2014), Back pain, Benign neoplasm of large bowel, Capsulitis, Chronic back pain, Coronavirus infection, Depression, Diabetes mellitus without complication (HCC), Dysphagia, Exostosis, Foot drop, left, Frequent falls (02/2018), GERD (gastroesophageal reflux disease), Gout, Hypertension, Hypothyroidism, Insomnia, Low testosterone, Microscopic hematuria (2016), Myocardial infarction (HCC), Pneumonia (12/07/2014), Pressure ulcer (12/09/2014), Sepsis (HCC) (12/08/2014), and Thyroid disease. Surgical: Mr. Esquibel  has a past surgical history that includes Cholecystectomy; Esophagogastroduodenoscopy (egd) with propofol (N/A, 04/24/2017); Back surgery (1995, 1996); Eye surgery (Bilateral, 1983, 1985); Appendectomy; and Spinal cord stimulator insertion (N/A, 03/13/2018). Family: family history includes Diabetes in his father; Heart disease in his mother; Kidney cancer in his brother.  Laboratory Chemistry Profile   Renal Lab Results  Component Value Date   BUN 13 08/31/2022   CREATININE 1.35 (H) 08/31/2022   BCR 10 08/31/2022   GFRAA >60 01/28/2019   GFRNONAA >60 07/19/2022    Hepatic Lab Results  Component Value Date   AST  16 08/31/2022   ALT 7 08/31/2022   ALBUMIN 3.9 08/31/2022   ALKPHOS 109 08/31/2022   HCVAB <0.1 07/24/2018   LIPASE 37 08/21/2016    Electrolytes Lab Results  Component Value Date   NA 145 (H) 08/31/2022   K 4.6 08/31/2022   CL 104 08/31/2022   CALCIUM 8.7 08/31/2022   MG 2.2 07/18/2022   PHOS 2.6 07/19/2022    Bone Lab Results  Component Value Date   TESTOSTERONE 513 07/12/2022    Inflammation (CRP: Acute Phase) (ESR: Chronic Phase) Lab Results  Component Value Date   CRP 6.6 (H) 07/19/2022   LATICACIDVEN 1.7 07/17/2022         Note: Above Lab results reviewed.  Recent Imaging Review  US Venous Img Lower Bilateral (DVT) CLINICAL DATA:  82956 Swelling 21308  EXAM: BILATERAL LOWER EXTREMITY VENOUS DOPPLER ULTRASOUND  TECHNIQUE: Gray-scale sonography with graded compression, as well as color Doppler and duplex ultrasound were performed to evaluate the lower extremity deep venous systems from the level of the common femoral vein  and including the common femoral, femoral, profunda femoral, popliteal and calf veins including the posterior tibial, peroneal and gastrocnemius veins when visible. The superficial great saphenous vein was also interrogated. Spectral Doppler was utilized to evaluate flow at rest and with distal augmentation maneuvers in the common femoral, femoral and popliteal veins.  COMPARISON:  None Available.  FINDINGS: RIGHT LOWER EXTREMITY  Common Femoral Vein: No evidence of thrombus. Normal compressibility, respiratory phasicity and response to augmentation.  Saphenofemoral Junction: No evidence of thrombus. Normal compressibility and flow on color Doppler imaging.  Profunda Femoral Vein: No evidence of thrombus. Normal compressibility and flow on color Doppler imaging.  Femoral Vein: No evidence of thrombus. Normal compressibility, respiratory phasicity and response to augmentation.  Popliteal Vein: No evidence of thrombus. Normal  compressibility, respiratory phasicity and response to augmentation.  Calf Veins: Calf veins were poorly visualized on grayscale imaging, but were without evidence of thrombus on color Doppler evaluation  Superficial Great Saphenous Vein: No evidence of thrombus. Normal compressibility.  Venous Reflux:  None.  Other Findings:  None.  LEFT LOWER EXTREMITY  Common Femoral Vein: No evidence of thrombus. Normal compressibility, respiratory phasicity and response to augmentation.  Saphenofemoral Junction: No evidence of thrombus. Normal compressibility and flow on color Doppler imaging.  Profunda Femoral Vein: No evidence of thrombus. Normal compressibility and flow on color Doppler imaging.  Femoral Vein: No evidence of thrombus. Normal compressibility, respiratory phasicity and response to augmentation.  Popliteal Vein: No evidence of thrombus. Normal compressibility, respiratory phasicity and response to augmentation.  Calf Veins: Calf veins were poorly visualized on grayscale imaging, but were without evidence of thrombus on color Doppler evaluation  Superficial Great Saphenous Vein: No evidence of thrombus. Normal compressibility.  Venous Reflux:  None.  Other Findings: Soft tissue swelling in the left-greater-than-right lower extremity.  IMPRESSION: No evidence of deep venous thrombosis in either lower extremity.  Electronically Signed   By: Lorenza Cambridge M.D.   On: 07/17/2022 15:43 CT Angio Chest PE W/Cm &/Or Wo Cm CLINICAL DATA:  Pulmonary embolism suspected, high probability  EXAM: CT ANGIOGRAPHY CHEST WITH CONTRAST  TECHNIQUE: Multidetector CT imaging of the chest was performed using the standard protocol during bolus administration of intravenous contrast. Multiplanar CT image reconstructions and MIPs were obtained to evaluate the vascular anatomy.  RADIATION DOSE REDUCTION: This exam was performed according to the departmental dose-optimization  program which includes automated exposure control, adjustment of the mA and/or kV according to patient size and/or use of iterative reconstruction technique.  CONTRAST:  50mL OMNIPAQUE IOHEXOL 350 MG/ML SOLN  COMPARISON:  None Available.  FINDINGS: Cardiovascular: Conventional 3 vessel arch anatomy. Atherosclerotic calcifications throughout the aorta and along the coronary arteries. Mild cardiomegaly. The pulmonary arteries are well opacified to the proximal segmental level. No evidence of acute pulmonary embolus. No pericardial effusion.  Mediastinum/Nodes: Unremarkable CT appearance of the thyroid gland. No suspicious mediastinal or hilar adenopathy. No soft tissue mediastinal mass. Small hiatal hernia.  Lungs/Pleura: Multifocal ground-glass attenuation airspace opacities and small nodules throughout the right upper lobe in a peribronchovascular distribution. Diffuse right lower lobe bronchial wall thickening with a combination of peribronchovascular patchy airspace opacities and volume loss. Similar but less impressive findings in the left lower lobe. Mild background of centrilobular pulmonary emphysema. No pleural effusion or pneumothorax.  Upper Abdomen: Submucosal edema at the hepatic flexure and proximal ascending colon. Inflammatory stranding is present in the pericolonic fat. The liver demonstrates a cirrhotic morphology with faint nodularity and hypertrophy of the left  lobe relative to the right. The gallbladder is surgically absent.  Musculoskeletal: No chest wall abnormality. No acute or significant osseous findings. Epidural spinal stimulator in place.  Review of the MIP images confirms the above findings.  IMPRESSION: 1. CT findings are most consistent with multi lobar pneumonia involving the right upper, right lower and, to a lesser extent, the left lower lobes. 2. Negative for acute pulmonary embolus. 3. Additionally, the visualized hepatic flexure and  proximal transverse colon are abnormal with diffuse wall thickening and pericolonic inflammatory stranding. Findings suggest active infectious/inflammatory colitis. 4. Aortic and coronary artery atherosclerotic vascular calcifications. 5. Mild centrilobular pulmonary emphysema. 6. Small hiatal hernia.  Aortic Atherosclerosis (ICD10-I70.0) and Emphysema (ICD10-J43.9).  Electronically Signed   By: Malachy Moan M.D.   On: 07/17/2022 08:19 DG Chest Port 1 View CLINICAL DATA:  Questionable sepsis  EXAM: PORTABLE CHEST 1 VIEW  COMPARISON:  01/27/2019  FINDINGS: Low volume chest with interstitial crowding at the bases. No edema, effusion, or pneumothorax. Normal heart size and mediastinal contours for technique. Spinal cord stimulator the lower thoracic level.  IMPRESSION: Low volume chest with atelectatic type density at the bases. Early bronchopneumonia is not excluded.  Electronically Signed   By: Tiburcio Pea M.D.   On: 07/17/2022 05:50 Note: Reviewed        Physical Exam  General appearance: Well nourished, well developed, and well hydrated. In no apparent acute distress Mental status: Alert, oriented x 3 (person, place, & time)       Respiratory: No evidence of acute respiratory distress Eyes: PERLA Vitals: BP 130/67   Pulse (!) 59   Temp 98.1 F (36.7 C) (Temporal)   Resp 18   Ht 6' (1.829 m)   Wt 198 lb (89.8 kg)   SpO2 98%   BMI 26.85 kg/m  BMI: Estimated body mass index is 26.85 kg/m as calculated from the following:   Height as of this encounter: 6' (1.829 m).   Weight as of this encounter: 198 lb (89.8 kg). Ideal: Ideal body weight: 77.6 kg (171 lb 1.2 oz) Adjusted ideal body weight: 82.5 kg (181 lb 13.5 oz)  Assessment   Diagnosis Status  1. Chronic pain syndrome   2. Chronic lower extremity pain (1ry area of Pain) (Left)   3. Chronic low back pain (2ry area of Pain) (Bilateral) (ML) (L>R) w/ sciatica (Left)   4. DDD (degenerative disc  disease), lumbar   5. Lumbosacral radiculopathy at L5 (Left)   6. Failed back surgical syndrome   7. Pharmacologic therapy   8. Chronic use of opiate for therapeutic purpose   9. Encounter for medication management   10. Encounter for chronic pain management    Controlled Controlled Controlled   Updated Problems: No problems updated.  Plan of Care  Problem-specific:  No problem-specific Assessment & Plan notes found for this encounter.  Mr. CHRISSHAWN FOREE has a current medication list which includes the following long-term medication(s): allopurinol, atorvastatin, famotidine, gabapentin, isosorbide mononitrate, levothyroxine, losartan, mirabegron er, [START ON 09/07/2022] oxycodone hcl, [START ON 10/07/2022] oxycodone hcl, [START ON 11/06/2022] oxycodone hcl, sertraline, testosterone, and [DISCONTINUED] diphenhydramine.  Pharmacotherapy (Medications Ordered): Meds ordered this encounter  Medications   Oxycodone HCl 10 MG TABS    Sig: Take 1 tablet (10 mg total) by mouth every 6 (six) hours as needed. Must last 30 days.    Dispense:  120 tablet    Refill:  0    DO NOT: delete (not duplicate); no partial-fill (will deny  script to complete), no refill request (F/U required). DISPENSE: 1 day early if closed on fill date. WARN: No CNS-depressants within 8 hrs of med.   Oxycodone HCl 10 MG TABS    Sig: Take 1 tablet (10 mg total) by mouth every 6 (six) hours as needed. Must last 30 days.    Dispense:  120 tablet    Refill:  0    DO NOT: delete (not duplicate); no partial-fill (will deny script to complete), no refill request (F/U required). DISPENSE: 1 day early if closed on fill date. WARN: No CNS-depressants within 8 hrs of med.   Oxycodone HCl 10 MG TABS    Sig: Take 1 tablet (10 mg total) by mouth every 6 (six) hours as needed. Must last 30 days.    Dispense:  120 tablet    Refill:  0    DO NOT: delete (not duplicate); no partial-fill (will deny script to complete), no refill request  (F/U required). DISPENSE: 1 day early if closed on fill date. WARN: No CNS-depressants within 8 hrs of med.   Orders:  Orders Placed This Encounter  Procedures   Tox[employer] Select 13 (MW), Urine    Volume: 30 ml(s). Minimum 3 ml of urine is needed. Document temperature of fresh sample. Indications: Long term (current) use of opiate analgesic (Z61.096)    Order Specific Question:   Release to patient    Answer:   Immediate   Nursing Instructions:    1). STAT: UDS required today. 2). Make sure to document all opioids and benzodiazepines taken, including time of last intake. 3). If order is entered on a procedure day, make sure sample is obtained before any medications are administered.   Follow-up plan:   Return in about 3 months (around 12/06/2022) for Eval-day (M,W), (F2F), (MM).      Interventional Therapies  Risk  Complexity Considerations:   WNL   Planned  Pending:      Under consideration:   Therapeutic bilateral cervical facet MB RFA #1    Completed:   Diagnostic/therapeutic bilateral cervical facet MBB x2 (03/16/2022) (1st:100/100/100/100) (2nd: 100/100/100/20) Diagnostic/therapeutic left caudal ESI + epidurogram x1 (11/17/2021) (100/100/25/25)    Therapeutic  Palliative (PRN) options:   Therapeutic/palliative left L4 TFESI #2  Therapeutic/palliative left L5 TFESI #2  Bilateral spinal cord stimulator trial (Mendez - 01/17/2018)  Permanent bilateral spinal cord stimulator implant by Dr. Marcell Barlow (neurosurgery) (Mendez - 03/13/2018)   Pharmacotherapy  Nonopioids transferred 01/12/2020: Gabapentin       Recent Visits No visits were found meeting these conditions. Showing recent visits within past 90 days and meeting all other requirements Today's Visits Date Type Provider Dept  09/04/22 Office Visit Delano Metz, MD [facility]  Showing today's visits and meeting all other requirements Future Appointments No visits were found meeting these  conditions. Showing future appointments within next 90 days and meeting all other requirements  I discussed the assessment and treatment plan with the patient. The patient was provided an opportunity to ask questions and all were answered. The patient agreed with the plan and demonstrated an understanding of the instructions.  Patient advised to call back or seek an in-person evaluation if the symptoms or condition worsens.  Duration of encounter: 30 minutes.  Total time on encounter, as per AMA guidelines included both the face-to-face and non-face-to-face time personally spent by the physician and/or other qualified health care professional(s) on the day of the encounter (includes time in activities that require the physician or other  qualified health care professional and does not include time in activities normally performed by clinical staff). Physician's time may include the following activities when performed: Preparing to see the patient (e.g., pre-charting review of records, searching for previously ordered imaging, lab work, and nerve conduction tests) Review of prior analgesic pharmacotherapies. Reviewing PMP Interpreting ordered tests (e.g., lab work, imaging, nerve conduction tests) Performing post-procedure evaluations, including interpretation of diagnostic procedures Obtaining and/or reviewing separately obtained history Performing a medically appropriate examination and/or evaluation Counseling and educating the patient/family/caregiver Ordering medications, tests, or procedures Referring and communicating with other health care professionals (when not separately reported) Documenting clinical information in the electronic or other health record Independently interpreting results (not separately reported) and communicating results to the patient/ family/caregiver Care coordination (not separately reported)  Note by: Chase Done, MD Date: 09/04/2022; Time: 12:12 PM

## 2022-09-05 ENCOUNTER — Ambulatory Visit: Payer: Medicare HMO

## 2022-09-05 DIAGNOSIS — Z79891 Long term (current) use of opiate analgesic: Secondary | ICD-10-CM | POA: Diagnosis not present

## 2022-09-05 DIAGNOSIS — G894 Chronic pain syndrome: Secondary | ICD-10-CM | POA: Diagnosis not present

## 2022-09-05 DIAGNOSIS — Z79899 Other long term (current) drug therapy: Secondary | ICD-10-CM | POA: Diagnosis not present

## 2022-09-07 ENCOUNTER — Ambulatory Visit (INDEPENDENT_AMBULATORY_CARE_PROVIDER_SITE_OTHER): Payer: PRIVATE HEALTH INSURANCE | Admitting: Psychiatry

## 2022-09-07 ENCOUNTER — Encounter: Payer: Self-pay | Admitting: Psychiatry

## 2022-09-07 VITALS — BP 105/58 | HR 74 | Temp 98.5°F | Ht 72.0 in | Wt 186.4 lb

## 2022-09-07 DIAGNOSIS — R419 Unspecified symptoms and signs involving cognitive functions and awareness: Secondary | ICD-10-CM | POA: Diagnosis not present

## 2022-09-07 DIAGNOSIS — F331 Major depressive disorder, recurrent, moderate: Secondary | ICD-10-CM | POA: Diagnosis not present

## 2022-09-07 DIAGNOSIS — F419 Anxiety disorder, unspecified: Secondary | ICD-10-CM

## 2022-09-07 MED ORDER — SERTRALINE HCL 50 MG PO TABS
25.0000 mg | ORAL_TABLET | Freq: Two times a day (BID) | ORAL | 1 refills | Status: DC
Start: 2022-09-07 — End: 2022-10-13

## 2022-09-07 MED ORDER — BUPROPION HCL 75 MG PO TABS
75.0000 mg | ORAL_TABLET | Freq: Every morning | ORAL | 1 refills | Status: DC
Start: 2022-09-07 — End: 2022-10-25

## 2022-09-07 MED ORDER — HYDROXYZINE HCL 25 MG PO TABS
12.5000 mg | ORAL_TABLET | Freq: Every day | ORAL | 1 refills | Status: DC | PRN
Start: 2022-09-07 — End: 2023-01-24

## 2022-09-07 NOTE — Progress Notes (Signed)
BH MD OP Progress Note  09/07/2022 2:52 PM Chase Mendez  MRN:  [id number]  Chief Complaint:  Chief Complaint  Patient presents with   Follow-up   Depression   Anxiety   Medication Refill   HPI: Chase Mendez is an 88 year old Caucasian male,veteran, retired from [employer], lives in [place] with his wife, has a history of MDD, anxiety, neurocognitive disorder, chronic pain, foot drop left sided, hypothyroidism, hypertension, history of frequent falls, recent pneumonia with hospital admission, presented for follow-up appointment.  This being patient's first visit with this provider.  Patient used to follow-up with Dr. Vanetta Shawl at this practice.  Patient today reports he has a lot going on in his life.  Patient reports his ex-wife passed away 2 weeks ago.  Patient reports he hence has been grieving.  Patient also is worried about his stepdaughter who has medical problems, worried about his son who is estranged.  Patient also is worried about his own health issues including his problems with vision, hearing.  He reports he has a lot of intrusive memories about his past.  He misses his life in [place], his friends and the things that he used to do.  He has multiple medical problems including chronic pain, vision impairment, legally blind, hearing impairment and hence is unable to do a lot on a day-to-day basis.  He hence currently stays depressed, has anhedonia, low motivation, low energy.  Patient reports he started taking the Zoloft in the morning as prescribed by his previous provider however he started hallucinating on the lower dosage.  He hence started splitting the dose into half in the morning and half at night and that has been working well.  The Zoloft does help him to sleep also.  Patient denies any suicidality, homicidality or perceptual disturbances.  Collateral information obtained from spouse-[name] who reported that patient stays depressed and is agreeable to trial of a  medication to augment the Zoloft.  Discussed Wellbutrin, patient as well as wife agrees with plan.  I have reviewed notes per Dr. Fatima Blank recent one being on 05/09/2022-patient carries the diagnosis of MDD, neurocognitive disorder.  Visit Diagnosis:    ICD-10-CM   1. MDD (major depressive disorder), recurrent episode, moderate (HCC)  F33.1 buPROPion (WELLBUTRIN) 75 MG tablet    sertraline (ZOLOFT) 50 MG tablet    hydrOXYzine (ATARAX) 25 MG tablet    2. Anxiety disorder, unspecified type  F41.9 sertraline (ZOLOFT) 50 MG tablet    hydrOXYzine (ATARAX) 25 MG tablet    3. Neurocognitive disorder  R41.9    likely mild      Past Psychiatric History: I have reviewed medical records-reviewed past psychiatric history from progress note by Dr. Vanetta Shawl 12/12/2021-past trilas of medications multiple including Vraylar, Remeron, Celexa, risperidone, Cymbalta  Past Medical History:  Past Medical History:  Diagnosis Date   Acute encephalopathy 12/08/2014   Anxiety    ARF (acute renal failure) (HCC) 12/08/2014   Back pain    Benign neoplasm of large bowel    Capsulitis    fractured displaced metatarsal with capsulitis   Chronic back pain    Coronavirus infection    Depression    Diabetes mellitus without complication (HCC)    no medications currently   Dysphagia    Exostosis    painful, right hallux   Foot drop, left    wears a brace   Frequent falls 02/2018   poor balance, foot drop   GERD (gastroesophageal reflux disease)  Gout    Hypertension    Hypothyroidism    Insomnia    Low testosterone    Microscopic hematuria 2016   Myocardial infarction [facility])    patient unaware when it happened years ago.     Pneumonia 12/07/2014   Pressure ulcer 12/09/2014   Sepsis (HCC) 12/08/2014   Thyroid disease     Past Surgical History:  Procedure Laterality Date   APPENDECTOMY     BACK SURGERY  1995, 1996   x 2   CHOLECYSTECTOMY     ESOPHAGOGASTRODUODENOSCOPY (EGD) WITH PROPOFOL N/A  04/24/2017   Procedure: ESOPHAGOGASTRODUODENOSCOPY (EGD) WITH PROPOFOL;  Surgeon: Scot Jun, MD;  Location: [facility] ENDOSCOPY;  Service: Endoscopy;  Laterality: N/A;   EYE SURGERY Bilateral 1983, 1985   cataract extractions   SPINAL CORD STIMULATOR INSERTION N/A 03/13/2018   Procedure: SPINAL CORD STIMULATOR INSERTION;  Surgeon: Venetia Night, MD;  Location: [facility] ORS;  Service: Neurosurgery;  Laterality: N/A;    Family Psychiatric History: Unknown will need to explore this in future sessions.  Family History:  Family History  Problem Relation Age of Onset   Heart disease Mother    Diabetes Father    Kidney cancer Brother     Social History: As noted below. Social History   Socioeconomic History   Marital status: Married    Spouse name: dorothy   Number of children: 2   Years of education: Not on file   Highest education level: Some college, no degree  Occupational History   Occupation: maintenance / repair    Comment: disabled  Tobacco Use   Smoking status: Every Day    Types: Cigars   Smokeless tobacco: Never   Tobacco comments:    unable to give cessation materials due to webex visit   Vaping Use   Vaping status: Never Used  Substance and Sexual Activity   Alcohol use: No   Drug use: Yes    Types: Oxycodone   Sexual activity: Not Currently  Other Topics Concern   Not on file  Social History Narrative   Not on file   Social Determinants of Health   Financial Resource Strain: Not on file  Food Insecurity: No Food Insecurity (07/17/2022)   Hunger Vital Sign    Worried About Running Out of Food in the Last Year: Never true    Ran Out of Food in the Last Year: Never true  Transportation Needs: No Transportation Needs (07/17/2022)   PRAPARE - Administrator, Civil Service (Medical): No    Lack of Transportation (Non-Medical): No  Physical Activity: Not on file  Stress: Not on file  Social Connections: Not on file    Allergies:  Allergies   Allergen Reactions   Doxycycline Nausea Only   Ibuprofen Nausea Only   Sulfa Antibiotics Nausea Only    Metabolic Disorder Labs: Lab Results  Component Value Date   HGBA1C 5.6 07/18/2022   MPG 114.02 07/18/2022   No results found for: "PROLACTIN" Lab Results  Component Value Date   CHOL 94 (L) 08/31/2022   TRIG 71 08/31/2022   HDL 34 (L) 08/31/2022   CHOLHDL 2.8 08/31/2022   VLDL 23 12/08/2014   LDLCALC 45 08/31/2022   LDLCALC 39 05/31/2022   Lab Results  Component Value Date   TSH 4.510 (H) 08/31/2022   TSH 1.890 05/31/2022    Therapeutic Level Labs: No results found for: "LITHIUM" No results found for: "VALPROATE" No results found for: "CBMZ"  Current Medications: Current  Outpatient Medications  Medication Sig Dispense Refill   acetaminophen (TYLENOL) 500 MG tablet Take 500-1,000 mg by mouth 2 (two) times daily as needed for mild pain.     allopurinol (ZYLOPRIM) 100 MG tablet Take 1 tablet (100 mg total) by mouth every other day. 45 tablet 1   amLODipine (NORVASC) 5 MG tablet Take 5 mg by mouth daily.     aspirin 81 MG tablet Take 81 mg by mouth daily.     atorvastatin (LIPITOR) 10 MG tablet TAKE 1 TABLET BY MOUTH EVERYDAY AT BEDTIME 90 tablet 1   buPROPion (WELLBUTRIN) 75 MG tablet Take 1 tablet (75 mg total) by mouth in the morning. 30 tablet 1   famotidine (PEPCID) 20 MG tablet Take 1 tablet by mouth 2 (two) times daily.     fluticasone (FLONASE) 50 MCG/ACT nasal spray Place 1-2 sprays into both nostrils daily as needed for allergies or rhinitis.     gabapentin (NEURONTIN) 600 MG tablet TAKE 1 TABLET BY MOUTH TWICE DAILY. 180 tablet 0   hydrOXYzine (ATARAX) 25 MG tablet Take 0.5-1 tablets (12.5-25 mg total) by mouth daily as needed for anxiety. USE ONLY FOR SEVERE ANXIETY ATTACKS, PLEASE LIMIT USE 30 tablet 1   isosorbide mononitrate (IMDUR) 30 MG 24 hr tablet Take 1 tablet (30 mg total) by mouth daily. 90 tablet 0   levothyroxine (SYNTHROID) 150 MCG tablet Take  1 tablet (150 mcg total) by mouth daily. 90 tablet 1   losartan (COZAAR) 100 MG tablet Take 1 tablet (100 mg total) by mouth daily. 90 tablet 1   mirabegron ER (MYRBETRIQ) 50 MG TB24 tablet Take 1 tablet (50 mg total) by mouth daily. (Patient taking differently: Take 25 mg by mouth daily.) 30 tablet 11   naloxone (NARCAN) nasal spray 4 mg/0.1 mL Place 1 spray into the nose as needed for up to 365 doses (for opioid-induced respiratory depresssion). In case of emergency (overdose), spray once into each nostril. If no response within 3 minutes, repeat application and call 911. 1 each 0   ondansetron (ZOFRAN-ODT) 4 MG disintegrating tablet TAKE 1 TABLET BY MOUTH THREE TIMES A DAY AS NEEDED FOR NAUSEA 90 tablet 2   Oxycodone HCl 10 MG TABS Take 1 tablet (10 mg total) by mouth every 6 (six) hours as needed. Must last 30 days. 120 tablet 0   [START ON 10/07/2022] Oxycodone HCl 10 MG TABS Take 1 tablet (10 mg total) by mouth every 6 (six) hours as needed. Must last 30 days. 120 tablet 0   [START ON 11/06/2022] Oxycodone HCl 10 MG TABS Take 1 tablet (10 mg total) by mouth every 6 (six) hours as needed. Must last 30 days. 120 tablet 0   tamsulosin (FLOMAX) 0.4 MG CAPS capsule Take 0.4 mg by mouth daily.     Testosterone 40.5 MG/2.5GM (1.62%) GEL Place 2 Pump onto the skin 3 (three) times a week.     vitamin B-12 (CYANOCOBALAMIN) 1000 MCG tablet Take 1,000 mcg by mouth daily.     Vitamin D, Ergocalciferol, (DRISDOL) 1.25 MG (50000 UNIT) CAPS capsule TAKE 1 CAPSULE BY MOUTH ONE TIME PER WEEK 12 capsule 3   vitamin E 400 UNIT capsule Take 400 Units by mouth daily.     sertraline (ZOLOFT) 50 MG tablet Take 0.5 tablets (25 mg total) by mouth 2 (two) times daily. 30 tablet 1   No current facility-administered medications for this visit.     Musculoskeletal: Strength & Muscle Tone: decreased Gait & Station:  Uses walker Patient leans: Front  Psychiatric Specialty Exam: Review of Systems   Psychiatric/Behavioral:  Positive for decreased concentration and dysphoric mood. The patient is nervous/anxious.     Blood pressure (!) 105/58, pulse 74, temperature 98.5 F (36.9 C), temperature source Skin, height 6' (1.829 m), weight 186 lb 6.4 oz (84.6 kg).Body mass index is 25.28 kg/m.  General Appearance: Casual  Eye Contact:  Fair  Speech:  Clear and Coherent delayed at times  although not sure if this is also due to his hearing impairment  Volume:  Decreased  Mood:  Anxious and Depressed  Affect:  Congruent  Thought Process:  Goal Directed and Descriptions of Associations: Intact  Orientation:  Other:  Self situation, was able to give the year as 2024, but not the date or month  Thought Content: Logical   Suicidal Thoughts:  No  Homicidal Thoughts:  No  Memory:  Immediate;   Fair Recent;   limited Remote;   limited  Judgement:  Fair  Insight:  Fair  Psychomotor Activity:  Decreased  Concentration:  Concentration: limited and Attention Span: limited  Recall:  [employer] of Knowledge: Fair  Language: Fair  Akathisia:  No  Handed:  Right  AIMS (if indicated): not done  Assets:  Manufacturing systems engineer Desire for Improvement Housing Social Support  ADL's:  Intact  Cognition: WNL  Sleep:  Fair   Screenings: GAD-7    Garment/textile technologist Visit from 09/07/2022 in [facility] Counselor from 05/29/2022 in [facility] Office Visit from 05/09/2022 in [facility] Counselor from 05/08/2022 in [facility] Counselor from 04/11/2022 in [facility]  Total GAD-7 Score 11 18 13 12 12       PHQ2-9    Flowsheet Row Office Visit from 09/07/2022 in [facility] Office Visit from 05/31/2022 in [facility] Counselor from 05/29/2022 in [facility] Office Visit from 05/09/2022 in [facility] Counselor from 05/08/2022 in [facility]  PHQ-2 Total Score 6 0 6 6 6   PHQ-9 Total Score 14 -- 19 20 16       Flowsheet Row Office Visit from 09/07/2022 in [facility] ED to Hosp-Admission (Discharged) from 07/17/2022 in [facility] 1C MEDICAL TELEMETRY Counselor from 05/29/2022 in [facility]  C-SSRS RISK CATEGORY No Risk No Risk No Risk        Assessment and Plan: REMINGTON HIGHBAUGH is an 88 year old Caucasian male with history of MDD, neurocognitive disorder, multiple medical problems presented for follow-up appointment.  Patient with continued depression symptoms, multiple psychosocial stressors as noted above, side effects to sertraline, will benefit from the following medication changes.  Plan as noted below.  Plan MDD-unstable Continue sertraline in divided dosage of 25 mg twice daily.  Patient developed hallucination on sertraline 50 mg when he took it all at once daily.  This regimen is working.  Will not increase the dosage due to previous side effect of hallucination. Will start Wellbutrin 75 mg p.o. daily in the morning.  Provided education about medications including the effect on mood, irritability, anxiety. Patient to continue CBT with Ms. Primus Bravo.   Anxiety disorder unspecified-rule out GAD-unstable Start hydroxyzine low dosage of 12.5-25 mg daily as needed for  severe anxiety only.  Patient advised to limit use given multiple side effects including memory changes, falls risk. Patient to continue sertraline 25 mg p.o. twice daily Patient to continue psychotherapy sessions, encouraged to have more frequent sessions.  I have communicated with staff to schedule this  patient for a sooner appointment.  Neurocognitive disorder-likely mild-unstable Patient currently has support at home.  He will benefit from neuropsychological testing/neurological evaluation as needed.  Patient to continue to follow-up with his provider.  If currently does not have a neurologist at the [place], will benefit from the same.  I have reviewed TSH labs dated 08/31/2022-elevated at 4.510-patient currently has a standing order for TSH repeat will benefit from repeating it.  Since thyroid abnormalities could also cause mood symptoms.  Patient to continue to follow up with primary care provider for the same.  Collaboration of Care: Collaboration of Care: Other I have reviewed notes from previous provider Dr. Theda Sers noted 12/12/2021-05/09/2022 I have obtained collateral information from wife as noted above.  Patient/Guardian was advised Release of Information must be obtained prior to any record release in order to collaborate their care with an outside provider. Patient/Guardian was advised if they have not already done so to contact the registration department to sign all necessary forms in order for [place] to release information regarding their care.   Consent: Patient/Guardian gives verbal consent for treatment and assignment of benefits for services provided during this visit. Patient/Guardian expressed understanding and agreed to proceed.   I have spent atleast 60 minutes face to face with patient today which includes the time spent for preparing to see the patient ( e.g., review of test, records ), obtaining and to review and separately obtained history from chart , previous provider as well as family since patient with cognitive changes, hearing and vision loss, ordering medications and test ,psychoeducation and supportive psychotherapy as well as documenting clinical information in electronic health record,interpreting and communication of test results   This note was generated in part or  whole with voice recognition software. Voice recognition is usually quite accurate but there are transcription errors that can and very often do occur. I apologize for any typographical errors that were not detected and corrected.    Jomarie Longs, MD 09/08/2022, 7:33 AM

## 2022-09-07 NOTE — Patient Instructions (Signed)
Hydroxyzine Capsules or Tablets What is this medication? HYDROXYZINE (hye DROX i zeen) treats the symptoms of allergies and allergic reactions. It may also be used to treat anxiety or cause drowsiness before a procedure. It works by blocking histamine, a substance released by the body during an allergic reaction. It belongs to a group of medications called antihistamines. This medicine may be used for other purposes; ask your health care provider or pharmacist if you have questions. COMMON BRAND NAME(S): ANX, Atarax, Rezine, Vistaril What should I tell my care team before I take this medication? They need to know if you have any of these conditions: Glaucoma Heart disease Irregular heartbeat or rhythm Kidney disease Liver disease Lung or breathing disease, such as asthma Stomach or intestine problems Thyroid disease Trouble passing urine An unusual or allergic reaction to hydroxyzine, other medications, foods, dyes or preservatives Pregnant or trying to get pregnant Breastfeeding How should I use this medication? Take this medication by mouth with a full glass of water. Take it as directed on the prescription label at the same time every day. You can take it with or without food. If it upsets your stomach, take it with food. Talk to your care team about the use of this medication in children. While it may be prescribed for children as young as 6 years for selected conditions, precautions do apply. People 65 years and older may have a stronger reaction and need a smaller dose. Overdosage: If you think you have taken too much of this medicine contact a poison control center or emergency room at once. NOTE: This medicine is only for you. Do not share this medicine with others. What if I miss a dose? If you miss a dose, take it as soon as you can. If it is almost time for your next dose, take only that dose. Do not take double or extra doses. What may interact with this medication? Do not  take this medication with any of the following: Cisapride Dronedarone Pimozide Thioridazine This medication may also interact with the following: Alcohol Antihistamines for allergy, cough, and cold Atropine Barbiturate medications for sleep or seizures, such as phenobarbital Certain antibiotics, such as erythromycin or clarithromycin Certain medications for anxiety or sleep Certain medications for bladder problems, such as oxybutynin or tolterodine Certain medications for irregular heartbeat Certain medications for mental health conditions Certain medications for Parkinson disease, such as benztropine, trihexyphenidyl Certain medications for seizures, such as phenobarbital or primidone Certain medications for stomach problems, such as dicyclomine or hyoscyamine Certain medications for travel sickness, such as scopolamine Ipratropium Opioid medications for pain Other medications that cause heart rhythm changes, such as dofetilide This list may not describe all possible interactions. Give your health care provider a list of all the medicines, herbs, non-prescription drugs, or dietary supplements you use. Also tell them if you smoke, drink alcohol, or use illegal drugs. Some items may interact with your medicine. What should I watch for while using this medication? Visit your care team for regular checks on your progress. Tell your care team if your symptoms do not start to get better or if they get worse. This medication may affect your coordination, reaction time, or judgment. Do not drive or operate machinery until you know how this medication affects you. Sit up or stand slowly to reduce the risk of dizzy or fainting spells. Drinking alcohol with this medication can increase the risk of these side effects. Your mouth may get dry. Chewing sugarless gum or sucking hard candy  and drinking plenty of water may help. Contact your care team if the problem does not go away or is severe. This  medication may cause dry eyes and blurred vision. If you wear contact lenses, you may feel some discomfort. Lubricating eye drops may help. See your care team if the problem does not go away or is severe. If you are receiving skin tests for allergies, tell your care team you are taking this medication. What side effects may I notice from receiving this medication? Side effects that you should report to your care team as soon as possible: Allergic reactions--skin rash, itching, hives, swelling of the face, lips, tongue, or throat Heart rhythm changes--fast or irregular heartbeat, dizziness, feeling faint or lightheaded, chest pain, trouble breathing Side effects that usually do not require medical attention (report to your care team if they continue or are bothersome): Confusion Drowsiness Dry mouth Hallucinations Headache This list may not describe all possible side effects. Call your doctor for medical advice about side effects. You may report side effects to FDA at 1-800-FDA-1088. Where should I keep my medication? Keep out of the reach of children and pets. Store at room temperature between 15 and 30 degrees C (59 and 86 degrees F). Keep container tightly closed. Throw away any unused medication after the expiration date. NOTE: This sheet is a summary. It may not cover all possible information. If you have questions about this medicine, talk to your doctor, pharmacist, or health care provider.  2024 Elsevier/Gold Standard (2021-09-02 00:00:00) Bupropion Tablets (Depression/Mood Disorders) What is this medication? BUPROPION (byoo PROE pee on) treats depression. It increases norepinephrine and dopamine in the brain, hormones that help regulate mood. It belongs to a group of medications called NDRIs. This medicine may be used for other purposes; ask your health care provider or pharmacist if you have questions. COMMON BRAND NAME(S): Wellbutrin What should I tell my care team before I take  this medication? They need to know if you have any of these conditions: An eating disorder, such as anorexia or bulimia Bipolar disorder or psychosis Diabetes or high blood sugar, treated with medication Glaucoma Head injury or brain tumor Heart disease, previous heart attack, or irregular heart beat High blood pressure Kidney disease Liver disease Seizures Suicidal thoughts, plans, or attempt by you or a family member Tourette syndrome Weight loss An unusual or allergic reaction to bupropion, other medications, foods, dyes, or preservatives Pregnant or trying to become pregnant Breastfeeding How should I use this medication? Take this medication by mouth with a glass of water. Follow the directions on the prescription label. You can take it with or without food. If it upsets your stomach, take it with food. Take your medication at regular intervals. Do not take your medication more often than directed. Do not stop taking this medication suddenly except upon the advice of your care team. Stopping this medication too quickly may cause serious side effects or your condition may worsen. A special MedGuide will be given to you by the pharmacist with each prescription and refill. Be sure to read this information carefully each time. Talk to your care team regarding the use of this medication in children. Special care may be needed. Overdosage: If you think you have taken too much of this medicine contact a poison control center or emergency room at once. NOTE: This medicine is only for you. Do not share this medicine with others. What if I miss a dose? If you miss a dose, take it as  soon as you can. If it is less than four hours to your next dose, take only that dose and skip the missed dose. Do not take double or extra doses. What may interact with this medication? Do not take this medication with any of the following: Linezolid MAOIs, such as Azilect, Carbex, Eldepryl, Marplan, Nardil, and  Parnate Methylene blue (injected into a vein) Other medications that contain bupropion, such as Zyban This medication may also interact with the following: Alcohol Certain medications for anxiety or sleep Certain medications for blood pressure, such as metoprolol, propranolol Certain medications for HIV or AIDS, such as efavirenz, lopinavir, nelfinavir, ritonavir Certain medications for irregular heartbeat, such as propafenone, flecainide Certain medications for mental health conditions Certain medications for Parkinson disease, such as amantadine, levodopa Certain medications for seizures, such as carbamazepine, phenytoin, phenobarbital Cimetidine Clopidogrel Cyclophosphamide Digoxin Furazolidone Isoniazid Nicotine Orphenadrine Procarbazine Steroid medications, such as prednisone or cortisone Stimulant medications for attention disorders, weight loss, or to stay awake Tamoxifen Theophylline Thiotepa Ticlopidine Tramadol Warfarin This list may not describe all possible interactions. Give your health care provider a list of all the medicines, herbs, non-prescription drugs, or dietary supplements you use. Also tell them if you smoke, drink alcohol, or use illegal drugs. Some items may interact with your medicine. What should I watch for while using this medication? Tell your care team if your symptoms do not get better or if they get worse. Visit your care team for regular checks on your progress. Because it may take several weeks to see the full effects of this medication, it is important to continue your treatment as prescribed. This medication may cause thoughts of suicide or depression. This includes sudden changes in mood, behaviors, or thoughts. These changes can happen at any time but are more common in the beginning of treatment or after a change in dose. Call your care team right away if you experience these thoughts or worsening depression. This medication may cause mood and  behavior changes, such as anxiety, nervousness, irritability, hostility, restlessness, excitability, hyperactivity, or trouble sleeping. These changes can happen at any time but are more common in the beginning of treatment or after a change in dose. Call your care team right away if you notice any of these symptoms. This medication may cause serious skin reactions. They can happen weeks to months after starting the medication. Contact your care team right away if you notice fevers or flu-like symptoms with a rash. The rash may be red or purple and then turn into blisters or peeling of the skin. You may also notice a red rash with swelling of the face, lips, or lymph nodes in your neck or under your arms. Avoid drinks that contain alcohol while taking this medication. Drinking large amounts of alcohol, using sleeping or anxiety medications, or quickly stopping the use of these agents while taking this medication may increase your risk for a seizure. This medication may affect your coordination, reaction time, or judgment. Do not drive or operate machinery until you know how this medication affects you. Do not take this medication close to bedtime. It may prevent you from sleeping. Your mouth may get dry. Chewing sugarless gum or sucking hard candy and drinking plenty of water may help. Contact your care team if the problem does not go away or is severe. What side effects may I notice from receiving this medication? Side effects that you should report to your care team as soon as possible: Allergic reactions--skin rash, itching, hives,  swelling of the face, lips, tongue, or throat Increase in blood pressure Mood and behavior changes--anxiety, nervousness, confusion, hallucinations, irritability, hostility, thoughts of suicide or self-harm, worsening mood, feelings of depression Redness, blistering, peeling, or loosening of the skin, including inside the mouth Seizures Sudden eye pain or change in vision  such as blurry vision, seeing halos around lights, vision loss Side effects that usually do not require medical attention (report to your care team if they continue or are bothersome): Constipation Dizziness Dry mouth Loss of appetite Nausea Tremors or shaking Trouble sleeping This list may not describe all possible side effects. Call your doctor for medical advice about side effects. You may report side effects to FDA at 1-800-FDA-1088. Where should I keep my medication? Keep out of the reach of children and pets. Store at room temperature between 20 and 25 degrees C (68 and 77 degrees F), away from direct sunlight and moisture. Keep tightly closed. Throw away any unused medication after the expiration date. NOTE: This sheet is a summary. It may not cover all possible information. If you have questions about this medicine, talk to your doctor, pharmacist, or health care provider.  2024 Elsevier/Gold Standard (2021-10-16 00:00:00)

## 2022-09-08 DIAGNOSIS — F419 Anxiety disorder, unspecified: Secondary | ICD-10-CM | POA: Diagnosis not present

## 2022-09-08 DIAGNOSIS — I252 Old myocardial infarction: Secondary | ICD-10-CM | POA: Diagnosis not present

## 2022-09-08 DIAGNOSIS — Z7982 Long term (current) use of aspirin: Secondary | ICD-10-CM | POA: Diagnosis not present

## 2022-09-08 DIAGNOSIS — M109 Gout, unspecified: Secondary | ICD-10-CM | POA: Diagnosis not present

## 2022-09-08 DIAGNOSIS — J432 Centrilobular emphysema: Secondary | ICD-10-CM | POA: Diagnosis not present

## 2022-09-08 DIAGNOSIS — M21372 Foot drop, left foot: Secondary | ICD-10-CM | POA: Diagnosis not present

## 2022-09-08 DIAGNOSIS — J440 Chronic obstructive pulmonary disease with acute lower respiratory infection: Secondary | ICD-10-CM | POA: Diagnosis not present

## 2022-09-08 DIAGNOSIS — M779 Enthesopathy, unspecified: Secondary | ICD-10-CM | POA: Diagnosis not present

## 2022-09-08 DIAGNOSIS — M549 Dorsalgia, unspecified: Secondary | ICD-10-CM | POA: Diagnosis not present

## 2022-09-08 DIAGNOSIS — F32A Depression, unspecified: Secondary | ICD-10-CM | POA: Diagnosis not present

## 2022-09-08 DIAGNOSIS — E039 Hypothyroidism, unspecified: Secondary | ICD-10-CM | POA: Diagnosis not present

## 2022-09-08 DIAGNOSIS — R1310 Dysphagia, unspecified: Secondary | ICD-10-CM | POA: Diagnosis not present

## 2022-09-08 DIAGNOSIS — E119 Type 2 diabetes mellitus without complications: Secondary | ICD-10-CM | POA: Diagnosis not present

## 2022-09-08 DIAGNOSIS — K449 Diaphragmatic hernia without obstruction or gangrene: Secondary | ICD-10-CM | POA: Diagnosis not present

## 2022-09-08 DIAGNOSIS — G4700 Insomnia, unspecified: Secondary | ICD-10-CM | POA: Diagnosis not present

## 2022-09-08 DIAGNOSIS — I10 Essential (primary) hypertension: Secondary | ICD-10-CM | POA: Diagnosis not present

## 2022-09-08 DIAGNOSIS — Z79891 Long term (current) use of opiate analgesic: Secondary | ICD-10-CM | POA: Diagnosis not present

## 2022-09-08 DIAGNOSIS — K219 Gastro-esophageal reflux disease without esophagitis: Secondary | ICD-10-CM | POA: Diagnosis not present

## 2022-09-08 DIAGNOSIS — I2510 Atherosclerotic heart disease of native coronary artery without angina pectoris: Secondary | ICD-10-CM | POA: Diagnosis not present

## 2022-09-08 DIAGNOSIS — N179 Acute kidney failure, unspecified: Secondary | ICD-10-CM | POA: Diagnosis not present

## 2022-09-08 DIAGNOSIS — G9341 Metabolic encephalopathy: Secondary | ICD-10-CM | POA: Diagnosis not present

## 2022-09-08 DIAGNOSIS — J9601 Acute respiratory failure with hypoxia: Secondary | ICD-10-CM | POA: Diagnosis not present

## 2022-09-08 DIAGNOSIS — G8929 Other chronic pain: Secondary | ICD-10-CM | POA: Diagnosis not present

## 2022-09-08 DIAGNOSIS — J189 Pneumonia, unspecified organism: Secondary | ICD-10-CM | POA: Diagnosis not present

## 2022-09-08 DIAGNOSIS — Z5982 Transportation insecurity: Secondary | ICD-10-CM | POA: Diagnosis not present

## 2022-09-11 ENCOUNTER — Ambulatory Visit: Payer: Medicare HMO | Admitting: Licensed Clinical Social Worker

## 2022-09-14 ENCOUNTER — Other Ambulatory Visit: Payer: Self-pay | Admitting: Cardiovascular Disease

## 2022-09-14 ENCOUNTER — Other Ambulatory Visit: Payer: Self-pay | Admitting: Internal Medicine

## 2022-09-14 ENCOUNTER — Ambulatory Visit: Payer: Medicare HMO | Admitting: Licensed Clinical Social Worker

## 2022-09-14 DIAGNOSIS — E039 Hypothyroidism, unspecified: Secondary | ICD-10-CM

## 2022-09-14 DIAGNOSIS — R0789 Other chest pain: Secondary | ICD-10-CM

## 2022-09-14 DIAGNOSIS — I10 Essential (primary) hypertension: Secondary | ICD-10-CM

## 2022-09-18 ENCOUNTER — Telehealth: Payer: Self-pay | Admitting: Psychiatry

## 2022-09-18 ENCOUNTER — Ambulatory Visit: Payer: Medicare HMO | Admitting: Licensed Clinical Social Worker

## 2022-09-18 NOTE — Telephone Encounter (Signed)
CALLED & SPOKE TO WIFE MRS Chase Mendez & INFORMED DOER PROVIDER -- stop the wellbutrin and go to urgent care if still having sideeffects.  MRS Chase Mendez INFORMED PATIENT WAS STILL ASLEEP & SHE WOULD INFORM WHEN AWAKES

## 2022-09-18 NOTE — Telephone Encounter (Signed)
Direce please let them know to stop the wellbutrin and go to urgent care if still having sideeffects.

## 2022-09-18 NOTE — Telephone Encounter (Signed)
Wife states Chase Mendez has been on Welbutrin for 1 week. Past Saturday and Sunday he has symptoms of shaky, hallucinations, and felt like he was about to jump out of his skin. Wife did give him Atarax on Sunday but it did not seem to help. Please advise

## 2022-09-18 NOTE — Telephone Encounter (Signed)
Noted and thank you

## 2022-09-20 ENCOUNTER — Ambulatory Visit: Payer: Medicare HMO | Admitting: Licensed Clinical Social Worker

## 2022-10-05 ENCOUNTER — Telehealth: Payer: Self-pay | Admitting: Urology

## 2022-10-05 NOTE — Telephone Encounter (Signed)
Patient's wife called and stated that the [place] can help cover the cost of Myrbetriq. She said they will need our office to fax over explanation of why patient needs to take this medication, and if any other medications were tried before this one. [facility] [phone] send to attention of Dr. Costella Hatcher.

## 2022-10-05 NOTE — Telephone Encounter (Signed)
I faxed over the only office notice that we have to the [place]. I tried to call patient to let them know but there was not voice mail . I talked to daughter to let her know that I fax over office notes to [place]

## 2022-10-13 ENCOUNTER — Other Ambulatory Visit: Payer: Self-pay | Admitting: Psychiatry

## 2022-10-13 DIAGNOSIS — F331 Major depressive disorder, recurrent, moderate: Secondary | ICD-10-CM

## 2022-10-13 DIAGNOSIS — F419 Anxiety disorder, unspecified: Secondary | ICD-10-CM

## 2022-10-25 ENCOUNTER — Encounter: Payer: Self-pay | Admitting: Psychiatry

## 2022-10-25 ENCOUNTER — Ambulatory Visit (INDEPENDENT_AMBULATORY_CARE_PROVIDER_SITE_OTHER): Payer: PRIVATE HEALTH INSURANCE | Admitting: Psychiatry

## 2022-10-25 VITALS — BP 94/53 | HR 66 | Temp 98.1°F | Ht 72.0 in | Wt 189.6 lb

## 2022-10-25 DIAGNOSIS — Z634 Disappearance and death of family member: Secondary | ICD-10-CM

## 2022-10-25 DIAGNOSIS — F411 Generalized anxiety disorder: Secondary | ICD-10-CM

## 2022-10-25 DIAGNOSIS — F331 Major depressive disorder, recurrent, moderate: Secondary | ICD-10-CM

## 2022-10-25 DIAGNOSIS — R419 Unspecified symptoms and signs involving cognitive functions and awareness: Secondary | ICD-10-CM | POA: Diagnosis not present

## 2022-10-25 NOTE — Progress Notes (Signed)
BH MD OP Progress Note  10/25/2022 1:51 PM Chase Mendez  MRN:  [id number]  Chief Complaint:  Chief Complaint  Patient presents with   Follow-up   Depression   Anxiety   Medication Refill   HPI: Chase Mendez is a 88 year old Caucasian male, veteran, retired from [employer], lives in [place] with his wife, has a history of MDD, GAD, chronic pain, bereavement, foot drop left-sided, hypothyroidism, hypertension, history of frequent falls, recent pneumonia with hospital admission, presented for follow-up appointment.  Patient as well as wife participated in the evaluation.  Patient today reports he continues to grieve the death of his ex-wife who passed away last month.  According to wife a friend passed away recently as well.  Hence it has been difficult.  Patient continues to have low mood, anxiety mostly regarding the deaths in his life, his physical limitations.  He continues to struggle with vision impairment.  His providers recently told him they could not help him much with his visual changes.  That has been frustrating.  Patient also is hard of hearing and is planning to get that corrected.  Patient hence does not have a lot he can do throughout the day.  He does watch TV.  He smokes cigarettes.  Those are his outlets according to wife.  Patient also has a 32 year old cat called [name] who is currently sick and they are planning to take the to the vet today.  That has been anxiety provoking as well.  The cat was therapeutic for the patient according to wife.  Patient currently denies any suicidality, homicidality or perceptual disturbances.  Patient appeared to be alert, oriented to person place situation.  Patient was able to give the month as September, the date as [birth year]..  3 word memory immediate 3 out of 3, after 5 minutes 3 out of 3.  Patient had to stop taking the Wellbutrin since it caused him hallucinations.  He is currently only on the sertraline and hydroxyzine as needed.   Patient as well as wife would like to stay on this current medication regimen.  Motivated to pursue psychotherapy when it was discussed with patient.  Denies any other concerns today.  Visit Diagnosis:    ICD-10-CM   1. MDD (major depressive disorder), recurrent episode, moderate (HCC)  F33.1     2. GAD (generalized anxiety disorder)  F41.1     3. Neurocognitive disorder  R41.9    Likely mild    4. Bereavement  Z63.4       Past Psychiatric History: I have reviewed past psychiatric history from progress note on 09/07/2022.  Past trials of medications like Vraylar, Remeron, Celexa, risperidone, Cymbalta, Wellbutrin.  Past Medical History:  Past Medical History:  Diagnosis Date   Acute encephalopathy 12/08/2014   Anxiety    ARF (acute renal failure) (HCC) 12/08/2014   Back pain    Benign neoplasm of large bowel    Capsulitis    fractured displaced metatarsal with capsulitis   Chronic back pain    Coronavirus infection    Depression    Diabetes mellitus without complication (HCC)    no medications currently   Dysphagia    Exostosis    painful, right hallux   Foot drop, left    wears a brace   Frequent falls 02/2018   poor balance, foot drop   GERD (gastroesophageal reflux disease)    Gout    Hypertension    Hypothyroidism    Insomnia  Low testosterone    Microscopic hematuria 2016   Myocardial infarction [facility])    patient unaware when it happened years ago.     Pneumonia 12/07/2014   Pressure ulcer 12/09/2014   Sepsis (HCC) 12/08/2014   Thyroid disease     Past Surgical History:  Procedure Laterality Date   APPENDECTOMY     BACK SURGERY  1995, 1996   x 2   CHOLECYSTECTOMY     ESOPHAGOGASTRODUODENOSCOPY (EGD) WITH PROPOFOL N/A 04/24/2017   Procedure: ESOPHAGOGASTRODUODENOSCOPY (EGD) WITH PROPOFOL;  Surgeon: Scot Jun, MD;  Location: [facility] ENDOSCOPY;  Service: Endoscopy;  Laterality: N/A;   EYE SURGERY Bilateral 1983, 1985   cataract extractions   SPINAL  CORD STIMULATOR INSERTION N/A 03/13/2018   Procedure: SPINAL CORD STIMULATOR INSERTION;  Surgeon: Venetia Night, MD;  Location: [facility] ORS;  Service: Neurosurgery;  Laterality: N/A;    Family Psychiatric History: Reviewed family psychiatric history, unknown mental health history will need to explore it in future sessions.  Family History:  Family History  Problem Relation Age of Onset   Heart disease Mother    Diabetes Father    Kidney cancer Brother     Social History: As noted below. Social History   Socioeconomic History   Marital status: Married    Spouse name: dorothy   Number of children: 2   Years of education: Not on file   Highest education level: Some college, no degree  Occupational History   Occupation: maintenance / repair    Comment: disabled  Tobacco Use   Smoking status: Every Day    Types: Cigars   Smokeless tobacco: Never   Tobacco comments:    unable to give cessation materials due to webex visit   Vaping Use   Vaping status: Never Used  Substance and Sexual Activity   Alcohol use: No   Drug use: Yes    Types: Oxycodone   Sexual activity: Not Currently  Other Topics Concern   Not on file  Social History Narrative   Not on file   Social Determinants of Health   Financial Resource Strain: Not on file  Food Insecurity: No Food Insecurity (07/17/2022)   Hunger Vital Sign    Worried About Running Out of Food in the Last Year: Never true    Ran Out of Food in the Last Year: Never true  Transportation Needs: No Transportation Needs (07/17/2022)   PRAPARE - Administrator, Civil Service (Medical): No    Lack of Transportation (Non-Medical): No  Physical Activity: Not on file  Stress: Not on file  Social Connections: Not on file    Allergies:  Allergies  Allergen Reactions   Wellbutrin [Bupropion]     hallucinations   Doxycycline Nausea Only   Ibuprofen Nausea Only   Sulfa Antibiotics Nausea Only    Metabolic Disorder  Labs: Lab Results  Component Value Date   HGBA1C 5.6 07/18/2022   MPG 114.02 07/18/2022   No results found for: "PROLACTIN" Lab Results  Component Value Date   CHOL 94 (L) 08/31/2022   TRIG 71 08/31/2022   HDL 34 (L) 08/31/2022   CHOLHDL 2.8 08/31/2022   VLDL 23 12/08/2014   LDLCALC 45 08/31/2022   LDLCALC 39 05/31/2022   Lab Results  Component Value Date   TSH 4.510 (H) 08/31/2022   TSH 1.890 05/31/2022    Therapeutic Level Labs: No results found for: "LITHIUM" No results found for: "VALPROATE" No results found for: "CBMZ"  Current Medications: Current  Outpatient Medications  Medication Sig Dispense Refill   acetaminophen (TYLENOL) 500 MG tablet Take 500-1,000 mg by mouth 2 (two) times daily as needed for mild pain.     allopurinol (ZYLOPRIM) 100 MG tablet Take 1 tablet (100 mg total) by mouth every other day. 45 tablet 1   amLODipine (NORVASC) 10 MG tablet TAKE 1 TABLET BY MOUTH EVERY DAY 90 tablet 1   amLODipine (NORVASC) 5 MG tablet Take 2.5 mg by mouth daily.     aspirin 81 MG tablet Take 81 mg by mouth daily.     atorvastatin (LIPITOR) 10 MG tablet TAKE 1 TABLET BY MOUTH EVERYDAY AT BEDTIME 90 tablet 1   Cholecalciferol 50 MCG (2000 UT) TABS Take by mouth.     famotidine (PEPCID) 20 MG tablet Take 1 tablet by mouth 2 (two) times daily.     fluticasone (FLONASE) 50 MCG/ACT nasal spray Place 1-2 sprays into both nostrils daily as needed for allergies or rhinitis.     gabapentin (NEURONTIN) 600 MG tablet TAKE 1 TABLET BY MOUTH TWICE DAILY. 180 tablet 0   hydrOXYzine (ATARAX) 25 MG tablet Take 0.5-1 tablets (12.5-25 mg total) by mouth daily as needed for anxiety. USE ONLY FOR SEVERE ANXIETY ATTACKS, PLEASE LIMIT USE 30 tablet 1   isosorbide mononitrate (IMDUR) 30 MG 24 hr tablet TAKE 1 TABLET BY MOUTH EVERY DAY 90 tablet 0   levothyroxine (SYNTHROID) 150 MCG tablet Take 1 tablet (150 mcg total) by mouth daily. 90 tablet 1   losartan (COZAAR) 100 MG tablet Take 1 tablet  (100 mg total) by mouth daily. 90 tablet 1   mirabegron ER (MYRBETRIQ) 50 MG TB24 tablet Take 1 tablet (50 mg total) by mouth daily. (Patient taking differently: Take 25 mg by mouth daily.) 30 tablet 11   naloxone (NARCAN) nasal spray 4 mg/0.1 mL Place 1 spray into the nose as needed for up to 365 doses (for opioid-induced respiratory depresssion). In case of emergency (overdose), spray once into each nostril. If no response within 3 minutes, repeat application and call 911. 1 each 0   ondansetron (ZOFRAN-ODT) 4 MG disintegrating tablet TAKE 1 TABLET BY MOUTH THREE TIMES A DAY AS NEEDED FOR NAUSEA 90 tablet 2   Oxycodone HCl 10 MG TABS Take 1 tablet (10 mg total) by mouth every 6 (six) hours as needed. Must last 30 days. 120 tablet 0   [START ON 11/06/2022] Oxycodone HCl 10 MG TABS Take 1 tablet (10 mg total) by mouth every 6 (six) hours as needed. Must last 30 days. 120 tablet 0   sertraline (ZOLOFT) 50 MG tablet TAKE 1/2 TABLET BY MOUTH TWICE DAILY 30 tablet 1   tamsulosin (FLOMAX) 0.4 MG CAPS capsule Take 0.4 mg by mouth daily.     Testosterone 40.5 MG/2.5GM (1.62%) GEL Place 2 Pump onto the skin 3 (three) times a week.     vitamin B-12 (CYANOCOBALAMIN) 1000 MCG tablet Take 1,000 mcg by mouth daily.     Vitamin D, Ergocalciferol, (DRISDOL) 1.25 MG (50000 UNIT) CAPS capsule TAKE 1 CAPSULE BY MOUTH ONE TIME PER WEEK 12 capsule 3   vitamin E 400 UNIT capsule Take 400 Units by mouth daily.     Oxycodone HCl 10 MG TABS Take 1 tablet (10 mg total) by mouth every 6 (six) hours as needed. Must last 30 days. 120 tablet 0   No current facility-administered medications for this visit.     Musculoskeletal: Strength & Muscle Tone: within normal limits Gait & Station:  walks with walker Patient leans: Front  Psychiatric Specialty Exam: Review of Systems  Psychiatric/Behavioral:  Positive for dysphoric mood. The patient is nervous/anxious.     Blood pressure (!) 94/53, pulse 66, temperature 98.1 F  (36.7 C), temperature source Skin, height 6' (1.829 m), weight 189 lb 9.6 oz (86 kg).Body mass index is 25.71 kg/m.  General Appearance: Fairly Groomed  Eye Contact:  Fair  Speech:  Clear and Coherent  Volume:  Decreased  Mood:  Anxious and Depressed  Affect:  Depressed  Thought Process:  Goal Directed and Descriptions of Associations: Intact  Orientation:  Full (Time, Place, and Person)  Thought Content: Logical   Suicidal Thoughts:  No  Homicidal Thoughts:  No  Memory:  Immediate;   Fair Recent;   limited Remote;   limited  Judgement:  Fair  Insight:  Fair  Psychomotor Activity:  Decreased  Concentration:  Concentration: fair and Attention Span: fair  Recall:  [employer] of Knowledge: Fair  Language: Fair  Akathisia:  No  Handed:  Right  AIMS (if indicated): not done  Assets:  Desire for Improvement Housing Intimacy Social Support  ADL's:  Intact  Cognition: WNL  Sleep:  Fair   Screenings: GAD-7    Garment/textile technologist Visit from 10/25/2022 in [facility] Office Visit from 09/07/2022 in [facility] Counselor from 05/29/2022 in [facility] Office Visit from 05/09/2022 in [facility] Counselor from 05/08/2022 in [facility]  Total GAD-7 Score 7 11 18 13 12       PHQ2-9    Flowsheet Row Office Visit from 10/25/2022 in [facility] Office Visit from 09/07/2022 in [facility] Office Visit from 05/31/2022 in [facility] Counselor from 05/29/2022 in [facility] Office Visit from 05/09/2022 in [facility]  PHQ-2 Total Score 5 6 0 6 6  PHQ-9 Total Score 16 14 -- 19 20       Flowsheet Row Office Visit from 10/25/2022 in [facility] Office Visit from 09/07/2022 in [facility] ED to Hosp-Admission (Discharged) from 07/17/2022 in [facility] 1C MEDICAL TELEMETRY  C-SSRS RISK CATEGORY No Risk No Risk No Risk        Assessment and Plan: TOUGER WOLDEN is a 88 year old Caucasian male with history of MDD, neurocognitive disorder, was evaluated in office today.  Patient continues to have multiple situational stressors, health problems, deaths in the family//friends, recent side effects to Wellbutrin, continues to have mood symptoms , will benefit from psychotherapy sessions, continue medications as noted below.  Plan MDD-in partial remission Continue sertraline 25 mg p.o. twice daily Discontinue Wellbutrin for side effects of hallucinations. Patient advised to reestablish care with therapist-I have communicated with staff.  GAD-some improvement Continue hydroxyzine 12.5-25 mg daily as needed for severe anxiety symptoms Continue sertraline as prescribed Restart CBT-patient to establish care with a therapist.  Neurocognitive disorder likely mild-improving Patient will benefit from neuropsychological testing/neurological evaluation as needed.  Patient has good social support at home. Patient to follow up with this provider at the [place].  Bereavement-unstable Provided grief counseling for 5 minutes. Patient will benefit from grief counseling. Will refer for CBT.    Collaboration of Care: Collaboration of Care: Other I have  obtained collateral information from spouse as noted above.  Patient/Guardian was advised Release of Information must be obtained prior to any record release in order to collaborate their care with an outside provider. Patient/Guardian was advised if they have not already done so to contact the registration department to sign all necessary  forms in order for [place] to release information regarding their care.   Consent: Patient/Guardian gives verbal consent for treatment and assignment of benefits for services provided during this visit. Patient/Guardian expressed understanding and agreed to proceed.   Follow-up in clinic in 3 months or sooner if needed.  I have spent atleast 40 minutes face to face with patient today which includes the time spent for preparing to see the patient ( e.g., review of test, records ), obtaining collateral information from spouse since patient is limited physically, hard of hearing as well as visually impaired, ordering medications and test ,psychoeducation and supportive psychotherapy and care coordination,as well as documenting clinical information in electronic health record.   This note was generated in part or whole with voice recognition software. Voice recognition is usually quite accurate but there are transcription errors that can and very often do occur. I apologize for any typographical errors that were not detected and corrected.    Jomarie Longs, MD 10/25/2022, 1:51 PM

## 2022-11-02 ENCOUNTER — Other Ambulatory Visit: Payer: Self-pay | Admitting: Internal Medicine

## 2022-11-02 DIAGNOSIS — K2100 Gastro-esophageal reflux disease with esophagitis, without bleeding: Secondary | ICD-10-CM

## 2022-11-07 ENCOUNTER — Other Ambulatory Visit: Payer: Self-pay | Admitting: Internal Medicine

## 2022-11-09 ENCOUNTER — Other Ambulatory Visit: Payer: Self-pay | Admitting: Internal Medicine

## 2022-11-09 DIAGNOSIS — H6123 Impacted cerumen, bilateral: Secondary | ICD-10-CM | POA: Diagnosis not present

## 2022-11-09 DIAGNOSIS — H903 Sensorineural hearing loss, bilateral: Secondary | ICD-10-CM | POA: Diagnosis not present

## 2022-11-28 ENCOUNTER — Other Ambulatory Visit: Payer: Self-pay | Admitting: Internal Medicine

## 2022-11-28 DIAGNOSIS — E785 Hyperlipidemia, unspecified: Secondary | ICD-10-CM

## 2022-11-29 ENCOUNTER — Other Ambulatory Visit: Payer: Self-pay

## 2022-11-29 ENCOUNTER — Other Ambulatory Visit: Payer: Medicare HMO

## 2022-11-29 DIAGNOSIS — K2100 Gastro-esophageal reflux disease with esophagitis, without bleeding: Secondary | ICD-10-CM

## 2022-11-29 DIAGNOSIS — E782 Mixed hyperlipidemia: Secondary | ICD-10-CM

## 2022-11-29 DIAGNOSIS — E039 Hypothyroidism, unspecified: Secondary | ICD-10-CM

## 2022-11-29 DIAGNOSIS — I10 Essential (primary) hypertension: Secondary | ICD-10-CM

## 2022-11-29 MED ORDER — ONDANSETRON 4 MG PO TBDP
4.0000 mg | ORAL_TABLET | Freq: Four times a day (QID) | ORAL | 3 refills | Status: DC
Start: 2022-11-29 — End: 2023-03-07

## 2022-11-30 LAB — CBC WITH DIFF/PLATELET
Basophils Absolute: 0.1 10*3/uL (ref 0.0–0.2)
Basos: 1 %
EOS (ABSOLUTE): 0.3 10*3/uL (ref 0.0–0.4)
Eos: 5 %
Hematocrit: 41.2 % (ref 37.5–51.0)
Hemoglobin: 13 g/dL (ref 13.0–17.7)
Immature Grans (Abs): 0 10*3/uL (ref 0.0–0.1)
Immature Granulocytes: 1 %
Lymphocytes Absolute: 1.9 10*3/uL (ref 0.7–3.1)
Lymphs: 28 %
MCH: 26.9 pg (ref 26.6–33.0)
MCHC: 31.6 g/dL (ref 31.5–35.7)
MCV: 85 fL (ref 79–97)
Monocytes Absolute: 0.7 10*3/uL (ref 0.1–0.9)
Monocytes: 11 %
Neutrophils Absolute: 3.7 10*3/uL (ref 1.4–7.0)
Neutrophils: 54 %
Platelets: 165 10*3/uL (ref 150–450)
RBC: 4.83 x10E6/uL (ref 4.14–5.80)
RDW: 13.1 % (ref 11.6–15.4)
WBC: 6.6 10*3/uL (ref 3.4–10.8)

## 2022-11-30 LAB — TSH: TSH: 2.58 u[IU]/mL (ref 0.450–4.500)

## 2022-11-30 LAB — LIPID PANEL
Chol/HDL Ratio: 2.8 ratio (ref 0.0–5.0)
Cholesterol, Total: 109 mg/dL (ref 100–199)
HDL: 39 mg/dL — ABNORMAL LOW (ref >39)
LDL Chol Calc (NIH): 52 mg/dL (ref 0–99)
Triglycerides: 94 mg/dL (ref 0–149)
VLDL Cholesterol Cal: 18 mg/dL (ref 5–40)

## 2022-11-30 LAB — COMPREHENSIVE METABOLIC PANEL
ALT: 7 [IU]/L (ref 0–44)
AST: 14 [IU]/L (ref 0–40)
Albumin: 4.1 g/dL (ref 3.7–4.7)
Alkaline Phosphatase: 106 [IU]/L (ref 44–121)
BUN/Creatinine Ratio: 12 (ref 10–24)
BUN: 14 mg/dL (ref 8–27)
Bilirubin Total: 0.4 mg/dL (ref 0.0–1.2)
CO2: 30 mmol/L — ABNORMAL HIGH (ref 20–29)
Calcium: 9 mg/dL (ref 8.6–10.2)
Chloride: 103 mmol/L (ref 96–106)
Creatinine, Ser: 1.17 mg/dL (ref 0.76–1.27)
Globulin, Total: 2.6 g/dL (ref 1.5–4.5)
Glucose: 140 mg/dL — ABNORMAL HIGH (ref 70–99)
Potassium: 4.6 mmol/L (ref 3.5–5.2)
Sodium: 143 mmol/L (ref 134–144)
Total Protein: 6.7 g/dL (ref 6.0–8.5)
eGFR: 61 mL/min/{1.73_m2} (ref >59)

## 2022-11-30 NOTE — Progress Notes (Signed)
PROVIDER NOTE: Information contained herein reflects review and annotations entered in association with encounter. Interpretation of such information and data should be left to medically-trained personnel. Information provided to patient can be located elsewhere in the medical record under "Patient Instructions". Document created using STT-dictation technology, any transcriptional errors that may result from process are unintentional.    Patient: Chase Mendez  Service Category: E/M  Provider: Oswaldo Done, MD  DOB: 08-14-1934  DOS: 12/04/2022  Referring Provider: Sherron Monday, MD  MRN: [id number]  Specialty: Interventional Pain Management  PCP: Sherron Monday, MD  Type: Established Patient  Setting: Ambulatory outpatient    Location: Office  Delivery: Face-to-face     HPI  Chase Mendez, a 88 y.o. year old male, is here today because of his No primary diagnosis found.. Chase Mendez primary complain today is Hip Pain (left)  Pertinent problems: Chase Mendez has Spinal accessory neuropathy; Lower limb pain, inferior (L5) (Foot Drop) (Left); Chronic low back pain (2ry area of Pain) (Bilateral) (ML) (L>R) w/ sciatica (Left); Chronic lower extremity pain (1ry area of Pain) (Left); Failed back surgical syndrome; Foot drop (Left); Lumbosacral radiculopathy at L5 (Left); DDD (degenerative disc disease), lumbar; Chronic pain syndrome; Thoracic central spinal stenosis; Chronic musculoskeletal pain; Neurogenic pain; Presence of neurostimulator; Gout of left foot due to renal impairment; Disturbance of skin sensation; Cervicalgia; Cervical facet syndrome; Bilateral lower abdominal pain; Abnormal MRI, thoracic spine (02/12/2018); DDD (degenerative disc disease), cervical; Cervicogenic headache; Painful cervical range of motion; Impaired range of motion of cervical spine; and Spondylosis without myelopathy or radiculopathy, cervical region on their pertinent problem list. Pain Assessment: Severity  of Chronic pain is reported as a 5 /10. Location: Hip Left/left leg laterally through to toes. Onset: More than a month ago. Quality: Sharp, Tingling. Timing: Intermittent. Modifying factor(s): medication, rest, TENS. Vitals:  height is 6' (1.829 m) and weight is 195 lb 3.2 oz (88.5 kg). His temporal temperature is 97.2 F (36.2 C) (abnormal). His blood pressure is 116/76 and his pulse is 67. His respiration is 20 and oxygen saturation is 95%.  BMI: Estimated body mass index is 26.47 kg/m as calculated from the following:   Height as of this encounter: 6' (1.829 m).   Weight as of this encounter: 195 lb 3.2 oz (88.5 kg). Last encounter: 09/04/2022. Last procedure: 03/16/2022.  Reason for encounter: medication management.  The patient indicates doing well with the current medication regimen. No adverse reactions or side effects reported to the medications.   RTCB: 03/06/2023   Pharmacotherapy Assessment  Analgesic: Oxycodone IR 10 mg, 1 tab PO q 6 hrs. (40 mg/day of oxycodone) MME/day: 60 mg/day.   Monitoring: [place] PMP: PDMP reviewed during this encounter.       Pharmacotherapy: No side-effects or adverse reactions reported. Compliance: No problems identified. Effectiveness: Clinically acceptable.  Rickey Barbara, RN  12/04/2022  9:54 AM  Signed Nursing Pain Medication Assessment:  Safety precautions to be maintained throughout the outpatient stay will include: orient to surroundings, keep bed in low position, maintain call bell within reach at all times, provide assistance with transfer out of bed and ambulation.  Medication Inspection Compliance: Pill count conducted under aseptic conditions, in front of the patient. Neither the pills nor the bottle was removed from the patient's sight at any time. Once count was completed pills were immediately returned to the patient in their original bottle.  Medication: Oxycodone IR Pill/Patch Count:  20 of 120 pills remain Pill/Patch Appearance:  Markings consistent with prescribed medication Bottle Appearance: Standard pharmacy container. Clearly labeled. Filled Date: 09 / 30 / 2024 Last Medication intake:  Today    No results found for: "CBDTHCR" No results found for: "D8THCCBX" No results found for: "D9THCCBX"  UDS:  Summary  Date Value Ref Range Status  09/05/2022 Note  Final    Comment:    ==================================================================== [employer] [facility] (MW) ==================================================================== Test                             Result       Flag       Units  Drug Present and Declared for Prescription Verification   Oxycodone                      3918         EXPECTED   ng/mg creat   Oxymorphone                    979          EXPECTED   ng/mg creat   Noroxycodone                   7697         EXPECTED   ng/mg creat   Noroxymorphone                 868          EXPECTED   ng/mg creat    Sources of oxycodone are scheduled prescription medications.    Oxymorphone, noroxycodone, and noroxymorphone are expected    metabolites of oxycodone. Oxymorphone is also available as a    scheduled prescription medication.  ==================================================================== Test                      Result    Flag   Units      Ref Range   Creatinine              34               mg/dL      >=46 ==================================================================== Declared Medications:  The flagging and interpretation on this report are based on the  following declared medications.  Unexpected results may arise from  inaccuracies in the declared medications.   **Note: The testing scope of this panel includes these medications:   Oxycodone   **Note: The testing scope of this panel does not include the  following reported medications:   Acetaminophen (Tylenol)  Allopurinol  Amlodipine  Aspirin  Atorvastatin  Diphenhydramine (Benadryl)  Famotidine   Fluticasone (Flonase)  Gabapentin (Neurontin)  Isosorbide (Imdur)  Levothyroxine  Losartan  Mirabegron (Myrbetriq)  Naloxone (Narcan)  Ondansetron  Sertraline (Zoloft)  Tamsulosin (Flomax)  Testosterone  Vitamin B12  Vitamin D2 (Drisdol)  Vitamin E ==================================================================== For clinical consultation, please call [phone]. ====================================================================       ROS  Constitutional: Denies any fever or chills Gastrointestinal: No reported hemesis, hematochezia, vomiting, or acute GI distress Musculoskeletal: Denies any acute onset joint swelling, redness, loss of ROM, or weakness Neurological: No reported episodes of acute onset apraxia, aphasia, dysarthria, agnosia, amnesia, paralysis, loss of coordination, or loss of consciousness  Medication Review  Cholecalciferol, Oxycodone HCl, Testosterone, Vitamin D (Ergocalciferol), acetaminophen, allopurinol, amLODipine, aspirin, atorvastatin, cyanocobalamin, diphenhydrAMINE, famotidine, fluticasone, gabapentin, hydrOXYzine, isosorbide mononitrate, levothyroxine, losartan, mirabegron ER, naloxone, ondansetron, sertraline, tamsulosin, and vitamin  E  History Review  Allergy: Chase Mendez is allergic to wellbutrin [bupropion], doxycycline, ibuprofen, and sulfa antibiotics. Drug: Chase Mendez  reports current drug use. Drug: Oxycodone. Alcohol:  reports no history of alcohol use. Tobacco:  reports that he has been smoking cigars. He has never used smokeless tobacco. Social: Mr. Durkee  reports that he has been smoking cigars. He has never used smokeless tobacco. He reports current drug use. Drug: Oxycodone. He reports that he does not drink alcohol. Medical:  has a past medical history of Acute encephalopathy (12/08/2014), Anxiety, ARF (acute renal failure) (HCC) (12/08/2014), Back pain, Benign neoplasm of large bowel, Capsulitis, Chronic back pain, Coronavirus  infection, Depression, Diabetes mellitus without complication (HCC), Dysphagia, Exostosis, Foot drop, left, Frequent falls (02/2018), GERD (gastroesophageal reflux disease), Gout, Hypertension, Hypothyroidism, Insomnia, Low testosterone, Microscopic hematuria (2016), Myocardial infarction (HCC), Pneumonia (12/07/2014), Pressure ulcer (12/09/2014), Sepsis (HCC) (12/08/2014), and Thyroid disease. Surgical: Chase Mendez  has a past surgical history that includes Cholecystectomy; Esophagogastroduodenoscopy (egd) with propofol (N/A, 04/24/2017); Back surgery (1995, 1996); Eye surgery (Bilateral, 1983, 1985); Appendectomy; and Spinal cord stimulator insertion (N/A, 03/13/2018). Family: family history includes Diabetes in his father; Heart disease in his mother; Kidney cancer in his brother.  Laboratory Chemistry Profile   Renal Lab Results  Component Value Date   BUN 14 11/29/2022   CREATININE 1.17 11/29/2022   BCR 12 11/29/2022   GFRAA >60 01/28/2019   GFRNONAA >60 07/19/2022    Hepatic Lab Results  Component Value Date   AST 14 11/29/2022   ALT 7 11/29/2022   ALBUMIN 4.1 11/29/2022   ALKPHOS 106 11/29/2022   HCVAB <0.1 07/24/2018   LIPASE 37 08/21/2016    Electrolytes Lab Results  Component Value Date   NA 143 11/29/2022   K 4.6 11/29/2022   CL 103 11/29/2022   CALCIUM 9.0 11/29/2022   MG 2.2 07/18/2022   PHOS 2.6 07/19/2022    Bone Lab Results  Component Value Date   TESTOSTERONE 513 07/12/2022    Inflammation (CRP: Acute Phase) (ESR: Chronic Phase) Lab Results  Component Value Date   CRP 6.6 (H) 07/19/2022   LATICACIDVEN 1.7 07/17/2022         Note: Above Lab results reviewed.  Recent Imaging Review  US Venous Img Lower Bilateral (DVT) CLINICAL DATA:  16109 Swelling 60454  EXAM: BILATERAL LOWER EXTREMITY VENOUS DOPPLER ULTRASOUND  TECHNIQUE: Gray-scale sonography with graded compression, as well as color Doppler and duplex ultrasound were performed to evaluate the  lower extremity deep venous systems from the level of the common femoral vein and including the common femoral, femoral, profunda femoral, popliteal and calf veins including the posterior tibial, peroneal and gastrocnemius veins when visible. The superficial great saphenous vein was also interrogated. Spectral Doppler was utilized to evaluate flow at rest and with distal augmentation maneuvers in the common femoral, femoral and popliteal veins.  COMPARISON:  None Available.  FINDINGS: RIGHT LOWER EXTREMITY  Common Femoral Vein: No evidence of thrombus. Normal compressibility, respiratory phasicity and response to augmentation.  Saphenofemoral Junction: No evidence of thrombus. Normal compressibility and flow on color Doppler imaging.  Profunda Femoral Vein: No evidence of thrombus. Normal compressibility and flow on color Doppler imaging.  Femoral Vein: No evidence of thrombus. Normal compressibility, respiratory phasicity and response to augmentation.  Popliteal Vein: No evidence of thrombus. Normal compressibility, respiratory phasicity and response to augmentation.  Calf Veins: Calf veins were poorly visualized on grayscale imaging, but were without evidence of thrombus on color Doppler  evaluation  Superficial Great Saphenous Vein: No evidence of thrombus. Normal compressibility.  Venous Reflux:  None.  Other Findings:  None.  LEFT LOWER EXTREMITY  Common Femoral Vein: No evidence of thrombus. Normal compressibility, respiratory phasicity and response to augmentation.  Saphenofemoral Junction: No evidence of thrombus. Normal compressibility and flow on color Doppler imaging.  Profunda Femoral Vein: No evidence of thrombus. Normal compressibility and flow on color Doppler imaging.  Femoral Vein: No evidence of thrombus. Normal compressibility, respiratory phasicity and response to augmentation.  Popliteal Vein: No evidence of thrombus. Normal  compressibility, respiratory phasicity and response to augmentation.  Calf Veins: Calf veins were poorly visualized on grayscale imaging, but were without evidence of thrombus on color Doppler evaluation  Superficial Great Saphenous Vein: No evidence of thrombus. Normal compressibility.  Venous Reflux:  None.  Other Findings: Soft tissue swelling in the left-greater-than-right lower extremity.  IMPRESSION: No evidence of deep venous thrombosis in either lower extremity.  Electronically Signed   By: Lorenza Cambridge M.D.   On: 07/17/2022 15:43 CT Angio Chest PE W/Cm &/Or Wo Cm CLINICAL DATA:  Pulmonary embolism suspected, high probability  EXAM: CT ANGIOGRAPHY CHEST WITH CONTRAST  TECHNIQUE: Multidetector CT imaging of the chest was performed using the standard protocol during bolus administration of intravenous contrast. Multiplanar CT image reconstructions and MIPs were obtained to evaluate the vascular anatomy.  RADIATION DOSE REDUCTION: This exam was performed according to the departmental dose-optimization program which includes automated exposure control, adjustment of the mA and/or kV according to patient size and/or use of iterative reconstruction technique.  CONTRAST:  50mL OMNIPAQUE IOHEXOL 350 MG/ML SOLN  COMPARISON:  None Available.  FINDINGS: Cardiovascular: Conventional 3 vessel arch anatomy. Atherosclerotic calcifications throughout the aorta and along the coronary arteries. Mild cardiomegaly. The pulmonary arteries are well opacified to the proximal segmental level. No evidence of acute pulmonary embolus. No pericardial effusion.  Mediastinum/Nodes: Unremarkable CT appearance of the thyroid gland. No suspicious mediastinal or hilar adenopathy. No soft tissue mediastinal mass. Small hiatal hernia.  Lungs/Pleura: Multifocal ground-glass attenuation airspace opacities and small nodules throughout the right upper lobe in a peribronchovascular  distribution. Diffuse right lower lobe bronchial wall thickening with a combination of peribronchovascular patchy airspace opacities and volume loss. Similar but less impressive findings in the left lower lobe. Mild background of centrilobular pulmonary emphysema. No pleural effusion or pneumothorax.  Upper Abdomen: Submucosal edema at the hepatic flexure and proximal ascending colon. Inflammatory stranding is present in the pericolonic fat. The liver demonstrates a cirrhotic morphology with faint nodularity and hypertrophy of the left lobe relative to the right. The gallbladder is surgically absent.  Musculoskeletal: No chest wall abnormality. No acute or significant osseous findings. Epidural spinal stimulator in place.  Review of the MIP images confirms the above findings.  IMPRESSION: 1. CT findings are most consistent with multi lobar pneumonia involving the right upper, right lower and, to a lesser extent, the left lower lobes. 2. Negative for acute pulmonary embolus. 3. Additionally, the visualized hepatic flexure and proximal transverse colon are abnormal with diffuse wall thickening and pericolonic inflammatory stranding. Findings suggest active infectious/inflammatory colitis. 4. Aortic and coronary artery atherosclerotic vascular calcifications. 5. Mild centrilobular pulmonary emphysema. 6. Small hiatal hernia.  Aortic Atherosclerosis (ICD10-I70.0) and Emphysema (ICD10-J43.9).  Electronically Signed   By: Malachy Moan M.D.   On: 07/17/2022 08:19 DG Chest Port 1 View CLINICAL DATA:  Questionable sepsis  EXAM: PORTABLE CHEST 1 VIEW  COMPARISON:  01/27/2019  FINDINGS: Low volume chest  with interstitial crowding at the bases. No edema, effusion, or pneumothorax. Normal heart size and mediastinal contours for technique. Spinal cord stimulator the lower thoracic level.  IMPRESSION: Low volume chest with atelectatic type density at the bases.  Early bronchopneumonia is not excluded.  Electronically Signed   By: Tiburcio Pea M.D.   On: 07/17/2022 05:50 Note: Reviewed        Physical Exam  General appearance: Well nourished, well developed, and well hydrated. In no apparent acute distress Mental status: Alert, oriented x 3 (person, place, & time)       Respiratory: No evidence of acute respiratory distress Eyes: PERLA Vitals: BP 116/76   Pulse 67   Temp (!) 97.2 F (36.2 C) (Temporal)   Resp 20   Ht 6' (1.829 m)   Wt 195 lb 3.2 oz (88.5 kg)   SpO2 95%   BMI 26.47 kg/m  BMI: Estimated body mass index is 26.47 kg/m as calculated from the following:   Height as of this encounter: 6' (1.829 m).   Weight as of this encounter: 195 lb 3.2 oz (88.5 kg). Ideal: Ideal body weight: 77.6 kg (171 lb 1.2 oz) Adjusted ideal body weight: 82 kg (180 lb 11.6 oz)  Assessment   Diagnosis Status  1. Chronic lower extremity pain (1ry area of Pain) (Left)   2. Chronic low back pain (2ry area of Pain) (Bilateral) (ML) (L>R) w/ sciatica (Left)   3. DDD (degenerative disc disease), lumbar   4. Lumbosacral radiculopathy at L5 (Left)   5. Failed back surgical syndrome   6. Chronic pain syndrome   7. Pharmacologic therapy   8. Chronic use of opiate for therapeutic purpose   9. Encounter for medication management   10. Encounter for chronic pain management    Controlled Controlled Controlled   Updated Problems: No problems updated.  Plan of Care  Problem-specific:  No problem-specific Assessment & Plan notes found for this encounter.  Chase Mendez has a current medication list which includes the following long-term medication(s): allopurinol, amlodipine, amlodipine, atorvastatin, famotidine, gabapentin, isosorbide mononitrate, levothyroxine, levothyroxine, losartan, mirabegron er, [START ON 12/06/2022] oxycodone hcl, [START ON 01/05/2023] oxycodone hcl, [START ON 02/04/2023] oxycodone hcl, sertraline, testosterone, and  [DISCONTINUED] diphenhydramine.  Pharmacotherapy (Medications Ordered): Meds ordered this encounter  Medications   Oxycodone HCl 10 MG TABS    Sig: Take 1 tablet (10 mg total) by mouth every 6 (six) hours as needed. Must last 30 days.    Dispense:  120 tablet    Refill:  0    DO NOT: delete (not duplicate); no partial-fill (will deny script to complete), no refill request (F/U required). DISPENSE: 1 day early if closed on fill date. WARN: No CNS-depressants within 8 hrs of med.   Oxycodone HCl 10 MG TABS    Sig: Take 1 tablet (10 mg total) by mouth every 6 (six) hours as needed. Must last 30 days.    Dispense:  120 tablet    Refill:  0    DO NOT: delete (not duplicate); no partial-fill (will deny script to complete), no refill request (F/U required). DISPENSE: 1 day early if closed on fill date. WARN: No CNS-depressants within 8 hrs of med.   Oxycodone HCl 10 MG TABS    Sig: Take 1 tablet (10 mg total) by mouth every 6 (six) hours as needed. Must last 30 days.    Dispense:  120 tablet    Refill:  0    DO NOT: delete (  not duplicate); no partial-fill (will deny script to complete), no refill request (F/U required). DISPENSE: 1 day early if closed on fill date. WARN: No CNS-depressants within 8 hrs of med.   Orders:  No orders of the defined types were placed in this encounter.  Follow-up plan:   Return in about 3 months (around 03/06/2023) for Eval-day (M,W), (F2F), (MM).      Interventional Therapies  Risk  Complexity Considerations:   WNL   Planned  Pending:      Under consideration:   Therapeutic bilateral cervical facet MB RFA #1    Completed:   Diagnostic/therapeutic bilateral cervical facet MBB x2 (03/16/2022) (1st:100/100/100/100) (2nd: 100/100/100/20) Diagnostic/therapeutic left caudal ESI + epidurogram x1 (11/17/2021) (100/100/25/25)    Therapeutic  Palliative (PRN) options:   Therapeutic/palliative left L4 TFESI #2  Therapeutic/palliative left L5 TFESI #2   Bilateral spinal cord stimulator trial (done - 01/17/2018)  Permanent bilateral spinal cord stimulator implant by Dr. Marcell Barlow (neurosurgery) (done - 03/13/2018)   Pharmacotherapy  Nonopioids transferred 01/12/2020: Gabapentin        Recent Visits No visits were found meeting these conditions. Showing recent visits within past 90 days and meeting all other requirements Today's Visits Date Type Provider Dept  12/04/22 Office Visit Delano Metz, MD [facility]  Showing today's visits and meeting all other requirements Future Appointments Date Type Provider Dept  02/28/23 Appointment Delano Metz, MD [facility]  Showing future appointments within next 90 days and meeting all other requirements  I discussed the assessment and treatment plan with the patient. The patient was provided an opportunity to ask questions and all were answered. The patient agreed with the plan and demonstrated an understanding of the instructions.  Patient advised to call back or seek an in-person evaluation if the symptoms or condition worsens.  Duration of encounter: 30 minutes.  Total time on encounter, as per AMA guidelines included both the face-to-face and non-face-to-face time personally spent by the physician and/or other qualified health care professional(s) on the day of the encounter (includes time in activities that require the physician or other qualified health care professional and does not include time in activities normally performed by clinical staff). Physician's time may include the following activities when performed: Preparing to see the patient (e.g., pre-charting review of records, searching for previously ordered imaging, lab work, and nerve conduction tests) Review of prior analgesic pharmacotherapies. Reviewing PMP Interpreting ordered tests (e.g., lab work, imaging, nerve conduction tests) Performing post-procedure evaluations, including interpretation  of diagnostic procedures Obtaining and/or reviewing separately obtained history Performing a medically appropriate examination and/or evaluation Counseling and educating the patient/family/caregiver Ordering medications, tests, or procedures Referring and communicating with other health care professionals (when not separately reported) Documenting clinical information in the electronic or other health record Independently interpreting results (not separately reported) and communicating results to the patient/ family/caregiver Care coordination (not separately reported)  Note by: Oswaldo Done, MD Date: 12/04/2022; Time: 11:18 AM

## 2022-11-30 NOTE — Patient Instructions (Signed)

## 2022-12-04 ENCOUNTER — Ambulatory Visit: Payer: PRIVATE HEALTH INSURANCE | Attending: Pain Medicine | Admitting: Pain Medicine

## 2022-12-04 ENCOUNTER — Encounter: Payer: Self-pay | Admitting: Pain Medicine

## 2022-12-04 DIAGNOSIS — M5442 Lumbago with sciatica, left side: Secondary | ICD-10-CM | POA: Insufficient documentation

## 2022-12-04 DIAGNOSIS — M5417 Radiculopathy, lumbosacral region: Secondary | ICD-10-CM | POA: Insufficient documentation

## 2022-12-04 DIAGNOSIS — M961 Postlaminectomy syndrome, not elsewhere classified: Secondary | ICD-10-CM | POA: Diagnosis not present

## 2022-12-04 DIAGNOSIS — M51369 Other intervertebral disc degeneration, lumbar region without mention of lumbar back pain or lower extremity pain: Secondary | ICD-10-CM

## 2022-12-04 DIAGNOSIS — G8929 Other chronic pain: Secondary | ICD-10-CM | POA: Diagnosis not present

## 2022-12-04 DIAGNOSIS — Z79891 Long term (current) use of opiate analgesic: Secondary | ICD-10-CM | POA: Diagnosis not present

## 2022-12-04 DIAGNOSIS — G894 Chronic pain syndrome: Secondary | ICD-10-CM | POA: Insufficient documentation

## 2022-12-04 DIAGNOSIS — Z79899 Other long term (current) drug therapy: Secondary | ICD-10-CM | POA: Insufficient documentation

## 2022-12-04 DIAGNOSIS — M79605 Pain in left leg: Secondary | ICD-10-CM | POA: Insufficient documentation

## 2022-12-04 MED ORDER — OXYCODONE HCL 10 MG PO TABS
10.0000 mg | ORAL_TABLET | Freq: Four times a day (QID) | ORAL | 0 refills | Status: DC | PRN
Start: 2022-12-06 — End: 2023-02-27

## 2022-12-04 MED ORDER — OXYCODONE HCL 10 MG PO TABS: 10.0000 mg | ORAL_TABLET | Freq: Four times a day (QID) | ORAL | 0 refills | Status: DC | PRN

## 2022-12-04 NOTE — Progress Notes (Signed)
Nursing Pain Medication Assessment:  Safety precautions to be maintained throughout the outpatient stay will include: orient to surroundings, keep bed in low position, maintain call bell within reach at all times, provide assistance with transfer out of bed and ambulation.  Medication Inspection Compliance: Pill count conducted under aseptic conditions, in front of the patient. Neither the pills nor the bottle was removed from the patient's sight at any time. Once count was completed pills were immediately returned to the patient in their original bottle.  Medication: Oxycodone IR Pill/Patch Count:  20 of 120 pills remain Pill/Patch Appearance: Markings consistent with prescribed medication Bottle Appearance: Standard pharmacy container. Clearly labeled. Filled Date: 09 / 30 / 2024 Last Medication intake:  Today

## 2022-12-05 ENCOUNTER — Ambulatory Visit (INDEPENDENT_AMBULATORY_CARE_PROVIDER_SITE_OTHER): Payer: Medicare HMO | Admitting: Internal Medicine

## 2022-12-05 VITALS — BP 130/60 | HR 66 | Ht 72.0 in | Wt <= 1120 oz

## 2022-12-05 DIAGNOSIS — E782 Mixed hyperlipidemia: Secondary | ICD-10-CM

## 2022-12-05 DIAGNOSIS — Z0001 Encounter for general adult medical examination with abnormal findings: Secondary | ICD-10-CM | POA: Diagnosis not present

## 2022-12-05 DIAGNOSIS — E039 Hypothyroidism, unspecified: Secondary | ICD-10-CM

## 2022-12-05 DIAGNOSIS — Z1331 Encounter for screening for depression: Secondary | ICD-10-CM | POA: Diagnosis not present

## 2022-12-05 DIAGNOSIS — L89511 Pressure ulcer of right ankle, stage 1: Secondary | ICD-10-CM

## 2022-12-05 DIAGNOSIS — I10 Essential (primary) hypertension: Secondary | ICD-10-CM | POA: Diagnosis not present

## 2022-12-05 NOTE — Progress Notes (Signed)
Established Patient Office Visit  Subjective:  Patient ID: Chase Mendez, male    DOB: [birth year]  Age: 88 y.o. MRN: [id number]  Chief Complaint  Patient presents with   Medicare Wellness    AWV    No new complaints, here for AWV refer to quality metrics and scanned documents.   Fell in the bathroom a few wks ago without any injury per wife. C/o pain over his right ankle.   No other concerns at this time.   Past Medical History:  Diagnosis Date   Acute encephalopathy 12/08/2014   Anxiety    ARF (acute renal failure) (HCC) 12/08/2014   Back pain    Benign neoplasm of large bowel    Capsulitis    fractured displaced metatarsal with capsulitis   Chronic back pain    Coronavirus infection    Depression    Diabetes mellitus without complication (HCC)    no medications currently   Dysphagia    Exostosis    painful, right hallux   Foot drop, left    wears a brace   Frequent falls 02/2018   poor balance, foot drop   GERD (gastroesophageal reflux disease)    Gout    Hypertension    Hypothyroidism    Insomnia    Low testosterone    Microscopic hematuria 2016   Myocardial infarction [facility])    patient unaware when it happened years ago.     Pneumonia 12/07/2014   Pressure ulcer 12/09/2014   Sepsis (HCC) 12/08/2014   Thyroid disease     Past Surgical History:  Procedure Laterality Date   APPENDECTOMY     BACK SURGERY  1995, 1996   x 2   CHOLECYSTECTOMY     ESOPHAGOGASTRODUODENOSCOPY (EGD) WITH PROPOFOL N/A 04/24/2017   Procedure: ESOPHAGOGASTRODUODENOSCOPY (EGD) WITH PROPOFOL;  Surgeon: Scot Jun, MD;  Location: [facility] ENDOSCOPY;  Service: Endoscopy;  Laterality: N/A;   EYE SURGERY Bilateral 1983, 1985   cataract extractions   SPINAL CORD STIMULATOR INSERTION N/A 03/13/2018   Procedure: SPINAL CORD STIMULATOR INSERTION;  Surgeon: Venetia Night, MD;  Location: [facility] ORS;  Service: Neurosurgery;  Laterality: N/A;    Social History   Socioeconomic History    Marital status: Married    Spouse name: dorothy   Number of children: 2   Years of education: Not on file   Highest education level: Some college, no degree  Occupational History   Occupation: maintenance / repair    Comment: disabled  Tobacco Use   Smoking status: Every Day    Types: Cigars   Smokeless tobacco: Never   Tobacco comments:    unable to give cessation materials due to webex visit   Vaping Use   Vaping status: Never Used  Substance and Sexual Activity   Alcohol use: No   Drug use: Yes    Types: Oxycodone   Sexual activity: Not Currently  Other Topics Concern   Not on file  Social History Narrative   Not on file   Social Determinants of Health   Financial Resource Strain: Not on file  Food Insecurity: No Food Insecurity (07/17/2022)   Hunger Vital Sign    Worried About Running Out of Food in the Last Year: Never true    Ran Out of Food in the Last Year: Never true  Transportation Needs: No Transportation Needs (07/17/2022)   PRAPARE - Administrator, Civil Service (Medical): No    Lack of Transportation (Non-Medical): No  Physical  Activity: Not on file  Stress: Not on file  Social Connections: Not on file  Intimate Partner Violence: Not At Risk (07/17/2022)   Humiliation, Afraid, Rape, and Kick questionnaire    Fear of Current or Ex-Partner: No    Emotionally Abused: No    Physically Abused: No    Sexually Abused: No    Family History  Problem Relation Age of Onset   Heart disease Mother    Diabetes Father    Kidney cancer Brother     Allergies  Allergen Reactions   Wellbutrin [Bupropion]     hallucinations   Doxycycline Nausea Only   Ibuprofen Nausea Only   Sulfa Antibiotics Nausea Only    Review of Systems  Constitutional: Negative.   HENT: Negative.    Eyes: Negative.   Cardiovascular: Negative.   Gastrointestinal:  Positive for nausea.  Genitourinary: Negative.   Skin: Negative.   Neurological: Negative.    Endo/Heme/Allergies: Negative.   Psychiatric/Behavioral:  Positive for depression. The patient is nervous/anxious.        Objective:   BP 130/60   Pulse 66   Ht 6' (1.829 m)   Wt 19 lb (8.618 kg)   SpO2 98%   BMI 2.58 kg/m   Vitals:   12/05/22 1306  BP: 130/60  Pulse: 66  Height: 6' (1.829 m)  Weight: 19 lb (8.618 kg)  SpO2: 98%  BMI (Calculated): 2.58    Physical Exam Vitals reviewed.  Constitutional:      Appearance: Normal appearance.  HENT:     Head: Normocephalic.     Left Ear: There is no impacted cerumen.     Nose: Nose normal.     Mouth/Throat:     Mouth: Mucous membranes are dry.     Pharynx: No posterior oropharyngeal erythema.  Eyes:     Extraocular Movements: Extraocular movements intact.     Pupils: Pupils are equal, round, and reactive to light.  Cardiovascular:     Rate and Rhythm: Regular rhythm.     Chest Wall: PMI is not displaced.     Pulses: Normal pulses.     Heart sounds: Normal heart sounds. No murmur heard. Pulmonary:     Effort: Pulmonary effort is normal.     Breath sounds: Normal air entry. No rhonchi or rales.  Abdominal:     General: Abdomen is flat. Bowel sounds are normal. There is no distension.     Palpations: Abdomen is soft. There is no hepatomegaly, splenomegaly or mass.     Tenderness: There is no abdominal tenderness.  Musculoskeletal:        General: Normal range of motion.     Cervical back: Normal range of motion and neck supple.     Right lower leg: No edema.     Left lower leg: No edema.  Skin:    General: Skin is warm and dry.     Findings: Signs of injury (right ankle pressure injury) present.     Comments: Decreased skin turgor  Neurological:     General: No focal deficit present.     Mental Status: He is alert and oriented to person, place, and time.     Cranial Nerves: No cranial nerve deficit.     Motor: No weakness.  Psychiatric:        Mood and Affect: Mood normal.        Behavior: Behavior  normal.      No results found for any visits on 12/05/22.  Recent  Results (from the past 2160 hour(Ladarious Kresse))  CBC With Diff/Platelet     Status: None   Collection Time: 11/29/22  9:35 AM  Result Value Ref Range   WBC 6.6 3.4 - 10.8 x10E3/uL   RBC 4.83 4.14 - 5.80 x10E6/uL   Hemoglobin 13.0 13.0 - 17.7 g/dL   Hematocrit 16.1 09.6 - 51.0 %   MCV 85 79 - 97 fL   MCH 26.9 26.6 - 33.0 pg   MCHC 31.6 31.5 - 35.7 g/dL   RDW 04.5 40.9 - 81.1 %   Platelets 165 150 - 450 x10E3/uL   Neutrophils 54 Not Estab. %   Lymphs 28 Not Estab. %   Monocytes 11 Not Estab. %   Eos 5 Not Estab. %   Basos 1 Not Estab. %   Neutrophils Absolute 3.7 1.4 - 7.0 x10E3/uL   Lymphocytes Absolute 1.9 0.7 - 3.1 x10E3/uL   Monocytes Absolute 0.7 0.1 - 0.9 x10E3/uL   EOS (ABSOLUTE) 0.3 0.0 - 0.4 x10E3/uL   Basophils Absolute 0.1 0.0 - 0.2 x10E3/uL   Immature Granulocytes 1 Not Estab. %   Immature Grans (Abs) 0.0 0.0 - 0.1 x10E3/uL  TSH     Status: None   Collection Time: 11/29/22  9:35 AM  Result Value Ref Range   TSH 2.580 0.450 - 4.500 uIU/mL  Lipid panel     Status: Abnormal   Collection Time: 11/29/22  9:35 AM  Result Value Ref Range   Cholesterol, Total 109 100 - 199 mg/dL   Triglycerides 94 0 - 149 mg/dL   HDL 39 (L) >91 mg/dL   VLDL Cholesterol Cal 18 5 - 40 mg/dL   LDL Chol Calc (NIH) 52 0 - 99 mg/dL   Chol/HDL Ratio 2.8 0.0 - 5.0 ratio    Comment:                                   T. Chol/HDL Ratio                                             Men  Women                               1/2 Avg.Risk  3.4    3.3                                   Avg.Risk  5.0    4.4                                2X Avg.Risk  9.6    7.1                                3X Avg.Risk 23.4   11.0   Comprehensive metabolic panel     Status: Abnormal   Collection Time: 11/29/22  9:35 AM  Result Value Ref Range   Glucose 140 (H) 70 - 99 mg/dL   BUN 14 8 - 27 mg/dL   Creatinine, Ser 4.78 0.76 - 1.27 mg/dL   eGFR  61 >59  mL/min/1.73   BUN/Creatinine Ratio 12 10 - 24   Sodium 143 134 - 144 mmol/L   Potassium 4.6 3.5 - 5.2 mmol/L   Chloride 103 96 - 106 mmol/L   CO2 30 (H) 20 - 29 mmol/L   Calcium 9.0 8.6 - 10.2 mg/dL   Total Protein 6.7 6.0 - 8.5 g/dL   Albumin 4.1 3.7 - 4.7 g/dL   Globulin, Total 2.6 1.5 - 4.5 g/dL   Bilirubin Total 0.4 0.0 - 1.2 mg/dL   Alkaline Phosphatase 106 44 - 121 IU/L   AST 14 0 - 40 IU/L   ALT 7 0 - 44 IU/L      Assessment & Plan:  As per problem list. Offload right ankle to manage pressure injury.  Problem List Items Addressed This Visit       Endocrine   Hypothyroidism     Other   Mixed hyperlipidemia   Other Visit Diagnoses     Pressure injury of right ankle, stage 1    -  Primary   Primary hypertension           Return in about 3 months (around 03/07/2023) for fu with labs prior.   Total time spent: 30 minutes  Luna Fuse, MD  12/05/2022   This document may have been prepared by [facility] Voice Recognition software and as such may include unintentional dictation errors.

## 2022-12-11 ENCOUNTER — Ambulatory Visit (INDEPENDENT_AMBULATORY_CARE_PROVIDER_SITE_OTHER): Payer: PRIVATE HEALTH INSURANCE | Admitting: Licensed Clinical Social Worker

## 2022-12-11 DIAGNOSIS — F411 Generalized anxiety disorder: Secondary | ICD-10-CM | POA: Diagnosis not present

## 2022-12-11 DIAGNOSIS — F332 Major depressive disorder, recurrent severe without psychotic features: Secondary | ICD-10-CM | POA: Diagnosis not present

## 2022-12-11 NOTE — Progress Notes (Signed)
Comprehensive Clinical Assessment (CCA) Note  12/11/2022 [facility] DERUSHA [id number]  Chief Complaint:  Chief Complaint  Patient presents with   Establish Care   Anxiety   Depression   Visit Diagnosis: Severe episode of recurrent major depressive disorder, without psychotic features (HCC)  GAD (generalized anxiety disorder)   Pt reports functional impairment related to Difficulty with concentration, or problem solving, Difficulty with planning, organizing, or multitasking, Difficulty with judgment, making decisions, and mood/affect regulation.   CCA Biopsychosocial Intake/Chief Complaint:  anxiety, depression, grief/loss  Current Symptoms/Problems: anxiety (uncontrollable worry, tension, irritability) , depression (anhedonia, low mood, fatigue, isolation) , loss of family members and friends   Patient Reported Schizophrenia/Schizoaffective Diagnosis in Past: No   Strengths: playing guitar, singing  Preferences: none  Abilities: playing guitar   Type of Services Patient Feels are Needed: Individual Outpatient Therapy   Initial Clinical Notes/Concerns: No data recorded  Mental Health Symptoms Depression:   Difficulty Concentrating; Change in energy/activity; Tearfulness; Fatigue; Hopelessness; Irritability; Worthlessness; Sleep (too much or little)   Duration of Depressive symptoms:  Greater than two weeks   Mania:   None   Anxiety:    Tension; Worrying; Difficulty concentrating; Fatigue; Irritability; Restlessness; Sleep   Psychosis:   None   Duration of Psychotic symptoms: No data recorded  Trauma:   None   Obsessions:   None   Compulsions:   None   Inattention:   Forgetful   Hyperactivity/Impulsivity:   None   Oppositional/Defiant Behaviors:   None   Emotional Irregularity:   None   Other Mood/Personality Symptoms:  No data recorded   Mental Status Exam Appearance and self-care  Stature:   Average   Weight:   Average weight    Clothing:   Neat/clean   Grooming:   Normal   Cosmetic use:   Age appropriate   Posture/gait:   Normal   Motor activity:   Not Remarkable   Sensorium  Attention:   Normal   Concentration:   Normal   Orientation:   X5   Recall/memory:   Defective in Short-term   Affect and Mood  Affect:   Appropriate   Mood:   Depressed   Relating  Eye contact:   Normal   Facial expression:   Responsive   Attitude toward examiner:   Cooperative   Thought and Language  Speech flow:  Clear and Coherent   Thought content:   Appropriate to Mood and Circumstances   Preoccupation:   None   Hallucinations:   None   Organization:  No data recorded  [employer] of Knowledge:   Good   Intelligence:   Average   Abstraction:   Normal   Judgement:   Good   Reality Testing:   Adequate   Insight:   Good   Decision Making:   Normal   Social Functioning  Social Maturity:   Responsible   Social Judgement:   Normal   Stress  Stressors:   Family conflict; Grief/losses; Financial   Coping Ability:   Overwhelmed   Skill Deficits:   Self-care   Supports:   Family; Support needed     Religion: Religion/Spirituality Are You A Religious Person?: Yes  Leisure/Recreation: Leisure / Recreation Do You Have Hobbies?: Yes Leisure and Hobbies: playing guitar, singing  Exercise/Diet: Exercise/Diet Do You Exercise?: No Have You Gained or Lost A Significant Amount of Weight in the Past Six Months?: No Do You Follow a Special Diet?: No Do You Have Any Trouble Sleeping?:  Yes   CCA Employment/Education Employment/Work Situation: Employment / Work Systems developer: Retired Therapist, art is the [employer] Time Patient has Held a Job?: 21 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?: Yes (Describe in comment) Did You Receive Any Psychiatric  Treatment/Services While in the [employer]?: No  Education: Education Is Patient Currently Attending School?: No Last Grade Completed: 11 Did Garment/textile technologist From [employer]?: No Did You Product manager?: No Did Designer, television/film set?: No Did You Have An Individualized Education Program (IIEP): No Did You Have Any Difficulty At School?: No Patient's Education Has Been Impacted by Current Illness: No   CCA Family/Childhood History Family and Relationship History: Family history Marital status: Married What types of issues is patient dealing with in the relationship?: pt stated that he has close relationship with his wife Nicole Cella. Does patient have children?: Yes How many children?: 3 How is patient's relationship with their children?: pt stated that he has two daughters and one son. Pt stated that he has not spoke to his son in five years.  Childhood History:  Childhood History By whom was/is the patient raised?: Both parents Additional childhood history information: Pt stated that he has a close relationship with his mother and father. How were you disciplined when you got in trouble as a child/adolescent?: Pt stated that his dad would get the switch. Does patient have siblings?: Yes Number of Siblings: 4 Description of patient's current relationship with siblings: P reports all of his siblings have passed away. Pt stated that he misses his brothers. Did patient suffer any verbal/emotional/physical/sexual abuse as a child?: No Has patient ever been sexually abused/assaulted/raped as an adolescent or adult?: No Witnessed domestic violence?: No Has patient been affected by domestic violence as an adult?: No  Child/Adolescent Assessment:     CCA Substance Use Alcohol/Drug Use: Alcohol / Drug Use Pain Medications: See MAR Prescriptions: See MAR Over the Counter: See MAR History of alcohol / drug use?: Yes                         ASAM's:  Six Dimensions of  Multidimensional Assessment  Dimension 1:  Acute Intoxication and/or Withdrawal Potential:      Dimension 2:  Biomedical Conditions and Complications:      Dimension 3:  Emotional, Behavioral, or Cognitive Conditions and Complications:     Dimension 4:  Readiness to Change:     Dimension 5:  Relapse, Continued use, or Continued Problem Potential:     Dimension 6:  Recovery/Living Environment:     ASAM Severity Score:    ASAM Recommended Level of Treatment:     Substance use Disorder (SUD)    Recommendations for Services/Supports/Treatments: Recommendations for Services/Supports/Treatments Recommendations For Services/Supports/Treatments: Individual Therapy  DSM5 Diagnoses: Patient Active Problem List   Diagnosis Date Noted   Neurocognitive disorder 09/07/2022   Hypernatremia 09/04/2022   Mixed hyperlipidemia 09/04/2022   Essential (primary) hypertension 09/04/2022   Chronic gout without tophus 09/04/2022   Acute respiratory failure with hypoxia (HCC) 07/17/2022   MDD (major depressive disorder), recurrent episode, moderate (HCC) 01/10/2022   Spondylosis without myelopathy or radiculopathy, cervical region 01/05/2022    Class: Chronic   Cervicogenic headache 12/26/2021    Class: Chronic   Painful cervical range of motion 12/26/2021    Class: Chronic   Impaired range of motion of cervical spine 12/26/2021    Class:  Chronic   Abnormal MRI, thoracic spine (02/12/2018) 11/17/2021   DDD (degenerative disc disease), cervical 11/17/2021   Chronic constipation 05/05/2021   Hepatic steatosis 05/05/2021   Bilateral lower abdominal pain 03/22/2021   Chronic nausea 03/22/2021   Cervicalgia 11/10/2020   Cervical facet syndrome 11/10/2020   Monoclonal gammopathy of unknown significance (MGUS) 05/16/2020   Anxiety disorder 05/16/2020   Diabetes mellitus (HCC) 05/16/2020   Gastroesophageal reflux disease 05/16/2020   Hypothyroidism 05/16/2020   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    Smokes tobacco daily 05/16/2020   Chronic use of opiate for therapeutic purpose 05/16/2020   Disturbance of skin sensation 01/12/2020   Long term prescription opiate use 01/12/2020   Opiate use 01/12/2020   Uncomplicated opioid dependence (HCC) 10/19/2019   Delirium 01/27/2019   Gout of left foot due to renal impairment 12/16/2018   Presence of neurostimulator 10/22/2018   Encounter for interrogation of neurostimulator 10/22/2018   Chronic musculoskeletal pain 09/11/2018   Neurogenic pain 09/11/2018   Claustrophobia 01/23/2018   Thoracic central spinal stenosis 01/22/2018   Cellulitis of right hand 12/06/2017   Chronic pain syndrome 07/11/2017   DDD (degenerative disc disease), lumbar 06/19/2017   Opioid Drug tolerance 06/10/2017   History of acute renal failure (12/08/2014) 06/10/2017   History of encephalopathy (12/08/2014) 06/10/2017   History of sepsis (12/08/2014) 06/10/2017   Lower limb pain, inferior (L5) (Foot Drop) (Left) 05/16/2017   Chronic low back pain (2ry area of Pain) (Bilateral) (ML) (L>R) w/ sciatica (Left) 05/16/2017   Chronic lower extremity pain (1ry area of Pain) (Left) 05/16/2017   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  Patient presents with mixed symptoms of depression and anxiety citing symptoms of anhedonia, low mood, difficulty sleeping, fatigue, changes to appetite, hopelessness, feelings of  worthlessness, difficulty concentrating, anxious feelings, inability to control worry, and irritability.  Patient reports loss of independence impact his mental health greatly as he feels feelings of worthlessness due to his inability to help his wife and stepdaughter around the home.  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  Patient reports he is struggling with his inability to drive and struggles to rely on others to help him.  Patient identifies recent stressors to include his ex-wife's recent passing, lack of communication with children, loss of close relatives and siblings, and feeling he cannot engage in hobbies he once used to left.  Nicole Cella identifies his supports to include his friends at the [employer] and the [employer]  Nicole Cella and Kathlene November identified they have friends who are able to support them and transportation to [place] to get to the [place] when needed.   Wife expresses concerns about patient's mental health and mood.  She also expresses concerns about patient's increased usage of cigarettes.  Clinician worked with patient and wife to identify goals for treatment.  Patient identifies he would like to get back into the choir again, find ways to help around  the house, go to church more frequently.  Clinician also identifies a need to focus on what the client can do with his abilities and goals to increase socialization.  Clinician also identifies the patient could address unprocessed grief.  Patient denies suicidal ideations, homicidal ideations, auditory/visual hallucinations.  Patient Centered Plan: Patient is on the following Treatment Plan(s):  Anxiety and Depression   Referrals to Alternative Service(s): Referred to Alternative Service(s):   Place:   Date:   Time:    Referred to Alternative Service(s):   Place:   Date:   Time:    Referred to Alternative Service(s):   Place:   Date:   Time:    Referred to Alternative  Service(s):   Place:   Date:   Time:      Collaboration of Care: AEB psychiatrist can access notes and cln. Will review psychiatrists' notes. Check in with the patient and will see LCSW per availability. Patient agreed with treatment recommendations. Pt. is scheduled for a follow-up in December.   Patient/Guardian was advised Release of Information must be obtained prior to any record release in order to collaborate their care with an outside provider. Patient/Guardian was advised if they have not already done so to contact the registration department to sign all necessary forms in order for [place] to release information regarding their care.   Consent: Patient/Guardian gives verbal consent for treatment and assignment of benefits for services provided during this visit. Patient/Guardian expressed understanding and agreed to proceed.   Dereck Leep, LCSW

## 2023-01-01 ENCOUNTER — Other Ambulatory Visit: Payer: Self-pay | Admitting: Cardiovascular Disease

## 2023-01-01 ENCOUNTER — Other Ambulatory Visit: Payer: Self-pay | Admitting: Internal Medicine

## 2023-01-01 DIAGNOSIS — M1A40X0 Other secondary chronic gout, unspecified site, without tophus (tophi): Secondary | ICD-10-CM

## 2023-01-01 DIAGNOSIS — R0789 Other chest pain: Secondary | ICD-10-CM

## 2023-01-10 ENCOUNTER — Ambulatory Visit (INDEPENDENT_AMBULATORY_CARE_PROVIDER_SITE_OTHER): Payer: Medicare HMO | Admitting: Licensed Clinical Social Worker

## 2023-01-10 DIAGNOSIS — F332 Major depressive disorder, recurrent severe without psychotic features: Secondary | ICD-10-CM | POA: Diagnosis not present

## 2023-01-10 DIAGNOSIS — F411 Generalized anxiety disorder: Secondary | ICD-10-CM | POA: Diagnosis not present

## 2023-01-10 NOTE — Progress Notes (Signed)
THERAPIST PROGRESS NOTE  Session Time: 10:06am-10:40am  Participation Level: Active  Behavioral Response: CasualAlertDepressed  Type of Therapy: Individual Therapy  Treatment Goals addressed: Reduce overall frequency, intensity and duration of anxiety so that daily functioning is not impaired per pt self report 3 out of 5 sessions.   Reduce overall frequency, intensity and duration of depression so that daily functioning is not impaired per pt self report 3 out of 5 sessions documented.   ProgressTowards Goals: Initial  Interventions: Supportive and Reframing  Summary: Chase Mendez is a 88 y.o. male who presents with Symptoms of depression citing lack of motivation, low mood, anhedonia, negative self affect. Pt was oriented times 5. Pt was cooperative and engaged. Pt denies SI/HI/AVH.     Patient utilized space to process moods since his intake appointment.  Patient identified "I am not doing too good."  Patient began processing different areas of his life which are contributing to his current mood.  Patient reflected on relationship with his daughter and disappointment she is not reaching out more.  Patient reflected on wife's health identifying she was recently hospitalized over Thanksgiving.  Patient expressed worry about wife's wellbeing.  Patient identified he has been feeling very depressed about his loss of independence and expressed an interest to change his medications.  Clinician worked with patient to identify resources and areas which have been improved by opportunities offered by the [place].  Reflected on his journey with depression identifying he has first noticed his symptoms present in 1995 when he was hurt on the job.  Patient identifies his loss of physical ability and pain from 2 back surgeries has contributed to his loss of purpose and low self-worth.  Patient also reflected on the numerous relationships he is struggling to process and still experiencing grief.  Clinician  and patient explored his pattern of reflecting on the past and reframing his perspective to focus on the future.  Patient identified he is looking forward to visiting his [facility] at the [facility] tomorrow night.  Patient expressed that disinterest in continuing with therapeutic services citing lack of improvement.  Clinician reminded patient he has only had 1 session and encouraged patient to continue to explore options.  Patient expressed an interest in only engaging in therapeutic services when he saw fit citing a need for inconsistent support. Clinician identified barriers and offered solutions for other services.  Patient identified a desire to engage in 1 more session in January.  Patient asked to end session early.  Suicidal/Homicidal: Nowithout intent/plan  Therapist Response: Cln utilized active and supportive reflection to create environment for patient to process recent life stressors and symptoms.  Clinician assessed for stressors, symptoms, safety since last session.  Clinician explored client's experience of transitions and life changes as well as grief processes.  Clinician addressed the impact of coping with feelings of guilt related to his past. Attempted to explore sxs of grief.  Plan: Return again in 4 weeks.  Diagnosis: Severe episode of recurrent major depressive disorder, without psychotic features (HCC)  GAD (generalized anxiety disorder)   Collaboration of Care: AEB psychiatrist can access notes and cln. Will review psychiatrists' notes. Check in with the patient and will see LCSW per availability. Patient agreed with treatment recommendations.   Patient/Guardian was advised Release of Information must be obtained prior to any record release in order to collaborate their care with an outside provider. Patient/Guardian was advised if they have not already done so to contact the registration department to sign all  necessary forms in order for [place] to release information regarding  their care.   Consent: Patient/Guardian gives verbal consent for treatment and assignment of benefits for services provided during this visit. Patient/Guardian expressed understanding and agreed to proceed.   Dereck Leep, LCSW 01/10/2023

## 2023-01-11 ENCOUNTER — Other Ambulatory Visit: Payer: Medicare HMO

## 2023-01-11 DIAGNOSIS — E291 Testicular hypofunction: Secondary | ICD-10-CM | POA: Diagnosis not present

## 2023-01-12 ENCOUNTER — Telehealth: Payer: Self-pay

## 2023-01-12 DIAGNOSIS — F331 Major depressive disorder, recurrent, moderate: Secondary | ICD-10-CM

## 2023-01-12 DIAGNOSIS — F419 Anxiety disorder, unspecified: Secondary | ICD-10-CM

## 2023-01-12 LAB — TESTOSTERONE: Testosterone: 90 ng/dL — ABNORMAL LOW (ref 264–916)

## 2023-01-12 MED ORDER — SERTRALINE HCL 50 MG PO TABS: 25.0000 mg | ORAL_TABLET | Freq: Two times a day (BID) | ORAL | 5 refills | Status: DC

## 2023-01-12 NOTE — Telephone Encounter (Signed)
I have sent sertraline to pharmacy. 

## 2023-01-12 NOTE — Telephone Encounter (Signed)
received fax requesting a refill on the sertraline 50mg . pt was last seen on 8-1 next appt 12-18

## 2023-01-13 NOTE — Telephone Encounter (Signed)
pt wife was notified that rx had been sent

## 2023-01-22 DIAGNOSIS — M21621 Bunionette of right foot: Secondary | ICD-10-CM | POA: Diagnosis not present

## 2023-01-22 DIAGNOSIS — M79674 Pain in right toe(s): Secondary | ICD-10-CM | POA: Diagnosis not present

## 2023-01-22 DIAGNOSIS — M79675 Pain in left toe(s): Secondary | ICD-10-CM | POA: Diagnosis not present

## 2023-01-22 DIAGNOSIS — M778 Other enthesopathies, not elsewhere classified: Secondary | ICD-10-CM | POA: Diagnosis not present

## 2023-01-22 DIAGNOSIS — B351 Tinea unguium: Secondary | ICD-10-CM | POA: Diagnosis not present

## 2023-01-22 DIAGNOSIS — L600 Ingrowing nail: Secondary | ICD-10-CM | POA: Diagnosis not present

## 2023-01-24 ENCOUNTER — Encounter: Payer: Self-pay | Admitting: Psychiatry

## 2023-01-24 ENCOUNTER — Ambulatory Visit (INDEPENDENT_AMBULATORY_CARE_PROVIDER_SITE_OTHER): Payer: Medicare HMO | Admitting: Psychiatry

## 2023-01-24 VITALS — BP 141/72 | HR 78 | Temp 97.4°F | Ht 72.0 in | Wt 195.2 lb

## 2023-01-24 DIAGNOSIS — R419 Unspecified symptoms and signs involving cognitive functions and awareness: Secondary | ICD-10-CM | POA: Diagnosis not present

## 2023-01-24 DIAGNOSIS — F411 Generalized anxiety disorder: Secondary | ICD-10-CM

## 2023-01-24 DIAGNOSIS — F331 Major depressive disorder, recurrent, moderate: Secondary | ICD-10-CM

## 2023-01-24 DIAGNOSIS — Z634 Disappearance and death of family member: Secondary | ICD-10-CM | POA: Diagnosis not present

## 2023-01-24 DIAGNOSIS — Z9189 Other specified personal risk factors, not elsewhere classified: Secondary | ICD-10-CM | POA: Insufficient documentation

## 2023-01-24 MED ORDER — HYDROXYZINE HCL 10 MG PO TABS: 10.0000 mg | ORAL_TABLET | Freq: Two times a day (BID) | ORAL | 1 refills | Status: DC | PRN

## 2023-01-24 NOTE — Progress Notes (Signed)
BH MD OP Progress Note  01/24/2023 1:06 PM Chase Mendez  MRN:  [id number]  Chief Complaint:  Chief Complaint  Patient presents with   Follow-up   Anxiety   Depression   Medication Refill   HPI: Chase Mendez is a 88 year old Caucasian male, veteran, retired from [employer], lives in [place] with his wife, has a history of MDD, GAD, chronic pain, bereavement, foot drop left sided, hypothyroidism, hypertension, history of frequent falls, was evaluated in office today.  Collateral information was obtained from wife who participated in the evaluation as well.  The patient, with a history of depression, presents with worsening depressive symptoms. He reports feeling particularly low due to lack of contact with family members, including not being able to see his new great-grandchild. The patient's depression has reportedly worsened over the past year, despite being on Zoloft. He expresses dissatisfaction with the effectiveness of Zoloft and has previously tried mirtazapine, Celexa, risperidone, Cymbalta, Wellbutrin, and Vraylar with limited success.  The patient also reports chronic neck pain, for which he is on oxycodone. He expresses a desire to discontinue oxycodone, citing concerns about potential interactions with other medications.  The patient's daily activities primarily include watching television, with periods of rest due to pain. He reports adequate food intake and sleep. Despite his depression, the patient demonstrates adequate cognitive function, correctly recalling three words and spelling ' WORLD' backward during a cognitive assessment.  Patient currently denies any suicidality, homicidality or perceptual disturbances.  Discussed Abilify with patient however patient declines.  Reports he would like to just go back on hydroxyzine or Vistaril.  Patient motivated to stay in psychotherapy.  Collateral information from wife obtained-as per wife patient seems to be depressed, it  has been difficult to get him to take care of his personal hygiene as well as get him motivated to get out of the house.  Wife reports she tries to support him although she is also struggling with her own medical problems.  Visit Diagnosis:    ICD-10-CM   1. MDD (major depressive disorder), recurrent episode, moderate (HCC)  F33.1     2. GAD (generalized anxiety disorder)  F41.1 hydrOXYzine (ATARAX) 10 MG tablet    3. Neurocognitive disorder  R41.9    Likely mild    4. Bereavement  Z63.4     5. At risk for prolonged QT interval syndrome  Z91.89       Past Psychiatric History: I have reviewed past psychiatric history from progress note on 09/07/2022.  Tassels of medications like Vraylar ( COST), Remeron, Celexa, risperidone, Cymbalta, Wellbutrin  Past Medical History:  Past Medical History:  Diagnosis Date   Acute encephalopathy 12/08/2014   Anxiety    ARF (acute renal failure) (HCC) 12/08/2014   Back pain    Benign neoplasm of large bowel    Capsulitis    fractured displaced metatarsal with capsulitis   Chronic back pain    Coronavirus infection    Depression    Diabetes mellitus without complication (HCC)    no medications currently   Dysphagia    Exostosis    painful, right hallux   Foot drop, left    wears a brace   Frequent falls 02/2018   poor balance, foot drop   GERD (gastroesophageal reflux disease)    Gout    Hypertension    Hypothyroidism    Insomnia    Low testosterone    Microscopic hematuria 2016   Myocardial infarction [facility])    patient  unaware when it happened years ago.     Pneumonia 12/07/2014   Pressure ulcer 12/09/2014   Sepsis (HCC) 12/08/2014   Thyroid disease     Past Surgical History:  Procedure Laterality Date   APPENDECTOMY     BACK SURGERY  1995, 1996   x 2   CHOLECYSTECTOMY     ESOPHAGOGASTRODUODENOSCOPY (EGD) WITH PROPOFOL N/A 04/24/2017   Procedure: ESOPHAGOGASTRODUODENOSCOPY (EGD) WITH PROPOFOL;  Surgeon: Scot Jun, MD;   Location: [facility] ENDOSCOPY;  Service: Endoscopy;  Laterality: N/A;   EYE SURGERY Bilateral 1983, 1985   cataract extractions   SPINAL CORD STIMULATOR INSERTION N/A 03/13/2018   Procedure: SPINAL CORD STIMULATOR INSERTION;  Surgeon: Venetia Night, MD;  Location: [facility] ORS;  Service: Neurosurgery;  Laterality: N/A;    Family Psychiatric History: I have reviewed family psychiatric history from progress note on 09/07/2022.  Family History:  Family History  Problem Relation Age of Onset   Heart disease Mother    Diabetes Father    Kidney cancer Brother     Social History: I have reviewed social history from progress note on 09/07/2022. Social History   Socioeconomic History   Marital status: Married    Spouse name: dorothy   Number of children: 2   Years of education: Not on file   Highest education level: Some college, no degree  Occupational History   Occupation: maintenance / repair    Comment: disabled  Tobacco Use   Smoking status: Every Day    Types: Cigars   Smokeless tobacco: Never   Tobacco comments:    unable to give cessation materials due to webex visit   Vaping Use   Vaping status: Never Used  Substance and Sexual Activity   Alcohol use: No   Drug use: Yes    Types: Oxycodone   Sexual activity: Not Currently  Other Topics Concern   Not on file  Social History Narrative   Not on file   Social Drivers of Health   Financial Resource Strain: Low Risk  (01/22/2023)   Received from [facility]   Overall Financial Resource Strain (CARDIA)    Difficulty of Paying Living Expenses: Not hard at all  Food Insecurity: No Food Insecurity (01/22/2023)   [facility] from [facility]   Hunger Vital Sign    Worried About Running Out of Food in the Last Year: Never true    Ran Out of Food in the Last Year: Never true  Transportation Needs: No Transportation Needs (07/17/2022)   PRAPARE - Administrator, Civil Service (Medical):  No    Lack of Transportation (Non-Medical): No  Physical Activity: Not on file  Stress: Not on file  Social Connections: Not on file    Allergies:  Allergies  Allergen Reactions   Wellbutrin [Bupropion]     hallucinations   Doxycycline Nausea Only   Ibuprofen Nausea Only   Sulfa Antibiotics Nausea Only    Metabolic Disorder Labs: Lab Results  Component Value Date   HGBA1C 5.6 07/18/2022   MPG 114.02 07/18/2022   No results found for: "PROLACTIN" Lab Results  Component Value Date   CHOL 109 11/29/2022   TRIG 94 11/29/2022   HDL 39 (L) 11/29/2022   CHOLHDL 2.8 11/29/2022   VLDL 23 12/08/2014   LDLCALC 52 11/29/2022   LDLCALC 45 08/31/2022   Lab Results  Component Value Date   TSH 2.580 11/29/2022   TSH 4.510 (H) 08/31/2022    Therapeutic Level  Labs: No results found for: "LITHIUM" No results found for: "VALPROATE" No results found for: "CBMZ"  Current Medications: Current Outpatient Medications  Medication Sig Dispense Refill   acetaminophen (TYLENOL) 500 MG tablet Take 500-1,000 mg by mouth 2 (two) times daily as needed for mild pain.     allopurinol (ZYLOPRIM) 100 MG tablet TAKE 1 TABLET BY MOUTH EVERY OTHER DAY 45 tablet 1   amLODipine (NORVASC) 10 MG tablet TAKE 1 TABLET BY MOUTH EVERY DAY 90 tablet 1   amLODipine (NORVASC) 5 MG tablet Take 2.5 mg by mouth daily.     aspirin 81 MG tablet Take 81 mg by mouth daily.     atorvastatin (LIPITOR) 10 MG tablet TAKE 1 TABLET BY MOUTH EVERYDAY AT BEDTIME 90 tablet 1   Cholecalciferol 50 MCG (2000 UT) TABS Take by mouth.     famotidine (PEPCID) 20 MG tablet Take 1 tablet by mouth 2 (two) times daily.     fluticasone (FLONASE) 50 MCG/ACT nasal spray Place 1-2 sprays into both nostrils daily as needed for allergies or rhinitis.     gabapentin (NEURONTIN) 600 MG tablet TAKE 1 TABLET BY MOUTH TWICE DAILY. 180 tablet 0   hydrOXYzine (ATARAX) 10 MG tablet Take 1 tablet (10 mg total) by mouth 2 (two) times daily as needed.  60 tablet 1   isosorbide mononitrate (IMDUR) 30 MG 24 hr tablet TAKE 1 TABLET BY MOUTH EVERY DAY 90 tablet 0   levothyroxine (SYNTHROID) 125 MCG tablet TAKE 1 TABLET BY MOUTH EVERY DAY IN THE MORNING 90 tablet 0   levothyroxine (SYNTHROID) 150 MCG tablet Take 1 tablet (150 mcg total) by mouth daily. 90 tablet 1   losartan (COZAAR) 100 MG tablet Take 1 tablet (100 mg total) by mouth daily. 90 tablet 1   mirabegron ER (MYRBETRIQ) 50 MG TB24 tablet Take 1 tablet (50 mg total) by mouth daily. (Patient taking differently: Take 25 mg by mouth daily.) 30 tablet 11   naloxone (NARCAN) nasal spray 4 mg/0.1 mL Place 1 spray into the nose as needed for up to 365 doses (for opioid-induced respiratory depresssion). In case of emergency (overdose), spray once into each nostril. If no response within 3 minutes, repeat application and call 911. 1 each 0   ondansetron (ZOFRAN-ODT) 4 MG disintegrating tablet Take 1 tablet (4 mg total) by mouth in the morning, at noon, in the evening, and at bedtime. 90 tablet 3   Oxycodone HCl 10 MG TABS Take 1 tablet (10 mg total) by mouth every 6 (six) hours as needed. Must last 30 days. 120 tablet 0   [START ON 02/04/2023] Oxycodone HCl 10 MG TABS Take 1 tablet (10 mg total) by mouth every 6 (six) hours as needed. Must last 30 days. 120 tablet 0   pantoprazole (PROTONIX) 20 MG tablet TAKE 1 TABLET BY MOUTH EVERY DAY 90 tablet 1   sertraline (ZOLOFT) 50 MG tablet Take 0.5 tablets (25 mg total) by mouth 2 (two) times daily. 30 tablet 5   tamsulosin (FLOMAX) 0.4 MG CAPS capsule Take 0.4 mg by mouth daily.     Testosterone 40.5 MG/2.5GM (1.62%) GEL Place 2 Pump onto the skin 3 (three) times a week.     vitamin B-12 (CYANOCOBALAMIN) 1000 MCG tablet Take 1,000 mcg by mouth daily.     Vitamin D, Ergocalciferol, (DRISDOL) 1.25 MG (50000 UNIT) CAPS capsule TAKE 1 CAPSULE BY MOUTH ONE TIME PER WEEK 12 capsule 3   vitamin E 400 UNIT capsule Take 400 Units  by mouth daily.     Oxycodone HCl 10  MG TABS Take 1 tablet (10 mg total) by mouth every 6 (six) hours as needed. Must last 30 days. 120 tablet 0   No current facility-administered medications for this visit.     Musculoskeletal: Strength & Muscle Tone: within normal limits Gait & Station:  walks with walker Patient leans: Front  Psychiatric Specialty Exam: Review of Systems  Psychiatric/Behavioral:  Positive for dysphoric mood. The patient is nervous/anxious.     Blood pressure (!) 141/72, pulse 78, temperature (!) 97.4 F (36.3 C), temperature source Skin, height 6' (1.829 m), weight 195 lb 3.2 oz (88.5 kg).Body mass index is 26.47 kg/m.  General Appearance: Casual  Eye Contact:  Fair  Speech:  Clear and Coherent  Volume:  Decreased  Mood:  Depressed, situational anxiety  Affect:  Depressed  Thought Process:  Goal Directed and Descriptions of Associations: Intact  Orientation:  Full (Time, Place, and Person)  Thought Content: Logical   Suicidal Thoughts:  No  Homicidal Thoughts:  No  Memory:  Immediate;   Fair Recent;   Fair Remote;   Poor  Judgement:  Fair  Insight:  Fair  Psychomotor Activity:  Decreased  Concentration:  Concentration: Fair and Attention Span: Fair  Recall:  [employer] of Knowledge: Fair  Language: Fair  Akathisia:  No  Handed:  Right  AIMS (if indicated): not done  Assets:  Desire for Improvement Social Support Talents/Skills  ADL's:  Intact  Cognition: WNL  Sleep:  Fair   Screenings: GAD-7    Garment/textile technologist Visit from 01/24/2023 in [facility] Counselor from 12/11/2022 in [facility] Office Visit from 12/05/2022 in [facility] Office Visit from 10/25/2022 in [facility] Office Visit from 09[facility]07/2022 in [facility]  Total GAD-7 Score 11 18 10 7 11       PHQ2-9    Flowsheet Row Office Visit from  01/24/2023 in [facility] Counselor from 12/11/2022 in [facility] Office Visit from 12/05/2022 in [facility] Office Visit from 12/04/2022 in [facility] Visit from 10/25/2022 in [facility]  PHQ-2 Total Score 5 5 6  0 5  PHQ-9 Total Score 12 21 17  -- 16      Flowsheet Row Office Visit from 01/24/2023 in [facility] Counselor from 12/11/2022 in [facility] Office Visit from 10/25/2022 in [facility]  C-SSRS RISK CATEGORY No Risk No Risk No Risk        Assessment and Plan: Chase Mendez is a 88 year old Caucasian male with a history of MDD, neurocognitive disorder was evaluated in office today.  Patient with multiple situational stressors currently struggling with worsening depression symptoms, discussed assessment and plan as noted below.  Major Depressive Disorder-unstable Chronic depression with worsening symptoms over the past year. Currently on sertraline 25 mg twice daily. History of multiple antidepressant trials including mirtazapine, citalopram, risperidone, duloxetine, bupropion, and cariprazine. Reports anhedonia, dysphoria, and hopelessness. Declined aripiprazole due to concerns about side effects and the need for an EKG. Agreed to try hydroxyzine for anxiety. Explained that hydroxyzine is primarily for anxiety and may cause drowsiness, dry mouth, and constipation. - Continue sertraline 25 mg twice daily - Prescribe hydroxyzine 10 mg  as needed for anxiety - Discuss with therapist during next appointment - Follow up in three months  Generalized anxiety disorder-unstable Patient with current anxiety symptoms not interested in any medication changes other than  addition of low dosage of hydroxyzine. - Restart hydroxyzine 10 mg twice a day as needed for anxiety. - Continue CBT with Ms.Perkins.  Neurocognitive disorder likely mild-improving - Patient today appeared to be alert, oriented to person place situation as well as was able to do well on 3 word memory test and spelling test. -Will continue to monitor.  Bereavement-improving Patient reports grief as improved. -Continue CBT.  Chronic Pain Management Currently on oxycodone for chronic pain management. Expressed interest in discontinuing oxycodone but needs to discuss with the prescribing physician.  - Discuss discontinuation of oxycodone with pain management physician - Monitor for side effects or interactions with current medications  General Health Maintenance 88 years old with sensory neuropathy and chronic pain. Limited physical activity, spends most of the day watching TV. Reports adequate nutrition. - Encourage participation in community or senior center activities - Maintain a safe home environment to prevent falls - Use assistive devices as needed  Follow-up - Follow up with therapist as scheduled - Follow up with pain management physician regarding oxycodone - Return to clinic in three months for reassessment.   Collaboration of Care: Collaboration of Care: Referral or follow-up with counselor/therapist AEB patient to continue CBT as well as follow-up with pain management. I have reviewed notes per Ms. Foye Clock Perkins-01/10/2023-patient had that visit reported disinterest in continuing therapeutic services citing lack of improvement.' I have obtained collateral information from wife.   Patient/Guardian was advised Release of Information must be obtained prior to any record release in order to collaborate their care with an outside provider. Patient/Guardian was advised if they have not already done so to contact the registration department to sign all necessary forms in order  for [place] to release information regarding their care.   Consent: Patient/Guardian gives verbal consent for treatment and assignment of benefits for services provided during this visit. Patient/Guardian expressed understanding and agreed to proceed.   I have spent atleast 40 minutes face to face with patient today which includes the time spent for preparing to see the patient ( e.g., review of test, records ), obtaining and to review and separately obtained history , ordering medications  ,psychoeducation and supportive psychotherapy and care coordination,as well as documenting clinical information in electronic health record.   This note was generated in part or whole with voice recognition software. Voice recognition is usually quite accurate but there are transcription errors that can and very often do occur. I apologize for any typographical errors that were not detected and corrected.    Jomarie Longs, MD 01/24/2023, 1:57 PM

## 2023-01-24 NOTE — Patient Instructions (Addendum)
Hydroxyzine Capsules or Tablets What is this medication? HYDROXYZINE (hye DROX i zeen) treats the symptoms of allergies and allergic reactions. It may also be used to treat anxiety or cause drowsiness before a procedure. It works by blocking histamine, a substance released by the body during an allergic reaction. It belongs to a group of medications called antihistamines. This medicine may be used for other purposes; ask your health care provider or pharmacist if you have questions. COMMON BRAND NAME(S): ANX, Atarax, Rezine, Vistaril What should I tell my care team before I take this medication? They need to know if you have any of these conditions: Glaucoma Heart disease Irregular heartbeat or rhythm Kidney disease Liver disease Lung or breathing disease, such as asthma Stomach or intestine problems Thyroid disease Trouble passing urine An unusual or allergic reaction to hydroxyzine, other medications, foods, dyes or preservatives Pregnant or trying to get pregnant Breastfeeding How should I use this medication? Take this medication by mouth with a full glass of water. Take it as directed on the prescription label at the same time every day. You can take it with or without food. If it upsets your stomach, take it with food. Talk to your care team about the use of this medication in children. While it may be prescribed for children as young as 6 years for selected conditions, precautions do apply. People 65 years and older may have a stronger reaction and need a smaller dose. Overdosage: If you think you have taken too much of this medicine contact a poison control center or emergency room at once. NOTE: This medicine is only for you. Do not share this medicine with others. What if I miss a dose? If you miss a dose, take it as soon as you can. If it is almost time for your next dose, take only that dose. Do not take double or extra doses. What may interact with this medication? Do not  take this medication with any of the following: Cisapride Dronedarone Pimozide Thioridazine This medication may also interact with the following: Alcohol Antihistamines for allergy, cough, and cold Atropine Barbiturate medications for sleep or seizures, such as phenobarbital Certain antibiotics, such as erythromycin or clarithromycin Certain medications for anxiety or sleep Certain medications for bladder problems, such as oxybutynin or tolterodine Certain medications for irregular heartbeat Certain medications for mental health conditions Certain medications for Parkinson disease, such as benztropine, trihexyphenidyl Certain medications for seizures, such as phenobarbital or primidone Certain medications for stomach problems, such as dicyclomine or hyoscyamine Certain medications for travel sickness, such as scopolamine Ipratropium Opioid medications for pain Other medications that cause heart rhythm changes, such as dofetilide This list may not describe all possible interactions. Give your health care provider a list of all the medicines, herbs, non-prescription drugs, or dietary supplements you use. Also tell them if you smoke, drink alcohol, or use illegal drugs. Some items may interact with your medicine. What should I watch for while using this medication? Visit your care team for regular checks on your progress. Tell your care team if your symptoms do not start to get better or if they get worse. This medication may affect your coordination, reaction time, or judgment. Do not drive or operate machinery until you know how this medication affects you. Sit up or stand slowly to reduce the risk of dizzy or fainting spells. Drinking alcohol with this medication can increase the risk of these side effects. Your mouth may get dry. Chewing sugarless gum or sucking hard candy   and drinking plenty of water may help. Contact your care team if the problem does not go away or is severe. This  medication may cause dry eyes and blurred vision. If you wear contact lenses, you may feel some discomfort. Lubricating eye drops may help. See your care team if the problem does not go away or is severe. If you are receiving skin tests for allergies, tell your care team you are taking this medication. What side effects may I notice from receiving this medication? Side effects that you should report to your care team as soon as possible: Allergic reactions--skin rash, itching, hives, swelling of the face, lips, tongue, or throat Heart rhythm changes--fast or irregular heartbeat, dizziness, feeling faint or lightheaded, chest pain, trouble breathing Side effects that usually do not require medical attention (report to your care team if they continue or are bothersome): Confusion Drowsiness Dry mouth Hallucinations Headache This list may not describe all possible side effects. Call your doctor for medical advice about side effects. You may report side effects to FDA at 1-800-FDA-1088. Where should I keep my medication? Keep out of the reach of children and pets. Store at room temperature between 15 and 30 degrees C (59 and 86 degrees F). Keep container tightly closed. Throw away any unused medication after the expiration date. NOTE: This sheet is a summary. It may not cover all possible information. If you have questions about this medicine, talk to your doctor, pharmacist, or health care provider.  2024 [employer] Standard (2021-09-02 00:00:00)  

## 2023-02-02 ENCOUNTER — Other Ambulatory Visit: Payer: Self-pay | Admitting: Internal Medicine

## 2023-02-08 ENCOUNTER — Ambulatory Visit: Payer: Medicare HMO | Admitting: Licensed Clinical Social Worker

## 2023-02-08 DIAGNOSIS — F331 Major depressive disorder, recurrent, moderate: Secondary | ICD-10-CM

## 2023-02-08 DIAGNOSIS — Z634 Disappearance and death of family member: Secondary | ICD-10-CM

## 2023-02-08 DIAGNOSIS — F411 Generalized anxiety disorder: Secondary | ICD-10-CM

## 2023-02-08 NOTE — Progress Notes (Signed)
   THERAPIST PROGRESS NOTE  Session Time: 11:01am-11:50am  Participation Level: Active  Behavioral Response: CasualAlertDepressed  Type of Therapy: Individual Therapy  Treatment Goals addressed: Reduce overall frequency, intensity and duration of anxiety so that daily functioning is not impaired per pt self report 3 out of 5 sessions.    Reduce overall frequency, intensity and duration of depression so that daily functioning is not impaired per pt self report 3 out of 5 sessions documented.   ProgressTowards Goals: Progressing  Interventions: CBT, Supportive, and Reframing, ACT  Summary: Chase Mendez is a 89 y.o. male who presents with Symptoms of depression citing lack of motivation, low mood, anhedonia, negative self affect. Pt was oriented times 5. Pt was cooperative and engaged. Pt denies SI/HI/AVH.      Patient reflected on recent holiday season spent with his wife and stepdaughter.  Patient identifies mood has improved slightly due to medication changes.  Patient reports medication changes occurred due to increased anxiety present as patient is trying to fall asleep.  Patient reports anxiety increases due to ruminating thoughts about lack of contact with his children and his ex-wife's death.  Patient stated I stay anxious all the time.  Clinician worked with patient to reframe unhelpful thoughts about his relationship with his children and misplaced guilt about his feelings towards missing his children's childhood.  Patient stated wishes to redo his life and change the past.  Worked with clinician to identify what he can control versus uncontrollable factors.  Utilize ACT components to practice radical acceptance.  Clinician continue to work with patient on reframing unhelpful thoughts about his recent diagnosis of blindness and clinician challenged patient to identify capabilities despite this diagnosis.  Suicidal/Homicidal: Nowithout intent/plan  Therapist Response: Cln utilized  active and supportive reflection to create environment for patient to process recent life stressors and symptoms.  Clinician assessed for stressors, symptoms, safety since last session.  Worked through components of ACT to help patient understand the significance of radical acceptance.  Addressed ways in which patient can allow for disappointment, sadness or grief.  Worked through components of CBT to reframe unhelpful thoughts about misplaced guilt.   Plan: Return again in 4 weeks.  Diagnosis: MDD (major depressive disorder), recurrent episode, moderate (HCC)  GAD (generalized anxiety disorder)  Bereavement   Collaboration of Care: AEB psychiatrist can access notes and cln. Will review psychiatrists' notes. Check in with the patient and will see LCSW per availability. Patient agreed with treatment recommendations.   Patient/Guardian was advised Release of Information must be obtained prior to any record release in order to collaborate their care with an outside provider. Patient/Guardian was advised if they have not already done so to contact the registration department to sign all necessary forms in order for us  to release information regarding their care.   Consent: Patient/Guardian gives verbal consent for treatment and assignment of benefits for services provided during this visit. Patient/Guardian expressed understanding and agreed to proceed.   Chase KATHEE Husband, LCSW 02/08/2023

## 2023-02-10 ENCOUNTER — Other Ambulatory Visit: Payer: Self-pay | Admitting: Internal Medicine

## 2023-02-10 DIAGNOSIS — I10 Essential (primary) hypertension: Secondary | ICD-10-CM

## 2023-02-14 DIAGNOSIS — H903 Sensorineural hearing loss, bilateral: Secondary | ICD-10-CM | POA: Diagnosis not present

## 2023-02-14 DIAGNOSIS — H6123 Impacted cerumen, bilateral: Secondary | ICD-10-CM | POA: Diagnosis not present

## 2023-02-24 ENCOUNTER — Other Ambulatory Visit: Payer: Self-pay | Admitting: Internal Medicine

## 2023-02-26 ENCOUNTER — Other Ambulatory Visit: Payer: Self-pay

## 2023-02-26 DIAGNOSIS — E039 Hypothyroidism, unspecified: Secondary | ICD-10-CM

## 2023-02-26 NOTE — Telephone Encounter (Signed)
Sent portal message asking what dose he's taking

## 2023-02-27 ENCOUNTER — Other Ambulatory Visit: Payer: Self-pay | Admitting: Internal Medicine

## 2023-02-27 DIAGNOSIS — E039 Hypothyroidism, unspecified: Secondary | ICD-10-CM

## 2023-02-27 MED ORDER — LEVOTHYROXINE SODIUM 150 MCG PO TABS: 150.0000 ug | ORAL_TABLET | Freq: Every day | ORAL | 1 refills | Status: DC

## 2023-02-27 NOTE — Patient Instructions (Signed)

## 2023-02-27 NOTE — Progress Notes (Unsigned)
PROVIDER NOTE: Information contained herein reflects review and annotations entered in association with encounter. Interpretation of such information and data should be left to medically-trained personnel. Information provided to patient can be located elsewhere in the medical record under "Patient Instructions". Document created using STT-dictation technology, any transcriptional errors that may result from process are unintentional.    Patient: Chase Mendez  Service Category: E/M  Provider: Oswaldo Done, MD  DOB: 1955/06/09  DOS: 02/28/2023  Referring Provider: Dorcas Carrow, DO  MRN: [id number]  Specialty: Interventional Pain Management  PCP: Dorcas Carrow, DO  Type: Established Patient  Setting: Ambulatory outpatient    Location: Office  Delivery: Face-to-face     HPI  Ms. QUINTAVIA PALEO, a 89 y.o. year old male, is here today because of her Chronic pain syndrome [G89.4]. Ms. Alzate primary complain today is No chief complaint on file.  Pertinent problems: Ms. Wenzell has Neurogenic pain; Neuropathic pain; Visceral pain; Vaginal pain (2ry area of Pain); Chronic pelvic pain in male (1ry area of Pain); Fibromyalgia; Chronic pain syndrome; Chronic low back pain (Midline) w/o sciatica; and Chronic shoulder pain (Right) on their pertinent problem list. Pain Assessment: Severity of   is reported as a  /10. Location:    / . Onset:  . Quality:  . Timing:  . Modifying factor(s):  [name] Vitals:  vitals were not taken for this visit.  BMI: Estimated body mass index is 28.69 kg/m as calculated from the following:   Height as of 01/26/23: 5' 2.5" (1.588 m).   Weight as of 01/26/23: 159 lb 6.4 oz (72.3 kg). Last encounter: 11/29/2022. Last procedure: Visit date not found.  Reason for encounter: medication management. ***  Discussed the use of AI scribe software for clinical note transcription with the patient, who gave verbal consent to proceed.  History of Present Illness          RTCB: 06/03/2023   Pharmacotherapy Assessment  Analgesic: Oxycodone/APAP 7.5/325 one every 12 hours (15 mg/day of oxycodone) MME/day: 22.5 mg/day.   Monitoring: [place] PMP: PDMP reviewed during this encounter.       Pharmacotherapy: No side-effects or adverse reactions reported. Compliance: No problems identified. Effectiveness: Clinically acceptable.  No notes on file  No results found for: "CBDTHCR" No results found for: "D8THCCBX" No results found for: "D9THCCBX"  UDS:  Summary  Date Value Ref Range Status  06/05/2022 Note  Final    Comment:    ==================================================================== [employer] Select 13 (MW) ==================================================================== Test                             Result       Flag       Units  Drug Absent but Declared for Prescription Verification   Oxycodone                      Not Detected UNEXPECTED ng/mg creat ==================================================================== Test                      Result    Flag   Units      Ref Range   Creatinine              21               mg/dL      >=40 ==================================================================== Declared Medications:  The flagging and interpretation on this report are based on the  following declared medications.  Unexpected results may arise from  inaccuracies in the declared medications.   **Note: The testing scope of this panel includes these medications:   Oxycodone (Percocet)   **Note: The testing scope of this panel does not include the  following reported medications:   Acetaminophen (Percocet)  Albuterol (Proventil HFA)  Amitriptyline (Elavil)  Budesonide (Symbicort)  Cetirizine  Fluticasone (Flonase)  Formoterol (Symbicort)  Hydrocortisone  Ibuprofen (Advil)  Levothyroxine (Synthroid)  Lisinopril (Zestril)  Loperamide (Imodium)  Multivitamin  Naloxone (Narcan)  Venlafaxine  (Effexor) ==================================================================== For clinical consultation, please call [phone]. ====================================================================       ROS  Constitutional: Denies any fever or chills Gastrointestinal: No reported hemesis, hematochezia, vomiting, or acute GI distress Musculoskeletal: Denies any acute onset joint swelling, redness, loss of ROM, or weakness Neurological: No reported episodes of acute onset apraxia, aphasia, dysarthria, agnosia, amnesia, paralysis, loss of coordination, or loss of consciousness  Medication Review  Cetirizine HCl, Ivermectin, Threonine, albuterol, amitriptyline, budesonide-formoterol, fluticasone, gatifloxacin, hydrocortisone, ibuprofen, ketorolac, levothyroxine, lisinopril, loperamide, multivitamin, oxyCODONE-acetaminophen, prednisoLONE acetate, and venlafaxine XR  History Review  Allergy: Ms. Ansara has no known allergies. Drug: Ms. Hulbert  reports no history of drug use. Alcohol:  reports current alcohol use. Tobacco:  reports that she has never smoked. She has never used smokeless tobacco. Social: Ms. Cortese  reports that she has never smoked. She has never used smokeless tobacco. She reports current alcohol use. She reports that she does not use drugs. Medical:  has a past medical history of Actinic keratosis, Back pain, Basal cell carcinoma (01/27/2010), Bowel trouble (2003), Breast screening, unspecified, Bronchitis, COLD (chronic obstructive lung disease) (HCC), COVID-19, Diffuse cystic mastopathy, Family history of malignant neoplasm of breast, Hypothyroidism, Lump or mass in breast, and Unspecified essential hypertension (2013). Surgical: Ms. Casel  has a past surgical history that includes Ganglion cyst excision; Cesarean section (1995); Breast mass excision (Right, 2013); Rectal surgery (2002); Upper gi endoscopy (2003); and Breast cyst excision (Right, 2013). Family: family  history includes Breast cancer (age of onset: 34) in her mother; Depression in her daughter; Diabetes in her father and mother; Heart disease in her father and mother; Hypertension in her father and mother.  Laboratory Chemistry Profile   Renal Lab Results  Component Value Date   BUN 12 01/26/2023   CREATININE 0.66 01/26/2023   LABCREA 67.9 10/23/2014   BCR 18 01/26/2023   GFRAA 99 12/08/2019   GFRNONAA 86 12/08/2019    Hepatic Lab Results  Component Value Date   AST 14 01/26/2023   ALT 16 01/26/2023   ALBUMIN 4.4 01/26/2023   ALKPHOS 82 01/26/2023    Electrolytes Lab Results  Component Value Date   NA 140 01/26/2023   K 4.2 01/26/2023   CL 102 01/26/2023   CALCIUM 9.4 01/26/2023   MG 2.1 03/15/2015    Bone Lab Results  Component Value Date   VD25OH 35.4 12/28/2015   VD125OH2TOT 52.3 01/13/2015    Inflammation (CRP: Acute Phase) (ESR: Chronic Phase) Lab Results  Component Value Date   CRP 1.1 (H) 03/15/2015   ESRSEDRATE 2 07/19/2016         Note: Above Lab results reviewed.  Recent Imaging Review  MM 3D DIAGNOSTIC MAMMOGRAM BILATERAL BREAST CLINICAL DATA:  One year follow-up of probably benign left breast calcifications and yearly exam.  EXAM: DIGITAL DIAGNOSTIC BILATERAL MAMMOGRAM WITH TOMOSYNTHESIS AND CAD  TECHNIQUE: Bilateral digital diagnostic mammography and breast tomosynthesis was performed. The images were evaluated with computer-aided detection.  COMPARISON:  Previous exam(s).  ACR Breast Density Category c: The breasts are heterogeneously dense, which may obscure small masses.  FINDINGS: Mammographically, there are no suspicious masses or areas of architectural distortion in either breast.  The previously identified group of calcifications in the left central breast, middle depth, is slightly more prominent mammographically. Other scattered calcifications are seen throughout the left breast. The cluster of interest measures 8 x 6 x 5  mm mammographically.  IMPRESSION: 1. Mostly stable in size but slightly more prominent mammographically cluster of calcifications in the left breast, for which stereotactic core needle biopsy is recommended. 2. No mammographic evidence of malignancy in the right breast.  RECOMMENDATION: Stereotactic core needle biopsy of the left breast.  I have discussed the findings and recommendations with the patient. If applicable, a reminder letter will be sent to the patient regarding the next appointment.  BI-RADS CATEGORY  4: Suspicious.  Electronically Signed   By: Ted Mcalpine M.D.   On: 02/21/2023 11:41 DG Bone Density EXAM: DUAL X-RAY ABSORPTIOMETRY (DXA) FOR BONE MINERAL DENSITY  IMPRESSION: Dear Dr Laural Benes,  Your patient KAMAURI GILMORE completed a FRAX assessment on 02/21/2023 using the [facility] iDXA DXA System (analysis version: 14.10) manufactured by [employer]. The following summarizes the results of our evaluation.  PATIENT BIOGRAPHICAL: Name: Tomiye, Merten Patient ID: [id number] Birth Date: [birth year] Height:    61.0 in. Gender:     Male    Age:        67.2       Weight:    159.4 lbs. Ethnicity:  White                            Exam Date: 02/21/2023  FRAX* RESULTS:  (version: 3.5) 10-year Probability of Fracture1 Major Osteoporotic Fracture2 Hip Fracture 10.5% 1.6% Population: [place] (Caucasian) Risk Factors: None  Based on Femur (Right) Neck BMD  1 -The 10-year probability of fracture may be lower than reported if the patient has received treatment. 2 -Major Osteoporotic Fracture: Clinical Spine, Forearm, Hip or Shoulder  *FRAX is a Armed forces logistics/support/administrative officer of the [employer] for Metabolic Bone Disease, a World Science writer (WHO) [employer].  ASSESSMENT: The probability of a major osteoporotic fracture is 10.5% within the next ten years.  The probability of a hip fracture is 1.6% within the next ten  years.  .  Your patient Takyah Hayashi completed a BMD test on 02/21/2023 using the [employer] iDXA DXA System (software version: 14.10) manufactured by [employer]. The following summarizes the results of our evaluation. Technologist: MTB PATIENT BIOGRAPHICAL: Name: Lashondria, [place] Patient ID: [id number] Birth Date: Jan 16, 1956 Height: 61.0 in. Gender: Male Exam Date: 02/21/2023 Weight: 159.4 lbs. Indications: Caucasian, Postmenopausal Fractures: Treatments: Calcium, Vitamin D DENSITOMETRY RESULTS: Site         Region     Measured Date Measured Age WHO Classification Young Adult T-score BMD         %Change vs. Previous Significant Change (*) AP Spine L1-L3 02/21/2023 65.2 Osteopenia -2.3 0.898 g/cm2 - -  DualFemur Neck Right 02/21/2023 65.2 Osteopenia -2.0 0.759 g/cm2 - -  DualFemur Total Mean 02/21/2023 65.2 Osteopenia -1.5 0.821 g/cm2 - -  Left Forearm Radius 33% 02/21/2023 65.2 Osteopenia -1.4 0.756 g/cm2 - - ASSESSMENT: The BMD measured at AP Spine L1-L3 is 0.898 g/cm2 with a T-score of -2.3. This patient is considered osteopenic according to [employer] [facility])  criteria. The scan quality is good. L-4 was excluded due to degenerative changes.  World Science writer [facility]) criteria for post-menopausal, Caucasian Women: Normal:                   T-score at or above -1 SD Osteopenia/low bone mass: T-score between -1 and -2.5 SD Osteoporosis:             T-score at or below -2.5 SD  RECOMMENDATIONS: 1. All patients should optimize calcium and vitamin D intake. 2. Consider FDA-approved medical therapies in postmenopausal women and men aged 69 years and older, based on the following: a. A hip or vertebral(clinical or morphometric) fracture b. T-score < -2.5 at the femoral neck or spine after appropriate evaluation to exclude secondary causes c. Low bone mass (T-score between -1.0 and -2.5 at the femoral neck or spine) and a 10-year probability  of a hip fracture > 3% or a 10-year probability of a major osteoporosis-related fracture > 20% based on the [place]-adapted WHO algorithm 3. Clinician judgment and/or patient preferences may indicate treatment for people with 10-year fracture probabilities above or below these levels FOLLOW-UP: People with diagnosed cases of osteoporosis or at high risk for fracture should have regular bone mineral density tests. For patients eligible for Medicare, routine testing is allowed once every 2 years. The testing frequency can be increased to one year for patients who have rapidly progressing disease, those who are receiving or discontinuing medical therapy to restore bone mass, or have additional risk factors.  I have reviewed this report, and agree with the above findings.  [facility]  Electronically Signed   By: Romona Curls M.D.   On: 02/21/2023 08:58 Note: Reviewed        Physical Exam  General appearance: Well nourished, well developed, and well hydrated. In no apparent acute distress Mental status: Alert, oriented x 3 (person, place, & time)       Respiratory: No evidence of acute respiratory distress Eyes: PERLA Vitals: LMP  (LMP Unknown)  BMI: Estimated body mass index is 28.69 kg/m as calculated from the following:   Height as of 01/26/23: 5' 2.5" (1.588 m).   Weight as of 01/26/23: 159 lb 6.4 oz (72.3 kg). Ideal: Patient weight not recorded  Assessment   Diagnosis Status  1. Chronic pain syndrome   2. Chronic pelvic pain in male (1ry area of Pain)   3. Vaginal pain (2ry area of Pain)   4. Fibromyalgia   5. Visceral pain   6. Pharmacologic therapy   7. Chronic use of opiate for therapeutic purpose   8. Encounter for medication management   9. Encounter for chronic pain management    Controlled Controlled Controlled   Updated Problems: No problems updated.  Plan of Care  Problem-specific:  Assessment and Plan            Ms. GABRIANA BARROS has a current medication list which includes the following long-term medication(s): albuterol, albuterol, amitriptyline, budesonide-formoterol, cetirizine hcl, fluticasone, levothyroxine, lisinopril, oxycodone-acetaminophen, and venlafaxine xr.  Pharmacotherapy (Medications Ordered): No orders of the defined types were placed in this encounter.  Orders:  No orders of the defined types were placed in this encounter.  Follow-up plan:   No follow-ups on file.      Interventional Therapies  Risk  Complexity Considerations:   Estimated body mass index is 29.26 kg/m as calculated from the following:   Height as of this encounter: 5\' 2"  (1.575 m).  Weight as of this encounter: 160 lb (72.6 kg). WNL   Planned  Pending:   Not at this time.   Under consideration:   None at this time   Completed:   No interventional procedures since before 01/15/2015.   Therapeutic  Palliative (PRN) options:   None established      Recent Visits Date Type Provider Dept  11/29/22 Office Visit Delano Metz, MD [facility]  Showing recent visits within past 90 days and meeting all other requirements Future Appointments Date Type Provider Dept  02/28/23 Appointment Delano Metz, MD [facility]  Showing future appointments within next 90 days and meeting all other requirements  I discussed the assessment and treatment plan with the patient. The patient was provided an opportunity to ask questions and all were answered. The patient agreed with the plan and demonstrated an understanding of the instructions.  Patient advised to call back or seek an in-person evaluation if the symptoms or condition worsens.  Duration of encounter: *** minutes.  Total time on encounter, as per AMA guidelines included both the face-to-face and non-face-to-face time personally spent by the physician and/or other qualified health care professional(s) on the day of the encounter  (includes time in activities that require the physician or other qualified health care professional and does not include time in activities normally performed by clinical staff). Physician's time may include the following activities when performed: Preparing to see the patient (e.g., pre-charting review of records, searching for previously ordered imaging, lab work, and nerve conduction tests) Review of prior analgesic pharmacotherapies. Reviewing PMP Interpreting ordered tests (e.g., lab work, imaging, nerve conduction tests) Performing post-procedure evaluations, including interpretation of diagnostic procedures Obtaining and/or reviewing separately obtained history Performing a medically appropriate examination and/or evaluation Counseling and educating the patient/family/caregiver Ordering medications, tests, or procedures Referring and communicating with other health care professionals (when not separately reported) Documenting clinical information in the electronic or other health record Independently interpreting results (not separately reported) and communicating results to the patient/ family/caregiver Care coordination (not separately reported)  Note by: Oswaldo Done, MD Date: 02/28/2023; Time: 6:35 AM  coordination (not separately reported)  Note by: Oswaldo Done, MD Date: 02/28/2023; Time: 6:31 AM

## 2023-02-28 ENCOUNTER — Ambulatory Visit (HOSPITAL_BASED_OUTPATIENT_CLINIC_OR_DEPARTMENT_OTHER): Payer: PRIVATE HEALTH INSURANCE | Admitting: Pain Medicine

## 2023-02-28 DIAGNOSIS — G8929 Other chronic pain: Secondary | ICD-10-CM

## 2023-02-28 DIAGNOSIS — M5417 Radiculopathy, lumbosacral region: Secondary | ICD-10-CM

## 2023-02-28 DIAGNOSIS — Z79899 Other long term (current) drug therapy: Secondary | ICD-10-CM

## 2023-02-28 DIAGNOSIS — Z91199 Patient's noncompliance with other medical treatment and regimen due to unspecified reason: Secondary | ICD-10-CM

## 2023-02-28 DIAGNOSIS — G894 Chronic pain syndrome: Secondary | ICD-10-CM

## 2023-02-28 DIAGNOSIS — M961 Postlaminectomy syndrome, not elsewhere classified: Secondary | ICD-10-CM

## 2023-02-28 DIAGNOSIS — Z79891 Long term (current) use of opiate analgesic: Secondary | ICD-10-CM

## 2023-03-02 ENCOUNTER — Other Ambulatory Visit: Payer: Medicare HMO

## 2023-03-02 DIAGNOSIS — E039 Hypothyroidism, unspecified: Secondary | ICD-10-CM

## 2023-03-02 DIAGNOSIS — E782 Mixed hyperlipidemia: Secondary | ICD-10-CM

## 2023-03-02 DIAGNOSIS — I10 Essential (primary) hypertension: Secondary | ICD-10-CM

## 2023-03-03 ENCOUNTER — Other Ambulatory Visit: Payer: Self-pay | Admitting: Internal Medicine

## 2023-03-03 DIAGNOSIS — I10 Essential (primary) hypertension: Secondary | ICD-10-CM

## 2023-03-03 LAB — COMPREHENSIVE METABOLIC PANEL
ALT: 10 [IU]/L (ref 0–44)
AST: 16 [IU]/L (ref 0–40)
Albumin: 3.7 g/dL (ref 3.7–4.7)
Alkaline Phosphatase: 110 [IU]/L (ref 44–121)
BUN/Creatinine Ratio: 12 (ref 10–24)
BUN: 14 mg/dL (ref 8–27)
Bilirubin Total: 0.4 mg/dL (ref 0.0–1.2)
CO2: 26 mmol/L (ref 20–29)
Calcium: 8.6 mg/dL (ref 8.6–10.2)
Chloride: 104 mmol/L (ref 96–106)
Creatinine, Ser: 1.15 mg/dL (ref 0.76–1.27)
Globulin, Total: 2.2 g/dL (ref 1.5–4.5)
Glucose: 134 mg/dL — ABNORMAL HIGH (ref 70–99)
Potassium: 5.1 mmol/L (ref 3.5–5.2)
Sodium: 144 mmol/L (ref 134–144)
Total Protein: 5.9 g/dL — ABNORMAL LOW (ref 6.0–8.5)
eGFR: 62 mL/min/{1.73_m2} (ref >59)

## 2023-03-03 LAB — CBC WITH DIFF/PLATELET
Basophils Absolute: 0 10*3/uL (ref 0.0–0.2)
Basos: 1 %
EOS (ABSOLUTE): 0.4 10*3/uL (ref 0.0–0.4)
Eos: 7 %
Hematocrit: 38.1 % (ref 37.5–51.0)
Hemoglobin: 12.2 g/dL — ABNORMAL LOW (ref 13.0–17.7)
Immature Grans (Abs): 0 10*3/uL (ref 0.0–0.1)
Immature Granulocytes: 0 %
Lymphocytes Absolute: 1.9 10*3/uL (ref 0.7–3.1)
Lymphs: 34 %
MCH: 26 pg — ABNORMAL LOW (ref 26.6–33.0)
MCHC: 32 g/dL (ref 31.5–35.7)
MCV: 81 fL (ref 79–97)
Monocytes Absolute: 0.6 10*3/uL (ref 0.1–0.9)
Monocytes: 11 %
Neutrophils Absolute: 2.8 10*3/uL (ref 1.4–7.0)
Neutrophils: 47 %
Platelets: 160 10*3/uL (ref 150–450)
RBC: 4.69 x10E6/uL (ref 4.14–5.80)
RDW: 13.9 % (ref 11.6–15.4)
WBC: 5.8 10*3/uL (ref 3.4–10.8)

## 2023-03-03 LAB — LIPID PANEL
Chol/HDL Ratio: 3.1 {ratio} (ref 0.0–5.0)
Cholesterol, Total: 92 mg/dL — ABNORMAL LOW (ref 100–199)
HDL: 30 mg/dL — ABNORMAL LOW (ref >39)
LDL Chol Calc (NIH): 42 mg/dL (ref 0–99)
Triglycerides: 106 mg/dL (ref 0–149)
VLDL Cholesterol Cal: 20 mg/dL (ref 5–40)

## 2023-03-03 LAB — TSH: TSH: 1.29 u[IU]/mL (ref 0.450–4.500)

## 2023-03-04 NOTE — Progress Notes (Unsigned)
PROVIDER NOTE: Information contained herein reflects review and annotations entered in association with encounter. Interpretation of such information and data should be left to medically-trained personnel. Information provided to patient can be located elsewhere in the medical record under "Patient Instructions". Document created using STT-dictation technology, any transcriptional errors that may result from process are unintentional.    Patient: Chase Mendez  Service Category: E/M  Provider: Oswaldo Done, MD  DOB: 1934/02/09  DOS: 03/05/2023  Referring Provider: Sherron Monday, MD  MRN: [id number]  Specialty: Interventional Pain Management  PCP: Sherron Monday, MD  Type: Established Patient  Setting: Ambulatory outpatient    Location: Office  Delivery: Face-to-face     HPI  [name], a 89 y.o. year old male, is here today because of his No primary diagnosis found.. Mr. Tiger primary complain today is No chief complaint on file.  Pertinent problems: Mr. Granja has Spinal accessory neuropathy; Lower limb pain, inferior (L5) (Foot Drop) (Left); Chronic low back pain (2ry area of Pain) (Bilateral) (ML) (L>R) w/ sciatica (Left); Chronic lower extremity pain (1ry area of Pain) (Left); Failed back surgical syndrome; Foot drop (Left); Lumbosacral radiculopathy at L5 (Left); DDD (degenerative disc disease), lumbar; Chronic pain syndrome; Thoracic central spinal stenosis; Chronic musculoskeletal pain; Neurogenic pain; Presence of neurostimulator; Gout of left foot due to renal impairment; Disturbance of skin sensation; Cervicalgia; Cervical facet syndrome; Bilateral lower abdominal pain; Abnormal MRI, thoracic spine (02/12/2018); DDD (degenerative disc disease), cervical; Cervicogenic headache; Painful cervical range of motion; Impaired range of motion of cervical spine; and Spondylosis without myelopathy or radiculopathy, cervical region on their pertinent problem list. Pain  Assessment: Severity of   is reported as a  /10. Location:    / . Onset:  . Quality:  . Timing:  . Modifying factor(s):  [name] Vitals:  vitals were not taken for this visit.  BMI: Estimated body mass index is 26.47 kg/m as calculated from the following:   Height as of 01/24/23: 6' (1.829 m).   Weight as of 01/24/23: 195 lb 3.2 oz (88.5 kg). Last encounter: 12/04/2022. Last procedure: 03/16/2022.  Reason for encounter:  *** . ***  Discussed the use of AI scribe software for clinical note transcription with the patient, who gave verbal consent to proceed.  History of Present Illness           Pharmacotherapy Assessment  Analgesic:  Oxycodone IR 10 mg, 1 tab PO q 6 hrs. (40 mg/day of oxycodone) MME/day: 60 mg/day.   Monitoring: [place] PMP: PDMP reviewed during this encounter.       Pharmacotherapy: No side-effects or adverse reactions reported. Compliance: No problems identified. Effectiveness: Clinically acceptable.  No notes on file  No results found for: "CBDTHCR" No results found for: "D8THCCBX" No results found for: "D9THCCBX"  UDS:  Summary  Date Value Ref Range Status  09/05/2022 Note  Final    Comment:    ==================================================================== [employer] [facility] [employer] (MW) ==================================================================== Test                             Result       Flag       Units  Drug Present and Declared for Prescription Verification   Oxycodone                      3918         EXPECTED   ng/mg creat  Oxymorphone                    979          EXPECTED   ng/mg creat   Noroxycodone                   7697         EXPECTED   ng/mg creat   Noroxymorphone                 868          EXPECTED   ng/mg creat    Sources of oxycodone are scheduled prescription medications.    Oxymorphone, noroxycodone, and noroxymorphone are expected    metabolites of oxycodone. Oxymorphone is also available as a    scheduled prescription  medication.  ==================================================================== Test                      Result    Flag   Units      Ref Range   Creatinine              34               mg/dL      >=56 ==================================================================== Declared Medications:  The flagging and interpretation on this report are based on the  following declared medications.  Unexpected results may arise from  inaccuracies in the declared medications.   **Note: The testing scope of this panel includes these medications:   Oxycodone   **Note: The testing scope of this panel does not include the  following reported medications:   Acetaminophen (Tylenol)  Allopurinol  Amlodipine  Aspirin  Atorvastatin  Diphenhydramine (Benadryl)  Famotidine  Fluticasone (Flonase)  Gabapentin (Neurontin)  Isosorbide (Imdur)  Levothyroxine  Losartan  Mirabegron (Myrbetriq)  Naloxone (Narcan)  Ondansetron  Sertraline (Zoloft)  Tamsulosin (Flomax)  Testosterone  Vitamin B12  Vitamin D2 (Drisdol)  Vitamin E ==================================================================== For clinical consultation, please call [phone]. ====================================================================       ROS  Constitutional: Denies any fever or chills Gastrointestinal: No reported hemesis, hematochezia, vomiting, or acute GI distress Musculoskeletal: Denies any acute onset joint swelling, redness, loss of ROM, or weakness Neurological: No reported episodes of acute onset apraxia, aphasia, dysarthria, agnosia, amnesia, paralysis, loss of coordination, or loss of consciousness  Medication Review  Cholecalciferol, Oxycodone HCl, Testosterone, Vitamin D (Ergocalciferol), acetaminophen, allopurinol, amLODipine, aspirin, atorvastatin, cyanocobalamin, diphenhydrAMINE, famotidine, fluticasone, gabapentin, hydrOXYzine, isosorbide mononitrate, levothyroxine, losartan, mirabegron ER,  naloxone, ondansetron, pantoprazole, sertraline, tamsulosin, and vitamin E  History Review  Allergy: Mr. Kohles is allergic to wellbutrin [bupropion], doxycycline, ibuprofen, and sulfa antibiotics. Drug: Mr. Kluth  reports current drug use. Drug: Oxycodone. Alcohol:  reports no history of alcohol use. Tobacco:  reports that he has been smoking cigars. He has never used smokeless tobacco. Social: Mr. Sabic  reports that he has been smoking cigars. He has never used smokeless tobacco. He reports current drug use. Drug: Oxycodone. He reports that he does not drink alcohol. Medical:  has a past medical history of Acute encephalopathy (12/08/2014), Anxiety, ARF (acute renal failure) (HCC) (12/08/2014), Back pain, Benign neoplasm of large bowel, Capsulitis, Chronic back pain, Coronavirus infection, Depression, Diabetes mellitus without complication (HCC), Dysphagia, Exostosis, Foot drop, left, Frequent falls (02/2018), GERD (gastroesophageal reflux disease), Gout, Hypertension, Hypothyroidism, Insomnia, Low testosterone, Microscopic hematuria (2016), Myocardial infarction (HCC), Pneumonia (12/07/2014), Pressure ulcer (12/09/2014), Sepsis (HCC) (12/08/2014), and Thyroid disease. Surgical: Mr. Rust  has  a past surgical history that includes Cholecystectomy; Esophagogastroduodenoscopy (egd) with propofol (N/A, 04/24/2017); Back surgery (1995, 1996); Eye surgery (Bilateral, 1983, 1985); Appendectomy; and Spinal cord stimulator insertion (N/A, 03/13/2018). Family: family history includes Diabetes in his father; Heart disease in his mother; Kidney cancer in his brother.  Laboratory Chemistry Profile   Renal Lab Results  Component Value Date   BUN 14 03/02/2023   CREATININE 1.15 03/02/2023   BCR 12 03/02/2023   GFRAA >60 01/28/2019   GFRNONAA >60 07/19/2022    Hepatic Lab Results  Component Value Date   AST 16 03/02/2023   ALT 10 03/02/2023   ALBUMIN 3.7 03/02/2023   ALKPHOS 110 03/02/2023   HCVAB <0.1  07/24/2018   LIPASE 37 08/21/2016    Electrolytes Lab Results  Component Value Date   NA 144 03/02/2023   K 5.1 03/02/2023   CL 104 03/02/2023   CALCIUM 8.6 03/02/2023   MG 2.2 07/18/2022   PHOS 2.6 07/19/2022    Bone Lab Results  Component Value Date   TESTOSTERONE 90 (L) 01/11/2023    Inflammation (CRP: Acute Phase) (ESR: Chronic Phase) Lab Results  Component Value Date   CRP 6.6 (H) 07/19/2022   LATICACIDVEN 1.7 07/17/2022         Note: Above Lab results reviewed.  Recent Imaging Review  US Venous Img Lower Bilateral (DVT) CLINICAL DATA:  16109 Swelling 60454  EXAM: BILATERAL LOWER EXTREMITY VENOUS DOPPLER ULTRASOUND  TECHNIQUE: Gray-scale sonography with graded compression, as well as color Doppler and duplex ultrasound were performed to evaluate the lower extremity deep venous systems from the level of the common femoral vein and including the common femoral, femoral, profunda femoral, popliteal and calf veins including the posterior tibial, peroneal and gastrocnemius veins when visible. The superficial great saphenous vein was also interrogated. Spectral Doppler was utilized to evaluate flow at rest and with distal augmentation maneuvers in the common femoral, femoral and popliteal veins.  COMPARISON:  None Available.  FINDINGS: RIGHT LOWER EXTREMITY  Common Femoral Vein: No evidence of thrombus. Normal compressibility, respiratory phasicity and response to augmentation.  Saphenofemoral Junction: No evidence of thrombus. Normal compressibility and flow on color Doppler imaging.  Profunda Femoral Vein: No evidence of thrombus. Normal compressibility and flow on color Doppler imaging.  Femoral Vein: No evidence of thrombus. Normal compressibility, respiratory phasicity and response to augmentation.  Popliteal Vein: No evidence of thrombus. Normal compressibility, respiratory phasicity and response to augmentation.  Calf Veins: Calf veins were  poorly visualized on grayscale imaging, but were without evidence of thrombus on color Doppler evaluation  Superficial Great Saphenous Vein: No evidence of thrombus. Normal compressibility.  Venous Reflux:  None.  Other Findings:  None.  LEFT LOWER EXTREMITY  Common Femoral Vein: No evidence of thrombus. Normal compressibility, respiratory phasicity and response to augmentation.  Saphenofemoral Junction: No evidence of thrombus. Normal compressibility and flow on color Doppler imaging.  Profunda Femoral Vein: No evidence of thrombus. Normal compressibility and flow on color Doppler imaging.  Femoral Vein: No evidence of thrombus. Normal compressibility, respiratory phasicity and response to augmentation.  Popliteal Vein: No evidence of thrombus. Normal compressibility, respiratory phasicity and response to augmentation.  Calf Veins: Calf veins were poorly visualized on grayscale imaging, but were without evidence of thrombus on color Doppler evaluation  Superficial Great Saphenous Vein: No evidence of thrombus. Normal compressibility.  Venous Reflux:  None.  Other Findings: Soft tissue swelling in the left-greater-than-right lower extremity.  IMPRESSION: No evidence of deep venous thrombosis  in either lower extremity.  Electronically Signed   By: Lorenza Cambridge M.D.   On: 07/17/2022 15:43 CT Angio Chest PE W/Cm &/Or Wo Cm CLINICAL DATA:  Pulmonary embolism suspected, high probability  EXAM: CT ANGIOGRAPHY CHEST WITH CONTRAST  TECHNIQUE: Multidetector CT imaging of the chest was performed using the standard protocol during bolus administration of intravenous contrast. Multiplanar CT image reconstructions and MIPs were obtained to evaluate the vascular anatomy.  RADIATION DOSE REDUCTION: This exam was performed according to the departmental dose-optimization program which includes automated exposure control, adjustment of the mA and/or kV according to patient  size and/or use of iterative reconstruction technique.  CONTRAST:  50mL OMNIPAQUE IOHEXOL 350 MG/ML SOLN  COMPARISON:  None Available.  FINDINGS: Cardiovascular: Conventional 3 vessel arch anatomy. Atherosclerotic calcifications throughout the aorta and along the coronary arteries. Mild cardiomegaly. The pulmonary arteries are well opacified to the proximal segmental level. No evidence of acute pulmonary embolus. No pericardial effusion.  Mediastinum/Nodes: Unremarkable CT appearance of the thyroid gland. No suspicious mediastinal or hilar adenopathy. No soft tissue mediastinal mass. Small hiatal hernia.  Lungs/Pleura: Multifocal ground-glass attenuation airspace opacities and small nodules throughout the right upper lobe in a peribronchovascular distribution. Diffuse right lower lobe bronchial wall thickening with a combination of peribronchovascular patchy airspace opacities and volume loss. Similar but less impressive findings in the left lower lobe. Mild background of centrilobular pulmonary emphysema. No pleural effusion or pneumothorax.  Upper Abdomen: Submucosal edema at the hepatic flexure and proximal ascending colon. Inflammatory stranding is present in the pericolonic fat. The liver demonstrates a cirrhotic morphology with faint nodularity and hypertrophy of the left lobe relative to the right. The gallbladder is surgically absent.  Musculoskeletal: No chest wall abnormality. No acute or significant osseous findings. Epidural spinal stimulator in place.  Review of the MIP images confirms the above findings.  IMPRESSION: 1. CT findings are most consistent with multi lobar pneumonia involving the right upper, right lower and, to a lesser extent, the left lower lobes. 2. Negative for acute pulmonary embolus. 3. Additionally, the visualized hepatic flexure and proximal transverse colon are abnormal with diffuse wall thickening and pericolonic inflammatory stranding.  Findings suggest active infectious/inflammatory colitis. 4. Aortic and coronary artery atherosclerotic vascular calcifications. 5. Mild centrilobular pulmonary emphysema. 6. Small hiatal hernia.  Aortic Atherosclerosis (ICD10-I70.0) and Emphysema (ICD10-J43.9).  Electronically Signed   By: Malachy Moan M.D.   On: 07/17/2022 08:19 DG Chest Port 1 View CLINICAL DATA:  Questionable sepsis  EXAM: PORTABLE CHEST 1 VIEW  COMPARISON:  01/27/2019  FINDINGS: Low volume chest with interstitial crowding at the bases. No edema, effusion, or pneumothorax. Normal heart size and mediastinal contours for technique. Spinal cord stimulator the lower thoracic level.  IMPRESSION: Low volume chest with atelectatic type density at the bases. Early bronchopneumonia is not excluded.  Electronically Signed   By: Tiburcio Pea M.D.   On: 07/17/2022 05:50 Note: Reviewed        Physical Exam  General appearance: Well nourished, well developed, and well hydrated. In no apparent acute distress Mental status: Alert, oriented x 3 (person, place, & time)       Respiratory: No evidence of acute respiratory distress Eyes: PERLA Vitals: There were no vitals taken for this visit. BMI: Estimated body mass index is 26.47 kg/m as calculated from the following:   Height as of 01/24/23: 6' (1.829 m).   Weight as of 01/24/23: 195 lb 3.2 oz (88.5 kg). Ideal: Patient weight not recorded  Assessment  Diagnosis Status  No diagnosis found. Controlled Controlled Controlled   Updated Problems: No problems updated.  Plan of Care  Problem-specific:  Assessment and Plan            Mr. BENI TURRELL has a current medication list which includes the following long-term medication(s): allopurinol, amlodipine, amlodipine, atorvastatin, famotidine, gabapentin, isosorbide mononitrate, levothyroxine, losartan, mirabegron er, oxycodone hcl, pantoprazole, sertraline, testosterone, and [DISCONTINUED]  diphenhydramine.  Pharmacotherapy (Medications Ordered): No orders of the defined types were placed in this encounter.  Orders:  No orders of the defined types were placed in this encounter.  Follow-up plan:   No follow-ups on file.      Interventional Therapies  Risk  Complexity Considerations:   WNL   Planned  Pending:      Under consideration:   Therapeutic bilateral cervical facet MB RFA #1    Completed:   Diagnostic/therapeutic bilateral cervical facet MBB x2 (03/16/2022) (1st:100/100/100/100) (2nd: 100/100/100/20) Diagnostic/therapeutic left caudal ESI + epidurogram x1 (11/17/2021) (100/100/25/25)    Therapeutic  Palliative (PRN) options:   Therapeutic/palliative left L4 TFESI #2  Therapeutic/palliative left L5 TFESI #2  Bilateral spinal cord stimulator trial (done - 01/17/2018)  Permanent bilateral spinal cord stimulator implant by Dr. Marcell Barlow (neurosurgery) (done - 03/13/2018)   Pharmacotherapy  Nonopioids transferred 01/12/2020: Gabapentin        Recent Visits Date Type Provider Dept  12/04/22 Office Visit Delano Metz, MD [facility]  Showing recent visits within past 90 days and meeting all other requirements Future Appointments Date Type Provider Dept  03/05/23 Appointment Delano Metz, MD [facility]  Showing future appointments within next 90 days and meeting all other requirements  I discussed the assessment and treatment plan with the patient. The patient was provided an opportunity to ask questions and all were answered. The patient agreed with the plan and demonstrated an understanding of the instructions.  Patient advised to call back or seek an in-person evaluation if the symptoms or condition worsens.  Duration of encounter: *** minutes.  Total time on encounter, as per AMA guidelines included both the face-to-face and non-face-to-face time personally spent by the physician and/or other qualified health care  professional(s) on the day of the encounter (includes time in activities that require the physician or other qualified health care professional and does not include time in activities normally performed by clinical staff). Physician's time may include the following activities when performed: Preparing to see the patient (e.g., pre-charting review of records, searching for previously ordered imaging, lab work, and nerve conduction tests) Review of prior analgesic pharmacotherapies. Reviewing PMP Interpreting ordered tests (e.g., lab work, imaging, nerve conduction tests) Performing post-procedure evaluations, including interpretation of diagnostic procedures Obtaining and/or reviewing separately obtained history Performing a medically appropriate examination and/or evaluation Counseling and educating the patient/family/caregiver Ordering medications, tests, or procedures Referring and communicating with other health care professionals (when not separately reported) Documenting clinical information in the electronic or other health record Independently interpreting results (not separately reported) and communicating results to the patient/ family/caregiver Care coordination (not separately reported)  Note by: Oswaldo Done, MD Date: 03/05/2023; Time: 6:12 PM

## 2023-03-05 ENCOUNTER — Ambulatory Visit: Payer: PRIVATE HEALTH INSURANCE | Attending: Pain Medicine | Admitting: Pain Medicine

## 2023-03-05 DIAGNOSIS — M961 Postlaminectomy syndrome, not elsewhere classified: Secondary | ICD-10-CM

## 2023-03-05 DIAGNOSIS — M79605 Pain in left leg: Secondary | ICD-10-CM | POA: Diagnosis not present

## 2023-03-05 DIAGNOSIS — G894 Chronic pain syndrome: Secondary | ICD-10-CM | POA: Diagnosis not present

## 2023-03-05 DIAGNOSIS — G8929 Other chronic pain: Secondary | ICD-10-CM | POA: Diagnosis not present

## 2023-03-05 DIAGNOSIS — Z79891 Long term (current) use of opiate analgesic: Secondary | ICD-10-CM | POA: Diagnosis not present

## 2023-03-05 DIAGNOSIS — M5417 Radiculopathy, lumbosacral region: Secondary | ICD-10-CM | POA: Diagnosis not present

## 2023-03-05 DIAGNOSIS — Z79899 Other long term (current) drug therapy: Secondary | ICD-10-CM

## 2023-03-05 DIAGNOSIS — M5442 Lumbago with sciatica, left side: Secondary | ICD-10-CM

## 2023-03-05 MED ORDER — OXYCODONE HCL 10 MG PO TABS
10.0000 mg | ORAL_TABLET | Freq: Four times a day (QID) | ORAL | 0 refills | Status: DC | PRN
Start: 2023-04-05 — End: 2023-05-30

## 2023-03-05 MED ORDER — NALOXONE HCL 4 MG/0.1ML NA LIQD: 1.0000 | NASAL | 0 refills | Status: DC | PRN

## 2023-03-05 MED ORDER — OXYCODONE HCL 10 MG PO TABS: 10.0000 mg | ORAL_TABLET | Freq: Four times a day (QID) | ORAL | 0 refills | Status: DC | PRN

## 2023-03-05 NOTE — Progress Notes (Signed)
Nursing Pain Medication Assessment:  Safety precautions to be maintained throughout the outpatient stay will include: orient to surroundings, keep bed in low position, maintain call bell within reach at all times, provide assistance with transfer out of bed and ambulation.  Medication Inspection Compliance: Pill count conducted under aseptic conditions, in front of the patient. Neither the pills nor the bottle was removed from the patient's sight at any time. Once count was completed pills were immediately returned to the patient in their original bottle.  Medication: Oxycodone IR Pill/Patch Count:  13 out of 120 Pill/Patch Appearance: Markings consistent with prescribed medication Bottle Appearance: Standard pharmacy container. Clearly labeled. Filled Date: 3 / 28 / 2024 Last Medication intake:  Today

## 2023-03-05 NOTE — Patient Instructions (Addendum)
Pain Management Discharge Instructions  General Discharge Instructions :  If you need to reach your doctor call: Monday-Friday 8:00 am - 4:00 pm at [phone] or toll free [phone].  After clinic hours [phone] to have operator reach doctor.  Bring all of your medication bottles to all your appointments in the pain clinic.  To cancel or reschedule your appointment with Pain Management please remember to call 24 hours in advance to avoid a fee.  Refer to the educational materials which you have been given on: General Risks, I had my Procedure. Discharge Instructions, Post Sedation.  Post Procedure Instructions:  The drugs you were given will stay in your system until tomorrow, so for the next 24 hours you should not drive, make any legal decisions or drink any alcoholic beverages.  You may eat anything you prefer, but it is better to start with liquids then soups and crackers, and gradually work up to solid foods.  Please notify your doctor immediately if you have any unusual bleeding, trouble breathing or pain that is not related to your normal pain.  Depending on the type of procedure that was done, some parts of your body may feel week and/or numb.  This usually clears up by tonight or the next day.  Walk with the use of an assistive device or accompanied by an adult for the 24 hours.  You may use ice on the affected area for the first 24 hours.  Put ice in a Ziploc bag and cover with a towel and place against area 15 minutes on 15 minutes off.  You may switch to heat after 24 hours. ______________________________________________________________________    Opioid Pain Medication Update  To: All patients taking opioid pain medications. (I.e.: hydrocodone, hydromorphone, oxycodone, oxymorphone, morphine, codeine, methadone, tapentadol, tramadol, buprenorphine, fentanyl, etc.)  Re: Updated review of side effects and adverse reactions of opioid analgesics, as well as new  information about long term effects of this class of medications.  Direct risks of long-term opioid therapy are not limited to opioid addiction and overdose. Potential medical risks include serious fractures, breathing problems during sleep, hyperalgesia, immunosuppression, chronic constipation, bowel obstruction, myocardial infarction, and tooth decay secondary to xerostomia.  Unpredictable adverse effects that can occur even if you take your medication correctly: Cognitive impairment, respiratory depression, and death. Most people think that if they take their medication "correctly", and "as instructed", that they will be safe. Nothing could be farther from the truth. In reality, a significant amount of recorded deaths associated with the use of opioids has occurred in individuals that had taken the medication for a long time, and were taking their medication correctly. The following are examples of how this can happen: Patient taking his/her medication for a long time, as instructed, without any side effects, is given a certain antibiotic or another unrelated medication, which in turn triggers a "Drug-to-drug interaction" leading to disorientation, cognitive impairment, impaired reflexes, respiratory depression or an untoward event leading to serious bodily harm or injury, including death.  Patient taking his/her medication for a long time, as instructed, without any side effects, develops an acute impairment of liver and/or kidney function. This will lead to a rapid inability of the body to breakdown and eliminate their pain medication, which will result in effects similar to an "overdose", but with the same medicine and dose that they had always taken. This again may lead to disorientation, cognitive impairment, impaired reflexes, respiratory depression or an untoward event leading to serious bodily harm or injury, including  death.  A similar problem will occur with patients as they grow older and their  liver and kidney function begins to decrease as part of the aging process.  Background information: Historically, the original case for using long-term opioid therapy to treat chronic noncancer pain was based on safety assumptions that subsequent experience has called into question. In 1996, the American Pain Society and the American Academy of Pain Medicine issued a consensus statement supporting long-term opioid therapy. This statement acknowledged the dangers of opioid prescribing but concluded that the risk for addiction was low; respiratory depression induced by opioids was short-lived, occurred mainly in opioid-naive patients, and was antagonized by pain; tolerance was not a common problem; and efforts to control diversion should not constrain opioid prescribing. This has now proven to be wrong. Experience regarding the risks for opioid addiction, misuse, and overdose in community practice has failed to support these assumptions.  According to the Centers for Disease Control and Prevention, fatal overdoses involving opioid analgesics have increased sharply over the past decade. Currently, more than 96,700 people die from drug overdoses every year. Opioids are a factor in 7 out of every 10 overdose deaths. Deaths from drug overdose have surpassed motor vehicle accidents as the leading cause of death for individuals between the ages of 80 and 51.  Clinical data suggest that neuroendocrine dysfunction may be very common in both men and women, potentially causing hypogonadism, erectile dysfunction, infertility, decreased libido, osteoporosis, and depression. Recent studies linked higher opioid dose to increased opioid-related mortality. Controlled observational studies reported that long-term opioid therapy may be associated with increased risk for cardiovascular events. Subsequent meta-analysis concluded that the safety of long-term opioid therapy in elderly patients has not been proven.   Side Effects  and adverse reactions: Common side effects: Drowsiness (sedation). Dizziness. Nausea and vomiting. Constipation. Physical dependence -- Dependence often manifests with withdrawal symptoms when opioids are discontinued or decreased. Tolerance -- As you take repeated doses of opioids, you require increased medication to experience the same effect of pain relief. Respiratory depression -- This can occur in healthy people, especially with higher doses. However, people with COPD, asthma or other lung conditions may be even more susceptible to fatal respiratory impairment.  Uncommon side effects: An increased sensitivity to feeling pain and extreme response to pain (hyperalgesia). Chronic use of opioids can lead to this. Delayed gastric emptying (the process by which the contents of your stomach are moved into your small intestine). Muscle rigidity. Immune system and hormonal dysfunction. Quick, involuntary muscle jerks (myoclonus). Arrhythmia. Itchy skin (pruritus). Dry mouth (xerostomia).  Long-term side effects: Chronic constipation. Sleep-disordered breathing (SDB). Increased risk of bone fractures. Hypothalamic-pituitary-adrenal dysregulation. Increased risk of overdose.  RISKS: Respiratory depression and death: Opioids increase the risk of respiratory depression and death.  Drug-to-drug interactions: Opioids are relatively contraindicated in combination with benzodiazepines, sleep inducers, and other central nervous system depressants. Other classes of medications (i.e.: certain antibiotics and even over-the-counter medications) may also trigger or induce respiratory depression in some patients.  Medical conditions: Patients with pre-existing respiratory problems are at higher risk of respiratory failure and/or depression when in combination with opioid analgesics. Opioids are relatively contraindicated in some medical conditions such as central sleep apnea.   Fractures and Falls:   Opioids increase the risk and incidence of falls. This is of particular importance in elderly patients.  Endocrine System:  Long-term administration is associated with endocrine abnormalities (endocrinopathies). (Also known as Opioid-induced Endocrinopathy) Influences on both the hypothalamic-pituitary-adrenal axis?and the hypothalamic-pituitary-gonadal  axis have been demonstrated with consequent hypogonadism and adrenal insufficiency in both sexes. Hypogonadism and decreased levels of dehydroepiandrosterone sulfate have been reported in men and women. Endocrine effects include: Amenorrhoea in women (abnormal absence of menstruation) Reduced libido in both sexes Decreased sexual function Erectile dysfunction in men Hypogonadisms (decreased testicular function with shrinkage of testicles) Infertility Depression and fatigue Loss of muscle mass Anxiety Depression Immune suppression Hyperalgesia Weight gain Anemia Osteoporosis Patients (particularly women of childbearing age) should avoid opioids. There is insufficient evidence to recommend routine monitoring of asymptomatic patients taking opioids in the long-term for hormonal deficiencies.  Immune System: Human studies have demonstrated that opioids have an immunomodulating effect. These effects are mediated via opioid receptors both on immune effector cells and in the central nervous system. Opioids have been demonstrated to have adverse effects on antimicrobial response and anti-tumour surveillance. Buprenorphine has been demonstrated to have no impact on immune function.  Opioid Induced Hyperalgesia: Human studies have demonstrated that prolonged use of opioids can lead to a state of abnormal pain sensitivity, sometimes called opioid induced hyperalgesia (OIH). Opioid induced hyperalgesia is not usually seen in the absence of tolerance to opioid analgesia. Clinically, hyperalgesia may be diagnosed if the patient on long-term  opioid therapy presents with increased pain. This might be qualitatively and anatomically distinct from pain related to disease progression or to breakthrough pain resulting from development of opioid tolerance. Pain associated with hyperalgesia tends to be more diffuse than the pre-existing pain and less defined in quality. Management of opioid induced hyperalgesia requires opioid dose reduction.  Cancer: Chronic opioid therapy has been associated with an increased risk of cancer among noncancer patients with chronic pain. This association was more evident in chronic strong opioid users. Chronic opioid consumption causes significant pathological changes in the small intestine and colon. Epidemiological studies have found that there is a link between opium dependence and initiation of gastrointestinal cancers. Cancer is the second leading cause of death after cardiovascular disease. Chronic use of opioids can cause multiple conditions such as GERD, immunosuppression and renal damage as well as carcinogenic effects, which are associated with the incidence of cancers.   Mortality: Long-term opioid use has been associated with increased mortality among patients with chronic non-cancer pain (CNCP).  Prescription of long-acting opioids for chronic noncancer pain was associated with a significantly increased risk of all-cause mortality, including deaths from causes other than overdose.  Reference: Von Korff M, Kolodny A, Deyo RA, Chou R. Long-term opioid therapy reconsidered. Ann Intern Med. 2011 Sep 6;155(5):325-8. doi: 10.7326/[id number]. PMID: 96295284; PMCID: [id number]. Randon Goldsmith, Hayward RA, Dunn KM, [place] KP. Risk of adverse events in patients prescribed long-term opioids: A cohort study in the [place] Clinical Practice Research Datalink. Eur J Pain. 2019 May;23(5):908-922. doi: 10.1002/ejp.1357. Epub 2019 Jan 31. PMID: 25366440. Colameco S, Coren JS, Ciervo CA.  Continuous opioid treatment for chronic noncancer pain: a time for moderation in prescribing. Postgrad Med. 2009 Jul;121(4):61-6. doi: 10.3810/pgm.2009.07.2032. PMID: 34742595. William Hamburger RN, [place], Blazina I, Cristopher Peru, Bougatsos C, Deyo RA. The effectiveness and risks of long-term opioid therapy for chronic pain: a systematic review for a [employer] of [employer] Pathways to [employer]. Ann Intern Med. 2015 Feb 17;162(4):276-86. doi: 10.7326/M14-2559. PMID: 63875643. Caryl Bis [facility], [place] DM. NCHS Data Brief No. 22. [place]: Centers for Disease Control and Prevention; 2009. Sep, Increase in Fatal Poisonings Involving Opioid Analgesics in the [place], 1999-2006. Geraldine Contras, Choi HR, Oh  TK. Long-term opioid use and mortality in patients with chronic non-cancer pain: Ten-year follow-up study in [place] from 2010 through 2019. EClinicalMedicine. 2022 Jul 18;51:101558. doi: 10.1016/j.eclinm.2022.161096. PMID: 04540981; PMCID: [id number]. Huser, W., Schubert, T., Vogelmann, T. et al. All-cause mortality in patients with long-term opioid therapy compared with non-opioid analgesics for chronic non-cancer pain: a database study. BMC Med 18, 162 (2020). http://lester.info/ Rashidian H, Karie Kirks, Malekzadeh R, Haghdoost AA. An Ecological Study of the Association between Opiate Use and Incidence of Cancers. Addict Health. 2016 Fall;8(4):252-260. PMID: 21308657; PMCID: [id number].  Our Goal: Our goal is to control your pain with means other than the use of opioid pain medications.  Our Recommendation: Talk to your physician about coming off of these medications. We can assist you with the tapering down and stopping these medicines. Based on the new information, even if you cannot completely stop the medication, a decrease in the dose may be associated with a lesser risk. Ask for other means of controlling the pain. Decrease  or eliminate those factors that significantly contribute to your pain such as smoking, obesity, and a diet heavily tilted towards "inflammatory" nutrients.  Last Updated: 08/14/2022   ______________________________________________________________________       ______________________________________________________________________    National Pain Medication Shortage  The [place] is experiencing worsening drug shortages. These have had a negative widespread effect on patient care and treatment. Not expected to improve any time soon. Predicted to last past 2029.   Drug shortage list (generic names) Oxycodone IR Oxycodone/APAP Oxymorphone IR Hydromorphone Hydrocodone/APAP Morphine  Where is the problem?  Manufacturing and supply level.  Will this shortage affect you?  Only if you take any of the above pain medications.  How? You may be unable to fill your prescription.  Your pharmacist may offer a "partial fill" of your prescription. (Warning: Do not accept partial fills.) Prescriptions partially filled cannot be transferred to another pharmacy. Read our Medication Rules and Regulation. Depending on how much medicine you are dependent on, you may experience withdrawals when unable to get the medication.  Recommendations: Consider ending your dependence on opioid pain medications. Ask your pain specialist to assist you with the process. Consider switching to a medication currently not in shortage, such as Buprenorphine. Talk to your pain specialist about this option. Consider decreasing your pain medication requirements by managing tolerance thru "Drug Holidays". This may help minimize withdrawals, should you run out of medicine. Control your pain thru the use of non-pharmacological interventional therapies.   Your prescriber: Prescribers cannot be blamed for shortages. Medication manufacturing and supply issues cannot be fixed by the prescriber.   NOTE: The prescriber is not  responsible for supplying the medication, or solving supply issues. Work with your pharmacist to solve it. The patient is responsible for the decision to take or continue taking the medication and for identifying and securing a legal supply source. By law, supplying the medication is the job and responsibility of the pharmacy. The prescriber is responsible for the evaluation, monitoring, and prescribing of these medications.   Prescribers will NOT: Re-issue prescriptions that have been partially filled. Re-issue prescriptions already sent to a pharmacy.  Re-send prescriptions to a different pharmacy because yours did not have your medication. Ask pharmacist to order more medicine or transfer the prescription to another pharmacy. (Read below.)  New 2023 regulation: "October 07, 2021 Revised Regulation Allows DEA-Registered Pharmacies to Transfer Electronic Prescriptions at a Patient's Request DEA Headquarters Division - Public Information Office Patients now have the  ability to request their electronic prescription be transferred to another pharmacy without having to go back to their practitioner to initiate the request. This revised regulation went into effect on Monday, October 03, 2021.     At a patient's request, a DEA-registered retail pharmacy can now transfer an electronic prescription for a controlled substance (schedules II-V) to another DEA-registered retail pharmacy. Prior to this change, patients would have to go through their practitioner to cancel their prescription and have it re-issued to a different pharmacy. The process was taxing and time consuming for both patients and practitioners.    The Drug Enforcement Administration [facility]) published its intent to revise the process for transferring electronic prescriptions on December 26, 2019.  The final rule was published in the federal register on September 01, 2021 and went into effect 30 days later.  Under the final rule, a prescription can  only be transferred once between pharmacies, and only if allowed under existing state or other applicable law. The prescription must remain in its electronic form; may not be altered in any way; and the transfer must be communicated directly between two licensed pharmacists. It's important to note, any authorized refills transfer with the original prescription, which means the entire prescription will be filled at the same pharmacy".  Reference: [email or web address] [facility] website announcement)  [email or web address] Financial planner of Justice)   [employer] / Vol. 88, No. 143 / Thursday, September 01, 2021 / Rules and Regulations DEPARTMENT OF JUSTICE  Drug Enforcement Administration  21 CFR Part 1306  [Docket No. [id number]]  RIN [id number] Transfer of Electronic Prescriptions for Schedules II-V Controlled Substances Between Pharmacies for Initial Filling  ______________________________________________________________________       ______________________________________________________________________    Transfer of Pain Medication between Pharmacies  Re: 2023 DEA Clarification on existing regulation  Published on DEA Website: October 07, 2021  Title: Revised Regulation Allows DEA-Registered Pharmacies to Electrical engineer Prescriptions at a Patient's Request DEA Headquarters Division - [employer]  "Patients now have the ability to request their electronic prescription be transferred to another pharmacy without having to go back to their practitioner to initiate the request. This revised regulation went into effect on Monday, October 03, 2021.     At a patient's request, a DEA-registered retail pharmacy can now transfer an electronic prescription for a controlled substance (schedules II-V) to another DEA-registered retail  pharmacy. Prior to this change, patients would have to go through their practitioner to cancel their prescription and have it re-issued to a different pharmacy. The process was taxing and time consuming for both patients and practitioners.    The Drug Enforcement Administration [facility]) published its intent to revise the process for transferring electronic prescriptions on December 26, 2019.  The final rule was published in the federal register on September 01, 2021 and went into effect 30 days later.  Under the final rule, a prescription can only be transferred once between pharmacies, and only if allowed under existing state or other applicable law. The prescription must remain in its electronic form; may not be altered in any way; and the transfer must be communicated directly between two licensed pharmacists. It's important to note, any authorized refills transfer with the original prescription, which means the entire prescription will be filled at the same pharmacy."    REFERENCES: 1. DEA website announcement [email or web address]  2. Department of Justice website  [email or web address].pdf  3. DEPARTMENT OF JUSTICE Drug Enforcement Administration 21 CFR Part 1306 [Docket No. [id number]] RIN [id number] "Transfer of  Electronic Prescriptions for Schedules II-V Controlled Substances Between Pharmacies for Initial Filling"  ______________________________________________________________________       ______________________________________________________________________    Medication Rules  Purpose: To inform patients, and their family members, of our medication rules and regulations.  Applies to: All patients receiving prescriptions from our practice (written or electronic).  Pharmacy of record: This is the pharmacy where your electronic prescriptions will be sent.  Make sure we have the correct one.  Electronic prescriptions: In compliance with the [facility] Strengthen Opioid Misuse Prevention (STOP) Act of 2017 (Session Conni Elliot [phone]), effective February 06, 2018, all controlled substances must be electronically prescribed. Written prescriptions, faxing, or calling prescriptions to a pharmacy will no longer be done.  Prescription refills: These will be provided only during in-person appointments. No medications will be renewed without a "face-to-face" evaluation with your provider. Applies to all prescriptions.  NOTE: The following applies primarily to controlled substances (Opioid* Pain Medications).   Type of encounter (visit): For patients receiving controlled substances, face-to-face visits are required. (Not an option and not up to the patient.)  Patient's Responsibilities: Pain Pills: Bring all pain pills to every appointment (except for procedure appointments). Pill counts are required.  Pill Bottles: Bring pills in original pharmacy bottle. Bring bottle, even if empty. Always bring the bottle of the most recent fill.  Medication refills: You are responsible for knowing and keeping track of what medications you are taking and when is it that you will need a refill. The day before your appointment: write a list of all prescriptions that need to be refilled. The day of the appointment: give the list to the admitting nurse. Prescriptions will be written only during appointments. No prescriptions will be written on procedure days. If you forget a medication: it will not be "Called in", "Faxed", or "electronically sent". You will need to get another appointment to get these prescribed. No early refills. Do not call asking to have your prescription filled early. Partial  or short prescriptions: Occasionally your pharmacy may not have enough pills to fill your prescription.  NEVER ACCEPT a partial fill or a prescription that is short of the total  amount of pills that you were prescribed.  With controlled substances the law allows 72 hours for the pharmacy to complete the prescription.  If the prescription is not completed within 72 hours, the pharmacist will require a new prescription to be written. This means that you will be short on your medicine and we WILL NOT send another prescription to complete your original prescription.  Instead, request the pharmacy to send a carrier to a nearby branch to get enough medication to provide you with your full prescription. Prescription Accuracy: You are responsible for carefully inspecting your prescriptions before leaving our office. Have the discharge nurse carefully go over each prescription with you, before taking them home. Make sure that your name is accurately spelled, that your address is correct. Check the name and dose of your medication to make sure it is accurate. Check the number of pills, and the written instructions to make sure they are clear and accurate. Make sure that you are given enough medication to last until your next medication refill appointment. Taking Medication: Take medication as prescribed. When it comes to controlled substances, taking less pills or less frequently than prescribed is permitted and encouraged. Never take more pills than instructed. Never take the medication more frequently than prescribed.  Inform other Doctors: Always inform, all of your healthcare providers, of all the  medications you take. Pain Medication from other Providers: You are not allowed to accept any additional pain medication from any other Doctor or Healthcare provider. There are two exceptions to this rule. (see below) In the event that you require additional pain medication, you are responsible for notifying us, as stated below. Cough Medicine: Often these contain an opioid, such as codeine or hydrocodone. Never accept or take cough medicine containing these opioids if you are already taking an  opioid* medication. The combination may cause respiratory failure and death. Medication Agreement: You are responsible for carefully reading and following our Medication Agreement. This must be signed before receiving any prescriptions from our practice. Safely store a copy of your signed Agreement. Violations to the Agreement will result in no further prescriptions. (Additional copies of our Medication Agreement are available upon request.) Laws, Rules, & Regulations: All patients are expected to follow all [address] and [employer], Rules, [place] [employer]. Ignorance of the Laws does not constitute a valid excuse.  Illegal drugs and Controlled Substances: The use of illegal substances (including, but not limited to marijuana and its derivatives) and/or the illegal use of any controlled substances is strictly prohibited. Violation of this rule may result in the immediate and permanent discontinuation of any and all prescriptions being written by our practice. The use of any illegal substances is prohibited. Adopted CDC guidelines & recommendations: Target dosing levels will be at or below 60 MME/day. Use of benzodiazepines** is not recommended. Urine Drug testing: Patients taking controlled substances will be required to provide a urine sample upon request. Do not void before coming to your medication management appointments. Hold emptying your bladder until you are admitted. The admitting nurse will inform you if a sample is required. Our practice reserves the right to call you at any time to provide a sample. Once receiving the call, you have 24 hours to comply with request. Not providing a sample upon request may result in termination of medication therapy.  Exceptions: There are only two exceptions to the rule of not receiving pain medications from other Healthcare Providers. Exception #1 (Emergencies): In the event of an emergency (i.e.: accident requiring emergency care), you are allowed to  receive additional pain medication. However, you are responsible for: As soon as you are able, call our office [phone], at any time of the day or night, and leave a message stating your name, the date and nature of the emergency, and the name and dose of the medication prescribed. In the event that your call is answered by a member of our staff, make sure to document and save the date, time, and the name of the person that took your information.  Exception #2 (Planned Surgery): In the event that you are scheduled by another doctor or dentist to have any type of surgery or procedure, you are allowed (for a period no longer than 30 days), to receive additional pain medication, for the acute post-op pain. However, in this case, you are responsible for picking up a copy of our "Post-op Pain Management for Surgeons" handout, and giving it to your surgeon or dentist. This document is available at our office, and does not require an appointment to obtain it. Simply go to our office during business hours (Monday-Thursday from 8:00 AM to 4:00 PM) (Friday 8:00 AM to 12:00 Noon) or if you have a scheduled appointment with [place], prior to your surgery, and ask for it by name. In addition, you are responsible for: calling our office (  [phone], at any time of the day or night, and leaving a message stating your name, name of your surgeon, type of surgery, and date of procedure or surgery. Failure to comply with your responsibilities may result in termination of therapy involving the controlled substances.  Consequences:  Non-compliance with the above rules may result in permanent discontinuation of medication prescription therapy. All patients receiving any type of controlled substance is expected to comply with the above patient responsibilities. Not doing so may result in permanent discontinuation of medication prescription therapy. Medication Agreement Violation. Following the above rules, including your  responsibilities will help you in avoiding a Medication Agreement Violation ("Breaking your Pain Medication Contract").  *Opioid medications include: morphine, codeine, oxycodone, oxymorphone, hydrocodone, hydromorphone, meperidine, tramadol, tapentadol, buprenorphine, fentanyl, methadone. **Benzodiazepine medications include: diazepam (Valium), alprazolam (Xanax), clonazepam (Klonopine), lorazepam (Ativan), clorazepate (Tranxene), chlordiazepoxide (Librium), estazolam (Prosom), oxazepam (Serax), temazepam (Restoril), triazolam (Halcion) (Last updated: 11/29/2022) ______________________________________________________________________      ______________________________________________________________________    Medication Recommendations and Reminders  Applies to: All patients receiving prescriptions (written and/or electronic).  Medication Rules & Regulations: You are responsible for reading, knowing, and following our "Medication Rules" document. These exist for your safety and that of others. They are not flexible and neither are we. Dismissing or ignoring them is an act of "non-compliance" that may result in complete and irreversible termination of such medication therapy. For safety reasons, "non-compliance" will not be tolerated. As with the [place] fundamental legal principle of "ignorance of the law is no defense", we will accept no excuses for not having read and knowing the content of documents provided to you by our practice.  Pharmacy of record:  Definition: This is the pharmacy where your electronic prescriptions will be sent.  We do not endorse any particular pharmacy. It is up to you and your insurance to decide what pharmacy to use.  We do not restrict you in your choice of pharmacy. However, once we write for your prescriptions, we will NOT be re-sending more prescriptions to fix restricted supply problems created by your pharmacy, or your insurance.  The pharmacy listed in the  electronic medical record should be the one where you want electronic prescriptions to be sent. If you choose to change pharmacy, simply notify our nursing staff. Changes will be made only during your regular appointments and not over the phone.  Recommendations: Keep all of your pain medications in a safe place, under lock and key, even if you live alone. We will NOT replace lost, stolen, or damaged medication. We do not accept "Police Reports" as proof of medications having been stolen. After you fill your prescription, take 1 week's worth of pills and put them away in a safe place. You should keep a separate, properly labeled bottle for this purpose. The remainder should be kept in the original bottle. Use this as your primary supply, until it runs out. Once it's gone, then you know that you have 1 week's worth of medicine, and it is time to come in for a prescription refill. If you do this correctly, it is unlikely that you will ever run out of medicine. To make sure that the above recommendation works, it is very important that you make sure your medication refill appointments are scheduled at least 1 week before you run out of medicine. To do this in an effective manner, make sure that you do not leave the office without scheduling your next medication management appointment. Always ask the nursing staff to show  you in your prescription , when your medication will be running out. Then arrange for the receptionist to get you a return appointment, at least 7 days before you run out of medicine. Do not wait until you have 1 or 2 pills left, to come in. This is very poor planning and does not take into consideration that we may need to cancel appointments due to bad weather, sickness, or emergencies affecting our staff. DO NOT ACCEPT A "Partial Fill": If for any reason your pharmacy does not have enough pills/tablets to completely fill or refill your prescription, do not allow for a "partial fill". The law  allows the pharmacy to complete that prescription within 72 hours, without requiring a new prescription. If they do not fill the rest of your prescription within those 72 hours, you will need a separate prescription to fill the remaining amount, which we will NOT provide. If the reason for the partial fill is your insurance, you will need to talk to the pharmacist about payment alternatives for the remaining tablets, but again, DO NOT ACCEPT A PARTIAL FILL, unless you can trust your pharmacist to obtain the remainder of the pills within 72 hours.  Prescription refills and/or changes in medication(s):  Prescription refills, and/or changes in dose or medication, will be conducted only during scheduled medication management appointments. (Applies to both, written and electronic prescriptions.) No refills on procedure days. No medication will be changed or started on procedure days. No changes, adjustments, and/or refills will be conducted on a procedure day. Doing so will interfere with the diagnostic portion of the procedure. No phone refills. No medications will be "called into the pharmacy". No Fax refills. No weekend refills. No Holliday refills. No after hours refills.  Remember:  Business hours are:  Monday to Thursday 8:00 AM to 4:00 PM Provider's Schedule: Delano Metz, MD - Appointments are:  Medication management: Monday and Wednesday 8:00 AM to 4:00 PM Procedure day: Tuesday and Thursday 7:30 AM to 4:00 PM Edward Jolly, MD - Appointments are:  Medication management: Tuesday and Thursday 8:00 AM to 4:00 PM Procedure day: Monday and Wednesday 7:30 AM to 4:00 PM (Last update: 11/29/2021) ______________________________________________________________________      ______________________________________________________________________     Naloxone Nasal Spray  Why am I receiving this medication? [place] STOP ACT requires that all patients taking high dose opioids or at  risk of opioids respiratory depression, be prescribed an opioid reversal agent, such as Naloxone (AKA: Narcan).  What is this medication? NALOXONE (nal OX one) treats opioid overdose, which causes slow or shallow breathing, severe drowsiness, or trouble staying awake. Call emergency services after using this medication. You may need additional treatment. Naloxone works by reversing the effects of opioids. It belongs to a group of medications called opioid blockers.  COMMON BRAND NAME(S): Kloxxado, Narcan  What should I tell my care team before I take this medication? They need to know if you have any of these conditions: Heart disease Substance use disorder An unusual or allergic reaction to naloxone, other medications, foods, dyes, or preservatives Pregnant or trying to get pregnant Breast-feeding  When to use this medication? This medication is to be used for the treatment of respiratory depression (less than 8 breaths per minute) secondary to opioid overdose.   How to use this medication? This medication is for use in the nose. Lay the person on their back. Support their neck with your hand and allow the head to tilt back before giving the medication. The nasal  spray should be given into 1 nostril. After giving the medication, move the person onto their side. Do not remove or test the nasal spray until ready to use. Get emergency medical help right away after giving the first dose of this medication, even if the person wakes up. You should be familiar with how to recognize the signs and symptoms of a narcotic overdose. If more doses are needed, give the additional dose in the other nostril. Talk to your care team about the use of this medication in children. While this medication may be prescribed for children as young as newborns for selected conditions, precautions do apply.  Naloxone Overdosage: If you think you have taken too much of this medicine contact a poison control center or  emergency room at once.  NOTE: This medicine is only for you. Do not share this medicine with others.  What if I miss a dose? This does not apply.  What may interact with this medication? This is only used during an emergency. No interactions are expected during emergency use. This list may not describe all possible interactions. Give your health care provider a list of all the medicines, herbs, non-prescription drugs, or dietary supplements you use. Also tell them if you smoke, drink alcohol, or use illegal drugs. Some items may interact with your medicine.  What should I watch for while using this medication? Keep this medication ready for use in the case of an opioid overdose. Make sure that you have the phone number of your care team and local hospital ready. You may need to have additional doses of this medication. Each nasal spray contains a single dose. Some emergencies may require additional doses. After use, bring the treated person to the nearest hospital or call 911. Make sure the treating care team knows that the person has received a dose of this medication. You will receive additional instructions on what to do during and after use of this medication before an emergency occurs.  What side effects may I notice from receiving this medication? Side effects that you should report to your care team as soon as possible: Allergic reactions--skin rash, itching, hives, swelling of the face, lips, tongue, or throat Side effects that usually do not require medical attention (report these to your care team if they continue or are bothersome): Constipation Dryness or irritation inside the nose Headache Increase in blood pressure Muscle spasms Stuffy nose Toothache This list may not describe all possible side effects. Call your doctor for medical advice about side effects. You may report side effects to FDA at 1-800-FDA-1088.  Where should I keep my medication? Because this is an  emergency medication, you should keep it with you at all times.  Keep out of the reach of children and pets. Store between 20 and 25 degrees C (68 and 77 degrees F). Do not freeze. Throw away any unused medication after the expiration date. Keep in original box until ready to use.  NOTE: This sheet is a summary. It may not cover all possible information. If you have questions about this medicine, talk to your doctor, pharmacist, or health care provider.   2023 [employer] Standard (2020-10-01 00:00:00)  ______________________________________________________________________

## 2023-03-07 ENCOUNTER — Encounter: Payer: Self-pay | Admitting: Internal Medicine

## 2023-03-07 ENCOUNTER — Encounter: Payer: Self-pay | Admitting: Urology

## 2023-03-07 ENCOUNTER — Ambulatory Visit (INDEPENDENT_AMBULATORY_CARE_PROVIDER_SITE_OTHER): Payer: Medicare HMO | Admitting: Internal Medicine

## 2023-03-07 VITALS — BP 118/60 | HR 65 | Temp 98.6°F | Ht 72.0 in | Wt 195.0 lb

## 2023-03-07 DIAGNOSIS — E039 Hypothyroidism, unspecified: Secondary | ICD-10-CM

## 2023-03-07 DIAGNOSIS — K2100 Gastro-esophageal reflux disease with esophagitis, without bleeding: Secondary | ICD-10-CM | POA: Diagnosis not present

## 2023-03-07 DIAGNOSIS — E782 Mixed hyperlipidemia: Secondary | ICD-10-CM | POA: Diagnosis not present

## 2023-03-07 DIAGNOSIS — I10 Essential (primary) hypertension: Secondary | ICD-10-CM

## 2023-03-07 MED ORDER — LEVOTHYROXINE SODIUM 150 MCG PO TABS: 150.0000 ug | ORAL_TABLET | Freq: Every day | ORAL | 1 refills | Status: DC

## 2023-03-07 MED ORDER — ONDANSETRON 4 MG PO TBDP: 4.0000 mg | ORAL_TABLET | Freq: Four times a day (QID) | ORAL | 3 refills | Status: DC

## 2023-03-07 NOTE — Progress Notes (Signed)
Established Patient Office Visit  Subjective:  Patient ID: Chase Mendez, male    DOB: [birth year]  Age: 89 y.o. MRN: [id number]  Chief Complaint  Patient presents with   Follow-up    3 month follow up, discuss lab results.    No new complaints, here for lab review and medication refills. TSH satisfactory with improved lipids on lab review.    No other concerns at this time.   Past Medical History:  Diagnosis Date   Acute encephalopathy 12/08/2014   Anxiety    ARF (acute renal failure) (HCC) 12/08/2014   Back pain    Benign neoplasm of large bowel    Capsulitis    fractured displaced metatarsal with capsulitis   Chronic back pain    Coronavirus infection    Depression    Diabetes mellitus without complication (HCC)    no medications currently   Dysphagia    Exostosis    painful, right hallux   Foot drop, left    wears a brace   Frequent falls 02/2018   poor balance, foot drop   GERD (gastroesophageal reflux disease)    Gout    Hypertension    Hypothyroidism    Insomnia    Low testosterone    Microscopic hematuria 2016   Myocardial infarction [facility])    patient unaware when it happened years ago.     Pneumonia 12/07/2014   Pressure ulcer 12/09/2014   Sepsis (HCC) 12/08/2014   Thyroid disease     Past Surgical History:  Procedure Laterality Date   APPENDECTOMY     BACK SURGERY  1995, 1996   x 2   CHOLECYSTECTOMY     ESOPHAGOGASTRODUODENOSCOPY (EGD) WITH PROPOFOL N/A 04/24/2017   Procedure: ESOPHAGOGASTRODUODENOSCOPY (EGD) WITH PROPOFOL;  Surgeon: Scot Jun, MD;  Location: [facility] ENDOSCOPY;  Service: Endoscopy;  Laterality: N/A;   EYE SURGERY Bilateral 1983, 1985   cataract extractions   SPINAL CORD STIMULATOR INSERTION N/A 03/13/2018   Procedure: SPINAL CORD STIMULATOR INSERTION;  Surgeon: Venetia Night, MD;  Location: [facility] ORS;  Service: Neurosurgery;  Laterality: N/A;    Social History   Socioeconomic History   Marital status: Married     Spouse name: dorothy   Number of children: 2   Years of education: Not on file   Highest education level: Some college, no degree  Occupational History   Occupation: maintenance / repair    Comment: disabled  Tobacco Use   Smoking status: Every Day    Types: Cigars   Smokeless tobacco: Never   Tobacco comments:    unable to give cessation materials due to webex visit   Vaping Use   Vaping status: Never Used  Substance and Sexual Activity   Alcohol use: No   Drug use: Yes    Types: Oxycodone   Sexual activity: Not Currently  Other Topics Concern   Not on file  Social History Narrative   Not on file   Social Drivers of Health   Financial Resource Strain: Low Risk  (01/22/2023)   Received from [facility]   Overall Financial Resource Strain (CARDIA)    Difficulty of Paying Living Expenses: Not hard at all  Food Insecurity: No Food Insecurity (01/22/2023)   Received from [facility]   Hunger Vital Sign    Worried About Running Out of Food in the Last Year: Never true    Ran Out of Food in the Last Year: Never true  Transportation Needs: No  Transportation Needs (07/17/2022)   PRAPARE - Administrator, Civil Service (Medical): No    Lack of Transportation (Non-Medical): No  Physical Activity: Not on file  Stress: Not on file  Social Connections: Not on file  Intimate Partner Violence: Not At Risk (07/17/2022)   Humiliation, Afraid, Rape, and Kick questionnaire    Fear of Current or Ex-Partner: No    Emotionally Abused: No    Physically Abused: No    Sexually Abused: No    Family History  Problem Relation Age of Onset   Heart disease Mother    Diabetes Father    Kidney cancer Brother     Allergies  Allergen Reactions   Wellbutrin [Bupropion]     hallucinations   Doxycycline Nausea Only   Ibuprofen Nausea Only   Sulfa Antibiotics Nausea Only    Outpatient Medications Prior to Visit  Medication Sig    acetaminophen (TYLENOL) 500 MG tablet Take 500-1,000 mg by mouth 2 (two) times daily as needed for mild pain.   allopurinol (ZYLOPRIM) 100 MG tablet TAKE 1 TABLET BY MOUTH EVERY OTHER DAY   amLODipine (NORVASC) 10 MG tablet TAKE 1 TABLET BY MOUTH EVERY DAY   aspirin 81 MG tablet Take 81 mg by mouth daily.   atorvastatin (LIPITOR) 10 MG tablet TAKE 1 TABLET BY MOUTH EVERYDAY AT BEDTIME   Cholecalciferol 50 MCG (2000 UT) TABS Take by mouth.   famotidine (PEPCID) 20 MG tablet Take 1 tablet by mouth 2 (two) times daily.   fluticasone (FLONASE) 50 MCG/ACT nasal spray Place 1-2 sprays into both nostrils daily as needed for allergies or rhinitis.   gabapentin (NEURONTIN) 600 MG tablet TAKE 1 TABLET BY MOUTH TWICE DAILY.   hydrOXYzine (VISTARIL) 25 MG capsule Take 25 mg by mouth 3 (three) times daily as needed.   isosorbide mononitrate (IMDUR) 30 MG 24 hr tablet TAKE 1 TABLET BY MOUTH EVERY DAY   losartan (COZAAR) 100 MG tablet TAKE 1 TABLET BY MOUTH EVERY DAY   mirabegron ER (MYRBETRIQ) 50 MG TB24 tablet Take 1 tablet (50 mg total) by mouth daily. (Patient taking differently: Take 25 mg by mouth daily.)   Oxycodone HCl 10 MG TABS Take 1 tablet (10 mg total) by mouth every 6 (six) hours as needed. Must last 30 days.   [START ON 04/05/2023] Oxycodone HCl 10 MG TABS Take 1 tablet (10 mg total) by mouth every 6 (six) hours as needed. Must last 30 days.   [START ON 05/05/2023] Oxycodone HCl 10 MG TABS Take 1 tablet (10 mg total) by mouth every 6 (six) hours as needed. Must last 30 days.   sertraline (ZOLOFT) 50 MG tablet Take 0.5 tablets (25 mg total) by mouth 2 (two) times daily. (Patient taking differently: Take 25 mg by mouth 2 (two) times daily. 12.5mg  in am and 12.5mg  in pm)   Testosterone 40.5 MG/2.5GM (1.62%) GEL Place 2 Pump onto the skin 3 (three) times a week.   vitamin B-12 (CYANOCOBALAMIN) 1000 MCG tablet Take 1,000 mcg by mouth daily.   Vitamin D, Ergocalciferol, (DRISDOL) 1.25 MG (50000 UNIT) CAPS  capsule TAKE 1 CAPSULE BY MOUTH ONE TIME PER WEEK   vitamin E 400 UNIT capsule Take 400 Units by mouth daily.   [DISCONTINUED] levothyroxine (SYNTHROID) 150 MCG tablet Take 1 tablet (150 mcg total) by mouth daily.   [DISCONTINUED] ondansetron (ZOFRAN-ODT) 4 MG disintegrating tablet Take 1 tablet (4 mg total) by mouth in the morning, at noon, in the evening, and  at bedtime.   amLODipine (NORVASC) 5 MG tablet Take 2.5 mg by mouth daily. (Patient not taking: Reported on 03/07/2023)   hydrOXYzine (ATARAX) 10 MG tablet Take 1 tablet (10 mg total) by mouth 2 (two) times daily as needed. (Patient not taking: Reported on 03/07/2023)   naloxone [facility]) nasal spray 4 mg/0.1 mL Place 1 spray into the nose as needed for up to 365 doses (for opioid-induced respiratory depresssion). In case of emergency (overdose), spray once into each nostril. If no response within 3 minutes, repeat application and call 911. (Patient not taking: Reported on 03/07/2023)   pantoprazole (PROTONIX) 20 MG tablet TAKE 1 TABLET BY MOUTH EVERY DAY (Patient not taking: Reported on 03/07/2023)   tamsulosin (FLOMAX) 0.4 MG CAPS capsule Take 0.4 mg by mouth daily. (Patient not taking: Reported on 03/07/2023)   No facility-administered medications prior to visit.    Review of Systems  Constitutional: Negative.   HENT: Negative.    Eyes: Negative.   Cardiovascular: Negative.   Gastrointestinal:  Positive for nausea.  Genitourinary: Negative.   Skin: Negative.   Neurological: Negative.   Endo/Heme/Allergies: Negative.   Psychiatric/Behavioral:  Positive for depression. The patient is nervous/anxious.        Objective:   BP 118/60   Pulse 65   Temp 98.6 F (37 C) (Tympanic)   Ht 6' (1.829 m)   Wt 195 lb (88.5 kg)   SpO2 92%   BMI 26.45 kg/m   Vitals:   03/07/23 1120  BP: 118/60  Pulse: 65  Temp: 98.6 F (37 C)  Height: 6' (1.829 m)  Weight: 195 lb (88.5 kg)  SpO2: 92%  TempSrc: Tympanic  BMI (Calculated): 26.44     Physical Exam Vitals reviewed.  Constitutional:      Appearance: Normal appearance.  HENT:     Head: Normocephalic.     Left Ear: There is no impacted cerumen.     Nose: Nose normal.     Mouth/Throat:     Mouth: Mucous membranes are dry.     Pharynx: No posterior oropharyngeal erythema.  Eyes:     Extraocular Movements: Extraocular movements intact.     Pupils: Pupils are equal, round, and reactive to light.  Cardiovascular:     Rate and Rhythm: Regular rhythm.     Chest Wall: PMI is not displaced.     Pulses: Normal pulses.     Heart sounds: Normal heart sounds. No murmur heard. Pulmonary:     Effort: Pulmonary effort is normal.     Breath sounds: Normal air entry. No rhonchi or rales.  Abdominal:     General: Abdomen is flat. Bowel sounds are normal. There is no distension.     Palpations: Abdomen is soft. There is no hepatomegaly, splenomegaly or mass.     Tenderness: There is no abdominal tenderness.  Musculoskeletal:        General: Normal range of motion.     Cervical back: Normal range of motion and neck supple.     Right lower leg: No edema.     Left lower leg: No edema.  Skin:    General: Skin is warm and dry.     Findings: Signs of injury (right ankle pressure injury) present.     Comments: Decreased skin turgor  Neurological:     General: No focal deficit present.     Mental Status: He is alert and oriented to person, place, and time.     Cranial Nerves: No cranial nerve  deficit.     Motor: No weakness.  Psychiatric:        Mood and Affect: Mood normal.        Behavior: Behavior normal.      No results found for any visits on 03/07/23.  Recent Results (from the past 2160 hours)  Testosterone     Status: Abnormal   Collection Time: 01/11/23 10:12 AM  Result Value Ref Range   Testosterone 90 (L) 264 - 916 ng/dL    Comment: Adult male reference interval is based on a population of healthy nonobese males (BMI <30) between 89 and 32 years  old. Travison, et.al. JCEM [phone]. PMID: [id number].   Comprehensive metabolic panel     Status: Abnormal   Collection Time: 03/02/23  8:41 AM  Result Value Ref Range   Glucose 134 (H) 70 - 99 mg/dL   BUN 14 8 - 27 mg/dL   Creatinine, Ser 6.21 0.76 - 1.27 mg/dL   eGFR 62 >30 QM/VHQ/4.69   BUN/Creatinine Ratio 12 10 - 24   Sodium 144 134 - 144 mmol/L   Potassium 5.1 3.5 - 5.2 mmol/L   Chloride 104 96 - 106 mmol/L   CO2 26 20 - 29 mmol/L   Calcium 8.6 8.6 - 10.2 mg/dL   Total Protein 5.9 (L) 6.0 - 8.5 g/dL   Albumin 3.7 3.7 - 4.7 g/dL   Globulin, Total 2.2 1.5 - 4.5 g/dL   Bilirubin Total 0.4 0.0 - 1.2 mg/dL   Alkaline Phosphatase 110 44 - 121 IU/L   AST 16 0 - 40 IU/L   ALT 10 0 - 44 IU/L  Lipid panel     Status: Abnormal   Collection Time: 03/02/23  8:41 AM  Result Value Ref Range   Cholesterol, Total 92 (L) 100 - 199 mg/dL   Triglycerides 629 0 - 149 mg/dL   HDL 30 (L) >52 mg/dL   VLDL Cholesterol Cal 20 5 - 40 mg/dL   LDL Chol Calc (NIH) 42 0 - 99 mg/dL   Chol/HDL Ratio 3.1 0.0 - 5.0 ratio    Comment:                                   T. Chol/HDL Ratio                                             Men  Women                               1/2 Avg.Risk  3.4    3.3                                   Avg.Risk  5.0    4.4                                2X Avg.Risk  9.6    7.1                                3X Avg.Risk 23.4   11.0  TSH     Status: None   Collection Time: 03/02/23  8:41 AM  Result Value Ref Range   TSH 1.290 0.450 - 4.500 uIU/mL  CBC With Diff/Platelet     Status: Abnormal   Collection Time: 03/02/23  8:41 AM  Result Value Ref Range   WBC 5.8 3.4 - 10.8 x10E3/uL   RBC 4.69 4.14 - 5.80 x10E6/uL   Hemoglobin 12.2 (L) 13.0 - 17.7 g/dL   Hematocrit 16.1 09.6 - 51.0 %   MCV 81 79 - 97 fL   MCH 26.0 (L) 26.6 - 33.0 pg   MCHC 32.0 31.5 - 35.7 g/dL   RDW 04.5 40.9 - 81.1 %   Platelets 160 150 - 450 x10E3/uL   Neutrophils 47 Not Estab. %   Lymphs 34  Not Estab. %   Monocytes 11 Not Estab. %   Eos 7 Not Estab. %   Basos 1 Not Estab. %   Neutrophils Absolute 2.8 1.4 - 7.0 x10E3/uL   Lymphocytes Absolute 1.9 0.7 - 3.1 x10E3/uL   Monocytes Absolute 0.6 0.1 - 0.9 x10E3/uL   EOS (ABSOLUTE) 0.4 0.0 - 0.4 x10E3/uL   Basophils Absolute 0.0 0.0 - 0.2 x10E3/uL   Immature Granulocytes 0 Not Estab. %   Immature Grans (Abs) 0.0 0.0 - 0.1 x10E3/uL      Assessment & Plan:  As per problem list  Problem List Items Addressed This Visit       Digestive   Gastro-esophageal reflux disease with esophagitis, without bleeding   Relevant Medications   ondansetron (ZOFRAN-ODT) 4 MG disintegrating tablet     Endocrine   Hypothyroidism   Relevant Medications   levothyroxine (SYNTHROID) 150 MCG tablet     Other   Mixed hyperlipidemia   Other Visit Diagnoses       Primary hypertension    -  Primary       Return in about 3 months (around 06/05/2023) for fu with labs prior.   Total time spent: 20 minutes  Luna Fuse, MD  03/07/2023   This document may have been prepared by [facility] Voice Recognition software and as such may include unintentional dictation errors.

## 2023-03-09 ENCOUNTER — Other Ambulatory Visit: Payer: Self-pay | Admitting: Urology

## 2023-03-09 DIAGNOSIS — E291 Testicular hypofunction: Secondary | ICD-10-CM

## 2023-03-09 MED ORDER — TESTOSTERONE 20.25 MG/ACT (1.62%) TD GEL: TRANSDERMAL | 1 refills | Status: DC

## 2023-03-09 NOTE — Telephone Encounter (Signed)
 Notified patient as instructed, patient pleased. Discussed follow-up appointments, patient agrees

## 2023-03-10 ENCOUNTER — Other Ambulatory Visit: Payer: Self-pay | Admitting: Psychiatry

## 2023-03-10 DIAGNOSIS — F411 Generalized anxiety disorder: Secondary | ICD-10-CM

## 2023-03-12 ENCOUNTER — Ambulatory Visit (INDEPENDENT_AMBULATORY_CARE_PROVIDER_SITE_OTHER): Payer: PRIVATE HEALTH INSURANCE | Admitting: Licensed Clinical Social Worker

## 2023-03-12 ENCOUNTER — Other Ambulatory Visit: Payer: Self-pay | Admitting: Urology

## 2023-03-12 DIAGNOSIS — F331 Major depressive disorder, recurrent, moderate: Secondary | ICD-10-CM

## 2023-03-12 DIAGNOSIS — Z634 Disappearance and death of family member: Secondary | ICD-10-CM | POA: Diagnosis not present

## 2023-03-12 DIAGNOSIS — F411 Generalized anxiety disorder: Secondary | ICD-10-CM | POA: Diagnosis not present

## 2023-03-12 DIAGNOSIS — E291 Testicular hypofunction: Secondary | ICD-10-CM

## 2023-03-12 MED ORDER — TESTOSTERONE 10 MG/ACT (2%) TD GEL: 10.0000 mg | Freq: Every day | TRANSDERMAL | 3 refills | Status: DC

## 2023-03-12 NOTE — Progress Notes (Signed)
THERAPIST PROGRESS NOTE  Session Time: 11-11:43am  Participation Level: Active  Behavioral Response: CasualAlertNegative and Euthymic  Type of Therapy: Individual Therapy  Treatment Goals addressed: Reduce overall frequency, intensity and duration of anxiety so that daily functioning is not impaired per pt self report 3 out of 5 sessions.    Reduce overall frequency, intensity and duration of depression so that daily functioning is not impaired per pt self report 3 out of 5 sessions documented.   ProgressTowards Goals: Progressing  Interventions: CBT, Supportive, and Reframing  Summary: Chase Mendez is a 89 y.o. male who presents with symptoms of depression citing lack of motivation, low mood, anhedonia, negative self affect. Pt was oriented times 5. Pt was cooperative and engaged. Pt denies SI/HI/AVH.     Patient utilized therapeutic space to process recent outing "getting back into the church."  Continued to reflect on differences in participation due to vision loss.  Clinician acknowledges improvement of patient reframing thought patterns in session about inability to change past experiences.  Patient reflected on recent conversation with his biological daughter and steps he is making to improve relationships with his children.  Discussed steps and readiness to reach out to his son in order to improve their relationship.  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  Patient utilized most of therapy to process patterns of thinking about a different life and how alternative life choices could have impacted him today.  Clinician attempted to redirect patient's thought patterns and shift to gratitude for the present moment.  Patient struggled with this shift and referenced misplaced guilt about his wife's death.  Clinician worked with the patient to challenge this thought about uncontrollable factors and  reframe to controllable factors.  Patient identified anxiety worrying about his wife's health.  Addressed ways in which patient can control certain dynamics and ways he can lean on his faith during times of uncertainty.    Patient identified a future goal of getting back into meetings at the [employer].  Worked with clinician to identify steps he can take to accomplish his goal.    Clinician assigned the homework to patient practicing gratitude daily to shift perspective to present moment.  Suicidal/Homicidal: Nowithout intent/plan  Therapist Response: Cln utilized active and supportive reflection to create an environment for the patient to process recent life stressors and symptoms.  The clinician assessed for stressors, symptoms, and safety since the last session.  Worked through CBT to help the patient reframe unhelpful thinking patterns about changing the past and misplaced guilt.  Addressed ways in which the patient can allow for disappointment, sadness, or grief.    Plan: Return again in 4 weeks.  Diagnosis: MDD (major depressive disorder), recurrent episode, moderate (HCC)  GAD (generalized anxiety disorder)  Bereavement   Collaboration of Care: AEB psychiatrist can access notes and cln. Will review psychiatrists' notes. Check in with the patient and will see LCSW per availability. Patient agreed with treatment recommendations.  Patient/Guardian was advised Release of Information must be obtained prior to any record release in order to collaborate their care with an outside provider. Patient/Guardian was advised if they have not already done so to contact the registration department to sign all necessary forms in order for [place] to release information regarding their care.   Consent: Patient/Guardian gives verbal consent for treatment and assignment of benefits for services provided during this visit. Patient/Guardian expressed understanding and agreed to proceed.  Dereck Leep, LCSW 03/12/2023

## 2023-04-04 DIAGNOSIS — C44319 Basal cell carcinoma of skin of other parts of face: Secondary | ICD-10-CM | POA: Diagnosis not present

## 2023-04-04 DIAGNOSIS — D225 Melanocytic nevi of trunk: Secondary | ICD-10-CM | POA: Diagnosis not present

## 2023-04-04 DIAGNOSIS — C44519 Basal cell carcinoma of skin of other part of trunk: Secondary | ICD-10-CM | POA: Diagnosis not present

## 2023-04-04 DIAGNOSIS — D485 Neoplasm of uncertain behavior of skin: Secondary | ICD-10-CM | POA: Diagnosis not present

## 2023-04-04 DIAGNOSIS — Z85828 Personal history of other malignant neoplasm of skin: Secondary | ICD-10-CM | POA: Diagnosis not present

## 2023-04-04 DIAGNOSIS — D2261 Melanocytic nevi of right upper limb, including shoulder: Secondary | ICD-10-CM | POA: Diagnosis not present

## 2023-04-04 DIAGNOSIS — D2272 Melanocytic nevi of left lower limb, including hip: Secondary | ICD-10-CM | POA: Diagnosis not present

## 2023-04-04 DIAGNOSIS — D2371 Other benign neoplasm of skin of right lower limb, including hip: Secondary | ICD-10-CM | POA: Diagnosis not present

## 2023-04-04 DIAGNOSIS — D2262 Melanocytic nevi of left upper limb, including shoulder: Secondary | ICD-10-CM | POA: Diagnosis not present

## 2023-04-04 DIAGNOSIS — L570 Actinic keratosis: Secondary | ICD-10-CM | POA: Diagnosis not present

## 2023-04-10 ENCOUNTER — Ambulatory Visit (INDEPENDENT_AMBULATORY_CARE_PROVIDER_SITE_OTHER): Payer: PRIVATE HEALTH INSURANCE | Admitting: Licensed Clinical Social Worker

## 2023-04-10 DIAGNOSIS — F331 Major depressive disorder, recurrent, moderate: Secondary | ICD-10-CM

## 2023-04-10 DIAGNOSIS — Z634 Disappearance and death of family member: Secondary | ICD-10-CM | POA: Diagnosis not present

## 2023-04-10 DIAGNOSIS — F411 Generalized anxiety disorder: Secondary | ICD-10-CM | POA: Diagnosis not present

## 2023-04-10 NOTE — Progress Notes (Signed)
   THERAPIST PROGRESS NOTE  Session Time: 11:01am-11:47am  Participation Level: Active  Behavioral Response: CasualAlertEuthymic  Type of Therapy: Individual Therapy  Treatment Goals addressed: Reduce overall frequency, intensity and duration of anxiety so that daily functioning is not impaired per pt self report 3 out of 5 sessions.    Reduce overall frequency, intensity and duration of depression so that daily functioning is not impaired per pt self report 3 out of 5 sessions documented.   ProgressTowards Goals: Progressing  Interventions: CBT, Supportive, Reframing, and Other: Mindfulness  Summary: Chase Mendez is a 89 y.o. male who presents with symptoms of depression citing lack of motivation, low mood, anhedonia, negative self affect. Pt was oriented times 5. Pt was cooperative and engaged. Pt denies SI/HI/AVH.   The patient spent much of the therapy session exploring his thought patterns regarding an alternate life and the ways different choices could have influenced his current situation. The clinician aimed to redirect the patient's focus toward accpeting his past and addressing his misplaced guilt. Discussed strategies the patient could adopt to manage certain dynamics and how he could seek out further support instea dof isolation as he is greiving the recent death of friends.   Additionally, the clinician introduced a grounding activity involving the five senses to help the patient learn how to regulate his attention.   Suicidal/Homicidal: Nowithout intent/plan  Therapist Response: Cln utilized active and supportive reflection to create an environment for the patient to process recent life stressors and symptoms. The clinician assessed for stressors, symptoms, and safety since the last session. Worked through CBT to help the patient reframe unhelpful thinking patterns about changing the past and misplaced guilt. Addressed ways in which the patient can seek out further support  to address his grief.   Plan: Return again in 4 weeks.  Diagnosis: MDD (major depressive disorder), recurrent episode, moderate (HCC)  GAD (generalized anxiety disorder)  Bereavement   Collaboration of Care: AEB psychiatrist can access notes and cln. Will review psychiatrists' notes. Check in with the patient and will see LCSW per availability. Patient agreed with treatment recommendations.   Patient/Guardian was advised Release of Information must be obtained prior to any record release in order to collaborate their care with an outside provider. Patient/Guardian was advised if they have not already done so to contact the registration department to sign all necessary forms in order for [place] to release information regarding their care.   Consent: Patient/Guardian gives verbal consent for treatment and assignment of benefits for services provided during this visit. Patient/Guardian expressed understanding and agreed to proceed.   Dereck Leep, LCSW 04/10/2023

## 2023-04-23 ENCOUNTER — Other Ambulatory Visit: Payer: Self-pay

## 2023-04-23 DIAGNOSIS — E291 Testicular hypofunction: Secondary | ICD-10-CM

## 2023-04-25 ENCOUNTER — Encounter: Payer: Self-pay | Admitting: Psychiatry

## 2023-04-25 ENCOUNTER — Ambulatory Visit (INDEPENDENT_AMBULATORY_CARE_PROVIDER_SITE_OTHER): Payer: PRIVATE HEALTH INSURANCE | Admitting: Psychiatry

## 2023-04-25 VITALS — BP 110/76 | HR 83 | Temp 98.3°F | Ht 72.0 in | Wt 195.2 lb

## 2023-04-25 DIAGNOSIS — R419 Unspecified symptoms and signs involving cognitive functions and awareness: Secondary | ICD-10-CM | POA: Diagnosis not present

## 2023-04-25 DIAGNOSIS — F3341 Major depressive disorder, recurrent, in partial remission: Secondary | ICD-10-CM

## 2023-04-25 DIAGNOSIS — Z634 Disappearance and death of family member: Secondary | ICD-10-CM | POA: Diagnosis not present

## 2023-04-25 DIAGNOSIS — F411 Generalized anxiety disorder: Secondary | ICD-10-CM

## 2023-04-25 MED ORDER — HYDROXYZINE PAMOATE 25 MG PO CAPS: 25.0000 mg | ORAL_CAPSULE | Freq: Every day | ORAL | 1 refills | Status: DC | PRN

## 2023-04-25 NOTE — Progress Notes (Unsigned)
 [facility] MD OP Progress Note  04/25/2023 12:27 PM Chase Mendez  MRN:  [id number]  Chief Complaint:  Chief Complaint  Patient presents with   Follow-up   Anxiety   Depression   Medication Refill   HPI: Chase Mendez is a 89 year old Caucasian male, veteran, retired from [employer], lives in [place] with his wife, has a history of MDD, GAD, chronic pain, bereavement, foot drop left sided, hypothyroidism, hypertension, history of frequent falls was evaluated in office today.  Collateral information attempted to be obtained from wife, Chase Mendez who reported no concerns today except that patient is in denial about his vision loss which he has been struggling with for a long time.  He has a history of depression and anxiety and is currently taking sertraline.  Denies side effects from sertraline.  He is now taking hydroxyzine 10 mg once daily, which he finds ineffective.  He is interested in increasing the dosage to a 25 milligram.  Denies thoughts of self-harm or harm to others.  He describes experiencing a couple of falls, attributing them to losing balance while reaching for something. He did not sustain any injuries from these incidents. Family members, including wife Chase Mendez and his stepdaughter, assisted him after one of the falls.  His vision has improved since taking Prolastin, which he takes every morning and noon. He is able to watch television without difficulty.  He acknowledges some difficulty with hearing, as he did not wear his hearing aids during the visit.  His daily routine includes watching television and eating well. Chase Mendez prepares meals for him, and he enjoys eating eggs and cheese in the morning. He reports a good appetite and adequate sleep.  He appeared to be alert, oriented to person, place, time.  He was able to give the month as March, day as Wednesday although he could not give the year or the date.  Remote memory immediate 3 out of 3, after 5 minutes 3 out of 3.   Due to vision loss and not having his hearing aid unable to do further screening of memory today.  Wife denies any changes.  Visit Diagnosis:    ICD-10-CM   1. MDD (major depressive disorder), recurrent, in partial remission (HCC)  F33.41 hydrOXYzine (VISTARIL) 25 MG capsule    2. GAD (generalized anxiety disorder)  F41.1 hydrOXYzine (VISTARIL) 25 MG capsule    3. Neurocognitive disorder  R41.9    Likely mild    4. Bereavement  Z63.4       Past Psychiatric History: I have reviewed past psychiatric history from progress note on 09/07/2022.  Past trials of medications like Vraylar (cost), Remeron, Celexa, risperidone, Cymbalta, Wellbutrin.  Past Medical History:  Past Medical History:  Diagnosis Date   Acute encephalopathy 12/08/2014   Anxiety    ARF (acute renal failure) (HCC) 12/08/2014   Back pain    Benign neoplasm of large bowel    Capsulitis    fractured displaced metatarsal with capsulitis   Chronic back pain    Coronavirus infection    Depression    Diabetes mellitus without complication (HCC)    no medications currently   Dysphagia    Exostosis    painful, right hallux   Foot drop, left    wears a brace   Frequent falls 02/2018   poor balance, foot drop   GERD (gastroesophageal reflux disease)    Gout    Hypertension    Hypothyroidism    Insomnia    Low  testosterone    Microscopic hematuria 2016   Myocardial infarction [facility])    patient unaware when it happened years ago.     Pneumonia 12/07/2014   Pressure ulcer 12/09/2014   Sepsis (HCC) 12/08/2014   Thyroid disease     Past Surgical History:  Procedure Laterality Date   APPENDECTOMY     BACK SURGERY  1995, 1996   x 2   CHOLECYSTECTOMY     ESOPHAGOGASTRODUODENOSCOPY (EGD) WITH PROPOFOL N/A 04/24/2017   Procedure: ESOPHAGOGASTRODUODENOSCOPY (EGD) WITH PROPOFOL;  Surgeon: Scot Jun, MD;  Location: [facility] ENDOSCOPY;  Service: Endoscopy;  Laterality: N/A;   EYE SURGERY Bilateral 1983, 1985   cataract  extractions   SPINAL CORD STIMULATOR INSERTION N/A 03/13/2018   Procedure: SPINAL CORD STIMULATOR INSERTION;  Surgeon: Venetia Night, MD;  Location: [facility] ORS;  Service: Neurosurgery;  Laterality: N/A;    Family Psychiatric History: I have reviewed family psychiatric history from progress note on 09/07/2022.  Family History:  Family History  Problem Relation Age of Onset   Heart disease Mother    Diabetes Father    Kidney cancer Brother     Social History: I have reviewed social history from progress note on 09/07/2022. Social History   Socioeconomic History   Marital status: Married    Spouse name: Chase Mendez   Number of children: 2   Years of education: Not on file   Highest education level: Some college, no degree  Occupational History   Occupation: maintenance / repair    Comment: disabled  Tobacco Use   Smoking status: Every Day    Types: Cigars   Smokeless tobacco: Never   Tobacco comments:    unable to give cessation materials due to webex visit   Vaping Use   Vaping status: Never Used  Substance and Sexual Activity   Alcohol use: No   Drug use: Yes    Types: Oxycodone   Sexual activity: Not Currently  Other Topics Concern   Not on file  Social History Narrative   Not on file   Social Drivers of Health   Financial Resource Strain: Low Risk  (01/22/2023)   Received from [facility]   Overall Financial Resource Strain (CARDIA)    Difficulty of Paying Living Expenses: Not hard at all  Food Insecurity: No Food Insecurity (01/22/2023)   [facility] from [facility]   Hunger Vital Sign    Worried About Running Out of Food in the Last Year: Never true    Ran Out of Food in the Last Year: Never true  Transportation Needs: No Transportation Needs (07/17/2022)   PRAPARE - Administrator, Civil Service (Medical): No    Lack of Transportation (Non-Medical): No  Physical Activity: Not on file  Stress: Not on file  Social  Connections: Not on file    Allergies:  Allergies  Allergen Reactions   Wellbutrin [Bupropion]     hallucinations   Doxycycline Nausea Only   Ibuprofen Nausea Only   Sulfa Antibiotics Nausea Only    Metabolic Disorder Labs: Lab Results  Component Value Date   HGBA1C 5.6 07/18/2022   MPG 114.02 07/18/2022   No results found for: "PROLACTIN" Lab Results  Component Value Date   CHOL 92 (L) 03/02/2023   TRIG 106 03/02/2023   HDL 30 (L) 03/02/2023   CHOLHDL 3.1 03/02/2023   VLDL 23 12/08/2014   LDLCALC 42 03/02/2023   LDLCALC 52 11/29/2022   Lab Results  Component Value  Date   TSH 1.290 03/02/2023   TSH 2.580 11/29/2022    Therapeutic Level Labs: No results found for: "LITHIUM" No results found for: "VALPROATE" No results found for: "CBMZ"  Current Medications: Current Outpatient Medications  Medication Sig Dispense Refill   acetaminophen (TYLENOL) 500 MG tablet Take 500-1,000 mg by mouth 2 (two) times daily as needed for mild pain.     allopurinol (ZYLOPRIM) 100 MG tablet TAKE 1 TABLET BY MOUTH EVERY OTHER DAY 45 tablet 1   amLODipine (NORVASC) 10 MG tablet TAKE 1 TABLET BY MOUTH EVERY DAY 90 tablet 1   amLODipine (NORVASC) 5 MG tablet Take 2.5 mg by mouth daily.     aspirin 81 MG tablet Take 81 mg by mouth daily.     atorvastatin (LIPITOR) 10 MG tablet TAKE 1 TABLET BY MOUTH EVERYDAY AT BEDTIME 90 tablet 1   Cholecalciferol 50 MCG (2000 UT) TABS Take by mouth.     famotidine (PEPCID) 20 MG tablet Take 1 tablet by mouth 2 (two) times daily.     fluticasone (FLONASE) 50 MCG/ACT nasal spray Place 1-2 sprays into both nostrils daily as needed for allergies or rhinitis.     gabapentin (NEURONTIN) 600 MG tablet TAKE 1 TABLET BY MOUTH TWICE DAILY. 180 tablet 1   hydrOXYzine (ATARAX) 10 MG tablet TAKE ONE TABLET BY MOUTH TWICE DAILY AS NEEDED 60 tablet 1   hydrOXYzine (VISTARIL) 25 MG capsule Take 1 capsule (25 mg total) by mouth daily as needed for anxiety. Dose change ,  stop 10 mg 30 capsule 1   isosorbide mononitrate (IMDUR) 30 MG 24 hr tablet TAKE 1 TABLET BY MOUTH EVERY DAY 90 tablet 0   levothyroxine (SYNTHROID) 150 MCG tablet Take 1 tablet (150 mcg total) by mouth daily. 90 tablet 1   losartan (COZAAR) 100 MG tablet TAKE 1 TABLET BY MOUTH EVERY DAY 90 tablet 1   mirabegron ER (MYRBETRIQ) 50 MG TB24 tablet Take 1 tablet (50 mg total) by mouth daily. (Patient taking differently: Take 25 mg by mouth daily.) 30 tablet 11   naloxone (NARCAN) nasal spray 4 mg/0.1 mL Place 1 spray into the nose as needed for up to 365 doses (for opioid-induced respiratory depresssion). In case of emergency (overdose), spray once into each nostril. If no response within 3 minutes, repeat application and call 911. 1 each 0   ondansetron (ZOFRAN-ODT) 4 MG disintegrating tablet Take 1 tablet (4 mg total) by mouth in the morning, at noon, in the evening, and at bedtime. 90 tablet 3   Oxycodone HCl 10 MG TABS Take 1 tablet (10 mg total) by mouth every 6 (six) hours as needed. Must last 30 days. 120 tablet 0   [START ON 05/05/2023] Oxycodone HCl 10 MG TABS Take 1 tablet (10 mg total) by mouth every 6 (six) hours as needed. Must last 30 days. 120 tablet 0   pantoprazole (PROTONIX) 20 MG tablet TAKE 1 TABLET BY MOUTH EVERY DAY 90 tablet 1   sertraline (ZOLOFT) 50 MG tablet Take 0.5 tablets (25 mg total) by mouth 2 (two) times daily. (Patient taking differently: Take 25 mg by mouth 2 (two) times daily. 12.5mg  in am and 12.5mg  in pm) 30 tablet 5   tamsulosin (FLOMAX) 0.4 MG CAPS capsule Take 0.4 mg by mouth daily.     Testosterone 10 MG/ACT (2%) GEL Place 10 mg onto the skin daily. 60 g 3   vitamin B-12 (CYANOCOBALAMIN) 1000 MCG tablet Take 1,000 mcg by mouth daily.  Vitamin D, Ergocalciferol, (DRISDOL) 1.25 MG (50000 UNIT) CAPS capsule TAKE 1 CAPSULE BY MOUTH ONE TIME PER WEEK 12 capsule 3   vitamin E 400 UNIT capsule Take 400 Units by mouth daily.     Oxycodone HCl 10 MG TABS Take 1 tablet  (10 mg total) by mouth every 6 (six) hours as needed. Must last 30 days. 120 tablet 0   No current facility-administered medications for this visit.     Musculoskeletal: Strength & Muscle Tone: within normal limits Gait & Station:  unsteady - baseline - walks with walker Patient leans: N/A  Psychiatric Specialty Exam: Review of Systems  Psychiatric/Behavioral:  The patient is nervous/anxious.     Blood pressure 110/76, pulse 83, temperature 98.3 F (36.8 C), temperature source Temporal, height 6' (1.829 m), weight 195 lb 3.2 oz (88.5 kg), SpO2 97%.Body mass index is 26.47 kg/m.  General Appearance: Casual  Eye Contact:  Fair  Speech:  Clear and Coherent  Volume:  Normal  Mood:  Anxious  Affect:  Congruent  Thought Process:  Goal Directed and Descriptions of Associations: Intact  Orientation:  Full (Time, Place, and Person)  Thought Content: Logical   Suicidal Thoughts:  No  Homicidal Thoughts:  No  Memory:  Immediate;   Fair Recent;   Fair Remote;   Poor  Judgement:  Fair  Insight:  Fair  Psychomotor Activity:  Decreased  Concentration:  Concentration: Fair and Attention Span: Fair  Recall:  [employer] of Knowledge: Fair  Language: Fair  Akathisia:  No  Handed:  Right  AIMS (if indicated): not done  Assets:  Desire for Improvement Housing Social Support Transportation  ADL's:  Intact  Cognition: WNL  Sleep:  Fair   Screenings: GAD-7    Garment/textile technologist Visit from 01/24/2023 in [facility] Counselor from 12/11/2022 in [facility] Office Visit from 12/05/2022 in [facility] Office Visit from 10/25/2022 in [facility] Office Visit from 09/07/2022 in [facility]  Total GAD-7 Score 11 18 10 7 11       PHQ2-9    Flowsheet Row Office Visit from 01/24/2023 in [facility] Counselor from 12/11/2022 in [facility] Office Visit from 12/05/2022 in [facility] Office Visit from 12/04/2022 in [facility] Visit from 10/25/2022 in [facility]  PHQ-2 Total Score 5 5 6  0 5  PHQ-9 Total Score 12 21 17  -- 16      Flowsheet Row Office Visit from 01/24/2023 in [facility] Counselor from 12/11/2022 in [facility] Office Visit from 10/25/2022 in [facility]  C-SSRS RISK CATEGORY No Risk No Risk No Risk        Assessment and Plan: Chase Mendez is a 89 year old Caucasian male with a history of MDD, neurocognitive disorder was evaluated in office today.  Discussed assessment and plan as noted below.  Depression in partial remission Chronic depression with improved self-care and no current suicidal ideation. Continues sertraline (Zoloft) as prescribed. - Continue Sertraline (Zoloft) 50 mg, half tablet twice daily, as prescribed. - Assess mood, self-care behaviors, and suicidal ideation at each visit.  Anxiety-unstable Chronic anxiety managed with hydroxyzine (Vistaril). Prefers increasing hydroxyzine to 25 mg, previously effective. Concerns about fall risk with increased  dosage, especially given age and recent falls. Hydroxyzine to be taken at night to minimize fall risk. - Prescribe Hydroxyzine (Vistaril) 25 mg once daily at night. - Monitor for falls and reassess medication dosage if necessary. - Continue CBT with Ms. Doran Clay Denies any concerns today. - Continue CBT  Cognitive impairment-chronic likely mild Cognitive function assessed; difficulty with date recall but able to remember three words after a delay. - Monitor cognitive function  at future visits  Follow-up - Follow-up in clinic in 3 months or sooner if needed.   Collaboration of Care: Collaboration of Care: Referral or follow-up with counselor/therapist AEB patient encouraged to continue CBT  Patient/Guardian was advised Release of Information must be obtained prior to any record release in order to collaborate their care with an outside provider. Patient/Guardian was advised if they have not already done so to contact the registration department to sign all necessary forms in order for [place] to release information regarding their care.   Consent: Patient/Guardian gives verbal consent for treatment and assignment of benefits for services provided during this visit. Patient/Guardian expressed understanding and agreed to proceed.  Discussed the use of a AI scribe software for clinical note transcription with the patient, who gave verbal consent to proceed.  This note was generated in part or whole with voice recognition software. Voice recognition is usually quite accurate but there are transcription errors that can and very often do occur. I apologize for any typographical errors that were not detected and corrected.     Jomarie Longs, MD 04/25/2023, 12:27 PM

## 2023-04-27 ENCOUNTER — Other Ambulatory Visit: Payer: PRIVATE HEALTH INSURANCE

## 2023-04-27 DIAGNOSIS — E291 Testicular hypofunction: Secondary | ICD-10-CM | POA: Diagnosis not present

## 2023-04-28 LAB — TESTOSTERONE: Testosterone: 69 ng/dL — ABNORMAL LOW (ref 264–916)

## 2023-04-30 ENCOUNTER — Encounter: Payer: Self-pay | Admitting: *Deleted

## 2023-05-04 ENCOUNTER — Other Ambulatory Visit: Payer: Self-pay | Admitting: Cardiovascular Disease

## 2023-05-04 DIAGNOSIS — R0789 Other chest pain: Secondary | ICD-10-CM

## 2023-05-10 DIAGNOSIS — H6123 Impacted cerumen, bilateral: Secondary | ICD-10-CM | POA: Diagnosis not present

## 2023-05-10 DIAGNOSIS — H6061 Unspecified chronic otitis externa, right ear: Secondary | ICD-10-CM | POA: Diagnosis not present

## 2023-05-10 DIAGNOSIS — H60391 Other infective otitis externa, right ear: Secondary | ICD-10-CM | POA: Diagnosis not present

## 2023-05-15 ENCOUNTER — Ambulatory Visit (INDEPENDENT_AMBULATORY_CARE_PROVIDER_SITE_OTHER): Payer: PRIVATE HEALTH INSURANCE | Admitting: Licensed Clinical Social Worker

## 2023-05-15 DIAGNOSIS — R419 Unspecified symptoms and signs involving cognitive functions and awareness: Secondary | ICD-10-CM

## 2023-05-15 DIAGNOSIS — Z634 Disappearance and death of family member: Secondary | ICD-10-CM

## 2023-05-15 DIAGNOSIS — F3341 Major depressive disorder, recurrent, in partial remission: Secondary | ICD-10-CM

## 2023-05-15 NOTE — Progress Notes (Signed)
 THERAPIST PROGRESS NOTE  Session Time: 9-10am  Participation Level: Active  Behavioral Response: CasualAlertEuthymic  Type of Therapy: Individual Therapy  Treatment Goals addressed:  Template: Anxiety         Problem: Anxiety     Dates: Start:  01/10/22       Disciplines: Interdisciplinary, Counselor, PROVIDER        Goal: Anxiety  (Resolved)     Dates: Start:  01/10/22    Expected End:  05/11/23    Resolved:  05/15/23    Description: Reduce overall frequency, intensity and duration of anxiety so that daily functioning is not impaired per pt self report 3 out of 5 sessions.     05/15/23: Pt reports he feels his anxiety has improved. Shares he has progressed to accepting his circumstances.     Disciplines: Interdisciplinary, PROVIDER         Outcomes     Date/Time User Outcome    05/15/23 0911 Chase Mendez Mendez Completed/Met    06/07/22 0554 Chase Mendez Mendez Mendez    05/10/22 1554 Chase Mendez Mendez    04/11/22 1140 Chase Mendez Mendez    03/30/22 0847 Chase Mendez Mendez Mendez    03/07/22 1052 Chase Mendez Mendez    02/17/22 1026 Chase Mendez    01/24/22 1246 Chase Mendez Mendez    01/10/22 1205 Chase Mendez            Goal note from Counselor 05/15/2023 by Chase Leep, LCSW     05/15/23: Pt reports he feels his anxiety has improved. Shares he has progressed to accepting his circumstances.                     Goal: Anxiety  (Resolved)     Dates: Start:  01/10/22    Expected End:  05/11/23    Resolved:  05/15/23    Description: Resolve core conflicts that is the source of the anxiety per pt report 3 out of 5 sessions.      Disciplines: Interdisciplinary, PROVIDER         Outcomes     Date/Time User Outcome    05/15/23 0911 Chase Mendez Mendez Completed/Met    06/07/22 0554 Chase Mendez Mendez Mendez    05/10/22 1554 Chase Mendez Mendez    04/11/22 1140 Chase Mendez Mendez    03/30/22 0847 Chase Mendez Mendez Mendez    03/07/22 1052 Chase Mendez  Mendez    02/17/22 1026 Chase Mendez    01/24/22 1246 Chase Mendez Mendez    01/10/22 1205 Chase Mendez                  Intervention: Perform psychoeducation regarding anxiety disorders     Dates: Start:  01/10/22                Intervention: Work with patient individually to identify the major components of a recent episode of anxiety: physical symptoms, major thoughts and images, and major behaviors they experienced     Dates: Start:  01/10/22                Intervention: Work with Chase Mendez "Chase Mendez" to identify a minimum of 3 alternative coping behaviors to avoidance.      Dates: Start:  01/10/22                Intervention: Anxiety      Dates: Start:  01/10/22       Description: Will work with the pt  using CBT/DBT/REBT techniques to help the pt verbalize an understanding of the cognitive, physiological, and behavioral components of anxiety and its treatment. This will be done by using worksheets, interactive activities, CBT/ABC thought logs, modeling, homework, role playing and journaling. Will work with pt to  learn and implement coping skills that result in a reduction of anxiety and improve daily functioning per pt self report 3 out of 5 documented sessions.       Disciplines: Counselor                 Template: Depression (OP)         Problem:  Depression     Dates: Start:  01/10/22       Description: Reduce overall frequency, intensity and duration of depression so that daily functioning is not impaired per pt self report 3 out of 5 sessions documented.      Disciplines: Interdisciplinary, Counselor, PROVIDER        Goal: Depression  (Resolved)     Dates: Start:  01/10/22    Expected End:  05/11/23    Resolved:  05/15/23    Description: Recognize, accept and cope with feelings of depression per pt self report 3 out of 5 sessions.      Disciplines: Interdisciplinary, PROVIDER         Outcomes     Date/Time User Outcome    05/15/23 0911 Chase Mendez  Completed/Met    06/07/22 0554 Chase Mendez Mendez Mendez    05/10/22 1554 Chase Mendez Mendez    04/11/22 1140 Chase Mendez Mendez    03/30/22 0847 Chase Mendez Mendez Mendez    03/07/22 1052 Chase Mendez Mendez    02/17/22 1026 Chase Mendez    01/24/22 1246 Chase Mendez Mendez    01/10/22 1205 Chase Mendez            Goal note from Counselor 05/15/2023 by Chase Leep, LCSW     4/8/25Shella Mendez he feels he has been "doing pretty good." "I think I've got that under control."                     Goal: Depression  (Resolved)     Dates: Start:  01/10/22    Expected End:  05/11/23    Resolved:  05/15/23    Description: Develop healthy thinking patterns about self, others and beliefs about self to help alleviate symptoms of depression per pt report 3 out 5 sessions.      Disciplines: Interdisciplinary, PROVIDER         Outcomes     Date/Time User Outcome    05/15/23 0911 Chase Mendez Mendez Completed/Met    06/07/22 0554 Chase Mendez Mendez Mendez    05/10/22 1554 Chase Mendez Mendez    04/11/22 1140 Chase Mendez Mendez    03/30/22 0847 Chase Mendez Mendez Mendez    03/07/22 1052 Chase Mendez Mendez    02/17/22 1026 Chase Mendez    01/24/22 1246 Chase Mendez Mendez    01/10/22 1205 Chase Mendez                  Goal: Depression  (Resolved)     Dates: Start:  01/10/22    Expected End:  05/11/23    Resolved:  05/15/23    Description: Develop healthy interpersonal relationships to help prevent relapse of depression symptoms per pt report 3 out of 5 sessions.      Disciplines: Counselor  Outcomes     Date/Time User Outcome    05/15/23 0911 Chase Mendez Mendez Completed/Met    03/07/22 1052 Chase Mendez Mendez    02/17/22 1026 Carol Q Not Mendez    01/24/22 1246 Chase Mendez Mendez    01/10/22 1205 Chase Mendez                  Goal: Depression  (Resolved)     Dates: Start:  01/10/22    Expected End:  05/11/23    Resolved:  05/15/23     Description: Appropriately grief looses in order to normalize mood and improve symptoms of depression per pt report 3 out 5 sessions.      Disciplines: Counselor         Outcomes     Date/Time User Outcome    05/15/23 0911 Chase Mendez Mendez Completed/Met    03/07/22 1052 Carol Q Mendez    02/17/22 1026 Carol Q Not Mendez    01/24/22 1246 Chase Mendez Mendez    01/10/22 1205 Chase Mendez                  Goal: Grief/Loss (Resolved)     Dates: Start:  01/10/22    Expected End:  05/11/23    Resolved:  05/15/23    Description: Educate about the grieving process and the stages of grief and discuss issues of grief in session     Disciplines: Counselor         Outcomes     Date/Time User Outcome    05/15/23 0911 Byren Pankow Mendez Completed/Met    03/07/22 1052 Carol Q Mendez    02/17/22 1026 Carol Q Not Mendez    01/24/22 1246 Chase Mendez Mendez    01/10/22 1205 Carol Q Mendez                  Goal: STG - Misc 1 (Resolved)     Dates: Start:  12/11/22    Expected End:  05/11/23    Resolved:  05/15/23    Description: Pt reports he would like to find one way to "help around the house."    Disciplines: Counselor         Outcomes     Date/Time User Outcome    05/15/23 0911 Chase Mendez Mendez Completed/Met                  Goal: STG - Misc 1 (Resolved)     Dates: Start:  12/11/22    Expected End:  05/11/23    Resolved:  05/15/23    Description: Pt will attend one social outing a week (church service, visit friends at the [place], attend a [employer], etc.)    Disciplines: Counselor         Outcomes           ProgressTowards Goals: Mendez  Interventions: Strength-based and Supportive  Summary: Chase Mendez is a 89 y.o. male who presents with symptoms of depression citing lack of motivation, low mood, anhedonia, negative self affect. Pt was oriented times 5. Pt was cooperative and engaged. Pt denies SI/HI/AVH.   Assessed for patients progress by  reviewing his treatment plan. Patient reports he feels he has been doing well and is proud of his progress.  See progress notes listed above.  Reminisced on positive memories and acknowledged "I've had a wonderful life." Identifies he has made efforts to shift his perspective to be positive. Shares he doesn't feel he has much  more to process in therapy.   Readministered GAD7 and PHQ9 screeners. Anxiety screener scores decreased from 11 to 0 and depression scores decreased from 21 to 0.   Discussed termination from services due to progress towards goals and screener scores.  Clinician and patient briefly met with patient's wife to discuss termination from services.  Patient reflected on his feelings regarding accomplishments towards goals and discussed patient's final session.  Resources were shared with patient and his spouse should they want to resume services.  Clinician also reaffirmed patient can resume services with [facility].   Patient's spouse agreed to termination from services citing she feels his mood has overall improved and he has been sleeping better due to medication changes.  She expressed concerns patient is not utilizing adaptive tools provided by [employer] to assist with hearing and vision ailments.  Processed this with the patient and addressed ways in which he can grow comfortable with these devices.  Also processed ways in which patient can become more comfortable attending routine church services again.  Suicidal/Homicidal: Nowithout intent/plan  Therapist Response: Cln utilized active and supportive reflection to create an environment for the patient to process recent life stressors and symptoms. The clinician assessed for stressors, symptoms, and safety since the last session.  Reviewed patient's progress and readministered screeners.  Plan: Patient was terminated from services due to successful completion of therapeutic goals.  Diagnosis: MDD (major depressive  disorder), recurrent, in partial remission (HCC)  Bereavement  Neurocognitive disorder     Collaboration of Care: [facility] psychiatrist can access notes and cln. Will review psychiatrists' notes. Check in with the patient and will see LCSW per availability. Patient agreed with treatment recommendations.   Patient/Guardian was advised Release of Information must be obtained prior to any record release in order to collaborate their care with an outside provider. Patient/Guardian was advised if they have not already done so to contact the registration department to sign all necessary forms in order for [place] to release information regarding their care.   Consent: Patient/Guardian gives verbal consent for treatment and assignment of benefits for services provided during this visit. Patient/Guardian expressed understanding and agreed to proceed.   Chase Leep, LCSW 05/15/2023

## 2023-05-23 ENCOUNTER — Other Ambulatory Visit: Payer: Self-pay | Admitting: Psychiatry

## 2023-05-23 DIAGNOSIS — F411 Generalized anxiety disorder: Secondary | ICD-10-CM

## 2023-05-23 DIAGNOSIS — F3341 Major depressive disorder, recurrent, in partial remission: Secondary | ICD-10-CM

## 2023-05-30 ENCOUNTER — Encounter: Payer: PRIVATE HEALTH INSURANCE | Admitting: Pain Medicine

## 2023-05-30 ENCOUNTER — Ambulatory Visit: Payer: PRIVATE HEALTH INSURANCE | Attending: Nurse Practitioner | Admitting: Nurse Practitioner

## 2023-05-30 ENCOUNTER — Encounter: Payer: Self-pay | Admitting: Nurse Practitioner

## 2023-05-30 DIAGNOSIS — M5417 Radiculopathy, lumbosacral region: Secondary | ICD-10-CM | POA: Diagnosis not present

## 2023-05-30 DIAGNOSIS — M961 Postlaminectomy syndrome, not elsewhere classified: Secondary | ICD-10-CM | POA: Diagnosis not present

## 2023-05-30 DIAGNOSIS — M5442 Lumbago with sciatica, left side: Secondary | ICD-10-CM | POA: Diagnosis not present

## 2023-05-30 DIAGNOSIS — M79605 Pain in left leg: Secondary | ICD-10-CM | POA: Diagnosis not present

## 2023-05-30 DIAGNOSIS — G894 Chronic pain syndrome: Secondary | ICD-10-CM | POA: Diagnosis not present

## 2023-05-30 DIAGNOSIS — Z79899 Other long term (current) drug therapy: Secondary | ICD-10-CM

## 2023-05-30 DIAGNOSIS — C44319 Basal cell carcinoma of skin of other parts of face: Secondary | ICD-10-CM | POA: Diagnosis not present

## 2023-05-30 DIAGNOSIS — Z79891 Long term (current) use of opiate analgesic: Secondary | ICD-10-CM

## 2023-05-30 DIAGNOSIS — G8929 Other chronic pain: Secondary | ICD-10-CM

## 2023-05-30 MED ORDER — OXYCODONE HCL 10 MG PO TABS: 10.0000 mg | ORAL_TABLET | Freq: Four times a day (QID) | ORAL | 0 refills | Status: DC | PRN

## 2023-05-30 NOTE — Progress Notes (Signed)
 Nursing Pain Medication Assessment:  Safety precautions to be maintained throughout the outpatient stay will include: orient to surroundings, keep bed in low position, maintain call bell within reach at all times, provide assistance with transfer out of bed and ambulation.  Medication Inspection Compliance: Pill count conducted under aseptic conditions, in front of the patient. Neither the pills nor the bottle was removed from the patient's sight at any time. Once count was completed pills were immediately returned to the patient in their original bottle.  Medication: Oxycodone  IR Pill/Patch Count:  35 of 120 pills remain Pill/Patch Appearance: Markings consistent with prescribed medication Bottle Appearance: Standard pharmacy container. Clearly labeled. Filled Date: 03 / 31 / 2025 Last Medication intake:  Yesterday

## 2023-05-30 NOTE — Progress Notes (Signed)
 PROVIDER NOTE: Interpretation of information contained herein should be left to medically-trained personnel. Specific patient instructions are provided elsewhere under "Patient Instructions" section of medical record. This document was created in part using AI and STT-dictation technology, any transcriptional errors that may result from this process are unintentional.  Patient: Chase Mendez  Service: E/M   PCP: Shari Daughters, MD  DOB: 1934-07-24  DOS: 05/30/2023  Provider: Cherylin Corrigan, NP  MRN: [id number]  Delivery: Face-to-face  Specialty: Interventional Pain Management  Type: Established Patient  Setting: Ambulatory outpatient facility  Specialty designation: 09  Referring Prov.: Shari Daughters, MD  Location: Outpatient office facility       HPI  [name], a 89 y.o. year old male, is here today because of his No primary diagnosis found.. Mr. Canizalez primary complain today is Hip Pain (left)   Pain Assessment: Severity of Chronic pain is reported as a 5 /10. Location: Hip Left/radiates down left leg. Onset: More than a month ago. Quality: Aching, Throbbing, Sharp, Burning, Pins and needles. Timing: Constant. Modifying factor(s): med. Vitals:  height is 6' (1.829 m) and weight is 195 lb (88.5 kg). His temperature is 97.6 F (36.4 C). His blood pressure is 115/61 and his pulse is 69. His respiration is 16 and oxygen saturation is 92%.  BMI: Estimated body mass index is 26.45 kg/m as calculated from the following:   Height as of this encounter: 6' (1.829 m).   Weight as of this encounter: 195 lb (88.5 kg). Last encounter: 03/05/2023.  Reason for encounter: medication management.  The patient indicates doing well with the current medication regimen.  No side effects or any adverse reaction reported to the medication.   Pharmacotherapy Assessment  Analgesic: Oxycodone  HCl 10 mg tablets every 6 hours as needed for pain. MME=60  Monitoring: [place] PMP: PDMP reviewed during this  encounter.       Pharmacotherapy: No side-effects or adverse reactions reported. Compliance: No problems identified. Effectiveness: Clinically acceptable.  Humberto Magnus, RN  05/30/2023  9:28 AM  Sign when Signing Visit Nursing Pain Medication Assessment:  Safety precautions to be maintained throughout the outpatient stay will include: orient to surroundings, keep bed in low position, maintain call bell within reach at all times, provide assistance with transfer out of bed and ambulation.  Medication Inspection Compliance: Pill count conducted under aseptic conditions, in front of the patient. Neither the pills nor the bottle was removed from the patient's sight at any time. Once count was completed pills were immediately returned to the patient in their original bottle.  Medication: Oxycodone  IR Pill/Patch Count:  35 of 120 pills remain Pill/Patch Appearance: Markings consistent with prescribed medication Bottle Appearance: Standard pharmacy container. Clearly labeled. Filled Date: 03 / 31 / 2025 Last Medication intake:  Yesterday    No results found for: "CBDTHCR" No results found for: "D8THCCBX" No results found for: "D9THCCBX"  UDS:  Summary  Date Value Ref Range Status  09/05/2022 Note  Final    Comment:    ==================================================================== [facility] (MW) ==================================================================== Test                             Result       Flag       Units  Drug Present and Declared for Prescription Verification   Oxycodone   3918         EXPECTED   ng/mg creat   Oxymorphone                    979          EXPECTED   ng/mg creat   Noroxycodone                   7697         EXPECTED   ng/mg creat   Noroxymorphone                 868          EXPECTED   ng/mg creat    Sources of oxycodone  are scheduled prescription medications.    Oxymorphone, noroxycodone, and noroxymorphone are  expected    metabolites of oxycodone . Oxymorphone is also available as a    scheduled prescription medication.  ==================================================================== Test                      Result    Flag   Units      Ref Range   Creatinine              34               mg/dL      >=81 ==================================================================== Declared Medications:  The flagging and interpretation on this report are based on the  following declared medications.  Unexpected results may arise from  inaccuracies in the declared medications.   **Note: The testing scope of this panel includes these medications:   Oxycodone    **Note: The testing scope of this panel does not include the  following reported medications:   Acetaminophen  (Tylenol )  Allopurinol   Amlodipine   Aspirin   Atorvastatin   Diphenhydramine (Benadryl)  Famotidine   Fluticasone  (Flonase )  Gabapentin  (Neurontin )  Isosorbide  (Imdur )  Levothyroxine   Losartan   Mirabegron  (Myrbetriq )  Naloxone  (Narcan )  Ondansetron   Sertraline  (Zoloft )  Tamsulosin  (Flomax )  Testosterone   Vitamin B12  Vitamin D2 (Drisdol)  Vitamin E  ==================================================================== For clinical consultation, please call [phone]. ====================================================================       ROS  Constitutional: Denies any fever or chills Gastrointestinal: No reported hemesis, hematochezia, vomiting, or acute GI distress Musculoskeletal: left hip pain  Neurological: No reported episodes of acute onset apraxia, aphasia, dysarthria, agnosia, amnesia, paralysis, loss of coordination, or loss of consciousness  Medication Review  Cholecalciferol, Oxycodone  HCl, Testosterone , Vitamin D (Ergocalciferol), acetaminophen , allopurinol , amLODipine , aspirin , atorvastatin , cyanocobalamin , diphenhydrAMINE, famotidine , fluticasone , gabapentin , hydrOXYzine , isosorbide   mononitrate, levothyroxine , losartan , mirabegron  ER, naloxone , ondansetron , pantoprazole , sertraline , tamsulosin , and vitamin E   History Review  Allergy: Mr. Matranga is allergic to wellbutrin  [bupropion ], doxycycline, ibuprofen, and sulfa antibiotics. Drug: Mr. Minder  reports current drug use. Drug: Oxycodone . Alcohol:  reports no history of alcohol use. Tobacco:  reports that he has been smoking cigars. He has never used smokeless tobacco. Social: Mr. Waddington  reports that he has been smoking cigars. He has never used smokeless tobacco. He reports current drug use. Drug: Oxycodone . He reports that he does not drink alcohol. Medical:  has a past medical history of Acute encephalopathy (12/08/2014), Anxiety, ARF (acute renal failure) (HCC) (12/08/2014), Back pain, Benign neoplasm of large bowel, Capsulitis, Chronic back pain, Coronavirus infection, Depression, Diabetes mellitus without complication (HCC), Dysphagia, Exostosis, Foot drop, left, Frequent falls (02/2018), GERD (gastroesophageal reflux disease), Gout, Hypertension, Hypothyroidism, Insomnia, Low testosterone , Microscopic hematuria (2016), Myocardial infarction (HCC), Pneumonia (12/07/2014), Pressure ulcer (12/09/2014), Sepsis (HCC) (12/08/2014),  and Thyroid  disease. Surgical: Mr. Stead  has a past surgical history that includes Cholecystectomy; Esophagogastroduodenoscopy (egd) with propofol  (N/A, 04/24/2017); Back surgery (1995, 1996); Eye surgery (Bilateral, 1983, 1985); Appendectomy; and Spinal cord stimulator insertion (N/A, 03/13/2018). Family: family history includes Diabetes in his father; Heart disease in his mother; Kidney cancer in his brother.  Laboratory Chemistry Profile   Renal Lab Results  Component Value Date   BUN 14 03/02/2023   CREATININE 1.15 03/02/2023   BCR 12 03/02/2023   GFRAA >60 01/28/2019   GFRNONAA >60 07/19/2022    Hepatic Lab Results  Component Value Date   AST 16 03/02/2023   ALT 10 03/02/2023   ALBUMIN  3.7 03/02/2023   ALKPHOS 110 03/02/2023   HCVAB <0.1 07/24/2018   LIPASE 37 08/21/2016    Electrolytes Lab Results  Component Value Date   NA 144 03/02/2023   K 5.1 03/02/2023   CL 104 03/02/2023   CALCIUM  8.6 03/02/2023   MG 2.2 07/18/2022   PHOS 2.6 07/19/2022    Bone Lab Results  Component Value Date   TESTOSTERONE  69 (L) 04/27/2023    Inflammation (CRP: Acute Phase) (ESR: Chronic Phase) Lab Results  Component Value Date   CRP 6.6 (H) 07/19/2022   LATICACIDVEN 1.7 07/17/2022         Note: Above Lab results reviewed.  Recent Imaging Review  US  Venous Img Lower Bilateral (DVT) CLINICAL DATA:  16109 Swelling 60454  EXAM: BILATERAL LOWER EXTREMITY VENOUS DOPPLER ULTRASOUND  TECHNIQUE: Gray-scale sonography with graded compression, as well as color Doppler and duplex ultrasound were performed to evaluate the lower extremity deep venous systems from the level of the common femoral vein and including the common femoral, femoral, profunda femoral, popliteal and calf veins including the posterior tibial, peroneal and gastrocnemius veins when visible. The superficial great saphenous vein was also interrogated. Spectral Doppler was utilized to evaluate flow at rest and with distal augmentation maneuvers in the common femoral, femoral and popliteal veins.  COMPARISON:  None Available.  FINDINGS: RIGHT LOWER EXTREMITY  Common Femoral Vein: No evidence of thrombus. Normal compressibility, respiratory phasicity and response to augmentation.  Saphenofemoral Junction: No evidence of thrombus. Normal compressibility and flow on color Doppler imaging.  Profunda Femoral Vein: No evidence of thrombus. Normal compressibility and flow on color Doppler imaging.  Femoral Vein: No evidence of thrombus. Normal compressibility, respiratory phasicity and response to augmentation.  Popliteal Vein: No evidence of thrombus. Normal compressibility, respiratory phasicity and  response to augmentation.  Calf Veins: Calf veins were poorly visualized on grayscale imaging, but were without evidence of thrombus on color Doppler evaluation  Superficial Great Saphenous Vein: No evidence of thrombus. Normal compressibility.  Venous Reflux:  None.  Other Findings:  None.  LEFT LOWER EXTREMITY  Common Femoral Vein: No evidence of thrombus. Normal compressibility, respiratory phasicity and response to augmentation.  Saphenofemoral Junction: No evidence of thrombus. Normal compressibility and flow on color Doppler imaging.  Profunda Femoral Vein: No evidence of thrombus. Normal compressibility and flow on color Doppler imaging.  Femoral Vein: No evidence of thrombus. Normal compressibility, respiratory phasicity and response to augmentation.  Popliteal Vein: No evidence of thrombus. Normal compressibility, respiratory phasicity and response to augmentation.  Calf Veins: Calf veins were poorly visualized on grayscale imaging, but were without evidence of thrombus on color Doppler evaluation  Superficial Great Saphenous Vein: No evidence of thrombus. Normal compressibility.  Venous Reflux:  None.  Other Findings: Soft tissue swelling in the left-greater-than-right lower extremity.  IMPRESSION: No evidence of deep venous thrombosis in either lower extremity.  Electronically Signed   By: Clora Dane M.D.   On: 07/17/2022 15:43 CT Angio Chest PE W/Cm &/Or Wo Cm CLINICAL DATA:  Pulmonary embolism suspected, high probability  EXAM: CT ANGIOGRAPHY CHEST WITH CONTRAST  TECHNIQUE: Multidetector CT imaging of the chest was performed using the standard protocol during bolus administration of intravenous contrast. Multiplanar CT image reconstructions and MIPs were obtained to evaluate the vascular anatomy.  RADIATION DOSE REDUCTION: This exam was performed according to the departmental dose-optimization program which includes automated exposure  control, adjustment of the mA and/or kV according to patient size and/or use of iterative reconstruction technique.  CONTRAST:  50mL OMNIPAQUE  IOHEXOL  350 MG/ML SOLN  COMPARISON:  None Available.  FINDINGS: Cardiovascular: Conventional 3 vessel arch anatomy. Atherosclerotic calcifications throughout the aorta and along the coronary arteries. Mild cardiomegaly. The pulmonary arteries are well opacified to the proximal segmental level. No evidence of acute pulmonary embolus. No pericardial effusion.  Mediastinum/Nodes: Unremarkable CT appearance of the thyroid  gland. No suspicious mediastinal or hilar adenopathy. No soft tissue mediastinal mass. Small hiatal hernia.  Lungs/Pleura: Multifocal ground-glass attenuation airspace opacities and small nodules throughout the right upper lobe in a peribronchovascular distribution. Diffuse right lower lobe bronchial wall thickening with a combination of peribronchovascular patchy airspace opacities and volume loss. Similar but less impressive findings in the left lower lobe. Mild background of centrilobular pulmonary emphysema. No pleural effusion or pneumothorax.  Upper Abdomen: Submucosal edema at the hepatic flexure and proximal ascending colon. Inflammatory stranding is present in the pericolonic fat. The liver demonstrates a cirrhotic morphology with faint nodularity and hypertrophy of the left lobe relative to the right. The gallbladder is surgically absent.  Musculoskeletal: No chest wall abnormality. No acute or significant osseous findings. Epidural spinal stimulator in place.  Review of the MIP images confirms the above findings.  IMPRESSION: 1. CT findings are most consistent with multi lobar pneumonia involving the right upper, right lower and, to a lesser extent, the left lower lobes. 2. Negative for acute pulmonary embolus. 3. Additionally, the visualized hepatic flexure and proximal transverse colon are abnormal with  diffuse wall thickening and pericolonic inflammatory stranding. Findings suggest active infectious/inflammatory colitis. 4. Aortic and coronary artery atherosclerotic vascular calcifications. 5. Mild centrilobular pulmonary emphysema. 6. Small hiatal hernia.  Aortic Atherosclerosis (ICD10-I70.0) and Emphysema (ICD10-J43.9).  Electronically Signed   By: Fernando Hoyer M.D.   On: 07/17/2022 08:19 DG Chest Port 1 View CLINICAL DATA:  Questionable sepsis  EXAM: PORTABLE CHEST 1 VIEW  COMPARISON:  01/27/2019  FINDINGS: Low volume chest with interstitial crowding at the bases. No edema, effusion, or pneumothorax. Normal heart size and mediastinal contours for technique. Spinal cord stimulator the lower thoracic level.  IMPRESSION: Low volume chest with atelectatic type density at the bases. Early bronchopneumonia is not excluded.  Electronically Signed   By: Ronnette Coke M.D.   On: 07/17/2022 05:50 Note: Reviewed        Physical Exam  General appearance: Well nourished, well developed, and well hydrated. In no apparent acute distress Mental status: Alert, oriented x 3 (person, place, & time)       Respiratory: No evidence of acute respiratory distress Eyes: PERLA Vitals: BP 115/61   Pulse 69   Temp 97.6 F (36.4 C)   Resp 16   Ht 6' (1.829 m)   Wt 195 lb (88.5 kg)   SpO2 92%   BMI 26.45 kg/m  BMI: Estimated  body mass index is 26.45 kg/m as calculated from the following:   Height as of this encounter: 6' (1.829 m).   Weight as of this encounter: 195 lb (88.5 kg). Ideal: Ideal body weight: 77.6 kg (171 lb 1.2 oz) Adjusted ideal body weight: 81.9 kg (180 lb 10.3 oz)  Assessment   Diagnosis Status  1. Chronic lower extremity pain (1ry area of Pain) (Left)   2. Chronic low back pain (2ry area of Pain) (Bilateral) (ML) (L>R) w/ sciatica (Left)   3. Lumbosacral radiculopathy at L5 (Left)   4. Failed back surgical syndrome   5. Chronic pain syndrome   6.  Pharmacologic therapy   7. Chronic use of opiate for therapeutic purpose   8. Encounter for medication management   9. Encounter for chronic pain management    Controlled Controlled Controlled   Plan of Care  Assessment and Plan We will continue on current medication regimen.  Prescribing drug monitoring (PDMP) consistent with prescribed medication.  No changes in medical history reported by the patient.   Interventional Therapies  Risk  Complexity Considerations:   WNL    Planned  Pending:        Under consideration:   Therapeutic bilateral cervical facet MB RFA #1     Completed:   Diagnostic/therapeutic bilateral cervical facet MBB x2 (03/16/2022) (1st:100/100/100/100) (2nd: 100/100/100/20) Diagnostic/therapeutic left caudal ESI + epidurogram x1 (11/17/2021) (100/100/25/25)     Therapeutic  Palliative (PRN) options:   Therapeutic/palliative left L4 TFESI #2  Therapeutic/palliative left L5 TFESI #2  Bilateral spinal cord stimulator trial (done - 01/17/2018)  Permanent bilateral spinal cord stimulator implant by Dr. Jeris Montes (neurosurgery) (done - 03/13/2018)    Pharmacotherapy  Nonopioids transferred 01/12/2020: Gabapentin         Pharmacotherapy (Medications Ordered): Meds ordered this encounter  Medications   Oxycodone  HCl 10 MG TABS    Sig: Take 1 tablet (10 mg total) by mouth every 6 (six) hours as needed. Must last 30 days.    Dispense:  120 tablet    Refill:  0    DO NOT: delete (not duplicate); no partial-fill (will deny script to complete), no refill request (F/U required). DISPENSE: 1 day early if closed on fill date. WARN: No CNS-depressants within 8 hrs of med.   Oxycodone  HCl 10 MG TABS    Sig: Take 1 tablet (10 mg total) by mouth every 6 (six) hours as needed. Must last 30 days.    Dispense:  120 tablet    Refill:  0    DO NOT: delete (not duplicate); no partial-fill (will deny script to complete), no refill request (F/U required). DISPENSE: 1 day  early if closed on fill date. WARN: No CNS-depressants within 8 hrs of med.   Oxycodone  HCl 10 MG TABS    Sig: Take 1 tablet (10 mg total) by mouth every 6 (six) hours as needed. Must last 30 days.    Dispense:  120 tablet    Refill:  0    DO NOT: delete (not duplicate); no partial-fill (will deny script to complete), no refill request (F/U required). DISPENSE: 1 day early if closed on fill date. WARN: No CNS-depressants within 8 hrs of med.   Orders:  No orders of the defined types were placed in this encounter.  Follow-up plan:   Return in about 3 months (around 08/29/2023) for (F2F), (MM), Marthe Slain NP.        Recent Visits Date Type Provider Dept  03/05/23 Office Visit Barth Borne,  Arnetta Lank, MD [facility]  Showing recent visits within past 90 days and meeting all other requirements Today's Visits Date Type Provider Dept  05/30/23 Office Visit Hannahgrace Lalli K, NP [facility]  Showing today's visits and meeting all other requirements Future Appointments No visits were found meeting these conditions. Showing future appointments within next 90 days and meeting all other requirements  I discussed the assessment and treatment plan with the patient. The patient was provided an opportunity to ask questions and all were answered. The patient agreed with the plan and demonstrated an understanding of the instructions.  Patient advised to call back or seek an in-person evaluation if the symptoms or condition worsens.  Duration of encounter: 30 minutes.  Total time on encounter, as per AMA guidelines included both the face-to-face and non-face-to-face time personally spent by the physician and/or other qualified health care professional(s) on the day of the encounter (includes time in activities that require the physician or other qualified health care professional and does not include time in activities normally performed by clinical staff). Physician's time may include the  following activities when performed: Preparing to see the patient (e.g., pre-charting review of records, searching for previously ordered imaging, lab work, and nerve conduction tests) Review of prior analgesic pharmacotherapies. Reviewing PMP Interpreting ordered tests (e.g., lab work, imaging, nerve conduction tests) Performing post-procedure evaluations, including interpretation of diagnostic procedures Obtaining and/or reviewing separately obtained history Performing a medically appropriate examination and/or evaluation Counseling and educating the patient/family/caregiver Ordering medications, tests, or procedures Referring and communicating with other health care professionals (when not separately reported) Documenting clinical information in the electronic or other health record Independently interpreting results (not separately reported) and communicating results to the patient/ family/caregiver Care coordination (not separately reported)  Note by: Yuleni Burich K Sabiha Sura, NP (TTS and AI technology used. I apologize for any typographical errors that were not detected and corrected.) Date: 05/30/2023; Time: 10:11 AM

## 2023-06-04 ENCOUNTER — Other Ambulatory Visit: Payer: Self-pay | Admitting: Internal Medicine

## 2023-06-04 DIAGNOSIS — E785 Hyperlipidemia, unspecified: Secondary | ICD-10-CM

## 2023-06-06 ENCOUNTER — Other Ambulatory Visit: Payer: Self-pay | Admitting: Internal Medicine

## 2023-06-06 DIAGNOSIS — K2100 Gastro-esophageal reflux disease with esophagitis, without bleeding: Secondary | ICD-10-CM

## 2023-06-06 DIAGNOSIS — C44519 Basal cell carcinoma of skin of other part of trunk: Secondary | ICD-10-CM | POA: Diagnosis not present

## 2023-06-07 ENCOUNTER — Other Ambulatory Visit

## 2023-06-07 DIAGNOSIS — I10 Essential (primary) hypertension: Secondary | ICD-10-CM | POA: Diagnosis not present

## 2023-06-07 DIAGNOSIS — E782 Mixed hyperlipidemia: Secondary | ICD-10-CM

## 2023-06-07 DIAGNOSIS — E039 Hypothyroidism, unspecified: Secondary | ICD-10-CM

## 2023-06-08 LAB — COMPREHENSIVE METABOLIC PANEL WITH GFR
ALT: 13 IU/L (ref 0–44)
AST: 21 IU/L (ref 0–40)
Albumin: 3.9 g/dL (ref 3.7–4.7)
Alkaline Phosphatase: 124 IU/L — ABNORMAL HIGH (ref 44–121)
BUN/Creatinine Ratio: 14 (ref 10–24)
BUN: 17 mg/dL (ref 8–27)
Bilirubin Total: 0.3 mg/dL (ref 0.0–1.2)
CO2: 24 mmol/L (ref 20–29)
Calcium: 8.9 mg/dL (ref 8.6–10.2)
Chloride: 104 mmol/L (ref 96–106)
Creatinine, Ser: 1.25 mg/dL (ref 0.76–1.27)
Globulin, Total: 2.6 g/dL (ref 1.5–4.5)
Glucose: 141 mg/dL — ABNORMAL HIGH (ref 70–99)
Potassium: 4.9 mmol/L (ref 3.5–5.2)
Sodium: 142 mmol/L (ref 134–144)
Total Protein: 6.5 g/dL (ref 6.0–8.5)
eGFR: 56 mL/min/{1.73_m2} — ABNORMAL LOW (ref >59)

## 2023-06-08 LAB — CBC WITH DIFF/PLATELET
Basophils Absolute: 0.1 10*3/uL (ref 0.0–0.2)
Basos: 1 %
EOS (ABSOLUTE): 0.4 10*3/uL (ref 0.0–0.4)
Eos: 7 %
Hematocrit: 37.7 % (ref 37.5–51.0)
Hemoglobin: 11.6 g/dL — ABNORMAL LOW (ref 13.0–17.7)
Immature Grans (Abs): 0 10*3/uL (ref 0.0–0.1)
Immature Granulocytes: 0 %
Lymphocytes Absolute: 2 10*3/uL (ref 0.7–3.1)
Lymphs: 30 %
MCH: 25.1 pg — ABNORMAL LOW (ref 26.6–33.0)
MCHC: 30.8 g/dL — ABNORMAL LOW (ref 31.5–35.7)
MCV: 81 fL (ref 79–97)
Monocytes Absolute: 0.8 10*3/uL (ref 0.1–0.9)
Monocytes: 11 %
Neutrophils Absolute: 3.4 10*3/uL (ref 1.4–7.0)
Neutrophils: 51 %
Platelets: 152 10*3/uL (ref 150–450)
RBC: 4.63 x10E6/uL (ref 4.14–5.80)
RDW: 14.5 % (ref 11.6–15.4)
WBC: 6.7 10*3/uL (ref 3.4–10.8)

## 2023-06-08 LAB — LIPID PANEL
Chol/HDL Ratio: 2.7 ratio (ref 0.0–5.0)
Cholesterol, Total: 92 mg/dL — ABNORMAL LOW (ref 100–199)
HDL: 34 mg/dL — ABNORMAL LOW (ref >39)
LDL Chol Calc (NIH): 37 mg/dL (ref 0–99)
Triglycerides: 111 mg/dL (ref 0–149)
VLDL Cholesterol Cal: 21 mg/dL (ref 5–40)

## 2023-06-08 LAB — TSH: TSH: 0.906 u[IU]/mL (ref 0.450–4.500)

## 2023-06-11 ENCOUNTER — Other Ambulatory Visit: Payer: Self-pay | Admitting: Internal Medicine

## 2023-06-11 ENCOUNTER — Other Ambulatory Visit: Payer: Self-pay

## 2023-06-11 MED ORDER — FAMOTIDINE 20 MG PO TABS
20.0000 mg | ORAL_TABLET | Freq: Two times a day (BID) | ORAL | 1 refills | Status: DC
  Filled 2023-06-11: qty 180, 90d supply, fill #0

## 2023-06-12 ENCOUNTER — Encounter: Payer: Self-pay | Admitting: Internal Medicine

## 2023-06-12 ENCOUNTER — Ambulatory Visit (INDEPENDENT_AMBULATORY_CARE_PROVIDER_SITE_OTHER): Payer: Medicare HMO | Admitting: Internal Medicine

## 2023-06-12 DIAGNOSIS — E782 Mixed hyperlipidemia: Secondary | ICD-10-CM

## 2023-06-12 DIAGNOSIS — E039 Hypothyroidism, unspecified: Secondary | ICD-10-CM | POA: Diagnosis not present

## 2023-06-12 DIAGNOSIS — M1A40X0 Other secondary chronic gout, unspecified site, without tophus (tophi): Secondary | ICD-10-CM

## 2023-06-12 DIAGNOSIS — I10 Essential (primary) hypertension: Secondary | ICD-10-CM | POA: Diagnosis not present

## 2023-06-12 MED ORDER — ALLOPURINOL 100 MG PO TABS: 100.0000 mg | ORAL_TABLET | ORAL | 1 refills | Status: DC

## 2023-06-12 NOTE — Progress Notes (Signed)
 Established Patient Office Visit  Subjective:  Patient ID: Chase Mendez, male    DOB: 30-Mar-1934  Age: 89 y.o. MRN: [id number]  Chief Complaint  Patient presents with   Follow-up    3 month follow up    Last fall on 5/4 when he rolled out of bed, rose unaided without any apparent injury. Labs reviewed and notable for well controlled lipids, slight dip in gfr.    No other concerns at this time.   Past Medical History:  Diagnosis Date   Acute encephalopathy 12/08/2014   Anxiety    ARF (acute renal failure) (HCC) 12/08/2014   Back pain    Benign neoplasm of large bowel    Capsulitis    fractured displaced metatarsal with capsulitis   Chronic back pain    Coronavirus infection    Depression    Diabetes mellitus without complication (HCC)    no medications currently   Dysphagia    Exostosis    painful, right hallux   Foot drop, left    wears a brace   Frequent falls 02/2018   poor balance, foot drop   GERD (gastroesophageal reflux disease)    Gout    Hypertension    Hypothyroidism    Insomnia    Low testosterone     Microscopic hematuria 2016   Myocardial infarction [facility])    patient unaware when it happened years ago.     Pneumonia 12/07/2014   Pressure ulcer 12/09/2014   Sepsis (HCC) 12/08/2014   Thyroid  disease     Past Surgical History:  Procedure Laterality Date   APPENDECTOMY     BACK SURGERY  1995, 1996   x 2   CHOLECYSTECTOMY     ESOPHAGOGASTRODUODENOSCOPY (EGD) WITH PROPOFOL  N/A 04/24/2017   Procedure: ESOPHAGOGASTRODUODENOSCOPY (EGD) WITH PROPOFOL ;  Surgeon: Cassie Click, MD;  Location: [facility] ENDOSCOPY;  Service: Endoscopy;  Laterality: N/A;   EYE SURGERY Bilateral 1983, 1985   cataract extractions   SPINAL CORD STIMULATOR INSERTION N/A 03/13/2018   Procedure: SPINAL CORD STIMULATOR INSERTION;  Surgeon: Jodeen Munch, MD;  Location: [facility] ORS;  Service: Neurosurgery;  Laterality: N/A;    Social History   Socioeconomic History   Marital  status: Married    Spouse name: dorothy   Number of children: 2   Years of education: Not on file   Highest education level: Some college, no degree  Occupational History   Occupation: maintenance / repair    Comment: disabled  Tobacco Use   Smoking status: Every Day    Types: Cigars   Smokeless tobacco: Never   Tobacco comments:    unable to give cessation materials due to webex visit   Vaping Use   Vaping status: Never Used  Substance and Sexual Activity   Alcohol use: No   Drug use: Yes    Types: Oxycodone    Sexual activity: Not Currently  Other Topics Concern   Not on file  Social History Narrative   Not on file   Social Drivers of Health   Financial Resource Strain: Low Risk  (01/22/2023)   Received from [facility]   Overall Financial Resource Strain (CARDIA)    Difficulty of Paying Living Expenses: Not hard at all  Food Insecurity: No Food Insecurity (01/22/2023)   Received from [facility]   Hunger Vital Sign    Worried About Running Out of Food in the Last Year: Never true    Ran Out of Food in the Last  Year: Never true  Transportation Needs: No Transportation Needs (07/17/2022)   PRAPARE - Administrator, Civil Service (Medical): No    Lack of Transportation (Non-Medical): No  Physical Activity: Not on file  Stress: Not on file  Social Connections: Not on file  Intimate Partner Violence: Not At Risk (07/17/2022)   Humiliation, Afraid, Rape, and Kick questionnaire    Fear of Current or Ex-Partner: No    Emotionally Abused: No    Physically Abused: No    Sexually Abused: No    Family History  Problem Relation Age of Onset   Heart disease Mother    Diabetes Father    Kidney cancer Brother     Allergies  Allergen Reactions   Wellbutrin  [Bupropion ]     hallucinations   Doxycycline Nausea Only   Ibuprofen Nausea Only   Sulfa Antibiotics Nausea Only    Outpatient Medications Prior to Visit  Medication  Sig   acetaminophen  (TYLENOL ) 500 MG tablet Take 500-1,000 mg by mouth 2 (two) times daily as needed for mild pain.   amLODipine  (NORVASC ) 5 MG tablet Take 2.5 mg by mouth daily.   aspirin  81 MG tablet Take 81 mg by mouth daily.   atorvastatin  (LIPITOR) 10 MG tablet TAKE 1 TABLET BY MOUTH EVERYDAY AT BEDTIME   Cholecalciferol 50 MCG (2000 UT) TABS Take by mouth.   famotidine  (PEPCID ) 20 MG tablet Take 1 tablet (20 mg total) by mouth 2 (two) times daily.   fluticasone  (FLONASE ) 50 MCG/ACT nasal spray Place 1-2 sprays into both nostrils daily as needed for allergies or rhinitis.   gabapentin  (NEURONTIN ) 600 MG tablet TAKE 1 TABLET BY MOUTH TWICE DAILY.   hydrOXYzine  (VISTARIL ) 25 MG capsule TAKE 1 CAPSULE BY MOUTH ONCE DAILY AS NEEDED FOR ANXIETY. DOSE CHANGE STOP 10MG .   isosorbide  mononitrate (IMDUR ) 30 MG 24 hr tablet TAKE 1 TABLET BY MOUTH EVERY DAY   levothyroxine  (SYNTHROID ) 150 MCG tablet Take 1 tablet (150 mcg total) by mouth daily.   losartan  (COZAAR ) 100 MG tablet TAKE 1 TABLET BY MOUTH EVERY DAY   mirabegron  ER (MYRBETRIQ ) 50 MG TB24 tablet Take 1 tablet (50 mg total) by mouth daily. (Patient taking differently: Take 25 mg by mouth daily.)   naloxone  (NARCAN ) nasal spray 4 mg/0.1 mL Place 1 spray into the nose as needed for up to 365 doses (for opioid-induced respiratory depresssion). In case of emergency (overdose), spray once into each nostril. If no response within 3 minutes, repeat application and call 911.   ondansetron  (ZOFRAN -ODT) 4 MG disintegrating tablet TAKE 1 TABLET (4 MG TOTAL) BY MOUTH IN THE MORNING, AT NOON, IN THE EVENING, AND AT BEDTIME.   Oxycodone  HCl 10 MG TABS Take 1 tablet (10 mg total) by mouth every 6 (six) hours as needed. Must last 30 days.   [START ON 07/06/2023] Oxycodone  HCl 10 MG TABS Take 1 tablet (10 mg total) by mouth every 6 (six) hours as needed. Must last 30 days.   [START ON 08/05/2023] Oxycodone  HCl 10 MG TABS Take 1 tablet (10 mg total) by mouth every  6 (six) hours as needed. Must last 30 days.   sertraline  (ZOLOFT ) 50 MG tablet Take 0.5 tablets (25 mg total) by mouth 2 (two) times daily. (Patient taking differently: Take 25 mg by mouth 2 (two) times daily. 12.5mg  in am and 12.5mg  in pm)   tamsulosin  (FLOMAX ) 0.4 MG CAPS capsule Take 0.4 mg by mouth daily.   Testosterone  10 MG/ACT (2%) GEL  Place 10 mg onto the skin daily.   vitamin B-12 (CYANOCOBALAMIN ) 1000 MCG tablet Take 1,000 mcg by mouth daily.   Vitamin D, Ergocalciferol, (DRISDOL) 1.25 MG (50000 UNIT) CAPS capsule TAKE 1 CAPSULE BY MOUTH ONE TIME PER WEEK   vitamin E  400 UNIT capsule Take 400 Units by mouth daily.   [DISCONTINUED] allopurinol  (ZYLOPRIM ) 100 MG tablet TAKE 1 TABLET BY MOUTH EVERY OTHER DAY   [DISCONTINUED] pantoprazole  (PROTONIX ) 20 MG tablet TAKE 1 TABLET BY MOUTH EVERY DAY   [DISCONTINUED] amLODipine  (NORVASC ) 10 MG tablet TAKE 1 TABLET BY MOUTH EVERY DAY (Patient not taking: Reported on 06/12/2023)   No facility-administered medications prior to visit.    Review of Systems  Constitutional: Negative.   HENT: Negative.    Eyes: Negative.   Cardiovascular: Negative.   Gastrointestinal:  Positive for nausea.  Genitourinary: Negative.   Skin: Negative.   Neurological: Negative.   Endo/Heme/Allergies: Negative.   Psychiatric/Behavioral:  Positive for depression. The patient is nervous/anxious.        Objective:   BP 120/60   Pulse 70   Temp (!) 97.1 F (36.2 C)   Ht 6' (1.829 m)   Wt 198 lb 9.6 oz (90.1 kg)   SpO2 94%   BMI 26.94 kg/m   Vitals:   06/12/23 1131  BP: 120/60  Pulse: 70  Temp: (!) 97.1 F (36.2 C)  Height: 6' (1.829 m)  Weight: 198 lb 9.6 oz (90.1 kg)  SpO2: 94%  BMI (Calculated): 26.93    Physical Exam Vitals reviewed.  Constitutional:      Appearance: Normal appearance.  HENT:     Head: Normocephalic.     Left Ear: There is no impacted cerumen.     Nose: Nose normal.     Mouth/Throat:     Mouth: Mucous membranes are  dry.     Pharynx: No posterior oropharyngeal erythema.  Eyes:     Extraocular Movements: Extraocular movements intact.     Pupils: Pupils are equal, round, and reactive to light.  Cardiovascular:     Rate and Rhythm: Regular rhythm.     Chest Wall: PMI is not displaced.     Pulses: Normal pulses.     Heart sounds: Normal heart sounds. No murmur heard. Pulmonary:     Effort: Pulmonary effort is normal.     Breath sounds: Normal air entry. No rhonchi or rales.  Abdominal:     General: Abdomen is flat. Bowel sounds are normal. There is no distension.     Palpations: Abdomen is soft. There is no hepatomegaly, splenomegaly or mass.     Tenderness: There is no abdominal tenderness.  Musculoskeletal:        General: Normal range of motion.     Cervical back: Normal range of motion and neck supple.     Right lower leg: No edema.     Left lower leg: No edema.  Skin:    General: Skin is warm and dry.     Findings: Signs of injury (right ankle pressure injury) present.     Comments: Decreased skin turgor  Neurological:     General: No focal deficit present.     Mental Status: He is alert and oriented to person, place, and time.     Cranial Nerves: No cranial nerve deficit.     Motor: No weakness.  Psychiatric:        Mood and Affect: Mood normal.        Behavior: Behavior normal.  No results found for any visits on 06/12/23.  Recent Results (from the past 2160 hours)  Testosterone      Status: Abnormal   Collection Time: 04/27/23  9:17 AM  Result Value Ref Range   Testosterone  69 (L) 264 - 916 ng/dL    Comment: Adult male reference interval is based on a population of healthy nonobese males (BMI <30) between 15 and 75 years old. Travison, et.al. JCEM [phone]. PMID: [id number].   CBC With Diff/Platelet     Status: Abnormal   Collection Time: 06/07/23  9:35 AM  Result Value Ref Range   WBC 6.7 3.4 - 10.8 x10E3/uL   RBC 4.63 4.14 - 5.80 x10E6/uL   Hemoglobin 11.6  (L) 13.0 - 17.7 g/dL   Hematocrit 29.5 28.4 - 51.0 %   MCV 81 79 - 97 fL   MCH 25.1 (L) 26.6 - 33.0 pg   MCHC 30.8 (L) 31.5 - 35.7 g/dL   RDW 13.2 44.0 - 10.2 %   Platelets 152 150 - 450 x10E3/uL   Neutrophils 51 Not Estab. %   Lymphs 30 Not Estab. %   Monocytes 11 Not Estab. %   Eos 7 Not Estab. %   Basos 1 Not Estab. %   Neutrophils Absolute 3.4 1.4 - 7.0 x10E3/uL   Lymphocytes Absolute 2.0 0.7 - 3.1 x10E3/uL   Monocytes Absolute 0.8 0.1 - 0.9 x10E3/uL   EOS (ABSOLUTE) 0.4 0.0 - 0.4 x10E3/uL   Basophils Absolute 0.1 0.0 - 0.2 x10E3/uL   Immature Granulocytes 0 Not Estab. %   Immature Grans (Abs) 0.0 0.0 - 0.1 x10E3/uL  TSH     Status: None   Collection Time: 06/07/23  9:35 AM  Result Value Ref Range   TSH 0.906 0.450 - 4.500 uIU/mL  Lipid panel     Status: Abnormal   Collection Time: 06/07/23  9:35 AM  Result Value Ref Range   Cholesterol, Total 92 (L) 100 - 199 mg/dL   Triglycerides 725 0 - 149 mg/dL   HDL 34 (L) >36 mg/dL   VLDL Cholesterol Cal 21 5 - 40 mg/dL   LDL Chol Calc (NIH) 37 0 - 99 mg/dL   Chol/HDL Ratio 2.7 0.0 - 5.0 ratio    Comment:                                   T. Chol/HDL Ratio                                             Men  Women                               1/2 Avg.Risk  3.4    3.3                                   Avg.Risk  5.0    4.4                                2X Avg.Risk  9.6    7.1  3X Avg.Risk 23.4   11.0   Comprehensive metabolic panel     Status: Abnormal   Collection Time: 06/07/23  9:35 AM  Result Value Ref Range   Glucose 141 (H) 70 - 99 mg/dL   BUN 17 8 - 27 mg/dL   Creatinine, Ser 4.09 0.76 - 1.27 mg/dL   eGFR 56 (L) >81 XB/JYN/8.29   BUN/Creatinine Ratio 14 10 - 24   Sodium 142 134 - 144 mmol/L   Potassium 4.9 3.5 - 5.2 mmol/L   Chloride 104 96 - 106 mmol/L   CO2 24 20 - 29 mmol/L   Calcium  8.9 8.6 - 10.2 mg/dL   Total Protein 6.5 6.0 - 8.5 g/dL   Albumin 3.9 3.7 - 4.7 g/dL   Globulin,  Total 2.6 1.5 - 4.5 g/dL   Bilirubin Total 0.3 0.0 - 1.2 mg/dL   Alkaline Phosphatase 124 (H) 44 - 121 IU/L   AST 21 0 - 40 IU/L   ALT 13 0 - 44 IU/L      Assessment & Plan:  As per problem list. Problem List Items Addressed This Visit       Endocrine   Hypothyroidism     Other   Mixed hyperlipidemia   Chronic gout without tophus   Relevant Medications   allopurinol  (ZYLOPRIM ) 100 MG tablet   Other Visit Diagnoses       Primary hypertension           Return in about 3 months (around 09/12/2023) for fu with labs prior.   Total time spent: 20 minutes  Arzella Bitters, MD  06/12/2023   This document may have been prepared by [facility] [employer] Voice Recognition software and as such may include unintentional dictation errors.

## 2023-06-14 ENCOUNTER — Other Ambulatory Visit: Payer: Self-pay | Admitting: Psychiatry

## 2023-06-14 DIAGNOSIS — F419 Anxiety disorder, unspecified: Secondary | ICD-10-CM

## 2023-06-14 DIAGNOSIS — F331 Major depressive disorder, recurrent, moderate: Secondary | ICD-10-CM

## 2023-07-13 ENCOUNTER — Ambulatory Visit (INDEPENDENT_AMBULATORY_CARE_PROVIDER_SITE_OTHER): Payer: Self-pay | Admitting: Urology

## 2023-07-13 ENCOUNTER — Encounter: Payer: Self-pay | Admitting: Urology

## 2023-07-13 VITALS — BP 79/44 | HR 70 | Ht 72.0 in | Wt 198.0 lb

## 2023-07-13 DIAGNOSIS — E291 Testicular hypofunction: Secondary | ICD-10-CM | POA: Diagnosis not present

## 2023-07-13 DIAGNOSIS — N3281 Overactive bladder: Secondary | ICD-10-CM

## 2023-07-13 MED ORDER — MIRABEGRON ER 50 MG PO TB24: 50.0000 mg | ORAL_TABLET | Freq: Every day | ORAL | 11 refills | Status: DC

## 2023-07-13 NOTE — Addendum Note (Signed)
 Addended by: Tanya Fantasia on: 07/13/2023 10:59 AM   Modules accepted: Orders

## 2023-07-13 NOTE — Addendum Note (Signed)
 Addended by: Tanya Fantasia on: 07/13/2023 10:56 AM   Modules accepted: Orders

## 2023-07-13 NOTE — Progress Notes (Signed)
 07/13/2023 9:36 AM   Chase Mendez Mar 09, 1934 [id number]  Referring provider: Shari Daughters, MD [address] [place],  [place] [address]  Chief Complaint  Patient presents with   Hypogonadism    HPI: Chase Mendez is a 89 y.o. male presents for annual follow-up.  His wife was with him today  Previously followed by Dr. Fredrick Jenkins for BPH, ED, and overactive bladder. Was placed on testosterone  for significantly low T-levels for cardiac protection; using testosterone  cream sporadically Remains on Myrbetriq  and stable lower urinary tract symptoms.  He gets up once per night to void and denies urinary incontinence He ran out of tamsulosin  which he has been off of for several months and has not seen any worsening of his lower urinary tract symptoms Noted to have hypotension today but denies dizziness/orthostatic symptoms.  His wife thinks he is dehydrated as she has difficulty getting him to drink water Labs 04/27/2023: Testosterone  69; H/H 06/2023 11.6/37.7   PMH: Past Medical History:  Diagnosis Date   Acute encephalopathy 12/08/2014   Anxiety    ARF (acute renal failure) (HCC) 12/08/2014   Back pain    Benign neoplasm of large bowel    Capsulitis    fractured displaced metatarsal with capsulitis   Chronic back pain    Coronavirus infection    Depression    Diabetes mellitus without complication (HCC)    no medications currently   Dysphagia    Exostosis    painful, right hallux   Foot drop, left    wears a brace   Frequent falls 02/2018   poor balance, foot drop   GERD (gastroesophageal reflux disease)    Gout    Hypertension    Hypothyroidism    Insomnia    Low testosterone     Microscopic hematuria 2016   Myocardial infarction [facility])    patient unaware when it happened years ago.     Pneumonia 12/07/2014   Pressure ulcer 12/09/2014   Sepsis (HCC) 12/08/2014   Thyroid  disease     Surgical History: Past Surgical History:  Procedure Laterality Date    APPENDECTOMY     BACK SURGERY  1995, 1996   x 2   CHOLECYSTECTOMY     ESOPHAGOGASTRODUODENOSCOPY (EGD) WITH PROPOFOL  N/A 04/24/2017   Procedure: ESOPHAGOGASTRODUODENOSCOPY (EGD) WITH PROPOFOL ;  Surgeon: Cassie Click, MD;  Location: [facility] ENDOSCOPY;  Service: Endoscopy;  Laterality: N/A;   EYE SURGERY Bilateral 1983, 1985   cataract extractions   SPINAL CORD STIMULATOR INSERTION N/A 03/13/2018   Procedure: SPINAL CORD STIMULATOR INSERTION;  Surgeon: Jodeen Munch, MD;  Location: [facility] ORS;  Service: Neurosurgery;  Laterality: N/A;    Home Medications:  Allergies as of 07/13/2023       Reactions   Wellbutrin  [bupropion ]    hallucinations   Doxycycline Nausea Only   Ibuprofen Nausea Only   Sulfa Antibiotics Nausea Only        Medication List        Accurate as of July 13, 2023  9:36 AM. If you have any questions, ask your nurse or doctor.          acetaminophen  500 MG tablet Commonly known as: TYLENOL  Take 500-1,000 mg by mouth 2 (two) times daily as needed for mild pain.   allopurinol  100 MG tablet Commonly known as: ZYLOPRIM  Take 1 tablet (100 mg total) by mouth every other day.   amLODipine  5 MG tablet Commonly known as: NORVASC  Take 2.5 mg by mouth daily.  aspirin  81 MG tablet Take 81 mg by mouth daily.   atorvastatin  10 MG tablet Commonly known as: LIPITOR TAKE 1 TABLET BY MOUTH EVERYDAY AT BEDTIME   Cholecalciferol 50 MCG (2000 UT) Tabs Take by mouth.   cyanocobalamin  1000 MCG tablet Commonly known as: VITAMIN B12 Take 1,000 mcg by mouth daily.   famotidine  20 MG tablet Commonly known as: PEPCID  Take 1 tablet (20 mg total) by mouth 2 (two) times daily.   fluticasone  50 MCG/ACT nasal spray Commonly known as: FLONASE  Place 1-2 sprays into both nostrils daily as needed for allergies or rhinitis.   gabapentin  600 MG tablet Commonly known as: NEURONTIN  TAKE 1 TABLET BY MOUTH TWICE DAILY.   hydrOXYzine  25 MG capsule Commonly known as:  VISTARIL  TAKE 1 CAPSULE BY MOUTH ONCE DAILY AS NEEDED FOR ANXIETY. DOSE CHANGE STOP 10MG .   isosorbide  mononitrate 30 MG 24 hr tablet Commonly known as: IMDUR  TAKE 1 TABLET BY MOUTH EVERY DAY   levothyroxine  150 MCG tablet Commonly known as: Synthroid  Take 1 tablet (150 mcg total) by mouth daily.   losartan  100 MG tablet Commonly known as: COZAAR  TAKE 1 TABLET BY MOUTH EVERY DAY   mirabegron  ER 50 MG Tb24 tablet Commonly known as: MYRBETRIQ  Take 1 tablet (50 mg total) by mouth daily. What changed: how much to take   naloxone  4 MG/0.1ML Liqd nasal spray kit Commonly known as: NARCAN  Place 1 spray into the nose as needed for up to 365 doses (for opioid-induced respiratory depresssion). In case of emergency (overdose), spray once into each nostril. If no response within 3 minutes, repeat application and call 911.   ondansetron  4 MG disintegrating tablet Commonly known as: ZOFRAN -ODT TAKE 1 TABLET (4 MG TOTAL) BY MOUTH IN THE MORNING, AT NOON, IN THE EVENING, AND AT BEDTIME.   Oxycodone  HCl 10 MG Tabs Take 1 tablet (10 mg total) by mouth every 6 (six) hours as needed. Must last 30 days.   Oxycodone  HCl 10 MG Tabs Take 1 tablet (10 mg total) by mouth every 6 (six) hours as needed. Must last 30 days.   Oxycodone  HCl 10 MG Tabs Take 1 tablet (10 mg total) by mouth every 6 (six) hours as needed. Must last 30 days. Start taking on: August 05, 2023   sertraline  50 MG tablet Commonly known as: ZOLOFT  TAKE 1/2 TABLET BY MOUTH TWO TIMES DAILY   tamsulosin  0.4 MG Caps capsule Commonly known as: FLOMAX  Take 0.4 mg by mouth daily.   Testosterone  10 MG/ACT (2%) Gel Place 10 mg onto the skin daily.   Vitamin D (Ergocalciferol) 1.25 MG (50000 UNIT) Caps capsule Commonly known as: DRISDOL TAKE 1 CAPSULE BY MOUTH ONE TIME PER WEEK   vitamin E  180 MG (400 UNITS) capsule Take 400 Units by mouth daily.        Allergies:  Allergies  Allergen Reactions   Wellbutrin  [Bupropion ]      hallucinations   Doxycycline Nausea Only   Ibuprofen Nausea Only   Sulfa Antibiotics Nausea Only    Family History: Family History  Problem Relation Age of Onset   Heart disease Mother    Diabetes Father    Kidney cancer Brother     Social History:  reports that he has been smoking cigars. He has never used smokeless tobacco. He reports current drug use. Drug: Oxycodone . He reports that he does not drink alcohol.   Physical Exam: BP (!) 79/44   Pulse 70   Ht 6' (1.829 m)  Wt 198 lb (89.8 kg)   BMI 26.85 kg/m   Constitutional:  Alert, No acute distress. HEENT: [place] AT Respiratory: Normal respiratory effort, no increased work of breathing Psychiatric: Normal mood and affect.   Assessment & Plan:    1. BPH with LUTS Has been off tamsulosin  with last refill a 90-day supply June 2024.  No worsening of his lower urinary tract symptoms off this medication and will restart Continue Myrbetriq  50 mg.  2. Hypogonadism Applying sporadically with recent testosterone  level 69 Since his last blood draw he states he has been applying twice a week Lab visit 86-month testosterone , H/H Continue annual office visit  3.  Hypotension Asymptomatic They have a blood pressure monitor at home and his wife will get him to increase his fluid intake Contact PCP for persistent hypotension; to ED for development of symptoms   Geraline Knapp, MD  [facility] [address] [place], [place] [address] [phone]

## 2023-07-15 ENCOUNTER — Other Ambulatory Visit: Payer: Self-pay | Admitting: Cardiovascular Disease

## 2023-07-15 DIAGNOSIS — R0789 Other chest pain: Secondary | ICD-10-CM

## 2023-07-17 ENCOUNTER — Other Ambulatory Visit: Payer: Self-pay | Admitting: Urology

## 2023-07-25 ENCOUNTER — Ambulatory Visit: Admitting: Psychiatry

## 2023-08-02 ENCOUNTER — Other Ambulatory Visit: Payer: Self-pay | Admitting: Internal Medicine

## 2023-08-06 ENCOUNTER — Telehealth: Payer: Self-pay

## 2023-08-06 ENCOUNTER — Telehealth: Payer: Self-pay | Admitting: Urology

## 2023-08-06 NOTE — Telephone Encounter (Signed)
 Prior Auth started via [facility]  (Key: [id number])

## 2023-08-06 NOTE — Telephone Encounter (Signed)
 Date of service 07/13/23 - Patient Wife Calls and  states the claim was submitted to the [facility] account and was denied. Patient reports it should have been billed under their [facility] Advantage plan.

## 2023-08-07 ENCOUNTER — Other Ambulatory Visit: Payer: Self-pay

## 2023-08-07 ENCOUNTER — Telehealth: Payer: Self-pay

## 2023-08-07 NOTE — Telephone Encounter (Signed)
 NIKOLOS BILLIG (Key: [id number]) PA Case ID #: [id number] Rx #: [id number]   Approved on June 30 by [employer] 2017 Your PA request has been approved. Additional information will be provided in the approval communication. (Message 1145) Effective Date: 08/06/2023 Authorization Expiration Date: 02/05/2024 Drug oxyCODONE  HCl 10MG  tablets

## 2023-08-07 NOTE — Telephone Encounter (Signed)
 Chase Mendez (Key: [id number]) PA Case ID #: [id number] Rx #: [id number]  Approved on June 30 by [employer] N[employer] 2017 Your PA request has been approved. Additional information will be provided in the approval communication. (Message 1145) Effective Date: 08/06/2023 Authorization Expiration Date: 02/05/2024 Drug oxyCODONE  HCl 10MG  tablets

## 2023-08-07 NOTE — Telephone Encounter (Signed)
 Pt wife aware Medicare pd all but 30.00.   EOB states it his pts co payment.    Will send to [facility] plan. Advised it may take 45 days to process.

## 2023-08-07 NOTE — Telephone Encounter (Signed)
 The patients wife called and said for the visits 03/01/23 and 05/30/23 his insurance fund is not paying for the visits because of the way it was coded. Evidently it was coded differently than his previous med refill appointments. Is there any way you can look into it for her? She said she calls the billing office every time and they never fix it.

## 2023-08-09 NOTE — Telephone Encounter (Signed)
 Spoke to Chase Mendez regarding billing codes. From what I see billed codes are the same from last 3 visits. Asked her to get more information from insurance company regarding the denial.

## 2023-08-13 DIAGNOSIS — H8112 Benign paroxysmal vertigo, left ear: Secondary | ICD-10-CM | POA: Diagnosis not present

## 2023-08-13 DIAGNOSIS — H6122 Impacted cerumen, left ear: Secondary | ICD-10-CM | POA: Diagnosis not present

## 2023-08-16 ENCOUNTER — Encounter: Payer: Self-pay | Admitting: Psychiatry

## 2023-08-16 ENCOUNTER — Ambulatory Visit (INDEPENDENT_AMBULATORY_CARE_PROVIDER_SITE_OTHER): Admitting: Psychiatry

## 2023-08-16 ENCOUNTER — Telehealth: Payer: Self-pay | Admitting: Psychiatry

## 2023-08-16 VITALS — BP 138/64 | HR 88 | Temp 98.4°F | Ht 72.0 in | Wt 193.0 lb

## 2023-08-16 DIAGNOSIS — F411 Generalized anxiety disorder: Secondary | ICD-10-CM | POA: Diagnosis not present

## 2023-08-16 DIAGNOSIS — F3342 Major depressive disorder, recurrent, in full remission: Secondary | ICD-10-CM

## 2023-08-16 DIAGNOSIS — R419 Unspecified symptoms and signs involving cognitive functions and awareness: Secondary | ICD-10-CM | POA: Diagnosis not present

## 2023-08-16 MED ORDER — BUSPIRONE HCL 5 MG PO TABS: 5.0000 mg | ORAL_TABLET | Freq: Two times a day (BID) | ORAL | 1 refills | Status: DC

## 2023-08-16 MED ORDER — HYDROXYZINE PAMOATE 25 MG PO CAPS: 25.0000 mg | ORAL_CAPSULE | Freq: Every day | ORAL | 5 refills | Status: DC | PRN

## 2023-08-16 NOTE — Telephone Encounter (Signed)
 Hydroxyzine  was already sent during the session.

## 2023-08-16 NOTE — Telephone Encounter (Signed)
 Patient just seen for appointment. He forgot to mention that he needs a refill on the hydroxyzine . Please refill

## 2023-08-16 NOTE — Patient Instructions (Addendum)
 Please start taking Buspar ( Buspirone ) 5 mg twice daily .   Buspirone  Tablets What is this medication? BUSPIRONE  (byoo SPYE rone) treats anxiety. It works by balancing the levels of dopamine and serotonin in your brain, substances that help regulate mood. This medicine may be used for other purposes; ask your health care provider or pharmacist if you have questions. COMMON BRAND NAME(S): BuSpar , Buspar  Dividose What should I tell my care team before I take this medication? They need to know if you have any of these conditions: Kidney or liver disease An unusual or allergic reaction to buspirone , other medications, foods, dyes, or preservatives Pregnant or trying to get pregnant Breast-feeding How should I use this medication? Take this medication by mouth with a glass of water. Follow the directions on the prescription label. You may take this medication with or without food. To ensure that this medication always works the same way for you, you should take it either always with or always without food. Take your doses at regular intervals. Do not take your medication more often than directed. Do not stop taking except on the advice of your care team. Talk to your care team about the use of this medication in children. Special care may be needed. Overdosage: If you think you have taken too much of this medicine contact a poison control center or emergency room at once. NOTE: This medicine is only for you. Do not share this medicine with others. What if I miss a dose? If you miss a dose, take it as soon as you can. If it is almost time for your next dose, take only that dose. Do not take double or extra doses. What may interact with this medication? Do not take this medication with any of the following: Linezolid  MAOIs like Carbex, Eldepryl, Marplan, Nardil, and Parnate Methylene blue Procarbazine This medication may also interact with the  following: Diazepam  Digoxin Diltiazem Erythromycin Grapefruit juice Haloperidol Medications for mental depression or mood problems Medications for seizures like carbamazepine, phenobarbital and phenytoin Nefazodone Other medications for anxiety Rifampin Ritonavir Some antifungal medications like itraconazole, ketoconazole, and voriconazole Verapamil Warfarin This list may not describe all possible interactions. Give your health care provider a list of all the medicines, herbs, non-prescription drugs, or dietary supplements you use. Also tell them if you smoke, drink alcohol, or use illegal drugs. Some items may interact with your medicine. What should I watch for while using this medication? Visit your care team for regular checks on your progress. It may take 1 to 2 weeks before your anxiety gets better. This medication may affect your coordination, reaction time, or judgment. Do not drive or operate machinery until you know how this medication affects you. Sit up or stand slowly to reduce the risk of dizzy or fainting spells. Drinking alcohol with this medication can increase the risk of these side effects. What side effects may I notice from receiving this medication? Side effects that you should report to your care team as soon as possible: Allergic reactions--skin rash, itching, hives, swelling of the face, lips, tongue, or throat Irritability, confusion, fast or irregular heartbeat, muscle stiffness, twitching muscles, sweating, high fever, seizure, chills, vomiting, diarrhea, which may be signs of serotonin syndrome Side effects that usually do not require medical attention (report to your care team if they continue or are bothersome): Anxiety, nervousness Dizziness Drowsiness Headache Nausea Trouble sleeping This list may not describe all possible side effects. Call your doctor for medical advice about side effects. You  may report side effects to FDA at 1-800-FDA-1088. Where  should I keep my medication? Keep out of the reach of children. Store at room temperature below 30 degrees C (86 degrees F). Protect from light. Keep container tightly closed. Throw away any unused medication after the expiration date. NOTE: This sheet is a summary. It may not cover all possible information. If you have questions about this medicine, talk to your doctor, pharmacist, or health care provider.  2024 Elsevier/Gold Standard (2021-08-15 00:00:00)

## 2023-08-16 NOTE — Progress Notes (Signed)
 [facility] MD OP Progress Note  08/16/2023 3:02 PM Chase Mendez  MRN:  [id number]  Chief Complaint:  Chief Complaint  Patient presents with   Follow-up   Anxiety   Depression   Medication Refill   Discussed the use of AI scribe software for clinical note transcription with the patient, who gave verbal consent to proceed.  History of Present Illness Chase Mendez is an 89 year old Caucasian male, veteran, retired from [employer], lives in [place] with his wife, has a history of MDD, GAD, chronic pain, bereavement, foot drop left-sided, hypothyroidism, hypertension, history of frequent falls was evaluated in office today for a follow-up appointment.  He experiences persistent anxiety despite being on Zoloft . He has increased the dosage of Vistaril  to 25 mg, which has provided some relief by reducing the intensity of his anxiety. However, he seeks more effective management for his anxiety and depression.  He sleeps well at night and maintains a good appetite. He is coping better with grief related to the loss of his ex.  He is distressed about his son, who struggles with drug addiction and financial issues. His son has undergone detoxification twice but continues to relapse, which is emotionally taxing for him.  He has balance issues, with a history of stumbling and falling. He recalls an episode of vertigo while lying down, which was evaluated without a definitive cause being identified.  He is not on any new medications.  Denies thoughts of self-harm or harm to others.   Visit Diagnosis:    ICD-10-CM   1. MDD (major depressive disorder), recurrent, in full remission (HCC)  F33.42 busPIRone  (BUSPAR ) 5 MG tablet    2. GAD (generalized anxiety disorder)  F41.1 busPIRone  (BUSPAR ) 5 MG tablet    hydrOXYzine  (VISTARIL ) 25 MG capsule    3. Neurocognitive disorder  R41.9    Likely mild      Past Psychiatric History: I have reviewed past psychiatric history from progress note on  09/07/2022.  Past trials of medications like Vraylar( cost), Remeron , Celexa , risperidone , Cymbalta , Wellbutrin .  Past Medical History:  Past Medical History:  Diagnosis Date   Acute encephalopathy 12/08/2014   Anxiety    ARF (acute renal failure) (HCC) 12/08/2014   Back pain    Benign neoplasm of large bowel    Capsulitis    fractured displaced metatarsal with capsulitis   Chronic back pain    Coronavirus infection    Depression    Diabetes mellitus without complication (HCC)    no medications currently   Dysphagia    Exostosis    painful, right hallux   Foot drop, left    wears a brace   Frequent falls 02/2018   poor balance, foot drop   GERD (gastroesophageal reflux disease)    Gout    Hypertension    Hypothyroidism    Insomnia    Low testosterone     Microscopic hematuria 2016   Myocardial infarction [facility])    patient unaware when it happened years ago.     Pneumonia 12/07/2014   Pressure ulcer 12/09/2014   Sepsis (HCC) 12/08/2014   Thyroid  disease     Past Surgical History:  Procedure Laterality Date   APPENDECTOMY     BACK SURGERY  1995, 1996   x 2   CHOLECYSTECTOMY     ESOPHAGOGASTRODUODENOSCOPY (EGD) WITH PROPOFOL  N/A 04/24/2017   Procedure: ESOPHAGOGASTRODUODENOSCOPY (EGD) WITH PROPOFOL ;  Surgeon: Viktoria Lamar DASEN, MD;  Location: [facility] ENDOSCOPY;  Service: Endoscopy;  Laterality:  N/A;   EYE SURGERY Bilateral 1983, 1985   cataract extractions   SPINAL CORD STIMULATOR INSERTION N/A 03/13/2018   Procedure: SPINAL CORD STIMULATOR INSERTION;  Surgeon: Clois Fret, MD;  Location: [facility] ORS;  Service: Neurosurgery;  Laterality: N/A;    Family Psychiatric History: I have reviewed family psychiatric history from progress note on 09/07/2022.  Family History:  Family History  Problem Relation Age of Onset   Heart disease Mother    Diabetes Father    Kidney cancer Brother     Social History: I have reviewed social history from progress note on 09/07/2022. Social  History   Socioeconomic History   Marital status: Married    Spouse name: dorothy   Number of children: 2   Years of education: Not on file   Highest education level: Some college, no degree  Occupational History   Occupation: maintenance / repair    Comment: disabled  Tobacco Use   Smoking status: Every Day    Types: Cigars   Smokeless tobacco: Never   Tobacco comments:    unable to give cessation materials due to webex visit   Vaping Use   Vaping status: Never Used  Substance and Sexual Activity   Alcohol use: No   Drug use: Yes    Types: Oxycodone    Sexual activity: Not Currently  Other Topics Concern   Not on file  Social History Narrative   Not on file   Social Drivers of Health   Financial Resource Strain: Low Risk  (01/22/2023)   [facility]   Overall Financial Resource Strain (CARDIA)    Difficulty of Paying Living Expenses: Not hard at all  Food Insecurity: No Food Insecurity (01/22/2023)   [facility]   Hunger Vital Sign    Within the past 12 months, you worried that your food would run out before you got the money to buy more.: Never true    Within the past 12 months, the food you bought just didn't last and you didn't have money to get more.: Never true  Transportation Needs: No Transportation Needs (07/17/2022)   PRAPARE - Administrator, Civil Service (Medical): No    Lack of Transportation (Non-Medical): No  Physical Activity: Not on file  Stress: Not on file  Social Connections: Not on file    Allergies:  Allergies  Allergen Reactions   Wellbutrin  [Bupropion ]     hallucinations   Doxycycline Nausea Only   Ibuprofen Nausea Only   Sulfa Antibiotics Nausea Only    Metabolic Disorder Labs: Lab Results  Component Value Date   HGBA1C 5.6 07/18/2022   MPG 114.02 07/18/2022   No results found for: PROLACTIN Lab Results  Component Value Date   CHOL 92 (L) 06/07/2023    TRIG 111 06/07/2023   HDL 34 (L) 06/07/2023   CHOLHDL 2.7 06/07/2023   VLDL 23 12/08/2014   LDLCALC 37 06/07/2023   LDLCALC 42 03/02/2023   Lab Results  Component Value Date   TSH 0.906 06/07/2023   TSH 1.290 03/02/2023    Therapeutic Level Labs: No results found for: LITHIUM No results found for: VALPROATE No results found for: CBMZ  Current Medications: Current Outpatient Medications  Medication Sig Dispense Refill   acetaminophen  (TYLENOL ) 500 MG tablet Take 500-1,000 mg by mouth 2 (two) times daily as needed for mild pain.     allopurinol  (ZYLOPRIM ) 100 MG tablet Take 1 tablet (100 mg total) by mouth  every other day. 45 tablet 1   amLODipine  (NORVASC ) 5 MG tablet Take 2.5 mg by mouth daily.     aspirin  81 MG tablet Take 81 mg by mouth daily.     atorvastatin  (LIPITOR) 10 MG tablet TAKE 1 TABLET BY MOUTH EVERYDAY AT BEDTIME 90 tablet 1   busPIRone  (BUSPAR ) 5 MG tablet Take 1 tablet (5 mg total) by mouth 2 (two) times daily. 60 tablet 1   Cholecalciferol 50 MCG (2000 UT) TABS Take by mouth.     famotidine  (PEPCID ) 20 MG tablet Take 1 tablet (20 mg total) by mouth 2 (two) times daily. 180 tablet 1   fluticasone  (FLONASE ) 50 MCG/ACT nasal spray Place 1-2 sprays into both nostrils daily as needed for allergies or rhinitis.     gabapentin  (NEURONTIN ) 600 MG tablet TAKE 1 TABLET BY MOUTH TWICE DAILY. 180 tablet 1   isosorbide  mononitrate (IMDUR ) 30 MG 24 hr tablet TAKE 1 TABLET BY MOUTH EVERY DAY 90 tablet 0   levothyroxine  (SYNTHROID ) 150 MCG tablet Take 1 tablet (150 mcg total) by mouth daily. 90 tablet 1   losartan  (COZAAR ) 100 MG tablet TAKE 1 TABLET BY MOUTH EVERY DAY 90 tablet 1   mirabegron  ER (MYRBETRIQ ) 50 MG TB24 tablet TAKE ONE TABLET BY MOUTH ONE TIME DAILY 30 tablet 11   naloxone  (NARCAN ) nasal spray 4 mg/0.1 mL Place 1 spray into the nose as needed for up to 365 doses (for opioid-induced respiratory depresssion). In case of emergency (overdose), spray once into  each nostril. If no response within 3 minutes, repeat application and call 911. 1 each 0   ondansetron  (ZOFRAN -ODT) 4 MG disintegrating tablet TAKE 1 TABLET (4 MG TOTAL) BY MOUTH IN THE MORNING, AT NOON, IN THE EVENING, AND AT BEDTIME. 90 tablet 3   Oxycodone  HCl 10 MG TABS Take 1 tablet (10 mg total) by mouth every 6 (six) hours as needed. Must last 30 days. 120 tablet 0   Oxycodone  HCl 10 MG TABS Take 1 tablet (10 mg total) by mouth every 6 (six) hours as needed. Must last 30 days. 120 tablet 0   Oxycodone  HCl 10 MG TABS Take 1 tablet (10 mg total) by mouth every 6 (six) hours as needed. Must last 30 days. 120 tablet 0   sertraline  (ZOLOFT ) 50 MG tablet TAKE 1/2 TABLET BY MOUTH TWO TIMES DAILY 30 tablet 5   tamsulosin  (FLOMAX ) 0.4 MG CAPS capsule Take 0.4 mg by mouth daily.     Testosterone  10 MG/ACT (2%) GEL Place 10 mg onto the skin daily. 60 g 3   vitamin B-12 (CYANOCOBALAMIN ) 1000 MCG tablet Take 1,000 mcg by mouth daily.     Vitamin D, Ergocalciferol, (DRISDOL) 1.25 MG (50000 UNIT) CAPS capsule TAKE 1 CAPSULE BY MOUTH ONE TIME PER WEEK 12 capsule 3   vitamin E  400 UNIT capsule Take 400 Units by mouth daily.     hydrOXYzine  (VISTARIL ) 25 MG capsule Take 1 capsule (25 mg total) by mouth daily as needed for anxiety. 30 capsule 5   No current facility-administered medications for this visit.     Musculoskeletal: Strength & Muscle Tone: within normal limits Gait & Station: walks with walker Patient leans: Front  Psychiatric Specialty Exam: Review of Systems  Psychiatric/Behavioral:  The patient is nervous/anxious.     Blood pressure 138/64, pulse 88, temperature 98.4 F (36.9 C), temperature source Temporal, height 6' (1.829 m), weight 193 lb (87.5 kg), SpO2 97%.Body mass index is 26.18 kg/m.  General Appearance:  Casual  Eye Contact:  Fair  Speech:  Clear and Coherent  Volume:  Normal  Mood:  Anxious  Affect:  Appropriate  Thought Process:  Goal Directed and Descriptions of  Associations: Intact  Orientation:  Full (Time, Place, and Person)  Thought Content: Logical   Suicidal Thoughts:  No  Homicidal Thoughts:  No  Memory:  Immediate;   Fair Recent;   Fair Remote;   Poor  Judgement:  Fair  Insight:  Fair  Psychomotor Activity:  Decreased  Concentration:  Concentration: Fair and Attention Span: Fair  Recall:  [employer] of Knowledge: Fair  Language: Fair  Akathisia:  No  Handed:  Right  AIMS (if indicated): not done  Assets:  Manufacturing systems engineer Desire for Improvement Housing Social Support Transportation  ADL's:  Intact  Cognition: WNL  Sleep:  Fair   Screenings: GAD-7    Garment/textile technologist Visit from 08/16/2023 in [facility] Counselor from 05/15/2023 in [facility] Office Visit from 01/24/2023 in [facility] Counselor from 12/11/2022 in [facility] Office Visit from 12/05/2022 in [facility]  Total GAD-7 Score 9 0 11 18 10    PHQ2-9    Flowsheet Row Office Visit from 08/16/2023 in [facility] Office Visit from 05/30/2023 in [facility] Counselor from 05/15/2023 in [facility] Office Visit from 01/24/2023 in [facility] Counselor from 12/11/2022 in [facility]  PHQ-2 Total Score 3 0 0 5 5  PHQ-9 Total Score 6 -- 0 12 21   Flowsheet Row Office Visit from 08/16/2023 in [facility] Counselor from 05/15/2023 in [facility] Office Visit from 04/25/2023 in [facility]  C-SSRS RISK CATEGORY No Risk No Risk No Risk     Assessment and Plan:  JORDELL OUTTEN is a 89 year old Caucasian male, has a history of depression, neurocognitive disorder, anxiety was evaluated in office today.  Discussed assessment and plan as noted below.  Depression in full remission Currently denies any significant depression symptoms although does have anxiety regarding situational stressors. Continue Sertraline  50 mg half tablet twice a day. Higher dosage of Sertraline  caused side effects.  Generalized anxiety disorder-unstable Currently with multiple situational stressors which does affect his anxiety symptoms.  Agreeable to trial of BuSpar . Add BuSpar  5 mg twice daily Continue CBT with Ms. Perkins as needed Continue Hydroxyzine  25 mg daily as needed.  Cognitive impairment-chronic likely mild Cognitive function assessed today, currently with no significant changes. Will monitor closely  Follow-up Follow-up in clinic in 1 month or sooner if needed.   Collaboration of Care: Collaboration of Care: Referral or follow-up with counselor/therapist AEB and ways to continue psychotherapy sessions.  Patient/Guardian was advised Release of Information must be obtained prior to any record release in order to collaborate their care with an outside provider. Patient/Guardian was advised if they have not already done so to contact the registration department to sign all necessary forms in order for us  to release information regarding their care.   Consent: Patient/Guardian gives verbal consent for treatment and assignment of benefits for services provided during this visit. Patient/Guardian expressed understanding and agreed to proceed.  This note was generated in part or whole with voice recognition software. Voice recognition is usually  quite accurate but there are transcription errors that can and very often do occur. I apologize for any typographical errors that were not detected and corrected.  This note was generated in part or whole with voice recognition  software. Voice recognition is usually quite accurate but there are transcription errors that can and very often do occur. I apologize for any typographical errors that were not detected and corrected.      Kewanda Poland, MD 08/17/2023, 8:13 AM

## 2023-08-17 NOTE — Telephone Encounter (Signed)
 pt wife notified that rx was sent to pharmacy

## 2023-08-29 ENCOUNTER — Ambulatory Visit: Payer: PRIVATE HEALTH INSURANCE | Attending: Nurse Practitioner | Admitting: Nurse Practitioner

## 2023-08-29 ENCOUNTER — Encounter: Payer: Self-pay | Admitting: Nurse Practitioner

## 2023-08-29 DIAGNOSIS — M5442 Lumbago with sciatica, left side: Secondary | ICD-10-CM | POA: Insufficient documentation

## 2023-08-29 DIAGNOSIS — G894 Chronic pain syndrome: Secondary | ICD-10-CM | POA: Insufficient documentation

## 2023-08-29 DIAGNOSIS — Z79891 Long term (current) use of opiate analgesic: Secondary | ICD-10-CM | POA: Insufficient documentation

## 2023-08-29 DIAGNOSIS — G8929 Other chronic pain: Secondary | ICD-10-CM | POA: Diagnosis present

## 2023-08-29 DIAGNOSIS — Z79899 Other long term (current) drug therapy: Secondary | ICD-10-CM | POA: Diagnosis present

## 2023-08-29 DIAGNOSIS — M5417 Radiculopathy, lumbosacral region: Secondary | ICD-10-CM | POA: Diagnosis not present

## 2023-08-29 DIAGNOSIS — M961 Postlaminectomy syndrome, not elsewhere classified: Secondary | ICD-10-CM | POA: Insufficient documentation

## 2023-08-29 DIAGNOSIS — M79605 Pain in left leg: Secondary | ICD-10-CM | POA: Diagnosis not present

## 2023-08-29 MED ORDER — OXYCODONE HCL 10 MG PO TABS: 10.0000 mg | ORAL_TABLET | Freq: Four times a day (QID) | ORAL | 0 refills | Status: DC | PRN

## 2023-08-29 MED ORDER — OXYCODONE HCL 10 MG PO TABS
10.0000 mg | ORAL_TABLET | Freq: Four times a day (QID) | ORAL | 0 refills | Status: DC | PRN
Start: 2023-11-04 — End: 2023-11-26

## 2023-08-29 MED ORDER — OXYCODONE HCL 10 MG PO TABS
10.0000 mg | ORAL_TABLET | Freq: Four times a day (QID) | ORAL | 0 refills | Status: DC | PRN
Start: 2023-09-05 — End: 2023-11-26

## 2023-08-29 NOTE — Progress Notes (Signed)
 Nursing Pain Medication Assessment:  Safety precautions to be maintained throughout the outpatient stay will include: orient to surroundings, keep bed in low position, maintain call bell within reach at all times, provide assistance with transfer out of bed and ambulation.  Medication Inspection Compliance: Pill count conducted under aseptic conditions, in front of the patient. Neither the pills nor the bottle was removed from the patient's sight at any time. Once count was completed pills were immediately returned to the patient in their original bottle.  Medication: Oxycodone  IR Pill/Patch Count: 29 of 120 pills/patches remain Pill/Patch Appearance: Markings consistent with prescribed medication Bottle Appearance: Standard pharmacy container. Clearly labeled. Filled Date: 06 / 30 / 2025 Last Medication intake:  Today

## 2023-08-29 NOTE — Progress Notes (Signed)
 PROVIDER NOTE: Interpretation of information contained herein should be left to medically-trained personnel. Specific patient instructions are provided elsewhere under Patient Instructions section of medical record. This document was created in part using AI and STT-dictation technology, any transcriptional errors that may result from this process are unintentional.  Patient: Chase Mendez  Service: E/M   PCP: Albina GORMAN Dine, MD  DOB: 05-28-1934  DOS: 08/29/2023  Provider: Emmy MARLA Blanch, NP  MRN: [id number]  Delivery: Face-to-face  Specialty: Interventional Pain Management  Type: Established Patient  Setting: Ambulatory outpatient facility  Specialty designation: 09  Referring Prov.: Albina GORMAN Dine, MD  Location: Outpatient office facility       History of present illness (HPI) Chase Mendez, a 89 y.o. year old male, is here today because of his No primary diagnosis found.. Chase Mendez primary complain today is Hip Pain (left)  Pertinent problems: Mr. Space has Spinal accessory neuropathy; Lower limb pain, inferior (L5) (Foot Drop) (Left); Chronic low back pain (2ry area of pain) (Bilateral) (ML) (L>R)w/ sciatica (Left); Chronic lower extremity pain (1ry area of pain) (Left); Failed back surgical syndrome; Lumbosacral radiculopathy at L5 (Left); DDD (degenerative disc disease, lumbar; and Chronic pain syndrome on their pertinent problem list.   Pain Assessment: Severity of Chronic pain is reported as a 6 /10. Location: Hip Left/radiates down to left leg. Onset: More than a month ago. Quality: Aching. Timing: Constant. Modifying factor(s): medications. Vitals:  height is 6' (1.829 m) and weight is 197 lb (89.4 kg). His temperature is 98.2 F (36.8 C). His blood pressure is 127/62 and his pulse is 66. His respiration is 18 and oxygen saturation is 91%.  BMI: Estimated body mass index is 26.72 kg/m as calculated from the following:   Height as of this encounter: 6' (1.829 m).   Weight  as of this encounter: 197 lb (89.4 kg).  Last encounter: 05/30/2023. Last procedure: Visit date not found.  Reason for encounter: medication management.   The patient indicates doing well with the current medication regimen.  No side effects or any adverse reaction reported to the medication.  The patient had a fall due to his knee giving out and presented to the visit with active bleeding.  Examination revealed a superficial wound involving the outer layer of skin on his right hand.  The nurse cleaned the area and applied Steri-Strips, Gauge, and Tegaderm for protection.   Pharmacotherapy Assessment   Oxycodone  HCl 10 mg tablets every 6 hours as needed for pain. MME=60  Monitoring: [place] PMP: PDMP reviewed during this encounter.       Pharmacotherapy: No side-effects or adverse reactions reported. Compliance: No problems identified. Effectiveness: Clinically acceptable.  Rebecka Wolm CHRISTELLA, RN  08/29/2023 11:15 AM  Sign when Signing Visit Nursing Pain Medication Assessment:  Safety precautions to be maintained throughout the outpatient stay will include: orient to surroundings, keep bed in low position, maintain call bell within reach at all times, provide assistance with transfer out of bed and ambulation.  Medication Inspection Compliance: Pill count conducted under aseptic conditions, in front of the patient. Neither the pills nor the bottle was removed from the patient's sight at any time. Once count was completed pills were immediately returned to the patient in their original bottle.  Medication: Oxycodone  IR Pill/Patch Count: 29 of 120 pills/patches remain Pill/Patch Appearance: Markings consistent with prescribed medication Bottle Appearance: Standard pharmacy container. Clearly labeled. Filled Date: 06 / 30 / 2025 Last Medication intake:  Today  UDS:  Summary  Date Value Ref Range Status  09/05/2022 Note  Final    Comment:     ==================================================================== [employer] [facility] (MW) ==================================================================== Test                             Result       Flag       Units  Drug Present and Declared for Prescription Verification   Oxycodone                       3918         EXPECTED   ng/mg creat   Oxymorphone                    979          EXPECTED   ng/mg creat   Noroxycodone                   7697         EXPECTED   ng/mg creat   Noroxymorphone                 868          EXPECTED   ng/mg creat    Sources of oxycodone  are scheduled prescription medications.    Oxymorphone, noroxycodone, and noroxymorphone are expected    metabolites of oxycodone . Oxymorphone is also available as a    scheduled prescription medication.  ==================================================================== Test                      Result    Flag   Units      Ref Range   Creatinine              34               mg/dL      >=79 ==================================================================== Declared Medications:  The flagging and interpretation on this report are based on the  following declared medications.  Unexpected results may arise from  inaccuracies in the declared medications.   **Note: The testing scope of this panel includes these medications:   Oxycodone    **Note: The testing scope of this panel does not include the  following reported medications:   Acetaminophen  (Tylenol )  Allopurinol   Amlodipine   Aspirin   Atorvastatin   Diphenhydramine (Benadryl)  Famotidine   Fluticasone  (Flonase )  Gabapentin  (Neurontin )  Isosorbide  (Imdur )  Levothyroxine   Losartan   Mirabegron  (Myrbetriq )  Naloxone  (Narcan )  Ondansetron   Sertraline  (Zoloft )  Tamsulosin  (Flomax )  Testosterone   Vitamin B12  Vitamin D2 (Drisdol)  Vitamin E  ==================================================================== For clinical consultation, please call  [phone]. ====================================================================     No results found for: CBDTHCR No results found for: D8THCCBX No results found for: D9THCCBX  ROS  Constitutional: Denies any fever or chills Gastrointestinal: No reported hemesis, hematochezia, vomiting, or acute GI distress Musculoskeletal: Hip pain Neurological: No reported episodes of acute onset apraxia, aphasia, dysarthria, agnosia, amnesia, paralysis, loss of coordination, or loss of consciousness  Medication Review  Cholecalciferol, Oxycodone  HCl, Testosterone , Vitamin D (Ergocalciferol), acetaminophen , allopurinol , amLODipine , aspirin , atorvastatin , busPIRone , cyanocobalamin , diphenhydrAMINE, famotidine , fluticasone , gabapentin , hydrOXYzine , isosorbide  mononitrate, levothyroxine , losartan , mirabegron  ER, naloxone , ondansetron , sertraline , tamsulosin , and vitamin E   History Review  Allergy: Chase Mendez is allergic to wellbutrin  [bupropion ], doxycycline, ibuprofen, and sulfa antibiotics. Drug: Chase Mendez  reports current drug use. Drug: Oxycodone . Alcohol:  reports no history of alcohol use. Tobacco:  reports that he has been smoking cigars. He has never used smokeless tobacco. Social: Chase Mendez  reports that he has been smoking cigars. He has never used smokeless tobacco. He reports current drug use. Drug: Oxycodone . He reports that he does not drink alcohol. Medical:  has a past medical history of Acute encephalopathy (12/08/2014), Anxiety, ARF (acute renal failure) (HCC) (12/08/2014), Back pain, Benign neoplasm of large bowel, Capsulitis, Chronic back pain, Coronavirus infection, Depression, Diabetes mellitus without complication (HCC), Dysphagia, Exostosis, Foot drop, left, Frequent falls (02/2018), GERD (gastroesophageal reflux disease), Gout, Hypertension, Hypothyroidism, Insomnia, Low testosterone , Microscopic hematuria (2016), Myocardial infarction (HCC), Pneumonia (12/07/2014), Pressure  ulcer (12/09/2014), Sepsis (HCC) (12/08/2014), and Thyroid  disease. Surgical: Chase Mendez  has a past surgical history that includes Cholecystectomy; Esophagogastroduodenoscopy (egd) with propofol  (N/A, 04/24/2017); Back surgery (1995, 1996); Eye surgery (Bilateral, 1983, 1985); Appendectomy; and Spinal cord stimulator insertion (N/A, 03/13/2018). Family: family history includes Diabetes in his father; Heart disease in his mother; Kidney cancer in his brother.  Laboratory Chemistry Profile   Renal Lab Results  Component Value Date   BUN 17 06/07/2023   CREATININE 1.25 06/07/2023   BCR 14 06/07/2023   GFRAA >60 01/28/2019   GFRNONAA >60 07/19/2022    Hepatic Lab Results  Component Value Date   AST 21 06/07/2023   ALT 13 06/07/2023   ALBUMIN 3.9 06/07/2023   ALKPHOS 124 (H) 06/07/2023   HCVAB <0.1 07/24/2018   LIPASE 37 08/21/2016    Electrolytes Lab Results  Component Value Date   NA 142 06/07/2023   K 4.9 06/07/2023   CL 104 06/07/2023   CALCIUM  8.9 06/07/2023   MG 2.2 07/18/2022   PHOS 2.6 07/19/2022    Bone Lab Results  Component Value Date   TESTOSTERONE  69 (L) 04/27/2023    Inflammation (CRP: Acute Phase) (ESR: Chronic Phase) Lab Results  Component Value Date   CRP 6.6 (H) 07/19/2022   LATICACIDVEN 1.7 07/17/2022         Note: Above Lab results reviewed.  Recent Imaging Review  US  Venous Img Lower Bilateral (DVT) CLINICAL DATA:  13689 Swelling 13689  EXAM: BILATERAL LOWER EXTREMITY VENOUS DOPPLER ULTRASOUND  TECHNIQUE: Gray-scale sonography with graded compression, as well as color Doppler and duplex ultrasound were performed to evaluate the lower extremity deep venous systems from the level of the Mendez femoral vein and including the Mendez femoral, femoral, profunda femoral, popliteal and calf veins including the posterior tibial, peroneal and gastrocnemius veins when visible. The superficial great saphenous vein was also interrogated. Spectral Doppler  was utilized to evaluate flow at rest and with distal augmentation maneuvers in the Mendez femoral, femoral and popliteal veins.  COMPARISON:  None Available.  FINDINGS: RIGHT LOWER EXTREMITY  Mendez Femoral Vein: No evidence of thrombus. Normal compressibility, respiratory phasicity and response to augmentation.  Saphenofemoral Junction: No evidence of thrombus. Normal compressibility and flow on color Doppler imaging.  Profunda Femoral Vein: No evidence of thrombus. Normal compressibility and flow on color Doppler imaging.  Femoral Vein: No evidence of thrombus. Normal compressibility, respiratory phasicity and response to augmentation.  Popliteal Vein: No evidence of thrombus. Normal compressibility, respiratory phasicity and response to augmentation.  Calf Veins: Calf veins were poorly visualized on grayscale imaging, but were without evidence of thrombus on color Doppler evaluation  Superficial Great Saphenous Vein: No evidence of thrombus. Normal compressibility.  Venous Reflux:  None.  Other Findings:  None.  LEFT LOWER EXTREMITY  Mendez Femoral Vein: No evidence of thrombus. Normal compressibility,  respiratory phasicity and response to augmentation.  Saphenofemoral Junction: No evidence of thrombus. Normal compressibility and flow on color Doppler imaging.  Profunda Femoral Vein: No evidence of thrombus. Normal compressibility and flow on color Doppler imaging.  Femoral Vein: No evidence of thrombus. Normal compressibility, respiratory phasicity and response to augmentation.  Popliteal Vein: No evidence of thrombus. Normal compressibility, respiratory phasicity and response to augmentation.  Calf Veins: Calf veins were poorly visualized on grayscale imaging, but were without evidence of thrombus on color Doppler evaluation  Superficial Great Saphenous Vein: No evidence of thrombus. Normal compressibility.  Venous Reflux:  None.  Other Findings: Soft  tissue swelling in the left-greater-than-right lower extremity.  IMPRESSION: No evidence of deep venous thrombosis in either lower extremity.  Electronically Signed   By: Lyndall Gore M.D.   On: 07/17/2022 15:43 CT Angio Chest PE W/Cm &/Or Wo Cm CLINICAL DATA:  Pulmonary embolism suspected, high probability  EXAM: CT ANGIOGRAPHY CHEST WITH CONTRAST  TECHNIQUE: Multidetector CT imaging of the chest was performed using the standard protocol during bolus administration of intravenous contrast. Multiplanar CT image reconstructions and MIPs were obtained to evaluate the vascular anatomy.  RADIATION DOSE REDUCTION: This exam was performed according to the departmental dose-optimization program which includes automated exposure control, adjustment of the mA and/or kV according to patient size and/or use of iterative reconstruction technique.  CONTRAST:  50mL OMNIPAQUE  IOHEXOL  350 MG/ML SOLN  COMPARISON:  None Available.  FINDINGS: Cardiovascular: Conventional 3 vessel arch anatomy. Atherosclerotic calcifications throughout the aorta and along the coronary arteries. Mild cardiomegaly. The pulmonary arteries are well opacified to the proximal segmental level. No evidence of acute pulmonary embolus. No pericardial effusion.  Mediastinum/Nodes: Unremarkable CT appearance of the thyroid  gland. No suspicious mediastinal or hilar adenopathy. No soft tissue mediastinal mass. Small hiatal hernia.  Lungs/Pleura: Multifocal ground-glass attenuation airspace opacities and small nodules throughout the right upper lobe in a peribronchovascular distribution. Diffuse right lower lobe bronchial wall thickening with a combination of peribronchovascular patchy airspace opacities and volume loss. Similar but less impressive findings in the left lower lobe. Mild background of centrilobular pulmonary emphysema. No pleural effusion or pneumothorax.  Upper Abdomen: Submucosal edema at the hepatic  flexure and proximal ascending colon. Inflammatory stranding is present in the pericolonic fat. The liver demonstrates a cirrhotic morphology with faint nodularity and hypertrophy of the left lobe relative to the right. The gallbladder is surgically absent.  Musculoskeletal: No chest wall abnormality. No acute or significant osseous findings. Epidural spinal stimulator in place.  Review of the MIP images confirms the above findings.  IMPRESSION: 1. CT findings are most consistent with multi lobar pneumonia involving the right upper, right lower and, to a lesser extent, the left lower lobes. 2. Negative for acute pulmonary embolus. 3. Additionally, the visualized hepatic flexure and proximal transverse colon are abnormal with diffuse wall thickening and pericolonic inflammatory stranding. Findings suggest active infectious/inflammatory colitis. 4. Aortic and coronary artery atherosclerotic vascular calcifications. 5. Mild centrilobular pulmonary emphysema. 6. Small hiatal hernia.  Aortic Atherosclerosis (ICD10-I70.0) and Emphysema (ICD10-J43.9).  Electronically Signed   By: Wilkie Lent M.D.   On: 07/17/2022 08:19 DG Chest Port 1 View CLINICAL DATA:  Questionable sepsis  EXAM: PORTABLE CHEST 1 VIEW  COMPARISON:  01/27/2019  FINDINGS: Low volume chest with interstitial crowding at the bases. No edema, effusion, or pneumothorax. Normal heart size and mediastinal contours for technique. Spinal cord stimulator the lower thoracic level.  IMPRESSION: Low volume chest with atelectatic type density at  the bases. Early bronchopneumonia is not excluded.  Electronically Signed   By: Dorn Roulette M.D.   On: 07/17/2022 05:50 Note: Reviewed        Physical Exam  Vitals: BP 127/62   Pulse 66   Temp 98.2 F (36.8 C)   Resp 18   Ht 6' (1.829 m)   Wt 197 lb (89.4 kg)   SpO2 91%   BMI 26.72 kg/m  BMI: Estimated body mass index is 26.72 kg/m as calculated from the  following:   Height as of this encounter: 6' (1.829 m).   Weight as of this encounter: 197 lb (89.4 kg). Ideal: Ideal body weight: 77.6 kg (171 lb 1.2 oz) Adjusted ideal body weight: 82.3 kg (181 lb 7.1 oz) General appearance: Well nourished, well developed, and well hydrated. In no apparent acute distress Mental status: Alert, oriented x 3 (person, place, & time)       Respiratory: No evidence of acute respiratory distress Eyes: PERLA   Assessment   Diagnosis Status  1. Chronic lower extremity pain (1ry area of Pain) (Left)   2. Chronic low back pain (2ry area of Pain) (Bilateral) (ML) (L>R) w/ sciatica (Left)   3. Lumbosacral radiculopathy at L5 (Left)   4. Failed back surgical syndrome   5. Chronic pain syndrome   6. Pharmacologic therapy   7. Chronic use of opiate for therapeutic purpose   8. Encounter for medication management   9. Encounter for chronic pain management    Controlled Controlled Controlled   Updated Problems: No problems updated.  Plan of Care  Problem-specific:  Assessment and Plan We will continue on current medication regimen.  Prescribing drug monitoring (PDMP) reviewed; findings consistent with the use of prescribed medication and no evidence of narcotic misuse or abuse.  Urine drug screening (UDS) up-to-date.  Schedule follow-up in 90 days for medication management.   Chase Mendez has a current medication list which includes the following long-term medication(s): allopurinol , amlodipine , atorvastatin , famotidine , gabapentin , isosorbide  mononitrate, levothyroxine , losartan , mirabegron  er, naloxone , sertraline , testosterone , [START ON 09/05/2023] oxycodone  hcl, [START ON 10/05/2023] oxycodone  hcl, [START ON 11/04/2023] oxycodone  hcl, and [DISCONTINUED] diphenhydramine.  Pharmacotherapy (Medications Ordered): Meds ordered this encounter  Medications   Oxycodone  HCl 10 MG TABS    Sig: Take 1 tablet (10 mg total) by mouth every 6 (six) hours as  needed. Must last 30 days.    Dispense:  120 tablet    Refill:  0    DO NOT: delete (not duplicate); no partial-fill (will deny script to complete), no refill request (F/U required). DISPENSE: 1 day early if closed on fill date. WARN: No CNS-depressants within 8 hrs of med.   Oxycodone  HCl 10 MG TABS    Sig: Take 1 tablet (10 mg total) by mouth every 6 (six) hours as needed. Must last 30 days.    Dispense:  120 tablet    Refill:  0    DO NOT: delete (not duplicate); no partial-fill (will deny script to complete), no refill request (F/U required). DISPENSE: 1 day early if closed on fill date. WARN: No CNS-depressants within 8 hrs of med.   Oxycodone  HCl 10 MG TABS    Sig: Take 1 tablet (10 mg total) by mouth every 6 (six) hours as needed. Must last 30 days.    Dispense:  120 tablet    Refill:  0    DO NOT: delete (not duplicate); no partial-fill (will deny script to complete), no refill request (  F/U required). DISPENSE: 1 day early if closed on fill date. WARN: No CNS-depressants within 8 hrs of med.   Orders:  No orders of the defined types were placed in this encounter.       Return in about 3 months (around 11/29/2023) for (F2F), (MM), Emmy Blanch NP.    Recent Visits No visits were found meeting these conditions. Showing recent visits within past 90 days and meeting all other requirements Today's Visits Date Type Provider Dept  08/29/23 Office Visit Cypress Hinkson K, NP [facility]  Showing today's visits and meeting all other requirements Future Appointments Date Type Provider Dept  11/26/23 Appointment Charla Criscione K, NP [facility]  Showing future appointments within next 90 days and meeting all other requirements  I discussed the assessment and treatment plan with the patient. The patient was provided an opportunity to ask questions and all were answered. The patient agreed with the plan and demonstrated an understanding of the instructions.  Patient  advised to call back or seek an in-person evaluation if the symptoms or condition worsens.  Duration of encounter: 30 minutes.  Total time on encounter, as per AMA guidelines included both the face-to-face and non-face-to-face time personally spent by the physician and/or other qualified health care professional(s) on the day of the encounter (includes time in activities that require the physician or other qualified health care professional and does not include time in activities normally performed by clinical staff). Physician's time may include the following activities when performed: Preparing to see the patient (e.g., pre-charting review of records, searching for previously ordered imaging, lab work, and nerve conduction tests) Review of prior analgesic pharmacotherapies. Reviewing PMP Interpreting ordered tests (e.g., lab work, imaging, nerve conduction tests) Performing post-procedure evaluations, including interpretation of diagnostic procedures Obtaining and/or reviewing separately obtained history Performing a medically appropriate examination and/or evaluation Counseling and educating the patient/family/caregiver Ordering medications, tests, or procedures Referring and communicating with other health care professionals (when not separately reported) Documenting clinical information in the electronic or other health record Independently interpreting results (not separately reported) and communicating results to the patient/ family/caregiver Care coordination (not separately reported)  Note by: Edison Wollschlager K Prabhjot Piscitello, NP (TTS and AI technology used. I apologize for any typographical errors that were not detected and corrected.) Date: 08/29/2023; Time: 11:54 AM

## 2023-09-02 ENCOUNTER — Other Ambulatory Visit: Payer: Self-pay | Admitting: Internal Medicine

## 2023-09-02 DIAGNOSIS — I10 Essential (primary) hypertension: Secondary | ICD-10-CM

## 2023-09-08 ENCOUNTER — Other Ambulatory Visit: Payer: Self-pay | Admitting: Internal Medicine

## 2023-09-08 DIAGNOSIS — M1A40X0 Other secondary chronic gout, unspecified site, without tophus (tophi): Secondary | ICD-10-CM

## 2023-09-10 ENCOUNTER — Other Ambulatory Visit: Payer: Self-pay | Admitting: Internal Medicine

## 2023-09-10 ENCOUNTER — Other Ambulatory Visit

## 2023-09-10 DIAGNOSIS — E039 Hypothyroidism, unspecified: Secondary | ICD-10-CM | POA: Diagnosis not present

## 2023-09-10 DIAGNOSIS — E782 Mixed hyperlipidemia: Secondary | ICD-10-CM | POA: Diagnosis not present

## 2023-09-10 DIAGNOSIS — E291 Testicular hypofunction: Secondary | ICD-10-CM

## 2023-09-10 DIAGNOSIS — I10 Essential (primary) hypertension: Secondary | ICD-10-CM | POA: Diagnosis not present

## 2023-09-11 LAB — CBC WITH DIFF/PLATELET
Basophils Absolute: 0.1 x10E3/uL (ref 0.0–0.2)
Basos: 1 %
EOS (ABSOLUTE): 0.4 x10E3/uL (ref 0.0–0.4)
Eos: 6 %
Hematocrit: 38.4 % (ref 37.5–51.0)
Hemoglobin: 11.5 g/dL — ABNORMAL LOW (ref 13.0–17.7)
Immature Grans (Abs): 0 x10E3/uL (ref 0.0–0.1)
Immature Granulocytes: 0 %
Lymphocytes Absolute: 1.9 x10E3/uL (ref 0.7–3.1)
Lymphs: 27 %
MCH: 24.6 pg — ABNORMAL LOW (ref 26.6–33.0)
MCHC: 29.9 g/dL — ABNORMAL LOW (ref 31.5–35.7)
MCV: 82 fL (ref 79–97)
Monocytes Absolute: 0.8 x10E3/uL (ref 0.1–0.9)
Monocytes: 11 %
Neutrophils Absolute: 3.9 x10E3/uL (ref 1.4–7.0)
Neutrophils: 55 %
Platelets: 166 x10E3/uL (ref 150–450)
RBC: 4.68 x10E6/uL (ref 4.14–5.80)
RDW: 14.6 % (ref 11.6–15.4)
WBC: 7.1 x10E3/uL (ref 3.4–10.8)

## 2023-09-11 LAB — COMPREHENSIVE METABOLIC PANEL WITH GFR
ALT: 15 IU/L (ref 0–44)
AST: 25 IU/L (ref 0–40)
Albumin: 3.7 g/dL (ref 3.7–4.7)
Alkaline Phosphatase: 135 IU/L — ABNORMAL HIGH (ref 44–121)
BUN/Creatinine Ratio: 12 (ref 10–24)
BUN: 14 mg/dL (ref 8–27)
Bilirubin Total: 0.5 mg/dL (ref 0.0–1.2)
CO2: 24 mmol/L (ref 20–29)
Calcium: 8.6 mg/dL (ref 8.6–10.2)
Chloride: 103 mmol/L (ref 96–106)
Creatinine, Ser: 1.21 mg/dL (ref 0.76–1.27)
Globulin, Total: 2.8 g/dL (ref 1.5–4.5)
Glucose: 132 mg/dL — ABNORMAL HIGH (ref 70–99)
Potassium: 4.6 mmol/L (ref 3.5–5.2)
Sodium: 141 mmol/L (ref 134–144)
Total Protein: 6.5 g/dL (ref 6.0–8.5)
eGFR: 58 mL/min/1.73 — ABNORMAL LOW (ref >59)

## 2023-09-11 LAB — LIPID PANEL
Chol/HDL Ratio: 2.6 ratio (ref 0.0–5.0)
Cholesterol, Total: 86 mg/dL — ABNORMAL LOW (ref 100–199)
HDL: 33 mg/dL — ABNORMAL LOW (ref >39)
LDL Chol Calc (NIH): 35 mg/dL (ref 0–99)
Triglycerides: 93 mg/dL (ref 0–149)
VLDL Cholesterol Cal: 18 mg/dL (ref 5–40)

## 2023-09-11 LAB — TSH: TSH: 0.828 u[IU]/mL (ref 0.450–4.500)

## 2023-09-12 ENCOUNTER — Ambulatory Visit (INDEPENDENT_AMBULATORY_CARE_PROVIDER_SITE_OTHER): Admitting: Internal Medicine

## 2023-09-12 ENCOUNTER — Encounter: Payer: Self-pay | Admitting: Internal Medicine

## 2023-09-12 ENCOUNTER — Ambulatory Visit: Payer: Self-pay | Admitting: Internal Medicine

## 2023-09-12 VITALS — BP 127/80 | HR 100 | Ht 72.0 in | Wt 197.0 lb

## 2023-09-12 DIAGNOSIS — E039 Hypothyroidism, unspecified: Secondary | ICD-10-CM | POA: Diagnosis not present

## 2023-09-12 DIAGNOSIS — E139 Other specified diabetes mellitus without complications: Secondary | ICD-10-CM

## 2023-09-12 DIAGNOSIS — F17209 Nicotine dependence, unspecified, with unspecified nicotine-induced disorders: Secondary | ICD-10-CM | POA: Diagnosis not present

## 2023-09-12 DIAGNOSIS — E782 Mixed hyperlipidemia: Secondary | ICD-10-CM

## 2023-09-12 DIAGNOSIS — R441 Visual hallucinations: Secondary | ICD-10-CM | POA: Insufficient documentation

## 2023-09-12 DIAGNOSIS — Z0130 Encounter for examination of blood pressure without abnormal findings: Secondary | ICD-10-CM

## 2023-09-12 LAB — POCT CBG (FASTING - GLUCOSE)-MANUAL ENTRY: Glucose Fasting, POC: 154 mg/dL — AB (ref 70–99)

## 2023-09-12 MED ORDER — LEVOTHYROXINE SODIUM 150 MCG PO TABS
150.0000 ug | ORAL_TABLET | Freq: Every day | ORAL | 1 refills | Status: AC
Start: 2023-09-12 — End: 2024-03-10

## 2023-09-12 NOTE — Progress Notes (Signed)
 Established Patient Office Visit  Subjective:  Patient ID: Chase Mendez, male    DOB: 11-02-1934  Age: 89 y.o. MRN: [id number]  Chief Complaint  Patient presents with   Follow-up    Lab results     Clemens twice in the last month and sustained a skin tear to his right forearm. Wife reports that he has become more confused recently with hallucinations since his meds were adjusted by his psychiatrist. Labs reviewed and notable for well controlled lipids and satisfactory TSH.    No other concerns at this time.   Past Medical History:  Diagnosis Date   Acute encephalopathy 12/08/2014   Anxiety    ARF (acute renal failure) (HCC) 12/08/2014   Back pain    Benign neoplasm of large bowel    Capsulitis    fractured displaced metatarsal with capsulitis   Chronic back pain    Coronavirus infection    Depression    Diabetes mellitus without complication (HCC)    no medications currently   Dysphagia    Exostosis    painful, right hallux   Foot drop, left    wears a brace   Frequent falls 02/2018   poor balance, foot drop   GERD (gastroesophageal reflux disease)    Gout    Hypertension    Hypothyroidism    Insomnia    Low testosterone     Microscopic hematuria 2016   Myocardial infarction [facility])    patient unaware when it happened years ago.     Pneumonia 12/07/2014   Pressure ulcer 12/09/2014   Sepsis (HCC) 12/08/2014   Thyroid  disease     Past Surgical History:  Procedure Laterality Date   APPENDECTOMY     BACK SURGERY  1995, 1996   x 2   CHOLECYSTECTOMY     ESOPHAGOGASTRODUODENOSCOPY (EGD) WITH PROPOFOL  N/A 04/24/2017   Procedure: ESOPHAGOGASTRODUODENOSCOPY (EGD) WITH PROPOFOL ;  Surgeon: Viktoria Lamar DASEN, MD;  Location: [facility] ENDOSCOPY;  Service: Endoscopy;  Laterality: N/A;   EYE SURGERY Bilateral 1983, 1985   cataract extractions   SPINAL CORD STIMULATOR INSERTION N/A 03/13/2018   Procedure: SPINAL CORD STIMULATOR INSERTION;  Surgeon: Clois Fret, MD;  Location:  [facility] ORS;  Service: Neurosurgery;  Laterality: N/A;    Social History   Socioeconomic History   Marital status: Married    Spouse name: dorothy   Number of children: 2   Years of education: Not on file   Highest education level: Some college, no degree  Occupational History   Occupation: maintenance / repair    Comment: disabled  Tobacco Use   Smoking status: Every Day    Types: Cigars   Smokeless tobacco: Never   Tobacco comments:    unable to give cessation materials due to webex visit   Vaping Use   Vaping status: Never Used  Substance and Sexual Activity   Alcohol use: No   Drug use: Yes    Types: Oxycodone    Sexual activity: Not Currently  Other Topics Concern   Not on file  Social History Narrative   Not on file   Social Drivers of Health   Financial Resource Strain: Low Risk  (01/22/2023)   Received from [facility]   Overall Financial Resource Strain (CARDIA)    Difficulty of Paying Living Expenses: Not hard at all  Food Insecurity: No Food Insecurity (01/22/2023)   Received from [facility]   Hunger Vital Sign    Within the past 12 months, you  worried that your food would run out before you got the money to buy more.: Never true    Within the past 12 months, the food you bought just didn't last and you didn't have money to get more.: Never true  Transportation Needs: No Transportation Needs (07/17/2022)   PRAPARE - Administrator, Civil Service (Medical): No    Lack of Transportation (Non-Medical): No  Physical Activity: Not on file  Stress: Not on file  Social Connections: Not on file  Intimate Partner Violence: Not At Risk (07/17/2022)   Humiliation, Afraid, Rape, and Kick questionnaire    Fear of Current or Ex-Partner: No    Emotionally Abused: No    Physically Abused: No    Sexually Abused: No    Family History  Problem Relation Age of Onset   Heart disease Mother    Diabetes Father    Kidney  cancer Brother     Allergies  Allergen Reactions   Wellbutrin  [Bupropion ]     hallucinations   Doxycycline Nausea Only   Ibuprofen Nausea Only   Sulfa Antibiotics Nausea Only    Outpatient Medications Prior to Visit  Medication Sig   acetaminophen  (TYLENOL ) 500 MG tablet Take 500-1,000 mg by mouth 2 (two) times daily as needed for mild pain.   allopurinol  (ZYLOPRIM ) 100 MG tablet TAKE 1 TABLET BY MOUTH EVERY OTHER DAY   amLODipine  (NORVASC ) 5 MG tablet Take 2.5 mg by mouth daily.   aspirin  81 MG tablet Take 81 mg by mouth daily.   atorvastatin  (LIPITOR) 10 MG tablet TAKE 1 TABLET BY MOUTH EVERYDAY AT BEDTIME   busPIRone  (BUSPAR ) 5 MG tablet Take 1 tablet (5 mg total) by mouth 2 (two) times daily.   Cholecalciferol 50 MCG (2000 UT) TABS Take by mouth.   famotidine  (PEPCID ) 20 MG tablet Take 1 tablet (20 mg total) by mouth 2 (two) times daily.   fluticasone  (FLONASE ) 50 MCG/ACT nasal spray Place 1-2 sprays into both nostrils daily as needed for allergies or rhinitis.   gabapentin  (NEURONTIN ) 600 MG tablet TAKE 1 TABLET BY MOUTH TWICE DAILY.   hydrOXYzine  (VISTARIL ) 25 MG capsule Take 1 capsule (25 mg total) by mouth daily as needed for anxiety.   isosorbide  mononitrate (IMDUR ) 30 MG 24 hr tablet TAKE 1 TABLET BY MOUTH EVERY DAY   losartan  (COZAAR ) 100 MG tablet TAKE 1 TABLET BY MOUTH EVERY DAY   mirabegron  ER (MYRBETRIQ ) 50 MG TB24 tablet TAKE ONE TABLET BY MOUTH ONE TIME DAILY   naloxone  (NARCAN ) nasal spray 4 mg/0.1 mL Place 1 spray into the nose as needed for up to 365 doses (for opioid-induced respiratory depresssion). In case of emergency (overdose), spray once into each nostril. If no response within 3 minutes, repeat application and call 911.   ondansetron  (ZOFRAN -ODT) 4 MG disintegrating tablet TAKE 1 TABLET (4 MG TOTAL) BY MOUTH IN THE MORNING, AT NOON, IN THE EVENING, AND AT BEDTIME.   Oxycodone  HCl 10 MG TABS Take 1 tablet (10 mg total) by mouth every 6 (six) hours as needed.  Must last 30 days.   [START ON 10/05/2023] Oxycodone  HCl 10 MG TABS Take 1 tablet (10 mg total) by mouth every 6 (six) hours as needed. Must last 30 days.   [START ON 11/04/2023] Oxycodone  HCl 10 MG TABS Take 1 tablet (10 mg total) by mouth every 6 (six) hours as needed. Must last 30 days.   sertraline  (ZOLOFT ) 50 MG tablet TAKE 1/2 TABLET BY MOUTH TWO TIMES  DAILY   tamsulosin  (FLOMAX ) 0.4 MG CAPS capsule Take 0.4 mg by mouth daily.   Testosterone  10 MG/ACT (2%) GEL Place 10 mg onto the skin daily.   vitamin B-12 (CYANOCOBALAMIN ) 1000 MCG tablet Take 1,000 mcg by mouth daily.   Vitamin D, Ergocalciferol, (DRISDOL) 1.25 MG (50000 UNIT) CAPS capsule TAKE 1 CAPSULE BY MOUTH ONE TIME PER WEEK   vitamin E  400 UNIT capsule Take 400 Units by mouth daily.   [DISCONTINUED] levothyroxine  (SYNTHROID ) 150 MCG tablet Take 1 tablet (150 mcg total) by mouth daily.   No facility-administered medications prior to visit.    Review of Systems  Constitutional: Negative.   HENT: Negative.    Eyes: Negative.   Cardiovascular: Negative.   Gastrointestinal:  Positive for nausea.  Genitourinary: Negative.   Skin: Negative.   Neurological: Negative.   Endo/Heme/Allergies: Negative.   Psychiatric/Behavioral:  Positive for depression and hallucinations. The patient is nervous/anxious.        Objective:   BP 127/80   Pulse 100   Ht 6' (1.829 m)   Wt 197 lb (89.4 kg)   SpO2 96%   BMI 26.72 kg/m   Vitals:   09/12/23 1033  BP: 127/80  Pulse: 100  Height: 6' (1.829 m)  Weight: 197 lb (89.4 kg)  SpO2: 96%  BMI (Calculated): 26.71    Physical Exam Vitals reviewed.  Constitutional:      Appearance: Normal appearance.  HENT:     Head: Normocephalic.     Left Ear: There is no impacted cerumen.     Nose: Nose normal.     Mouth/Throat:     Mouth: Mucous membranes are dry.     Pharynx: No posterior oropharyngeal erythema.  Eyes:     Extraocular Movements: Extraocular movements intact.     Pupils:  Pupils are equal, round, and reactive to light.  Cardiovascular:     Rate and Rhythm: Regular rhythm.     Chest Wall: PMI is not displaced.     Pulses: Normal pulses.     Heart sounds: Normal heart sounds. No murmur heard. Pulmonary:     Effort: Pulmonary effort is normal.     Breath sounds: Normal air entry. No rhonchi or rales.  Abdominal:     General: Abdomen is flat. Bowel sounds are normal. There is no distension.     Palpations: Abdomen is soft. There is no hepatomegaly, splenomegaly or mass.     Tenderness: There is no abdominal tenderness.  Musculoskeletal:        General: Normal range of motion.     Cervical back: Normal range of motion and neck supple.     Right lower leg: No edema.     Left lower leg: No edema.  Skin:    General: Skin is warm and dry.     Findings: Signs of injury (right ankle pressure injury) present.     Comments: Decreased skin turgor  Neurological:     General: No focal deficit present.     Mental Status: He is alert and oriented to person, place, and time.     Cranial Nerves: No cranial nerve deficit.     Motor: No weakness.  Psychiatric:        Mood and Affect: Mood normal.        Behavior: Behavior normal.      Results for orders placed or performed in visit on 09/12/23  POCT CBG (Fasting - Glucose)  Result Value Ref Range   Glucose Fasting,  POC 154 (A) 70 - 99 mg/dL    Recent Results (from the past 2160 hours)  CBC With Diff/Platelet     Status: Abnormal   Collection Time: 09/10/23  8:46 AM  Result Value Ref Range   WBC 7.1 3.4 - 10.8 x10E3/uL   RBC 4.68 4.14 - 5.80 x10E6/uL   Hemoglobin 11.5 (L) 13.0 - 17.7 g/dL   Hematocrit 61.5 62.4 - 51.0 %   MCV 82 79 - 97 fL   MCH 24.6 (L) 26.6 - 33.0 pg   MCHC 29.9 (L) 31.5 - 35.7 g/dL   RDW 85.3 88.3 - 84.5 %   Platelets 166 150 - 450 x10E3/uL   Neutrophils 55 Not Estab. %   Lymphs 27 Not Estab. %   Monocytes 11 Not Estab. %   Eos 6 Not Estab. %   Basos 1 Not Estab. %   Neutrophils  Absolute 3.9 1.4 - 7.0 x10E3/uL   Lymphocytes Absolute 1.9 0.7 - 3.1 x10E3/uL   Monocytes Absolute 0.8 0.1 - 0.9 x10E3/uL   EOS (ABSOLUTE) 0.4 0.0 - 0.4 x10E3/uL   Basophils Absolute 0.1 0.0 - 0.2 x10E3/uL   Immature Granulocytes 0 Not Estab. %   Immature Grans (Abs) 0.0 0.0 - 0.1 x10E3/uL  Comprehensive metabolic panel with GFR     Status: Abnormal   Collection Time: 09/10/23  8:46 AM  Result Value Ref Range   Glucose 132 (H) 70 - 99 mg/dL   BUN 14 8 - 27 mg/dL   Creatinine, Ser 8.78 0.76 - 1.27 mg/dL   eGFR 58 (L) >40 fO/fpw/8.26   BUN/Creatinine Ratio 12 10 - 24   Sodium 141 134 - 144 mmol/L   Potassium 4.6 3.5 - 5.2 mmol/L   Chloride 103 96 - 106 mmol/L   CO2 24 20 - 29 mmol/L   Calcium  8.6 8.6 - 10.2 mg/dL   Total Protein 6.5 6.0 - 8.5 g/dL   Albumin 3.7 3.7 - 4.7 g/dL   Globulin, Total 2.8 1.5 - 4.5 g/dL   Bilirubin Total 0.5 0.0 - 1.2 mg/dL   Alkaline Phosphatase 135 (H) 44 - 121 IU/L   AST 25 0 - 40 IU/L   ALT 15 0 - 44 IU/L  Lipid panel     Status: Abnormal   Collection Time: 09/10/23  8:46 AM  Result Value Ref Range   Cholesterol, Total 86 (L) 100 - 199 mg/dL   Triglycerides 93 0 - 149 mg/dL   HDL 33 (L) >60 mg/dL   VLDL Cholesterol Cal 18 5 - 40 mg/dL   LDL Chol Calc (NIH) 35 0 - 99 mg/dL   Chol/HDL Ratio 2.6 0.0 - 5.0 ratio    Comment:                                   T. Chol/HDL Ratio                                             Men  Women                               1/2 Avg.Risk  3.4    3.3  Avg.Risk  5.0    4.4                                2X Avg.Risk  9.6    7.1                                3X Avg.Risk 23.4   11.0   TSH     Status: None   Collection Time: 09/10/23  8:46 AM  Result Value Ref Range   TSH 0.828 0.450 - 4.500 uIU/mL  POCT CBG (Fasting - Glucose)     Status: Abnormal   Collection Time: 09/12/23 10:50 AM  Result Value Ref Range   Glucose Fasting, POC 154 (A) 70 - 99 mg/dL      Assessment & Plan:   Johntay was seen today for follow-up.  Diabetes mellitus of other type without complication, unspecified whether long term insulin  use (HCC) -     POCT CBG (Fasting - Glucose)  Acquired hypothyroidism -     Levothyroxine  Sodium; Take 1 tablet (150 mcg total) by mouth daily.  Dispense: 90 tablet; Refill: 1 -     TSH  Hallucinations, visual  Mixed hyperlipidemia -     Lipid panel; Standing -     Comprehensive metabolic panel with GFR   Family to request replacement of  Buspar .  Problem List Items Addressed This Visit       Endocrine   Diabetes mellitus (HCC) - Primary   Relevant Orders   POCT CBG (Fasting - Glucose) (Completed)   Hypothyroidism   Relevant Medications   levothyroxine  (SYNTHROID ) 150 MCG tablet   Other Relevant Orders   TSH     Other   Mixed hyperlipidemia   Relevant Orders   Lipid panel   Comprehensive metabolic panel with GFR   Hallucinations, visual    Return in about 3 months (around 12/13/2023) for awv with labs prior.   Total time spent: 20 minutes  Sherrill Cinderella Perry, MD  09/12/2023   This document may have been prepared by [facility] Voice Recognition software and as such may include unintentional dictation errors.

## 2023-09-15 ENCOUNTER — Encounter: Payer: Self-pay | Admitting: Internal Medicine

## 2023-09-15 ENCOUNTER — Other Ambulatory Visit: Payer: Self-pay | Admitting: Internal Medicine

## 2023-09-15 DIAGNOSIS — K2100 Gastro-esophageal reflux disease with esophagitis, without bleeding: Secondary | ICD-10-CM

## 2023-09-16 ENCOUNTER — Other Ambulatory Visit: Payer: Self-pay | Admitting: Urology

## 2023-09-16 DIAGNOSIS — E291 Testicular hypofunction: Secondary | ICD-10-CM

## 2023-09-17 ENCOUNTER — Other Ambulatory Visit: Payer: Self-pay | Admitting: Internal Medicine

## 2023-09-17 DIAGNOSIS — E785 Hyperlipidemia, unspecified: Secondary | ICD-10-CM

## 2023-09-24 ENCOUNTER — Encounter: Payer: Self-pay | Admitting: Urology

## 2023-10-11 DIAGNOSIS — D2261 Melanocytic nevi of right upper limb, including shoulder: Secondary | ICD-10-CM | POA: Diagnosis not present

## 2023-10-11 DIAGNOSIS — Z85828 Personal history of other malignant neoplasm of skin: Secondary | ICD-10-CM | POA: Diagnosis not present

## 2023-10-11 DIAGNOSIS — L570 Actinic keratosis: Secondary | ICD-10-CM | POA: Diagnosis not present

## 2023-10-11 DIAGNOSIS — D2262 Melanocytic nevi of left upper limb, including shoulder: Secondary | ICD-10-CM | POA: Diagnosis not present

## 2023-10-11 DIAGNOSIS — D225 Melanocytic nevi of trunk: Secondary | ICD-10-CM | POA: Diagnosis not present

## 2023-10-11 DIAGNOSIS — L280 Lichen simplex chronicus: Secondary | ICD-10-CM | POA: Diagnosis not present

## 2023-10-11 DIAGNOSIS — D2272 Melanocytic nevi of left lower limb, including hip: Secondary | ICD-10-CM | POA: Diagnosis not present

## 2023-10-18 ENCOUNTER — Other Ambulatory Visit: Payer: Self-pay | Admitting: Cardiovascular Disease

## 2023-10-18 DIAGNOSIS — R0789 Other chest pain: Secondary | ICD-10-CM

## 2023-10-23 ENCOUNTER — Ambulatory Visit (INDEPENDENT_AMBULATORY_CARE_PROVIDER_SITE_OTHER): Admitting: Psychiatry

## 2023-10-23 ENCOUNTER — Other Ambulatory Visit: Payer: Self-pay

## 2023-10-23 ENCOUNTER — Encounter: Payer: Self-pay | Admitting: Psychiatry

## 2023-10-23 VITALS — BP 126/63 | HR 76 | Temp 95.8°F | Ht 72.0 in | Wt 197.0 lb

## 2023-10-23 DIAGNOSIS — R419 Unspecified symptoms and signs involving cognitive functions and awareness: Secondary | ICD-10-CM | POA: Diagnosis not present

## 2023-10-23 DIAGNOSIS — F330 Major depressive disorder, recurrent, mild: Secondary | ICD-10-CM

## 2023-10-23 DIAGNOSIS — H353134 Nonexudative age-related macular degeneration, bilateral, advanced atrophic with subfoveal involvement: Secondary | ICD-10-CM | POA: Insufficient documentation

## 2023-10-23 DIAGNOSIS — H3530 Unspecified macular degeneration: Secondary | ICD-10-CM | POA: Insufficient documentation

## 2023-10-23 DIAGNOSIS — J309 Allergic rhinitis, unspecified: Secondary | ICD-10-CM | POA: Insufficient documentation

## 2023-10-23 DIAGNOSIS — F411 Generalized anxiety disorder: Secondary | ICD-10-CM | POA: Diagnosis not present

## 2023-10-23 DIAGNOSIS — J449 Chronic obstructive pulmonary disease, unspecified: Secondary | ICD-10-CM | POA: Insufficient documentation

## 2023-10-23 DIAGNOSIS — I2510 Atherosclerotic heart disease of native coronary artery without angina pectoris: Secondary | ICD-10-CM | POA: Insufficient documentation

## 2023-10-23 NOTE — Progress Notes (Unsigned)
 [facility] MD OP Progress Note  10/23/2023 3:49 PM Chase Mendez  MRN:  [id number]  Chief Complaint:  Chief Complaint  Patient presents with   Follow-up   Depression   Anxiety   Medication Refill   Discussed the use of AI scribe software for clinical note transcription with the patient, who gave verbal consent to proceed.  History of Present Illness Chase Mendez is an 89 year old Caucasian male, veteran, retired from [employer], lives in [place] with his wife, has a history of MDD, GAD, chronic pain, bereavement, foot drop left-sided, hypothyroidism, hypertension, history of frequent falls, hearing loss was evaluated in office today for a follow-up appointment. He is accompanied by his wife. His primary doctor referred him.  His wife reports that he began experiencing visual hallucinations, including seeing people on top of the house, animals, and groups of people in the backyard. She notes that these hallucinations worsened after he started Buspar .. Since stopping Buspar , his wife describes some improvement in the hallucinations.  He reports that his sleep varies, with some nights of poor sleep, particularly if he naps during the day. He describes feeling depressed about his current condition, stating he never thought he would be in this shape.  He does report anxiety due to his current situational stressors including physical limitations.  He was referred for psychotherapy with in-house therapist however he is not compliant at this time.  He denies any memory problems.  He has macular degeneration and as per wife it is getting worse.  [facility]  Unknown if macular degeneration and vision loss contributing to his visual hallucinations at this time.  Patient currently denies any suicidality, homicidality.    Visit Diagnosis:    ICD-10-CM   1. MDD (major depressive disorder), recurrent episode, mild (HCC)  F33.0     2. GAD (generalized  anxiety disorder)  F41.1     3. Neurocognitive disorder  R41.9    likely mild      Past Psychiatric History: I have psychiatric history from progress note on 09/07/2022.  Past trials of medications like Vraylar ( cost), Remeron , Celexa , risperidone , Cymbalta , Wellbutrin , BuSpar , trazodone .  Past Medical History:  Past Medical History:  Diagnosis Date   Acute encephalopathy 12/08/2014   Anxiety    ARF (acute renal failure) (HCC) 12/08/2014   Back pain    Benign neoplasm of large bowel    Capsulitis    fractured displaced metatarsal with capsulitis   Chronic back pain    Coronavirus infection    Depression    Diabetes mellitus without complication (HCC)    no medications currently   Dysphagia    Exostosis    painful, right hallux   Foot drop, left    wears a brace   Frequent falls 02/2018   poor balance, foot drop   GERD (gastroesophageal reflux disease)    Gout    Hypertension    Hypothyroidism    Insomnia    Low testosterone     Microscopic hematuria 2016   Myocardial infarction [facility])    patient unaware when it happened years ago.     Pneumonia 12/07/2014   Pressure ulcer 12/09/2014   Sepsis (HCC) 12/08/2014   Thyroid  disease     Past Surgical History:  Procedure Laterality Date   APPENDECTOMY     BACK SURGERY  1995, 1996   x 2   CHOLECYSTECTOMY     ESOPHAGOGASTRODUODENOSCOPY (EGD) WITH PROPOFOL  N/A 04/24/2017   Procedure: ESOPHAGOGASTRODUODENOSCOPY (  EGD) WITH PROPOFOL ;  Surgeon: Viktoria Lamar DASEN, MD;  Location: [facility] ENDOSCOPY;  Service: Endoscopy;  Laterality: N/A;   EYE SURGERY Bilateral 1983, 1985   cataract extractions   SPINAL CORD STIMULATOR INSERTION N/A 03/13/2018   Procedure: SPINAL CORD STIMULATOR INSERTION;  Surgeon: Clois Fret, MD;  Location: [facility] ORS;  Service: Neurosurgery;  Laterality: N/A;    Family Psychiatric History: I have reviewed family psychiatric history from progress note on 09/07/2022.  Family History:  Family History  Problem  Relation Age of Onset   Heart disease Mother    Diabetes Father    Kidney cancer Brother     Social History: I have reviewed social history from progress note on 09/07/2022. Social History   Socioeconomic History   Marital status: Married    Spouse name: dorothy   Number of children: 2   Years of education: Not on file   Highest education level: Some college, no degree  Occupational History   Occupation: maintenance / repair    Comment: disabled  Tobacco Use   Smoking status: Every Day    Types: Cigars   Smokeless tobacco: Never   Tobacco comments:    unable to give cessation materials due to webex visit   Vaping Use   Vaping status: Never Used  Substance and Sexual Activity   Alcohol use: No   Drug use: Yes    Types: Oxycodone    Sexual activity: Not Currently  Other Topics Concern   Not on file  Social History Narrative   Not on file   Social Drivers of Health   Financial Resource Strain: Low Risk  (01/22/2023)   Received from [facility]   Overall Financial Resource Strain (CARDIA)    Difficulty of Paying Living Expenses: Not hard at all  Food Insecurity: No Food Insecurity (01/22/2023)   Received from [facility]   Hunger Vital Sign    Within the past 12 months, you worried that your food would run out before you got the money to buy more.: Never true    Within the past 12 months, the food you bought just didn't last and you didn't have money to get more.: Never true  Transportation Needs: No Transportation Needs (07/17/2022)   PRAPARE - Administrator, Civil Service (Medical): No    Lack of Transportation (Non-Medical): No  Physical Activity: Not on file  Stress: Not on file  Social Connections: Not on file    Allergies:  Allergies  Allergen Reactions   Wellbutrin  [Bupropion ]     hallucinations   Doxycycline Nausea Only   Ibuprofen Nausea Only   Sulfa Antibiotics Nausea Only    Metabolic Disorder  Labs: Lab Results  Component Value Date   HGBA1C 5.6 07/18/2022   MPG 114.02 07/18/2022   No results found for: PROLACTIN Lab Results  Component Value Date   CHOL 86 (L) 09/10/2023   TRIG 93 09/10/2023   HDL 33 (L) 09/10/2023   CHOLHDL 2.6 09/10/2023   VLDL 23 12/08/2014   LDLCALC 35 09/10/2023   LDLCALC 37 06/07/2023   Lab Results  Component Value Date   TSH 0.828 09/10/2023   TSH 0.906 06/07/2023    Therapeutic Level Labs: No results found for: LITHIUM No results found for: VALPROATE No results found for: CBMZ  Current Medications: Current Outpatient Medications  Medication Sig Dispense Refill   acetaminophen  (TYLENOL ) 500 MG tablet Take 500-1,000 mg by mouth 2 (two) times daily as needed for mild  pain.     allopurinol  (ZYLOPRIM ) 100 MG tablet TAKE 1 TABLET BY MOUTH EVERY OTHER DAY 45 tablet 1   amLODipine  (NORVASC ) 5 MG tablet Take 2.5 mg by mouth daily.     aspirin  81 MG tablet Take 81 mg by mouth daily.     atorvastatin  (LIPITOR) 10 MG tablet TAKE 1 TABLET BY MOUTH EVERYDAY AT BEDTIME 90 tablet 1   Cholecalciferol 50 MCG (2000 UT) TABS Take by mouth.     famotidine  (PEPCID ) 20 MG tablet TAKE 1 TABLET BY MOUTH TWICE A DAY 180 tablet 1   fluticasone  (FLONASE ) 50 MCG/ACT nasal spray Place 1-2 sprays into both nostrils daily as needed for allergies or rhinitis.     gabapentin  (NEURONTIN ) 600 MG tablet TAKE 1 TABLET BY MOUTH TWICE DAILY. 180 tablet 1   hydrOXYzine  (VISTARIL ) 25 MG capsule Take 1 capsule (25 mg total) by mouth daily as needed for anxiety. 30 capsule 5   isosorbide  mononitrate (IMDUR ) 30 MG 24 hr tablet TAKE 1 TABLET BY MOUTH EVERY DAY 90 tablet 0   levothyroxine  (SYNTHROID ) 150 MCG tablet Take 1 tablet (150 mcg total) by mouth daily. 90 tablet 1   losartan  (COZAAR ) 100 MG tablet TAKE 1 TABLET BY MOUTH EVERY DAY 90 tablet 1   mirabegron  ER (MYRBETRIQ ) 50 MG TB24 tablet TAKE ONE TABLET BY MOUTH ONE TIME DAILY 30 tablet 11   naloxone  (NARCAN ) nasal  spray 4 mg/0.1 mL Place 1 spray into the nose as needed for up to 365 doses (for opioid-induced respiratory depresssion). In case of emergency (overdose), spray once into each nostril. If no response within 3 minutes, repeat application and call 911. 1 each 0   ondansetron  (ZOFRAN -ODT) 4 MG disintegrating tablet TAKE 1 TABLET (4 MG TOTAL) BY MOUTH IN THE MORNING, AT NOON, IN THE EVENING, AND AT BEDTIME. 90 tablet 3   Oxycodone  HCl 10 MG TABS Take 1 tablet (10 mg total) by mouth every 6 (six) hours as needed. Must last 30 days. 120 tablet 0   Oxycodone  HCl 10 MG TABS Take 1 tablet (10 mg total) by mouth every 6 (six) hours as needed. Must last 30 days. 120 tablet 0   [START ON 11/04/2023] Oxycodone  HCl 10 MG TABS Take 1 tablet (10 mg total) by mouth every 6 (six) hours as needed. Must last 30 days. 120 tablet 0   sertraline  (ZOLOFT ) 50 MG tablet TAKE 1/2 TABLET BY MOUTH TWO TIMES DAILY 30 tablet 5   tamsulosin  (FLOMAX ) 0.4 MG CAPS capsule Take 0.4 mg by mouth daily.     Testosterone  10 MG/ACT (2%) GEL PLACE 10 MG ONTO THE SKIN DAILY. 60 g 1   vitamin B-12 (CYANOCOBALAMIN ) 1000 MCG tablet Take 1,000 mcg by mouth daily.     Vitamin D, Ergocalciferol, (DRISDOL) 1.25 MG (50000 UNIT) CAPS capsule TAKE 1 CAPSULE BY MOUTH ONE TIME PER WEEK 12 capsule 3   vitamin E  400 UNIT capsule Take 400 Units by mouth daily.     No current facility-administered medications for this visit.     Musculoskeletal: Strength & Muscle Tone: within normal limits Gait & Station: walks with walker Patient leans: N/A  Psychiatric Specialty Exam: Review of Systems  Psychiatric/Behavioral:  Positive for hallucinations. The patient is nervous/anxious.     Blood pressure 126/63, pulse 76, temperature (!) 95.8 F (35.4 C), temperature source Temporal, height 6' (1.829 m), weight 197 lb (89.4 kg).Body mass index is 26.72 kg/m.  General Appearance: Casual  Eye Contact:  Fair  Speech:  Clear and Coherent  Volume:  Normal   Mood:  Anxious  Affect:  Congruent  Thought Process:  Goal Directed and Descriptions of Associations: Intact  Orientation:  Full (Time, Place, and Person)  Thought Content: Hallucinations: Visual  improving , able to cope  Suicidal Thoughts:  No  Homicidal Thoughts:  No  Memory:  Immediate;   Fair Recent;   Fair Remote;   Poor  Judgement:  Fair  Insight:  Fair  Psychomotor Activity:  Normal  Concentration:  Concentration: Fair and Attention Span: Fair  Recall:  [employer] of Knowledge: Fair  Language: Fair  Akathisia:  No  Handed:  Right  AIMS (if indicated): not done  Assets:  Manufacturing systems engineer Desire for Improvement Housing Social Support Transportation  ADL's:  Intact  Cognition: WNL  Sleep:  Fair   Screenings: GAD-7    Garment/textile technologist Visit from 08/16/2023 in [facility] Counselor from 05/15/2023 in [facility] Office Visit from 01/24/2023 in [facility] Counselor from 12/11/2022 in [facility] Office Visit from 12/05/2022 in [facility]  Total GAD-7 Score 9 0 11 18 10    PHQ2-9    Flowsheet Row Office Visit from 08/29/2023 in [facility] Visit from 08/16/2023 in [facility] Office Visit from 05/30/2023 in [facility] Counselor from 05/15/2023 in [facility] Office Visit from 01/24/2023 in [facility]  PHQ-2 Total Score 0 3 0 0 5  PHQ-9 Total Score -- 6 -- 0 12   Flowsheet Row Office Visit from 10/23/2023 in [facility] Office Visit from 08/16/2023 in [facility]  Counselor from 05/15/2023 in [facility]  C-SSRS RISK CATEGORY No Risk No Risk No Risk     Assessment and Plan: GENEVIEVE RITZEL is a 89 year old Caucasian male who has a history of depression, neurocognitive disorder, anxiety was evaluated in office today.  Discussed assessment and plan as noted below.   1. MDD (major depressive disorder), recurrent episode, mild (HCC)-unstable Currently reports depression symptoms mostly because of his comorbid physical limitations including vision loss due to macular degeneration.  Patient with visual hallucinations likely due to worsening vision loss from his macular degeneration.  Agrees to discuss this with ophthalmologist.  Has upcoming appointment.  Will hold off starting an antipsychotic medication at this time since visual hallucinations are manageable and it does not distressing at this time.  Stopping the BuSpar  also may have helped. Continue Sertraline  50 mg daily in divided dosage. Will consider dosage adjustment in the future. Patient declines referral for psychotherapy.  2. GAD (generalized anxiety disorder)-unstable Ongoing anxiety mostly due to situational stressors.  Not interested in any medication changes at this time.  BuSpar  was recently stopped due to side effects. Continue hydroxyzine  25 mg daily as needed Continue sertraline  50 mg daily in divided dosage   3. Neurocognitive disorder-chronic likely mild Cognitive function assessed today in session, no significant changes.  Will monitor closely Consider getting a CT scan/MRI of brain in the future. Consider neurology follow-up at the [facility].  Collateral information obtained from wife who was present in session as noted above.  Follow-up Follow-up in clinic in 1 month or sooner if needed.  Collaboration  of Care: Collaboration of Care: Other previously referred for therapy with Ms. Evalene Husband.  Patient to continue it as needed.  Patient  advised to follow-up with ophthalmology for blindness/macular degeneration which possibly is contributing to visual hallucinations.  Patient/Guardian was advised Release of Information must be obtained prior to any record release in order to collaborate their care with an outside provider. Patient/Guardian was advised if they have not already done so to contact the registration department to sign all necessary forms in order for us  to release information regarding their care.   Consent: Patient/Guardian gives verbal consent for treatment and assignment of benefits for services provided during this visit. Patient/Guardian expressed understanding and agreed to proceed.   This note was generated in part or whole with voice recognition software. Voice recognition is usually quite accurate but there are transcription errors that can and very often do occur. I apologize for any typographical errors that were not detected and corrected.    Taurean Ju, MD 10/23/2023, 3:49 PM

## 2023-10-31 DIAGNOSIS — M79674 Pain in right toe(s): Secondary | ICD-10-CM | POA: Diagnosis not present

## 2023-10-31 DIAGNOSIS — M79675 Pain in left toe(s): Secondary | ICD-10-CM | POA: Diagnosis not present

## 2023-10-31 DIAGNOSIS — B351 Tinea unguium: Secondary | ICD-10-CM | POA: Diagnosis not present

## 2023-11-03 ENCOUNTER — Other Ambulatory Visit: Payer: Self-pay | Admitting: Nurse Practitioner

## 2023-11-03 DIAGNOSIS — Z79891 Long term (current) use of opiate analgesic: Secondary | ICD-10-CM

## 2023-11-03 DIAGNOSIS — Z79899 Other long term (current) drug therapy: Secondary | ICD-10-CM

## 2023-11-03 DIAGNOSIS — G8929 Other chronic pain: Secondary | ICD-10-CM

## 2023-11-03 DIAGNOSIS — M5417 Radiculopathy, lumbosacral region: Secondary | ICD-10-CM

## 2023-11-03 DIAGNOSIS — M961 Postlaminectomy syndrome, not elsewhere classified: Secondary | ICD-10-CM

## 2023-11-03 DIAGNOSIS — G894 Chronic pain syndrome: Secondary | ICD-10-CM

## 2023-11-09 ENCOUNTER — Encounter: Payer: Self-pay | Admitting: Urology

## 2023-11-19 ENCOUNTER — Other Ambulatory Visit

## 2023-11-19 ENCOUNTER — Other Ambulatory Visit: Payer: Self-pay

## 2023-11-19 DIAGNOSIS — E291 Testicular hypofunction: Secondary | ICD-10-CM

## 2023-11-20 ENCOUNTER — Ambulatory Visit: Payer: Self-pay | Admitting: Urology

## 2023-11-20 LAB — TESTOSTERONE: Testosterone: 32 ng/dL — ABNORMAL LOW (ref 264–916)

## 2023-11-20 LAB — PSA: Prostate Specific Ag, Serum: 0.2 ng/mL (ref 0.0–4.0)

## 2023-11-26 ENCOUNTER — Encounter: Payer: Self-pay | Admitting: Nurse Practitioner

## 2023-11-26 ENCOUNTER — Ambulatory Visit: Payer: PRIVATE HEALTH INSURANCE | Attending: Nurse Practitioner | Admitting: Nurse Practitioner

## 2023-11-26 DIAGNOSIS — G8929 Other chronic pain: Secondary | ICD-10-CM | POA: Insufficient documentation

## 2023-11-26 DIAGNOSIS — Z79891 Long term (current) use of opiate analgesic: Secondary | ICD-10-CM | POA: Insufficient documentation

## 2023-11-26 DIAGNOSIS — M5417 Radiculopathy, lumbosacral region: Secondary | ICD-10-CM | POA: Insufficient documentation

## 2023-11-26 DIAGNOSIS — M79605 Pain in left leg: Secondary | ICD-10-CM | POA: Diagnosis not present

## 2023-11-26 DIAGNOSIS — M961 Postlaminectomy syndrome, not elsewhere classified: Secondary | ICD-10-CM | POA: Diagnosis not present

## 2023-11-26 DIAGNOSIS — Z79899 Other long term (current) drug therapy: Secondary | ICD-10-CM | POA: Insufficient documentation

## 2023-11-26 DIAGNOSIS — M5442 Lumbago with sciatica, left side: Secondary | ICD-10-CM | POA: Insufficient documentation

## 2023-11-26 DIAGNOSIS — G894 Chronic pain syndrome: Secondary | ICD-10-CM | POA: Diagnosis not present

## 2023-11-26 MED ORDER — OXYCODONE HCL 10 MG PO TABS: 10.0000 mg | ORAL_TABLET | Freq: Four times a day (QID) | ORAL | 0 refills | Status: DC | PRN

## 2023-11-26 NOTE — Progress Notes (Signed)
 Nursing Pain Medication Assessment:  Safety precautions to be maintained throughout the outpatient stay will include: orient to surroundings, keep bed in low position, maintain call bell within reach at all times, provide assistance with transfer out of bed and ambulation.  Medication Inspection Compliance: Pill count conducted under aseptic conditions, in front of the patient. Neither the pills nor the bottle was removed from the patient's sight at any time. Once count was completed pills were immediately returned to the patient in their original bottle.  Medication: Oxycodone  IR Pill/Patch Count: 36 of 120 pills/patches remain Pill/Patch Appearance: Markings consistent with prescribed medication Bottle Appearance: Standard pharmacy container. Clearly labeled. Filled Date: 09 / 27 / 2025 Last Medication intake:  Today

## 2023-11-26 NOTE — Patient Instructions (Signed)
Medication Rules  Applies to: All patients receiving prescriptions (written or electronic).  Pharmacy of record: Pharmacy where electronic prescriptions will be sent. If written prescriptions are taken to a different pharmacy, please inform the nursing staff. The pharmacy listed in the electronic medical record should be the one where you would like electronic prescriptions to be sent.  Prescription refills: Only during scheduled appointments. Applies to both, written and electronic prescriptions.  NOTE: The following applies primarily to controlled substances (Opioid* Pain Medications).   Patient's responsibilities: Pain Pills: Bring all pain pills to every appointment (except for procedure appointments). Pill Bottles: Bring pills in original pharmacy bottle. Always bring newest bottle. Bring bottle, even if empty. Medication refills: You are responsible for knowing and keeping track of what medications you need refilled. The day before your appointment, write a list of all prescriptions that need to be refilled. Bring that list to your appointment and give it to the admitting nurse. Prescriptions will be written only during appointments. If you forget a medication, it will not be "Called in", "Faxed", or "electronically sent". You will need to get another appointment to get these prescribed. Prescription Accuracy: You are responsible for carefully inspecting your prescriptions before leaving our office. Have the discharge nurse carefully go over each prescription with you, before taking them home. Make sure that your name is accurately spelled, that your address is correct. Check the name and dose of your medication to make sure it is accurate. Check the number of pills, and the written instructions to make sure they are clear and accurate. Make sure that you are given enough medication to last until your next medication refill appointment. Taking Medication: Take medication as prescribed. Never  take more pills than instructed. Never take medication more frequently than prescribed. Taking less pills or less frequently is permitted and encouraged, when it comes to controlled substances (written prescriptions).  Inform other Doctors: Always inform, all of your healthcare providers, of all the medications you take. Pain Medication from other Providers: You are not allowed to accept any additional pain medication from any other Doctor or Healthcare provider. There are two exceptions to this rule. (see below) In the event that you require additional pain medication, you are responsible for notifying us, as stated below. Medication Agreement: You are responsible for carefully reading and following our Medication Agreement. This must be signed before receiving any prescriptions from our practice. Safely store a copy of your signed Agreement. Violations to the Agreement will result in no further prescriptions. (Additional copies of our Medication Agreement are available upon request.) Laws, Rules, & Regulations: All patients are expected to follow all Federal and State Laws, Statutes, Rules, & Regulations. Ignorance of the Laws does not constitute a valid excuse. The use of any illegal substances is prohibited. Adopted CDC guidelines & recommendations: Target dosing levels will be at or below 60 MME/day. Use of benzodiazepines** is not recommended.  Exceptions: There are only two exceptions to the rule of not receiving pain medications from other Healthcare Providers. Exception #1 (Emergencies): In the event of an emergency (i.e.: accident requiring emergency care), you are allowed to receive additional pain medication. However, you are responsible for: As soon as you are able, call our office [phone], at any time of the day or night, and leave a message stating your name, the date and nature of the emergency, and the name and dose of the medication prescribed. In the event that your call is answered  by a member of   our staff, make sure to document and save the date, time, and the name of the person that took your information.  Exception #2 (Planned Surgery): In the event that you are scheduled by another doctor or dentist to have any type of surgery or procedure, you are allowed (for a period no longer than 30 days), to receive additional pain medication, for the acute post-op pain. However, in this case, you are responsible for picking up a copy of our "Post-op Pain Management for Surgeons" handout, and giving it to your surgeon or dentist. This document is available at our office, and does not require an appointment to obtain it. Simply go to our office during business hours (Monday-Thursday from 8:00 AM to 4:00 PM) (Friday 8:00 AM to 12:00 Noon) or if you have a scheduled appointment with us, prior to your surgery, and ask for it by name. In addition, you will need to provide us with your name, name of your surgeon, type of surgery, and date of procedure or surgery.  *Opioid medications include: morphine, codeine, oxycodone, oxymorphone, hydrocodone, hydromorphone, meperidine, tramadol, tapentadol, buprenorphine, fentanyl, methadone. **Benzodiazepine medications include: diazepam (Valium), alprazolam (Xanax), clonazepam (Klonopine), lorazepam (Ativan), clorazepate (Tranxene), chlordiazepoxide (Librium), estazolam (Prosom), oxazepam (Serax), temazepam (Restoril), triazolam (Halcion) (Last updated: 04/05/2017)  

## 2023-11-26 NOTE — Progress Notes (Signed)
 PROVIDER NOTE: Interpretation of information contained herein should be left to medically-trained personnel. Specific patient instructions are provided elsewhere under Patient Instructions section of medical record. This document was created in part using AI and STT-dictation technology, any transcriptional errors that may result from this process are unintentional.  Patient: Chase Mendez  Service: E/M   PCP: Chase GORMAN Dine, MD  DOB: 1935-01-05  DOS: 11/26/2023  Provider: Emmy MARLA Blanch, NP  MRN: [id number]  Delivery: Face-to-face  Specialty: Interventional Pain Management  Type: Established Patient  Setting: Ambulatory outpatient facility  Specialty designation: 09  Referring Prov.: Chase GORMAN Dine, MD  Location: Outpatient office facility       History of present illness (HPI) Chase Mendez, a 89 y.o. year old male, is here today because of his hip pain. Chase Mendez primary complain today is Hip Pain  Pertinent problems: Chase Mendez  has Spinal accessory neuropathy; Lower limb pain, inferior (L5) (Foot Drop) (Left); Chronic low back pain (2ry area of pain) (Bilateral) (ML) (L>R)w/ sciatica (Left); Chronic lower extremity pain (1ry area of pain) (Left); Failed back surgical syndrome; Lumbosacral radiculopathy at L5 (Left); DDD (degenerative disc disease, lumbar; and Chronic pain syndrome on their pertinent problem list.  Pain Assessment: Severity of Chronic pain is reported as a 6 /10. Location: Hip Left/Down to Left Foot. Onset: More than a month ago. Quality: Sharp, Stabbing. Timing: Intermittent. Modifying factor(s): Laying down. Vitals:  height is 6' 3 (1.905 m) and weight is 196 lb (88.9 kg). His temporal temperature is 97.2 F (36.2 C) (abnormal). His blood pressure is 120/59 (abnormal) and his pulse is 66. His respiration is 18 and oxygen saturation is 94%.  BMI: Estimated body mass index is 24.5 kg/m as calculated from the following:   Height as of this encounter: 6' 3 (1.905  m).   Weight as of this encounter: 196 lb (88.9 kg).  Last encounter: 08/29/2023. Last procedure: Visit date not found.  Reason for encounter: medication management. The patient indicates doing well with the current medication regimen. No side effects or any adverse reaction reported to the medication.   Discussed the use of AI scribe software for clinical note transcription with the patient, who gave verbal consent to proceed.  History of Present Illness   Chase Mendez is an 89 year old male who presents with medication-related hallucinations.  He experiences hallucinations, particularly in the mornings and around lunchtime, which he suspects may be related to his medication regimen. His current medications include oxycodone , taken at 10 AM, 4 PM, 10 PM, and 4 AM, and Zoloft  25 mg twice daily, once in the morning and once at bedtime. No side effects from oxycodone  are reported.  He has a history of severe neck pain due to a bone spur, for which he was previously hospitalized and treated with Valium  and oxycodone . He continues to use oxycodone  for pain management.  He is also on Vistaril , which he has been taking for many years to help with sleep and nerves. A previous medication was discontinued by his doctor as it did not make a difference in his symptoms.  He is experiencing difficulty with his spinal cord stimulator, which is not charging properly. He has contacted [employer] for assistance but has not yet received a response.  The patient was advised to contact the [employer] representative again, and if he does not receive a response, we will call the hearing office to assist with the device adjustment for support.  Pharmacotherapy Assessment   Oxycodone  HCl 10 mg tablets every 6 hours as needed for pain. MME=60  Monitoring: [place] PMP: PDMP reviewed during this encounter.       Pharmacotherapy: No side-effects or adverse reactions reported. Compliance: No problems  identified. Effectiveness: Clinically acceptable.  Chase Mendez, Chase Mendez  11/26/2023 11:33 AM  Sign when Signing Visit Nursing Pain Medication Assessment:  Safety precautions to be maintained throughout the outpatient stay will include: orient to surroundings, keep bed in low position, maintain call bell within reach at all times, provide assistance with transfer out of bed and ambulation.  Medication Inspection Compliance: Pill count conducted under aseptic conditions, in front of the patient. Neither the pills nor the bottle was removed from the patient's sight at any time. Once count was completed pills were immediately returned to the patient in their original bottle.  Medication: Oxycodone  IR Pill/Patch Count: 36 of 120 pills/patches remain Pill/Patch Appearance: Markings consistent with prescribed medication Bottle Appearance: Standard pharmacy container. Clearly labeled. Filled Date: 09 / 27 / 2025 Last Medication intake:  Today    UDS:  Summary  Date Value Ref Range Status  09/05/2022 Note  Final    Comment:    ==================================================================== [employer] [facility] (MW) ==================================================================== Test                             Result       Flag       Units  Drug Present and Declared for Prescription Verification   Oxycodone                       3918         EXPECTED   ng/mg creat   Oxymorphone                    979          EXPECTED   ng/mg creat   Noroxycodone                   7697         EXPECTED   ng/mg creat   Noroxymorphone                 868          EXPECTED   ng/mg creat    Sources of oxycodone  are scheduled prescription medications.    Oxymorphone, noroxycodone, and noroxymorphone are expected    metabolites of oxycodone . Oxymorphone is also available as a    scheduled prescription medication.  ==================================================================== Test                       Result    Flag   Units      Ref Range   Creatinine              34               mg/dL      >=79 ==================================================================== Declared Medications:  The flagging and interpretation on this report are based on the  following declared medications.  Unexpected results may arise from  inaccuracies in the declared medications.   **Note: The testing scope of this panel includes these medications:   Oxycodone    **Note: The testing scope of this panel does not include the  following reported medications:   Acetaminophen  (Tylenol )  Allopurinol   Amlodipine   Aspirin   Atorvastatin   Diphenhydramine (Benadryl)  Famotidine   Fluticasone  (Flonase )  Gabapentin  (Neurontin )  Isosorbide  (Imdur )  Levothyroxine   Losartan   Mirabegron  (Myrbetriq )  Naloxone  (Narcan )  Ondansetron   Sertraline  (Zoloft )  Tamsulosin  (Flomax )  Testosterone   Vitamin B12  Vitamin D2 (Drisdol)  Vitamin E  ==================================================================== For clinical consultation, please call [phone]. ====================================================================     No results found for: CBDTHCR No results found for: D8THCCBX No results found for: D9THCCBX  ROS  Constitutional: Denies any fever or chills Gastrointestinal: No reported hemesis, hematochezia, vomiting, or acute GI distress Musculoskeletal: Hip pain (bilateral) Neurological: No reported episodes of acute onset apraxia, aphasia, dysarthria, agnosia, amnesia, paralysis, loss of coordination, or loss of consciousness  Medication Review  Cholecalciferol, Oxycodone  HCl, Vitamin D (Ergocalciferol), acetaminophen , allopurinol , amLODipine , aspirin , atorvastatin , cyanocobalamin , diphenhydrAMINE, famotidine , fluticasone , gabapentin , hydrOXYzine , isosorbide  mononitrate, levothyroxine , losartan , mirabegron  ER, naloxone , ondansetron , sertraline , tamsulosin , and vitamin E   History  Review  Allergy: Chase Mendez is allergic to wellbutrin  [bupropion ], doxycycline, ibuprofen, and sulfa antibiotics. Drug: Chase Mendez  reports current drug use. Drug: Oxycodone . Alcohol:  reports no history of alcohol use. Tobacco:  reports that he has been smoking cigars. He has never used smokeless tobacco. Social: Chase Mendez  reports that he has been smoking cigars. He has never used smokeless tobacco. He reports current drug use. Drug: Oxycodone . He reports that he does not drink alcohol. Medical:  has a past medical history of Acute encephalopathy (12/08/2014), Anxiety, ARF (acute renal failure) (12/08/2014), Back pain, Benign neoplasm of large bowel, Capsulitis, Chronic back pain, Coronavirus infection, Depression, Diabetes mellitus without complication (HCC), Dysphagia, Exostosis, Foot drop, left, Frequent falls (02/2018), GERD (gastroesophageal reflux disease), Gout, Hypertension, Hypothyroidism, Insomnia, Low testosterone , Microscopic hematuria (2016), Myocardial infarction (HCC), Pneumonia (12/07/2014), Pressure ulcer (12/09/2014), Sepsis (HCC) (12/08/2014), and Thyroid  disease. Surgical: Chase Mendez  has a past surgical history that includes Cholecystectomy; Esophagogastroduodenoscopy (egd) with propofol  (N/A, 04/24/2017); Back surgery (1995, 1996); Eye surgery (Bilateral, 1983, 1985); Appendectomy; and Spinal cord stimulator insertion (N/A, 03/13/2018). Family: family history includes Diabetes in his father; Heart disease in his mother; Kidney cancer in his brother.  Laboratory Chemistry Profile   Renal Lab Results  Component Value Date   BUN 14 09/10/2023   CREATININE 1.21 09/10/2023   BCR 12 09/10/2023   GFRAA >60 01/28/2019   GFRNONAA >60 07/19/2022    Hepatic Lab Results  Component Value Date   AST 25 09/10/2023   ALT 15 09/10/2023   ALBUMIN 3.7 09/10/2023   ALKPHOS 135 (H) 09/10/2023   HCVAB <0.1 07/24/2018   LIPASE 37 08/21/2016    Electrolytes Lab Results  Component Value  Date   NA 141 09/10/2023   K 4.6 09/10/2023   CL 103 09/10/2023   CALCIUM  8.6 09/10/2023   MG 2.2 07/18/2022   PHOS 2.6 07/19/2022    Bone Lab Results  Component Value Date   TESTOSTERONE  32 (L) 11/19/2023    Inflammation (CRP: Acute Phase) (ESR: Chronic Phase) Lab Results  Component Value Date   CRP 6.6 (H) 07/19/2022   LATICACIDVEN 1.7 07/17/2022         Note: Above Lab results reviewed.  Recent Imaging Review  US  Venous Img Lower Bilateral (DVT) CLINICAL DATA:  13689 Swelling 13689  EXAM: BILATERAL LOWER EXTREMITY VENOUS DOPPLER ULTRASOUND  TECHNIQUE: Gray-scale sonography with graded compression, as well as color Doppler and duplex ultrasound were performed to evaluate the lower extremity deep venous systems from the level of the common femoral vein and including the common femoral, femoral, profunda femoral, popliteal and calf  veins including the posterior tibial, peroneal and gastrocnemius veins when visible. The superficial great saphenous vein was also interrogated. Spectral Doppler was utilized to evaluate flow at rest and with distal augmentation maneuvers in the common femoral, femoral and popliteal veins.  COMPARISON:  None Available.  FINDINGS: RIGHT LOWER EXTREMITY  Common Femoral Vein: No evidence of thrombus. Normal compressibility, respiratory phasicity and response to augmentation.  Saphenofemoral Junction: No evidence of thrombus. Normal compressibility and flow on color Doppler imaging.  Profunda Femoral Vein: No evidence of thrombus. Normal compressibility and flow on color Doppler imaging.  Femoral Vein: No evidence of thrombus. Normal compressibility, respiratory phasicity and response to augmentation.  Popliteal Vein: No evidence of thrombus. Normal compressibility, respiratory phasicity and response to augmentation.  Calf Veins: Calf veins were poorly visualized on grayscale imaging, but were without evidence of thrombus on color  Doppler evaluation  Superficial Great Saphenous Vein: No evidence of thrombus. Normal compressibility.  Venous Reflux:  None.  Other Findings:  None.  LEFT LOWER EXTREMITY  Common Femoral Vein: No evidence of thrombus. Normal compressibility, respiratory phasicity and response to augmentation.  Saphenofemoral Junction: No evidence of thrombus. Normal compressibility and flow on color Doppler imaging.  Profunda Femoral Vein: No evidence of thrombus. Normal compressibility and flow on color Doppler imaging.  Femoral Vein: No evidence of thrombus. Normal compressibility, respiratory phasicity and response to augmentation.  Popliteal Vein: No evidence of thrombus. Normal compressibility, respiratory phasicity and response to augmentation.  Calf Veins: Calf veins were poorly visualized on grayscale imaging, but were without evidence of thrombus on color Doppler evaluation  Superficial Great Saphenous Vein: No evidence of thrombus. Normal compressibility.  Venous Reflux:  None.  Other Findings: Soft tissue swelling in the left-greater-than-right lower extremity.  IMPRESSION: No evidence of deep venous thrombosis in either lower extremity.  Electronically Signed   By: Lyndall Gore M.D.   On: 07/17/2022 15:43 CT Angio Chest PE W/Cm &/Or Wo Cm CLINICAL DATA:  Pulmonary embolism suspected, high probability  EXAM: CT ANGIOGRAPHY CHEST WITH CONTRAST  TECHNIQUE: Multidetector CT imaging of the chest was performed using the standard protocol during bolus administration of intravenous contrast. Multiplanar CT image reconstructions and MIPs were obtained to evaluate the vascular anatomy.  RADIATION DOSE REDUCTION: This exam was performed according to the departmental dose-optimization program which includes automated exposure control, adjustment of the mA and/or kV according to patient size and/or use of iterative reconstruction technique.  CONTRAST:  50mL OMNIPAQUE   IOHEXOL  350 MG/ML SOLN  COMPARISON:  None Available.  FINDINGS: Cardiovascular: Conventional 3 vessel arch anatomy. Atherosclerotic calcifications throughout the aorta and along the coronary arteries. Mild cardiomegaly. The pulmonary arteries are well opacified to the proximal segmental level. No evidence of acute pulmonary embolus. No pericardial effusion.  Mediastinum/Nodes: Unremarkable CT appearance of the thyroid  gland. No suspicious mediastinal or hilar adenopathy. No soft tissue mediastinal mass. Small hiatal hernia.  Lungs/Pleura: Multifocal ground-glass attenuation airspace opacities and small nodules throughout the right upper lobe in a peribronchovascular distribution. Diffuse right lower lobe bronchial wall thickening with a combination of peribronchovascular patchy airspace opacities and volume loss. Similar but less impressive findings in the left lower lobe. Mild background of centrilobular pulmonary emphysema. No pleural effusion or pneumothorax.  Upper Abdomen: Submucosal edema at the hepatic flexure and proximal ascending colon. Inflammatory stranding is present in the pericolonic fat. The liver demonstrates a cirrhotic morphology with faint nodularity and hypertrophy of the left lobe relative to the right. The gallbladder is surgically absent.  Musculoskeletal: No chest wall abnormality. No acute or significant osseous findings. Epidural spinal stimulator in place.  Review of the MIP images confirms the above findings.  IMPRESSION: 1. CT findings are most consistent with multi lobar pneumonia involving the right upper, right lower and, to a lesser extent, the left lower lobes. 2. Negative for acute pulmonary embolus. 3. Additionally, the visualized hepatic flexure and proximal transverse colon are abnormal with diffuse wall thickening and pericolonic inflammatory stranding. Findings suggest active infectious/inflammatory colitis. 4. Aortic and coronary  artery atherosclerotic vascular calcifications. 5. Mild centrilobular pulmonary emphysema. 6. Small hiatal hernia.  Aortic Atherosclerosis (ICD10-I70.0) and Emphysema (ICD10-J43.9).  Electronically Signed   By: Wilkie Lent M.D.   On: 07/17/2022 08:19 DG Chest Port 1 View CLINICAL DATA:  Questionable sepsis  EXAM: PORTABLE CHEST 1 VIEW  COMPARISON:  01/27/2019  FINDINGS: Low volume chest with interstitial crowding at the bases. No edema, effusion, or pneumothorax. Normal heart size and mediastinal contours for technique. Spinal cord stimulator the lower thoracic level.  IMPRESSION: Low volume chest with atelectatic type density at the bases. Early bronchopneumonia is not excluded.  Electronically Signed   By: Dorn Roulette M.D.   On: 07/17/2022 05:50 Note: Reviewed        Physical Exam  Vitals: BP (!) 120/59 (BP Location: Left Arm, Patient Position: Sitting, Cuff Size: Normal)   Pulse 66   Temp (!) 97.2 F (36.2 C) (Temporal)   Resp 18   Ht 6' 3 (1.905 m)   Wt 196 lb (88.9 kg)   SpO2 94%   BMI 24.50 kg/m  BMI: Estimated body mass index is 24.5 kg/m as calculated from the following:   Height as of this encounter: 6' 3 (1.905 m).   Weight as of this encounter: 196 lb (88.9 kg). Ideal: Ideal body weight: 84.5 kg (186 lb 4.6 oz) Adjusted ideal body weight: 86.3 kg (190 lb 2.8 oz) General appearance: Well nourished, well developed, and well hydrated. In no apparent acute distress Mental status: Alert, oriented x 3 (person, place, & time)       Respiratory: No evidence of acute respiratory distress Eyes: PERLA  Musculoskeletal: Hip pain worse with walking, weightbearing, and standing Assessment   Diagnosis Status  1. Chronic lower extremity pain (1ry area of Pain) (Left)   2. Chronic pain syndrome   3. Pharmacologic therapy   4. Chronic low back pain (2ry area of Pain) (Bilateral) (ML) (L>R) w/ sciatica (Left)   5. Lumbosacral radiculopathy at L5  (Left)   6. Failed back surgical syndrome   7. Chronic use of opiate for therapeutic purpose    Controlled Controlled Controlled   Updated Problems: No problems updated.  Plan of Care  Problem-specific:  Assessment and Plan    Chronic pain syndrome with lumbosacral radiculopathy and postlaminectomy syndrome Managed with oxycodone  without reported side effects. - Continue oxycodone  every six hours as needed. - Evaluate medication schedule to identify potential causes of hallucinations. Patient's pain is well-controlled with oxycodone , will continue on current medication regimen.  Prescribing drug monitoring (PDMP) reviewed, findings consistent with the use of prescribed medication and no evidence of narcotic misuse or abuse.  No other side effects or adverse reaction reported to current pain medication regimen.  Schedule follow-up in 90 days for medication management.  Spinal cord stimulator device management Difficulty charging device, contacted [employer], awaiting response. - Follow up with [employer] regarding charging issue. - Coordinate with nurse for [employer]'s home visit for device management.  Suspected medication-induced  hallucinations Suspected due to Zoloft  or oxycodone , more frequent in morning and around lunchtime. Interaction between Zoloft  and oxycodone  considered. - Discuss potential interaction between Zoloft  and oxycodone  with psychiatrist as increased risk of serotonin syndrome.  - Consider adjusting Zoloft  dosage or frequency with his psychiatric consultation.  Chronic pain syndrome: Patient's pain is well-controlled with oxycodone , will continue on current medication regimen.  Prescribing drug monitoring (PDMP) reviewed, findings consistent with the use of prescribed medication and no evidence of narcotic misuse or abuse.  No other side effects or adverse reaction reported to current pain medication regimen.  The patient was advised to consult with her psychiatric  to discuss alternative medications or other treatment options for depression. Routine UDS (Drug serum) ordered today.      Chase Mendez has a current medication list which includes the following long-term medication(s): allopurinol , amlodipine , atorvastatin , famotidine , gabapentin , isosorbide  mononitrate, levothyroxine , losartan , mirabegron  er, naloxone , sertraline , [START ON 12/03/2023] oxycodone  hcl, [START ON 01/02/2024] oxycodone  hcl, [START ON 02/01/2024] oxycodone  hcl, and [DISCONTINUED] diphenhydramine.  Pharmacotherapy (Medications Ordered): Meds ordered this encounter  Medications   Oxycodone  HCl 10 MG TABS    Sig: Take 1 tablet (10 mg total) by mouth every 6 (six) hours as needed. Must last 30 days.    Dispense:  120 tablet    Refill:  0    DO NOT: delete (not duplicate); no partial-fill (will deny script to complete), no refill request (F/U required). DISPENSE: 1 day early if closed on fill date. WARN: No CNS-depressants within 8 hrs of med.   Oxycodone  HCl 10 MG TABS    Sig: Take 1 tablet (10 mg total) by mouth every 6 (six) hours as needed. Must last 30 days.    Dispense:  120 tablet    Refill:  0    DO NOT: delete (not duplicate); no partial-fill (will deny script to complete), no refill request (F/U required). DISPENSE: 1 day early if closed on fill date. WARN: No CNS-depressants within 8 hrs of med.   Oxycodone  HCl 10 MG TABS    Sig: Take 1 tablet (10 mg total) by mouth every 6 (six) hours as needed. Must last 30 days.    Dispense:  120 tablet    Refill:  0    DO NOT: delete (not duplicate); no partial-fill (will deny script to complete), no refill request (F/U required). DISPENSE: 1 day early if closed on fill date. WARN: No CNS-depressants within 8 hrs of med.   Orders:  Orders Placed This Encounter  Procedures   Drug Screen 10 W/Conf, Serum    Release to patient:   Immediate        Return in about 3 months (around 02/26/2024) for (F2F), (MM), Chase Blanch NP.     Recent Visits Date Type Provider Dept  08/29/23 Office Visit Lenae Wherley K, NP [facility]  Showing recent visits within past 90 days and meeting all other requirements Today's Visits Date Type Provider Dept  11/26/23 Office Visit Kendall Justo K, NP [facility]  Showing today's visits and meeting all other requirements Future Appointments Date Type Provider Dept  02/19/24 Appointment Jachelle Fluty K, NP [facility]  Showing future appointments within next 90 days and meeting all other requirements  I discussed the assessment and treatment plan with the patient. The patient was provided an opportunity to ask questions and all were answered. The patient agreed with the plan and demonstrated an understanding of the instructions.  Patient advised  to call back or seek an in-person evaluation if the symptoms or condition worsens.  I personally spent a total of 30 minutes in the care of the patient today including preparing to see the patient, getting/reviewing separately obtained history, performing a medically appropriate exam/evaluation, counseling and educating, placing orders, referring and communicating with other health care professionals, documenting clinical information in the EHR, independently interpreting results, communicating results, and coordinating care.  Note by: Keshanna Riso K Ashauna Bertholf, NP (TTS and AI technology used. I apologize for any typographical errors that were not detected and corrected.) Date: 11/26/2023; Time: 1:04 PM

## 2023-11-28 ENCOUNTER — Other Ambulatory Visit: Payer: Self-pay

## 2023-11-28 ENCOUNTER — Encounter: Payer: Self-pay | Admitting: Urology

## 2023-11-28 ENCOUNTER — Ambulatory Visit (INDEPENDENT_AMBULATORY_CARE_PROVIDER_SITE_OTHER): Admitting: Psychiatry

## 2023-11-28 ENCOUNTER — Encounter: Payer: Self-pay | Admitting: Psychiatry

## 2023-11-28 VITALS — BP 103/63 | HR 73 | Temp 97.5°F | Ht 75.0 in | Wt 196.0 lb

## 2023-11-28 DIAGNOSIS — F3341 Major depressive disorder, recurrent, in partial remission: Secondary | ICD-10-CM

## 2023-11-28 DIAGNOSIS — F330 Major depressive disorder, recurrent, mild: Secondary | ICD-10-CM | POA: Insufficient documentation

## 2023-11-28 DIAGNOSIS — F29 Unspecified psychosis not due to a substance or known physiological condition: Secondary | ICD-10-CM | POA: Insufficient documentation

## 2023-11-28 DIAGNOSIS — R419 Unspecified symptoms and signs involving cognitive functions and awareness: Secondary | ICD-10-CM

## 2023-11-28 DIAGNOSIS — F411 Generalized anxiety disorder: Secondary | ICD-10-CM | POA: Diagnosis not present

## 2023-11-28 NOTE — Progress Notes (Unsigned)
 [facility] MD OP Progress Note  11/28/2023 1:32 PM KEISEAN SKOWRON  MRN:  [id number]  Chief Complaint:  Chief Complaint  Patient presents with   Follow-up   Depression   Anxiety   Medication Refill   psychosis   Discussed the use of AI scribe software for clinical note transcription with the patient, who gave verbal consent to proceed.  History of Present Illness EMAAD NANNA Darrel) is an 89 year old Caucasian male, veteran, retired from [employer], lives in [place] with his wife, has a history of MDD, GAD, chronic pain, bereavement, foot drop left-sided, hypothyroidism, hypertension, history of frequent falls, hearing loss, macular degeneration with vision loss was evaluated in office today for a follow-up appointment.He is accompanied by his wife, Naomie.  Naomie reports that he continues to experience visual hallucinations, describing that he sees people, including children, outside on the deck and in the yard, as well as individuals near his buildings. She notes that he sometimes interacts with these perceived individuals, such as calling out to them to get down from wires. He states that these people appear real to him, though others do not see them. Both [name] and he report that the hallucinations have persisted.  He reports currently taking Zoloft  50 mg daily, splitting the dose as half in the morning and half at night, and taking Vistaril .  He denies any side effects however according to wife concerns about whether the sertraline /Zoloft  likely contributing to hallucinations since this may have been mentioned by one of the other providers.  She is also aware that macular degeneration could also cause visual hallucinations as vision gets worse.  He does have an ophthalmology appointment coming up in January.  He denies any thoughts of hurting himself or others and denies suicidal thoughts. He denies feeling anxious or depressed over the past week. He reports that his appetite is good.  Regarding sleep, he describes a routine of getting up around 7-8 a.m., watching television, then resting for 2-3 hours during the day, and going to bed at 8 p.m. He reports that his sleep feels good, though Naomie notes that he wakes up at night to check the time for his pain medication.    Visit Diagnosis:    ICD-10-CM   1. Recurrent major depressive disorder, in partial remission  F33.41    Mild    2. GAD (generalized anxiety disorder)  F41.1     3. Neurocognitive disorder  R41.9    likely mild    4. Psychosis, unspecified psychosis type (HCC)  F29       Past Psychiatric History: I have reviewed past psychiatric history from progress note on 09/07/2022.  Past trials of medications like Vraylar (cost), Remeron , Celexa , risperidone , Cymbalta , Wellbutrin , BuSpar , trazodone   Past Medical History:  Past Medical History:  Diagnosis Date   Acute encephalopathy 12/08/2014   Anxiety    ARF (acute renal failure) 12/08/2014   Back pain    Benign neoplasm of large bowel    Capsulitis    fractured displaced metatarsal with capsulitis   Chronic back pain    Coronavirus infection    Depression    Diabetes mellitus without complication (HCC)    no medications currently   Dysphagia    Exostosis    painful, right hallux   Foot drop, left    wears a brace   Frequent falls 02/2018   poor balance, foot drop   GERD (gastroesophageal reflux disease)    Gout    Hypertension  Hypothyroidism    Insomnia    Low testosterone     Microscopic hematuria 2016   Myocardial infarction [facility])    patient unaware when it happened years ago.     Pneumonia 12/07/2014   Pressure ulcer 12/09/2014   Sepsis (HCC) 12/08/2014   Thyroid  disease     Past Surgical History:  Procedure Laterality Date   APPENDECTOMY     BACK SURGERY  1995, 1996   x 2   CHOLECYSTECTOMY     ESOPHAGOGASTRODUODENOSCOPY (EGD) WITH PROPOFOL  N/A 04/24/2017   Procedure: ESOPHAGOGASTRODUODENOSCOPY (EGD) WITH PROPOFOL ;  Surgeon:  Viktoria Lamar DASEN, MD;  Location: [facility] ENDOSCOPY;  Service: Endoscopy;  Laterality: N/A;   EYE SURGERY Bilateral 1983, 1985   cataract extractions   SPINAL CORD STIMULATOR INSERTION N/A 03/13/2018   Procedure: SPINAL CORD STIMULATOR INSERTION;  Surgeon: Clois Fret, MD;  Location: [facility] ORS;  Service: Neurosurgery;  Laterality: N/A;    Family Psychiatric History: I have reviewed family psychiatric history from progress note on 09/07/2022.  Family History:  Family History  Problem Relation Age of Onset   Heart disease Mother    Diabetes Father    Kidney cancer Brother     Social History: I have reviewed social history from progress note on 09/07/2022. Social History   Socioeconomic History   Marital status: Married    Spouse name: dorothy   Number of children: 2   Years of education: Not on file   Highest education level: Some college, no degree  Occupational History   Occupation: maintenance / repair    Comment: disabled  Tobacco Use   Smoking status: Every Day    Types: Cigars   Smokeless tobacco: Never   Tobacco comments:    unable to give cessation materials due to webex visit   Vaping Use   Vaping status: Never Used  Substance and Sexual Activity   Alcohol use: No   Drug use: Yes    Types: Oxycodone    Sexual activity: Not Currently  Other Topics Concern   Not on file  Social History Narrative   Not on file   Social Drivers of Health   Financial Resource Strain: Low Risk  (01/22/2023)   Received from [facility]   Overall Financial Resource Strain (CARDIA)    Difficulty of Paying Living Expenses: Not hard at all  Food Insecurity: No Food Insecurity (01/22/2023)   Received from [facility]   Hunger Vital Sign    Within the past 12 months, you worried that your food would run out before you got the money to buy more.: Never true    Within the past 12 months, the food you bought just didn't last and you didn't have money to  get more.: Never true  Transportation Needs: No Transportation Needs (07/17/2022)   PRAPARE - Administrator, Civil Service (Medical): No    Lack of Transportation (Non-Medical): No  Physical Activity: Not on file  Stress: Not on file  Social Connections: Not on file    Allergies:  Allergies  Allergen Reactions   Wellbutrin  [Bupropion ]     hallucinations   Doxycycline Nausea Only   Ibuprofen Nausea Only   Sulfa Antibiotics Nausea Only    Metabolic Disorder Labs: Lab Results  Component Value Date   HGBA1C 5.6 07/18/2022   MPG 114.02 07/18/2022   No results found for: PROLACTIN Lab Results  Component Value Date   CHOL 86 (L) 09/10/2023   TRIG 93 09/10/2023  HDL 33 (L) 09/10/2023   CHOLHDL 2.6 09/10/2023   VLDL 23 12/08/2014   LDLCALC 35 09/10/2023   LDLCALC 37 06/07/2023   Lab Results  Component Value Date   TSH 0.828 09/10/2023   TSH 0.906 06/07/2023    Therapeutic Level Labs: No results found for: LITHIUM No results found for: VALPROATE No results found for: CBMZ  Current Medications: Current Outpatient Medications  Medication Sig Dispense Refill   acetaminophen  (TYLENOL ) 500 MG tablet Take 500-1,000 mg by mouth 2 (two) times daily as needed for mild pain.     allopurinol  (ZYLOPRIM ) 100 MG tablet TAKE 1 TABLET BY MOUTH EVERY OTHER DAY 45 tablet 1   amLODipine  (NORVASC ) 5 MG tablet Take 2.5 mg by mouth daily.     aspirin  81 MG tablet Take 81 mg by mouth daily.     atorvastatin  (LIPITOR) 10 MG tablet TAKE 1 TABLET BY MOUTH EVERYDAY AT BEDTIME 90 tablet 1   Cholecalciferol 50 MCG (2000 UT) TABS Take by mouth.     famotidine  (PEPCID ) 20 MG tablet TAKE 1 TABLET BY MOUTH TWICE A DAY 180 tablet 1   fluticasone  (FLONASE ) 50 MCG/ACT nasal spray Place 1-2 sprays into both nostrils daily as needed for allergies or rhinitis.     gabapentin  (NEURONTIN ) 600 MG tablet TAKE 1 TABLET BY MOUTH TWICE DAILY. 180 tablet 1   hydrOXYzine  (VISTARIL ) 25 MG  capsule Take 1 capsule (25 mg total) by mouth daily as needed for anxiety. 30 capsule 5   isosorbide  mononitrate (IMDUR ) 30 MG 24 hr tablet TAKE 1 TABLET BY MOUTH EVERY DAY 90 tablet 0   levothyroxine  (SYNTHROID ) 150 MCG tablet Take 1 tablet (150 mcg total) by mouth daily. 90 tablet 1   losartan  (COZAAR ) 100 MG tablet TAKE 1 TABLET BY MOUTH EVERY DAY 90 tablet 1   mirabegron  ER (MYRBETRIQ ) 50 MG TB24 tablet TAKE ONE TABLET BY MOUTH ONE TIME DAILY 30 tablet 11   naloxone  (NARCAN ) nasal spray 4 mg/0.1 mL Place 1 spray into the nose as needed for up to 365 doses (for opioid-induced respiratory depresssion). In case of emergency (overdose), spray once into each nostril. If no response within 3 minutes, repeat application and call 911. 1 each 0   ondansetron  (ZOFRAN -ODT) 4 MG disintegrating tablet TAKE 1 TABLET (4 MG TOTAL) BY MOUTH IN THE MORNING, AT NOON, IN THE EVENING, AND AT BEDTIME. 90 tablet 3   [START ON 12/03/2023] Oxycodone  HCl 10 MG TABS Take 1 tablet (10 mg total) by mouth every 6 (six) hours as needed. Must last 30 days. 120 tablet 0   [START ON 01/02/2024] Oxycodone  HCl 10 MG TABS Take 1 tablet (10 mg total) by mouth every 6 (six) hours as needed. Must last 30 days. 120 tablet 0   [START ON 02/01/2024] Oxycodone  HCl 10 MG TABS Take 1 tablet (10 mg total) by mouth every 6 (six) hours as needed. Must last 30 days. 120 tablet 0   sertraline  (ZOLOFT ) 50 MG tablet TAKE 1/2 TABLET BY MOUTH TWO TIMES DAILY 30 tablet 5   tamsulosin  (FLOMAX ) 0.4 MG CAPS capsule Take 0.4 mg by mouth daily.     vitamin B-12 (CYANOCOBALAMIN ) 1000 MCG tablet Take 1,000 mcg by mouth daily.     Vitamin D, Ergocalciferol, (DRISDOL) 1.25 MG (50000 UNIT) CAPS capsule TAKE 1 CAPSULE BY MOUTH ONE TIME PER WEEK 12 capsule 3   vitamin E  400 UNIT capsule Take 400 Units by mouth daily.     No current facility-administered  medications for this visit.     Musculoskeletal: Strength & Muscle Tone: limited on the lower  extremities Gait & Station: in a wheelchair Patient leans: N/A  Psychiatric Specialty Exam: Review of Systems  Psychiatric/Behavioral:  The patient is nervous/anxious.     Blood pressure 103/63, pulse 73, temperature (!) 97.5 F (36.4 C), temperature source Temporal, height 6' 3 (1.905 m), weight 196 lb (88.9 kg).Body mass index is 24.5 kg/m.  General Appearance: Casual  Eye Contact:  Fair  Speech:  Normal Rate  Volume:  Normal  Mood:  Anxious  Affect:  Congruent  Thought Process:  Goal Directed and Descriptions of Associations: Intact  Orientation:  Full (Time, Place, and Person)  Thought Content: Hallucinations: Visual people, although patient does not acknowledge these as hallucinations instead real to him, this is as observed by caregiver/spouse  Suicidal Thoughts:  No  Homicidal Thoughts:  No  Memory:  Immediate;   Fair Recent;   Fair Remote;   Poor  Judgement:  Fair  Insight:  Fair  Psychomotor Activity:  Normal  Concentration:  Concentration: Fair and Attention Span: Fair  Recall:  [employer] of Knowledge: Fair  Language: Fair  Akathisia:  No  Handed:  Right  AIMS (if indicated): done  Assets:  Communication Skills Desire for Improvement Housing Social Support Transportation  ADL's:  Intact  Cognition: WNL  Sleep:  Fair   Screenings: GAD-7    Garment/textile technologist Visit from 11/28/2023 in [facility] Office Visit from 10/23/2023 in [facility] Office Visit from 08/16/2023 in [facility] Counselor from 05/15/2023 in [facility] Office Visit from 01/24/2023 in [facility]  Total GAD-7 Score 8 15 9  0 11   PHQ2-9    Flowsheet Row Office Visit from 11/28/2023 in [facility] Office Visit from 11/26/2023 in [facility] Visit from 10[facility]23/2023 in [facility] Office Visit from 08/29/2023 in [facility] Visit from 08/16/2023 in [facility]  PHQ-2 Total Score 4 0 5 0 3  PHQ-9 Total Score 9 -- 12 -- 6   Flowsheet Row Office Visit from 11/28/2023 in [facility] Office Visit from 10/23/2023 in [facility] Office Visit from 08/16/2023 in [facility]  C-SSRS RISK CATEGORY No Risk No Risk No Risk     Assessment and Plan: ABHINAV MAYORQUIN is a 89 year old Caucasian male who has a history of depression, neurocognitive disorder, anxiety, chronic pain, macular degeneration with vision loss was evaluated in office today.  Discussed assessment and plan as noted below.  1. Recurrent major depressive disorder, in partial remission Currently reports depression symptoms have improved although he continues to have visual hallucinations likely from his macular degeneration.  Interested in trial of being tapered off of sertraline  to rule out medication induced psychosis. Encouraged to start taking Sertraline  25 mg for a week and then stop taking it for a week or 2 weeks. If visual hallucinations does not get better advised to go back on Sertraline  50 mg daily in divided dosage.  2. GAD (generalized anxiety disorder)-improving Currently denies any significant anxiety symptoms and reports pain is better managed. Continue Hydroxyzine  25 mg daily as needed Previously referred  to psychotherapist, currently noncompliant  3. Neurocognitive disorder chronic likely mild Was able to answer questions appropriately in session today. Consider neurology referral as needed  4. Psychosis, unspecified psychosis  type (HCC)-unstable Ongoing visual hallucinations of delivered people, images going on since the past few months.  It is episodic.  Does have macular degeneration and vision has been getting worse unknown if this is likely contributing to it.  These hallucinations are not distressing. Will taper off Sertraline  as noted above to rule out medication induced psychosis. Previous CT head, did not show any acute pathology dated 12/29/2019. If hallucinations does not get better could repeat CT scan.  Collateral information obtained from spouse as noted above.   Follow-up Follow-up in clinic in 6 to 7 weeks or sooner if needed.   Consent: Patient/Guardian gives verbal consent for treatment and assignment of benefits for services provided during this visit. Patient/Guardian expressed understanding and agreed to proceed.   This note was generated in part or whole with voice recognition software. Voice recognition is usually quite accurate but there are transcription errors that can and very often do occur. I apologize for any typographical errors that were not detected and corrected.    Keierra Nudo, MD 11/29/2023, 10:00 AM

## 2023-11-28 NOTE — Telephone Encounter (Signed)
 Called patient and talked to his wife and got him scheduled for injecting teaching on 12/06/2023 at 3:00pm

## 2023-11-28 NOTE — Telephone Encounter (Signed)
 Patient states he has not been applying the gel because there is a Geneticist, molecular. The last him applied was 6 months ago.     They sent this my chart message today Shortage on the testosterone  gel. Did PSA lab so Dr. could get a base line in order to get a script to [facility] for the Testosterone  Shot. What is the next step. My legs are getting so weak i can hardly stand. Thank you. My phone no. is [phone] 04/07/1934 Chase Mendez

## 2023-12-01 LAB — OXYCODONES,MS,WB/SP RFX
Oxycocone: 25.1 ng/mL
Oxycodones Confirmation: POSITIVE
Oxymorphone: NEGATIVE ng/mL

## 2023-12-01 LAB — DRUG SCREEN 10 W/CONF, SERUM
Amphetamines, IA: NEGATIVE ng/mL
Barbiturates, IA: NEGATIVE ug/mL
Benzodiazepines, IA: NEGATIVE ng/mL
Cocaine & Metabolite, IA: NEGATIVE ng/mL
Methadone, IA: NEGATIVE ng/mL
Opiates, IA: NEGATIVE ng/mL
Oxycodones, IA: POSITIVE ng/mL — AB
Phencyclidine, IA: NEGATIVE ng/mL
Propoxyphene, IA: NEGATIVE ng/mL
THC(Marijuana) Metabolite, IA: NEGATIVE ng/mL

## 2023-12-04 NOTE — Progress Notes (Deleted)
   Urological history: 1.  Hypogonadism  2. BPH with ED - Myrbetriq  50 mg daily  3. ED  Chase Mendez is a 89 y.o.presents today for education on self administration of testosterone  injections.  He has been prescribed testosterone  cypionate 200 mg/milliliters for the treatment of hypogonadism.  He expresses interest in administering the injections at home and has had no prior experience with intramuscular or subcutaneous injections.  He is motivated to learn and asks appropriate questions.  Physical exam: There were no vitals filed for this visit. Constitutional:  Well nourished. Alert and oriented, No acute distress. Psychiatric: Normal mood and affect.  No physical limitations noted that would interfere with self injections  Assessment: Hypogonadism-on testosterone  replacement therapy.  Needs instruction on safe for proper self injection technique.  Plan: - Reviewed and demonstrated the following with the patient: Hand hygiene Drew up medication with 18-gauge needle Switching to 23-gauge needle for injection Injections identification (gluteal or lateral thigh for IM: abdomen or thigh for SQ) Proper technique, angle, and depth Needle disposal sharps container Patient then performed return demonstration successfully with verbal guidance Provided written instructions and answered all questions Encouraged patient to call with any concerns or if unsure about future injections Will continue testosterone  therapy as prescribed Follow-up testosterone  level, hemoglobin and hematocrit in ***, *** after an injection

## 2023-12-06 ENCOUNTER — Other Ambulatory Visit: Payer: Self-pay | Admitting: Urology

## 2023-12-06 ENCOUNTER — Ambulatory Visit: Admitting: Urology

## 2023-12-06 DIAGNOSIS — E291 Testicular hypofunction: Secondary | ICD-10-CM

## 2023-12-06 MED ORDER — TESTOSTERONE CYPIONATE 200 MG/ML IM SOLN: 200.0000 mg | INTRAMUSCULAR | 0 refills | Status: DC

## 2023-12-09 NOTE — Progress Notes (Unsigned)
   Urological history: 1.  Hypogonadism  2. BPH with ED - Myrbetriq  50 mg daily  3. ED  Chase Mendez is a 89 y.o.presents today for education on self administration of testosterone  injections.  He has been prescribed testosterone  cypionate 200 mg/milliliters for the treatment of hypogonadism.  He expresses interest in administering the injections at home and has had no prior experience with intramuscular or subcutaneous injections.  He is motivated to learn and asks appropriate questions.  Physical exam: There were no vitals filed for this visit. Constitutional:  Well nourished. Alert and oriented, No acute distress. Psychiatric: Normal mood and affect.  No physical limitations noted that would interfere with self injections  Assessment: Hypogonadism-on testosterone  replacement therapy.  Needs instruction on safe for proper self injection technique.  Plan: - Reviewed and demonstrated the following with the patient: Hand hygiene Drew up medication with 18-gauge needle Switching to 23-gauge needle for injection Injections identification (gluteal or lateral thigh for IM: abdomen or thigh for SQ) Proper technique, angle, and depth Needle disposal sharps container Patient then performed return demonstration successfully with verbal guidance Provided written instructions and answered all questions Encouraged patient to call with any concerns or if unsure about future injections Will continue testosterone  therapy as prescribed Follow-up testosterone  level, hemoglobin and hematocrit in ***, *** after an injection

## 2023-12-10 ENCOUNTER — Other Ambulatory Visit: Payer: Self-pay | Admitting: *Deleted

## 2023-12-10 MED ORDER — BD SAFETYGLIDE NEEDLE 18G X 1-1/2" MISC: 0 refills | Status: AC

## 2023-12-10 MED ORDER — BD LUER-LOK SYRINGE 21G X 1-1/2" 3 ML MISC: 0 refills | Status: AC

## 2023-12-11 ENCOUNTER — Ambulatory Visit (INDEPENDENT_AMBULATORY_CARE_PROVIDER_SITE_OTHER): Admitting: Urology

## 2023-12-11 ENCOUNTER — Other Ambulatory Visit: Payer: Self-pay

## 2023-12-11 ENCOUNTER — Telehealth: Payer: Self-pay

## 2023-12-11 ENCOUNTER — Encounter: Payer: Self-pay | Admitting: Urology

## 2023-12-11 VITALS — BP 141/64 | HR 68 | Ht 72.0 in | Wt 198.0 lb

## 2023-12-11 DIAGNOSIS — E291 Testicular hypofunction: Secondary | ICD-10-CM

## 2023-12-11 NOTE — Telephone Encounter (Signed)
 Tried calling patient to inform him about his appointment being rescheduled for 02/13/24 from [facility] for 9:40 and changed to 02/13/24 @11  on the lab visit schedule. Sent a Buyer, Retail message as well. Aubrey Dante LATHER

## 2023-12-21 ENCOUNTER — Ambulatory Visit: Admitting: Internal Medicine

## 2023-12-24 ENCOUNTER — Other Ambulatory Visit

## 2023-12-24 DIAGNOSIS — E782 Mixed hyperlipidemia: Secondary | ICD-10-CM | POA: Diagnosis not present

## 2023-12-24 DIAGNOSIS — E039 Hypothyroidism, unspecified: Secondary | ICD-10-CM | POA: Diagnosis not present

## 2023-12-25 LAB — COMPREHENSIVE METABOLIC PANEL WITH GFR
ALT: 11 IU/L (ref 0–44)
AST: 23 IU/L (ref 0–40)
Albumin: 3.7 g/dL (ref 3.7–4.7)
Alkaline Phosphatase: 140 IU/L — ABNORMAL HIGH (ref 48–129)
BUN/Creatinine Ratio: 8 — ABNORMAL LOW (ref 10–24)
BUN: 10 mg/dL (ref 8–27)
Bilirubin Total: 0.7 mg/dL (ref 0.0–1.2)
CO2: 23 mmol/L (ref 20–29)
Calcium: 8.4 mg/dL — ABNORMAL LOW (ref 8.6–10.2)
Chloride: 106 mmol/L (ref 96–106)
Creatinine, Ser: 1.25 mg/dL (ref 0.76–1.27)
Globulin, Total: 2.6 g/dL (ref 1.5–4.5)
Glucose: 146 mg/dL — ABNORMAL HIGH (ref 70–99)
Potassium: 5 mmol/L (ref 3.5–5.2)
Sodium: 144 mmol/L (ref 134–144)
Total Protein: 6.3 g/dL (ref 6.0–8.5)
eGFR: 56 mL/min/1.73 — ABNORMAL LOW (ref >59)

## 2023-12-25 LAB — TSH: TSH: 0.675 u[IU]/mL (ref 0.450–4.500)

## 2023-12-25 NOTE — Progress Notes (Unsigned)
 Testosterone  IM Injection  Due to Hypogonadism patient is present today for a Testosterone  Injection.  Medication: Testosterone  Cypionate Dose: 1 ml Location: left upper outer buttocks Lot: [id number] Exp:04/05/2026  Patient tolerated well, no complications were noted  Performed by: Mathew Pinal, RN  Follow up:  01/11/24 for testosterone  lab

## 2023-12-26 ENCOUNTER — Ambulatory Visit: Payer: Self-pay | Admitting: Internal Medicine

## 2023-12-26 ENCOUNTER — Ambulatory Visit (INDEPENDENT_AMBULATORY_CARE_PROVIDER_SITE_OTHER): Admitting: Internal Medicine

## 2023-12-26 ENCOUNTER — Ambulatory Visit (INDEPENDENT_AMBULATORY_CARE_PROVIDER_SITE_OTHER): Admitting: Physician Assistant

## 2023-12-26 ENCOUNTER — Encounter: Payer: Self-pay | Admitting: Internal Medicine

## 2023-12-26 VITALS — BP 132/68 | HR 79 | Temp 97.4°F | Ht 72.0 in | Wt 198.0 lb

## 2023-12-26 DIAGNOSIS — E782 Mixed hyperlipidemia: Secondary | ICD-10-CM | POA: Diagnosis not present

## 2023-12-26 DIAGNOSIS — E291 Testicular hypofunction: Secondary | ICD-10-CM

## 2023-12-26 DIAGNOSIS — E039 Hypothyroidism, unspecified: Secondary | ICD-10-CM

## 2023-12-26 DIAGNOSIS — I10 Essential (primary) hypertension: Secondary | ICD-10-CM | POA: Diagnosis not present

## 2023-12-26 DIAGNOSIS — Z0001 Encounter for general adult medical examination with abnormal findings: Secondary | ICD-10-CM

## 2023-12-26 MED ORDER — TESTOSTERONE CYPIONATE 200 MG/ML IM SOLN
200.0000 mg | INTRAMUSCULAR | Status: DC
  Administered 2023-12-26: 200 mg via INTRAMUSCULAR

## 2023-12-26 NOTE — Progress Notes (Signed)
 Established Patient Office Visit  Subjective:  Patient ID: Chase Mendez, male    DOB: April 07, 1934  Age: 89 y.o. MRN: [id number]  Chief Complaint  Patient presents with  . Results    3 month AWV lab results.    No new complaints, here for AWV refer to quality metrics and scanned documents.   Wife report multiple falls over the last few weeks, sustaining a large abrasion to his left arm and cut to his left hand. He refused to go to the ER for evaluation.    No other concerns at this time.   Past Medical History:  Diagnosis Date  . Acute encephalopathy 12/08/2014  . Anxiety   . ARF (acute renal failure) 12/08/2014  . Back pain   . Benign neoplasm of large bowel   . Capsulitis    fractured displaced metatarsal with capsulitis  . Chronic back pain   . Coronavirus infection   . Depression   . Diabetes mellitus without complication (HCC)    no medications currently  . Dysphagia   . Exostosis    painful, right hallux  . Foot drop, left    wears a brace  . Frequent falls 02/2018   poor balance, foot drop  . GERD (gastroesophageal reflux disease)   . Gout   . Hypertension   . Hypothyroidism   . Insomnia   . Low testosterone    . Microscopic hematuria 2016  . Myocardial infarction [facility])    patient unaware when it happened years ago.    . Pneumonia 12/07/2014  . Pressure ulcer 12/09/2014  . Sepsis (HCC) 12/08/2014  . Thyroid  disease     Past Surgical History:  Procedure Laterality Date  . APPENDECTOMY    . BACK SURGERY  1995, 1996   x 2  . CHOLECYSTECTOMY    . ESOPHAGOGASTRODUODENOSCOPY (EGD) WITH PROPOFOL  N/A 04/24/2017   Procedure: ESOPHAGOGASTRODUODENOSCOPY (EGD) WITH PROPOFOL ;  Surgeon: Viktoria Lamar DASEN, MD;  Location: [facility] ENDOSCOPY;  Service: Endoscopy;  Laterality: N/A;  . EYE SURGERY Bilateral 1983, 1985   cataract extractions  . SPINAL CORD STIMULATOR INSERTION N/A 03/13/2018   Procedure: SPINAL CORD STIMULATOR INSERTION;  Surgeon: Clois Fret, MD;   Location: [facility] ORS;  Service: Neurosurgery;  Laterality: N/A;    Social History   Socioeconomic History  . Marital status: Married    Spouse name: dorothy  . Number of children: 2  . Years of education: Not on file  . Highest education level: Some college, no degree  Occupational History  . Occupation: maintenance / repair    Comment: disabled  Tobacco Use  . Smoking status: Every Day    Types: Cigars  . Smokeless tobacco: Never  . Tobacco comments:    unable to give cessation materials due to webex visit   Vaping Use  . Vaping status: Never Used  Substance and Sexual Activity  . Alcohol use: No  . Drug use: Yes    Types: Oxycodone   . Sexual activity: Not Currently  Other Topics Concern  . Not on file  Social History Narrative  . Not on file   Social Drivers of Health   Financial Resource Strain: Low Risk  (01/22/2023)   Received from [facility]   Overall Financial Resource Strain (CARDIA)   . Difficulty of Paying Living Expenses: Not hard at all  Food Insecurity: No Food Insecurity (01/22/2023)   Received from [facility]   Hunger Vital Sign   . Within the past  12 months, you worried that your food would run out before you got the money to buy more.: Never true   . Within the past 12 months, the food you bought just didn't last and you didn't have money to get more.: Never true  Transportation Needs: No Transportation Needs (07/17/2022)   PRAPARE - Transportation   . Lack of Transportation (Medical): No   . Lack of Transportation (Non-Medical): No  Physical Activity: Not on file  Stress: Not on file  Social Connections: Not on file  Intimate Partner Violence: Not At Risk (07/17/2022)   Humiliation, Afraid, Rape, and Kick questionnaire   . Fear of Current or Ex-Partner: No   . Emotionally Abused: No   . Physically Abused: No   . Sexually Abused: No    Family History  Problem Relation Age of Onset  . Heart disease Mother    . Diabetes Father   . Kidney cancer Brother     Allergies  Allergen Reactions  . Wellbutrin  [Bupropion ]     hallucinations  . Doxycycline Nausea Only  . Ibuprofen Nausea Only  . Sulfa Antibiotics Nausea Only    Outpatient Medications Prior to Visit  Medication Sig  . acetaminophen  (TYLENOL ) 500 MG tablet Take 500-1,000 mg by mouth 2 (two) times daily as needed for mild pain.  . allopurinol  (ZYLOPRIM ) 100 MG tablet TAKE 1 TABLET BY MOUTH EVERY OTHER DAY  . amLODipine  (NORVASC ) 5 MG tablet Take 2.5 mg by mouth daily. (Patient taking differently: Take 5 mg by mouth as needed.)  . aspirin  81 MG tablet Take 81 mg by mouth daily.  . atorvastatin  (LIPITOR) 10 MG tablet TAKE 1 TABLET BY MOUTH EVERYDAY AT BEDTIME  . Cholecalciferol 50 MCG (2000 UT) TABS Take by mouth.  . famotidine  (PEPCID ) 20 MG tablet TAKE 1 TABLET BY MOUTH TWICE A DAY  . fluticasone  (FLONASE ) 50 MCG/ACT nasal spray Place 1-2 sprays into both nostrils daily as needed for allergies or rhinitis.  . gabapentin  (NEURONTIN ) 600 MG tablet TAKE 1 TABLET BY MOUTH TWICE DAILY.  . hydrOXYzine  (VISTARIL ) 25 MG capsule Take 1 capsule (25 mg total) by mouth daily as needed for anxiety.  . isosorbide  mononitrate (IMDUR ) 30 MG 24 hr tablet TAKE 1 TABLET BY MOUTH EVERY DAY  . levothyroxine  (SYNTHROID ) 150 MCG tablet Take 1 tablet (150 mcg total) by mouth daily.  . losartan  (COZAAR ) 100 MG tablet TAKE 1 TABLET BY MOUTH EVERY DAY (Patient taking differently: Take 100 mg by mouth as needed.)  . mirabegron  ER (MYRBETRIQ ) 50 MG TB24 tablet TAKE ONE TABLET BY MOUTH ONE TIME DAILY  . naloxone  (NARCAN ) nasal spray 4 mg/0.1 mL Place 1 spray into the nose as needed for up to 365 doses (for opioid-induced respiratory depresssion). In case of emergency (overdose), spray once into each nostril. If no response within 3 minutes, repeat application and call 911.  SABRA NEEDLE, DISP, 18 G (BD SAFETYGLIDE NEEDLE) 18G X 1-1/2 MISC Use this to Draw up with  .  ondansetron  (ZOFRAN -ODT) 4 MG disintegrating tablet TAKE 1 TABLET (4 MG TOTAL) BY MOUTH IN THE MORNING, AT NOON, IN THE EVENING, AND AT BEDTIME.  . Oxycodone  HCl 10 MG TABS Take 1 tablet (10 mg total) by mouth every 6 (six) hours as needed. Must last 30 days.  SABRA CASTLEMAN ON 01/02/2024] Oxycodone  HCl 10 MG TABS Take 1 tablet (10 mg total) by mouth every 6 (six) hours as needed. Must last 30 days.  SABRA CASTLEMAN ON 02/01/2024] Oxycodone  HCl  10 MG TABS Take 1 tablet (10 mg total) by mouth every 6 (six) hours as needed. Must last 30 days.  . SYRINGE-NEEDLE, DISP, 3 ML (B-D 3CC LUER-LOK SYR 21GX1-1/2) 21G X 1-1/2 3 ML MISC Use this needle to injection  . testosterone  cypionate (DEPOTESTOSTERONE CYPIONATE) 200 MG/ML injection Inject 1 mL (200 mg total) into the muscle every 14 (fourteen) days.  . vitamin B-12 (CYANOCOBALAMIN ) 1000 MCG tablet Take 1,000 mcg by mouth daily.  . Vitamin D, Ergocalciferol, (DRISDOL) 1.25 MG (50000 UNIT) CAPS capsule TAKE 1 CAPSULE BY MOUTH ONE TIME PER WEEK  . vitamin E  400 UNIT capsule Take 400 Units by mouth daily.  . sertraline  (ZOLOFT ) 50 MG tablet TAKE 1/2 TABLET BY MOUTH TWO TIMES DAILY   Facility-Administered Medications Prior to Visit  Medication Dose Route Frequency Provider  . testosterone  cypionate (DEPOTESTOSTERONE CYPIONATE) injection 200 mg  200 mg Intramuscular Q14 Days McGowan, Shannon A, PA-C    Review of Systems  Constitutional: Negative.   HENT: Negative.    Eyes:  Positive for blurred vision.  Cardiovascular: Negative.   Gastrointestinal:  Positive for nausea.  Genitourinary: Negative.   Musculoskeletal:  Positive for falls.  Skin: Negative.   Neurological: Negative.   Endo/Heme/Allergies: Negative.   Psychiatric/Behavioral:  Positive for depression and hallucinations. The patient is nervous/anxious.        Objective:   BP 132/68   Pulse 79   Temp (!) 97.4 F (36.3 C)   Ht 6' (1.829 m)   Wt 198 lb (89.8 kg)   SpO2 95%   BMI 26.85 kg/m    Vitals:   12/26/23 1130  BP: 132/68  Pulse: 79  Temp: (!) 97.4 F (36.3 C)  Height: 6' (1.829 m)  Weight: 198 lb (89.8 kg)  SpO2: 95%  BMI (Calculated): 26.85    Physical Exam Vitals reviewed.  Constitutional:      Appearance: Normal appearance.  HENT:     Head: Normocephalic.     Left Ear: There is no impacted cerumen.     Nose: Nose normal.     Mouth/Throat:     Mouth: Mucous membranes are dry.     Pharynx: No posterior oropharyngeal erythema.  Eyes:     Extraocular Movements: Extraocular movements intact.     Pupils: Pupils are equal, round, and reactive to light.  Cardiovascular:     Rate and Rhythm: Regular rhythm.     Chest Wall: PMI is not displaced.     Pulses: Normal pulses.     Heart sounds: Normal heart sounds. No murmur heard. Pulmonary:     Effort: Pulmonary effort is normal.     Breath sounds: Normal air entry. No rhonchi or rales.  Abdominal:     General: Abdomen is flat. Bowel sounds are normal. There is no distension.     Palpations: Abdomen is soft. There is no hepatomegaly, splenomegaly or mass.     Tenderness: There is no abdominal tenderness.  Musculoskeletal:        General: Normal range of motion.     Cervical back: Normal range of motion and neck supple.     Right lower leg: No edema.     Left lower leg: No edema.  Skin:    General: Skin is warm and dry.     Findings: Signs of injury (right ankle pressure injury) present.     Comments: Decreased skin turgor  Neurological:     General: No focal deficit present.     Mental  Status: He is alert and oriented to person, place, and time.     Cranial Nerves: No cranial nerve deficit.     Motor: No weakness.  Psychiatric:        Mood and Affect: Mood normal.        Behavior: Behavior normal.      No results found for any visits on 12/26/23.      Assessment & Plan:  Chase Mendez was seen today for results.  Acquired hypothyroidism -     TSH  Essential (primary) hypertension  Mixed  hyperlipidemia -     Lipid panel  Primary hypertension   The patient has a history of falls. I did complete a risk assessment for falls. A plan of care for falls was documented.  Problem List Items Addressed This Visit       Cardiovascular and Mediastinum   Essential (primary) hypertension     Endocrine   Hypothyroidism - Primary   Relevant Orders   TSH     Other   Mixed hyperlipidemia   Other Visit Diagnoses       Primary hypertension           Return in about 3 months (around 03/27/2024) for fu with labs prior.   Total time spent: 30 minutes. This time includes review of previous notes and results and patient face to face interaction during today'Tyneka Scafidi visit.    Sherrill Cinderella Perry, MD  12/26/2023   This document may have been prepared by [facility] Voice Recognition software and as such may include unintentional dictation errors.

## 2024-01-10 ENCOUNTER — Other Ambulatory Visit: Payer: Self-pay | Admitting: Psychiatry

## 2024-01-10 DIAGNOSIS — F411 Generalized anxiety disorder: Secondary | ICD-10-CM

## 2024-01-11 ENCOUNTER — Other Ambulatory Visit

## 2024-01-11 DIAGNOSIS — E291 Testicular hypofunction: Secondary | ICD-10-CM

## 2024-01-12 ENCOUNTER — Ambulatory Visit: Payer: Self-pay | Admitting: Urology

## 2024-01-12 ENCOUNTER — Other Ambulatory Visit: Payer: Self-pay | Admitting: Internal Medicine

## 2024-01-12 DIAGNOSIS — K2100 Gastro-esophageal reflux disease with esophagitis, without bleeding: Secondary | ICD-10-CM

## 2024-01-12 LAB — HEMOGLOBIN AND HEMATOCRIT, BLOOD
Hematocrit: 39.5 % (ref 37.5–51.0)
Hemoglobin: 11.6 g/dL — ABNORMAL LOW (ref 13.0–17.7)

## 2024-01-12 LAB — TESTOSTERONE: Testosterone: 248 ng/dL — ABNORMAL LOW (ref 264–916)

## 2024-01-13 ENCOUNTER — Other Ambulatory Visit: Payer: Self-pay | Admitting: Cardiovascular Disease

## 2024-01-13 DIAGNOSIS — R0789 Other chest pain: Secondary | ICD-10-CM

## 2024-01-17 ENCOUNTER — Ambulatory Visit: Admitting: Psychiatry

## 2024-02-01 ENCOUNTER — Other Ambulatory Visit: Payer: Self-pay | Admitting: Internal Medicine

## 2024-02-11 DIAGNOSIS — E291 Testicular hypofunction: Secondary | ICD-10-CM

## 2024-02-13 ENCOUNTER — Other Ambulatory Visit

## 2024-02-13 ENCOUNTER — Ambulatory Visit: Admitting: Urology

## 2024-02-13 DIAGNOSIS — E291 Testicular hypofunction: Secondary | ICD-10-CM

## 2024-02-14 LAB — TESTOSTERONE: Testosterone: 994 ng/dL — ABNORMAL HIGH (ref 264–916)

## 2024-02-14 LAB — HEMOGLOBIN AND HEMATOCRIT, BLOOD
Hematocrit: 33.3 % — ABNORMAL LOW (ref 37.5–51.0)
Hemoglobin: 10.4 g/dL — ABNORMAL LOW (ref 13.0–17.7)

## 2024-02-15 ENCOUNTER — Ambulatory Visit: Payer: Self-pay | Admitting: Urology

## 2024-02-18 ENCOUNTER — Telehealth: Payer: Self-pay | Admitting: Urology

## 2024-02-18 ENCOUNTER — Telehealth: Payer: Self-pay

## 2024-02-18 NOTE — Telephone Encounter (Signed)
 A message was left on 02/15/24 with Access Nurse by Russell with [employer] stating that they are needing prior authorization. No other information was left. Her contact number is [phone], and fax number is [phone]. Patient was seen by Clotilda on 02/13/24.

## 2024-02-18 NOTE — Telephone Encounter (Signed)
 Incoming call from insurance rep of [employer], who is calling to initiate prior auth on Myrbetriq  50mg . Auth information provided. PA approved.

## 2024-02-18 NOTE — Progress Notes (Signed)
 Left message for patient to return my call.

## 2024-02-18 NOTE — Progress Notes (Unsigned)
 PROVIDER NOTE: Interpretation of information contained herein should be left to medically-trained personnel. Specific patient instructions are provided elsewhere under Patient Instructions section of medical record. This document was created in part using AI and STT-dictation technology, any transcriptional errors that may result from this process are unintentional.  Patient: Chase Mendez  Service: E/M   PCP: Albina GORMAN Dine, MD  DOB: Dec 26, 1934  DOS: 02/19/2024  Provider: Emmy MARLA Blanch, NP  MRN: [id number]  Delivery: Face-to-face  Specialty: Interventional Pain Management  Type: Established Patient  Setting: Ambulatory outpatient facility  Specialty designation: 09  Referring Prov.: Albina GORMAN Dine, MD  Location: Outpatient office facility       History of present illness (HPI) Chase Mendez, a 89 y.o. year old male, is here today because of his left hip pain. Chase Mendez primary complain today is Hip Pain (left)  Pertinent problems: Chase Mendez has Lower limb pain, inferior (L5) (Foot Drop) (Left); Chronic low back pain (2ry area of Pain) (Bilateral) (ML) (L>R) w/ sciatica (Left); Chronic lower extremity pain (1ry area of Pain) (Left); Failed back surgical syndrome; Foot drop (Left); Lumbosacral radiculopathy at L5 (Left); Disorder of skeletal system; Pharmacologic therapy; Problems influencing health status; Opioid Drug tolerance; DDD (degenerative disc disease), lumbar; Chronic pain syndrome; Thoracic central spinal stenosis; Claustrophobia; Chronic musculoskeletal pain; Neurogenic pain; Presence of neurostimulator; Gout of left foot due to renal impairment; Uncomplicated opioid dependence (HCC); Long term prescription opiate use; Chronic use of opiate for therapeutic purpose; Cervicalgia; Cervical facet syndrome; DDD (degenerative disc disease), cervical; Cervicogenic headache; Painful cervical range of motion; Impaired range of motion of cervical spine; Spondylosis without myelopathy or  radiculopathy, cervical region; and MDD (major depressive disorder), recurrent episode, moderate (HCC) on their pertinent problem list.  Pain Assessment: Severity of Chronic pain is reported as a 8 /10. Location: Hip Lower/radiates down left leg. Onset: More than a month ago. Quality: Stabbing, Throbbing. Timing: Constant. Modifying factor(s): meds. Vitals:  height is 6' (1.829 m) and weight is 198 lb (89.8 kg). His temperature is 97.5 F (36.4 C) (abnormal). His blood pressure is 130/66 and his pulse is 71. His respiration is 18 and oxygen saturation is 92%.  BMI: Estimated body mass index is 26.85 kg/m as calculated from the following:   Height as of this encounter: 6' (1.829 m).   Weight as of this encounter: 198 lb (89.8 kg).  Last encounter: 11/26/2023. Last procedure: Visit date not found.  Reason for encounter: medication management.  The patient indicates doing well with current medication regimen.  No side effects or adverse reaction reported to medication.  Discussed the use of AI scribe software for clinical note transcription with the patient, who gave verbal consent to proceed.  History of Present Illness   Chase Mendez is an 89 year old male with a spinal cord stimulator who presents with difficulty maintaining its charge and left hip pain.  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  These impairments lead to difficulty maintaining the device and controlled state and remembering to charge it consistently.  [employer] representative present to recharge his battery and device at today's visit.   He has a spinal cord stimulator implanted but is experiencing difficulty keeping it charged. This issue has persisted for several months, primarily due to challenges in remembering to  charge the  device and sitting still long enough for it to charge effectively. As a result, he is not receiving the intended efficacy from the stimulator. Previously, his programming was adjusted to use less energy, but the problem persists.  He experiences left hip pain that radiates down to his toes. Recently, he has begun experiencing pain in his right leg, particularly around the ankle area. The pain in the right ankle is described as a burning sensation, and there is a red area on the bone, likely from prolonged pressure due to his sleeping habits. He spends a significant amount of time in bed, going to bed early in the evening and waking up late in the morning, with additional naps during the day.  His past medical history includes two back surgeries following an injury in 1995, which have left him with persistent back issues. He was previously on methadone  for pain management but has since been switched to oxycodone . He takes oxycodone  every six hours, although he sometimes misses doses during the night.  He has not attended church in the past five weeks due to cold weather and his current health issues.     Pharmacotherapy Assessment   Oxycodone  HCl 10 mg tablets every 6 hours as needed for pain. MME=60  Monitoring: [place] PMP: PDMP reviewed during this encounter.       Pharmacotherapy: No side-effects or adverse reactions reported. Compliance: No problems identified. Effectiveness: Clinically acceptable.  Rebecka Wolm HERO, RN  02/19/2024  9:45 AM  Sign when Signing Visit Nursing Pain Medication Assessment:  Safety precautions to be maintained throughout the outpatient stay will include: orient to surroundings, keep bed in low position, maintain call bell within reach at all times, provide assistance with transfer out of bed and ambulation.  Medication Inspection Compliance: Pill count conducted under aseptic conditions, in front of the patient. Neither the pills nor the bottle was removed from the  patient's sight at any time. Once count was completed pills were immediately returned to the patient in their original bottle.  Medication: Oxycodone  IR Pill/Patch Count: 76 of 120 pills/patches remain Pill/Patch Appearance: Markings consistent with prescribed medication Bottle Appearance: Standard pharmacy container. Clearly labeled. Filled Date: 40 / 26 / 2025 Last Medication intake:  Today    UDS:  Summary  Date Value Ref Range Status  09/05/2022 Note  Final    Comment:    ==================================================================== [employer] [facility] (MW) ==================================================================== Test                             Result       Flag       Units  Drug Present and Declared for Prescription Verification   Oxycodone                       3918         EXPECTED   ng/mg creat   Oxymorphone                    979          EXPECTED   ng/mg creat   Noroxycodone                   7697         EXPECTED   ng/mg creat   Noroxymorphone                 868  EXPECTED   ng/mg creat    Sources of oxycodone  are scheduled prescription medications.    Oxymorphone, noroxycodone, and noroxymorphone are expected    metabolites of oxycodone . Oxymorphone is also available as a    scheduled prescription medication.  ==================================================================== Test                      Result    Flag   Units      Ref Range   Creatinine              34               mg/dL      >=79 ==================================================================== Declared Medications:  The flagging and interpretation on this report are based on the  following declared medications.  Unexpected results may arise from  inaccuracies in the declared medications.   **Note: The testing scope of this panel includes these medications:   Oxycodone    **Note: The testing scope of this panel does not include the  following reported  medications:   Acetaminophen  (Tylenol )  Allopurinol   Amlodipine   Aspirin   Atorvastatin   Diphenhydramine (Benadryl)  Famotidine   Fluticasone  (Flonase )  Gabapentin  (Neurontin )  Isosorbide  (Imdur )  Levothyroxine   Losartan   Mirabegron  (Myrbetriq )  Naloxone  (Narcan )  Ondansetron   Sertraline  (Zoloft )  Tamsulosin  (Flomax )  Testosterone   Vitamin B12  Vitamin D2 (Drisdol)  Vitamin E  ==================================================================== For clinical consultation, please call [phone]. ====================================================================     No results found for: CBDTHCR No results found for: D8THCCBX No results found for: D9THCCBX  ROS  Constitutional: Denies any fever or chills Gastrointestinal: No reported hemesis, hematochezia, vomiting, or acute GI distress Musculoskeletal: Left hip pain Neurological: No reported episodes of acute onset apraxia, aphasia, dysarthria, agnosia, amnesia, paralysis, loss of coordination, or loss of consciousness  Medication Review  Cholecalciferol, NEEDLE (DISP) 18 G, Oxycodone  HCl, SYRINGE-NEEDLE (DISP) 3 ML, Vitamin D (Ergocalciferol), acetaminophen , allopurinol , amLODipine , aspirin , atorvastatin , cyanocobalamin , diphenhydrAMINE, famotidine , fluticasone , gabapentin , hydrOXYzine , isosorbide  mononitrate, levothyroxine , losartan , mirabegron  ER, naloxone , ondansetron , sertraline , testosterone  cypionate, and vitamin E   History Review  Allergy: Chase Mendez is allergic to wellbutrin  [bupropion ], doxycycline, ibuprofen, and sulfa antibiotics. Drug: Chase Mendez  reports current drug use. Drug: Oxycodone . Alcohol:  reports no history of alcohol use. Tobacco:  reports that he has been smoking cigars. He has never used smokeless tobacco. Social: Chase Mendez  reports that he has been smoking cigars. He has never used smokeless tobacco. He reports current drug use. Drug: Oxycodone . He reports that he does not drink  alcohol. Medical:  has a past medical history of Acute encephalopathy (12/08/2014), Anxiety, ARF (acute renal failure) (12/08/2014), Back pain, Benign neoplasm of large bowel, Capsulitis, Chronic back pain, Coronavirus infection, Depression, Diabetes mellitus without complication (HCC), Dysphagia, Exostosis, Foot drop, left, Frequent falls (02/2018), GERD (gastroesophageal reflux disease), Gout, Hypertension, Hypothyroidism, Insomnia, Low testosterone , Microscopic hematuria (2016), Myocardial infarction (HCC), Pneumonia (12/07/2014), Pressure ulcer (12/09/2014), Sepsis (HCC) (12/08/2014), and Thyroid  disease. Surgical: Chase Mendez  has a past surgical history that includes Cholecystectomy; Esophagogastroduodenoscopy (egd) with propofol  (N/A, 04/24/2017); Back surgery (1995, 1996); Eye surgery (Bilateral, 1983, 1985); Appendectomy; and Spinal cord stimulator insertion (N/A, 03/13/2018). Family: family history includes Diabetes in his father; Heart disease in his mother; Kidney cancer in his brother.  Laboratory Chemistry Profile   Renal Lab Results  Component Value Date   BUN 10 12/24/2023   CREATININE 1.25 12/24/2023   BCR 8 (L) 12/24/2023   GFRAA >60 01/28/2019   GFRNONAA >60 07/19/2022  Hepatic Lab Results  Component Value Date   AST 23 12/24/2023   ALT 11 12/24/2023   ALBUMIN 3.7 12/24/2023   ALKPHOS 140 (H) 12/24/2023   HCVAB <0.1 07/24/2018   LIPASE 37 08/21/2016    Electrolytes Lab Results  Component Value Date   NA 144 12/24/2023   K 5.0 12/24/2023   CL 106 12/24/2023   CALCIUM  8.4 (L) 12/24/2023   MG 2.2 07/18/2022   PHOS 2.6 07/19/2022    Bone Lab Results  Component Value Date   TESTOSTERONE  994 (H) 02/13/2024    Inflammation (CRP: Acute Phase) (ESR: Chronic Phase) Lab Results  Component Value Date   CRP 6.6 (H) 07/19/2022   LATICACIDVEN 1.7 07/17/2022         Note: Above Lab results reviewed.  Recent Imaging Review  US  Venous Img Lower Bilateral (DVT) CLINICAL  DATA:  13689 Swelling 13689  EXAM: BILATERAL LOWER EXTREMITY VENOUS DOPPLER ULTRASOUND  TECHNIQUE: Gray-scale sonography with graded compression, as well as color Doppler and duplex ultrasound were performed to evaluate the lower extremity deep venous systems from the level of the common femoral vein and including the common femoral, femoral, profunda femoral, popliteal and calf veins including the posterior tibial, peroneal and gastrocnemius veins when visible. The superficial great saphenous vein was also interrogated. Spectral Doppler was utilized to evaluate flow at rest and with distal augmentation maneuvers in the common femoral, femoral and popliteal veins.  COMPARISON:  None Available.  FINDINGS: RIGHT LOWER EXTREMITY  Common Femoral Vein: No evidence of thrombus. Normal compressibility, respiratory phasicity and response to augmentation.  Saphenofemoral Junction: No evidence of thrombus. Normal compressibility and flow on color Doppler imaging.  Profunda Femoral Vein: No evidence of thrombus. Normal compressibility and flow on color Doppler imaging.  Femoral Vein: No evidence of thrombus. Normal compressibility, respiratory phasicity and response to augmentation.  Popliteal Vein: No evidence of thrombus. Normal compressibility, respiratory phasicity and response to augmentation.  Calf Veins: Calf veins were poorly visualized on grayscale imaging, but were without evidence of thrombus on color Doppler evaluation  Superficial Great Saphenous Vein: No evidence of thrombus. Normal compressibility.  Venous Reflux:  None.  Other Findings:  None.  LEFT LOWER EXTREMITY  Common Femoral Vein: No evidence of thrombus. Normal compressibility, respiratory phasicity and response to augmentation.  Saphenofemoral Junction: No evidence of thrombus. Normal compressibility and flow on color Doppler imaging.  Profunda Femoral Vein: No evidence of thrombus.  Normal compressibility and flow on color Doppler imaging.  Femoral Vein: No evidence of thrombus. Normal compressibility, respiratory phasicity and response to augmentation.  Popliteal Vein: No evidence of thrombus. Normal compressibility, respiratory phasicity and response to augmentation.  Calf Veins: Calf veins were poorly visualized on grayscale imaging, but were without evidence of thrombus on color Doppler evaluation  Superficial Great Saphenous Vein: No evidence of thrombus. Normal compressibility.  Venous Reflux:  None.  Other Findings: Soft tissue swelling in the left-greater-than-right lower extremity.  IMPRESSION: No evidence of deep venous thrombosis in either lower extremity.  Electronically Signed   By: Lyndall Gore M.D.   On: 07/17/2022 15:43 CT Angio Chest PE W/Cm &/Or Wo Cm CLINICAL DATA:  Pulmonary embolism suspected, high probability  EXAM: CT ANGIOGRAPHY CHEST WITH CONTRAST  TECHNIQUE: Multidetector CT imaging of the chest was performed using the standard protocol during bolus administration of intravenous contrast. Multiplanar CT image reconstructions and MIPs were obtained to evaluate the vascular anatomy.  RADIATION DOSE REDUCTION: This exam was performed according to the departmental  dose-optimization program which includes automated exposure control, adjustment of the mA and/or kV according to patient size and/or use of iterative reconstruction technique.  CONTRAST:  50mL OMNIPAQUE  IOHEXOL  350 MG/ML SOLN  COMPARISON:  None Available.  FINDINGS: Cardiovascular: Conventional 3 vessel arch anatomy. Atherosclerotic calcifications throughout the aorta and along the coronary arteries. Mild cardiomegaly. The pulmonary arteries are well opacified to the proximal segmental level. No evidence of acute pulmonary embolus. No pericardial effusion.  Mediastinum/Nodes: Unremarkable CT appearance of the thyroid  gland. No suspicious mediastinal or hilar  adenopathy. No soft tissue mediastinal mass. Small hiatal hernia.  Lungs/Pleura: Multifocal ground-glass attenuation airspace opacities and small nodules throughout the right upper lobe in a peribronchovascular distribution. Diffuse right lower lobe bronchial wall thickening with a combination of peribronchovascular patchy airspace opacities and volume loss. Similar but less impressive findings in the left lower lobe. Mild background of centrilobular pulmonary emphysema. No pleural effusion or pneumothorax.  Upper Abdomen: Submucosal edema at the hepatic flexure and proximal ascending colon. Inflammatory stranding is present in the pericolonic fat. The liver demonstrates a cirrhotic morphology with faint nodularity and hypertrophy of the left lobe relative to the right. The gallbladder is surgically absent.  Musculoskeletal: No chest wall abnormality. No acute or significant osseous findings. Epidural spinal stimulator in place.  Review of the MIP images confirms the above findings.  IMPRESSION: 1. CT findings are most consistent with multi lobar pneumonia involving the right upper, right lower and, to a lesser extent, the left lower lobes. 2. Negative for acute pulmonary embolus. 3. Additionally, the visualized hepatic flexure and proximal transverse colon are abnormal with diffuse wall thickening and pericolonic inflammatory stranding. Findings suggest active infectious/inflammatory colitis. 4. Aortic and coronary artery atherosclerotic vascular calcifications. 5. Mild centrilobular pulmonary emphysema. 6. Small hiatal hernia.  Aortic Atherosclerosis (ICD10-I70.0) and Emphysema (ICD10-J43.9).  Electronically Signed   By: Wilkie Lent M.D.   On: 07/17/2022 08:19 DG Chest Port 1 View CLINICAL DATA:  Questionable sepsis  EXAM: PORTABLE CHEST 1 VIEW  COMPARISON:  01/27/2019  FINDINGS: Low volume chest with interstitial crowding at the bases. No edema, effusion,  or pneumothorax. Normal heart size and mediastinal contours for technique. Spinal cord stimulator the lower thoracic level.  IMPRESSION: Low volume chest with atelectatic type density at the bases. Early bronchopneumonia is not excluded.  Electronically Signed   By: Dorn Roulette M.D.   On: 07/17/2022 05:50 Note: Reviewed        Physical Exam  Vitals: BP 130/66   Pulse 71   Temp (!) 97.5 F (36.4 C)   Resp 18   Ht 6' (1.829 m)   Wt 198 lb (89.8 kg)   SpO2 92%   BMI 26.85 kg/m  BMI: Estimated body mass index is 26.85 kg/m as calculated from the following:   Height as of this encounter: 6' (1.829 m).   Weight as of this encounter: 198 lb (89.8 kg). Ideal: Ideal body weight: 77.6 kg (171 lb 1.2 oz) Adjusted ideal body weight: 82.5 kg (181 lb 13.5 oz) General appearance: Well nourished, well developed, and well hydrated. In no apparent acute distress Mental status: Alert, oriented x 3 (person, place, & time)       Respiratory: No evidence of acute respiratory distress Eyes: PERLA  Musculoskeletal: left hip pain Assessment   Diagnosis Status  1. Chronic lower extremity pain (1ry area of Pain) (Left)   2. Chronic low back pain (2ry area of Pain) (Bilateral) (ML) (L>R) w/ sciatica (Left)   3.  Lumbosacral radiculopathy at L5 (Left)   4. Failed back surgical syndrome   5. Chronic pain syndrome   6. Pharmacologic therapy   7. Chronic use of opiate for therapeutic purpose   8. Encounter for medication management    Controlled Controlled Controlled   Updated Problems: Problem  Depressive Disorder  Depression, Unspecified  Legal Blindness, As Defined in [place]   Legal Blindness  Mixed Conductive and Sensorineural Hearing Loss, Unilateral, Right Ear With Restricted Hearing On The Contralateral Side  Tobacco User  Gout  Low Back Pain   Aug 30, 2022 Entered By: LUCIENNE DERINDA RAMAN Comment: spinal cord stimulator     Plan of Care  Problem-specific:  Assessment and Plan     Chronic pain syndrome with failed back surgery and spinal cord stimulator management Chronic pain syndrome with failed back surgery, managed with SCS. Difficulty maintaining SCS charge due to forgetfulness and inability to sit still, reducing efficacy. Left hip pain radiating to toes, new right ankle pain. Considered non-rechargeable [employer] battery due to charging issues. - Continue oxycodone  every six hours as needed. - Renewed Narcan  nasal spray for opioid safety monitoring.  Pharmacologic therapy for chronic pain including long-term opioid management and opioid safety monitoring Long-term opioid management with oxycodone  every six hours. Methadone  previously used but discontinued. Narcan  nasal spray used for opioid safety monitoring. No significant side effects from oxycodone  reported. - Continue oxycodone  every six hours as needed. - Renewed Narcan  nasal spray for opioid safety monitoring.  Patient's pain is controlled with oxycodone , will continue on current medication regimen.  Prescribing drug monitoring (PDMP) reviewed, findings consistent with the use of prescribed medication and no evidence of narcotic misuse or abuse.  No other side effects or adverse reaction reported to current pain medication regimen.  Schedule follow-up in 90 days for medication management.      Chase Mendez has a current medication list which includes the following long-term medication(s): allopurinol , amlodipine , atorvastatin , famotidine , gabapentin , isosorbide  mononitrate, levothyroxine , losartan , mirabegron  er, testosterone  cypionate, naloxone , [START ON 03/02/2024] oxycodone  hcl, [START ON 04/01/2024] oxycodone  hcl, [START ON 05/01/2024] oxycodone  hcl, sertraline , and [DISCONTINUED] diphenhydramine.  Pharmacotherapy (Medications Ordered): Meds ordered this encounter  Medications   Oxycodone  HCl 10 MG TABS    Sig: Take 1 tablet (10 mg total) by mouth every 6 (six) hours as needed. Must last 30 days.     Dispense:  120 tablet    Refill:  0    DO NOT: delete (not duplicate); no partial-fill (will deny script to complete), no refill request (F/U required). DISPENSE: 1 day early if closed on fill date. WARN: No CNS-depressants within 8 hrs of med.   Oxycodone  HCl 10 MG TABS    Sig: Take 1 tablet (10 mg total) by mouth every 6 (six) hours as needed. Must last 30 days.    Dispense:  120 tablet    Refill:  0    DO NOT: delete (not duplicate); no partial-fill (will deny script to complete), no refill request (F/U required). DISPENSE: 1 day early if closed on fill date. WARN: No CNS-depressants within 8 hrs of med.   Oxycodone  HCl 10 MG TABS    Sig: Take 1 tablet (10 mg total) by mouth every 6 (six) hours as needed. Must last 30 days.    Dispense:  120 tablet    Refill:  0    DO NOT: delete (not duplicate); no partial-fill (will deny script to complete), no refill request (F/U required). DISPENSE: 1 day early if  closed on fill date. WARN: No CNS-depressants within 8 hrs of med.   naloxone  (NARCAN ) nasal spray 4 mg/0.1 mL    Sig: Place 1 spray into the nose as needed (for opioid-induced respiratory depresssion). In case of emergency (overdose), spray once into each nostril. If no response within 3 minutes, repeat application and call 911.    Dispense:  1 each    Refill:  0    Instruct patient in proper use of device.   Orders:  No orders of the defined types were placed in this encounter.       Return in about 3 months (around 05/19/2024) for (F2F), (MM), Emmy Blanch NP.    Recent Visits Date Type Provider Dept  11/26/23 Office Visit Bartosz Luginbill K, NP [facility]  Showing recent visits within past 90 days and meeting all other requirements Today's Visits Date Type Provider Dept  02/19/24 Office Visit Jaiyana Canale K, NP [facility]  Showing today's visits and meeting all other requirements Future Appointments Date Type Provider Dept  05/12/24 Appointment Donye Campanelli K,  NP [facility]  Showing future appointments within next 90 days and meeting all other requirements  I discussed the assessment and treatment plan with the patient. The patient was provided an opportunity to ask questions and all were answered. The patient agreed with the plan and demonstrated an understanding of the instructions.  Patient advised to call back or seek an in-person evaluation if the symptoms or condition worsens.  I personally spent a total of 30 minutes in the care of the patient today including preparing to see the patient, getting/reviewing separately obtained history, performing a medically appropriate exam/evaluation, counseling and educating, placing orders, referring and communicating with other health care professionals, documenting clinical information in the EHR, independently interpreting results, communicating results, and coordinating care.  Note by: Maebel Marasco K Teiara Baria, NP (TTS and AI technology used. I apologize for any typographical errors that were not detected and corrected.) Date: 02/19/2024; Time: 1:40 PM

## 2024-02-19 ENCOUNTER — Ambulatory Visit: Payer: PRIVATE HEALTH INSURANCE | Attending: Nurse Practitioner | Admitting: Nurse Practitioner

## 2024-02-19 ENCOUNTER — Encounter: Payer: Self-pay | Admitting: Nurse Practitioner

## 2024-02-19 DIAGNOSIS — M961 Postlaminectomy syndrome, not elsewhere classified: Secondary | ICD-10-CM | POA: Insufficient documentation

## 2024-02-19 DIAGNOSIS — Z79891 Long term (current) use of opiate analgesic: Secondary | ICD-10-CM | POA: Diagnosis present

## 2024-02-19 DIAGNOSIS — F32A Depression, unspecified: Secondary | ICD-10-CM | POA: Insufficient documentation

## 2024-02-19 DIAGNOSIS — M5442 Lumbago with sciatica, left side: Secondary | ICD-10-CM | POA: Insufficient documentation

## 2024-02-19 DIAGNOSIS — Z79899 Other long term (current) drug therapy: Secondary | ICD-10-CM | POA: Diagnosis present

## 2024-02-19 DIAGNOSIS — M79605 Pain in left leg: Secondary | ICD-10-CM | POA: Diagnosis present

## 2024-02-19 DIAGNOSIS — H90A31 Mixed conductive and sensorineural hearing loss, unilateral, right ear with restricted hearing on the contralateral side: Secondary | ICD-10-CM | POA: Insufficient documentation

## 2024-02-19 DIAGNOSIS — M545 Low back pain, unspecified: Secondary | ICD-10-CM | POA: Insufficient documentation

## 2024-02-19 DIAGNOSIS — G894 Chronic pain syndrome: Secondary | ICD-10-CM | POA: Diagnosis present

## 2024-02-19 DIAGNOSIS — M109 Gout, unspecified: Secondary | ICD-10-CM | POA: Insufficient documentation

## 2024-02-19 DIAGNOSIS — M5417 Radiculopathy, lumbosacral region: Secondary | ICD-10-CM | POA: Diagnosis present

## 2024-02-19 DIAGNOSIS — G8929 Other chronic pain: Secondary | ICD-10-CM | POA: Insufficient documentation

## 2024-02-19 DIAGNOSIS — H548 Legal blindness, as defined in USA: Secondary | ICD-10-CM | POA: Insufficient documentation

## 2024-02-19 DIAGNOSIS — Z720 Tobacco use: Secondary | ICD-10-CM | POA: Insufficient documentation

## 2024-02-19 MED ORDER — OXYCODONE HCL 10 MG PO TABS: 10.0000 mg | ORAL_TABLET | Freq: Four times a day (QID) | ORAL | 0 refills | Status: AC | PRN

## 2024-02-19 MED ORDER — NALOXONE HCL 4 MG/0.1ML NA LIQD: 1.0000 | NASAL | 0 refills | Status: AC | PRN

## 2024-02-19 NOTE — Progress Notes (Signed)
 Nursing Pain Medication Assessment:  Safety precautions to be maintained throughout the outpatient stay will include: orient to surroundings, keep bed in low position, maintain call bell within reach at all times, provide assistance with transfer out of bed and ambulation.  Medication Inspection Compliance: Pill count conducted under aseptic conditions, in front of the patient. Neither the pills nor the bottle was removed from the patient's sight at any time. Once count was completed pills were immediately returned to the patient in their original bottle.  Medication: Oxycodone  IR Pill/Patch Count: 76 of 120 pills/patches remain Pill/Patch Appearance: Markings consistent with prescribed medication Bottle Appearance: Standard pharmacy container. Clearly labeled. Filled Date: 25 / 26 / 2025 Last Medication intake:  Today

## 2024-02-19 NOTE — Patient Instructions (Signed)
 " ______________________________________________________________________    Opioid Pain Medication Update  To: All patients taking opioid pain medications. (I.e.: hydrocodone, hydromorphone, oxycodone , oxymorphone, morphine , codeine, methadone , tapentadol, tramadol , buprenorphine, fentanyl , etc.)  Re: Updated review of side effects and adverse reactions of opioid analgesics, as well as new information about long term effects of this class of medications.  Direct risks of long-term opioid therapy are not limited to opioid addiction and overdose. Potential medical risks include serious fractures, breathing problems during sleep, hyperalgesia, immunosuppression, chronic constipation, bowel obstruction, myocardial infarction, and tooth decay secondary to xerostomia.  Unpredictable adverse effects that can occur even if you take your medication correctly: Cognitive impairment, respiratory depression, and death. Most people think that if they take their medication correctly, and as instructed, that they will be safe. Nothing could be farther from the truth. In reality, a significant amount of recorded deaths associated with the use of opioids has occurred in individuals that had taken the medication for a long time, and were taking their medication correctly. The following are examples of how this can happen: Patient taking his/her medication for a long time, as instructed, without any side effects, is given a certain antibiotic or another unrelated medication, which in turn triggers a Drug-to-drug interaction leading to disorientation, cognitive impairment, impaired reflexes, respiratory depression or an untoward event leading to serious bodily harm or injury, including death.  Patient taking his/her medication for a long time, as instructed, without any side effects, develops an acute impairment of liver and/or kidney function. This will lead to a rapid inability of the body to breakdown and eliminate  their pain medication, which will result in effects similar to an overdose, but with the same medicine and dose that they had always taken. This again may lead to disorientation, cognitive impairment, impaired reflexes, respiratory depression or an untoward event leading to serious bodily harm or injury, including death.  A similar problem will occur with patients as they grow older and their liver and kidney function begins to decrease as part of the aging process.  Background information: Historically, the original case for using long-term opioid therapy to treat chronic noncancer pain was based on safety assumptions that subsequent experience has called into question. In 1996, the American Pain Society and the American Academy of Pain Medicine issued a consensus statement supporting long-term opioid therapy. This statement acknowledged the dangers of opioid prescribing but concluded that the risk for addiction was low; respiratory depression induced by opioids was short-lived, occurred mainly in opioid-naive patients, and was antagonized by pain; tolerance was not a common problem; and efforts to control diversion should not constrain opioid prescribing. This has now proven to be wrong. Experience regarding the risks for opioid addiction, misuse, and overdose in community practice has failed to support these assumptions.  According to the Centers for Disease Control and Prevention, fatal overdoses involving opioid analgesics have increased sharply over the past decade. Currently, more than 96,700 people die from drug overdoses every year. Opioids are a factor in 7 out of every 10 overdose deaths. Deaths from drug overdose have surpassed motor vehicle accidents as the leading cause of death for individuals between the ages of 47 and 84.  Clinical data suggest that neuroendocrine dysfunction may be very common in both men and women, potentially causing hypogonadism, erectile dysfunction, infertility,  decreased libido, osteoporosis, and depression. Recent studies linked higher opioid dose to increased opioid-related mortality. Controlled observational studies reported that long-term opioid therapy may be associated with increased risk for cardiovascular events.  Subsequent meta-analysis concluded that the safety of long-term opioid therapy in elderly patients has not been proven.   Side Effects and adverse reactions: Common side effects: Drowsiness (sedation). Dizziness. Nausea and vomiting. Constipation. Physical dependence -- Dependence often manifests with withdrawal symptoms when opioids are discontinued or decreased. Tolerance -- As you take repeated doses of opioids, you require increased medication to experience the same effect of pain relief. Respiratory depression -- This can occur in healthy people, especially with higher doses. However, people with COPD, asthma or other lung conditions may be even more susceptible to fatal respiratory impairment.  Uncommon side effects: An increased sensitivity to feeling pain and extreme response to pain (hyperalgesia). Chronic use of opioids can lead to this. Delayed gastric emptying (the process by which the contents of your stomach are moved into your small intestine). Muscle rigidity. Immune system and hormonal dysfunction. Quick, involuntary muscle jerks (myoclonus). Arrhythmia. Itchy skin (pruritus). Dry mouth (xerostomia).  Long-term side effects: Chronic constipation. Sleep-disordered breathing (SDB). Increased risk of bone fractures. Hypothalamic-pituitary-adrenal dysregulation. Increased risk of overdose.  RISKS: Respiratory depression and death: Opioids increase the risk of respiratory depression and death.  Drug-to-drug interactions: Opioids are relatively contraindicated in combination with benzodiazepines, sleep inducers, and other central nervous system depressants. Other classes of medications (i.e.: certain antibiotics  and even over-the-counter medications) may also trigger or induce respiratory depression in some patients.  Medical conditions: Patients with pre-existing respiratory problems are at higher risk of respiratory failure and/or depression when in combination with opioid analgesics. Opioids are relatively contraindicated in some medical conditions such as central sleep apnea.   Fractures and Falls:  Opioids increase the risk and incidence of falls. This is of particular importance in elderly patients.  Endocrine System:  Long-term administration is associated with endocrine abnormalities (endocrinopathies). (Also known as Opioid-induced Endocrinopathy) Influences on both the hypothalamic-pituitary-adrenal axis?and the hypothalamic-pituitary-gonadal axis have been demonstrated with consequent hypogonadism and adrenal insufficiency in both sexes. Hypogonadism and decreased levels of dehydroepiandrosterone sulfate have been reported in men and women. Endocrine effects include: Amenorrhoea in women (abnormal absence of menstruation) Reduced libido in both sexes Decreased sexual function Erectile dysfunction in men Hypogonadisms (decreased testicular function with shrinkage of testicles) Infertility Depression and fatigue Loss of muscle mass Anxiety Depression Immune suppression Hyperalgesia Weight gain Anemia Osteoporosis Patients (particularly women of childbearing age) should avoid opioids. There is insufficient evidence to recommend routine monitoring of asymptomatic patients taking opioids in the long-term for hormonal deficiencies.  Immune System: Human studies have demonstrated that opioids have an immunomodulating effect. These effects are mediated via opioid receptors both on immune effector cells and in the central nervous system. Opioids have been demonstrated to have adverse effects on antimicrobial response and anti-tumour surveillance. Buprenorphine has been demonstrated to have  no impact on immune function.  Opioid Induced Hyperalgesia: Human studies have demonstrated that prolonged use of opioids can lead to a state of abnormal pain sensitivity, sometimes called opioid induced hyperalgesia (OIH). Opioid induced hyperalgesia is not usually seen in the absence of tolerance to opioid analgesia. Clinically, hyperalgesia may be diagnosed if the patient on long-term opioid therapy presents with increased pain. This might be qualitatively and anatomically distinct from pain related to disease progression or to breakthrough pain resulting from development of opioid tolerance. Pain associated with hyperalgesia tends to be more diffuse than the pre-existing pain and less defined in quality. Management of opioid induced hyperalgesia requires opioid dose reduction.  Cancer: Chronic opioid therapy has been associated with an increased  risk of cancer among noncancer patients with chronic pain. This association was more evident in chronic strong opioid users. Chronic opioid consumption causes significant pathological changes in the small intestine and colon. Epidemiological studies have found that there is a link between opium  dependence and initiation of gastrointestinal cancers. Cancer is the second leading cause of death after cardiovascular disease. Chronic use of opioids can cause multiple conditions such as GERD, immunosuppression and renal damage as well as carcinogenic effects, which are associated with the incidence of cancers.   Mortality: Long-term opioid use has been associated with increased mortality among patients with chronic non-cancer pain (CNCP).  Prescription of long-acting opioids for chronic noncancer pain was associated with a significantly increased risk of all-cause mortality, including deaths from causes other than overdose.  Reference: Von Korff M, Kolodny A, Deyo RA, Chou R. Long-term opioid therapy reconsidered. Ann Intern Med. 2011 Sep 6;155(5):325-8. doi:  10.7326/[id number]. PMID: 78106373; PMCID: [id number]. Kit JINNY Laurence CINDERELLA Pearley JINNY, Hayward RA, Dunn KM, Jordan KP. Risk of adverse events in patients prescribed long-term opioids: A cohort study in the [place] Clinical Practice Research [employer]. Eur J Pain. 2019 May;23(5):908-922. doi: 10.1002/ejp.1357. Epub 2019 Jan 31. PMID: 69379883. Colameco S, Coren JS, Ciervo CA. Continuous opioid treatment for chronic noncancer pain: a time for moderation in prescribing. Postgrad Med. 2009 Jul;121(4):61-6. doi: 10.3810/pgm.2009.07.2032. PMID: 80358728. Gigi JONELLE Shlomo MILUS Levern IVER Conny RN, [place], Blazina I, Lonell DASEN, Bougatsos C, Deyo RA. The effectiveness and risks of long-term opioid therapy for chronic pain: a systematic review for a [employer] of [employer] Pathways to [employer]. Ann Intern Med. 2015 Feb 17;162(4):276-86. doi: 10.7326/M14-2559. PMID: 74418742. Rory CHRISTELLA Laurence [facility], [place] DM. NCHS Data Brief No. 22. [place]: Centers for Disease Control and Prevention; 2009. Sep, Increase in Fatal Poisonings Involving Opioid Analgesics in the [place] , 1999-2006. Song IA, Choi HR, Oh TK. Long-term opioid use and mortality in patients with chronic non-cancer pain: Ten-year follow-up study in [place] from 2010 through 2019. EClinicalMedicine. 2022 Jul 18;51:101558. doi: 10.1016/j.eclinm.[id number]. PMID: 64124182; PMCID: [id number]. Huser, W., Schubert, T., Vogelmann, T. et al. All-cause mortality in patients with long-term opioid therapy compared with non-opioid analgesics for chronic non-cancer pain: a database study. BMC Med 18, 162 (2020). [email or web address].info/ Rashidian H, Zendehdel K, Kamangar F, Malekzadeh R, Haghdoost AA. An Ecological Study of the Association between Opiate Use and Incidence of Cancers. Addict Health. 2016 Fall;8(4):252-260. PMID: 71180443; PMCID: [id number].  Our Goal: Our goal is to control your pain with means other  than the use of opioid pain medications.  Our Recommendation: Talk to your physician about coming off of these medications. We can assist you with the tapering down and stopping these medicines. Based on the new information, even if you cannot completely stop the medication, a decrease in the dose may be associated with a lesser risk. Ask for other means of controlling the pain. Decrease or eliminate those factors that significantly contribute to your pain such as smoking, obesity, and a diet heavily tilted towards inflammatory nutrients.  Last Updated: 08/14/2022   ______________________________________________________________________       ______________________________________________________________________    Update on Controlled Substance (Opioid) Regulations   To: All patients taking opioid pain medications. (I.e.: hydrocodone, hydromorphone, oxycodone , oxymorphone, morphine , codeine, methadone , tapentadol, tramadol , buprenorphine, fentanyl , etc.)  Re: Review on the state of controlled substance regulations.  Introduction: Rules and regulations associated with all aspects of controlled substances are constantly being modified. Unfortunately we have encountered patients questioning the veracity of the  information that we provide them about these changes. This is intended to provide them with appropriate references and a historical review of these changes.  A Brief History: As of November 22, 2015, the [place]  Government declared the opioid epidemic a public health emergency. Prescription drug monitoring programs (PDMPs) and the [facility] All Schedules Prescription Electronic Reporting Act (NASPER). Before 1800, clinicians regarded pain as an existential phenomenon, a consequence of aging. There was no regulation on the use of cocaine and opioids, resulting in widespread marketing and prescribing for many ailments ranging from diarrhea to toothache. The [employer] of  [phone], passed in response to the sudden emergence of street heroin abuse as well as iatrogenic morphine  dependence, influenced both physician and patient alike to avoid opiates. Patients with unexplained pain in the 1920s were regarded as deluded, malingering, or abusers, and cancer patients through the 1950s were encouraged to wean themselves off opioids until their lives could be measured in weeks. Alongside this opioid evolution, the American Pain Society launched their influential pain as the fifth vital sign campaign in 1995. Concurrently, pharmaceutical companies introduced new formulations, such as extended release oxycodone  (OxyContin ). From 1997 to 2002, OxyContin  prescriptions increased from 670,000 to 6.2 million. However, concerns soon began to surface regarding overzealous opioid treatment. It must be noted that pharmaceutical companies contributed significantly to the rise of the opioid epidemic, receiving considerable reprimands as a consequence. In 2007, as the opioid epidemic began to inflict profound damage, [employer] pleaded guilty to federal charges related to the misbranding of OxyContin . [employer] agreed to pay a total of $634.5 million to resolve Justice Department investigations, as well as a $19.5 million settlement to [address] and the [address] of [place].  In response to the current epidemic, changes in focus to the development of new abuse deterrent opioid formulations at the [place]  Food and Drug Administration (FDA) as well as drafting of new public standards for pain treatment were created at [facility] in 2017. In response to the opioid epidemic, FDA public policy changes were announced in February 2016. Among these new positions were a re-examination of the risk-benefit paradigm for opioids with strict emphasis on the large public health ramifications. The various modified opioids released over the past 20 years, such as tamper-resistant preparation, have had differing levels of success,  and are collectively referred to as Risk Evaluation and Mitigation Strategies (REMS). There is also a growing focus on preventing opioid use disorder (OUD) and on offering affected individuals accessible and effective treatment. [place]  government policy reflects these changes and both the Affordable Care Act and the Mental Health Parity and Addiction Equity Act were major steps forward in treating opioid addiction. The Affordable Care Act, which was signed into law in 2010, with major provisions coming into effect by 2014.  In the 1990s, the intensified marketing of newly reformulated prescription opioid medications (e.g., OxyContin ) and an influential pain advocacy campaign that encouraged greater pain management led to a precipitous rise in opioid use in the [place] . Research from the Centers for Disease Control and Prevention (CDC) shows that prescription opioid sales in the [place]  quadrupled from 1999 to 2010. At the same time, opioid misuse and opioid-involved overdose deaths increased (Figure 1). Between 1999 and 2010, the rate of opioidinvolved overdose deaths in the [place]  doubled from 2.9 to 6.8 deaths per 100,000 people. This initial rise in opioid-related deaths is often referred to as the first wave of the recent opioid crisis.  Between 1999 and 2020,  565,000 Americans died of opioid-involved overdoses. In turn, federal, state, and local governments responded with various legal and policy efforts to curb opioid misuse and drug-related overdose Deaths.  Recent Congresses have enacted several laws addressing the opioid crisis, such as the Comprehensive Addiction and Recovery Act of 2016 (CARA, P.L. 114-198); the 21st Century Cures Act (P.L. 114-255); the Substance UseDisorder Prevention that Promotes Opioid Recovery and Treatment for Patients and Communities Act (SUPPORT Act, P.L. [phone]); the Fentanyl  Sanctions Act (Title LXXII of P.L. A1944156); and the Blocking  Deadly Fentanyl  Imports Act (P.L. 117-81, 6610). These laws addressed overprescribing and misuse of opioids, expanded substance use disorder prevention and treatment capacities, bolstered drug diversion capabilities, and enhanced international drug interdiction, counternarcotics cooperation, and sanctions efforts. Congress also directed additional funds to many of these initiatives through appropriations.  Congress provided funding in the [employer] Act of 2021 [phone]; P.L. 117-2) for syringe services programs (often known as needle exchange programs) and other harm reduction initiatives. Federal and state harm reduction strategies have frequently involved the distribution of naloxone  (e.g., Narcan )--a medication used to reverse an opioid overdose--and test strips used to detect fentanyl  in drug samples.  The Department of Justice (DOJ) and Department of Homeland Security [facility]) aim to reduce the diversion of prescription opioids and the use, manufacturing, and trafficking of illicit opioids. DOJ--via the Drug Enforcement Administration (DEA)--regulates opioid manufacturers, distributors, and dispensers; it also controls the opioid supply through enforcement of regulatory requirements.  A History of Opiate Laws in the [place]   Prior to 1890, laws concerning opiates were strictly imposed on a local city or state-by-state basis. One of the first was in [place] in 1875 where it became illegal to smoke opium  only in opium  dens. It did not ban the sale, import or use otherwise. In the next 25 years different states enacted opium  laws ranging from outlawing opium  dens altogether to making possession of opium , morphine  and heroin without a physicians prescription illegal.  The first Congressional Act took place in 1890 that levied taxes on morphine  and opium . From that time on the [employer] has had a series of laws and acts directly aimed at opiate use, abuse  and control. These are outlined below:  1906 - Pure Food and Drug Act Preventing the manufacture, sale, or transportation of adulterated or misbranded or poisonous or deleterious foods, drugs, medicines, and liquors, and for regulating traffic therein, and for other purposes. Punishment included fines and prison time.  1909   - Smoking Opium  Exclusion Act Banned the importation, possession and use of smoking opium . Did not regulate opium -based medications. First Freight forwarder banning the non-medical use of a substance.  1914  - The Margrette Act In summary, The Margrette Act of 1914 was written more to have all parties involved in importing, exporting, set designer and distributing opium  or cocaine to register with the [employer] and have taxes levied upon them. Exempt from the law were physicians operating in the course of his professional practice  1919 - Supreme Court ratified the Bj's in [place] et al., v. [place]  and [place]  v. [place], then again in [facility] v. [place] , in 1920, holding that doctors may not prescribe maintenance supplies of narcotics to people addicted to narcotics. However, it does not prohibit doctors from prescribing narcotics to wean a patient off of the drug. It was also the opinion of the court that prescribing narcotics to habitual users was not considered professional practice hence  it then was considered illegal for doctors to prescribe opioids for the purposes of maintaining an addiction. It can be argued that todays addiction medications are not intended to maintain an addiction but to facilitate addiction remission. In which case, this opinion of the court should not preclude practitioners from prescribing buprenorphine or methadone  to patients suffering from an addictive disorder.  1924  - Heroin Act Architectural technologist, importation and possession of heroin illegal - even for medicinal use.  1922 -- Narcotic  Drug Import and Export Act Enacted to assure proper control of importation, sale, possession, production and consumption of narcotics.  1927  -- Special Educational Needs Teacher of [employer] of Prohibition was responsible for tracking bootleggers and organized conservation officer, historic buildings. They focused primarily on interstate and international cases and those cases where local law enforcement official would not or could not act.  1932 -- Uniform State Narcotic Act Encouraged states to pass uniform state laws matching the federal Narcotic Drug Import and Export Act. Suggested prohibiting cannabis use at the state level.  21 -- Food, Drug, and Cosmetic Act The new law brought cosmetics and medical devices under control, and it required that drugs be labeled with adequate directions for safe use. Moreover, it mandated pre-market approval of all new drugs, such that a manufacturer would have to prove to FDA that a drug were safe before it could be sold  1951 -- Boggs Act Imposed maximum criminal penalties for violations of the import/export and internal revenue laws related to drugs and also established mandatory minimum prison sentences.  1956 -- Narcotics Control Act Increased Boggs Act penalties and mandatory prison sentence minimums for violations of existing drug laws.  1965 -- Drug Abuse Control Amendment Enacted to deal with problems caused by abuse of depressants, stimulants and hallucinogens. Restricted research into psychoactive drugs such as LSD by requiring FDA approval.  1970 -- Controlled Substance Act  Controlled Substances Import and Export Act These laws are a consolidation of numerous laws regulating the manufacture and distribution of narcotics, stimulants, depressants, hallucinogens, anabolic steroids, and chemicals used in the illicit production of controlled substances. The CSA places all substances that are regulated under existing federal law into one of five schedules. This placement is based upon  the substance's medicinal value, harmfulness, and potential for abuse or addiction. Schedule I is reserved for the most dangerous drugs that have no recognized medical use, while Schedule V is the classification used for the least dangerous drugs. The act also provides a mechanism for substances to be controlled, added to a schedule, decontrolled, removed from control, rescheduled, or transferred from one schedule to another.  49 - Drug Enforcement Agency By Executive Order, the DEA was formed to take place of the [employer] of Narcotics and Dangerous Drugs.  42 -Narcotic Addict Treatment Act of  1974  - Public Law [phone] Amends the Controlled Substance Act of 1970 to provide for the registration of practitioners conducting narcotic treatment programs. [methadone  clinics] It also provides legal definitions for the phrases maintenance treatment and detoxification treatment.  1986 -- Anti-Drug Abuse Act of 1986 Strengthened Federal efforts to encourage foreign cooperation in eradicating illicit drug crops and in halting international drug traffic, to improve enforcement of Federal drug laws and enhance interdiction of illicit drug shipments, to provide strong Market researcher in establishing effective drug abuse prevention and education programs, to [employer] support for drug abuse treatment and rehabilitation efforts, and for other purposes. It also re-imposed mandatory sentencing minimums depending on which drug and  how much was involved.  1988 -- Anti-Drug Abuse Act of 1988 Established the Office of Materials Engineer (ONDCP) in the [employer] of the Economist; authorized funds for [employer], state and local drug enforcement activities, school-based drug prevention efforts, and drug abuse treatment with special emphasis on injecting drug abusers at high risk for AIDS.  2000 -- Federal - The Drug Addiction Treatment Act of 2000 (DATA 2000) It enables qualified physicians to  prescribe and/or dispense narcotics for the purpose of treating opioid dependency. For the first time, physicians are able to treat this disease from their private offices or other clinical settings. This presents a very desirable treatment option for those who are unwilling or unable to seek help in drug treatment clinics. Patients can now be treated in the privacy of their doctors office, as are other people being treated for any other type of medical condition. One medicine doctors may now prescribe is Buprenorphine. The major downfall of this Act is the limitation of 30 patients per practice - which means that large facilities, no matter how many physicians are there, can only treat 30 patients at a time.  2002-- DEA reschedules buprenorphine from a schedule V drug to a schedule III drug, on November 12, 2000 - the day before the FDA approval of Suboxone and Subutex despite overwhelming objection by the medical community.  2004: June 2004 THE CONFIDENTIALITY OF ALCOHOL AND DRUG ABUSE PATIENT RECORDS REGULATION AND THE HIPAA PRIVACY RULE:  Confidentiality of Alcohol and Drug Dependence Patient Records (summary) Code of Federal Regulations Title 42 Part 2 (42 CFR Part 2)  The confidentiality of alcohol and drug dependence patient records maintained by this practice/program is protected by federal law and regulations. Generally, the practice/program may not say to a person outside the practice/program that a patient attends the practice/program, or disclose any information identifying a patient as being alcohol or drug dependent unless:  The patient consents in writing; The disclosure is allowed by a court order, or The disclosure is made to medical personnel in a medical emergency or to qualified personnel for research,  audit, or practice/program evaluation. Violation of the federal law and regulations by a practice/program is a crime. Suspected violations may be reported to appropriate authorities  in accordance with federal regulations. Freight forwarder and regulations do not protect any information about a crime committed by a patient either at the practice/program or against any person who works for the practice/program or about any threat to commit such a crime. Federal laws and regulations do not protect any information about suspected child abuse or neglect from being reported under state law to appropriate state or local authorities.  sample consent form (MS-WORD)  2005: 09-08-2003 Public law [phone], Amends the Controlled Substances Act to eliminate the 30-patient limit for medical group practices allowed to dispense narcotic drugs in schedules III, IV, or V for maintenance or detoxification treatment (retains the 30-patient limit for an individual physician). This amendment removes the 30-patient limit on group medical practices that treat opioid dependence with buprenorphine. The restriction was part of the original Drug Addiction Treatment Act of 2000 (DATA) that allowed treatment of opioid dependence in a doctor's office. With this change, every certified doctor may now prescribe buprenorphine up to his or her individual physician limit of 30 patients.  2006: On 02/03/2005 President Levy signed Bill H.R.6344 into law. This allows physicians who have been certified to prescribe certain drugs for the treatment of opioid dependence under DATA2000 to treat up  to 100 patients (up from 30) by submitting an intent notification to the Dept of Health and [employer]. This is a major step forward in both fighting the stigma and allowing access to treatment previously not available to some. For more details see 30/100-PATIENT LIMIT  2016: HHS augments regulations concerning the 30/100 patient limit by raising the limit to 275 for qualifying physicians. Link to summary of regulation  2016: Comprehensive Addiction and Recovery Act of 2016 (sec.303) amends the Controlled Substance Act - to allow Nurse  Practitioners and Physician Assistants to become eligible to prescribe buprenorphine for the treatment of opioid use disorder. See the entire law for more details.  The roots of the concurrent regulation of certain drugs under two statutory schemes go back to the beginning of this century. In 1906, Congress enacted the Pure Food and Drug Act, establishing one regime of regulation to assure (among other things) that drugs were not adulterated or misbranded. These regulations were amended several times, recodified in 1938, and expanded on again from the 1940s through the 1990s. Their implementation and enforcement is today assigned to the Food and Drug Administration (FDA) in the Department of Health and Human Services [facility]).  In 1914, Congress adopted the [place] Narcotic Act to stop abuse of addictive drugs. The [place] Narcotic Act was amended in 1937 to include marijuana. In 1965, amphetamines, barbiturates, and hallucinogens came under regulation, but under the [employer], Drug, and Cosmetic Act. In 1970, these various statutes were consolidated and recodified as the Controlled Substances Act (CSA), which has been amended several times since then. Its implementation and enforcement is today assigned to the Drug Enforcement Administration (DEA) in the Department of Justice.  The first clash occurred after World War I, when so-called morphine  clinics existed and physicians prescribed or dispensed morphine  to addicts. Some addicts were veterans of the American Civil War, the Spanish-American War, and WWI, who had become addicted during treatment for war wounds, but most of them came from the growing population of nonmedical addicts ([place], 8017). The Narcotics Division of the Prohibition Unit of the Department of the Treasury, which was then responsible for enforcing the [facility] Narcotic Act, concluded that this activity was not the legitimate practice of medicine but simple drug trafficking. The  Treasury Department swiftly closed the clinics and made it personally and professionally risky for physicians to maintain a narcotic addict for any reason. In did so, however, only after the American Medical Association had adopted a resolution, in 1920, opposing ambulatory clinics''.  In 1972, the public health establishment, including the Secretary of Health, Education, and Welfare, the Education Officer, Environmental, the [employer] of [employer], and the Chemical Engineer for Drug Abuse Prevention, was unprepared to allow [employer] of Narcotics and Dangerous Drugs, DEA's predecessor agency, to unilaterally define the parameters of medical practice for the use of methadone  in the treatment of heroin addiction. As a consequence, a new set of rules--the third, on top of the FDA and DEA schemes--was added, one that inserted FDA deeply into the practice of medicine, notwithstanding its protestations to the contrary. Congress ratified this joint responsibility of law enforcement and public health officials for methadone  through this third set of rules in 1974 with the passage of the Narcotic Addict Treatment Act (NATA). To examine in detail the evolution of this third set of rules--commonly referred to as the FDA or DHHS methadone  regulations--we turn, first, to the period of the mid-1960s.  Increased use of heroin in  the post World War II period first became apparent in the early to mid 1950s. During the [employer], a minimum mandatory narcotics law was enacted in 1956, effective July 1957. 1962 [facility] (Ant. Matildes Brenes) conference on drug abuse, the [employer] on Narcotic and Drug Abuse (the [employer]) of 1963, the Drug Abuse Control Amendments of 1965, the President's Commission on [employer] and Administration of Justice (the [employer]) of [phone], and the Narcotic Addiction Rehabilitation Act of 1966.  The 1965 Drug Abuse Control Amendments  brought under strict federal control all nonnarcotic drugs capable of producing serious psychotoxic effects when abused. This act also created the [employer] of Drug Abuse Control within the Department of Health, Education, and Welfare (DHEW) and shifted the basis for aon corporation of illegal drugs from tax principles (administered by the Department of Treasury) to the regulation of commerce (administered by the [employer]).  The 1966 Narcotic Addiction Rehabilitation Act TOUR MANAGER) authorized the civil commitment of narcotic addicts, and federal assistance to state and local governments to develop a local system of drug treatment programs. With respect to the latter, the [employer] of Mental Health [facility]) initially proposed the gradual implementation of the state assistance effort, mainly through a common mental health mechanism--inpatient treatment programs. However, because of a perceived pressing need, the courts began to commit addicts to these programs even before they were officially opened or staffed. The NARA legislation imposed the following contract requirements on treatment centers: (1) thrice-a-week counseling sessions; (2) weekly urine tests; (3) restorative dental services; (4) psychological consultations and vocational training; and (5) the treatment modalities of drug-free outpatient, therapeutic community, and methadone  maintenance. Reorganization Plan No. 1 of 1968 transferred the primary functions of the [employer] of Narcotics (FBN) from the [employer] to the Department of Justice; it also transferred the [employer] of Drug Abuse Control functions to the Department of Justice. Within the [employer], the [employer] of Narcotics and Dangerous Drugs (BNDD) was created, which became the Drug Enforcement Administration in 1973.   Under the first [place] administration [phone]), federal drug abuse policy developed in a significant way. These developments included a 1969 war  on drugs presidential message, resulting legislation in 1970, and a Special Action Office created by executive order in 1971 and authorized in statute in 1972. Brynn, in 1969, to send a message to Congress on drug abuse. Although this was the first time that a U.S. president invoked the war on drugs image, it was in retrospect the most balanced approach to the problem of drug abuse that had been advanced. The 1969 message resulted in the submission of legislation to the Congress and the passage, the following year, of the Comprehensive Drug Abuse Prevention and Control Act of 1970 [employer] [phone], December 02, 1968). The act dealt with research, treatment, and prevention of drug abuse and drug dependence, and with drug abuse charity fundraiser. One major purpose of the 1970 legislation was to reverse some of the strictures of the [employer] of 1914. The 1970 act sought to clarify for the medical profession . . . the extent to which they may safely go in treating narcotic addicts as patients. Title I, in Section IV, charged the Surveyor, Minerals, Education, and Welfare, to determine the appropriate methods of professional practice in the medical treatment of the narcotic addiction of various classes of narcotic addicts. This provision constitutes the initial statutory basis for treatment standards. The law enforcement sections consolidated all prior federal statutes into  the Controlled Substances Act and the Controlled Substances Export and Import Act (Titles II and III, respectively, of the Comprehensive Drug Abuse Prevention and Control Act of 1970). Under this legislation, substances were classified under five schedules according to their abuse potential, and psychological and physical effects. Methadone  was placed in Schedule II, along with such opiate drugs as morphine , codeine, and hydrocodone.  One of the most important steps taken by President Brynn was to establish in June 1971 the  Special Action Office for Drug Abuse Prevention (SAODAP) in the [employer] of the President ([place] [phone], July 23, 1969). In mid-1971, [facility] appointed Dr. Maple Dunnings as SAODAP director. Within a year, the Drug Abuse Prevention Office and Treatment Act of 1972 [employer] [phone], April 27, 1970) gave statutory authority to [facility], but limiting setting, on August 05, 1973, as the limit on its existence.  The purpose of the 1972 act was to bring the resources of the federal government to bear on drug abuse with the immediate objective of significantly reducing its incidence and developing a comprehensive, coordinated long-term federal strategy to combat drug abuse.  Narcotic Addict Treatment Act [employer]) of 1974 [employer] [phone]), which amended the Controlled Substances Act. This legislation was driven by concern for the diversion of methadone  to illicit channels that was occurring in 1972 and 1973, as reflected in the title of the Senate bill adopted on July 15, 1971, the Methadone  Diversion Control Act of 1973. (U.S. Senate, 1970a, 8029a).  The 1980 final rule (45 FR 37305, October 26, 1978) reduced the minimum standard for admission from two years of addiction to one year coupled with a clinical determination that the individual was currently physiologically.  The regulations were next revised in 1989, following two proposals to modify them, one in 1983 and one in 1987.  Under President Tanda Corrente, a government-wide effort was made to review all federal government regulations and to eliminate or reduce the burden of these regulations on the private sector, state and nash-finch company, and wps resources.   The 1983 recommendations, though not adopted, did initiate another revision of the methadone  regulations, which first found expression in a 1987 proposed rule (52 FR 37047, November 07, 1985) and culminated in a final rule (54 FR 8954, April 08, 1987) at the end of the  decade. In the 1987 proposed rule, the FDA and NIDA, in an effort to put the best face on the unenthusiastic 1983 response by the provider community to converting the regulations to guidelines, indicated that they had retained the current requirements necessary to achieve the goals of the 1974 NATA, but were proposing to streamline the regulation and to promote more efficient operation of methadone  programs. The 1987 proposed rule, issued by the FDA and NIDA, advanced the following changes in the methadone  regulation: that detoxification treatment be divided into short-term (<21 days) and long-term (>21 and <180 days) treatment; that the minimum staffing ratio of one counselor to 50 patients be eliminated; that blood tests be allowed as ways to conduct initial drug screening or to meet the monthly testing requirements for six-day take-home patients; that the 72-hour notification of FDA and the pertinent state authority for methadone  doses greater than 100 mg be eliminated; that special adverse reaction reporting requirements for methadone  be eliminated and reliance placed upon general FDA reporting requirements; that a supervising counselor be allowed to conduct the annual review of the patient's treatment plan for certain qualified patients who had been in treatment for 3  years or longer; and that the requirement of an annual report of methadone  treatment programs to the FDA be dropped. The FDA and NIDA issued a final rule on April 08, 1987, based on comments on the 1987 proposed (54 FR 8954). Concurrently, FDA and NIDA issued a six-page guidance document, which noted that the regulations, over time, had recommended certain practices that were not actually required. Public Health Service, in Congress, and elsewhere, to reorganize the Alcohol, Drug Abuse, and Mental Health Administration (ADAMHA). These efforts culminated in the [employer] of 1992 [employer] [phone], August 16, 1990), the main  purpose of which was to transfer the research portions of the three ADAMHA institutes--NIDA, the [employer] Mills of Alcoholism and Alcohol Abuse, and the [employer] of Mental Health--to the [employer] and to create the Substance Abuse and Museum/gallery [facility]) as the home for the service functions of these entitles.  Guidelines for Opioid Treatment The Federal Guidelines for Opioid Treatment Programs - 2015 serve as a guide to accrediting organizations for developing accreditation standards. The guidelines also provide OTPs with information on how programs can achieve and maintain compliance with federal regulations. The 2015 guidelines are an update to the 2007 Guidelines for the Accreditation of Opioid Treatment Programs (PDF  547 KB). The new document reflects the obligation of OTPs to deliver care consistent with the patient-centered, integrated, and recovery-oriented standards of substance use treatment.  DPT oversees the certification of OTPs and provides guidance to nonprofit organizations and state governmental entities that want to become a SAMHSA-approved accrediting body. Learn more about the accreditation and certification of OTPs and [facility] oversight of OTP accreditation bodies.  Model Guidelines for [employer] With input from [facility], the Federation of [employer] in 2013 adopted a revised version of the federations office-based opioid treatment policies. The Model Policy on DATA 2000 and Treatment of Opioid Addiction in the Medical Office - 2013 (PDF  279 KB) provides model guidelines for use by state medical boards in regulating office-based opioid treatment.  Holiday Guidance for Opioid Treatment Programs (PDF  203 KB) In response to requests for the upcoming federal holidays and ensuing weekends (December 24th, 25th, and 26th and December 31st, Jan 1st, and Jan 2nd), this letter is to provide guidance  regarding requests for unsupervised doses of medication for patients for these dates. View a sample [id number] (PDF  194 KB).  Federal regulation of drugs emerged as early as 59, under a law that addressed only imported drugs. In 1905 the [employer] launched a private, voluntary means of controlling a substantial part of the drug marketplace, a system that remained in place for over a half-century. Drug regulation in FDA has evolved considerably since President Ricardo Para signed the 1906 Pure Food and Drugs Act.  1820 Eleven doctors set up the U.S. Pharmacopeia and record the first list of standard drugs. 1848 Drug Importation Act passed by Congress requires U.S. Customs Service inspection to stop entry of tainted, low quality drugs from overseas. 8116 Dr. Mitchell MICAEL Burrs becomes the chief chemist at the [facility] of [employer] adulteration studies.  1905 The American Medical Association [facility]) begins a voluntary program of drug approval that would last until 1955. In order to advertise in the [facility] and related journals, drug companies must show proof that the drug will treat what they claim. 1906 The original Food and Drug Act is passed by Congress on June 30 and signed by Angus Ricardo  Roosevelt. The Act outlaws states from buying and selling food, drinks, and drugs that have been mislabeled and tainted. 1911 In U.S. v. Vicci, the [employer] that the [employer] and Drugs Act does not outlaw false medical claims but only false and misleading statements about the ingredients or identity of a drug. 1912 Congress passes the [place] Amendment to overcome the ruling in U.S. v. Vicci. The Act outlaws labeling medicines with fake medical claims that is meant to trick the buyer. 1930 The name of the Food, Drug, and Insecticide Administration is shortened to Food and Drug Administration (FDA) under an therapist, music. 1933 FDA  recommends a total rewrite of the out-of-date 1906 Food and Drugs Act.   1937 Elixir Sulfanilamide, contain the poisonous liquid, diethylene glycol, kills 107 persons, many of whom are children, dramatizing the need to establish drug safety before marketing and to pass the pending food and drug law. 1938 Congress passes [employer], Drug, and Cosmetic (FDC) Act of 1938, which requires that new drugs show safety before selling. This starts a new system of drug regulation. The Act also requires that safe limits be set for unavoidable poisonous matter and allows for factory inspections. The Directv is given power to oversee advertising for all FDAregulated products except prescription drugs. FDA states that sulfanilamide and other dangerous drugs must be given under the direction of a medical expert. This begins the requirement for prescription only (nonnarcotic) drugs (see 1951 [place]phrey amendment). 1941 Nearly 300 deaths and injuries result from the use of sulfathiazole tablets, an antibiotic, tainted with the sedative, phenobarbital. In response, FDA drastically changes manufacturing and quality controls. These changes lead to the development of good manufacturing practices (GMPs). 1948 The [employer] in U.S. v. Floretta that FDA jurisdiction extends to retail stores, thereby allowing FDA to stop illegal sales of drugs by pharmacies including barbiturates and amphetamines. 1950 In Walgreen. v. U.S., a U.S. Court of Appeals rules that the directions for use on a drug label must include the drugs purpose. 1951 Congress passes the [place]-Humphrey Amendment, which defines the kinds of drugs that cannot be used safely without medical supervision. The amendment limits sale of these drugs to prescription only by a medical professional. All other drugs are to be available without a prescription. 1952 A nationwide investigation by FDA  reveals that chloramphenicol, an antibiotic, caused nearly 180 cases of often deadly blood diseases. Two years later FDA engages the [employer] of Hospital Pharmacists, the American Association of Medical Record Librarians, and later the American Medical Association in a voluntary program of drug reaction reporting. 1953 The [employer] Amendment clarifies previous law and requires FDA to give manufacturers written reports of conditions seen during inspections and results of factory samples. 1962 Thalidomide, a new sleeping pill, causes severe birth defects of the arms and legs in thousands of babies born in [place]. The U.S. media reports on how Dr. Cathlean Mort, a FDA medical officer, helped prevent approval and marketing of Thalidomide in the [place] . These reports stirred up public support for stronger drug laws. 3 Congress passes the [employer]. For the first time, these laws require drug makers to prove their drug works before FDA can approve them for sale. The Advisory Committee on Investigational Drugs meets for the first time. This was the first meeting of a committee to advise FDA on product approval and policy on an ongoing basis. 1966 FDA contracts with the [employer]  [employer]y [employer] Sciences/National Research Council to measure the effectiveness of 4,000 marketed drugs approved on the basis of safety alone between [phone] and 03/23/1960. The Fair Packaging and Labeling Act requires all consumer products, in interstate commerce, to be honestly and informatively labeled. 03-23-1966 FDA forms the Drug Efficacy Study Implementation (DESI) to carry out recommendations of the [employer] of the effectiveness of drugs first sold between [place] and 15-Feb-19621970/02/15 FDA requires the first patient package insert, medicines must come with information for the patient about risks and benefits. 1972 Over-the-Counter Drug Review begins  to enhance the safety, effectiveness and appropriate labeling of drugs sold without prescription. 1973 The U.S. Supreme Court upholds the [place] drug effectiveness law and approves FDAs action to control entire classes of products. 1982 FDA issues Tamper-resistant Packaging Regulations to prevent poisonings such as deaths from cyanide placed in Tylenol  capsules. Congress passes the [employer] in 03-23-1981, making it a crime to tamper with packaged consumer products. 03/23/1982 Drug Price Advertising Account Planner Act (Hatch-Waxman Act) increases the availability of less costly generic drugs by allowing FDA to approve applications for generic versions of brand-name drugs without repeating the research that proved the safety and effectiveness of the brand-name drugs. The Act also allowed brand-name companies to apply for up to five years additional patent protection for the new medicines they developed to make up for time lost while their products were going through FDA's approval process. 1989 The FDA issued guidelines asking drug makers to decide if a drug is likely to have usefulness in elderly people and to include elderly people in studies when applicable. 1991 In March 24, 1979, the FDA and the Department of Health and Human Services published a policy on protecting people in research. In 1989-03-23, this policy is adopted by more than a dozen federal agencies involved in human subject research and becomes known as the Common Rule. 4 1993 FDA launches MedWatch, a system designed to collect reports from health professionals on problems with drugs and other medical products. FDA issues guidelines for measuring gender differences in responses to medication. Drug companies are encouraged to include patients of both sexes in their research of drugs and to study any gender-specific effects. 1995 FDA declares cigarettes to be drug delivery devices. Limits are issued on marketing and  sales to reduce smoking by young people. 1998 FDA introduces the Adverse Event Reporting System (AERS), a computerized database designed to store and study safety reports on already marketed drugs.  The Demographic Rule requires that a marketing application review data on safety and effectiveness by age, gender, and race. The Pediatric Rule requires drug makers of selected new and existing drugs to conduct studies on drug safety and effectiveness in children. 1999 Creation of the Drug Facts Label for OTC drug products. The law requires all overthe-counter drug labels to have information in a standard format. These drug facts labels are designed to give the user easy-to-find information. 2000 The U. S. [employer], upholds an earlier decision from [employer] and Drug Administration v. [employer]. et al. and rules 5-4 that FDA does not have authority to regulate tobacco as a drug. 23-Mar-2000 The [employer] Pharmaceuticals for Children Act, in exchange for studying the drug in children, the drug maker gets six months of selling their product without competition. 23-Mar-2001 The Pediatric Research Equity Act gives FDA the right to ask drug companies to study the effectiveness of new drugs in children. Mar 23, 2002 FDA advises medical professionals  to limit the use of a pain reliever called Cox-2, a nonsteroidal anti-inflammatory drug (NSAIDs). Studies had shown that long-term use raised chances of heart attacks and strokes. The warning is also added to the over-thecounter NSAIDs Drug Facts label. Medicines used in hospitals must have a bar code to prevent patients from receiving the wrong medicine. 5 2005 The Drug Safety Board is formed, consisting of FDA staff and representatives from the [employer] of [address] and the [employer]. The Board advises the Director, Center for Drug Evaluation and Research, FDA, on drug safety issues and works with the agency in sharing safety  information to health professionals and patients.  The [place]  Food and Drug Administration (FDA) was first created to enforce the Pure Food and Drug Act of 1906. In this capacity, the FDA is charged with protecting the health of the [place]  public, to ensure the quality of its food, medicine, and cosmetics. Before this time, the [place]  government had no formal oversight of these products and left issues of quality and purity to the individual manufactures, or at times, individual states.    Review: [place] Stop ACT. (The Strengthen Opioid Misuse Prevention (STOP) Act of 2017). GENERAL ASSEMBLY OF [place]  SESSION 2017 SESSION LAW 2017-74 HOUSE BILL 243  PMP mandatory The dispenser shall report: (1) The dispenser's DEA number. (2) The name of the patient for whom the controlled substance is being dispensed, and the patient's: a. Full address, including city, state, and zip code, b. Telephone number, and c. Date of birth. (3) The date the prescription was written. (4) The date the prescription was filled. (5) The prescription number. (6) Whether the prescription is new or a refill. (7) Metric quantity of the dispensed drug. (8) Estimated days of supply of dispensed drug, if provided to the dispenser. (9) National Drug Code of dispensed drug. (10) Prescriber's DEA number. (11) Method of payment for the prescription.  No paper prescriptions  Duration of scripts Acute vs Chronic prescribing  2016 CDC Guidelines for prescribing Opioids for Chronic Pain. (Updated in 2022.) Medical Board  Laws:  Prescription Laws Drug laws, rules, and regulations are constantly changing. Any attempt to summarize them would quickly become outdated. Because of that, the Board encourages practitioners who seek guidance on prescribing procedures to refer to the sources listed below in addition to the Boards position statements, rules and Medical Practice Act.  [place]  Board of Pharmacy  (NCBOP) (which offers the states pharmacy laws and rules, and links to the Code of Federal Regulations) [employer] Site: www.ncbop.org  [place]  General Statutes General Web Site: politicalpool.cz See: [place]  Food, Drug, and Cosmetic Act: [id number] & 106-134 See: [place]  Pharmacy Practice Act, Article 4A: [phone] See:   Controlled Substances Act, Article 5: 90-86 & 90-113.8 See: Use of controlled substances to render one mentally incapacitated or physically helpless: [employer] [facility]. Code, Title 21, Food & Drugs www.deadiversion.usdoj.gov Controlled Substances Schedules www.deadiversion.usdoj.gov Drug Warehouse Manager - www.deadiversion.usdoj.gov 42 CFR  8.12 - Federal opioid treatment standards.   Effective October 03, 2015, prior approval will be required for opioid analgesic doses for [facility]. Medicaid and [place] [facility]) beneficiaries which:  Exceed 120 mg of morphine  equivalents (MME) per day  Are greater than a 14-day supply of any opioid, or,  Are non-preferred opioid products on the [place] Medicaid Preferred Drug List (PDL)  FEDERAL 42 CFR  8.12 - Federal opioid treatment standards. Title II of the Comprehensive Drug  Abuse Prevention and Control Act of 1970, commonly known as the Controlled Substance Act (CSA) Title 21 [place]  Code ([place]) Controlled Substances Act.   Reference:   ______________________________________________________________________       ______________________________________________________________________    Medication Rules  Purpose: To inform patients, and their family members, of our medication rules and regulations.  Applies to: All patients receiving prescriptions from our practice (written or electronic).  Pharmacy of record: This is the pharmacy where your electronic prescriptions will be sent. Make sure we have the correct one.  Electronic  prescriptions: In compliance with the [place]  Strengthen Opioid Misuse Prevention (STOP) Act of 2017 (Session Law 2017-74/H243), effective February 06, 2018, all controlled substances must be electronically prescribed. Written prescriptions, faxing, or calling prescriptions to a pharmacy will no longer be done.  Prescription refills: These will be provided only during in-person appointments. No medications will be renewed without a face-to-face evaluation with your provider. Applies to all prescriptions.  NOTE: The following applies primarily to controlled substances (Opioid* Pain Medications).   Type of encounter (visit): For patients receiving controlled substances, face-to-face visits are required. (Not an option and not up to the patient.)  Patient's Responsibilities: Pain Pills: Bring all pain pills to every appointment (except for procedure appointments). Pill counts are required.  Pill Bottles: Bring pills in original pharmacy bottle. Bring bottle, even if empty. Always bring the bottle of the most recent fill.  Medication refills: You are responsible for knowing and keeping track of what medications you are taking and when is it that you will need a refill. The day before your appointment: write a list of all prescriptions that need to be refilled. The day of the appointment: give the list to the admitting nurse. Prescriptions will be written only during appointments. No prescriptions will be written on procedure days. If you forget a medication: it will not be Called in, Faxed, or electronically sent. You will need to get another appointment to get these prescribed. No early refills. Do not call asking to have your prescription filled early. Partial  or short prescriptions: Occasionally your pharmacy may not have enough pills to fill your prescription.  NEVER ACCEPT a partial fill or a prescription that is short of the total amount of pills that you were prescribed.  With  controlled substances the law allows 72 hours for the pharmacy to complete the prescription.  If the prescription is not completed within 72 hours, the pharmacist will require a new prescription to be written. This means that you will be short on your medicine and we WILL NOT send another prescription to complete your original prescription.  Instead, request the pharmacy to send a carrier to a nearby branch to get enough medication to provide you with your full prescription. Prescription Accuracy: You are responsible for carefully inspecting your prescriptions before leaving our office. Have the discharge nurse carefully go over each prescription with you, before taking them home. Make sure that your name is accurately spelled, that your address is correct. Check the name and dose of your medication to make sure it is accurate. Check the number of pills, and the written instructions to make sure they are clear and accurate. Make sure that you are given enough medication to last until your next medication refill appointment. Taking Medication: Take medication as prescribed. When it comes to controlled substances, taking less pills or less frequently than prescribed is permitted and encouraged. Never take more pills than instructed. Never take the medication more frequently  than prescribed.  Inform other Doctors: Always inform, all of your healthcare providers, of all the medications you take. Pain Medication from other Providers: You are not allowed to accept any additional pain medication from any other Doctor or Healthcare provider. There are two exceptions to this rule. (see below) In the event that you require additional pain medication, you are responsible for notifying us , as stated below. Cough Medicine: Often these contain an opioid, such as codeine or hydrocodone. Never accept or take cough medicine containing these opioids if you are already taking an opioid* medication. The combination may cause  respiratory failure and death. Medication Agreement: You are responsible for carefully reading and following our Medication Agreement. This must be signed before receiving any prescriptions from our practice. Safely store a copy of your signed Agreement. Violations to the Agreement will result in no further prescriptions. (Additional copies of our Medication Agreement are available upon request.) Laws, Rules, & Regulations: All patients are expected to follow all [address] and [employer], Rules, [place] [employer]. Ignorance of the Laws does not constitute a valid excuse.  Illegal drugs and Controlled Substances: The use of illegal substances (including, but not limited to marijuana and its derivatives) and/or the illegal use of any controlled substances is strictly prohibited. Violation of this rule may result in the immediate and permanent discontinuation of any and all prescriptions being written by our practice. The use of any illegal substances is prohibited. Adopted CDC guidelines & recommendations: Target dosing levels will be at or below 60 MME/day. Use of benzodiazepines** is not recommended. Urine Drug testing: Patients taking controlled substances will be required to provide a urine sample upon request. Do not void before coming to your medication management appointments. Hold emptying your bladder until you are admitted. The admitting nurse will inform you if a sample is required. Our practice reserves the right to call you at any time to provide a sample. Once receiving the call, you have 24 hours to comply with request. Not providing a sample upon request may result in termination of medication therapy.  Exceptions: There are only two exceptions to the rule of not receiving pain medications from other Healthcare Providers. Exception #1 (Emergencies): In the event of an emergency (i.e.: accident requiring emergency care), you are allowed to receive additional pain medication. However, you are  responsible for: As soon as you are able, call our office [phone], at any time of the day or night, and leave a message stating your name, the date and nature of the emergency, and the name and dose of the medication prescribed. In the event that your call is answered by a member of our staff, make sure to document and save the date, time, and the name of the person that took your information.  Exception #2 (Planned Surgery): In the event that you are scheduled by another doctor or dentist to have any type of surgery or procedure, you are allowed (for a period no longer than 30 days), to receive additional pain medication, for the acute post-op pain. However, in this case, you are responsible for picking up a copy of our Post-op Pain Management for Surgeons handout, and giving it to your surgeon or dentist. This document is available at our office, and does not require an appointment to obtain it. Simply go to our office during business hours (Monday-Thursday from 8:00 AM to 4:00 PM) (Friday 8:00 AM to 12:00 Noon) or if you have a scheduled appointment with us , prior to your  surgery, and ask for it by name. In addition, you are responsible for: calling our office [phone], at any time of the day or night, and leaving a message stating your name, name of your surgeon, type of surgery, and date of procedure or surgery. Failure to comply with your responsibilities may result in termination of therapy involving the controlled substances.  Consequences:  Non-compliance with the above rules may result in permanent discontinuation of medication prescription therapy. All patients receiving any type of controlled substance is expected to comply with the above patient responsibilities. Not doing so may result in permanent discontinuation of medication prescription therapy. Medication Agreement Violation. Following the above rules, including your responsibilities will help you in avoiding a Medication  Agreement Violation (Breaking your Pain Medication Contract).  *Opioid medications include: morphine , codeine, oxycodone , oxymorphone, hydrocodone, hydromorphone, meperidine, tramadol , tapentadol, buprenorphine, fentanyl , methadone . **Benzodiazepine medications include: diazepam  (Valium ), alprazolam (Xanax), clonazepam (Klonopine), lorazepam (Ativan), clorazepate (Tranxene), chlordiazepoxide (Librium), estazolam (Prosom), oxazepam (Serax), temazepam (Restoril), triazolam (Halcion) (Last updated: 11/29/2022) ______________________________________________________________________     ______________________________________________________________________    Medication Recommendations and Reminders  Applies to: All patients receiving prescriptions (written and/or electronic).  Medication Rules & Regulations: You are responsible for reading, knowing, and following our Medication Rules document. These exist for your safety and that of others. They are not flexible and neither are we. Dismissing or ignoring them is an act of non-compliance that may result in complete and irreversible termination of such medication therapy. For safety reasons, non-compliance will not be tolerated. As with the U[place]S. fundamental legal principle of ignorance of the law is no defense, we will accept no excuses for not having read and knowing the content of documents provided to you by our practice.  Pharmacy of record:  Definition: This is the pharmacy where your electronic prescriptions will be sent.  We do not endorse any particular pharmacy. It is up to you and your insurance to decide what pharmacy to use.  We do not restrict you in your choice of pharmacy. However, once we write for your prescriptions, we will NOT be re-sending more prescriptions to fix restricted supply problems created by your pharmacy, or your insurance.  The pharmacy listed in the electronic medical record should be the one where you want  electronic prescriptions to be sent. If you choose to change pharmacy, simply notify our nursing staff. Changes will be made only during your regular appointments and not over the phone.  Recommendations: Keep all of your pain medications in a safe place, under lock and key, even if you live alone. We will NOT replace lost, stolen, or damaged medication. We do not accept Police Reports as proof of medications having been stolen. After you fill your prescription, take 1 week's worth of pills and put them away in a safe place. You should keep a separate, properly labeled bottle for this purpose. The remainder should be kept in the original bottle. Use this as your primary supply, until it runs out. Once it's gone, then you know that you have 1 week's worth of medicine, and it is time to come in for a prescription refill. If you do this correctly, it is unlikely that you will ever run out of medicine. To make sure that the above recommendation works, it is very important that you make sure your medication refill appointments are scheduled at least 1 week before you run out of medicine. To do this in an effective manner, make sure that you do not leave the  office without scheduling your next medication management appointment. Always ask the nursing staff to show you in your prescription , when your medication will be running out. Then arrange for the receptionist to get you a return appointment, at least 7 days before you run out of medicine. Do not wait until you have 1 or 2 pills left, to come in. This is very poor planning and does not take into consideration that we may need to cancel appointments due to bad weather, sickness, or emergencies affecting our staff. DO NOT ACCEPT A Partial Fill: If for any reason your pharmacy does not have enough pills/tablets to completely fill or refill your prescription, do not allow for a partial fill. The law allows the pharmacy to complete that prescription within 72  hours, without requiring a new prescription. If they do not fill the rest of your prescription within those 72 hours, you will need a separate prescription to fill the remaining amount, which we will NOT provide. If the reason for the partial fill is your insurance, you will need to talk to the pharmacist about payment alternatives for the remaining tablets, but again, DO NOT ACCEPT A PARTIAL FILL, unless you can trust your pharmacist to obtain the remainder of the pills within 72 hours.  Prescription refills and/or changes in medication(s):  Prescription refills, and/or changes in dose or medication, will be conducted only during scheduled medication management appointments. (Applies to both, written and electronic prescriptions.) No refills on procedure days. No medication will be changed or started on procedure days. No changes, adjustments, and/or refills will be conducted on a procedure day. Doing so will interfere with the diagnostic portion of the procedure. No phone refills. No medications will be called into the pharmacy. No Fax refills. No weekend refills. No Holliday refills. No after hours refills.  Remember:  Business hours are:  Monday to Thursday 8:00 AM to 4:00 PM Provider's Schedule: Eric Como, MD - Appointments are:  Medication management: Monday and Wednesday 8:00 AM to 4:00 PM Procedure day: Tuesday and Thursday 7:30 AM to 4:00 PM Wallie Sherry, MD - Appointments are:  Medication management: Tuesday and Thursday 8:00 AM to 4:00 PM Procedure day: Monday and Wednesday 7:30 AM to 4:00 PM (Last update: 11/29/2021) ______________________________________________________________________     ______________________________________________________________________    National Pain Medication Shortage  The [place] is experiencing worsening drug shortages. These have had a negative widespread effect on patient care and treatment. Not expected to improve any time soon.  Predicted to last past 2029.   Drug shortage list (generic names) Oxycodone  IR Oxycodone /APAP Oxymorphone IR Hydromorphone Hydrocodone/APAP Morphine   Where is the problem?  Manufacturing and supply level.  Will this shortage affect you?  Only if you take any of the above pain medications.  How? You may be unable to fill your prescription.  Your pharmacist may offer a partial fill of your prescription. (Warning: Do not accept partial fills.) Prescriptions partially filled cannot be transferred to another pharmacy. Read our Medication Rules and Regulation. Depending on how much medicine you are dependent on, you may experience withdrawals when unable to get the medication.  Recommendations: Consider ending your dependence on opioid pain medications. Ask your pain specialist to assist you with the process. Consider switching to a medication currently not in shortage, such as Buprenorphine. Talk to your pain specialist about this option. Consider decreasing your pain medication requirements by managing tolerance thru Drug Holidays. This may help minimize withdrawals, should you run out of medicine. Control your  pain thru the use of non-pharmacological interventional therapies.   Your prescriber: Prescribers cannot be blamed for shortages. Medication manufacturing and supply issues cannot be fixed by the prescriber.   NOTE: The prescriber is not responsible for supplying the medication, or solving supply issues. Work with your pharmacist to solve it. The patient is responsible for the decision to take or continue taking the medication and for identifying and securing a legal supply source. By law, supplying the medication is the job and responsibility of the pharmacy. The prescriber is responsible for the evaluation, monitoring, and prescribing of these medications.   Prescribers will NOT: Re-issue prescriptions that have been partially filled. Re-issue prescriptions already sent to  a pharmacy.  Re-send prescriptions to a different pharmacy because yours did not have your medication. Ask pharmacist to order more medicine or transfer the prescription to another pharmacy. (Read below.)  New 2023 regulation: October 07, 2021 Revised Regulation Allows DEA-Registered Pharmacies to Transfer Electronic Prescriptions at a Patients Request DEA Headquarters Division - Public Information Office Patients now have the ability to request their electronic prescription be transferred to another pharmacy without having to go back to their practitioner to initiate the request. This revised regulation went into effect on Monday, October 03, 2021.     At a patients request, a DEA-registered retail pharmacy can now transfer an electronic prescription for a controlled substance (schedules II-V) to another DEA-registered retail pharmacy. Prior to this change, patients would have to go through their practitioner to cancel their prescription and have it re-issued to a different pharmacy. The process was taxing and time consuming for both patients and practitioners.    The Drug Enforcement Administration [facility]) published its intent to revise the process for transferring electronic prescriptions on December 26, 2019.  The final rule was published in the federal register on September 01, 2021 and went into effect 30 days later.  Under the final rule, a prescription can only be transferred once between pharmacies, and only if allowed under existing state or other applicable law. The prescription must remain in its electronic form; may not be altered in any way; and the transfer must be communicated directly between two licensed pharmacists. Its important to note, any authorized refills transfer with the original prescription, which means the entire prescription will be filled at the same pharmacy.   Reference: hugehand.is [facility] website announcement)  Cheapwipes.at.pdf [employer])   [employer] / Vol. 88, No. 143 / Thursday, September 01, 2021 / Rules and Regulations DEPARTMENT OF JUSTICE  Drug Enforcement Administration  21 CFR Part 1306  [Docket No. [id number]]  RIN [id number] Transfer of Electronic Prescriptions for Schedules II-V Controlled Substances Between Pharmacies for Initial Filling  ______________________________________________________________________       ______________________________________________________________________    Transfer of Pain Medication between Pharmacies  Re: 2023 DEA Clarification on existing regulation  Published on DEA Website: October 07, 2021  Title: Revised Regulation Allows DEA-Registered Pharmacies to Electrical Engineer Prescriptions at a Patients Request DEA Headquarters Division - [employer]  Patients now have the ability to request their electronic prescription be transferred to another pharmacy without having to go back to their practitioner to initiate the request. This revised regulation went into effect on Monday, October 03, 2021.     At a patients request, a DEA-registered retail pharmacy can now transfer an electronic prescription for a controlled substance (schedules II-V) to another DEA-registered retail pharmacy. Prior to this change, patients would have to go through their  practitioner to cancel their prescription and have it re-issued to a different pharmacy. The process was taxing and time consuming for both patients and practitioners.    The Drug Enforcement Administration [facility]) published its intent to revise the process for transferring electronic prescriptions on December 26, 2019.  The final rule was published in the  federal register on September 01, 2021 and went into effect 30 days later.  Under the final rule, a prescription can only be transferred once between pharmacies, and only if allowed under existing state or other applicable law. The prescription must remain in its electronic form; may not be altered in any way; and the transfer must be communicated directly between two licensed pharmacists. Its important to note, any authorized refills transfer with the original prescription, which means the entire prescription will be filled at the same pharmacy.    REFERENCES: 1. DEA website announcement hugehand[employer]is  2. Department of Justice website  Cheapwipes.at.pdf  3. DEPARTMENT OF JUSTICE Drug Enforcement Administration 21 CFR Part 1306 [Docket No. [id number]] RIN [id number]-AB64 Transfer of Electronic Prescriptions for Schedules II-V Controlled Substances Between Pharmacies for Initial Filling  ______________________________________________________________________       ______________________________________________________________________    Medication Transfer   Notification You are currently compliant and stable on your pain medication regimen. This regimen will be transferred today to your Primary Care Provider (PCP). You will be provided with enough prescriptions to last for 90 days. After that, your prescriptions will need to be taken over by your PCP.  Recommendation Immediately contact your primary care provider to secure an appointment for evaluation before this period is over. Do not wait until the last month to contact them.   Clarification The transfer of your medication regimen does not mean that you are being discharged from our clinic. We will remain available to you for any consultation or interventional therapies you may need.    Alternative Should you decide not to continue taking these medication and would like assistance in permanently stopping them, please let us  know so that we can design a slow tapering down of your regimen.  Reason Our primary responsibility to provide specialized interventional pain management therapies otherwise not available to the community. We have in the past assisted primary care providers with reviewing and adjusting pain medication management therapies, however, we have been transparent to all patients and referring providers that it is not our intention to permanently take over this type of therapy. Transfer of this portion of your care will assist us  in freeing time to assist others in need of our specialty services.   ______________________________________________________________________      ______________________________________________________________________    Muscle Spasms & Cramps  Cause(s):  Most common - vitamin and/or electrolyte (calcium , potassium, sodium, etc.) deficiencies. Post procedure - steroids (injected, oral, or inhaled) can make your kidneys excrete (loose) electrolytes. Most of the time this will not cause any symptoms however, if you happen to be borderline low on your electrolytes, it may temporarily triggering cramps & spasms.  Possible triggers: Sweating - causes loss of electrolytes thru the skin. Steroids - causes loss of electrolytes thru the urine.  Treatment: (over-the-counter)  Gatorade (or any other electrolyte-replenishing drink) - Take 1, 8 oz glass with each meal (3 times a day). Mechanism of action: Replenishes lost electrolytes. Magnesium  400 to 500 mg - Take 1 tablet twice a day (one with breakfast and one at bedtime). If you have kidney disease talk to your primary care physician before taking any Magnesium . Mechanism of  action: Magnesium  is a natural muscle relaxant. Tonic Water with quinine - Take 1, 8 oz glass before bedtime.   Mechanism of action: Quinine is used to treat spasms.  Last Update: 08/17/2022  ______________________________________________________________________     ______________________________________________________________________    Appointment Information  It is our goal and responsibility to provide the medical community with assistance in the evaluation and management of patients with chronic pain. Unfortunately our resources are limited. Because we do not have an unlimited amount of time, or available appointments, we are required to closely monitor for unkept or cancelled appointments.  Patient's responsibilities: 1. Punctuality: Patients are required to be physically present in our office at least 15 minutes before their scheduled appointment. 2. Tardiness: Patients not physically present in our office at their scheduled appointment time will be rescheduled. 3. Plan ahead: Assume that you will encounter traffic and plan to arrive 30 minutes before your appointment. 4. Other appointments and responsibilities: Do not schedule other appointments immediately before or after your scheduled appointment.  5. Be prepared: Make a list of everything that you need to discuss with your provider so that you use your time efficiently. Once the provider leaves your room, he/she will not return to your room to discuss anything that you neglected to bring up during your allowed time. 6. No children or pets: Do not bring children or pets to your appointment. 7. Cancelling or rescheduling your appointment: Advanced notification (more than 24 hours in advance) is required. 8. No Show: Not calling to cancel an appointment and simply not showing up is unacceptable. This leads to loss of appointments that could have been used by a patient in need. (See below)  Corrective process for repeat offenders:  No Shows: Three (3) No Shows within a 12 month period will result in an automatic discharge from our  practice. Rescheduling or cancelling with more than 24 hours notice will not be penalized and will not count against you. Tardiness: If you have to be rescheduled three (3) times due to late arrivals, it will be counted as one (1) No Show. Cancellation or reschedule: Three (3) cancellations or rescheduling where notice was given with less than 24 hours in advance, will be recorded as one (1) No Show.  Types of appointments: New patient initial evaluation: These are evaluations only. Your initial patient questionnaire will be collected and entered into the system. A history of present illness will be taken. Prior lab work, imaging studies, and associated treatments will be reviewed. The provider may order appropriate diagnostic testing depending on their evaluation and review of available information. No treatments will be started on this visit. 2nd Follow-up visit: During this visit your provider will inform you of the results of the diagnostic tests ordered on the initial evaluation. Based on the providers assessment, treatment options will be offered, at which the patient will decide if he/she is interested in the alternatives. If interested, a treatment plan will be established and started. Procedure visits: Post-procedure evaluation visits: Evaluation visits MM New problems Flare-up evaluations Follow-up after diagnostic testing ______________________________________________________________________     "

## 2024-02-21 NOTE — Telephone Encounter (Signed)
 Incoming call from insurance rep of [employer], who is calling to initiate prior auth on Myrbetriq  50mg . Auth information provided. PA approved.

## 2024-02-22 ENCOUNTER — Other Ambulatory Visit: Payer: Self-pay | Admitting: Internal Medicine

## 2024-02-22 DIAGNOSIS — I10 Essential (primary) hypertension: Secondary | ICD-10-CM

## 2024-02-25 ENCOUNTER — Ambulatory Visit: Admitting: Psychiatry

## 2024-02-25 ENCOUNTER — Telehealth: Payer: Self-pay

## 2024-02-25 ENCOUNTER — Other Ambulatory Visit: Payer: Self-pay

## 2024-02-25 ENCOUNTER — Encounter: Payer: Self-pay | Admitting: Psychiatry

## 2024-02-25 ENCOUNTER — Other Ambulatory Visit: Payer: Self-pay | Admitting: Urology

## 2024-02-25 VITALS — BP 148/68 | HR 94 | Temp 97.6°F | Ht 72.0 in | Wt 198.0 lb

## 2024-02-25 DIAGNOSIS — R419 Unspecified symptoms and signs involving cognitive functions and awareness: Secondary | ICD-10-CM

## 2024-02-25 DIAGNOSIS — F29 Unspecified psychosis not due to a substance or known physiological condition: Secondary | ICD-10-CM

## 2024-02-25 DIAGNOSIS — E291 Testicular hypofunction: Secondary | ICD-10-CM

## 2024-02-25 DIAGNOSIS — F3341 Major depressive disorder, recurrent, in partial remission: Secondary | ICD-10-CM

## 2024-02-25 DIAGNOSIS — F411 Generalized anxiety disorder: Secondary | ICD-10-CM | POA: Diagnosis not present

## 2024-02-25 NOTE — Telephone Encounter (Signed)
 Patients wife left a voicemail stating that the patient was seen today and she wanted to make sure Dr.Eappen is aware that the patient is anemic and asked whether the new medication prescribed today could affect his anemia.

## 2024-02-25 NOTE — Patient Instructions (Addendum)
Please call for EKG - [phone]   Aripiprazole Tablets What is this medication? ARIPIPRAZOLE (ay ri PIP ray zole) treats schizophrenia, bipolar I disorder, autism spectrum disorder, and Tourette disorder. It may also be used with antidepressant medications to treat depression. It works by balancing the levels of dopamine and serotonin in the brain, hormones that help regulate mood, behaviors, and thoughts. It belongs to a group of medications called antipsychotics. Antipsychotics can be used to treat several kinds of mental health conditions. This medicine may be used for other purposes; ask your health care provider or pharmacist if you have questions. COMMON BRAND NAME(S): Abilify What should I tell my care team before I take this medication? They need to know if you have any of these conditions: Dementia Diabetes Difficulty swallowing Have trouble controlling your muscles Heart disease History of irregular heartbeat History of stroke Low blood cell levels (white cells, red cells, and platelets) Low blood pressure Parkinson disease Seizures Suicidal thoughts, plans, or attempt by you or a family member Urges to engage in impulsive behaviors in ways that are unusual for you An unusual or allergic reaction to aripiprazole, other medications, foods, dyes, or preservatives Pregnant or trying to get pregnant Breastfeeding How should I use this medication? Take this medication by mouth with a glass of water. Take it as directed on the prescription label at the same time every day. You can take it with or without food. If it upsets your stomach, take it with food. Do not take your medication more often than directed. Keep taking it unless your care team tells you to stop. A special MedGuide will be given to you by the pharmacist with each prescription and refill. Be sure to read this information carefully each time. Talk to your care team about the use of this medication in children.  While it may be prescribed for children as young as 6 years for selected conditions, precautions do apply. Overdosage: If you think you have taken too much of this medicine contact a poison control center or emergency room at once. NOTE: This medicine is only for you. Do not share this medicine with others. What if I miss a dose? If you miss a dose, take it as soon as you can. If it is almost time for your next dose, take only that dose. Do not take double or extra doses. What may interact with this medication? Do not take this medication with any of the following: Brexpiprazole Cisapride Dextromethorphan; quinidine Dronedarone Metoclopramide Pimozide Quinidine Thioridazine This medication may also interact with the following: Antihistamines for allergy, cough, and cold Carbamazepine Certain medications for anxiety or sleep Certain medications for depression, such as amitriptyline, fluoxetine, paroxetine, or sertraline Certain medications for fungal infections, such as fluconazole, itraconazole, ketoconazole, posaconazole, or voriconazole Clarithromycin General anesthetics, such as halothane, isoflurane, methoxyflurane, or propofol Medications for Parkinson disease, such as levodopa Medications for blood pressure Medications for seizures Medications that relax muscles for surgery Opioid medications for pain Other medications that cause heart rhythm changes Phenothiazines, such as chlorpromazine or prochlorperazine Rifampin This list may not describe all possible interactions. Give your health care provider a list of all the medicines, herbs, non-prescription drugs, or dietary supplements you use. Also tell them if you smoke, drink alcohol, or use illegal drugs. Some items may interact with your medicine. What should I watch for while using this medication? Visit your care team for regular checks on your progress. Tell your care team if your symptoms do not start  to get better or if  they get worse. Do not suddenly stop taking this medication. You may develop a severe reaction. Your care team will tell you how much medication to take. If your care team wants you to stop the medication, the dose may be slowly lowered over time to avoid any side effects. Patients and their families should watch out for new or worsening depression or thoughts of suicide. Also watch out for sudden changes in feelings such as feeling anxious, agitated, panicky, irritable, hostile, aggressive, impulsive, severely restless, overly excited and hyperactive, or not being able to sleep. If this happens, especially at the beginning of antidepressant treatment or after a change in dose, call your care team. This medication may affect your coordination, reaction time, or judgment. Do not drive or operate machinery until you know how this medication affects you. Sit up or stand slowly to reduce the risk of dizzy or fainting spells. Drinking alcohol with this medication can increase the risk of these side effects. This medication can cause problems with controlling your body temperature. It can lower the response of your body to cold temperatures. If possible, stay indoors during cold weather. If you must go outdoors, wear warm clothes. It can also lower the response of your body to heat. Do not overheat. Do not over-exercise. Stay out of the sun when possible. If you must be in the sun, wear cool clothing. Drink plenty of water. If you have trouble controlling your body temperature, call your care team right away. This medication may cause dry eyes and blurred vision. If you wear contact lenses, you may feel some discomfort. Lubricating eye drops may help. See your care team if the problem does not go away or is severe. This medication may increase blood sugar. Ask your care team if changes in diet or medications are needed if you have diabetes. There have been reports of increased sexual urges or other strong urges such  as gambling while taking this medication. If you experience any of these while taking this medication, you should report this to your care team as soon as possible. What side effects may I notice from receiving this medication? Side effects that you should report to your care team as soon as possible: Allergic reactions--skin rash, itching, hives, swelling of the face, lips, tongue, or throat High blood sugar (hyperglycemia)--increased thirst or amount of urine, unusual weakness or fatigue, blurry vision High fever, stiff muscles, increased sweating, fast or irregular heartbeat, and confusion, which may be signs of neuroleptic malignant syndrome Low blood pressure--dizziness, feeling faint or lightheaded, blurry vision Pain or trouble swallowing Prolonged or painful erection Seizures Stroke--sudden numbness or weakness of the face, arm, or leg, trouble speaking, confusion, trouble walking, loss of balance or coordination, dizziness, severe headache, change in vision Uncontrolled and repetitive body movements, muscle stiffness or spasms, tremors or shaking, loss of balance or coordination, restlessness, shuffling walk, which may be signs of extrapyramidal symptoms (EPS) Thoughts of suicide or self-harm, worsening mood, feelings of depression Urges to engage in impulsive behaviors such as gambling, binge eating, sexual activity, or shopping in ways that are unusual for you Side effects that usually do not require medical attention (report these to your care team if they continue or are bothersome): Constipation Drowsiness Weight gain This list may not describe all possible side effects. Call your doctor for medical advice about side effects. You may report side effects to FDA at 1-800-FDA-1088. Where should I keep my medication? Keep out of  the reach of children and pets. Store at room temperature between 15 and 30 degrees C (59 and 86 degrees F). Throw away any unused medication after the  expiration date. NOTE: This sheet is a summary. It may not cover all possible information. If you have questions about this medicine, talk to your doctor, pharmacist, or health care provider.  2024 Elsevier/Gold Standard (2021-08-13 00:00:00)

## 2024-02-25 NOTE — Progress Notes (Unsigned)
 [facility] MD OP Progress Note  02/25/2024 3:25 PM Chase Mendez  MRN:  [id number]  Chief Complaint:  Chief Complaint  Patient presents with   Follow-up   Anxiety   Depression   Medication Refill   Discussed the use of AI scribe software for clinical note transcription with the patient, who gave verbal consent to proceed.  History of Present Illness Chase Mendez) is an 89 year old Caucasian male, veteran, retired from [employer], lives in [place] with his wife, has a history of MDD, GAD, chronic pain, bereavement, foot drop left-sided, hypothyroidism, hypertension, history of frequent falls, hearing loss, macular degeneration with vision loss was evaluated in office today for a follow-up appointment.  Ongoing visual hallucinations continue, with him describing seeing people who are not present. He states that these hallucinations do not bother him and that he can ignore them or distract himself, noting that the hallucinations disappear when he turns his head. His family members confirm nothing is present when he points out the hallucinations. He notes that the hallucinations have persisted after he stopped sertraline  (Zoloft ), which he discontinued following discussion at his last visit.  It is likely hallucinations are secondary to the macular degeneration.  He continues to struggle with vision loss due to the same.  He endorses feeling depressed and expresses a desire for help with his depression.  He is unable to elaborate further.  He is not interested in going back on the sertraline .  Discussed aripiprazole or Abilify and he is willing to try it.  He denies suicidal thoughts and denies thoughts about hurting others.  He appeared to be alert, oriented to person place time situation.  3 word memory immediate 3 out of 3, after 5 minutes 3 out of 3.  Attention and focus seem to be limited.   Visit Diagnosis:    ICD-10-CM   1. Recurrent major depressive disorder, in partial remission   F33.41    Mild    2. GAD (generalized anxiety disorder)  F41.1     3. Neurocognitive disorder  R41.9    likely mild    4. Psychosis, unspecified psychosis type (HCC)  F29       Past Psychiatric History: I have reviewed past psychiatric history from progress note on 09/07/2022.  Past trials of medications like Vraylar ( cost), mirtazapine , Celexa , risperidone , Cymbalta , Wellbutrin , BuSpar , trazodone , sertraline .  Past Medical History:  Past Medical History:  Diagnosis Date   Acute encephalopathy 12/08/2014   Anxiety    ARF (acute renal failure) 12/08/2014   Back pain    Benign neoplasm of large bowel    Capsulitis    fractured displaced metatarsal with capsulitis   Chronic back pain    Coronavirus infection    Depression    Diabetes mellitus without complication (HCC)    no medications currently   Dysphagia    Exostosis    painful, right hallux   Foot drop, left    wears a brace   Frequent falls 02/2018   poor balance, foot drop   GERD (gastroesophageal reflux disease)    Gout    Hypertension    Hypothyroidism    Insomnia    Low testosterone     Microscopic hematuria 2016   Myocardial infarction [facility])    patient unaware when it happened years ago.     Pneumonia 12/07/2014   Pressure ulcer 12/09/2014   Sepsis (HCC) 12/08/2014   Thyroid  disease     Past Surgical History:  Procedure Laterality Date  APPENDECTOMY     BACK SURGERY  1995, 1996   x 2   CHOLECYSTECTOMY     ESOPHAGOGASTRODUODENOSCOPY (EGD) WITH PROPOFOL  N/A 04/24/2017   Procedure: ESOPHAGOGASTRODUODENOSCOPY (EGD) WITH PROPOFOL ;  Surgeon: Viktoria Lamar DASEN, MD;  Location: [facility] ENDOSCOPY;  Service: Endoscopy;  Laterality: N/A;   EYE SURGERY Bilateral 1983, 1985   cataract extractions   SPINAL CORD STIMULATOR INSERTION N/A 03/13/2018   Procedure: SPINAL CORD STIMULATOR INSERTION;  Surgeon: Clois Fret, MD;  Location: [facility] ORS;  Service: Neurosurgery;  Laterality: N/A;    Family Psychiatric History:  Reviewed family psychiatric history from progress note on 09/07/2022.  Family History:  Family History  Problem Relation Age of Onset   Heart disease Mother    Diabetes Father    Kidney cancer Brother     Social History: Reviewed social history from progress note on 09/07/2022. Social History   Socioeconomic History   Marital status: Married    Spouse name: dorothy   Number of children: 2   Years of education: Not on file   Highest education level: Some college, no degree  Occupational History   Occupation: maintenance / repair    Comment: disabled  Tobacco Use   Smoking status: Every Day    Types: Cigars   Smokeless tobacco: Never   Tobacco comments:    unable to give cessation materials due to webex visit   Vaping Use   Vaping status: Never Used  Substance and Sexual Activity   Alcohol use: No   Drug use: Yes    Types: Oxycodone    Sexual activity: Not Currently  Other Topics Concern   Not on file  Social History Narrative   Not on file   Social Drivers of Health   Tobacco Use: High Risk (02/25/2024)   Patient History    Smoking Tobacco Use: Every Day    Smokeless Tobacco Use: Never    Passive Exposure: Not on file  Financial Resource Strain: Low Risk  (01/22/2023)   [facility] from [facility]   Overall Financial Resource Strain (CARDIA)    Difficulty of Paying Living Expenses: Not hard at all  Food Insecurity: No Food Insecurity (01/22/2023)   Received from [facility]   Epic    Within the past 12 months, you worried that your food would run out before you got the money to buy more.: Never true    Within the past 12 months, the food you bought just didn't last and you didn't have money to get more.: Never true  Transportation Needs: No Transportation Needs (07/17/2022)   PRAPARE - Administrator, Civil Service (Medical): No    Lack of Transportation (Non-Medical): No  Physical Activity: Not on file  Stress: Not on  file  Social Connections: Not on file  Depression (PHQ2-9): Medium Risk (02/25/2024)   Depression (PHQ2-9)    PHQ-2 Score: 10  Alcohol Screen: Not on file  Housing: Low Risk  (10/28/2023)   Received from [facility]   Epic    In the last 12 months, was there a time when you were not able to pay the mortgage or rent on time?: No    In the past 12 months, how many times have you moved where you were living?: 0    At any time in the past 12 months, were you homeless or living in a shelter (including now)?: No  Utilities: Not At Risk (01/22/2023)   Received from [facility]   [facility] Utilities    Threatened with loss of utilities: No  Health Literacy: Not on file    Allergies: Allergies[1]  Metabolic Disorder Labs: Lab Results  Component Value Date   HGBA1C 5.6 07/18/2022   MPG 114.02 07/18/2022   No results found for: PROLACTIN Lab Results  Component Value Date   CHOL 86 (L) 09/10/2023   TRIG 93 09/10/2023   HDL 33 (L) 09/10/2023   CHOLHDL 2.6 09/10/2023   VLDL 23 12/08/2014   LDLCALC 35 09/10/2023   LDLCALC 37 06/07/2023   Lab Results  Component Value Date   TSH 0.675 12/24/2023   TSH 0.828 09/10/2023    Therapeutic Level Labs: No results found for: LITHIUM No results found for: VALPROATE No results found for: CBMZ  Current Medications: Current Outpatient Medications  Medication Sig Dispense Refill   acetaminophen  (TYLENOL ) 500 MG tablet Take 500-1,000 mg by mouth 2 (two) times daily as needed for mild pain.     allopurinol  (ZYLOPRIM ) 100 MG tablet TAKE 1 TABLET BY MOUTH EVERY OTHER DAY 45 tablet 1   amLODipine  (NORVASC ) 5 MG tablet Take 2.5 mg by mouth daily. (Patient taking differently: Take 5 mg by mouth as needed.)     aspirin  81 MG tablet Take 81 mg by mouth daily.     atorvastatin  (LIPITOR) 10 MG tablet TAKE 1 TABLET BY MOUTH EVERYDAY AT BEDTIME 90 tablet 1   Cholecalciferol 50 MCG (2000 UT) TABS Take by mouth.      famotidine  (PEPCID ) 20 MG tablet TAKE 1 TABLET BY MOUTH TWICE A DAY 180 tablet 1   fluticasone  (FLONASE ) 50 MCG/ACT nasal spray Place 1-2 sprays into both nostrils daily as needed for allergies or rhinitis.     gabapentin  (NEURONTIN ) 600 MG tablet TAKE 1 TABLET BY MOUTH TWICE DAILY. 180 tablet 1   hydrOXYzine  (VISTARIL ) 25 MG capsule TAKE 1 CAPSULE BY MOUTH ONCE DAILY AS NEEDED FOR ANXIETY 30 capsule 5   isosorbide  mononitrate (IMDUR ) 30 MG 24 hr tablet TAKE 1 TABLET BY MOUTH EVERY DAY 90 tablet 0   levothyroxine  (SYNTHROID ) 150 MCG tablet Take 1 tablet (150 mcg total) by mouth daily. 90 tablet 1   mirabegron  ER (MYRBETRIQ ) 50 MG TB24 tablet TAKE ONE TABLET BY MOUTH ONE TIME DAILY 30 tablet 11   NEEDLE, DISP, 18 G (BD SAFETYGLIDE NEEDLE) 18G X 1-1/2 MISC Use this to Draw up with 50 each 0   ondansetron  (ZOFRAN -ODT) 4 MG disintegrating tablet TAKE 1 TABLET (4 MG TOTAL) BY MOUTH IN THE MORNING, AT NOON, IN THE EVENING, AND AT BEDTIME. 90 tablet 3   SYRINGE-NEEDLE, DISP, 3 ML (B-D 3CC LUER-LOK SYR 21GX1-1/2) 21G X 1-1/2 3 ML MISC Use this needle to injection 50 each 0   vitamin B-12 (CYANOCOBALAMIN ) 1000 MCG tablet Take 1,000 mcg by mouth daily.     Vitamin D, Ergocalciferol, (DRISDOL) 1.25 MG (50000 UNIT) CAPS capsule TAKE 1 CAPSULE BY MOUTH ONE TIME PER WEEK 12 capsule 3   vitamin E  400 UNIT capsule Take 400 Units by mouth daily.     losartan  (COZAAR ) 100 MG tablet TAKE 1 TABLET BY MOUTH EVERY DAY 90 tablet 1   naloxone  (NARCAN ) nasal spray 4 mg/0.1 mL Place 1 spray into the nose as needed (for opioid-induced respiratory depresssion). In case of emergency (overdose), spray once into each nostril. If no response within 3 minutes, repeat application and call 911. 1 each 0   [START ON 03/02/2024] Oxycodone  HCl 10 MG TABS  Take 1 tablet (10 mg total) by mouth every 6 (six) hours as needed. Must last 30 days. 120 tablet 0   [START ON 04/01/2024] Oxycodone  HCl 10 MG TABS Take 1 tablet (10 mg total) by mouth  every 6 (six) hours as needed. Must last 30 days. 120 tablet 0   [START ON 05/01/2024] Oxycodone  HCl 10 MG TABS Take 1 tablet (10 mg total) by mouth every 6 (six) hours as needed. Must last 30 days. 120 tablet 0   testosterone  cypionate (DEPOTESTOSTERONE CYPIONATE) 200 MG/ML injection Inject 0.5 mLs (100 mg total) into the muscle every 14 (fourteen) days. 6 mL 0   No current facility-administered medications for this visit.     Musculoskeletal: Strength & Muscle Tone: within normal limits Gait & Station: [employer] with walker Patient leans: Front  Psychiatric Specialty Exam: Review of Systems  Psychiatric/Behavioral:  Positive for dysphoric mood and hallucinations (Visual, does not bother him). The patient is nervous/anxious.     Blood pressure (!) 148/68, pulse 94, temperature 97.6 F (36.4 C), temperature source Temporal, height 6' (1.829 m), weight 198 lb (89.8 kg).Body mass index is 26.85 kg/m.  General Appearance: Casual  Eye Contact:  Fair  Speech:  Clear and Coherent  Volume:  Normal  Mood:  Anxious and Depressed  Affect:  Appropriate  Thought Process:  Goal Directed and Descriptions of Associations: Intact  Orientation:  Full (Time, Place, and Person)  Thought Content: Hallucinations: Visual does not bother him   Suicidal Thoughts:  No  Homicidal Thoughts:  No  Memory:  Immediate;   Fair Recent;   Fair Remote;   Poor  Judgement:  Fair  Insight:  Fair  Psychomotor Activity:  Normal  Concentration:  Concentration: Poor and Attention Span: Poor  Recall:  [employer] of Knowledge: Fair  Language: Fair  Akathisia:  No  Handed:  Right  AIMS (if indicated): not done  Assets:  Manufacturing Systems Engineer Desire for Improvement Housing Social Support Transportation  ADL's:  Intact  Cognition: WNL  Sleep:  Fair   Screenings: GAD-7    Garment/textile Technologist Visit from 02/25/2024 in [facility] Office Visit from 12/26/2023 in [facility] Office Visit from 11/28/2023 in [facility] Office Visit from 10/23/2023 in [facility] Office Visit from 08/16/2023 in [facility]  Total GAD-7 Score 7 9 8 15 9    PHQ2-9    Flowsheet Row Office Visit from 02/25/2024 in [facility] Office Visit from 02/19/2024 in [facility] Visit from 12/26/2023 in [facility] Office Visit from 11/28/2023 in [facility] Office Visit from 11/26/2023 in [facility] Total Score 5 2 3 4  0  PHQ-9 Total Score 10 -- 7 9 --   Flowsheet Row Office Visit from 02/25/2024 in [facility] Office Visit from 11/28/2023 in [facility] Office Visit from 10/23/2023 in [facility]  C-SSRS RISK CATEGORY No Risk No Risk No Risk     Assessment and Plan: Chase Mendez is a 89 year old Caucasian male who presented for a follow-up appointment, discussed assessment and plan as noted below.  1. Recurrent major depressive disorder, in partial remission Currently reports ongoing depression symptoms.  Also does have visual hallucinations  likely from macular degeneration and reports it does not bother him, coping well.  Not interested in going back on the sertraline .  Sertraline  was discontinued since it was assumed that sertraline  may be contributing to the visual hallucinations.  However no improvement in hallucinations since discontinuation of sertraline .  Discussed trial of Abilify low dosage.  Will need EKG prior to initiation.  He agrees to complete this. Consider starting Abilify low dosage once  EKG reviewed.  2. GAD (generalized anxiety disorder)-improving Currently although anxious reports he has been coping okay. Continue Hydroxyzine  25 mg daily as needed Previously referred to psychotherapist, noncompliant  3. Neurocognitive disorder chronic likely mild Patient to follow-up with primary care.  May need referral to neurology.  4. Psychosis, unspecified psychosis type (HCC)-chronic-unstable Ongoing visual hallucinations likely due to macular degeneration and vision loss. Previous CT head did not show any acute pathology dated 12/29/2019. Will consider repeating CT scan in the future. Will consider low dosage of Abilify.   Follow-up Follow-up in the clinic in 3 to 4 weeks or sooner if needed.    Consent: Patient/Guardian gives verbal consent for treatment and assignment of benefits for services provided during this visit. Patient/Guardian expressed understanding and agreed to proceed.   This note was generated in part or whole with voice recognition software. Voice recognition is usually quite accurate but there are transcription errors that can and very often do occur. I apologize for any typographical errors that were not detected and corrected.    Calinda Stockinger, MD 02/26/2024, 6:02 PM     [1]  Allergies Allergen Reactions   Wellbutrin  [Bupropion ]     hallucinations   Doxycycline Nausea Only   Ibuprofen Nausea Only   Sulfa Antibiotics Nausea Only

## 2024-02-25 NOTE — Telephone Encounter (Signed)
 Yes I agree with that.  Please let them know to complete a CBC with differential. If unable to do so , I can order.

## 2024-02-25 NOTE — Telephone Encounter (Signed)
 We can get a CBC with differential prior to starting the new medication.

## 2024-02-27 ENCOUNTER — Ambulatory Visit (INDEPENDENT_AMBULATORY_CARE_PROVIDER_SITE_OTHER): Admitting: Internal Medicine

## 2024-02-27 ENCOUNTER — Encounter: Payer: Self-pay | Admitting: Cardiovascular Disease

## 2024-02-27 VITALS — BP 134/60 | HR 65 | Temp 98.2°F | Ht 72.0 in | Wt 202.2 lb

## 2024-02-27 DIAGNOSIS — I10 Essential (primary) hypertension: Secondary | ICD-10-CM | POA: Diagnosis not present

## 2024-02-27 DIAGNOSIS — D649 Anemia, unspecified: Secondary | ICD-10-CM | POA: Diagnosis not present

## 2024-02-27 DIAGNOSIS — E039 Hypothyroidism, unspecified: Secondary | ICD-10-CM

## 2024-02-27 NOTE — Progress Notes (Signed)
 "  Established Patient Office Visit  Subjective:  Patient ID: Chase Mendez, male    DOB: 26-Mar-1934  Age: 89 y.o. MRN: [id number]  Chief Complaint  Patient presents with   Follow-up    Dr. Twylla advised pt to follow up with PCP due to lab results (Anemia)  Per pt requesting EKG as Phys needs before he can start on medication for depression    Visit for further evaluation of deteriorating anemia noted by urology. Also ECG requested by psych prior to initiation of Abilify. Denies bleeding from any orifices, bloody or melanotic stools.    No other concerns at this time.   Past Medical History:  Diagnosis Date   Acute encephalopathy 12/08/2014   Anxiety    ARF (acute renal failure) 12/08/2014   Back pain    Benign neoplasm of large bowel    Capsulitis    fractured displaced metatarsal with capsulitis   Chronic back pain    Coronavirus infection    Depression    Diabetes mellitus without complication (HCC)    no medications currently   Dysphagia    Exostosis    painful, right hallux   Foot drop, left    wears a brace   Frequent falls 02/2018   poor balance, foot drop   GERD (gastroesophageal reflux disease)    Gout    Hypertension    Hypothyroidism    Insomnia    Low testosterone     Microscopic hematuria 2016   Myocardial infarction [facility])    patient unaware when it happened years ago.     Pneumonia 12/07/2014   Pressure ulcer 12/09/2014   Sepsis (HCC) 12/08/2014   Thyroid  disease     Past Surgical History:  Procedure Laterality Date   APPENDECTOMY     BACK SURGERY  1995, 1996   x 2   CHOLECYSTECTOMY     ESOPHAGOGASTRODUODENOSCOPY (EGD) WITH PROPOFOL  N/A 04/24/2017   Procedure: ESOPHAGOGASTRODUODENOSCOPY (EGD) WITH PROPOFOL ;  Surgeon: Viktoria Lamar DASEN, MD;  Location: [facility] ENDOSCOPY;  Service: Endoscopy;  Laterality: N/A;   EYE SURGERY Bilateral 1983, 1985   cataract extractions   SPINAL CORD STIMULATOR INSERTION N/A 03/13/2018   Procedure: SPINAL CORD  STIMULATOR INSERTION;  Surgeon: Clois Fret, MD;  Location: [facility] ORS;  Service: Neurosurgery;  Laterality: N/A;    Social History   Socioeconomic History   Marital status: Married    Spouse name: dorothy   Number of children: 2   Years of education: Not on file   Highest education level: Some college, no degree  Occupational History   Occupation: maintenance / repair    Comment: disabled  Tobacco Use   Smoking status: Every Day    Types: Cigars   Smokeless tobacco: Never   Tobacco comments:    unable to give cessation materials due to webex visit   Vaping Use   Vaping status: Never Used  Substance and Sexual Activity   Alcohol use: No   Drug use: Yes    Types: Oxycodone    Sexual activity: Not Currently  Other Topics Concern   Not on file  Social History Narrative   Not on file   Social Drivers of Health   Tobacco Use: High Risk (02/25/2024)   Patient History    Smoking Tobacco Use: Every Day    Smokeless Tobacco Use: Never    Passive Exposure: Not on file  Financial Resource Strain: Low Risk  (01/22/2023)   Received from [facility]   Overall Financial  Resource Strain (CARDIA)    Difficulty of Paying Living Expenses: Not hard at all  Food Insecurity: No Food Insecurity (01/22/2023)   Received from [facility]   Epic    Within the past 12 months, you worried that your food would run out before you got the money to buy more.: Never true    Within the past 12 months, the food you bought just didn't last and you didn't have money to get more.: Never true  Transportation Needs: No Transportation Needs (07/17/2022)   PRAPARE - Administrator, Civil Service (Medical): No    Lack of Transportation (Non-Medical): No  Physical Activity: Not on file  Stress: Not on file  Social Connections: Not on file  Intimate Partner Violence: Not At Risk (07/17/2022)   Humiliation, Afraid, Rape, and Kick questionnaire    Fear of  Current or Ex-Partner: No    Emotionally Abused: No    Physically Abused: No    Sexually Abused: No  Depression (PHQ2-9): Medium Risk (02/25/2024)   Depression (PHQ2-9)    PHQ-2 Score: 10  Alcohol Screen: Not on file  Housing: Low Risk  (10/28/2023)   Received from [facility]   Epic    In the last 12 months, was there a time when you were not able to pay the mortgage or rent on time?: No    In the past 12 months, how many times have you moved where you were living?: 0    At any time in the past 12 months, were you homeless or living in a shelter (including now)?: No  Utilities: Not At Risk (01/22/2023)   Received from [facility] Utilities    Threatened with loss of utilities: No  Health Literacy: Not on file    Family History  Problem Relation Age of Onset   Heart disease Mother    Diabetes Father    Kidney cancer Brother     Allergies[1]  Show/hide medication list[2]  Review of Systems  Constitutional: Negative.   HENT: Negative.    Eyes:  Positive for blurred vision.  Cardiovascular: Negative.   Gastrointestinal:  Positive for nausea. Negative for blood in stool.  Genitourinary: Negative.   Musculoskeletal:  Positive for falls.  Skin: Negative.   Neurological: Negative.   Endo/Heme/Allergies: Negative.   Psychiatric/Behavioral:  Positive for depression and hallucinations. The patient is nervous/anxious.        Objective:   BP 134/60   Pulse 65   Temp 98.2 F (36.8 C)   Ht 6' (1.829 m)   Wt 202 lb 3.2 oz (91.7 kg)   SpO2 95%   BMI 27.42 kg/m   Vitals:   02/27/24 1032  BP: 134/60  Pulse: 65  Temp: 98.2 F (36.8 C)  Height: 6' (1.829 m)  Weight: 202 lb 3.2 oz (91.7 kg)  SpO2: 95%  BMI (Calculated): 27.42    Physical Exam Vitals reviewed.  Constitutional:      Appearance: Normal appearance.  HENT:     Head: Normocephalic.     Left Ear: There is no impacted cerumen.     Nose: Nose normal.      Mouth/Throat:     Mouth: Mucous membranes are dry.     Pharynx: No posterior oropharyngeal erythema.  Eyes:     Extraocular Movements: Extraocular movements intact.     Pupils: Pupils are equal, round, and reactive to light.  Cardiovascular:  Rate and Rhythm: Regular rhythm.     Chest Wall: PMI is not displaced.     Pulses: Normal pulses.     Heart sounds: Normal heart sounds. No murmur heard. Pulmonary:     Effort: Pulmonary effort is normal.     Breath sounds: Normal air entry. No rhonchi or rales.  Abdominal:     General: Abdomen is flat. Bowel sounds are normal. There is no distension.     Palpations: Abdomen is soft. There is no hepatomegaly, splenomegaly or mass.     Tenderness: There is no abdominal tenderness.  Musculoskeletal:        General: Normal range of motion.     Cervical back: Normal range of motion and neck supple.     Right lower leg: No edema.     Left lower leg: No edema.  Skin:    General: Skin is warm and dry.     Findings: Signs of injury (right ankle pressure injury) present.     Comments: Decreased skin turgor  Neurological:     General: No focal deficit present.     Mental Status: He is alert and oriented to person, place, and time.     Cranial Nerves: No cranial nerve deficit.     Motor: No weakness.  Psychiatric:        Mood and Affect: Mood normal.        Behavior: Behavior normal.      No results found for any visits on 02/27/24.  Recent Results (from the past 2160 hours)  TSH     Status: None   Collection Time: 12/24/23  9:07 AM  Result Value Ref Range   TSH 0.675 0.450 - 4.500 uIU/mL  Comprehensive metabolic panel with GFR     Status: Abnormal   Collection Time: 12/24/23  9:07 AM  Result Value Ref Range   Glucose 146 (H) 70 - 99 mg/dL   BUN 10 8 - 27 mg/dL   Creatinine, Ser 8.74 0.76 - 1.27 mg/dL   eGFR 56 (L) >40 fO/fpw/8.26   BUN/Creatinine Ratio 8 (L) 10 - 24   Sodium 144 134 - 144 mmol/L   Potassium 5.0 3.5 - 5.2 mmol/L    Chloride 106 96 - 106 mmol/L   CO2 23 20 - 29 mmol/L   Calcium  8.4 (L) 8.6 - 10.2 mg/dL   Total Protein 6.3 6.0 - 8.5 g/dL   Albumin 3.7 3.7 - 4.7 g/dL   Globulin, Total 2.6 1.5 - 4.5 g/dL   Bilirubin Total 0.7 0.0 - 1.2 mg/dL   Alkaline Phosphatase 140 (H) 48 - 129 IU/L   AST 23 0 - 40 IU/L   ALT 11 0 - 44 IU/L  Testosterone      Status: Abnormal   Collection Time: 01/11/24  9:38 AM  Result Value Ref Range   Testosterone  248 (L) 264 - 916 ng/dL    Comment: Adult male reference interval is based on a population of healthy nonobese males (BMI <30) between 31 and 53 years old. Travison, et.al. JCEM [phone]. PMID: 71675896.   Hemoglobin and hematocrit, blood     Status: Abnormal   Collection Time: 01/11/24  9:38 AM  Result Value Ref Range   Hemoglobin 11.6 (L) 13.0 - 17.7 g/dL   Hematocrit 60.4 62.4 - 51.0 %  Testosterone      Status: Abnormal   Collection Time: 02/13/24 10:57 AM  Result Value Ref Range   Testosterone  994 (H) 264 - 916 ng/dL  Comment: Adult male reference interval is based on a population of healthy nonobese males (BMI <30) between 67 and 65 years old. Travison, et.al. JCEM [phone]. PMID: 71675896.   Hemoglobin and hematocrit, blood     Status: Abnormal   Collection Time: 02/13/24 10:57 AM  Result Value Ref Range   Hemoglobin 10.4 (L) 13.0 - 17.7 g/dL   Hematocrit 66.6 (L) 62.4 - 51.0 %      Assessment & Plan:  Gianno was seen today for follow-up.  Anemia, unspecified type -     POC Hemoccult Bld/Stl (1-Cd Office Dx) -     Folate -     Iron, TIBC and Ferritin Panel -     Vitamin B12 -     CBC With Diff/Platelet  Primary hypertension  Acquired hypothyroidism    Problem List Items Addressed This Visit       Endocrine   Hypothyroidism   Other Visit Diagnoses       Anemia, unspecified type    -  Primary   Relevant Orders   POC Hemoccult Bld/Stl (1-Cd Office Dx)   Folate   Iron, TIBC and Ferritin Panel   Vitamin B12    CBC With Diff/Platelet     Primary hypertension           Return in about 2 weeks (around 03/12/2024).   Total time spent: 30 minutes. This time includes review of previous notes and results and patient face to face interaction during today'Saladin Petrelli visit.    Sherrill Cinderella Perry, MD  02/27/2024   This document may have been prepared by [facility] Voice Recognition software and as such may include unintentional dictation errors.     [1]  Allergies Allergen Reactions   Wellbutrin  [Bupropion ]     hallucinations   Doxycycline Nausea Only   Ibuprofen Nausea Only   Sulfa Antibiotics Nausea Only  [2]  Outpatient Medications Prior to Visit  Medication Sig   acetaminophen  (TYLENOL ) 500 MG tablet Take 500-1,000 mg by mouth 2 (two) times daily as needed for mild pain.   allopurinol  (ZYLOPRIM ) 100 MG tablet TAKE 1 TABLET BY MOUTH EVERY OTHER DAY   amLODipine  (NORVASC ) 5 MG tablet Take 2.5 mg by mouth daily. (Patient taking differently: Take 5 mg by mouth as needed.)   aspirin  81 MG tablet Take 81 mg by mouth daily.   atorvastatin  (LIPITOR) 10 MG tablet TAKE 1 TABLET BY MOUTH EVERYDAY AT BEDTIME   Cholecalciferol 50 MCG (2000 UT) TABS Take by mouth.   famotidine  (PEPCID ) 20 MG tablet TAKE 1 TABLET BY MOUTH TWICE A DAY   fluticasone  (FLONASE ) 50 MCG/ACT nasal spray Place 1-2 sprays into both nostrils daily as needed for allergies or rhinitis.   gabapentin  (NEURONTIN ) 600 MG tablet TAKE 1 TABLET BY MOUTH TWICE DAILY.   hydrOXYzine  (VISTARIL ) 25 MG capsule TAKE 1 CAPSULE BY MOUTH ONCE DAILY AS NEEDED FOR ANXIETY   isosorbide  mononitrate (IMDUR ) 30 MG 24 hr tablet TAKE 1 TABLET BY MOUTH EVERY DAY   levothyroxine  (SYNTHROID ) 150 MCG tablet Take 1 tablet (150 mcg total) by mouth daily.   losartan  (COZAAR ) 100 MG tablet TAKE 1 TABLET BY MOUTH EVERY DAY   mirabegron  ER (MYRBETRIQ ) 50 MG TB24 tablet TAKE ONE TABLET BY MOUTH ONE TIME DAILY   naloxone  (NARCAN ) nasal spray 4 mg/0.1 mL Place 1 spray into the  nose as needed (for opioid-induced respiratory depresssion). In case of emergency (overdose), spray once into each nostril. If no response within 3 minutes, repeat application  and call 911.   NEEDLE, DISP, 18 G (BD SAFETYGLIDE NEEDLE) 18G X 1-1/2 MISC Use this to Draw up with   ondansetron  (ZOFRAN -ODT) 4 MG disintegrating tablet TAKE 1 TABLET (4 MG TOTAL) BY MOUTH IN THE MORNING, AT NOON, IN THE EVENING, AND AT BEDTIME.   [START ON 03/02/2024] Oxycodone  HCl 10 MG TABS Take 1 tablet (10 mg total) by mouth every 6 (six) hours as needed. Must last 30 days.   [START ON 04/01/2024] Oxycodone  HCl 10 MG TABS Take 1 tablet (10 mg total) by mouth every 6 (six) hours as needed. Must last 30 days.   [START ON 05/01/2024] Oxycodone  HCl 10 MG TABS Take 1 tablet (10 mg total) by mouth every 6 (six) hours as needed. Must last 30 days.   SYRINGE-NEEDLE, DISP, 3 ML (B-D 3CC LUER-LOK SYR 21GX1-1/2) 21G X 1-1/2 3 ML MISC Use this needle to injection   testosterone  cypionate (DEPOTESTOSTERONE CYPIONATE) 200 MG/ML injection Inject 0.5 mLs (100 mg total) into the muscle every 14 (fourteen) days.   vitamin B-12 (CYANOCOBALAMIN ) 1000 MCG tablet Take 1,000 mcg by mouth daily.   Vitamin D, Ergocalciferol, (DRISDOL) 1.25 MG (50000 UNIT) CAPS capsule TAKE 1 CAPSULE BY MOUTH ONE TIME PER WEEK   vitamin E  400 UNIT capsule Take 400 Units by mouth daily.   No facility-administered medications prior to visit.   "

## 2024-02-28 ENCOUNTER — Telehealth: Payer: Self-pay | Admitting: Nurse Practitioner

## 2024-02-28 LAB — CBC WITH DIFF/PLATELET
Basophils Absolute: 0.1 x10E3/uL (ref 0.0–0.2)
Basos: 1 %
EOS (ABSOLUTE): 0.5 x10E3/uL — ABNORMAL HIGH (ref 0.0–0.4)
Eos: 8 %
Hematocrit: 38.5 % (ref 37.5–51.0)
Hemoglobin: 11.3 g/dL — ABNORMAL LOW (ref 13.0–17.7)
Immature Grans (Abs): 0 x10E3/uL (ref 0.0–0.1)
Immature Granulocytes: 0 %
Lymphocytes Absolute: 1.7 x10E3/uL (ref 0.7–3.1)
Lymphs: 28 %
MCH: 23.1 pg — ABNORMAL LOW (ref 26.6–33.0)
MCHC: 29.4 g/dL — ABNORMAL LOW (ref 31.5–35.7)
MCV: 79 fL (ref 79–97)
Monocytes Absolute: 0.8 x10E3/uL (ref 0.1–0.9)
Monocytes: 13 %
Neutrophils Absolute: 3 x10E3/uL (ref 1.4–7.0)
Neutrophils: 50 %
Platelets: 166 x10E3/uL (ref 150–450)
RBC: 4.9 x10E6/uL (ref 4.14–5.80)
RDW: 15 % (ref 11.6–15.4)
WBC: 6 x10E3/uL (ref 3.4–10.8)

## 2024-02-28 LAB — IRON,TIBC AND FERRITIN PANEL
Ferritin: 12 ng/mL — ABNORMAL LOW (ref 30–400)
Iron Saturation: 6 % — CL (ref 15–55)
Iron: 23 ug/dL — ABNORMAL LOW (ref 38–169)
Total Iron Binding Capacity: 408 ug/dL (ref 250–450)
UIBC: 385 ug/dL — ABNORMAL HIGH (ref 111–343)

## 2024-02-28 LAB — VITAMIN B12: Vitamin B-12: 1483 pg/mL — ABNORMAL HIGH (ref 232–1245)

## 2024-02-28 LAB — FOLATE: Folate: 19.1 ng/mL

## 2024-02-28 NOTE — Telephone Encounter (Signed)
 Returned patient phone call, I told her that I have reached out to [facility] to approve an early fill on oxycodone  1 day early.  Pharmacist states they will need a provider to call for approval.  I have sent message to her.    Patient told me that typically when a fill date falls on a Sunday that the pharmacy fills on that Saturday.  Patient does not understand why they won't go ahead and do that?    Told her I would call her back when I heard back from Seema.   Called back to [facility] and they are going to fill on Saturday 03/01/24 d/t being closed on Sunday.
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
## 2024-03-07 ENCOUNTER — Other Ambulatory Visit: Payer: Self-pay

## 2024-03-07 ENCOUNTER — Ambulatory Visit: Payer: Self-pay | Admitting: Internal Medicine

## 2024-03-07 DIAGNOSIS — K922 Gastrointestinal hemorrhage, unspecified: Secondary | ICD-10-CM

## 2024-03-07 DIAGNOSIS — D500 Iron deficiency anemia secondary to blood loss (chronic): Secondary | ICD-10-CM

## 2024-03-07 DIAGNOSIS — D649 Anemia, unspecified: Secondary | ICD-10-CM

## 2024-03-07 LAB — POC HEMOCCULT BLD/STL (HOME/3-CARD/SCREEN)
Card #2 Fecal Occult Blod, POC: POSITIVE
Card #3 Fecal Occult Blood, POC: POSITIVE
Fecal Occult Blood, POC: NEGATIVE

## 2024-03-11 ENCOUNTER — Other Ambulatory Visit: Payer: Self-pay | Admitting: Pain Medicine

## 2024-03-11 DIAGNOSIS — M961 Postlaminectomy syndrome, not elsewhere classified: Secondary | ICD-10-CM

## 2024-03-11 DIAGNOSIS — M79605 Pain in left leg: Secondary | ICD-10-CM

## 2024-03-11 DIAGNOSIS — Z9682 Presence of neurostimulator: Secondary | ICD-10-CM

## 2024-03-11 NOTE — Progress Notes (Signed)
 The patient has been having difficulties with recharging his spinal cord stimulator, but he still getting benefit from the device.  They have requested a battery replacement to a nonrechargeable unit.  We will be referring the patient to neurosurgery for the battery replacement.

## 2024-03-12 ENCOUNTER — Ambulatory Visit: Admitting: Internal Medicine

## 2024-03-12 ENCOUNTER — Emergency Department

## 2024-03-12 ENCOUNTER — Emergency Department
Admission: EM | Admit: 2024-03-12 | Discharge: 2024-03-13 | Disposition: A | Attending: Emergency Medicine | Admitting: Emergency Medicine

## 2024-03-12 ENCOUNTER — Encounter: Payer: Self-pay | Admitting: Emergency Medicine

## 2024-03-12 ENCOUNTER — Other Ambulatory Visit: Payer: Self-pay

## 2024-03-12 DIAGNOSIS — R0902 Hypoxemia: Secondary | ICD-10-CM

## 2024-03-12 DIAGNOSIS — J449 Chronic obstructive pulmonary disease, unspecified: Secondary | ICD-10-CM | POA: Insufficient documentation

## 2024-03-12 DIAGNOSIS — S31010A Laceration without foreign body of lower back and pelvis without penetration into retroperitoneum, initial encounter: Secondary | ICD-10-CM | POA: Insufficient documentation

## 2024-03-12 DIAGNOSIS — R062 Wheezing: Secondary | ICD-10-CM

## 2024-03-12 DIAGNOSIS — R7989 Other specified abnormal findings of blood chemistry: Secondary | ICD-10-CM | POA: Insufficient documentation

## 2024-03-12 DIAGNOSIS — Z23 Encounter for immunization: Secondary | ICD-10-CM | POA: Insufficient documentation

## 2024-03-12 DIAGNOSIS — T07XXXA Unspecified multiple injuries, initial encounter: Secondary | ICD-10-CM

## 2024-03-12 DIAGNOSIS — W01110A Fall on same level from slipping, tripping and stumbling with subsequent striking against sharp glass, initial encounter: Secondary | ICD-10-CM | POA: Insufficient documentation

## 2024-03-12 LAB — TROPONIN T, HIGH SENSITIVITY: Troponin T High Sensitivity: 31 ng/L — ABNORMAL HIGH (ref 0–19)

## 2024-03-12 LAB — BASIC METABOLIC PANEL WITH GFR
Anion gap: 9 (ref 5–15)
BUN: 17 mg/dL (ref 8–23)
CO2: 26 mmol/L (ref 22–32)
Calcium: 8.6 mg/dL — ABNORMAL LOW (ref 8.9–10.3)
Chloride: 108 mmol/L (ref 98–111)
Creatinine, Ser: 1.23 mg/dL (ref 0.61–1.24)
GFR, Estimated: 57 mL/min — ABNORMAL LOW
Glucose, Bld: 147 mg/dL — ABNORMAL HIGH (ref 70–99)
Potassium: 4.3 mmol/L (ref 3.5–5.1)
Sodium: 143 mmol/L (ref 135–145)

## 2024-03-12 LAB — CBC
HCT: 34.7 % — ABNORMAL LOW (ref 39.0–52.0)
Hemoglobin: 10.5 g/dL — ABNORMAL LOW (ref 13.0–17.0)
MCH: 22.7 pg — ABNORMAL LOW (ref 26.0–34.0)
MCHC: 30.3 g/dL (ref 30.0–36.0)
MCV: 74.9 fL — ABNORMAL LOW (ref 80.0–100.0)
Platelets: 150 10*3/uL (ref 150–400)
RBC: 4.63 MIL/uL (ref 4.22–5.81)
RDW: 16.2 % — ABNORMAL HIGH (ref 11.5–15.5)
WBC: 7.3 10*3/uL (ref 4.0–10.5)
nRBC: 0 % (ref 0.0–0.2)

## 2024-03-12 LAB — PRO BRAIN NATRIURETIC PEPTIDE: Pro Brain Natriuretic Peptide: 192 pg/mL

## 2024-03-12 MED ORDER — ONDANSETRON 4 MG PO TBDP
4.0000 mg | ORAL_TABLET | Freq: Once | ORAL | Status: AC
  Administered 2024-03-12: 4 mg via ORAL
  Filled 2024-03-12: qty 1

## 2024-03-12 MED ORDER — IPRATROPIUM-ALBUTEROL 0.5-2.5 (3) MG/3ML IN SOLN
9.0000 mL | Freq: Once | RESPIRATORY_TRACT | Status: AC
  Administered 2024-03-12: 9 mL via RESPIRATORY_TRACT
  Filled 2024-03-12: qty 9

## 2024-03-12 MED ORDER — PREDNISONE 20 MG PO TABS
40.0000 mg | ORAL_TABLET | Freq: Once | ORAL | Status: AC
  Administered 2024-03-12: 40 mg via ORAL
  Filled 2024-03-12: qty 2

## 2024-03-12 MED ORDER — OXYCODONE-ACETAMINOPHEN 5-325 MG PO TABS
2.0000 | ORAL_TABLET | Freq: Once | ORAL | Status: AC
  Administered 2024-03-12: 2 via ORAL
  Filled 2024-03-12: qty 2

## 2024-03-12 MED ORDER — LIDOCAINE HCL (PF) 1 % IJ SOLN
5.0000 mL | Freq: Once | INTRAMUSCULAR | Status: AC
  Administered 2024-03-12: 5 mL
  Filled 2024-03-12: qty 5

## 2024-03-12 MED ORDER — TETANUS-DIPHTH-ACELL PERTUSSIS 5-2-15.5 LF-MCG/0.5 IM SUSP
0.5000 mL | Freq: Once | INTRAMUSCULAR | Status: AC
  Administered 2024-03-12: 0.5 mL via INTRAMUSCULAR
  Filled 2024-03-12: qty 0.5

## 2024-03-12 MED ORDER — LIDOCAINE HCL 1 % IJ SOLN
INTRAMUSCULAR | Status: AC
  Filled 2024-03-12: qty 10

## 2024-03-12 NOTE — ED Triage Notes (Addendum)
 First nurse note: pt to ED [facility] for lacerations to back when fell on possible glass. Denies hitting head or LOC. No blood thinner use.

## 2024-03-12 NOTE — ED Provider Notes (Signed)
 11:00 PM  Assumed care at shift change.  Patient had a mechanical fall into a glass door.  Abrasions cleaned, laceration repaired.  History of COPD not on home oxygen.  Briefly hypoxic here but doing better and not currently on oxygen with sats of 97%.  First troponin minimally elevated.  Second pending.  If this is negative and patient continues to do well and does not have oxygen requirement, anticipate discharge home.  2:30 AM  Pt ambulated without difficulty.  Sats stayed between 94 to 100% on room air.  History of COPD.  Will discharge with steroid burst, albuterol  inhaler.  He denies any chest pain or shortness of breath.  First troponin slightly elevated but repeat is flat, downtrending slightly.  No chest pain here.  Discussed wound care instructions.  He has PCP follow-up already scheduled for next week.  Sutures will need to be removed in 7 to 10 days.  At this time, I do not feel there is any life-threatening condition present. I reviewed all nursing notes, vitals, pertinent previous records.  All lab and urine results, EKGs, imaging ordered have been independently reviewed and interpreted by myself.  I reviewed all available radiology reports from any imaging ordered this visit.  Based on my assessment, I feel the patient is safe to be discharged home without further emergent workup and can continue workup as an outpatient as needed. Discussed all findings, treatment plan as well as usual and customary return precautions.  They verbalize understanding and are comfortable with this plan.  Outpatient follow-up has been provided as needed.  All questions have been answered.    Petina Muraski, Josette SAILOR, DO 03/13/24 [phone]
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
## 2024-03-12 NOTE — Discharge Instructions (Addendum)
 Please make sure to keep the area of his laceration site clean and dry for 24 hours.  Please do not let him soak in any body of water until the sutures are removed.  You can use Vaseline over the wounds after 24 hours.  Please also apply sunscreen.  Please not use any bacitracin , Neosporin or hydrogen peroxide.  If you see any redness, purulent drainage, please return to be seen.  Otherwise he can follow-up with his primary care doctor in 7 to 10 days to get the sutures removed.

## 2024-03-12 NOTE — ED Triage Notes (Addendum)
 Pt presents with lacerations to back after fall through a storm door that was glass. Thinks he was cut on the metal frame of the door.  Bleeding controlled by fire on scene by applying pressure.  Pt is mildly hypoxic in triage, placed on 2L [place]

## 2024-03-13 LAB — TROPONIN T, HIGH SENSITIVITY: Troponin T High Sensitivity: 30 ng/L — ABNORMAL HIGH (ref 0–19)

## 2024-03-13 MED ORDER — PREDNISONE 20 MG PO TABS: 40.0000 mg | ORAL_TABLET | Freq: Every day | ORAL | 0 refills | Status: AC

## 2024-03-13 MED ORDER — ALBUTEROL SULFATE HFA 108 (90 BASE) MCG/ACT IN AERS: 2.0000 | INHALATION_SPRAY | RESPIRATORY_TRACT | 0 refills | Status: AC | PRN

## 2024-03-13 NOTE — ED Notes (Signed)
 Non stick dressing and abd pad applied to back over wound.

## 2024-03-17 ENCOUNTER — Ambulatory Visit: Admitting: Internal Medicine

## 2024-03-25 ENCOUNTER — Ambulatory Visit: Admitting: Neurosurgery

## 2024-04-04 ENCOUNTER — Ambulatory Visit: Admitting: Internal Medicine

## 2024-04-08 ENCOUNTER — Ambulatory Visit: Admitting: Psychiatry

## 2024-05-12 ENCOUNTER — Encounter: Admitting: Nurse Practitioner

## 2024-07-18 ENCOUNTER — Ambulatory Visit: Admitting: Urology
